# Patient Record
Sex: Male | Born: 1990
Health system: Southern US, Community
[De-identification: ages and names within clinical notes are randomized; demographics above are authoritative.]

## PROBLEM LIST (undated history)

## (undated) ENCOUNTER — Emergency Department (HOSPITAL_COMMUNITY): Admission: EM | Payer: BC Managed Care – PPO

## (undated) DIAGNOSIS — T8859XA Other complications of anesthesia, initial encounter: Secondary | ICD-10-CM

## (undated) DIAGNOSIS — L03113 Cellulitis of right upper limb: Secondary | ICD-10-CM

## (undated) DIAGNOSIS — L02419 Cutaneous abscess of limb, unspecified: Secondary | ICD-10-CM

## (undated) DIAGNOSIS — N200 Calculus of kidney: Secondary | ICD-10-CM

## (undated) DIAGNOSIS — J40 Bronchitis, not specified as acute or chronic: Secondary | ICD-10-CM

## (undated) HISTORY — PX: HAND SURGERY: SHX662

## (undated) HISTORY — PX: KIDNEY STONE SURGERY: SHX686

---
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
## 2011-08-12 ENCOUNTER — Encounter (HOSPITAL_COMMUNITY): Payer: Self-pay | Admitting: *Deleted

## 2011-08-12 ENCOUNTER — Emergency Department (HOSPITAL_COMMUNITY)
Admission: EM | Admit: 2011-08-12 | Discharge: 2011-08-12 | Disposition: A | Discharge status: Home / Self Care | Payer: BC Managed Care – PPO | Attending: Emergency Medicine | Admitting: Emergency Medicine

## 2011-08-12 DIAGNOSIS — R000 Tachycardia, unspecified: Secondary | ICD-10-CM

## 2011-08-12 DIAGNOSIS — J069 Acute upper respiratory infection, unspecified: Secondary | ICD-10-CM | POA: Insufficient documentation

## 2011-08-12 DIAGNOSIS — Z87891 Personal history of nicotine dependence: Secondary | ICD-10-CM | POA: Insufficient documentation

## 2011-08-12 MED ORDER — AZITHROMYCIN 250 MG PO TABS: 250.0000 mg | ORAL_TABLET | Freq: Every day | ORAL | Status: DC

## 2011-08-12 MED ORDER — ACETAMINOPHEN 500 MG PO TABS
1000.0000 mg | ORAL_TABLET | Freq: Once | ORAL | Status: AC
  Administered 2011-08-12: 1000 mg via ORAL
  Filled 2011-08-12: qty 2

## 2011-08-12 NOTE — ED Notes (Signed)
Pt reports productive cough, subjective fever, body aches, mild nausea, chest and nasal congestion since Friday. Pt provided with face mask.

## 2011-08-12 NOTE — ED Provider Notes (Signed)
History     CSN: [id number]  Arrival date & time 08/12/11  1158   First MD Initiated Contact with Patient 08/12/11 1355      Chief Complaint  Patient presents with  . Cough  . Generalized Body Aches  . Fever  . Nasal Congestion    (Consider location/radiation/quality/duration/timing/severity/associated sxs/prior treatment) Patient is a 20 y.o. male presenting with cough and fever. The history is provided by the patient.  Cough This is a new problem. The current episode started more than 2 days ago. The problem occurs constantly. The problem has been gradually worsening. The cough is non-productive. The maximum temperature recorded prior to his arrival was 101 to 101.9 F. Associated symptoms include chills, rhinorrhea, sore throat and myalgias. Pertinent negatives include no chest pain and no shortness of breath. He is a smoker.  Fever Primary symptoms of the febrile illness include fever, cough, nausea and myalgias. Primary symptoms do not include shortness of breath, abdominal pain, vomiting, dysuria or rash.    History reviewed. No pertinent past medical history.  History reviewed. No pertinent past surgical history.  History reviewed. No pertinent family history.  History  Substance Use Topics  . Smoking status: Former Games developer  . Smokeless tobacco: Not on file  . Alcohol Use: No      Review of Systems  Constitutional: Positive for fever and chills.  HENT: Positive for congestion, sore throat, rhinorrhea and sinus pressure. Negative for trouble swallowing.   Eyes: Negative for discharge.  Respiratory: Positive for cough. Negative for shortness of breath.   Cardiovascular: Negative for chest pain.  Gastrointestinal: Positive for nausea. Negative for vomiting and abdominal pain.  Genitourinary: Negative for dysuria.  Musculoskeletal: Positive for myalgias.  Skin: Negative for rash.    Allergies  Review of patient's allergies indicates no known allergies.  Home  Medications   Current Outpatient Rx  Name Route Sig Dispense Refill  . ALBUTEROL SULFATE HFA 108 (90 BASE) MCG/ACT IN AERS Inhalation Inhale 2 puffs into the lungs every 6 (six) hours as needed.    . GUAIFENESIN ER 600 MG PO TB12 Oral Take 1,200 mg by mouth 2 (two) times daily.    Marland Kitchen HALLS COUGH DROPS MT LOZG Mouth/Throat Use as directed 20 lozenges in the mouth or throat as needed. For cough      BP 134/79  Pulse 129  Temp(Src) 100.3 F (37.9 C) (Oral)  Resp 18  SpO2 99%  Physical Exam  Constitutional: He appears well-developed and well-nourished.  HENT:  Head: Normocephalic.  Right Ear: External ear normal.  Left Ear: External ear normal.  Nose: Mucosal edema present. Right sinus exhibits frontal sinus tenderness. Left sinus exhibits frontal sinus tenderness.  Mouth/Throat: Oropharynx is clear and moist.  Neck: Normal range of motion. Neck supple.  Cardiovascular: Normal rate and normal heart sounds.   No murmur heard. Pulmonary/Chest: Effort normal and breath sounds normal. He has no wheezes. He has no rales.  Abdominal: Soft. Bowel sounds are normal. He exhibits no distension. There is no tenderness.  Musculoskeletal: Normal range of motion.  Lymphadenopathy:    He has no cervical adenopathy.  Skin: Skin is warm and dry. No pallor.    ED Course  Procedures (including critical care time)  Labs Reviewed - No data to display No results found.   No diagnosis found. 1. URI   MDM  He has persistent tachycardia despite that he is eating and drinking in the room. No history of vomiting, or diarrhea and is  not complaining of pain. There is no chest pain or shortness of breath - doubt PE. He is otherwise healthy without risk factors. Feel elevated heart rate is likely due to low grade temperature without other concern for underlying conditions.      Rodena Medin, PA-C 08/12/11 1535

## 2011-08-12 NOTE — ED Notes (Signed)
Pt c/o weakness, nasal congestion, nausea, dry cough, and chills that started Friday.  Pt denies vomiting and is able to keep down fluids and food.

## 2011-08-12 NOTE — Discharge Instructions (Signed)
RETURN HERE WITH ANY NEW OR WORSENING SYMPTOMS. TAKE ANTIBIOTIC UNTIL GONE. PUSH FLUIDS.   Upper Respiratory Infection, Adult An upper respiratory infection (URI) is also known as the common cold. It is often caused by a type of germ (virus). Colds are easily spread (contagious). You can pass it to others by kissing, coughing, sneezing, or drinking out of the same glass. Usually, you get better in 1 or 2 weeks.  HOME CARE   Only take medicine as told by your doctor.   Use a warm mist humidifier or breathe in steam from a hot shower.   Drink enough water and fluids to keep your pee (urine) clear or pale yellow.   Get plenty of rest.   Return to work when your temperature is back to normal or as told by your doctor. You may use a face mask and wash your hands to stop your cold from spreading.  GET HELP RIGHT AWAY IF:   After the first few days, you feel you are getting worse.   You have questions about your medicine.   You have chills, shortness of breath, or brown or red spit (mucus).   You have yellow or brown snot (nasal discharge) or pain in the face, especially when you bend forward.   You have a fever, puffy (swollen) neck, pain when you swallow, or white spots in the back of your throat.   You have a bad headache, ear pain, sinus pain, or chest pain.   You have a high-pitched whistling sound when you breathe in and out (wheezing).   You have a lasting cough or cough up blood.   You have sore muscles or a stiff neck.  MAKE SURE YOU:   Understand these instructions.   Will watch your condition.   Will get help right away if you are not doing well or get worse.  Document Released: 10/23/2007 Document Revised: 04/25/2011 Document Reviewed: 09/10/2010 [facility] Patient Information 2012 [place], [place].

## 2011-08-12 NOTE — ED Provider Notes (Signed)
Medical screening examination/treatment/procedure(s) were performed by non-physician practitioner and as supervising physician I was immediately available for consultation/collaboration.  Cheri Guppy, MD 08/12/11 [phone]

## 2011-08-13 ENCOUNTER — Encounter (HOSPITAL_COMMUNITY): Payer: Self-pay | Admitting: *Deleted

## 2011-08-13 ENCOUNTER — Other Ambulatory Visit: Payer: Self-pay

## 2011-08-13 ENCOUNTER — Emergency Department (HOSPITAL_COMMUNITY)
Admission: EM | Admit: 2011-08-13 | Discharge: 2011-08-13 | Disposition: A | Discharge status: Home / Self Care | Payer: BC Managed Care – PPO | Attending: Emergency Medicine | Admitting: Emergency Medicine

## 2011-08-13 DIAGNOSIS — J069 Acute upper respiratory infection, unspecified: Secondary | ICD-10-CM | POA: Insufficient documentation

## 2011-08-13 DIAGNOSIS — Z87891 Personal history of nicotine dependence: Secondary | ICD-10-CM | POA: Insufficient documentation

## 2011-08-13 DIAGNOSIS — J40 Bronchitis, not specified as acute or chronic: Secondary | ICD-10-CM | POA: Insufficient documentation

## 2011-08-13 MED ORDER — HYDROCOD POLST-CHLORPHEN POLST 10-8 MG/5ML PO LQCR: 5.0000 mL | Freq: Two times a day (BID) | ORAL | Status: DC

## 2011-08-13 NOTE — ED Notes (Signed)
Pt states "seen here yesterday & told had an URI, today I started having chest pain & dizziness"; pt indicates cp is mid-sternal

## 2011-08-13 NOTE — Discharge Instructions (Signed)
Bronchitis Bronchitis is a problem of the air tubes leading to your lungs. This problem makes it hard for air to get in and out of the lungs. You may cough a lot because your air tubes are narrow. Going without care can cause lasting (chronic) bronchitis. HOME CARE   Drink enough fluids to keep your pee (urine) clear or pale yellow.   Use a cool mist humidifier.   Quit smoking if you smoke. If you keep smoking, the bronchitis might not get better.   Only take medicine as told by your doctor.  GET HELP RIGHT AWAY IF:   Coughing keeps you awake.   You start to wheeze.   You become more sick or weak.   You have a hard time breathing or get short of breath.   You cough up blood.   Coughing lasts more than 2 weeks.   You have a fever.   Your baby is older than 3 months with a rectal temperature of 102 F (38.9 C) or higher.   Your baby is 59 months old or younger with a rectal temperature of 100.4 F (38 C) or higher.  MAKE SURE YOU:  Understand these instructions.   Will watch your condition.   Will get help right away if you are not doing well or get worse.  Document Released: 10/23/2007 Document Revised: 04/25/2011 Document Reviewed: 04/07/2009 [facility] Patient Information 2012 [place], [place]. Please continue to use the azithromycin prescribed yesterday.  You have an inhaler for the next 2 days.  Would like you to use the inhaler every 4-6 hours 2 puffs around-the-clock if he wake during the night with a coughing episode.  Use it at that time, but did not [place] along clock to wake during the night.  If not needed after the 2 days of regular albuterol use as needed, you also can help with her congestion.  By using Vicks and boiling water to breathing in the vapors is where the saline nose drops

## 2011-08-13 NOTE — ED Provider Notes (Signed)
History     CSN: [id number]  Arrival date & time 08/13/11  1337   First MD Initiated Contact with Patient 08/13/11 1625      Chief Complaint  Patient presents with  . Cough  . Dizziness  . Chest Pain    (Consider location/radiation/quality/duration/timing/severity/associated sxs/prior treatment) HPI Comments: Patient was seen yesterday diagnosed with URI and bronchitis.  He is feels his azithromycin prescription, but is still having a nonproductive cough and today.  He is having more nasal congestion, and dizziness.  He does have an inhaler at her, E. he's in the past for episodes of bronchitis.  He, states she's used it intermittently, but not on regular basis.  He states that when he coughs.  He has pain in his rib area  Patient is a 21 y.o. male presenting with cough and chest pain. The history is provided by the patient.  Cough This is a recurrent problem. The current episode started yesterday. The problem occurs constantly. The problem has not changed since onset.The cough is non-productive. There has been no fever. Associated symptoms include chest pain and rhinorrhea. Pertinent negatives include no ear congestion, no headaches, no shortness of breath and no wheezing.  Chest Pain Primary symptoms include cough and dizziness. Pertinent negatives for primary symptoms include no fever, no shortness of breath and no wheezing.     History reviewed. No pertinent past medical history.  History reviewed. No pertinent past surgical history.  No family history on file.  History  Substance Use Topics  . Smoking status: Former Games developer  . Smokeless tobacco: Not on file  . Alcohol Use: No      Review of Systems  Constitutional: Negative for fever.  HENT: Positive for congestion, rhinorrhea and postnasal drip.   Respiratory: Positive for cough. Negative for shortness of breath and wheezing.   Cardiovascular: Positive for chest pain.  Neurological: Positive for dizziness. Negative  for headaches.    Allergies  Review of patient's allergies indicates no known allergies.  Home Medications   Current Outpatient Rx  Name Route Sig Dispense Refill  . ACETAMINOPHEN 500 MG PO TABS Oral Take 500 mg by mouth every 6 (six) hours as needed. For fever.    . ALBUTEROL SULFATE HFA 108 (90 BASE) MCG/ACT IN AERS Inhalation Inhale 2 puffs into the lungs every 6 (six) hours as needed. For shortness of breath.    . AZITHROMYCIN 250 MG PO TABS Oral Take 250 mg by mouth daily. He is taking for 5 days. He started on 08/12/11 and has not completed.    . GUAIFENESIN ER 600 MG PO TB12 Oral Take 1,200 mg by mouth 2 (two) times daily.    Marland Kitchen HYDROCOD POLST-CPM POLST ER 10-8 MG/5ML PO LQCR Oral Take 5 mLs by mouth every 12 (twelve) hours. 60 mL 0    BP 121/66  Pulse 113  Temp(Src) 99.1 F (37.3 C) (Oral)  Resp 18  Wt 260 lb (117.935 kg)  SpO2 98%  Physical Exam  Constitutional: He is oriented to person, place, and time. He appears well-developed and well-nourished.  HENT:  Head: Normocephalic.  Nose: Rhinorrhea present. No sinus tenderness. Right sinus exhibits maxillary sinus tenderness and frontal sinus tenderness. Left sinus exhibits maxillary sinus tenderness and frontal sinus tenderness.  Mouth/Throat: Uvula is midline.  Eyes: Pupils are equal, round, and reactive to light.  Neck: Normal range of motion.  Cardiovascular: Tachycardia present.   Pulmonary/Chest: No respiratory distress. He has no wheezes. He exhibits tenderness.  Abdominal:  Soft. He exhibits no distension. There is no tenderness.  Musculoskeletal: Normal range of motion.  Neurological: He is alert and oriented to person, place, and time.  Skin: Skin is warm and dry.    ED Course  Procedures (including critical care time)  Labs Reviewed - No data to display No results found.   1. Bronchitis     Patient is requesting something for the cough that is keeping him from sleep  MDM  URI with bronchitis, sinus  congestion        Arman Filter, NP 08/13/11 1655  Arman Filter, NP 08/13/11 1656

## 2011-08-14 NOTE — ED Provider Notes (Signed)
Medical screening examination/treatment/procedure(s) were performed by non-physician practitioner and as supervising physician I was immediately available for consultation/collaboration.  Raeford Razor, MD 08/14/11 0010

## 2012-03-29 ENCOUNTER — Emergency Department (HOSPITAL_COMMUNITY): Payer: Self-pay

## 2012-03-29 ENCOUNTER — Emergency Department (HOSPITAL_COMMUNITY)
Admission: EM | Admit: 2012-03-29 | Discharge: 2012-03-29 | Disposition: A | Discharge status: Home / Self Care | Payer: Self-pay | Attending: Emergency Medicine | Admitting: Emergency Medicine

## 2012-03-29 ENCOUNTER — Encounter (HOSPITAL_COMMUNITY): Payer: Self-pay

## 2012-03-29 DIAGNOSIS — J029 Acute pharyngitis, unspecified: Secondary | ICD-10-CM | POA: Insufficient documentation

## 2012-03-29 DIAGNOSIS — Z87891 Personal history of nicotine dependence: Secondary | ICD-10-CM | POA: Insufficient documentation

## 2012-03-29 DIAGNOSIS — J40 Bronchitis, not specified as acute or chronic: Secondary | ICD-10-CM | POA: Insufficient documentation

## 2012-03-29 DIAGNOSIS — Z79899 Other long term (current) drug therapy: Secondary | ICD-10-CM | POA: Insufficient documentation

## 2012-03-29 MED ORDER — PREDNISONE 20 MG PO TABS: 40.0000 mg | ORAL_TABLET | Freq: Every day | ORAL | Status: DC

## 2012-03-29 MED ORDER — ALBUTEROL SULFATE HFA 108 (90 BASE) MCG/ACT IN AERS
2.0000 | INHALATION_SPRAY | RESPIRATORY_TRACT | Status: DC | PRN
  Administered 2012-03-29: 2 via RESPIRATORY_TRACT
  Filled 2012-03-29: qty 6.7

## 2012-03-29 NOTE — ED Notes (Signed)
Pt. Has had nasal congestion, productive white cough, body aches for 5 days.  Pt . Has a hx of bronchitis

## 2012-03-29 NOTE — ED Provider Notes (Signed)
History  This chart was scribed for Gwyneth Sprout, MD by Erskine Emery. This patient was seen in room TR05C/TR05C and the patient's care was started at 15:46.   CSN: [id number]  Arrival date & time 03/29/12  1508   First MD Initiated Contact with Patient 03/29/12 1546      Chief Complaint  Patient presents with  . Cough    (Consider location/radiation/quality/duration/timing/severity/associated sxs/prior treatment) The history is provided by the patient. No language interpreter was used.  Jacob Nixon is a 21 y.o. male who presents to the Emergency Department complaining of a productive cough, intermittent SOB, difficulty breathing, and cold symptoms for the past 4 days. Pt denies any associated fever. Pt reports a h/o bronchitis and reports this episode feels similar. In the last episode of bronchitis the pt was successfully treated with an inhaler. Pt has no h/o asthma. Pt smokes cigarettes.  History reviewed. No pertinent past medical history.  Past Surgical History  Procedure Date  . Kidney stone surgery     lithortripsy    No family history on file.  History  Substance Use Topics  . Smoking status: Former Games developer  . Smokeless tobacco: Not on file  . Alcohol Use: No      Review of Systems  Constitutional: Negative for fever and chills.  HENT: Positive for sore throat and rhinorrhea.   Respiratory: Positive for cough (productive) and shortness of breath.   Gastrointestinal: Negative for nausea and vomiting.  Genitourinary: Negative for hematuria.  Musculoskeletal: Negative for back pain.  Skin: Negative for rash.  Neurological: Negative for weakness.    Allergies  Review of patient's allergies indicates no known allergies.  Home Medications   Current Outpatient Rx  Name  Route  Sig  Dispense  Refill  . ALBUTEROL SULFATE HFA 108 (90 BASE) MCG/ACT IN AERS   Inhalation   Inhale 2 puffs into the lungs every 6 (six) hours as needed. For shortness of  breath.         . GUAIFENESIN ER 600 MG PO TB12   Oral   Take 600 mg by mouth 2 (two) times daily.          Ronney Asters COLD/FLU SEVERE DAY PO   Oral   Take 2 capsules by mouth every 6 (six) hours as needed. For congestion, coughing and fever           BP 129/77  Pulse 109  Temp 99.5 F (37.5 C) (Oral)  Resp 22  Ht 5\' 11"  (1.803 m)  Wt 260 lb (117.935 kg)  BMI 36.26 kg/m2  SpO2 96%  Physical Exam  Nursing note and vitals reviewed. Constitutional: He is oriented to person, place, and time. He appears well-developed and well-nourished. No distress.  HENT:  Head: Normocephalic and atraumatic.  Right Ear: Tympanic membrane and external ear normal.  Left Ear: Tympanic membrane and external ear normal.  Mouth/Throat: Oropharynx is clear and moist. No oropharyngeal exudate.  Eyes: EOM are normal. Pupils are equal, round, and reactive to light.  Neck: Neck supple. No tracheal deviation present.  Cardiovascular: Normal rate.   Pulmonary/Chest: Effort normal. No respiratory distress.  Abdominal: Soft. He exhibits no distension.  Musculoskeletal: Normal range of motion. He exhibits no edema.  Neurological: He is alert and oriented to person, place, and time.  Skin: Skin is warm and dry.  Psychiatric: He has a normal mood and affect.    ED Course  Procedures (including critical care time) DIAGNOSTIC STUDIES:.  COORDINATION OF CARE: 16:04--I evaluated the patient and we discussed a treatment plan including inhaler and steroids to which the pt agreed. I notified the pt that his x-ray shows he has bronchitis again.   Labs Reviewed - No data to display Dg Chest 2 View  03/29/2012  *RADIOLOGY REPORT*  Clinical Data: Cough.  Congestion.  Shortness of breath.  CHEST - 2 VIEW  Comparison: None.  Findings: Heart size is normal.  Mediastinal shadows are normal. There may be mild central bronchial thickening but there is no infiltrate, collapse or effusion.  No bony abnormality.   IMPRESSION: Possible bronchitis.  No consolidation or collapse.   Original Report Authenticated By: Paulina Fusi, M.D.      1. Bronchitis       MDM   Pt with symptoms consistent with viral URI/bronchitis.  Well appearing here.  No signs of breathing difficulty here but pt states wheezing and SOB intermittently and he is a smoker.  No signs of pharyngitis, otitis or abnormal abdominal findings.   CXR shows bronchitis.  Will treat with steroids and inhaler and have pt return if sx worsening.       I personally performed the services described in this documentation, which was scribed in my presence.  The recorded information has been reviewed and considered.    Gwyneth Sprout, MD 03/29/12 1616

## 2012-04-03 ENCOUNTER — Encounter (HOSPITAL_COMMUNITY): Payer: Self-pay | Admitting: Emergency Medicine

## 2012-04-03 ENCOUNTER — Emergency Department (HOSPITAL_COMMUNITY)
Admission: EM | Admit: 2012-04-03 | Discharge: 2012-04-03 | Disposition: A | Discharge status: Home / Self Care | Payer: Self-pay | Attending: Emergency Medicine | Admitting: Emergency Medicine

## 2012-04-03 DIAGNOSIS — Z79899 Other long term (current) drug therapy: Secondary | ICD-10-CM | POA: Insufficient documentation

## 2012-04-03 DIAGNOSIS — R0602 Shortness of breath: Secondary | ICD-10-CM | POA: Insufficient documentation

## 2012-04-03 DIAGNOSIS — J45909 Unspecified asthma, uncomplicated: Secondary | ICD-10-CM

## 2012-04-03 DIAGNOSIS — Z87891 Personal history of nicotine dependence: Secondary | ICD-10-CM | POA: Insufficient documentation

## 2012-04-03 DIAGNOSIS — IMO0002 Reserved for concepts with insufficient information to code with codable children: Secondary | ICD-10-CM | POA: Insufficient documentation

## 2012-04-03 DIAGNOSIS — R0789 Other chest pain: Secondary | ICD-10-CM | POA: Insufficient documentation

## 2012-04-03 DIAGNOSIS — J40 Bronchitis, not specified as acute or chronic: Secondary | ICD-10-CM | POA: Insufficient documentation

## 2012-04-03 DIAGNOSIS — J45901 Unspecified asthma with (acute) exacerbation: Secondary | ICD-10-CM | POA: Insufficient documentation

## 2012-04-03 LAB — BASIC METABOLIC PANEL
CO2: 23 mEq/L (ref 19–32)
Calcium: 9.4 mg/dL (ref 8.4–10.5)
Chloride: 103 mEq/L (ref 96–112)
Glucose, Bld: 102 mg/dL — ABNORMAL HIGH (ref 70–99)
Potassium: 4 mEq/L (ref 3.5–5.1)
Sodium: 137 mEq/L (ref 135–145)

## 2012-04-03 LAB — D-DIMER, QUANTITATIVE: D-Dimer, Quant: <0.27 ug/mL-FEU (ref 0.00–0.48)

## 2012-04-03 LAB — CBC WITH DIFFERENTIAL/PLATELET
Basophils Absolute: 0 10*3/uL (ref 0.0–0.1)
Eosinophils Relative: 3 % (ref 0–5)
HCT: 42.2 % (ref 39.0–52.0)
Lymphocytes Relative: 35 % (ref 12–46)
Lymphs Abs: 4.1 10*3/uL — ABNORMAL HIGH (ref 0.7–4.0)
MCV: 84.2 fL (ref 78.0–100.0)
Neutro Abs: 6.3 10*3/uL (ref 1.7–7.7)
Platelets: 324 10*3/uL (ref 150–400)
RBC: 5.01 MIL/uL (ref 4.22–5.81)
WBC: 11.7 10*3/uL — ABNORMAL HIGH (ref 4.0–10.5)

## 2012-04-03 LAB — TROPONIN I: Troponin I: <0.3 ng/mL (ref <0.30)

## 2012-04-03 MED ORDER — ALBUTEROL SULFATE HFA 108 (90 BASE) MCG/ACT IN AERS
2.0000 | INHALATION_SPRAY | Freq: Once | RESPIRATORY_TRACT | Status: AC
  Administered 2012-04-03: 2 via RESPIRATORY_TRACT
  Filled 2012-04-03: qty 6.7

## 2012-04-03 MED ORDER — IPRATROPIUM BROMIDE 0.02 % IN SOLN
0.5000 mg | RESPIRATORY_TRACT | Status: DC
  Administered 2012-04-03: 0.5 mg via RESPIRATORY_TRACT
  Filled 2012-04-03: qty 2.5

## 2012-04-03 MED ORDER — ALBUTEROL SULFATE (5 MG/ML) 0.5% IN NEBU
2.5000 mg | INHALATION_SOLUTION | RESPIRATORY_TRACT | Status: DC
  Administered 2012-04-03: 2.5 mg via RESPIRATORY_TRACT
  Filled 2012-04-03: qty 0.5

## 2012-04-03 MED ORDER — HYDROCODONE-HOMATROPINE 5-1.5 MG/5ML PO SYRP: 5.0000 mL | ORAL_SOLUTION | Freq: Four times a day (QID) | ORAL | Status: DC | PRN

## 2012-04-03 NOTE — ED Notes (Signed)
Patient is alert and orientedx4.  Patient was explained discharge instructions and he had no questions.   

## 2012-04-03 NOTE — ED Notes (Signed)
Patient advised he was here last week and was diagnosed with bronchitis.  He was given an inhaler and he is out.  Patient says he feels like he cannot take a full deep breath.  Patient does sound congested but says he has a dry cough.  Patient also said his left arm feels like it is "falling asleep".  Patient says he googled information on the symptoms he feels in his arm and it concerned him that some signs of stroke are similar to what he is feeling in his left arm.

## 2012-04-03 NOTE — ED Notes (Signed)
Pt. Was in ED on Sunday given albuterol and prednisone for bronchitis symptoms.  Pt. Doesn't feel that medication has helped.  Medication has run out and cough and breathing symptoms still remain. Pt has nasal congestion and cough.  Denies production with cough.

## 2012-04-05 NOTE — ED Provider Notes (Signed)
History     CSN: [id number]  Arrival date & time 04/03/12  4401   First MD Initiated Contact with Patient 04/03/12 2007      Chief Complaint  Patient presents with  . Cough  . Shortness of Breath    (Consider location/radiation/quality/duration/timing/severity/associated sxs/prior treatment) HPI Comments: Jacob Nixon 21 y.o. male   The chief complaint is: Patient presents with:   Cough   Shortness of Breath    21 year old male presents with c/o shortness of breath and nonproductive cough.  He was seen 5 days ago. cxr showed bronchitis.  Tonight c/o left-sided chest pressure and a nagging pain in his left shoulder.  He complains of left hand paresthesias.Onset yesterday morning and constant; no pain, nausea, vomiting,or diaphoresis. Denies history of pulmonary embolism or DVT.  Denies history of neck pain or radiculopathy . Patient is a current smoker (Hookah).  Denies a family history of early MI or stroke.  He denies history of diabetes.  Patient is obese.  Given an inhaler and prednisone at last visit w/o relief.  He has used the entire inhaler in 4 days.  Awakens with cough, wheezing frequently at night;     Denies fevers, chills, myalgias, arthralgias. Denies dysuria, flank pain, suprapubic pain, frequency, urgency, or hematuria. Denies headaches, light headedness, weakness, visual disturbances. Denies abdominal pain, nausea, vomiting, diarrhea or constipation.       The history is provided by the patient. No language interpreter was used.    History reviewed. No pertinent past medical history.  Past Surgical History  Procedure Date  . Kidney stone surgery     lithortripsy    No family history on file.  History  Substance Use Topics  . Smoking status: Former Games developer  . Smokeless tobacco: Not on file  . Alcohol Use: No      Review of Systems  All other systems reviewed and are negative.  except as stated in HPI.  Allergies  Review of patient's  allergies indicates no known allergies.  Home Medications   Current Outpatient Rx  Name  Route  Sig  Dispense  Refill  . ALBUTEROL SULFATE HFA 108 (90 BASE) MCG/ACT IN AERS   Inhalation   Inhale 2 puffs into the lungs every 6 (six) hours as needed. For shortness of breath.         . IBUPROFEN 200 MG PO TABS   Oral   Take 600 mg by mouth every 6 (six) hours as needed. For pain         . PREDNISONE 20 MG PO TABS   Oral   Take 2 tablets (40 mg total) by mouth daily.   10 tablet   0     BP 127/68  Pulse 72  Temp 97.9 F (36.6 C) (Oral)  Resp 18  SpO2 93%  Physical Exam  Nursing note and vitals reviewed. Constitutional: He appears well-developed and well-nourished. No distress.  HENT:  Head: Normocephalic and atraumatic.  Eyes: Conjunctivae normal are normal. No scleral icterus.  Neck: Normal range of motion. Neck supple.  Cardiovascular: Normal rate, regular rhythm and normal heart sounds.   Pulmonary/Chest: Effort normal. No respiratory distress. He has wheezes (mild expiratory wheezes).  Abdominal: Soft. There is no tenderness.  Musculoskeletal: He exhibits no edema.  Neurological: He is alert.  Skin: Skin is warm and dry. He is not diaphoretic.  Psychiatric: His behavior is normal.    ED Course  Procedures (including critical care time)  Labs Reviewed -  No data to display No results found.   No diagnosis found.   Date: 04/03/2012  Rate: 77  Rhythm: normal sinus rhythm  QRS Axis: normal  Intervals: normal  ST/T Wave abnormalities: normal  Conduction Disutrbances: none  Narrative Interpretation:   Old EKG Reviewed: none     MDM  Patient's complaints are concerning for ACS versus PE.  However my suspicion is very low considering age and RFs.  I suspect that the patient likely has tightness from reactive airway and bronchitis.  He is on the monitor currently. Normal ECG.  Awaiting troponin I and D. Dimer.   Patient d dimer and troponin Negative.   As onset of sxs was greater than 12 hours I do not feel repeat troponin necessary.  Labs also show mild hyperglycemia.  I will dc paitent with Hycodan syrup and albuterol inhaler.   Discussed reasons to seek immediate care. Patient expresses understanding and agrees with plan.     Arthor Captain, PA-C 04/05/12 1350

## 2012-04-05 NOTE — ED Provider Notes (Signed)
Medical screening examination/treatment/procedure(s) were performed by non-physician practitioner and as supervising physician I was immediately available for consultation/collaboration.   Glynn Octave, MD 04/05/12 2329

## 2012-06-19 ENCOUNTER — Emergency Department (HOSPITAL_COMMUNITY)
Admission: EM | Admit: 2012-06-19 | Discharge: 2012-06-19 | Disposition: A | Discharge status: Home / Self Care | Payer: BC Managed Care – PPO | Attending: Emergency Medicine | Admitting: Emergency Medicine

## 2012-06-19 ENCOUNTER — Encounter (HOSPITAL_COMMUNITY): Payer: Self-pay | Admitting: *Deleted

## 2012-06-19 ENCOUNTER — Emergency Department (HOSPITAL_COMMUNITY): Payer: BC Managed Care – PPO

## 2012-06-19 DIAGNOSIS — R10A0 Flank pain, unspecified side: Secondary | ICD-10-CM

## 2012-06-19 DIAGNOSIS — Z87891 Personal history of nicotine dependence: Secondary | ICD-10-CM | POA: Insufficient documentation

## 2012-06-19 DIAGNOSIS — R110 Nausea: Secondary | ICD-10-CM | POA: Insufficient documentation

## 2012-06-19 DIAGNOSIS — N23 Unspecified renal colic: Secondary | ICD-10-CM

## 2012-06-19 DIAGNOSIS — R109 Unspecified abdominal pain: Secondary | ICD-10-CM | POA: Insufficient documentation

## 2012-06-19 DIAGNOSIS — Z87442 Personal history of urinary calculi: Secondary | ICD-10-CM | POA: Insufficient documentation

## 2012-06-19 HISTORY — DX: Calculus of kidney: N20.0

## 2012-06-19 LAB — POCT I-STAT, CHEM 8
Chloride: 102 mEq/L (ref 96–112)
Glucose, Bld: 117 mg/dL — ABNORMAL HIGH (ref 70–99)
HCT: 47 % (ref 39.0–52.0)
Hemoglobin: 16 g/dL (ref 13.0–17.0)
Potassium: 3.5 mEq/L (ref 3.5–5.1)
Sodium: 139 mEq/L (ref 135–145)

## 2012-06-19 LAB — URINALYSIS, ROUTINE W REFLEX MICROSCOPIC
Leukocytes, UA: NEGATIVE
Nitrite: NEGATIVE
Specific Gravity, Urine: 1.022 (ref 1.005–1.030)
Urobilinogen, UA: 1 mg/dL (ref 0.0–1.0)
pH: 6 (ref 5.0–8.0)

## 2012-06-19 MED ORDER — OXYCODONE-ACETAMINOPHEN 5-325 MG PO TABS
2.0000 | ORAL_TABLET | Freq: Once | ORAL | Status: AC
  Administered 2012-06-19: 2 via ORAL
  Filled 2012-06-19: qty 2

## 2012-06-19 MED ORDER — IBUPROFEN 800 MG PO TABS: 800.0000 mg | ORAL_TABLET | Freq: Three times a day (TID) | ORAL | Status: DC | PRN

## 2012-06-19 MED ORDER — KETOROLAC TROMETHAMINE 30 MG/ML IJ SOLN
30.0000 mg | Freq: Once | INTRAMUSCULAR | Status: AC
  Administered 2012-06-19: 30 mg via INTRAMUSCULAR
  Filled 2012-06-19: qty 1

## 2012-06-19 NOTE — ED Notes (Signed)
Per pt, approx a week ago pt started having left flank pain, and pain started to radiate around his left side. Pt states that the pain has gotten worse. Pt states he has a hx of Kidney stones and this is the same type of pain as his previous stones.

## 2012-06-19 NOTE — ED Notes (Addendum)
Lt. Flank pain and radiates around to belly.  Urine darker than normal

## 2012-06-19 NOTE — ED Notes (Signed)
Pt ambulated to bathroom for urine specimen.

## 2012-06-19 NOTE — ED Provider Notes (Signed)
History     CSN: [id number]  Arrival date & time 06/19/12  0104   First MD Initiated Contact with Patient 06/19/12 0113      Chief Complaint  Patient presents with  . Flank Pain   HPI  History provided by the patient. Patient is a 22 year old male with past history of kidney stone who presents with complaints of left flank pain similar to previous episodes of kidney stone. Symptoms first began last Saturday. Pain was waxing and waning but became more persistent today. Pain radiates some around to the left abdomen area. Patient has not noticed any frank blood in urine but did notice some darker urine. He reports significant family history of kidney stones as well. Symptoms have been associated with some nausea but no vomiting. No fever, chills or sweats. Patient has not taken anything for his symptoms. He denies any other aggravating or alleviating factors.     Past Medical History  Diagnosis Date  . Kidney stones     Past Surgical History  Procedure Date  . Kidney stone surgery     lithortripsy    No family history on file.  History  Substance Use Topics  . Smoking status: Former Games developer  . Smokeless tobacco: Not on file  . Alcohol Use: No      Review of Systems  Constitutional: Negative for fever and chills.  Gastrointestinal: Positive for nausea. Negative for vomiting, diarrhea and constipation.  Genitourinary: Positive for flank pain. Negative for dysuria, frequency and hematuria.  All other systems reviewed and are negative.    Allergies  Review of patient's allergies indicates no known allergies.  Home Medications   Current Outpatient Rx  Name  Route  Sig  Dispense  Refill  . ALBUTEROL SULFATE HFA 108 (90 BASE) MCG/ACT IN AERS   Inhalation   Inhale 2 puffs into the lungs every 6 (six) hours as needed. For shortness of breath.         Marland Kitchen HYDROCODONE-HOMATROPINE 5-1.5 MG/5ML PO SYRP   Oral   Take 5 mLs by mouth every 6 (six) hours as needed for  cough.   120 mL   0   . IBUPROFEN 200 MG PO TABS   Oral   Take 600 mg by mouth every 6 (six) hours as needed. For pain         . PREDNISONE 20 MG PO TABS   Oral   Take 2 tablets (40 mg total) by mouth daily.   10 tablet   0     BP 120/79  Pulse 101  Temp 98.9 F (37.2 C) (Oral)  Resp 20  SpO2 96%  Physical Exam  Nursing note and vitals reviewed. Constitutional: He is oriented to person, place, and time. He appears well-developed and well-nourished.  HENT:  Head: Normocephalic.  Cardiovascular: Normal rate and regular rhythm.   Pulmonary/Chest: Effort normal and breath sounds normal.  Abdominal: Soft. There is no tenderness. There is no rebound and no guarding.       Left CVA tenderness. Patient is obese.  Neurological: He is alert and oriented to person, place, and time.  Skin: Skin is warm.  Psychiatric: He has a normal mood and affect. His behavior is normal.    ED Course  Procedures   Results for orders placed during the hospital encounter of 06/19/12  URINALYSIS, ROUTINE W REFLEX MICROSCOPIC      Component Value Range   Color, Urine YELLOW  YELLOW   APPearance CLEAR  CLEAR  Specific Gravity, Urine 1.022  1.005 - 1.030   pH 6.0  5.0 - 8.0   Glucose, UA NEGATIVE  NEGATIVE mg/dL   Hgb urine dipstick NEGATIVE  NEGATIVE   Bilirubin Urine NEGATIVE  NEGATIVE   Ketones, ur NEGATIVE  NEGATIVE mg/dL   Protein, ur NEGATIVE  NEGATIVE mg/dL   Urobilinogen, UA 1.0  0.0 - 1.0 mg/dL   Nitrite NEGATIVE  NEGATIVE   Leukocytes, UA NEGATIVE  NEGATIVE  POCT I-STAT, CHEM 8      Component Value Range   Sodium 139  135 - 145 mEq/L   Potassium 3.5  3.5 - 5.1 mEq/L   Chloride 102  96 - 112 mEq/L   BUN 7  6 - 23 mg/dL   Creatinine, Ser 1.61  0.50 - 1.35 mg/dL   Glucose, Bld 096 (*) 70 - 99 mg/dL   Calcium, Ion 0.45  4.09 - 1.23 mmol/L   TCO2 24  0 - 100 mmol/L   Hemoglobin 16.0  13.0 - 17.0 g/dL   HCT 81.1  91.4 - 78.2 %       Ct Abdomen Pelvis Wo  Contrast  06/19/2012  *RADIOLOGY REPORT*  Clinical Data: Left flank pain  CT ABDOMEN AND PELVIS WITHOUT CONTRAST  Technique:  Multidetector CT imaging of the abdomen and pelvis was performed following the standard protocol without intravenous contrast.  Comparison: None.  Findings: Limited images through the lung bases demonstrate no significant appreciable abnormality. The heart size is within normal limits. No pleural or pericardial effusion.  Organ abnormality/lesion detection is limited in the absence of intravenous contrast. Within this limitation, unremarkable liver, biliary system, spleen, pancreas, adrenal glands.  Symmetric renal size.  Tiny nonobstructing upper pole stone on each side.  No ureteral calculi.  No hydronephrosis or hydroureter.  No CT evidence for colitis.  Appendix within normal limits.  No free intraperitoneal air or fluid.  Normal course and caliber small bowel loops.  Decompressed bladder.  Small fat containing left inguinal hernia.  No acute osseous finding.  IMPRESSION: Tiny nonobstructing upper pole stone within each kidney.  No hydronephrosis or ureteral calculi.   Original Report Authenticated By: Jearld Lesch, M.D.      1. Flank pain   2. Ureteral colic       MDM  1:30AM patient seen and evaluated. Patient appears in mild discomfort. Symptoms are described as the same as similar kidney stones. UA, i-STAT chem 8 and CT pending. Pain medicines ordered.  Patient having some improvement of pains after medications. Lab tests and urine tests are normal. CT scan pending.   CT scan is unremarkable. No signs of obstructing kidney stone or hydronephrosis. No other acute or concerning findings to explain symptoms today. At this time we'll recommend symptomatic treatment with ibuprofen and right urology referral.      Angus Seller, PA 06/19/12 0530

## 2012-06-19 NOTE — ED Notes (Signed)
Pt returned from CT °

## 2012-06-19 NOTE — ED Provider Notes (Signed)
Medical screening examination/treatment/procedure(s) were performed by non-physician practitioner and as supervising physician I was immediately available for consultation/collaboration.  Jasmine Awe, MD 06/19/12 [phone]

## 2012-06-19 NOTE — ED Notes (Signed)
Pt transported to CT ?

## 2012-07-04 ENCOUNTER — Emergency Department (HOSPITAL_COMMUNITY)
Admission: EM | Admit: 2012-07-04 | Discharge: 2012-07-04 | Disposition: A | Discharge status: Home / Self Care | Payer: BC Managed Care – PPO | Attending: Emergency Medicine | Admitting: Emergency Medicine

## 2012-07-04 ENCOUNTER — Emergency Department (HOSPITAL_COMMUNITY): Payer: BC Managed Care – PPO

## 2012-07-04 ENCOUNTER — Encounter (HOSPITAL_COMMUNITY): Payer: Self-pay | Admitting: *Deleted

## 2012-07-04 DIAGNOSIS — Y92009 Unspecified place in unspecified non-institutional (private) residence as the place of occurrence of the external cause: Secondary | ICD-10-CM | POA: Insufficient documentation

## 2012-07-04 DIAGNOSIS — X500XXA Overexertion from strenuous movement or load, initial encounter: Secondary | ICD-10-CM | POA: Insufficient documentation

## 2012-07-04 DIAGNOSIS — Z87442 Personal history of urinary calculi: Secondary | ICD-10-CM | POA: Insufficient documentation

## 2012-07-04 DIAGNOSIS — Z87891 Personal history of nicotine dependence: Secondary | ICD-10-CM | POA: Insufficient documentation

## 2012-07-04 DIAGNOSIS — Y9389 Activity, other specified: Secondary | ICD-10-CM | POA: Insufficient documentation

## 2012-07-04 DIAGNOSIS — S63509A Unspecified sprain of unspecified wrist, initial encounter: Secondary | ICD-10-CM | POA: Insufficient documentation

## 2012-07-04 MED ORDER — IBUPROFEN 600 MG PO TABS: 600.0000 mg | ORAL_TABLET | Freq: Four times a day (QID) | ORAL | Status: DC | PRN

## 2012-07-04 NOTE — ED Notes (Signed)
Pt states yesterday he was cleaning and moving stuff when he heard a pop, afterwards started swelling, states having a throbbing/achy pain now but last night had a sharp pain that woke him up. R wrist visibly swollen.

## 2012-07-04 NOTE — ED Provider Notes (Signed)
History  This chart was scribed for non-physician practitioner, Renne Crigler, PA working with Donnetta Hutching, MD by Magnus Sinning, ED Scribe. This patient was seen in room WTR6/WTR6 and the patient's care was started at 19:26.   CSN: [id number]  Arrival date & time 07/04/12  1851   Chief Complaint  Patient presents with  . Joint Swelling  . Wrist Injury    (Consider location/radiation/quality/duration/timing/severity/associated sxs/prior treatment) Patient is a 22 y.o. male presenting with wrist injury. The history is provided by the patient. No language interpreter was used.  Wrist Injury Associated symptoms: no back pain and no neck pain    Jacob Nixon is a 22 y.o. male who presents to the Emergency Department complaining of constant moderate right wrist swelling with associated pain, onset yesterday.  He states he was moving furniture around his home yesterday when he felt a pop. He says he didn't think much of it since he experiences "joint popping" regularly. He says he did feel a sharp pain however immediately after and notes after taking a nap he woke up and noticed swelling and more severe pain.  He does not have any other musculoskeletal complaints and he notes he has taken ibuprofen and wrapped it with no relief.  He says he has similar sxs with same wrist prior and relative notes he was treated for a staph infection for that episode.   The onset of this condition was acute. The course is constant. Aggravating factors: palpation. Alleviating factors: none.    Past Medical History  Diagnosis Date  . Kidney stones     Past Surgical History  Procedure Laterality Date  . Kidney stone surgery      lithortripsy    No family history on file.  History  Substance Use Topics  . Smoking status: Former Games developer  . Smokeless tobacco: Not on file  . Alcohol Use: No      Review of Systems  Constitutional: Negative for activity change.  HENT: Negative for neck pain.    Musculoskeletal: Positive for joint swelling and arthralgias. Negative for back pain and gait problem.       Right wrist pain and swelling  Skin: Negative for wound.  Neurological: Negative for weakness and numbness.  All other systems reviewed and are negative.    Allergies  Review of patient's allergies indicates no known allergies.  Home Medications   Current Outpatient Rx  Name  Route  Sig  Dispense  Refill  . ibuprofen (ADVIL,MOTRIN) 800 MG tablet   Oral   Take 1 tablet (800 mg total) by mouth every 8 (eight) hours as needed for pain.   30 tablet   0     BP 128/78  Pulse 80  Temp(Src) 98 F (36.7 C) (Oral)  Resp 18  SpO2 100%  Physical Exam  Nursing note and vitals reviewed. Constitutional: He appears well-developed and well-nourished. No distress.  HENT:  Head: Normocephalic and atraumatic.  Eyes: Conjunctivae and EOM are normal.  Neck: Normal range of motion. Neck supple. No tracheal deviation present.  Cardiovascular: Normal rate and normal pulses.   Pulses:      Radial pulses are 2+ on the right side, and 2+ on the left side.  Pulmonary/Chest: Effort normal. No respiratory distress.  Abdominal: He exhibits no distension.  Musculoskeletal: Normal range of motion. He exhibits tenderness. He exhibits no edema.  Nml sensation and cap refill. Tenderness to palpation over the distal ulna with soft tissue swelling.  Nml full active ROM  of the right wrist. No overlying erythema or signs of cellulitis. No lymphangitis.   Neurological: He is alert. No sensory deficit.  Motor, sensation, and vascular distal to the injury is fully intact.   Skin: Skin is warm and dry.  Psychiatric: He has a normal mood and affect. His behavior is normal.    ED Course  Procedures (including critical care time) DIAGNOSTIC STUDIES: Oxygen Saturation is 100% on room air, normal by my interpretation.    COORDINATION OF CARE: 19:28: Physical exam performed.  Labs Reviewed - No data  to display  Dg Wrist Complete Right  07/04/2012  *RADIOLOGY REPORT*  Clinical Data: Joint swelling.  Wrist injury  RIGHT WRIST - COMPLETE 3+ VIEW  Comparison: None.  Findings: There is no evidence of fracture or dislocation.  There is no evidence of arthropathy or other focal bone abnormality. Soft tissues are unremarkable.  IMPRESSION: Negative examination.   Original Report Authenticated By: Signa Kell, M.D.      1. Wrist sprain     Patient seen and examined. Work-up initiated.   Vital signs reviewed and are as follows: Filed Vitals:   07/04/12 1903  BP: 128/78  Pulse: 80  Temp: 98 F (36.7 C)  Resp: 18   X-ray results reviewed by myself. Patient informed. Wrist splint by orthopedic tech.  Pt urged to return with worsening pain, worsening swelling, expanding area of redness or streaking up extremity, fever, or any other concerns. Pt verbalizes understanding and agrees with plan.  Orthopedic followup if not improved or if the condition is chronic.     MDM  Patient with wrist swelling likely after an injury but the patient has had similar swelling in the past with an infection. There is no signs of infection at the current time. No systemic symptoms of illness. No redness, warmth, or lymphangitis. RICE indicated unless condition changes.   I personally performed the services described in this documentation, which was scribed in my presence. The recorded information has been reviewed and is accurate.         Renne Crigler, [place] 07/04/12 2106

## 2012-07-04 NOTE — ED Notes (Signed)
Pt ambulating independently w/ steady gait on d/c in no acute distress, A&Ox4. D/c instructions reviewed w/ pt and family - pt and family deny any further questions or concerns at present. Rx given x1  

## 2012-07-04 NOTE — ED Provider Notes (Signed)
Medical screening examination/treatment/procedure(s) were performed by non-physician practitioner and as supervising physician I was immediately available for consultation/collaboration.  Donnetta Hutching, MD 07/04/12 2222

## 2012-07-12 ENCOUNTER — Encounter (HOSPITAL_COMMUNITY): Payer: Self-pay | Admitting: Emergency Medicine

## 2012-07-12 DIAGNOSIS — R109 Unspecified abdominal pain: Secondary | ICD-10-CM | POA: Insufficient documentation

## 2012-07-12 DIAGNOSIS — F17200 Nicotine dependence, unspecified, uncomplicated: Secondary | ICD-10-CM | POA: Insufficient documentation

## 2012-07-12 DIAGNOSIS — Z87442 Personal history of urinary calculi: Secondary | ICD-10-CM | POA: Insufficient documentation

## 2012-07-12 DIAGNOSIS — Z9889 Other specified postprocedural states: Secondary | ICD-10-CM | POA: Insufficient documentation

## 2012-07-12 LAB — POCT I-STAT, CHEM 8
Calcium, Ion: 1.18 mmol/L (ref 1.12–1.23)
Glucose, Bld: 108 mg/dL — ABNORMAL HIGH (ref 70–99)
HCT: 47 % (ref 39.0–52.0)
Hemoglobin: 16 g/dL (ref 13.0–17.0)

## 2012-07-12 LAB — CBC WITH DIFFERENTIAL/PLATELET
Basophils Absolute: 0 10*3/uL (ref 0.0–0.1)
Basophils Relative: 0 % (ref 0–1)
Eosinophils Relative: 1 % (ref 0–5)
HCT: 44.4 % (ref 39.0–52.0)
MCHC: 35.4 g/dL (ref 30.0–36.0)
MCV: 84.7 fL (ref 78.0–100.0)
Monocytes Absolute: 0.9 10*3/uL (ref 0.1–1.0)
Neutro Abs: 6.6 10*3/uL (ref 1.7–7.7)
RDW: 13 % (ref 11.5–15.5)

## 2012-07-12 LAB — URINALYSIS, ROUTINE W REFLEX MICROSCOPIC
Bilirubin Urine: NEGATIVE
Ketones, ur: NEGATIVE mg/dL
Nitrite: NEGATIVE
Protein, ur: NEGATIVE mg/dL
Urobilinogen, UA: 1 mg/dL (ref 0.0–1.0)

## 2012-07-12 NOTE — ED Notes (Signed)
Patient with increased left flank pain for last week.  Patient does have known kidney stone history.  Patient states he has been nauseated, no vomiting.

## 2012-07-13 ENCOUNTER — Emergency Department (HOSPITAL_COMMUNITY)
Admission: EM | Admit: 2012-07-13 | Discharge: 2012-07-13 | Disposition: A | Discharge status: Home / Self Care | Payer: BC Managed Care – PPO | Attending: Emergency Medicine | Admitting: Emergency Medicine

## 2012-07-13 ENCOUNTER — Emergency Department (HOSPITAL_COMMUNITY): Payer: BC Managed Care – PPO

## 2012-07-13 DIAGNOSIS — R109 Unspecified abdominal pain: Secondary | ICD-10-CM

## 2012-07-13 MED ORDER — HYDROMORPHONE HCL PF 1 MG/ML IJ SOLN
0.5000 mg | Freq: Once | INTRAMUSCULAR | Status: AC
  Administered 2012-07-13: 0.5 mg via INTRAVENOUS
  Filled 2012-07-13: qty 1

## 2012-07-13 MED ORDER — OXYCODONE-ACETAMINOPHEN 5-325 MG PO TABS: 1.0000 | ORAL_TABLET | Freq: Four times a day (QID) | ORAL | Status: DC | PRN

## 2012-07-13 MED ORDER — KETOROLAC TROMETHAMINE 30 MG/ML IJ SOLN
30.0000 mg | Freq: Once | INTRAMUSCULAR | Status: AC
  Administered 2012-07-13: 30 mg via INTRAVENOUS
  Filled 2012-07-13: qty 1

## 2012-07-13 NOTE — ED Provider Notes (Signed)
History     CSN: [id number]  Arrival date & time 07/12/12  2321   First MD Initiated Contact with Patient 07/13/12 0107      Chief Complaint  Patient presents with  . Flank Pain    (Consider location/radiation/quality/duration/timing/severity/associated sxs/prior treatment) Patient is a 22 y.o. male presenting with flank pain.  Flank Pain Pertinent negatives include no chest pain, no abdominal pain, no headaches and no shortness of breath.   patient presents with left posterior flank pain. States it feels like his previous kidney stones. He was seen 3 weeks ago for the same and had a CT scan showed renal stones without ureteral stones. He states he feels as if he has to urinate. The pain is worse with movement. It comes and goes. He gets cramps of severe pain. No nausea vomiting diarrhea. No fevers. No trauma. No cough. No weight loss. No testicular pain. No rash.  Past Medical History  Diagnosis Date  . Kidney stones     Past Surgical History  Procedure Laterality Date  . Kidney stone surgery      lithortripsy    History reviewed. No pertinent family history.  History  Substance Use Topics  . Smoking status: Current Every Day Smoker  . Smokeless tobacco: Not on file  . Alcohol Use: No      Review of Systems  Constitutional: Negative for activity change and appetite change.  HENT: Negative for neck stiffness.   Eyes: Negative for pain.  Respiratory: Negative for chest tightness and shortness of breath.   Cardiovascular: Negative for chest pain and leg swelling.  Gastrointestinal: Negative for nausea, vomiting, abdominal pain and diarrhea.  Genitourinary: Positive for flank pain.  Musculoskeletal: Negative for back pain.  Skin: Negative for rash.  Neurological: Negative for weakness, numbness and headaches.  Psychiatric/Behavioral: Negative for behavioral problems.    Allergies  Review of patient's allergies indicates no known allergies.  Home Medications    Current Outpatient Rx  Name  Route  Sig  Dispense  Refill  . ibuprofen (ADVIL,MOTRIN) 600 MG tablet   Oral   Take 600 mg by mouth every 6 (six) hours as needed for pain.         Marland Kitchen oxyCODONE-acetaminophen (PERCOCET/ROXICET) 5-325 MG per tablet   Oral   Take 1-2 tablets by mouth every 6 (six) hours as needed for pain.   15 tablet   0     BP 110/68  Pulse 68  Temp(Src) 97.8 F (36.6 C) (Oral)  Resp 16  SpO2 97%  Physical Exam  Nursing note and vitals reviewed. Constitutional: He is oriented to person, place, and time. He appears well-developed and well-nourished.  HENT:  Head: Normocephalic and atraumatic.  Eyes: EOM are normal. Pupils are equal, round, and reactive to light.  Neck: Normal range of motion. Neck supple.  Cardiovascular: Normal rate, regular rhythm and normal heart sounds.   No murmur heard. Pulmonary/Chest: Effort normal and breath sounds normal.  Abdominal: Soft. Bowel sounds are normal. He exhibits no distension and no mass. There is no tenderness. There is no rebound and no guarding.  Genitourinary:  CVA tenderness on left.  Musculoskeletal: Normal range of motion. He exhibits no edema.  Neurological: He is alert and oriented to person, place, and time. No cranial nerve deficit.  Skin: Skin is warm and dry.  Psychiatric: He has a normal mood and affect.    ED Course  Procedures (including critical care time)  Labs Reviewed  CBC WITH DIFFERENTIAL -  Abnormal; Notable for the following:    WBC 11.3 (*)    All other components within normal limits  URINALYSIS, ROUTINE W REFLEX MICROSCOPIC - Abnormal; Notable for the following:    APPearance TURBID (*)    All other components within normal limits  POCT I-STAT, CHEM 8 - Abnormal; Notable for the following:    Glucose, Bld 108 (*)    All other components within normal limits  URINE MICROSCOPIC-ADD ON   US Renal  07/13/2012  *RADIOLOGY REPORT*  Clinical Data: Flank pain.  No hematuria.   RENAL/URINARY TRACT ULTRASOUND COMPLETE  Comparison:  CT abdomen and pelvis 06/19/2012  Findings:  Right Kidney:  Right kidney measures 11.7 cm length.  Normal parenchymal echotexture.  Left Kidney:  Left kidney measures 13.2 cm length.  Normal parenchymal echotexture.  No hydronephrosis.  Small stone demonstrated in the midportion measuring 4.8 mm diameter.  Bladder:  The bladder wall is not thickened.  Color flow Doppler images demonstrate bilateral urine flow jets.  IMPRESSION: Nonobstructing stone in the midportion of the left kidney.  No evidence of hydronephrosis of either kidney.   Original Report Authenticated By: Burman Nieves, M.D.      1. Flank pain       MDM  Patient with left flank pain. Recently seen for the same and had CT done that showed only renal stones. Laboratory done now shows only stable with mildly elevated white count.    renal ultrasound does not show hydronephrosis. Patient be discharged to follow with urology. Doubt severe intra-abdominal infection since recent negative CT and      Tahara Ruffini R. Rubin Payor, MD 07/13/12 [phone]

## 2012-07-13 NOTE — ED Notes (Addendum)
Patient requested pain medication.  

## 2012-09-15 ENCOUNTER — Encounter (HOSPITAL_COMMUNITY): Payer: Self-pay

## 2012-09-15 ENCOUNTER — Emergency Department (HOSPITAL_COMMUNITY)
Admission: EM | Admit: 2012-09-15 | Discharge: 2012-09-15 | Disposition: A | Discharge status: Home / Self Care | Payer: Self-pay | Attending: Emergency Medicine | Admitting: Emergency Medicine

## 2012-09-15 DIAGNOSIS — R5381 Other malaise: Secondary | ICD-10-CM | POA: Insufficient documentation

## 2012-09-15 DIAGNOSIS — M6281 Muscle weakness (generalized): Secondary | ICD-10-CM | POA: Insufficient documentation

## 2012-09-15 DIAGNOSIS — F17200 Nicotine dependence, unspecified, uncomplicated: Secondary | ICD-10-CM | POA: Insufficient documentation

## 2012-09-15 DIAGNOSIS — M5412 Radiculopathy, cervical region: Secondary | ICD-10-CM | POA: Insufficient documentation

## 2012-09-15 DIAGNOSIS — Z87442 Personal history of urinary calculi: Secondary | ICD-10-CM | POA: Insufficient documentation

## 2012-09-15 DIAGNOSIS — M542 Cervicalgia: Secondary | ICD-10-CM | POA: Insufficient documentation

## 2012-09-15 DIAGNOSIS — R209 Unspecified disturbances of skin sensation: Secondary | ICD-10-CM | POA: Insufficient documentation

## 2012-09-15 MED ORDER — PREDNISONE 20 MG PO TABS: 60.0000 mg | ORAL_TABLET | Freq: Every day | ORAL | Status: DC

## 2012-09-15 MED ORDER — HYDROCODONE-ACETAMINOPHEN 5-325 MG PO TABS: 1.0000 | ORAL_TABLET | ORAL | Status: DC | PRN

## 2012-09-15 NOTE — ED Notes (Signed)
Patient given discharge instructions for pinched nerve. rx for norco and prednisone. Advised to follow up with primary care. Patient voiced understanding of all instructions. Patient ambulated to front lobby.

## 2012-09-15 NOTE — ED Provider Notes (Signed)
History    This chart was scribed for non-physician practitioner Elpidio Anis, PA-C working with Dione Booze, MD by Gerlean Ren, ED Scribe. This patient was seen in room TR07C/TR07C and the patient's care was started at 9:10 PM.   CSN: [id number]  Arrival date & time 09/15/12  2003   First MD Initiated Contact with Patient 09/15/12 2046      Chief Complaint  Patient presents with  . Hand Pain     The history is provided by the patient. No language interpreter was used.  Jacob Nixon is a 22 y.o. male who presents to the Emergency Department complaining of 4 days of numbness over right thumb, index finger, middle finger, and radial surface of ring finger with associated shooting pain in right lateral neck and weakness in right hand causing pt to drop several tools at work today.  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  Pt reports similar pain has occurred before and have been resolved with ibuprofen but reports taking ibuprofen today with no relief to pain.  No PCP. Past Medical History  Diagnosis Date  . Kidney stones     Past Surgical History  Procedure Laterality Date  . Kidney stone surgery      lithortripsy    History reviewed. No pertinent family history.  History  Substance Use Topics  . Smoking status: Current Every Day Smoker  . Smokeless tobacco: Not on file  . Alcohol Use: No      Review of Systems  HENT: Positive for neck pain (right lateral).   Neurological: Positive for weakness and numbness.  All other systems reviewed and are negative.    Allergies  Review of patient's allergies indicates no known allergies.  Home Medications  No current outpatient prescriptions on file.  BP 131/80  Pulse 88  Temp(Src) 98 F (36.7 C) (Oral)  Resp 18  SpO2 100%  Physical Exam  Nursing note and vitals reviewed. Constitutional: He is oriented to person, place, and time.  He appears well-developed and well-nourished. No distress.  HENT:  Head: Normocephalic and atraumatic.  Eyes: EOM are normal.  Neck: Neck supple. No tracheal deviation present.  Cardiovascular: Normal rate.   Intact distal pulses  Pulmonary/Chest: Effort normal. No respiratory distress.  Musculoskeletal: Normal range of motion.  Mild to moderate right paracervical tenderness  Neurological: He is alert and oriented to person, place, and time.  Right hand dominant no strength loss, full ROM of all digits.  Skin: Skin is warm and dry.  Psychiatric: He has a normal mood and affect. His behavior is normal.    ED Course  Procedures (including critical care time) DIAGNOSTIC STUDIES: Oxygen Saturation is 100% on room air, normal by my interpretation.    COORDINATION OF CARE: 9:14 PM- Informed pt that numbness may be due to impinged cervical nerve and that follow-up for outpt imaging is needed.  Discussed pain medicine and steroids.  Pt verbalizes understanding and agrees.      No diagnosis found. 1. Cervical radiculopathy   MDM  No neurologic deficits on exam. Will treat conservatively and refer to neurosurgery if pain persists.     I personally performed the services described in this documentation, which was scribed in my presence. The recorded information has been reviewed and is accurate.       Arnoldo Hooker, PA-C 09/15/12 2340

## 2012-09-15 NOTE — ED Notes (Signed)
Pt c/o numbness to his Right middle, pointer, and thumb finger and the palm of hand from that area over x4 days, pt reports he felt a pop in his hand 4 days ago when he lifted a tire and rim at work. Pt also c/o Right lateral neck pain

## 2012-09-15 NOTE — ED Provider Notes (Signed)
Medical screening examination/treatment/procedure(s) were performed by non-physician practitioner and as supervising physician I was immediately available for consultation/collaboration.   Bhavya Eschete, MD 09/15/12 2354 

## 2012-09-29 ENCOUNTER — Other Ambulatory Visit: Payer: Self-pay | Admitting: Orthopedic Surgery

## 2012-09-29 DIAGNOSIS — R531 Weakness: Secondary | ICD-10-CM

## 2012-09-29 DIAGNOSIS — R609 Edema, unspecified: Secondary | ICD-10-CM

## 2012-09-29 DIAGNOSIS — R200 Anesthesia of skin: Secondary | ICD-10-CM

## 2012-09-29 DIAGNOSIS — M25531 Pain in right wrist: Secondary | ICD-10-CM

## 2012-09-30 ENCOUNTER — Ambulatory Visit
Admission: RE | Admit: 2012-09-30 | Discharge: 2012-09-30 | Disposition: A | Discharge status: Home / Self Care | Payer: Self-pay | Source: Ambulatory Visit | Attending: Orthopedic Surgery | Admitting: Orthopedic Surgery

## 2012-09-30 DIAGNOSIS — R609 Edema, unspecified: Secondary | ICD-10-CM

## 2012-09-30 DIAGNOSIS — R200 Anesthesia of skin: Secondary | ICD-10-CM

## 2012-09-30 DIAGNOSIS — M25531 Pain in right wrist: Secondary | ICD-10-CM

## 2012-09-30 DIAGNOSIS — R531 Weakness: Secondary | ICD-10-CM

## 2012-09-30 MED ORDER — GADOBENATE DIMEGLUMINE 529 MG/ML IV SOLN
20.0000 mL | Freq: Once | INTRAVENOUS | Status: AC | PRN
  Administered 2012-09-30: 20 mL via INTRAVENOUS

## 2013-01-09 ENCOUNTER — Emergency Department (HOSPITAL_COMMUNITY): Payer: Self-pay

## 2013-01-09 ENCOUNTER — Emergency Department (HOSPITAL_COMMUNITY)
Admission: EM | Admit: 2013-01-09 | Discharge: 2013-01-09 | Disposition: A | Discharge status: Home / Self Care | Payer: Self-pay | Attending: Emergency Medicine | Admitting: Emergency Medicine

## 2013-01-09 ENCOUNTER — Encounter (HOSPITAL_COMMUNITY): Payer: Self-pay | Admitting: Emergency Medicine

## 2013-01-09 DIAGNOSIS — R110 Nausea: Secondary | ICD-10-CM | POA: Insufficient documentation

## 2013-01-09 DIAGNOSIS — R109 Unspecified abdominal pain: Secondary | ICD-10-CM | POA: Insufficient documentation

## 2013-01-09 DIAGNOSIS — Z87442 Personal history of urinary calculi: Secondary | ICD-10-CM | POA: Insufficient documentation

## 2013-01-09 DIAGNOSIS — F17200 Nicotine dependence, unspecified, uncomplicated: Secondary | ICD-10-CM | POA: Insufficient documentation

## 2013-01-09 DIAGNOSIS — N390 Urinary tract infection, site not specified: Secondary | ICD-10-CM | POA: Insufficient documentation

## 2013-01-09 LAB — POCT I-STAT, CHEM 8
BUN: 6 mg/dL (ref 6–23)
Creatinine, Ser: 0.9 mg/dL (ref 0.50–1.35)
Glucose, Bld: 131 mg/dL — ABNORMAL HIGH (ref 70–99)
Hemoglobin: 15.3 g/dL (ref 13.0–17.0)
Potassium: 3.3 mEq/L — ABNORMAL LOW (ref 3.5–5.1)

## 2013-01-09 LAB — URINALYSIS, ROUTINE W REFLEX MICROSCOPIC
Protein, ur: 100 mg/dL — AB
Specific Gravity, Urine: 1.027 (ref 1.005–1.030)
Urobilinogen, UA: 1 mg/dL (ref 0.0–1.0)

## 2013-01-09 LAB — URINE MICROSCOPIC-ADD ON

## 2013-01-09 MED ORDER — OXYCODONE-ACETAMINOPHEN 5-325 MG PO TABS: 2.0000 | ORAL_TABLET | ORAL | Status: DC | PRN

## 2013-01-09 MED ORDER — CIPROFLOXACIN HCL 500 MG PO TABS: 500.0000 mg | ORAL_TABLET | Freq: Two times a day (BID) | ORAL | Status: DC

## 2013-01-09 MED ORDER — SODIUM CHLORIDE 0.9 % IV BOLUS (SEPSIS)
1000.0000 mL | Freq: Once | INTRAVENOUS | Status: AC
  Administered 2013-01-09: 1000 mL via INTRAVENOUS

## 2013-01-09 MED ORDER — HYDROMORPHONE HCL PF 1 MG/ML IJ SOLN
1.0000 mg | Freq: Once | INTRAMUSCULAR | Status: AC
  Administered 2013-01-09: 1 mg via INTRAVENOUS
  Filled 2013-01-09: qty 1

## 2013-01-09 MED ORDER — ONDANSETRON HCL 4 MG/2ML IJ SOLN
4.0000 mg | Freq: Once | INTRAMUSCULAR | Status: AC
  Administered 2013-01-09: 4 mg via INTRAVENOUS
  Filled 2013-01-09: qty 2

## 2013-01-09 MED ORDER — KETOROLAC TROMETHAMINE 30 MG/ML IJ SOLN
30.0000 mg | Freq: Once | INTRAMUSCULAR | Status: AC
  Administered 2013-01-09: 30 mg via INTRAVENOUS
  Filled 2013-01-09: qty 1

## 2013-01-09 MED ORDER — MORPHINE SULFATE 4 MG/ML IJ SOLN
4.0000 mg | Freq: Once | INTRAMUSCULAR | Status: AC
  Administered 2013-01-09: 4 mg via INTRAVENOUS
  Filled 2013-01-09: qty 1

## 2013-01-09 NOTE — ED Notes (Signed)
Pt was waiting for ride in room. Pt wheeled to WR- ride ready.

## 2013-01-09 NOTE — ED Provider Notes (Signed)
CSN: [id number]     Arrival date & time 01/09/13  1542 History     First MD Initiated Contact with Patient 01/09/13 1549     Chief Complaint  Patient presents with  . Flank Pain   (Consider location/radiation/quality/duration/timing/severity/associated sxs/prior Treatment) HPI Comments: Pt states that he has had left flank pain times 3 days with pain worsening today:pt state that symptoms feel similar to previous kidney stones  Patient is a 22 y.o. male presenting with flank pain. The history is provided by the patient. No language interpreter was used.  Flank Pain This is a recurrent problem. The current episode started in the past 7 days. The problem occurs constantly. The problem has been gradually worsening. Associated symptoms include abdominal pain and nausea. Pertinent negatives include no vomiting. Nothing aggravates the symptoms. He has tried nothing for the symptoms.    Past Medical History  Diagnosis Date  . Kidney stones    Past Surgical History  Procedure Laterality Date  . Kidney stone surgery      lithortripsy  . Hand surgery     No family history on file. History  Substance Use Topics  . Smoking status: Current Every Day Smoker  . Smokeless tobacco: Not on file  . Alcohol Use: Yes    Review of Systems  Constitutional: Negative.   Respiratory: Negative.   Cardiovascular: Negative.   Gastrointestinal: Positive for nausea and abdominal pain. Negative for vomiting.  Genitourinary: Positive for flank pain.    Allergies  Review of patient's allergies indicates no known allergies.  Home Medications  No current outpatient prescriptions on file. BP 144/73  Pulse 80  Temp(Src) 97.8 F (36.6 C) (Oral)  Resp 22  SpO2 96% Physical Exam  Nursing note and vitals reviewed. Constitutional: He is oriented to person, place, and time. He appears well-developed and well-nourished.  Neck: Neck supple.  Cardiovascular: Normal rate and regular rhythm.    Pulmonary/Chest: Effort normal and breath sounds normal.  Abdominal: Soft. Bowel sounds are normal. There is no tenderness. There is no rebound and no CVA tenderness.  Musculoskeletal: Normal range of motion.  Neurological: He is alert and oriented to person, place, and time.  Skin: Skin is warm and dry.  Psychiatric: He has a normal mood and affect.    ED Course   Procedures (including critical care time)  Labs Reviewed  URINALYSIS, ROUTINE W REFLEX MICROSCOPIC - Abnormal; Notable for the following:    Color, Urine RED (*)    APPearance TURBID (*)    Hgb urine dipstick LARGE (*)    Bilirubin Urine MODERATE (*)    Ketones, ur 15 (*)    Protein, ur 100 (*)    Leukocytes, UA SMALL (*)    All other components within normal limits  URINE MICROSCOPIC-ADD ON - Abnormal; Notable for the following:    Squamous Epithelial / LPF FEW (*)    Bacteria, UA FEW (*)    Crystals CA OXALATE CRYSTALS (*)    All other components within normal limits  POCT I-STAT, CHEM 8 - Abnormal; Notable for the following:    Potassium 3.3 (*)    Glucose, Bld 131 (*)    All other components within normal limits  URINE CULTURE   Dg Abd 1 View  01/09/2013   *RADIOLOGY REPORT*  Clinical Data: Left lower quadrant abdominal pain.  Nausea and vomiting.  ABDOMEN - 1 VIEW  Comparison: CT abdomen and pelvis 06/19/2012.  Findings: Bowel gas pattern unremarkable without evidence of obstruction  or significant ileus.  Expected stool burden.  No visible opaque urinary tract calculi.  Regional skeleton unremarkable.  IMPRESSION: No acute abdominal abnormality.   Original Report Authenticated By: Hulan Saas, M.D.   1. UTI (lower urinary tract infection)   2. Flank pain     MDM  Pt has history of stone in the kidney and not the ureters:pt had ct and ultrasound recently:don't think further imaging is needed at this time:doubt acute abdominal process as exam is benign:will treat urine:urine sent for culture;pt is afebrile  and vitals are stable  Teressa Lower, NP 01/09/13 2022

## 2013-01-09 NOTE — ED Notes (Signed)
Onset one day ago left flank pain no radiating pain. Denies dysuria however is dark in color.

## 2013-01-10 NOTE — ED Provider Notes (Signed)
Medical screening examination/treatment/procedure(s) were performed by non-physician practitioner and as supervising physician I was immediately available for consultation/collaboration.  Carl Butner T Gadea, MD 01/10/13 1755 

## 2013-01-11 LAB — URINE CULTURE: Colony Count: NO GROWTH

## 2013-03-09 ENCOUNTER — Emergency Department (HOSPITAL_COMMUNITY)
Admission: EM | Admit: 2013-03-09 | Discharge: 2013-03-10 | Disposition: A | Discharge status: Home / Self Care | Payer: Self-pay | Attending: Emergency Medicine | Admitting: Emergency Medicine

## 2013-03-09 ENCOUNTER — Encounter (HOSPITAL_COMMUNITY): Payer: Self-pay | Admitting: Emergency Medicine

## 2013-03-09 ENCOUNTER — Emergency Department (HOSPITAL_COMMUNITY): Payer: Self-pay

## 2013-03-09 DIAGNOSIS — J069 Acute upper respiratory infection, unspecified: Secondary | ICD-10-CM | POA: Insufficient documentation

## 2013-03-09 DIAGNOSIS — F17200 Nicotine dependence, unspecified, uncomplicated: Secondary | ICD-10-CM | POA: Insufficient documentation

## 2013-03-09 DIAGNOSIS — R0602 Shortness of breath: Secondary | ICD-10-CM | POA: Insufficient documentation

## 2013-03-09 DIAGNOSIS — J40 Bronchitis, not specified as acute or chronic: Secondary | ICD-10-CM

## 2013-03-09 DIAGNOSIS — Z87442 Personal history of urinary calculi: Secondary | ICD-10-CM | POA: Insufficient documentation

## 2013-03-09 DIAGNOSIS — Z79899 Other long term (current) drug therapy: Secondary | ICD-10-CM | POA: Insufficient documentation

## 2013-03-09 DIAGNOSIS — R0789 Other chest pain: Secondary | ICD-10-CM | POA: Insufficient documentation

## 2013-03-09 DIAGNOSIS — J209 Acute bronchitis, unspecified: Secondary | ICD-10-CM | POA: Insufficient documentation

## 2013-03-09 DIAGNOSIS — Z8709 Personal history of other diseases of the respiratory system: Secondary | ICD-10-CM | POA: Insufficient documentation

## 2013-03-09 MED ORDER — FLUTICASONE PROPIONATE 50 MCG/ACT NA SUSP: 2.0000 | Freq: Every day | NASAL | Status: DC

## 2013-03-09 MED ORDER — ALBUTEROL SULFATE HFA 108 (90 BASE) MCG/ACT IN AERS
2.0000 | INHALATION_SPRAY | RESPIRATORY_TRACT | Status: DC | PRN
  Administered 2013-03-10: 2 via RESPIRATORY_TRACT
  Filled 2013-03-09: qty 6.7

## 2013-03-09 MED ORDER — AEROCHAMBER PLUS W/MASK MISC
1.0000 | Freq: Once | Status: AC
  Administered 2013-03-10: 1

## 2013-03-09 MED ORDER — GUAIFENESIN ER 600 MG PO TB12: 1200.0000 mg | ORAL_TABLET | Freq: Two times a day (BID) | ORAL | Status: DC

## 2013-03-09 MED ORDER — OXYMETAZOLINE HCL 0.05 % NA SOLN
1.0000 | Freq: Once | NASAL | Status: DC
  Filled 2013-03-09: qty 15

## 2013-03-09 MED ORDER — BENZONATATE 100 MG PO CAPS: 100.0000 mg | ORAL_CAPSULE | Freq: Three times a day (TID) | ORAL | Status: DC | PRN

## 2013-03-09 NOTE — ED Notes (Addendum)
Presents with chest congestion, dry hacking cough and feeling SOB, ongoing since Friday. Reports, "this feels just like when I had bronchitis" denies chest pain. , bilateral breath sounds clear. Painful cough.  +everyday smoker.  Sats 100% RA, able to speak in full sentences

## 2013-03-09 NOTE — ED Provider Notes (Signed)
CSN: [id number]     Arrival date & time 03/09/13  2239 History   This chart was scribed for non-physician practitioner Dierdre Forth, PA-C working with Derwood Kaplan, MD by Clydene Laming, ED Scribe. This patient was seen in room TR11C/TR11C and the patient's care was started at 12:03 AM.   Chief Complaint  Patient presents with  . Cough    The history is provided by the patient and medical records. No language interpreter was used.   HPI Comments: Jacob Nixon is a 22 y.o. male who presents to the Emergency Department complaining of a cough onset eight days ago with associated chest tightness, nasal congestion and rhinorrhea. Pt reports SOB onset yesterday. Pt denies fever, chills, nausea, vomiting, ear pain, sore throat, itching and watering eyes. He reports a hx of kidney stones, but no other medical Hx. Pt reports yellow sputum occasionally in the past few days. Pt has a hx of bronchitis 1-2 times per year for the past 3-4 years. Pt reports smoking hookah once a a day.     Past Medical History  Diagnosis Date  . Kidney stones    Past Surgical History  Procedure Laterality Date  . Kidney stone surgery      lithortripsy  . Hand surgery     History reviewed. No pertinent family history. History  Substance Use Topics  . Smoking status: Current Every Day Smoker  . Smokeless tobacco: Not on file  . Alcohol Use: Yes    Review of Systems  Constitutional: Negative for fever, chills, appetite change and fatigue.  HENT: Positive for congestion, postnasal drip, rhinorrhea, sinus pressure and sore throat. Negative for ear discharge, ear pain and mouth sores.   Eyes: Negative for visual disturbance.  Respiratory: Positive for cough, chest tightness and shortness of breath. Negative for wheezing and stridor.   Cardiovascular: Negative for chest pain, palpitations and leg swelling.  Gastrointestinal: Negative for nausea, vomiting, abdominal pain and diarrhea.  Genitourinary:  Negative for dysuria, urgency, frequency and hematuria.  Musculoskeletal: Negative for arthralgias, back pain, myalgias and neck stiffness.  Skin: Negative for rash.  Neurological: Negative for syncope, light-headedness, numbness and headaches.  Hematological: Negative for adenopathy.  Psychiatric/Behavioral: The patient is not nervous/anxious.   All other systems reviewed and are negative.    Allergies  Review of patient's allergies indicates no known allergies.  Home Medications   Current Outpatient Rx  Name  Route  Sig  Dispense  Refill  . Pseudoeph-Doxylamine-DM-APAP (NYQUIL PO)   Oral   Take 10 mLs by mouth at bedtime as needed (flu-like symptoms).         . benzonatate (TESSALON PERLES) 100 MG capsule   Oral   Take 1 capsule (100 mg total) by mouth 3 (three) times daily as needed for cough (cough).   20 capsule   0   . fluticasone (FLONASE) 50 MCG/ACT nasal spray   Nasal   Place 2 sprays into the nose daily.   16 g   2   . guaiFENesin (MUCINEX) 600 MG 12 hr tablet   Oral   Take 2 tablets (1,200 mg total) by mouth 2 (two) times daily.   20 tablet   0    Triage Vitals:BP 129/78  Pulse 116  Temp(Src) 98.6 F (37 C) (Oral)  Resp 18  SpO2 100% Physical Exam  Nursing note and vitals reviewed. Constitutional: He is oriented to person, place, and time. He appears well-developed and well-nourished. No distress.  Awake, alert, nontoxic appearance  HENT:  Head: Normocephalic and atraumatic.  Right Ear: Tympanic membrane, external ear and ear canal normal.  Left Ear: Tympanic membrane, external ear and ear canal normal.  Nose: Mucosal edema and rhinorrhea present. No epistaxis. Right sinus exhibits no maxillary sinus tenderness and no frontal sinus tenderness. Left sinus exhibits no maxillary sinus tenderness and no frontal sinus tenderness.  Mouth/Throat: Uvula is midline, oropharynx is clear and moist and mucous membranes are normal. Mucous membranes are not pale  and not cyanotic. No oropharyngeal exudate, posterior oropharyngeal edema, posterior oropharyngeal erythema or tonsillar abscesses.  Eyes: Conjunctivae are normal. Pupils are equal, round, and reactive to light. No scleral icterus.  Neck: Normal range of motion and full passive range of motion without pain. Neck supple.  Cardiovascular: Regular rhythm, normal heart sounds and intact distal pulses.   No murmur heard. Tachycardia  Pulmonary/Chest: Effort normal and breath sounds normal. No stridor. No respiratory distress. He has no wheezes.  Clear and equal breath sounds; no rales or rhonchi; no wheezes  Abdominal: Soft. Bowel sounds are normal. He exhibits no mass. There is no tenderness. There is no rebound and no guarding.  Musculoskeletal: Normal range of motion. He exhibits no edema.  Lymphadenopathy:    He has no cervical adenopathy.  Neurological: He is alert and oriented to person, place, and time. He exhibits normal muscle tone. Coordination normal.  Speech is clear and goal oriented Moves extremities without ataxia  Skin: Skin is warm and dry. No rash noted. He is not diaphoretic.  Psychiatric: He has a normal mood and affect. His behavior is normal.    ED Course  Procedures (including critical care time) DIAGNOSTIC STUDIES: Oxygen Saturation is 100% on RA, normal by my interpretation.    COORDINATION OF CARE: 12:03 AM- Discussed treatment plan with pt at bedside. Pt verbalized understanding and agreement with plan.   Labs Review Labs Reviewed - No data to display Imaging Review Dg Chest 2 View  03/09/2013   CLINICAL DATA:  Shortness of breath  EXAM: CHEST  2 VIEW  COMPARISON:  03/29/2012  FINDINGS: Borderline hyperinflation with suggestion of perihilar interstitial changes and mild peribronchial thickening. Changes may represent asthma or chronic bronchitis. Similar appearance to previous study. No focal airspace consolidation. No blunting of costophrenic angles. No  pneumothorax. Normal heart size and pulmonary vascularity.  IMPRESSION: Bronchitic changes suggesting asthma or chronic bronchitis. No focal consolidation.   Electronically Signed   By: Burman Nieves M.D.   On: 03/09/2013 23:28    EKG Interpretation   None       MDM   1. Viral URI with cough   2. Bronchitis     [place] [place]. presents with URI symptoms.  Pt CXR negative for acute infiltrate. I personally reviewed the imaging tests through PACS system.  I reviewed available ER/hospitalization records through the EMR.  Patients symptoms are consistent with URI, likely viral etiology. Discussed that antibiotics are not indicated for viral infections. Pt will be discharged with symptomatic treatment.  Verbalizes understanding and is agreeable with plan. Pt is hemodynamically stable & in NAD prior to dc.  It has been determined that no acute conditions requiring further emergency intervention are present at this time. The patient/guardian have been advised of the diagnosis and plan. We have discussed signs and symptoms that warrant return to the ED, such as changes or worsening in symptoms.   Vital signs are stable at discharge with noted tachycardia (102 on physical exam).   BP  129/78  Pulse 116  Temp(Src) 98.6 F (37 C) (Oral)  Resp 18  SpO2 100%  Patient/guardian has voiced understanding and agreed to follow-up with the PCP or specialist.  I personally performed the services described in this documentation, which was scribed in my presence. The recorded information has been reviewed and is accurate.     Dahlia Client Charmin Aguiniga, PA-C 03/10/13 0005

## 2013-03-10 NOTE — ED Provider Notes (Signed)
Medical screening examination/treatment/procedure(s) were performed by non-physician practitioner and as supervising physician I was immediately available for consultation/collaboration.   Derwood Kaplan, MD 03/10/13 1511

## 2013-03-15 ENCOUNTER — Emergency Department (HOSPITAL_COMMUNITY)
Admission: EM | Admit: 2013-03-15 | Discharge: 2013-03-15 | Disposition: A | Discharge status: Home / Self Care | Payer: Self-pay | Attending: Emergency Medicine | Admitting: Emergency Medicine

## 2013-03-15 ENCOUNTER — Encounter (HOSPITAL_COMMUNITY): Payer: Self-pay | Admitting: Emergency Medicine

## 2013-03-15 ENCOUNTER — Emergency Department (HOSPITAL_COMMUNITY): Payer: Self-pay

## 2013-03-15 DIAGNOSIS — J40 Bronchitis, not specified as acute or chronic: Secondary | ICD-10-CM | POA: Insufficient documentation

## 2013-03-15 DIAGNOSIS — Z79899 Other long term (current) drug therapy: Secondary | ICD-10-CM | POA: Insufficient documentation

## 2013-03-15 DIAGNOSIS — Z87442 Personal history of urinary calculi: Secondary | ICD-10-CM | POA: Insufficient documentation

## 2013-03-15 DIAGNOSIS — F17200 Nicotine dependence, unspecified, uncomplicated: Secondary | ICD-10-CM | POA: Insufficient documentation

## 2013-03-15 DIAGNOSIS — J9801 Acute bronchospasm: Secondary | ICD-10-CM

## 2013-03-15 DIAGNOSIS — J3489 Other specified disorders of nose and nasal sinuses: Secondary | ICD-10-CM | POA: Insufficient documentation

## 2013-03-15 DIAGNOSIS — IMO0002 Reserved for concepts with insufficient information to code with codable children: Secondary | ICD-10-CM | POA: Insufficient documentation

## 2013-03-15 HISTORY — DX: Bronchitis, not specified as acute or chronic: J40

## 2013-03-15 MED ORDER — IPRATROPIUM BROMIDE 0.02 % IN SOLN
0.5000 mg | Freq: Once | RESPIRATORY_TRACT | Status: AC
  Administered 2013-03-15: 0.5 mg via RESPIRATORY_TRACT
  Filled 2013-03-15: qty 2.5

## 2013-03-15 MED ORDER — PREDNISONE 20 MG PO TABS: ORAL_TABLET | ORAL | Status: DC

## 2013-03-15 MED ORDER — ALBUTEROL SULFATE (5 MG/ML) 0.5% IN NEBU
5.0000 mg | INHALATION_SOLUTION | Freq: Once | RESPIRATORY_TRACT | Status: AC
  Administered 2013-03-15: 5 mg via RESPIRATORY_TRACT
  Filled 2013-03-15: qty 1

## 2013-03-15 MED ORDER — ALBUTEROL SULFATE HFA 108 (90 BASE) MCG/ACT IN AERS
2.0000 | INHALATION_SPRAY | Freq: Once | RESPIRATORY_TRACT | Status: DC
  Filled 2013-03-15: qty 6.7

## 2013-03-15 MED ORDER — PREDNISONE 20 MG PO TABS
60.0000 mg | ORAL_TABLET | Freq: Once | ORAL | Status: AC
  Administered 2013-03-15: 60 mg via ORAL
  Filled 2013-03-15: qty 3

## 2013-03-15 NOTE — ED Notes (Signed)
Pt states that this has been going on for 2 weeks pt was seen last week at [facility] and had bronchitis. Taking Mucinex, inhaler but not helping or getting better. Pt denies PMH of asthma

## 2013-03-15 NOTE — ED Provider Notes (Signed)
CSN: [id number]     Arrival date & time 03/15/13  1742 History   First MD Initiated Contact with Patient 03/15/13 1747     No chief complaint on file.  (Consider location/radiation/quality/duration/timing/severity/associated sxs/prior Treatment) HPI Comments: Patient is a 22 year old male with a past medical history of bronchitis who presents to the emergency department complaining of continuing cough and chest tightness since being seen in the emergency department 5 days ago on October 22. Symptoms originally began about 2 weeks ago. He was diagnosed with bronchitis and viral URI, given prescriptions for Tessalon Perles, Flonase and an inhaler, advised to take Mucinex although she has been using without relief. Cough is dry. States he was congested initially, this has since subsided. Denies fever, chills, chest pain, shortness of breath, nausea or vomiting.  The history is provided by the patient.    Past Medical History  Diagnosis Date  . Kidney stones    Past Surgical History  Procedure Laterality Date  . Kidney stone surgery      lithortripsy  . Hand surgery     No family history on file. History  Substance Use Topics  . Smoking status: Current Every Day Smoker  . Smokeless tobacco: Not on file  . Alcohol Use: Yes    Review of Systems  HENT: Positive for congestion.   Respiratory: Positive for cough, chest tightness and wheezing.   All other systems reviewed and are negative.    Allergies  Review of patient's allergies indicates no known allergies.  Home Medications   Current Outpatient Rx  Name  Route  Sig  Dispense  Refill  . benzonatate (TESSALON PERLES) 100 MG capsule   Oral   Take 1 capsule (100 mg total) by mouth 3 (three) times daily as needed for cough (cough).   20 capsule   0   . fluticasone (FLONASE) 50 MCG/ACT nasal spray   Nasal   Place 2 sprays into the nose daily.   16 g   2   . guaiFENesin (MUCINEX) 600 MG 12 hr tablet   Oral   Take 2  tablets (1,200 mg total) by mouth 2 (two) times daily.   20 tablet   0   . Pseudoeph-Doxylamine-DM-APAP (NYQUIL PO)   Oral   Take 10 mLs by mouth at bedtime as needed (flu-like symptoms).          There were no vitals taken for this visit. Physical Exam  Nursing note and vitals reviewed. Constitutional: He is oriented to person, place, and time. He appears well-developed and well-nourished. No distress.  HENT:  Head: Normocephalic and atraumatic.  Nose: Mucosal edema present.  Pharynx erythematous without edema or exudate.  Eyes: Conjunctivae are normal.  Neck: Normal range of motion. Neck supple. No tracheal deviation present.  Cardiovascular: Normal rate, regular rhythm and normal heart sounds.   Pulmonary/Chest: Effort normal and breath sounds normal. No respiratory distress.  Harsh sounding cough. Mild expiratory wheezes bilateral.  Abdominal: Soft. Bowel sounds are normal. There is no tenderness.  Musculoskeletal: Normal range of motion. He exhibits no edema.  Neurological: He is alert and oriented to person, place, and time.  Skin: Skin is warm and dry. He is not diaphoretic.  Psychiatric: He has a normal mood and affect. His behavior is normal.    ED Course  Procedures (including critical care time) Labs Review Labs Reviewed - No data to display Imaging Review Dg Chest 2 View  03/15/2013   CLINICAL DATA:  Shortness of breath, cough.  EXAM: CHEST  2 VIEW  COMPARISON:  03/09/2013  FINDINGS: The heart size and mediastinal contours are within normal limits. Both lungs are clear. The visualized skeletal structures are unremarkable.  IMPRESSION: No active cardiopulmonary disease.   Electronically Signed   By: Oley Balm M.D.   On: 03/15/2013 18:10    EKG Interpretation   None       MDM   1. Bronchitis   2. Bronchospasm     Patient with continuing cough since being seen 5 days ago. He is well appearing and in no apparent distress with normal vital signs.  Afebrile. O2 sat 98% on room air. Given symptoms still present, will repeat CXR, given prednisone 60 mg PO, DuoNeb. 6:34 PM CXR clear. Patient reports great improvement of his symptoms with above treatment. On re-examination, lungs clear. Stable for discharge. Short course of steroids, continue with tx given prior. Return precautions given. Patient states understanding of treatment care plan and is agreeable.   Trevor Mace, PA-C 03/15/13 1836

## 2013-03-15 NOTE — ED Notes (Signed)
Patient transported to X-ray 

## 2013-03-16 NOTE — ED Provider Notes (Signed)
Medical screening examination/treatment/procedure(s) were conducted as a shared visit with non-physician practitioner(s) and myself.  I personally evaluated the patient during the encounter.  EKG Interpretation   None       Pt c/o non prod cough, occ wheezing. Smoker. Mild wheezing. No inc wob. Cxr.    Suzi Roots, MD 03/16/13 [phone]

## 2013-05-18 ENCOUNTER — Encounter (HOSPITAL_COMMUNITY): Payer: Self-pay | Admitting: Emergency Medicine

## 2013-05-18 ENCOUNTER — Emergency Department (HOSPITAL_COMMUNITY)
Admission: EM | Admit: 2013-05-18 | Discharge: 2013-05-18 | Disposition: A | Discharge status: Home / Self Care | Payer: BC Managed Care – PPO | Attending: Emergency Medicine | Admitting: Emergency Medicine

## 2013-05-18 DIAGNOSIS — Z87442 Personal history of urinary calculi: Secondary | ICD-10-CM | POA: Insufficient documentation

## 2013-05-18 DIAGNOSIS — R109 Unspecified abdominal pain: Secondary | ICD-10-CM

## 2013-05-18 DIAGNOSIS — Z8709 Personal history of other diseases of the respiratory system: Secondary | ICD-10-CM | POA: Insufficient documentation

## 2013-05-18 DIAGNOSIS — F17200 Nicotine dependence, unspecified, uncomplicated: Secondary | ICD-10-CM | POA: Insufficient documentation

## 2013-05-18 DIAGNOSIS — R110 Nausea: Secondary | ICD-10-CM | POA: Insufficient documentation

## 2013-05-18 DIAGNOSIS — R1013 Epigastric pain: Secondary | ICD-10-CM | POA: Insufficient documentation

## 2013-05-18 DIAGNOSIS — Z79899 Other long term (current) drug therapy: Secondary | ICD-10-CM | POA: Insufficient documentation

## 2013-05-18 DIAGNOSIS — R1012 Left upper quadrant pain: Secondary | ICD-10-CM | POA: Insufficient documentation

## 2013-05-18 LAB — CBC WITH DIFFERENTIAL/PLATELET
Basophils Absolute: 0 10*3/uL (ref 0.0–0.1)
Basophils Relative: 0 % (ref 0–1)
Eosinophils Absolute: 0.1 10*3/uL (ref 0.0–0.7)
Eosinophils Relative: 1 % (ref 0–5)
HCT: 42.6 % (ref 39.0–52.0)
Hemoglobin: 15.3 g/dL (ref 13.0–17.0)
Lymphocytes Relative: 31 % (ref 12–46)
Lymphs Abs: 2.8 10*3/uL (ref 0.7–4.0)
MCH: 30.7 pg (ref 26.0–34.0)
MCHC: 35.9 g/dL (ref 30.0–36.0)
MCV: 85.4 fL (ref 78.0–100.0)
Monocytes Absolute: 0.6 10*3/uL (ref 0.1–1.0)
Monocytes Relative: 7 % (ref 3–12)
Neutro Abs: 5.5 10*3/uL (ref 1.7–7.7)
Neutrophils Relative %: 61 % (ref 43–77)
Platelets: 321 10*3/uL (ref 150–400)
RBC: 4.99 MIL/uL (ref 4.22–5.81)
RDW: 13.5 % (ref 11.5–15.5)
WBC: 9 10*3/uL (ref 4.0–10.5)

## 2013-05-18 LAB — COMPREHENSIVE METABOLIC PANEL
ALT: 35 U/L (ref 0–53)
AST: 21 U/L (ref 0–37)
Albumin: 3.4 g/dL — ABNORMAL LOW (ref 3.5–5.2)
Alkaline Phosphatase: 107 U/L (ref 39–117)
BUN: 9 mg/dL (ref 6–23)
CO2: 25 mEq/L (ref 19–32)
Calcium: 8.9 mg/dL (ref 8.4–10.5)
Chloride: 101 mEq/L (ref 96–112)
Creatinine, Ser: 0.88 mg/dL (ref 0.50–1.35)
GFR calc Af Amer: >90 mL/min (ref >90)
GFR calc non Af Amer: >90 mL/min (ref >90)
Glucose, Bld: 141 mg/dL — ABNORMAL HIGH (ref 70–99)
Potassium: 3.8 mEq/L (ref 3.7–5.3)
Sodium: 140 mEq/L (ref 137–147)
Total Bilirubin: 0.3 mg/dL (ref 0.3–1.2)
Total Protein: 8.1 g/dL (ref 6.0–8.3)

## 2013-05-18 LAB — LIPASE, BLOOD: Lipase: 16 U/L (ref 11–59)

## 2013-05-18 MED ORDER — SUCRALFATE 1 G PO TABS: 1.0000 g | ORAL_TABLET | Freq: Three times a day (TID) | ORAL | Status: DC

## 2013-05-18 MED ORDER — FAMOTIDINE 20 MG PO TABS: 20.0000 mg | ORAL_TABLET | Freq: Two times a day (BID) | ORAL | Status: DC

## 2013-05-18 NOTE — Discharge Instructions (Signed)
°Emergency Department Resource Guide °1) Find a Doctor and Pay Out of Pocket °Although you won't have to find out who is covered by your insurance plan, it is a good idea to ask around and get recommendations. You will then need to call the office and see if the doctor you have chosen will accept you as a new patient and what types of options they offer for patients who are self-pay. Some doctors offer discounts or will set up payment plans for their patients who do not have insurance, but you will need to ask so you aren't surprised when you get to your appointment. ° °2) Contact Your Local Health Department °Not all health departments have doctors that can see patients for sick visits, but many do, so it is worth a call to see if yours does. If you don't know where your local health department is, you can check in your phone book. The CDC also has a tool to help you locate your state's health department, and many state websites also have listings of all of their local health departments. ° °3) Find a Walk-in Clinic °If your illness is not likely to be very severe or complicated, you may want to try a walk in clinic. These are popping up all over the country in pharmacies, drugstores, and shopping centers. They're usually staffed by nurse practitioners or physician assistants that have been trained to treat common illnesses and complaints. They're usually fairly quick and inexpensive. However, if you have serious medical issues or chronic medical problems, these are probably not your best option. ° °No Primary Care Doctor: °- Call [facility] at  832-8000 - they can help you locate a primary care doctor that  accepts your insurance, provides certain services, etc. °- Physician Referral Service- 1-800-533-3463 ° °Chronic Pain Problems: °Organization         Address  Phone   Notes  °[facility]  (336) 297-2271 Patients need to be referred by their primary care doctor.  ° °Medication  Assistance: °Organization         Address  Phone   Notes  °[place] [facility] [address] °[place], [place] [address] (336) 641-8030 --Must be a resident of [place] °-- Must have NO insurance coverage whatsoever (no Medicaid/ Medicare, etc.) °-- The pt. MUST have a primary care doctor that directs their care regularly and follows them in the community °  °[facility]  (866) 331-1348   °[employer]  (888) 892-1162   ° °Agencies that provide inexpensive medical care: °Organization         Address  Phone   Notes  °[facility]  (336) 832-8035   °[facility]    (336) 832-7272   °[facility] [address] °[place], [place] [address] (336) 832-4777   °[facility] [address], °[place] (336) 271-4999   °[facility]    (336) 373-0678   °[facility]    (336) 272-1050   °[facility] ° [address], [place] Phone:  (336) 832-4444, Fax:  (336) 832-4440 Hours of Operation:  9 am - 6 pm, M-F.  Also accepts Medicaid/Medicare and self-pay.  °[facility] ° [address], [place] Phone: (336) 832-3150, Fax: (336) 832-3151. Hours of Operation:  8:30 am - 5:30 pm, M-F.  Also accepts Medicaid and self-pay.  °[facility] [place] [address], [place] Phone: (336) 878-6027   °[facility] [address], [place], [place] (336)723-1848, Ext. 123 Mondays & Thursdays: 7-9 AM.  First 15 patients are seen on a first come, first serve basis. °  ° °Medicaid-accepting [place] Providers: ° °Organization         Address  Phone   Notes  °[facility] [address], [place] (336) 641-2100 Also accepts self-pay patients.  °[facility] [address], [place] ° (336) 856-9996   °[facility] [address], [place]  (336) 288-8857   °[facility] [address] [place] [address], [place] (336) 299-7000   °Veita Bland [address], [place]  ° (336) 373-1557 Only accepts [place] Access Medicaid patients after they have their name applied to their card.  ° °Self-Pay (no insurance) in [place]: ° °Organization         Address  Phone   Notes  °Sickle Cell Patients, [facility] [address], [place] (336) 832-1970   °[facility] [address], [place] (336) 832-4400   °[facility] ° [address], [place] (336) 992-4800   °[facility]/Dr. Osei-Bonsu ° [address] [place] [address], [place] or [address], [place] (336) 841-8500 Phone number for both [place] and [place] locations is the same.  °[facility] [address], [place] (336) 299-0000   °[facility] [address] [place] [address], [place] or [address] (336) 852-7530 °(336) 878-2260   °[facility] [address] [place] [address], [place] (336) 350-1642, phone; (336) 294-5005, fax Sees patients 1st and 3rd Saturday of every month.  Must not qualify for public or private insurance (i.e. Medicaid, Medicare, [place] Health Choice, Veterans' Benefits) • Household income should be no more than 200% of the poverty level •The clinic cannot treat you if you are pregnant or think you are pregnant • Sexually transmitted diseases are not treated at the clinic.  ° ° °Dental Care: °Organization         Address  Phone  Notes  °[place] [facility] [address], [place] (336) 641-6152 Accepts children up to age 21 who are enrolled in Medicaid or [place] Health Choice; pregnant women with a Medicaid card; and children who have applied for Medicaid or [place] Health Choice, but were declined, whose parents can pay a reduced fee at time of service.  °[place]  [facility] [place]  [address], [place] (336) 641-7733 Accepts children up to age 21 who are enrolled in Medicaid or [place] Health Choice; pregnant women with a Medicaid card; and children who have applied for Medicaid or [place] Health Choice, but were declined, whose parents can pay a reduced fee at time of service.  °[facility] ° [address],  (336) 641-4533 Patients are seen by appointment only. Walk-ins are not accepted. [facility] will see patients 18 years of age and older. °Monday - Tuesday (8am-5pm) °Most Wednesdays (8:30-5pm) °$30 per visit, cash only  °[facility] ° [address], [place] (336) 641-4533 Patients are seen by appointment only. Walk-ins are not accepted. [facility] will see patients 18 years of age and older. °One   Wednesday Evening (Monthly: Volunteer Based).  $30 per visit, cash only  °[facility]  (919) 537-3737 for adults; Children under age 4, call Graduate Pediatric Dentistry at (919) [phone]. Children aged 4-14, please call (919) [phone] to request a pediatric application. ° Dental services are provided in all areas of dental care including fillings, crowns and bridges, complete and partial dentures, implants, gum treatment, root canals, and extractions. Preventive care is also provided. Treatment is provided to both adults and children. °Patients are selected via a lottery and there is often a waiting list. °  °[facility] [address], °[place] ° (336) 763-8833 [email or web address] °  °[facility] [address], [place], [place] (336)723-1848, Ext. 123 Second and Fourth Thursday of each month, opens at 6:30 AM; Clinic ends at 9 AM.  Patients are seen on a first-come first-served basis, and a limited number are seen during each clinic.  ° °[facility] ° [address], [place], [place] (336) 723-7904    Eligibility Requirements °You must have lived in [place], [place], or [place] counties for at least the last three months. °  You cannot be eligible for state or federal sponsored healthcare insurance, including Veterans Administration, Medicaid, or Medicare. °  You generally cannot be eligible for healthcare insurance through your employer.  °  How to apply: °Eligibility screenings are held every Tuesday and Wednesday afternoon from 1:00 pm until 4:00 pm. You do not need an appointment for the interview!  °[facility] [address], [place], [place] 336-631-2330   °[facility] [facility]  336-342-8273   °[facility]  336-703-3100   °[facility]  336-570-6415   ° °Behavioral Health Resources in the Community: °Intensive Outpatient Programs °Organization         Address  Phone  Notes  °[facility] [address], [place], [place] 336-878-6098   °[facility] [address], [place], [place] 336-832-9800   °[facility] [address], [place], [place] ° 336-882-2125   °[facility] [address],  °[place], [place] 1-800-853-5163 or 336-641-4981   °Substance Abuse Resources °Organization         Address  Phone  Notes  °[facility]  336-882-2125   °[facility]  336-784-9470   °[facility]  336-285-9073   °[facility]  336-845-3988   °Residential & Outpatient Substance Abuse Program  1-800-659-3381   °Psychological Services °Organization         Address  Phone  Notes  °[facility]  336- 832-9600   °[facility]  336- 378-7881   °[facility] [address], [place] 1-800-853-5163 or 336-641-4981   ° °Mobile Crisis Teams °Organization         Address  Phone  Notes  °[facility], Mobile Crisis Care Unit  1-877-626-1772   °Assertive °[facility] ° [address]  [place], [place] 336-834-9664   °Sharon DeEsch [address] °[place] 336-554-5454   ° °Self-Help/Support Groups °Organization         Address  Phone             Notes  °[facility] - variety of support groups  336- 373-1402 Call for more information  °Narcotics Anonymous (NA), [facility] [address], °[place]   2 meetings at this location  ° °  Residential Treatment Programs °Organization         Address  Phone  Notes  °[facility] [address],    °[place]  1-866-801-8205   °[facility] ° [address], [place], [place] 704-293-8524   °[facility] [address], [place] 336-845-3988 Admissions: 8am-3pm M-F  °[facility] [address]    °[place], [place] 336-841-1104   °[facility] [address], [place], [place] 336-379-7146   °[facility] [address] [place] [address]  °[place], [place] 336-285-9073   °[facility] - Intensive Outpatient [address], [place], [place] 336-852-3033   °[facility] [address]  °[place], [place] 1-877-615-2722 or 336-784-9470   °[facility] [address] [place], [place] 336-227-7417 Accepts Medicaid  °[facility] [address]  °[place] 1-800-659-3381 Substance Abuse/Addiction Treatment  ° °[place] Behavioral Health Resources °Organization         Address  Phone  Notes  °[facility]  (888) 581-9988   °Julie Brannon, PhD [address] [place], [place]   (336) 349-5553 or (336) 951-0000   °[facility]   [address] °[place], [place] (336) 349-4454   °[facility] [address], [place], [place] (336) 342-8316 Insurance/Medicaid/sponsorship through [facility]  °[facility] [address]                                    [place], [place] (336) 342-8316 Therapy/tele-psych/case    °[facility] [address]  ° [place], [place] (336) 349-2233    °Dr. Arfeen  (336) 349-4544   °[facility] [place]  [employer] [place] [facility] 1) [address], [place] °2) [address], [place] °3)  [address], [place] (336) 349-3220 °(336) 342-7768 ° °(336) 342-8140   °[place] [facility] (336) 342-1394 or (336) 342-3537 (After Hours)    ° ° °

## 2013-05-18 NOTE — ED Notes (Signed)
Pt c/o abd pain with nausea x 5 days after eating

## 2013-05-18 NOTE — ED Provider Notes (Signed)
CSN: [id number]     Arrival date & time 05/18/13  4098 History   First MD Initiated Contact with Patient 05/18/13 1132     Chief Complaint  Patient presents with  . Abdominal Pain  . Nausea   (Consider location/radiation/quality/duration/timing/severity/associated sxs/prior Treatment) HPI  22 year old male with abdominal pain. Since going on for approximately the past week. He has pain in his epigastrium and left upper quadrant. He describes a squeezing sensation in this area after eating. Symptoms started a few minutes after eating and drinking, but seem to be worse after eating solid food. Associated with nausea, no vomiting. Fevers or chills. No diarrhea. No urinary complaints. No history similar symptoms. Denies significant alcohol intake. He is a smoker. Has not tried taking anything for his symptoms.   Past Medical History  Diagnosis Date  . Kidney stones   . Bronchitis    Past Surgical History  Procedure Laterality Date  . Kidney stone surgery      lithortripsy  . Hand surgery     History reviewed. No pertinent family history. History  Substance Use Topics  . Smoking status: Current Every Day Smoker  . Smokeless tobacco: Not on file  . Alcohol Use: Yes    Review of Systems  All systems reviewed and negative, other than as noted in HPI.   Allergies  Review of patient's allergies indicates no known allergies.  Home Medications   Current Outpatient Rx  Name  Route  Sig  Dispense  Refill  . albuterol (PROVENTIL HFA;VENTOLIN HFA) 108 (90 BASE) MCG/ACT inhaler   Inhalation   Inhale 2 puffs into the lungs every 6 (six) hours as needed for wheezing or shortness of breath.         . famotidine (PEPCID) 20 MG tablet   Oral   Take 1 tablet (20 mg total) by mouth 2 (two) times daily.   60 tablet   0   . sucralfate (CARAFATE) 1 G tablet   Oral   Take 1 tablet (1 g total) by mouth 4 (four) times daily -  with meals and at bedtime.   30 tablet   0    BP 104/69   Pulse 88  Temp(Src) 97.3 F (36.3 C) (Oral)  Resp 20  SpO2 98% Physical Exam  Nursing note and vitals reviewed. Constitutional: He appears well-developed and well-nourished. No distress.  HENT:  Head: Normocephalic and atraumatic.  Eyes: Conjunctivae are normal. Right eye exhibits no discharge. Left eye exhibits no discharge.  Neck: Neck supple.  Cardiovascular: Normal rate, regular rhythm and normal heart sounds.  Exam reveals no gallop and no friction rub.   No murmur heard. Pulmonary/Chest: Effort normal and breath sounds normal. No respiratory distress.  Abdominal: Soft. He exhibits no distension. There is tenderness.  Mild tenderness in epigastrium without rebound or guarding. No distention.  Genitourinary:  No CVA tenderness  Musculoskeletal: He exhibits no edema and no tenderness.  Neurological: He is alert.  Skin: Skin is warm and dry.  Psychiatric: He has a normal mood and affect. His behavior is normal. Thought content normal.    ED Course  Procedures (including critical care time) Labs Review Labs Reviewed  COMPREHENSIVE METABOLIC PANEL - Abnormal; Notable for the following:    Glucose, Bld 141 (*)    Albumin 3.4 (*)    All other components within normal limits  CBC WITH DIFFERENTIAL  LIPASE, BLOOD   Imaging Review No results found.  EKG Interpretation   None  MDM   1. Abdominal pain    22 year old male with abdominal pain. Suspect peptic disease given patient's description of symptoms, location and worse shortly after eating. Has mild tenderness on exam but no peritoneal signs. Very low suspicion for emergent surgical process. Imaging likely very low yield. Labs reassuring. Will treat presumptively at this point. Return precautions were discussed. Outpatient followup. Patient counseled on the risks of smoking and specifically how it relates to GI issues.     Raeford Razor, MD 05/18/13 [phone]

## 2013-05-29 ENCOUNTER — Encounter (HOSPITAL_COMMUNITY): Payer: Self-pay | Admitting: Emergency Medicine

## 2013-05-29 ENCOUNTER — Emergency Department (HOSPITAL_COMMUNITY)
Admission: EM | Admit: 2013-05-29 | Discharge: 2013-05-29 | Disposition: A | Discharge status: Home / Self Care | Payer: Worker's Compensation | Attending: Emergency Medicine | Admitting: Emergency Medicine

## 2013-05-29 ENCOUNTER — Emergency Department (HOSPITAL_COMMUNITY): Payer: Worker's Compensation

## 2013-05-29 DIAGNOSIS — Y929 Unspecified place or not applicable: Secondary | ICD-10-CM | POA: Insufficient documentation

## 2013-05-29 DIAGNOSIS — X500XXA Overexertion from strenuous movement or load, initial encounter: Secondary | ICD-10-CM | POA: Insufficient documentation

## 2013-05-29 DIAGNOSIS — Z9889 Other specified postprocedural states: Secondary | ICD-10-CM | POA: Insufficient documentation

## 2013-05-29 DIAGNOSIS — S63041A Subluxation of carpometacarpal joint of right thumb, initial encounter: Secondary | ICD-10-CM

## 2013-05-29 DIAGNOSIS — S63056A Dislocation of other carpometacarpal joint of unspecified hand, initial encounter: Secondary | ICD-10-CM | POA: Insufficient documentation

## 2013-05-29 DIAGNOSIS — Y9389 Activity, other specified: Secondary | ICD-10-CM | POA: Insufficient documentation

## 2013-05-29 DIAGNOSIS — Z87442 Personal history of urinary calculi: Secondary | ICD-10-CM | POA: Insufficient documentation

## 2013-05-29 DIAGNOSIS — Z8709 Personal history of other diseases of the respiratory system: Secondary | ICD-10-CM | POA: Insufficient documentation

## 2013-05-29 DIAGNOSIS — F17200 Nicotine dependence, unspecified, uncomplicated: Secondary | ICD-10-CM | POA: Insufficient documentation

## 2013-05-29 DIAGNOSIS — Z79899 Other long term (current) drug therapy: Secondary | ICD-10-CM | POA: Insufficient documentation

## 2013-05-29 NOTE — Discharge Instructions (Signed)
Wear the splint until you are able to see the hand specialist. Apply ice to reduce the swelling.

## 2013-05-29 NOTE — ED Notes (Signed)
Pt. States he was a work and was pulling on a piece of plastic and his right thumb "popped out of place". Pt. States he popped it back into place. Pt. Hand is now swollen. Cap refill instant.

## 2013-05-29 NOTE — ED Provider Notes (Signed)
CSN: [id number]     Arrival date & time 05/29/13  0448 History   First MD Initiated Contact with Patient 05/29/13 [phone]     Chief Complaint  Patient presents with  . Hand Injury   (Consider location/radiation/quality/duration/timing/severity/associated sxs/prior Treatment) Patient is a 23 y.o. male presenting with hand injury. The history is provided by the patient.  Hand Injury He states that he was pulling on a sheet of plastic when his right thumb popped out of place. He popped it back into place but he notices that if he hyperextends his thumb, it will pop out again. He denies pain in that area but he states that he has had no sensation in that hand following carpal tunnel surgery 9 months ago. He has noted some swelling in the hand so he came in to be evaluated. He denies other injury.  Past Medical History  Diagnosis Date  . Kidney stones   . Bronchitis    Past Surgical History  Procedure Laterality Date  . Kidney stone surgery      lithortripsy  . Hand surgery     No family history on file. History  Substance Use Topics  . Smoking status: Current Every Day Smoker  . Smokeless tobacco: Not on file  . Alcohol Use: Yes    Review of Systems  All other systems reviewed and are negative.    Allergies  Review of patient's allergies indicates no known allergies.  Home Medications   Current Outpatient Rx  Name  Route  Sig  Dispense  Refill  . albuterol (PROVENTIL HFA;VENTOLIN HFA) 108 (90 BASE) MCG/ACT inhaler   Inhalation   Inhale 2 puffs into the lungs every 6 (six) hours as needed for wheezing or shortness of breath.         . famotidine (PEPCID) 20 MG tablet   Oral   Take 1 tablet (20 mg total) by mouth 2 (two) times daily.   60 tablet   0   . sucralfate (CARAFATE) 1 G tablet   Oral   Take 1 tablet (1 g total) by mouth 4 (four) times daily -  with meals and at bedtime.   30 tablet   0    BP 135/68  Pulse 86  Temp(Src) 97.9 F (36.6 C) (Oral)  Resp 16   SpO2 99% Physical Exam  Nursing note and vitals reviewed.  23 year old male, resting comfortably and in no acute distress. Vital signs are normal. Oxygen saturation is 99%, which is normal. Head is normocephalic and atraumatic. PERRLA, EOMI. Oropharynx is clear. Neck is nontender and supple without adenopathy or JVD. Back is nontender and there is no CVA tenderness. Lungs are clear without rales, wheezes, or rhonchi. Chest is nontender. Heart has regular rate and rhythm without murmur. Abdomen is soft, flat, nontender without masses or hepatosplenomegaly and peristalsis is normoactive. Extremities: There is mild swelling of the thenar eminence of the right hand. There is no instability at the MCP joint or IPJ of the thumb. Capillary refill is prompt. He has virtually no sensation throughout his hand, so neurovascular status cannot be checked any further. Skin is warm and dry without rash. Neurologic: Mental status is normal, cranial nerves are intact, there are no other motor or sensory deficits.  ED Course  Procedures (including critical care time) Imaging Review Dg Finger Thumb Right  05/29/2013   CLINICAL DATA:  Right thumb injury, pain.  EXAM: RIGHT THUMB 2+V  COMPARISON:  None.  FINDINGS: Imaged bones, joints  and soft tissues appear normal.  IMPRESSION: Negative exam.   Electronically Signed   By: Drusilla Kanner M.D.   On: 05/29/2013 06:26   Images viewed by me.  MDM   1. Subluxation of carpometacarpal joint of right thumb, initial encounter    Subluxation of the right first MCP joint. He'll be sent for x-rays and will need followup with hand surgery.  X-rays are negative. He is placed in a thumb splint to give it support and he is referred to hand surgery for followup.  Dione Booze, MD 05/29/13 [phone]

## 2013-05-29 NOTE — ED Notes (Signed)
Pt. States does not have feeling in right hand due to carpal tunnel surgery.

## 2013-10-19 ENCOUNTER — Emergency Department (HOSPITAL_COMMUNITY)
Admission: EM | Admit: 2013-10-19 | Discharge: 2013-10-19 | Disposition: A | Discharge status: Home / Self Care | Payer: BC Managed Care – PPO | Attending: Emergency Medicine | Admitting: Emergency Medicine

## 2013-10-19 DIAGNOSIS — Y929 Unspecified place or not applicable: Secondary | ICD-10-CM | POA: Insufficient documentation

## 2013-10-19 DIAGNOSIS — Z87442 Personal history of urinary calculi: Secondary | ICD-10-CM | POA: Insufficient documentation

## 2013-10-19 DIAGNOSIS — Y9389 Activity, other specified: Secondary | ICD-10-CM | POA: Insufficient documentation

## 2013-10-19 DIAGNOSIS — Z8709 Personal history of other diseases of the respiratory system: Secondary | ICD-10-CM | POA: Insufficient documentation

## 2013-10-19 DIAGNOSIS — S61209A Unspecified open wound of unspecified finger without damage to nail, initial encounter: Secondary | ICD-10-CM | POA: Insufficient documentation

## 2013-10-19 DIAGNOSIS — W268XXA Contact with other sharp object(s), not elsewhere classified, initial encounter: Secondary | ICD-10-CM | POA: Insufficient documentation

## 2013-10-19 DIAGNOSIS — Z9889 Other specified postprocedural states: Secondary | ICD-10-CM | POA: Insufficient documentation

## 2013-10-19 DIAGNOSIS — F17200 Nicotine dependence, unspecified, uncomplicated: Secondary | ICD-10-CM | POA: Insufficient documentation

## 2013-10-19 DIAGNOSIS — IMO0002 Reserved for concepts with insufficient information to code with codable children: Secondary | ICD-10-CM

## 2013-10-19 NOTE — Discharge Instructions (Signed)

## 2013-10-19 NOTE — ED Notes (Signed)
Pt states that he cut his R posterior hand while working on a car. Small lac. Bleeding controlled. Tetanus < 5 years.

## 2013-10-19 NOTE — ED Provider Notes (Signed)
CSN: [id number]     Arrival date & time 10/19/13  1711 History  This chart was scribed for non-physician practitioner, Roxy Horseman, PA-C working with Rolan Bucco, MD by Greggory Stallion, ED scribe. This patient was seen in room WTR6/WTR6 and the patient's care was started at 6:10 PM.   Chief Complaint  Patient presents with  . Extremity Laceration   The history is provided by the patient. No language interpreter was used.   HPI Comments: Jacob Nixon. is a 23 y.o. male who presents to the Emergency Department complaining of right hand laceration that occurred earlier today around 5 PM. Pt was working on a car and accidentally cut his hand. His last tetanus was less than 5 years ago. Pt has history of carpal tunnel surgery on his right hand.   Past Medical History  Diagnosis Date  . Kidney stones   . Bronchitis    Past Surgical History  Procedure Laterality Date  . Kidney stone surgery      lithortripsy  . Hand surgery     No family history on file. History  Substance Use Topics  . Smoking status: Current Every Day Smoker  . Smokeless tobacco: Not on file  . Alcohol Use: Yes    Review of Systems  Constitutional: Negative for fever.  HENT: Negative for congestion.   Eyes: Negative for redness.  Respiratory: Negative for shortness of breath.   Cardiovascular: Negative for chest pain.  Gastrointestinal: Negative for abdominal distention.  Musculoskeletal: Negative for gait problem.  Skin: Positive for wound.  Neurological: Negative for speech difficulty.  Psychiatric/Behavioral: Negative for confusion.   Allergies  Review of patient's allergies indicates no known allergies.  Home Medications   Prior to Admission medications   Medication Sig Start Date End Date Taking? Authorizing Provider  HYDROCODONE-ACETAMINOPHEN PO Take 1 tablet by mouth once.   Yes Historical Provider, MD   BP 115/79  Pulse 102  Temp(Src) 98.7 F (37.1 C) (Oral)  SpO2  99%  Physical Exam  Nursing note and vitals reviewed. Constitutional: He is oriented to person, place, and time. He appears well-developed. No distress.  HENT:  Head: Normocephalic and atraumatic.  Eyes: Conjunctivae and EOM are normal.  Cardiovascular: Normal rate and regular rhythm.   Pulmonary/Chest: Effort normal. No stridor. No respiratory distress.  Abdominal: He exhibits no distension.  Musculoskeletal: He exhibits no edema.       Hands: Right fourth finger extension 5/5. No evidence of tendon involvement.   Neurological: He is alert and oriented to person, place, and time.  Skin: Skin is warm and dry.  1 cm laceration to the posterior right fourth finger near the MCP as diagramed. No retained foreign body.   Psychiatric: He has a normal mood and affect.    ED Course  Procedures (including critical care time)  DIAGNOSTIC STUDIES: Oxygen Saturation is 99% on RA, normal by my interpretation.    COORDINATION OF CARE: 6:11 PM-Discussed treatment plan which includes laceration repair with pt at bedside and pt agreed to plan.   LACERATION REPAIR PROCEDURE NOTE The patient's identification was confirmed and consent was obtained. This procedure was performed by Wonda Olds, PA-S, under direct supervision of Roxy Horseman, PA-C at 6:14 PM. Site: posterior right fourth finger Sterile procedures observed Anesthetic used (type and amt): 1 mL 2% lidocaine with epi Suture type/size:5-0 prolene Length: 1 cm # of Sutures: 4 Technique: simple interrupted Complexity: simple Antibx ointment applied Tetanus UTD Site anesthetized, irrigated with NS, explored  without evidence of foreign body, wound well approximated, site covered with dry, sterile dressing.  Patient tolerated procedure well without complications. Instructions for care discussed verbally and patient provided with additional written instructions for homecare and f/u.  Labs Review Labs Reviewed - No data to  display  Imaging Review No results found.   EKG Interpretation None      MDM   Final diagnoses:  Laceration    Patient with simple laceration to the finger. No evidence of tendon involvement. No foreign body. Laceration repaired in the ED. Tetanus is up-to-date. Sutures out in 10 days. Return percussion skin. Patient understands and agrees to plan. He is stable and ready for discharge.  I personally performed the services described in this documentation, which was scribed in my presence. The recorded information has been reviewed and is accurate.  Roxy Horsemanobert Modestine Scherzinger, PA-C 10/19/13 [phone]

## 2013-10-19 NOTE — ED Provider Notes (Signed)
Medical screening examination/treatment/procedure(s) were performed by non-physician practitioner and as supervising physician I was immediately available for consultation/collaboration.   EKG Interpretation None        Kimmie Doren, MD 10/19/13 2333 

## 2014-01-15 ENCOUNTER — Emergency Department (HOSPITAL_COMMUNITY)
Admission: EM | Admit: 2014-01-15 | Discharge: 2014-01-16 | Disposition: A | Discharge status: Home / Self Care | Payer: BC Managed Care – PPO | Attending: Emergency Medicine | Admitting: Emergency Medicine

## 2014-01-15 ENCOUNTER — Emergency Department (HOSPITAL_COMMUNITY): Payer: BC Managed Care – PPO

## 2014-01-15 ENCOUNTER — Encounter (HOSPITAL_COMMUNITY): Payer: Self-pay | Admitting: Emergency Medicine

## 2014-01-15 ENCOUNTER — Emergency Department (HOSPITAL_COMMUNITY)
Admission: EM | Admit: 2014-01-15 | Discharge: 2014-01-15 | Discharge status: Against Medical Advice | Payer: BC Managed Care – PPO | Attending: Emergency Medicine | Admitting: Emergency Medicine

## 2014-01-15 DIAGNOSIS — N200 Calculus of kidney: Secondary | ICD-10-CM

## 2014-01-15 DIAGNOSIS — Z8709 Personal history of other diseases of the respiratory system: Secondary | ICD-10-CM | POA: Insufficient documentation

## 2014-01-15 DIAGNOSIS — R109 Unspecified abdominal pain: Secondary | ICD-10-CM | POA: Insufficient documentation

## 2014-01-15 DIAGNOSIS — F17200 Nicotine dependence, unspecified, uncomplicated: Secondary | ICD-10-CM | POA: Insufficient documentation

## 2014-01-15 DIAGNOSIS — R112 Nausea with vomiting, unspecified: Secondary | ICD-10-CM | POA: Insufficient documentation

## 2014-01-15 DIAGNOSIS — N1330 Unspecified hydronephrosis: Secondary | ICD-10-CM | POA: Insufficient documentation

## 2014-01-15 LAB — COMPREHENSIVE METABOLIC PANEL
ALBUMIN: 3.2 g/dL — AB (ref 3.5–5.2)
ALT: 12 U/L (ref 0–53)
AST: 13 U/L (ref 0–37)
Alkaline Phosphatase: 90 U/L (ref 39–117)
Anion gap: 10 (ref 5–15)
BUN: 7 mg/dL (ref 6–23)
CHLORIDE: 102 meq/L (ref 96–112)
CO2: 26 mEq/L (ref 19–32)
CREATININE: 0.92 mg/dL (ref 0.50–1.35)
Calcium: 9.2 mg/dL (ref 8.4–10.5)
GFR calc Af Amer: >90 mL/min (ref >90)
GFR calc non Af Amer: >90 mL/min (ref >90)
Glucose, Bld: 83 mg/dL (ref 70–99)
Potassium: 4 mEq/L (ref 3.7–5.3)
Sodium: 138 mEq/L (ref 137–147)
TOTAL PROTEIN: 8.4 g/dL — AB (ref 6.0–8.3)
Total Bilirubin: 0.4 mg/dL (ref 0.3–1.2)

## 2014-01-15 LAB — URINALYSIS, ROUTINE W REFLEX MICROSCOPIC
Bilirubin Urine: NEGATIVE
Glucose, UA: NEGATIVE mg/dL
KETONES UR: NEGATIVE mg/dL
LEUKOCYTES UA: NEGATIVE
NITRITE: NEGATIVE
PH: 6 (ref 5.0–8.0)
Protein, ur: NEGATIVE mg/dL
Specific Gravity, Urine: 1.016 (ref 1.005–1.030)
Urobilinogen, UA: 1 mg/dL (ref 0.0–1.0)

## 2014-01-15 LAB — CBC WITH DIFFERENTIAL/PLATELET
BASOS ABS: 0 10*3/uL (ref 0.0–0.1)
BASOS PCT: 0 % (ref 0–1)
EOS ABS: 0.1 10*3/uL (ref 0.0–0.7)
Eosinophils Relative: 1 % (ref 0–5)
HEMATOCRIT: 40.4 % (ref 39.0–52.0)
HEMOGLOBIN: 13.8 g/dL (ref 13.0–17.0)
Lymphocytes Relative: 29 % (ref 12–46)
Lymphs Abs: 3.1 10*3/uL (ref 0.7–4.0)
MCH: 29.3 pg (ref 26.0–34.0)
MCHC: 34.2 g/dL (ref 30.0–36.0)
MCV: 85.8 fL (ref 78.0–100.0)
MONO ABS: 1 10*3/uL (ref 0.1–1.0)
MONOS PCT: 9 % (ref 3–12)
NEUTROS ABS: 6.5 10*3/uL (ref 1.7–7.7)
Neutrophils Relative %: 61 % (ref 43–77)
Platelets: 349 10*3/uL (ref 150–400)
RBC: 4.71 MIL/uL (ref 4.22–5.81)
RDW: 13.1 % (ref 11.5–15.5)
WBC: 10.7 10*3/uL — ABNORMAL HIGH (ref 4.0–10.5)

## 2014-01-15 LAB — URINE MICROSCOPIC-ADD ON

## 2014-01-15 MED ORDER — KETOROLAC TROMETHAMINE 30 MG/ML IJ SOLN
30.0000 mg | Freq: Once | INTRAMUSCULAR | Status: AC
  Administered 2014-01-15: 30 mg via INTRAVENOUS
  Filled 2014-01-15: qty 1

## 2014-01-15 MED ORDER — ONDANSETRON HCL 4 MG/2ML IJ SOLN
4.0000 mg | Freq: Once | INTRAMUSCULAR | Status: AC
  Administered 2014-01-15: 4 mg via INTRAVENOUS
  Filled 2014-01-15: qty 2

## 2014-01-15 NOTE — ED Notes (Signed)
Unable to void  NOW, aware of need for UA

## 2014-01-15 NOTE — ED Notes (Signed)
Pt arrived to the ED with a complaint of left sided flank pian.  Pt states pain started a couple of hours ago.  Pt states he has emesis episodes due to the pain.

## 2014-01-15 NOTE — ED Notes (Signed)
He states hes had L sided flank/back pain for 1 hour. Feels like when he had a kidney stone. He reports difficulty urinating.

## 2014-01-15 NOTE — ED Provider Notes (Signed)
CSN: [id number]     Arrival date & time 01/15/14  2136 History   First MD Initiated Contact with Patient 01/15/14 2231     Chief Complaint  Patient presents with  . Flank Pain     (Consider location/radiation/quality/duration/timing/severity/associated sxs/prior Treatment) HPI Comments: Patient presents to the emergency department with chief complaint of left flank pain. He states the pain started a couple of hours ago. He reports associated nausea and vomiting. Patient has a history of kidney stones. He states that this feels similar. He states the pain radiates to his groin. He reports difficulty with urination. Denies any fevers or chills. He has not taken anything to alleviate his symptoms. He has had to have lithotripsy in the past.  The history is provided by the patient. No language interpreter was used.    Past Medical History  Diagnosis Date  . Kidney stones   . Bronchitis    Past Surgical History  Procedure Laterality Date  . Kidney stone surgery      lithortripsy  . Hand surgery     History reviewed. No pertinent family history. History  Substance Use Topics  . Smoking status: Current Every Day Smoker  . Smokeless tobacco: Not on file  . Alcohol Use: Yes    Review of Systems  Constitutional: Negative for fever and chills.  Respiratory: Negative for shortness of breath.   Cardiovascular: Negative for chest pain.  Gastrointestinal: Positive for nausea and vomiting. Negative for diarrhea and constipation.  Genitourinary: Positive for flank pain. Negative for dysuria.  All other systems reviewed and are negative.     Allergies  Review of patient's allergies indicates no known allergies.  Home Medications   Prior to Admission medications   Not on File   BP 140/77  Pulse 81  Temp(Src) 98.1 F (36.7 C) (Oral)  Resp 16  SpO2 100% Physical Exam  Nursing note and vitals reviewed. Constitutional: He is oriented to person, place, and time. He appears  well-developed and well-nourished.  HENT:  Head: Normocephalic and atraumatic.  Eyes: Conjunctivae and EOM are normal. Pupils are equal, round, and reactive to light. Right eye exhibits no discharge. Left eye exhibits no discharge. No scleral icterus.  Neck: Normal range of motion. Neck supple. No JVD present.  Cardiovascular: Normal rate, regular rhythm and normal heart sounds.  Exam reveals no gallop and no friction rub.   No murmur heard. Pulmonary/Chest: Effort normal and breath sounds normal. No respiratory distress. He has no wheezes. He has no rales. He exhibits no tenderness.  Abdominal: Soft. He exhibits no distension and no mass. There is no tenderness. There is no rebound and no guarding.  Left CVA tenderness, no focal abdominal tenderness  Musculoskeletal: Normal range of motion. He exhibits no edema and no tenderness.  Neurological: He is alert and oriented to person, place, and time.  Skin: Skin is warm and dry.  Psychiatric: He has a normal mood and affect. His behavior is normal. Judgment and thought content normal.    ED Course  Procedures (including critical care time) Results for orders placed during the hospital encounter of 01/15/14  URINALYSIS, ROUTINE W REFLEX MICROSCOPIC      Result Value Ref Range   Color, Urine YELLOW  YELLOW   APPearance CLEAR  CLEAR   Specific Gravity, Urine 1.016  1.005 - 1.030   pH 6.0  5.0 - 8.0   Glucose, UA NEGATIVE  NEGATIVE mg/dL   Hgb urine dipstick MODERATE (*) NEGATIVE   Bilirubin  Urine NEGATIVE  NEGATIVE   Ketones, ur NEGATIVE  NEGATIVE mg/dL   Protein, ur NEGATIVE  NEGATIVE mg/dL   Urobilinogen, UA 1.0  0.0 - 1.0 mg/dL   Nitrite NEGATIVE  NEGATIVE   Leukocytes, UA NEGATIVE  NEGATIVE  URINE MICROSCOPIC-ADD ON      Result Value Ref Range   WBC, UA 0-2  <3 WBC/hpf   RBC / HPF 3-6  <3 RBC/hpf   Bacteria, UA RARE  RARE   Urine-Other MUCOUS PRESENT     Ct Abdomen Pelvis Wo Contrast  01/15/2014   CLINICAL DATA:  Left-sided  flank pain.  History of kidney stents.  EXAM: CT ABDOMEN AND PELVIS WITHOUT CONTRAST  TECHNIQUE: Multidetector CT imaging of the abdomen and pelvis was performed following the standard protocol without IV contrast.  COMPARISON:  One-view abdomen 01/09/2013. CT abdomen and pelvis without contrast 06/19/2012.  FINDINGS: The lung bases are clear without focal nodule, mass, or airspace disease. The heart size is normal. No significant pleural or pericardial effusion is present.  The liver and spleen are within normal limits. The stomach, duodenum, and pancreas are within normal limits is well. The common bile duct and gallbladder are normal. The adrenal glands are normal bilaterally.  A 7 mm nonobstructing stone is present near the upper pole of the right kidney. No additional right-sided stones are present. A punctate nonobstructing stone is noted at the upper pole of the left kidney. A 4.5 mm obstructing stone is present at the left UPJ. The more distal left ureter and within normal limits. The right ureter is unremarkable. A 3 mm density lies dependently in the urinary bladder. This may represent a recently passed stone.  The rectus sigmoid colon is mostly collapsed. The more proximal colon is within normal limits. The appendix is visualized and normal. Small bowel is unremarkable. No significant adenopathy or free fluid is present.  Bone windows are unremarkable.  IMPRESSION: 1. Obstructing 4.5 mm stone at the left UPJ with mild left-sided hydronephrosis. 2. 3 mm density dependently in the urinary bladder may represent a recently passed stone. 3. Punctate nonobstructing stone at the upper pole of the left kidney. 4. 7 mm nonobstructing stone at the upper pole of the right kidney.   Electronically Signed   By: Gennette Pac M.D.   On: 01/15/2014 23:59      EKG Interpretation None      MDM   Final diagnoses:  Kidney stone    Patient with K4 0.5 mm obstructing kidney stone in the left UPJ with mild  hydronephrosis. A recently passed a 3 mm density in the bladder which could be a recently passed stone.  Patient is feeling much better with Toradol.  Will discharge to home with pain medicine, Zofran, and urology followup. No evidence of UTI. Patient understands and agrees to plan. He is stable and ready for discharge.   Roxy Horseman, PA-C 01/16/14 [phone]

## 2014-01-16 MED ORDER — ONDANSETRON HCL 4 MG PO TABS: 4.0000 mg | ORAL_TABLET | Freq: Four times a day (QID) | ORAL | Status: DC

## 2014-01-16 MED ORDER — OXYCODONE-ACETAMINOPHEN 5-325 MG PO TABS: 2.0000 | ORAL_TABLET | Freq: Four times a day (QID) | ORAL | Status: DC | PRN

## 2014-01-16 MED ORDER — TAMSULOSIN HCL 0.4 MG PO CAPS: 0.4000 mg | ORAL_CAPSULE | Freq: Two times a day (BID) | ORAL | Status: DC

## 2014-01-16 MED ORDER — MORPHINE SULFATE 4 MG/ML IJ SOLN
4.0000 mg | Freq: Once | INTRAMUSCULAR | Status: AC
  Administered 2014-01-16: 4 mg via INTRAVENOUS
  Filled 2014-01-16: qty 1

## 2014-01-16 NOTE — ED Provider Notes (Signed)
Medical screening examination/treatment/procedure(s) were performed by non-physician practitioner and as supervising physician I was immediately available for consultation/collaboration.   EKG Interpretation None        Channon Ambrosini L Skylee Baird, MD 01/16/14 0629 

## 2014-01-16 NOTE — Discharge Instructions (Signed)

## 2014-07-05 ENCOUNTER — Encounter (HOSPITAL_COMMUNITY): Payer: Self-pay | Admitting: Emergency Medicine

## 2014-07-05 ENCOUNTER — Emergency Department (HOSPITAL_COMMUNITY): Payer: Self-pay

## 2014-07-05 ENCOUNTER — Emergency Department (HOSPITAL_COMMUNITY)
Admission: EM | Admit: 2014-07-05 | Discharge: 2014-07-05 | Disposition: A | Discharge status: Home / Self Care | Payer: Self-pay | Attending: Emergency Medicine | Admitting: Emergency Medicine

## 2014-07-05 DIAGNOSIS — N23 Unspecified renal colic: Secondary | ICD-10-CM

## 2014-07-05 DIAGNOSIS — Z87442 Personal history of urinary calculi: Secondary | ICD-10-CM | POA: Insufficient documentation

## 2014-07-05 DIAGNOSIS — Z8709 Personal history of other diseases of the respiratory system: Secondary | ICD-10-CM | POA: Insufficient documentation

## 2014-07-05 DIAGNOSIS — D72829 Elevated white blood cell count, unspecified: Secondary | ICD-10-CM | POA: Insufficient documentation

## 2014-07-05 DIAGNOSIS — Z720 Tobacco use: Secondary | ICD-10-CM | POA: Insufficient documentation

## 2014-07-05 DIAGNOSIS — N368 Other specified disorders of urethra: Secondary | ICD-10-CM | POA: Insufficient documentation

## 2014-07-05 LAB — URINE MICROSCOPIC-ADD ON

## 2014-07-05 LAB — URINALYSIS, ROUTINE W REFLEX MICROSCOPIC
GLUCOSE, UA: NEGATIVE mg/dL
KETONES UR: 15 mg/dL — AB
Nitrite: NEGATIVE
PROTEIN: 100 mg/dL — AB
Specific Gravity, Urine: 1.027 (ref 1.005–1.030)
UROBILINOGEN UA: 1 mg/dL (ref 0.0–1.0)
pH: 5.5 (ref 5.0–8.0)

## 2014-07-05 LAB — CBC WITH DIFFERENTIAL/PLATELET
Basophils Absolute: 0 10*3/uL (ref 0.0–0.1)
Basophils Relative: 0 % (ref 0–1)
Eosinophils Absolute: 0 10*3/uL (ref 0.0–0.7)
Eosinophils Relative: 0 % (ref 0–5)
HEMATOCRIT: 42.2 % (ref 39.0–52.0)
Hemoglobin: 14.6 g/dL (ref 13.0–17.0)
LYMPHS PCT: 17 % (ref 12–46)
Lymphs Abs: 2.3 10*3/uL (ref 0.7–4.0)
MCH: 28.9 pg (ref 26.0–34.0)
MCHC: 34.6 g/dL (ref 30.0–36.0)
MCV: 83.6 fL (ref 78.0–100.0)
MONO ABS: 0.5 10*3/uL (ref 0.1–1.0)
Monocytes Relative: 4 % (ref 3–12)
NEUTROS ABS: 10.3 10*3/uL — AB (ref 1.7–7.7)
Neutrophils Relative %: 79 % — ABNORMAL HIGH (ref 43–77)
Platelets: 323 10*3/uL (ref 150–400)
RBC: 5.05 MIL/uL (ref 4.22–5.81)
RDW: 13.6 % (ref 11.5–15.5)
WBC: 13.2 10*3/uL — AB (ref 4.0–10.5)

## 2014-07-05 LAB — COMPREHENSIVE METABOLIC PANEL
ALBUMIN: 3.6 g/dL (ref 3.5–5.2)
ALT: 15 U/L (ref 0–53)
ANION GAP: 8 (ref 5–15)
AST: 16 U/L (ref 0–37)
Alkaline Phosphatase: 87 U/L (ref 39–117)
BILIRUBIN TOTAL: 0.6 mg/dL (ref 0.3–1.2)
BUN: 8 mg/dL (ref 6–23)
CO2: 24 mmol/L (ref 19–32)
CREATININE: 0.85 mg/dL (ref 0.50–1.35)
Calcium: 9.1 mg/dL (ref 8.4–10.5)
Chloride: 103 mmol/L (ref 96–112)
GFR calc Af Amer: >90 mL/min (ref >90)
GFR calc non Af Amer: >90 mL/min (ref >90)
Glucose, Bld: 126 mg/dL — ABNORMAL HIGH (ref 70–99)
Potassium: 3.9 mmol/L (ref 3.5–5.1)
Sodium: 135 mmol/L (ref 135–145)
TOTAL PROTEIN: 7.6 g/dL (ref 6.0–8.3)

## 2014-07-05 MED ORDER — CEPHALEXIN 500 MG PO CAPS: 500.0000 mg | ORAL_CAPSULE | Freq: Three times a day (TID) | ORAL | Status: DC

## 2014-07-05 MED ORDER — FENTANYL CITRATE 0.05 MG/ML IJ SOLN
100.0000 ug | Freq: Once | INTRAMUSCULAR | Status: AC
  Administered 2014-07-05: 100 ug via INTRAVENOUS
  Filled 2014-07-05: qty 2

## 2014-07-05 MED ORDER — OXYCODONE-ACETAMINOPHEN 5-325 MG PO TABS: 2.0000 | ORAL_TABLET | ORAL | Status: DC | PRN

## 2014-07-05 MED ORDER — ONDANSETRON 4 MG PO TBDP
4.0000 mg | ORAL_TABLET | Freq: Once | ORAL | Status: AC
  Administered 2014-07-05: 4 mg via ORAL
  Filled 2014-07-05: qty 1

## 2014-07-05 MED ORDER — OXYCODONE-ACETAMINOPHEN 5-325 MG PO TABS
1.0000 | ORAL_TABLET | Freq: Once | ORAL | Status: DC
  Filled 2014-07-05: qty 1

## 2014-07-05 MED ORDER — HYDROMORPHONE HCL 1 MG/ML IJ SOLN
0.5000 mg | Freq: Once | INTRAMUSCULAR | Status: AC
  Administered 2014-07-05: 0.5 mg via INTRAVENOUS
  Filled 2014-07-05: qty 1

## 2014-07-05 MED ORDER — TAMSULOSIN HCL 0.4 MG PO CAPS: 0.4000 mg | ORAL_CAPSULE | Freq: Every day | ORAL | Status: DC

## 2014-07-05 MED ORDER — IBUPROFEN 800 MG PO TABS: 800.0000 mg | ORAL_TABLET | Freq: Three times a day (TID) | ORAL | Status: DC

## 2014-07-05 NOTE — ED Notes (Signed)
Pt presents with right sided flank pain that has been intermittent for the past 2 weeks- admits that pain today is the worst he has felt.  Denies pain with urination or penile discharge.  Denies diarrhea, admits to nausea at times.

## 2014-07-05 NOTE — ED Notes (Signed)
Pt currently not wanting percocet, stated that it will not help with pain.

## 2014-07-05 NOTE — Discharge Instructions (Signed)
Kidney Stones °Kidney stones (urolithiasis) are deposits that form inside your kidneys. The intense pain is caused by the stone moving through the urinary tract. When the stone moves, the ureter goes into spasm around the stone. The stone is usually passed in the urine.  °CAUSES  °· A disorder that makes certain neck glands produce too much parathyroid hormone (primary hyperparathyroidism). °· A buildup of uric acid crystals, similar to gout in your joints. °· Narrowing (stricture) of the ureter. °· A kidney obstruction present at birth (congenital obstruction). °· Previous surgery on the kidney or ureters. °· Numerous kidney infections. °SYMPTOMS  °· Feeling sick to your stomach (nauseous). °· Throwing up (vomiting). °· Blood in the urine (hematuria). °· Pain that usually spreads (radiates) to the groin. °· Frequency or urgency of urination. °DIAGNOSIS  °· Taking a history and physical exam. °· Blood or urine tests. °· CT scan. °· Occasionally, an examination of the inside of the urinary bladder (cystoscopy) is performed. °TREATMENT  °· Observation. °· Increasing your fluid intake. °· Extracorporeal shock wave lithotripsy--This is a noninvasive procedure that uses shock waves to break up kidney stones. °· Surgery may be needed if you have severe pain or persistent obstruction. There are various surgical procedures. Most of the procedures are performed with the use of small instruments. Only small incisions are needed to accommodate these instruments, so recovery time is minimized. °The size, location, and chemical composition are all important variables that will determine the proper choice of action for you. Talk to your health care provider to better understand your situation so that you will minimize the risk of injury to yourself and your kidney.  °HOME CARE INSTRUCTIONS  °· Drink enough water and fluids to keep your urine clear or pale yellow. This will help you to pass the stone or stone fragments. °· Strain  all urine through the provided strainer. Keep all particulate matter and stones for your health care provider to see. The stone causing the pain may be as small as a grain of salt. It is very important to use the strainer each and every time you pass your urine. The collection of your stone will allow your health care provider to analyze it and verify that a stone has actually passed. The stone analysis will often identify what you can do to reduce the incidence of recurrences. °· Only take over-the-counter or prescription medicines for pain, discomfort, or fever as directed by your health care provider. °· Make a follow-up appointment with your health care provider as directed. °· Get follow-up X-rays if required. The absence of pain does not always mean that the stone has passed. It may have only stopped moving. If the urine remains completely obstructed, it can cause loss of kidney function or even complete destruction of the kidney. It is your responsibility to make sure X-rays and follow-ups are completed. Ultrasounds of the kidney can show blockages and the status of the kidney. Ultrasounds are not associated with any radiation and can be performed easily in a matter of minutes. °SEEK MEDICAL CARE IF: °· You experience pain that is progressive and unresponsive to any pain medicine you have been prescribed. °SEEK IMMEDIATE MEDICAL CARE IF:  °· Pain cannot be controlled with the prescribed medicine. °· You have a fever or shaking chills. °· The severity or intensity of pain increases over 18 hours and is not relieved by pain medicine. °· You develop a new onset of abdominal pain. °· You feel faint or pass out. °·   You are unable to urinate. °MAKE SURE YOU:  °· Understand these instructions. °· Will watch your condition. °· Will get help right away if you are not doing well or get worse. °Document Released: 05/06/2005 Document Revised: 01/06/2013 Document Reviewed: 10/07/2012 °ExitCare® Patient Information ©2015  [employer]. This information is not intended to replace advice given to you by your health care provider. Make sure you discuss any questions you have with your health care provider. ° ° ° °Emergency Department Resource Guide °1) Find a Doctor and Pay Out of Pocket °Although you won't have to find out who is covered by your insurance plan, it is a good idea to ask around and get recommendations. You will then need to call the office and see if the doctor you have chosen will accept you as a new patient and what types of options they offer for patients who are self-pay. Some doctors offer discounts or will set up payment plans for their patients who do not have insurance, but you will need to ask so you aren't surprised when you get to your appointment. ° °2) Contact Your Local Health Department °Not all health departments have doctors that can see patients for sick visits, but many do, so it is worth a call to see if yours does. If you don't know where your local health department is, you can check in your phone book. The CDC also has a tool to help you locate your state's health department, and many state websites also have listings of all of their local health departments. ° °3) Find a Walk-in Clinic °If your illness is not likely to be very severe or complicated, you may want to try a walk in clinic. These are popping up all over the country in pharmacies, drugstores, and shopping centers. They're usually staffed by nurse practitioners or physician assistants that have been trained to treat common illnesses and complaints. They're usually fairly quick and inexpensive. However, if you have serious medical issues or chronic medical problems, these are probably not your best option. ° °No Primary Care Doctor: °- Call [facility] at  832-8000 - they can help you locate a primary care doctor that  accepts your insurance, provides certain services, etc. °- Physician Referral Service- 1-800-533-3463 ° °Chronic  Pain Problems: °Organization         Address  Phone   Notes  °[facility]  (336) 297-2271 Patients need to be referred by their primary care doctor.  ° °Medication Assistance: °Organization         Address  Phone   Notes  °[place] [facility] [address] °[place], [place] [address] (336) 641-8030 --Must be a resident of [place] °-- Must have NO insurance coverage whatsoever (no Medicaid/ Medicare, etc.) °-- The pt. MUST have a primary care doctor that directs their care regularly and follows them in the community °  °[facility]  (866) 331-1348   °[employer]  (888) 892-1162   ° °Agencies that provide inexpensive medical care: °Organization         Address  Phone   Notes  °[facility]  (336) 832-8035   °[facility]    (336) 832-7272   °[facility] [address] °[place],  [address] (336) 832-4777   °[facility] [place] [address], °[place] (336) 271-4999   °[facility]    (336) 373-0678   °[facility]    (  336) 272-1050   °[facility] ° [address], [place] Phone:  (336) 832-4444, Fax:  (336) 832-4440 Hours of Operation:  9 am - 6 pm, M-F.  Also accepts Medicaid/Medicare and self-pay.  °[facility] ° [address], [place] Phone: (336) 832-3150, Fax: (336) 832-3151. Hours of Operation:  8:30 am - 5:30 pm, M-F.  Also accepts Medicaid and self-pay.  °[facility] [place] [address], [place] Phone: (336) 878-6027   °[facility] [address], [place], [place] (336)723-1848, Ext. 123 Mondays & Thursdays: 7-9 AM.  First 15 patients are seen on a first come, first serve basis. °  ° °Medicaid-accepting [place] Providers: ° °Organization         Address  Phone   Notes  °[facility] [address], [place] (336) 641-2100 Also  accepts self-pay patients.  °[facility] [address], [place] ° (336) 856-9996   °[facility] [address], [place] (336) 288-8857   °[facility] [address] [place] [address], [place] (336) 299-7000   °Veita Bland [address], [place]  ° (336) 373-1557 Only accepts  Access Medicaid patients after they have their name applied to their card.  ° °Self-Pay (no insurance) in [place]: ° °Organization         Address  Phone   Notes  °Sickle Cell Patients, [facility] [address], [place] (336) 832-1970   °[facility] [address], [place] (336) 832-4400   °[facility] [place] ° [address], [place] (336) 992-4800   °[facility]/Dr. Osei-Bonsu ° [address] [place] [address], [place] or [address], [place] (336) 841-8500 Phone number for both [place] and [place] locations is the same.  °[facility] [address], [place] (336) 299-0000   °[facility] [address] [place] [address], [place] or [address] (336) 852-7530 °(336) 878-2260   °[facility] [address],  (336) 350-1642, phone; (336) 294-5005, fax Sees patients 1st and 3rd Saturday of every month.  Must not qualify for public or private insurance (i.e. Medicaid, Medicare, [place] Health Choice, Veterans' Benefits) • Household income should be no more than 200% of the poverty level •The clinic cannot treat you if you are pregnant or think you are pregnant • Sexually transmitted diseases are not treated at the clinic.  ° ° °Dental Care: °Organization         Address  Phone  Notes  °[facility] [facility] [address],  (336) 641-6152 Accepts children up to age 21 who are enrolled in Medicaid or [place] Health Choice; pregnant  women with a Medicaid card; and children who have applied for Medicaid or [place] Health Choice, but were declined, whose parents can pay a reduced fee at time of service.  °[facility] [place]  [address], [place] (336) 641-7733 Accepts children up to age 21 who are enrolled in Medicaid or [place] Health Choice; pregnant women with a Medicaid card; and children who have applied for Medicaid or [place] Health Choice, but were declined, whose parents can pay a reduced fee at time of service.  °[facility] ° [address], [place] (336) 641-4533 Patients are seen by appointment only. Walk-ins are not accepted. [facility] will see patients 18 years of age and older. °Monday - Tuesday (8am-5pm) °Most Wednesdays (8:30-5pm) °$30 per visit, cash only  °[facility] ° [address], [place] (336) 641-4533 Patients are seen by appointment only. Walk-ins are not accepted. [facility] will see patients 18 years of age and older. °One Wednesday Evening (Monthly: Volunteer Based).  $30 per visit, cash only  °[facility]  (919) 537-3737 for adults; Children under age 4, call Graduate Pediatric Dentistry at ([phone]) [phone]. Children aged 4-14, please call (919) [phone] to request a pediatric application. ° Dental services are provided in all areas of dental care including fillings, crowns and bridges, complete and partial dentures, implants, gum treatment, root canals, and extractions. Preventive care is also provided. Treatment is provided to both adults and children. °Patients are selected via a lottery and there is often a waiting list. °  °[facility] [address], °[place] ° (336) 763-8833 [email or web address] °  °[facility] [address], [place], [place] (336)723-1848, Ext. 123 Second and Fourth Thursday of each month, opens at 6:30 AM; Clinic ends at 9 AM.  Patients are  seen on a first-come first-served basis, and a limited number are seen during each clinic.  ° °[facility] ° [address], [place], [place] (336) 723-7904   Eligibility Requirements °You must have lived in [place], [place], or [place] counties for at least the last three months. °  You cannot be eligible for state or federal sponsored healthcare insurance, including Veterans Administration, Medicaid, or Medicare. °  You generally cannot be eligible for healthcare insurance through your employer.  °  How to apply: °Eligibility screenings are held every Tuesday and Wednesday afternoon from 1:00 pm until 4:00 pm. You do not need an appointment for the interview!  °[facility] [address], [place], [place] 336-631-2330   °[facility]  336-342-8273   °[facility]  336-703-3100   °[facility]  336-570-6415   ° °Behavioral Health Resources in the Community: °Intensive Outpatient Programs °Organization         Address  Phone  Notes  °[facility] [address], [place], [place] 336-878-6098   °[facility] [address], [place], [place] 336-832-9800   °[facility] [address], [place], [place] ° 336-882-2125   °[facility] [address],  °[place], [place] 1-800-853-5163 or 336-641-4981   °Substance Abuse Resources °Organization         Address  Phone  Notes  °[facility]  336-882-2125   °[facility]  336-784-9470   °[facility]  336-285-9073   °[facility]  336-845-3988   °Residential & Outpatient Substance Abuse Program  1-800-659-3381   °Psychological Services °Organization         Address  Phone  Notes  ° Health  336- 832-9600   °[facility]  336- 378-7881   °[facility] [address], [place] 1-800-853-5163 or 336-641-4981   ° °Mobile Crisis  Teams °Organization         Address  Phone  Notes  °[facility], Mobile Crisis Care Unit  1-877-626-1772   °Assertive °[facility] ° [address] [place],   [place] 336-834-9664   °Sharon DeEsch [address] °[place] 336-554-5454   ° °Self-Help/Support Groups °Organization         Address  Phone             Notes  °[facility] - variety of support groups  336- 373-1402 Call for more information  °Narcotics Anonymous (NA), [facility] [address], °[place]  2 meetings at this location  ° °Residential Treatment Programs °Organization         Address  Phone  Notes  °[facility] [address],    °[place]   1-866-801-8205   °[facility] ° [address], [place], [place] 704-293-8524   °[facility] [address], [place] 336-845-3988 Admissions: 8am-3pm M-F  °[facility] [address]    °[place], [place] 336-841-1104   °[facility] [address], [place], [place] 336-379-7146   °[facility] [address]  °[place], [place] 336-285-9073   °[facility] - Intensive Outpatient [address], [place], [place] 336-852-3033   °[facility] [address]  °[place], [place] 1-877-615-2722 or 336-784-9470   °[facility] [address] [place], [place] 336-227-7417 Accepts Medicaid  °[facility] [address]  ° [place] 1-800-659-3381 Substance Abuse/Addiction Treatment  ° °[place] Behavioral Health Resources °Organization         Address  Phone  Notes  °[facility]  (888) 581-9988   °Julie Brannon, PhD [address] [place], [place]   (336) 349-5553 or (336) 951-0000   °[facility]   [address] °[place], [place] (336) 349-4454   °[facility] [address], [place], [place] (336) 342-8316  Insurance/Medicaid/sponsorship through [facility]  °[facility] [address] [place]                                    [place], [place] (336) 342-8316 Therapy/tele-psych/case  °[facility] [address]  ° [place], [place] (336) 349-2233    °Dr. Arfeen  (336) 349-4544   °[facility] [place]  [employer] [place] [facility] 1) [address],  °2) [address], [place] °3)  [address], [place] (336) 349-3220 °(336) 342-7768 ° °(336) 342-8140   °[place] [facility] (336) 342-1394 or (336) 342-3537 (After Hours)    ° ° ° °

## 2014-07-05 NOTE — ED Notes (Signed)
Pt updated on wait time.  

## 2014-07-05 NOTE — ED Provider Notes (Signed)
CSN: [id number]     Arrival date & time 07/05/14  1232 History   First MD Initiated Contact with Patient 07/05/14 1512     Chief Complaint  Patient presents with  . Flank Pain   HPI  Patient's 24 year old male with past medical history of kidney stones who presents emergency room for evaluation of right flank pain. Patient states he has been having right flank pain intermittently for the past 2 weeks. Today his pain became more constant. He states the pain radiates from his right flank through his abdomen to his right testicle. He feels that his right testicle is slightly tender. He states that he has had kidney stones on the left before. He's never had a kidney stone on the right. CT scan from 01/15/2014 reveals a single stationary 7 mm stone at the right pole of the kidney. Patient states he is been trying ibuprofen at home with no relief. He does admit to some nausea but no vomiting. He denies fevers, chills, diarrhea, melena, hematochezia. He does admit to some hematuria. He does not feel that he has had any frequency, urgency, or dysuria.  Past Medical History  Diagnosis Date  . Kidney stones   . Bronchitis    Past Surgical History  Procedure Laterality Date  . Kidney stone surgery      lithortripsy  . Hand surgery     No family history on file. History  Substance Use Topics  . Smoking status: Current Every Day Smoker  . Smokeless tobacco: Not on file  . Alcohol Use: Yes    Review of Systems  Constitutional: Negative for fever, chills and fatigue.  Gastrointestinal: Positive for nausea. Negative for vomiting, abdominal pain, diarrhea and constipation.  Genitourinary: Positive for hematuria, flank pain and testicular pain. Negative for dysuria, urgency, frequency, decreased urine volume, penile swelling, scrotal swelling, difficulty urinating and penile pain.  Musculoskeletal: Negative for back pain.  All other systems reviewed and are negative.     Allergies  Review of  patient's allergies indicates no known allergies.  Home Medications   Prior to Admission medications   Medication Sig Start Date End Date Taking? Authorizing Provider  cephALEXin (KEFLEX) 500 MG capsule Take 1 capsule (500 mg total) by mouth 3 (three) times daily. 07/05/14   Jewelia Bocchino A Forcucci, PA-C  ibuprofen (ADVIL,MOTRIN) 800 MG tablet Take 1 tablet (800 mg total) by mouth 3 (three) times daily. 07/05/14   Bay Jarquin A Forcucci, PA-C  ondansetron (ZOFRAN) 4 MG tablet Take 1 tablet (4 mg total) by mouth every 6 (six) hours. Patient not taking: Reported on 07/05/2014 01/16/14   Roxy Horseman, PA-C  oxyCODONE-acetaminophen (PERCOCET) 5-325 MG per tablet Take 2 tablets by mouth every 4 (four) hours as needed. 07/05/14   Sholonda Jobst A Forcucci, PA-C  tamsulosin (FLOMAX) 0.4 MG CAPS capsule Take 1 capsule (0.4 mg total) by mouth daily. 07/05/14   Temia Debroux A Forcucci, PA-C   BP 108/68 mmHg  Pulse 85  Temp(Src) 98.1 F (36.7 C) (Oral)  Resp 18  Ht  (1.803 m)  Wt 260 lb (117.935 kg)  BMI 36.28 kg/m2  SpO2 96% Physical Exam  Constitutional: He is oriented to person, place, and time. He appears well-developed and well-nourished. No distress.  HENT:  Head: Normocephalic and atraumatic.  Mouth/Throat: Oropharynx is clear and moist. No oropharyngeal exudate.  Eyes: Conjunctivae and EOM are normal. Pupils are equal, round, and reactive to light. No scleral icterus.  Neck: Normal range of motion. Neck supple. No JVD  present. No thyromegaly present.  Cardiovascular: Normal rate, regular rhythm, normal heart sounds and intact distal pulses.  Exam reveals no gallop and no friction rub.   No murmur heard. Pulmonary/Chest: Effort normal and breath sounds normal. No respiratory distress. He has no wheezes. He has no rales. He exhibits no tenderness.  Abdominal: Soft. Normal appearance and bowel sounds are normal. He exhibits no distension and no mass. There is no hepatosplenomegaly. There is tenderness in  the suprapubic area. There is CVA tenderness. There is no rigidity, no rebound, no guarding, no tenderness at McBurney's point and negative Murphy's sign.  Musculoskeletal: Normal range of motion.  Lymphadenopathy:    He has no cervical adenopathy.  Neurological: He is alert and oriented to person, place, and time.  Skin: Skin is warm and dry. He is not diaphoretic.  Psychiatric: He has a normal mood and affect. His behavior is normal. Judgment and thought content normal.  Nursing note and vitals reviewed.   ED Course  Procedures (including critical care time) Labs Review Labs Reviewed  URINALYSIS, ROUTINE W REFLEX MICROSCOPIC - Abnormal; Notable for the following:    Color, Urine AMBER (*)    APPearance TURBID (*)    Hgb urine dipstick LARGE (*)    Bilirubin Urine MODERATE (*)    Ketones, ur 15 (*)    Protein, ur 100 (*)    Leukocytes, UA SMALL (*)    All other components within normal limits  URINE MICROSCOPIC-ADD ON - Abnormal; Notable for the following:    Squamous Epithelial / LPF FEW (*)    Bacteria, UA FEW (*)    All other components within normal limits  CBC WITH DIFFERENTIAL/PLATELET - Abnormal; Notable for the following:    WBC 13.2 (*)    Neutrophils Relative % 79 (*)    Neutro Abs 10.3 (*)    All other components within normal limits  COMPREHENSIVE METABOLIC PANEL - Abnormal; Notable for the following:    Glucose, Bld 126 (*)    All other components within normal limits    Imaging Review [place]  07/05/2014   CLINICAL DATA:  Right flank pain for 2 weeks, elevated white blood cell count, history of calculi  EXAM: RENAL/URINARY TRACT ULTRASOUND COMPLETE  COMPARISON:  01/15/2014  FINDINGS: Right Kidney:  Length: 11.4 cm. Echogenicity within normal limits. No mass or hydronephrosis visualized.  Left Kidney:  Length: 12.0 cm. Echogenicity within normal limits. No mass or hydronephrosis visualized.  Bladder:  Appears normal for degree of bladder distention.  IMPRESSION:  No acute abnormality is identified. No hydronephrotic changes are seen. No definitive calculi are noted.   Electronically Signed   By: Alcide CleverMark  Lukens M.D.   On: 07/05/2014 18:28     EKG Interpretation None      MDM   Final diagnoses:  Ureteral colic   Patient's 24 year old male with past medical history of kidney stones who presents emergency room for evaluation of right flank pain. Patient does have significant hematuria here. He does have few bacteria which I think is likely secondary to contamination with few epithelial cells. CBC is mildly elevated with a leukocytosis. This may be reactive secondary to the kidney stone. CMP is unremarkable. Ultrasound of the right kidney reveals no hydronephrosis or visible stone. Patient is afebrile here. We'll discharge patient home with Keflex, Flomax, Percocet, and ibuprofen. I will also set the patient home with a strainer. I recommended that he follow up with [facility]. Patient to return for intractable pain,  difficulty with urinating, fever, or any other concerning symptoms. He states understanding and agreement at this time. Patient stable for discharge.    Eben Burow, PA-C 07/05/14 1901  Rolland Porter, MD 07/06/14 [phone]

## 2014-09-28 ENCOUNTER — Encounter (HOSPITAL_COMMUNITY): Payer: Self-pay | Admitting: Emergency Medicine

## 2014-09-28 ENCOUNTER — Emergency Department (HOSPITAL_COMMUNITY)
Admission: EM | Admit: 2014-09-28 | Discharge: 2014-09-28 | Disposition: A | Discharge status: Home / Self Care | Payer: Self-pay | Attending: Emergency Medicine | Admitting: Emergency Medicine

## 2014-09-28 ENCOUNTER — Emergency Department (HOSPITAL_COMMUNITY): Payer: Self-pay

## 2014-09-28 DIAGNOSIS — Z8709 Personal history of other diseases of the respiratory system: Secondary | ICD-10-CM | POA: Insufficient documentation

## 2014-09-28 DIAGNOSIS — Z720 Tobacco use: Secondary | ICD-10-CM | POA: Insufficient documentation

## 2014-09-28 DIAGNOSIS — Z792 Long term (current) use of antibiotics: Secondary | ICD-10-CM | POA: Insufficient documentation

## 2014-09-28 DIAGNOSIS — Z79899 Other long term (current) drug therapy: Secondary | ICD-10-CM | POA: Insufficient documentation

## 2014-09-28 DIAGNOSIS — N201 Calculus of ureter: Secondary | ICD-10-CM | POA: Insufficient documentation

## 2014-09-28 LAB — URINALYSIS, ROUTINE W REFLEX MICROSCOPIC
BILIRUBIN URINE: NEGATIVE
GLUCOSE, UA: NEGATIVE mg/dL
Ketones, ur: NEGATIVE mg/dL
Leukocytes, UA: NEGATIVE
NITRITE: NEGATIVE
PH: 5.5 (ref 5.0–8.0)
Protein, ur: NEGATIVE mg/dL
SPECIFIC GRAVITY, URINE: 1.025 (ref 1.005–1.030)
Urobilinogen, UA: 1 mg/dL (ref 0.0–1.0)

## 2014-09-28 LAB — CBC WITH DIFFERENTIAL/PLATELET
BASOS ABS: 0 10*3/uL (ref 0.0–0.1)
Basophils Relative: 0 % (ref 0–1)
Eosinophils Absolute: 0.2 10*3/uL (ref 0.0–0.7)
Eosinophils Relative: 2 % (ref 0–5)
HEMATOCRIT: 44.5 % (ref 39.0–52.0)
HEMOGLOBIN: 14.9 g/dL (ref 13.0–17.0)
LYMPHS ABS: 2.1 10*3/uL (ref 0.7–4.0)
Lymphocytes Relative: 21 % (ref 12–46)
MCH: 28.7 pg (ref 26.0–34.0)
MCHC: 33.5 g/dL (ref 30.0–36.0)
MCV: 85.7 fL (ref 78.0–100.0)
MONOS PCT: 8 % (ref 3–12)
Monocytes Absolute: 0.8 10*3/uL (ref 0.1–1.0)
NEUTROS ABS: 6.9 10*3/uL (ref 1.7–7.7)
Neutrophils Relative %: 69 % (ref 43–77)
Platelets: 317 10*3/uL (ref 150–400)
RBC: 5.19 MIL/uL (ref 4.22–5.81)
RDW: 13.5 % (ref 11.5–15.5)
WBC: 10 10*3/uL (ref 4.0–10.5)

## 2014-09-28 LAB — COMPREHENSIVE METABOLIC PANEL
ALT: 16 U/L — AB (ref 17–63)
AST: 19 U/L (ref 15–41)
Albumin: 3.4 g/dL — ABNORMAL LOW (ref 3.5–5.0)
Alkaline Phosphatase: 89 U/L (ref 38–126)
Anion gap: 9 (ref 5–15)
BILIRUBIN TOTAL: 0.6 mg/dL (ref 0.3–1.2)
BUN: 9 mg/dL (ref 6–20)
CO2: 23 mmol/L (ref 22–32)
CREATININE: 0.98 mg/dL (ref 0.61–1.24)
Calcium: 9.1 mg/dL (ref 8.9–10.3)
Chloride: 105 mmol/L (ref 101–111)
GFR calc Af Amer: >60 mL/min (ref >60)
Glucose, Bld: 137 mg/dL — ABNORMAL HIGH (ref 70–99)
Potassium: 3.9 mmol/L (ref 3.5–5.1)
Sodium: 137 mmol/L (ref 135–145)
Total Protein: 8 g/dL (ref 6.5–8.1)

## 2014-09-28 LAB — URINE MICROSCOPIC-ADD ON

## 2014-09-28 MED ORDER — HYDROMORPHONE HCL 1 MG/ML IJ SOLN
1.0000 mg | Freq: Once | INTRAMUSCULAR | Status: DC
  Filled 2014-09-28: qty 1

## 2014-09-28 MED ORDER — ONDANSETRON 4 MG PO TBDP
8.0000 mg | ORAL_TABLET | Freq: Once | ORAL | Status: AC
  Administered 2014-09-28: 8 mg via ORAL

## 2014-09-28 MED ORDER — HYDROMORPHONE HCL 1 MG/ML IJ SOLN
1.0000 mg | Freq: Once | INTRAMUSCULAR | Status: AC
Start: 2014-09-28 — End: 2014-09-28
  Administered 2014-09-28: 1 mg via INTRAVENOUS
  Filled 2014-09-28: qty 1

## 2014-09-28 MED ORDER — OXYCODONE-ACETAMINOPHEN 5-325 MG PO TABS
ORAL_TABLET | ORAL | Status: AC
  Filled 2014-09-28: qty 1

## 2014-09-28 MED ORDER — ONDANSETRON HCL 4 MG PO TABS: 4.0000 mg | ORAL_TABLET | Freq: Four times a day (QID) | ORAL | Status: DC

## 2014-09-28 MED ORDER — ONDANSETRON 4 MG PO TBDP
ORAL_TABLET | ORAL | Status: AC
  Filled 2014-09-28: qty 2

## 2014-09-28 MED ORDER — OXYCODONE-ACETAMINOPHEN 5-325 MG PO TABS
1.0000 | ORAL_TABLET | Freq: Once | ORAL | Status: AC
  Administered 2014-09-28: 1 via ORAL

## 2014-09-28 MED ORDER — OXYCODONE-ACETAMINOPHEN 5-325 MG PO TABS
1.0000 | ORAL_TABLET | Freq: Four times a day (QID) | ORAL | Status: DC | PRN
Start: 2014-09-28 — End: 2014-10-24

## 2014-09-28 MED ORDER — TAMSULOSIN HCL 0.4 MG PO CAPS: 0.4000 mg | ORAL_CAPSULE | Freq: Every day | ORAL | Status: DC

## 2014-09-28 NOTE — Discharge Instructions (Signed)
Return to the ED with any concerns including vomiting and not able to keep down liquids , fever/chills, pain not controlled by medications, decreased level of alertness/lethargy, or any other alarming symptoms °

## 2014-09-28 NOTE — ED Notes (Signed)
Patient transported to CT 

## 2014-09-28 NOTE — ED Notes (Signed)
R sided flank pain radiating to groin; hx of stones. Pain since yesterday.

## 2014-09-28 NOTE — ED Provider Notes (Signed)
CSN: [id number]     Arrival date & time 09/28/14  [id number] History   First MD Initiated Contact with Patient 09/28/14 0914     Chief Complaint  Patient presents with  . Flank Pain     (Consider location/radiation/quality/duration/timing/severity/associated sxs/prior Treatment) HPI  Pt presenting with c/o right sided flank pain which began yesterday.  He states pain is sharp, associated with nausea.  Feels similar to prior kidney stones.  Has had blood in urine and some burning with urination.  No fever/chills.  Pain is constant- has moved somewhat from upper right back to lower back.  No injury.  No treatment prior to arrival. There are no other associated systemic symptoms, there are no other alleviating or modifying factors.   Past Medical History  Diagnosis Date  . Kidney stones   . Bronchitis    Past Surgical History  Procedure Laterality Date  . Kidney stone surgery      lithortripsy  . Hand surgery     History reviewed. No pertinent family history. History  Substance Use Topics  . Smoking status: Current Every Day Smoker  . Smokeless tobacco: Not on file  . Alcohol Use: Yes    Review of Systems  ROS reviewed and all otherwise negative except for mentioned in HPI    Allergies  Review of patient's allergies indicates no known allergies.  Home Medications   Prior to Admission medications   Medication Sig Start Date End Date Taking? Authorizing Provider  ibuprofen (ADVIL,MOTRIN) 800 MG tablet Take 1 tablet (800 mg total) by mouth 3 (three) times daily. 07/05/14  Yes Courtney Forcucci, PA-C  loratadine (CLARITIN) 10 MG tablet Take 10 mg by mouth daily.   Yes Historical Provider, MD  cephALEXin (KEFLEX) 500 MG capsule Take 1 capsule (500 mg total) by mouth 3 (three) times daily. Patient not taking: Reported on 09/28/2014 07/05/14   Toni Amendourtney Forcucci, PA-C  ondansetron (ZOFRAN) 4 MG tablet Take 1 tablet (4 mg total) by mouth every 6 (six) hours. 09/28/14   Jerelyn ScottMartha Linker, MD   oxyCODONE-acetaminophen (PERCOCET/ROXICET) 5-325 MG per tablet Take 1-2 tablets by mouth every 6 (six) hours as needed for severe pain. 09/28/14   Jerelyn ScottMartha Linker, MD  tamsulosin (FLOMAX) 0.4 MG CAPS capsule Take 1 capsule (0.4 mg total) by mouth daily. 09/28/14   Jerelyn ScottMartha Linker, MD   BP 108/82 mmHg  Pulse 78  Temp(Src) 97.9 F (36.6 C) (Oral)  Resp 18  SpO2 100%  Vitals reviewed Physical Exam  Physical Examination: General appearance - alert, well appearing, and in no distress Mental status - alert, oriented to person, place, and time Eyes - no conjunctival injection, no scleral icterus Mouth - mucous membranes moist, pharynx normal without lesions Chest - clear to auscultation, no wheezes, rales or rhonchi, symmetric air entry Heart - normal rate, regular rhythm, normal S1, S2, no murmurs, rubs, clicks or gallops Abdomen - soft, nontender, nondistended, no masses or organomegaly Extremities - peripheral pulses normal, no pedal edema, no clubbing or cyanosis Skin - normal coloration and turgor, no rashes  ED Course  Procedures (including critical care time) Labs Review Labs Reviewed  COMPREHENSIVE METABOLIC PANEL - Abnormal; Notable for the following:    Glucose, Bld 137 (*)    Albumin 3.4 (*)    ALT 16 (*)    All other components within normal limits  URINALYSIS, ROUTINE W REFLEX MICROSCOPIC - Abnormal; Notable for the following:    Hgb urine dipstick LARGE (*)    All other components  within normal limits  URINE MICROSCOPIC-ADD ON - Abnormal; Notable for the following:    Bacteria, UA FEW (*)    All other components within normal limits  CBC WITH DIFFERENTIAL/PLATELET    Imaging Review Ct Abdomen Pelvis Wo Contrast  09/28/2014   CLINICAL DATA:  Right-sided flank pain radiating to inguinal region  EXAM: CT ABDOMEN AND PELVIS WITHOUT CONTRAST  TECHNIQUE: Multidetector CT imaging of the abdomen and pelvis was performed following the standard protocol without oral or intravenous  contrast material administration.  COMPARISON:  January 15, 2014  FINDINGS: Lung bases are clear.  No focal liver lesions are identified on this noncontrast enhanced study. Gallbladder wall is not thickened. There is no biliary duct dilatation.  Spleen, pancreas, and adrenals appear normal.  Left kidney shows no evidence of mass, calculus, or hydronephrosis. There is no left-sided ureteral calculus. On the right, there is no mass or intrarenal calculus. There is mild hydronephrosis on the right. There is a calculus at the right ureteropelvic junction measuring 8 x 6 mm. No other ureteral calculi are identified.  In the pelvis, the urinary bladder is midline with wall thickness within normal limits. There is no pelvic mass or pelvic fluid collection. The appendix appears normal.  There is no bowel obstruction.  No free air or portal venous air.  There is no ascites, adenopathy, or abscess in the abdomen or pelvis. Aorta appears unremarkable. There are no blastic or lytic bone lesions.  IMPRESSION: 8 x 6 mm calculus at the right ureteropelvic junction with mild hydronephrosis on the right.  Appendix appears normal.  No bowel obstruction.  No abscess.   Electronically Signed   By: Bretta BangWilliam  Woodruff III M.D.   On: 09/28/2014 10:49     EKG Interpretation None      MDM   Final diagnoses:  Right ureteral stone    Pt presenting with right sided flank pain, hx of stones- ct scan shows right UVJ stone with mild hydronephrosis.  He feels improved after pain meds.  Creatinine is reassuring.  Urine without signs of infection- culture sent.  Pt given followup information for urology.  Discharged with strict return precautions.  Pt agreeable with plan.  12:23 PM pain is now down to a 5/10- he requests another dose of pain medication and then requests discharge.    Jerelyn ScottMartha Linker, MD 09/29/14 1504

## 2014-10-24 ENCOUNTER — Emergency Department (HOSPITAL_COMMUNITY): Payer: Self-pay

## 2014-10-24 ENCOUNTER — Emergency Department (HOSPITAL_COMMUNITY)
Admission: EM | Admit: 2014-10-24 | Discharge: 2014-10-24 | Disposition: A | Discharge status: Home / Self Care | Payer: Self-pay | Attending: Emergency Medicine | Admitting: Emergency Medicine

## 2014-10-24 ENCOUNTER — Encounter (HOSPITAL_COMMUNITY): Payer: Self-pay

## 2014-10-24 DIAGNOSIS — R109 Unspecified abdominal pain: Secondary | ICD-10-CM

## 2014-10-24 DIAGNOSIS — N200 Calculus of kidney: Secondary | ICD-10-CM | POA: Insufficient documentation

## 2014-10-24 DIAGNOSIS — N508 Other specified disorders of male genital organs: Secondary | ICD-10-CM | POA: Insufficient documentation

## 2014-10-24 DIAGNOSIS — N1330 Unspecified hydronephrosis: Secondary | ICD-10-CM | POA: Insufficient documentation

## 2014-10-24 DIAGNOSIS — Z9889 Other specified postprocedural states: Secondary | ICD-10-CM | POA: Insufficient documentation

## 2014-10-24 DIAGNOSIS — Z720 Tobacco use: Secondary | ICD-10-CM | POA: Insufficient documentation

## 2014-10-24 DIAGNOSIS — R1031 Right lower quadrant pain: Secondary | ICD-10-CM

## 2014-10-24 LAB — URINALYSIS, ROUTINE W REFLEX MICROSCOPIC
Bilirubin Urine: NEGATIVE
GLUCOSE, UA: NEGATIVE mg/dL
Ketones, ur: NEGATIVE mg/dL
LEUKOCYTES UA: NEGATIVE
Nitrite: NEGATIVE
PROTEIN: NEGATIVE mg/dL
SPECIFIC GRAVITY, URINE: 1.018 (ref 1.005–1.030)
Urobilinogen, UA: 1 mg/dL (ref 0.0–1.0)
pH: 6.5 (ref 5.0–8.0)

## 2014-10-24 LAB — CBC WITH DIFFERENTIAL/PLATELET
Basophils Absolute: 0 10*3/uL (ref 0.0–0.1)
Basophils Relative: 0 % (ref 0–1)
EOS PCT: 1 % (ref 0–5)
Eosinophils Absolute: 0.1 10*3/uL (ref 0.0–0.7)
HCT: 41.4 % (ref 39.0–52.0)
HEMOGLOBIN: 14.2 g/dL (ref 13.0–17.0)
Lymphocytes Relative: 15 % (ref 12–46)
Lymphs Abs: 1.8 10*3/uL (ref 0.7–4.0)
MCH: 29.2 pg (ref 26.0–34.0)
MCHC: 34.3 g/dL (ref 30.0–36.0)
MCV: 85 fL (ref 78.0–100.0)
Monocytes Absolute: 0.8 10*3/uL (ref 0.1–1.0)
Monocytes Relative: 7 % (ref 3–12)
Neutro Abs: 9.2 10*3/uL — ABNORMAL HIGH (ref 1.7–7.7)
Neutrophils Relative %: 77 % (ref 43–77)
Platelets: 308 10*3/uL (ref 150–400)
RBC: 4.87 MIL/uL (ref 4.22–5.81)
RDW: 13.7 % (ref 11.5–15.5)
WBC: 11.9 10*3/uL — ABNORMAL HIGH (ref 4.0–10.5)

## 2014-10-24 LAB — COMPREHENSIVE METABOLIC PANEL
ALT: 18 U/L (ref 17–63)
AST: 18 U/L (ref 15–41)
Albumin: 3.4 g/dL — ABNORMAL LOW (ref 3.5–5.0)
Alkaline Phosphatase: 76 U/L (ref 38–126)
Anion gap: 8 (ref 5–15)
BILIRUBIN TOTAL: 0.7 mg/dL (ref 0.3–1.2)
BUN: 10 mg/dL (ref 6–20)
CO2: 24 mmol/L (ref 22–32)
Calcium: 9.1 mg/dL (ref 8.9–10.3)
Chloride: 104 mmol/L (ref 101–111)
Creatinine, Ser: 0.8 mg/dL (ref 0.61–1.24)
Glucose, Bld: 128 mg/dL — ABNORMAL HIGH (ref 65–99)
POTASSIUM: 4 mmol/L (ref 3.5–5.1)
SODIUM: 136 mmol/L (ref 135–145)
Total Protein: 7.7 g/dL (ref 6.5–8.1)

## 2014-10-24 LAB — URINE MICROSCOPIC-ADD ON

## 2014-10-24 MED ORDER — HYDROMORPHONE HCL 1 MG/ML IJ SOLN
1.0000 mg | Freq: Once | INTRAMUSCULAR | Status: AC
  Administered 2014-10-24: 1 mg via INTRAVENOUS
  Filled 2014-10-24: qty 1

## 2014-10-24 MED ORDER — IOHEXOL 300 MG/ML  SOLN
25.0000 mL | Freq: Once | INTRAMUSCULAR | Status: AC | PRN
  Administered 2014-10-24: 25 mL via ORAL

## 2014-10-24 MED ORDER — OXYCODONE-ACETAMINOPHEN 5-325 MG PO TABS: 1.0000 | ORAL_TABLET | Freq: Four times a day (QID) | ORAL | Status: DC | PRN

## 2014-10-24 MED ORDER — SODIUM CHLORIDE 0.9 % IV BOLUS (SEPSIS)
1000.0000 mL | Freq: Once | INTRAVENOUS | Status: AC
Start: 2014-10-24 — End: 2014-10-24
  Administered 2014-10-24: 1000 mL via INTRAVENOUS

## 2014-10-24 MED ORDER — IBUPROFEN 600 MG PO TABS: 600.0000 mg | ORAL_TABLET | Freq: Four times a day (QID) | ORAL | Status: DC | PRN

## 2014-10-24 MED ORDER — KETOROLAC TROMETHAMINE 30 MG/ML IJ SOLN
30.0000 mg | Freq: Once | INTRAMUSCULAR | Status: AC
  Administered 2014-10-24: 30 mg via INTRAVENOUS
  Filled 2014-10-24: qty 1

## 2014-10-24 MED ORDER — ONDANSETRON HCL 4 MG/2ML IJ SOLN
4.0000 mg | Freq: Once | INTRAMUSCULAR | Status: AC
  Administered 2014-10-24: 4 mg via INTRAVENOUS
  Filled 2014-10-24: qty 2

## 2014-10-24 MED ORDER — IOHEXOL 300 MG/ML  SOLN
100.0000 mL | Freq: Once | INTRAMUSCULAR | Status: AC | PRN
  Administered 2014-10-24: 100 mL via INTRAVENOUS

## 2014-10-24 MED ORDER — TAMSULOSIN HCL 0.4 MG PO CAPS: 0.4000 mg | ORAL_CAPSULE | Freq: Every day | ORAL | Status: DC

## 2014-10-24 NOTE — ED Provider Notes (Signed)
CSN: [id number]     Arrival date & time 10/24/14  1221 History   First MD Initiated Contact with Patient 10/24/14 1531     Chief Complaint  Patient presents with  . Abdominal Pain     (Consider location/radiation/quality/duration/timing/severity/associated sxs/prior Treatment) HPI  Pt is a 24yo male with hx of recurrent kidney stones, one required lithotripsy about 1 year ago, presenting to ED with c/o sudden onset, gradually worsening RLQ abdominal pain that started this morning.  Pain is sharp in nature, was waxing and waning but becoming more constant and throbbing, radiating to Right flank and Right testicle intermittently.  Denies swelling to tenderness to scrotum.  Pain is 10/10 at worst. He has not taken anything at home for pain PTA.  Reports associated nausea but denies fever, vomiting or diarrhea. Denies urinary symptoms.  Last meal was breakfast but he also drank some juice in waiting room about 3PM.  Denies recent travel or sick contacts. Abdominal surgical hx significant for lithotripsy, no other abdominal surgeries. States he still has his appendix and gallbladder.    Past Medical History  Diagnosis Date  . Kidney stones   . Bronchitis    Past Surgical History  Procedure Laterality Date  . Kidney stone surgery      lithortripsy  . Hand surgery     No family history on file. History  Substance Use Topics  . Smoking status: Current Every Day Smoker  . Smokeless tobacco: Not on file  . Alcohol Use: Yes    Review of Systems  Constitutional: Negative for fever, chills, diaphoresis, appetite change and fatigue.  Respiratory: Negative for cough and shortness of breath.   Gastrointestinal: Positive for nausea and abdominal pain (RLQ). Negative for vomiting and diarrhea.  Genitourinary: Positive for flank pain (Right) and testicular pain (Intermittent, Right side ). Negative for dysuria, urgency, frequency, hematuria, decreased urine volume, discharge, penile swelling, scrotal  swelling and penile pain.  Musculoskeletal: Positive for back pain (Right side). Negative for myalgias.  All other systems reviewed and are negative.     Allergies  Review of patient's allergies indicates no known allergies.  Home Medications   Prior to Admission medications   Medication Sig Start Date End Date Taking? Authorizing Provider  ibuprofen (ADVIL,MOTRIN) 200 MG tablet Take 200 mg by mouth every 6 (six) hours as needed for headache, mild pain or moderate pain.   Yes Historical Provider, MD  loratadine (CLARITIN) 10 MG tablet Take 10 mg by mouth daily as needed for allergies.    Yes Historical Provider, MD  cephALEXin (KEFLEX) 500 MG capsule Take 1 capsule (500 mg total) by mouth 3 (three) times daily. Patient not taking: Reported on 09/28/2014 07/05/14   Toni Amendourtney Forcucci, PA-C  ibuprofen (ADVIL,MOTRIN) 600 MG tablet Take 1 tablet (600 mg total) by mouth every 6 (six) hours as needed. 10/24/14   Junius FinnerErin O'Malley, PA-C  ibuprofen (ADVIL,MOTRIN) 800 MG tablet Take 1 tablet (800 mg total) by mouth 3 (three) times daily. Patient not taking: Reported on 10/24/2014 07/05/14   Toni Amendourtney Forcucci, PA-C  ondansetron (ZOFRAN) 4 MG tablet Take 1 tablet (4 mg total) by mouth every 6 (six) hours. Patient not taking: Reported on 10/24/2014 09/28/14   Jerelyn ScottMartha Linker, MD  oxyCODONE-acetaminophen (PERCOCET/ROXICET) 5-325 MG per tablet Take 1-2 tablets by mouth every 6 (six) hours as needed for severe pain. 10/24/14   Junius FinnerErin O'Malley, PA-C  tamsulosin (FLOMAX) 0.4 MG CAPS capsule Take 1 capsule (0.4 mg total) by mouth daily. 10/24/14  Junius Finner, PA-C   BP 101/62 mmHg  Pulse 76  Temp(Src) 98.7 F (37.1 C) (Oral)  Resp 20  SpO2 99% Physical Exam  Constitutional: He appears well-developed and well-nourished. He appears distressed.  Pt putting gown on in exam room, stopping occasionally to lean over bed, appears uncomfortable.   HENT:  Head: Normocephalic and atraumatic.  Eyes: Conjunctivae are normal. No  scleral icterus.  Neck: Normal range of motion. Neck supple.  Cardiovascular: Normal rate, regular rhythm and normal heart sounds.   Pulmonary/Chest: Effort normal and breath sounds normal. No respiratory distress. He has no wheezes. He has no rales. He exhibits no tenderness.  Abdominal: Soft. Bowel sounds are normal. He exhibits no distension and no mass. There is tenderness. There is guarding. There is no rebound.  Soft, non-distended. Tenderness to RLQ with rebound and guarding. No CVAT.   Musculoskeletal: Normal range of motion.  Neurological: He is alert.  Skin: Skin is warm and dry. He is not diaphoretic.  Nursing note and vitals reviewed.   ED Course  Procedures (including critical care time) Labs Review Labs Reviewed  CBC WITH DIFFERENTIAL/PLATELET - Abnormal; Notable for the following:    WBC 11.9 (*)    Neutro Abs 9.2 (*)    All other components within normal limits  COMPREHENSIVE METABOLIC PANEL - Abnormal; Notable for the following:    Glucose, Bld 128 (*)    Albumin 3.4 (*)    All other components within normal limits  URINALYSIS, ROUTINE W REFLEX MICROSCOPIC (NOT AT [facility]) - Abnormal; Notable for the following:    Hgb urine dipstick MODERATE (*)    All other components within normal limits  URINE MICROSCOPIC-ADD ON    Imaging Review Ct Abdomen Pelvis W Contrast  10/24/2014   CLINICAL DATA:  Right lower quadrant pain, history of prior kidney stones  EXAM: CT ABDOMEN AND PELVIS WITH CONTRAST  TECHNIQUE: Multidetector CT imaging of the abdomen and pelvis was performed using the standard protocol following bolus administration of intravenous contrast.  CONTRAST:  OMNIPAQUE IOHEXOL 300 MG/ML  SOLN  COMPARISON:  09/28/2014  FINDINGS: Lung bases are free of acute infiltrate or sizable effusion.  The liver, gallbladder, spleen, adrenal glands and pancreas are all normal in their CT appearance. The kidneys are well visualized bilaterally. The left kidney is within normal  limits. No renal calculi or obstructive changes are seen.  On the right the right kidney again demonstrates a a multi lobulated calculus measuring approximately 8-9 mm at the right ureteropelvic junction obstruction. Mild fullness of the collecting system is noted. The overall appearance of the stone and collecting system is stable. The more distal right ureter is within normal limits.  The appendix is within normal limits. The bladder is well distended and unremarkable. No pelvic mass lesion or sidewall abnormality is seen. No acute bony abnormality is noted.  IMPRESSION: Stable right ureteropelvic junction stone similar to that seen on the prior exam. Mild hydronephrosis is again seen.   Electronically Signed   By: Alcide Clever M.D.   On: 10/24/2014 17:10     EKG Interpretation None      MDM   Final diagnoses:  RLQ abdominal pain  Right flank pain  Right kidney stone  Hydronephrosis, right   Pt is a 24yo male with hx of recurrent kidney stones, presenting to ED with c/o RLQ abdominal pain that started suddenly this morning, then radiated into Right side of back and Right side of scrotum.  Pt denies  swelling or tenderness to scrotum.  On exam, pt does have RLQ abdominal tenderness with rebound and guarding. Although pt has hx of stones, concern for appendicitis with severity of focal tenderness and WBC of 11.9 CT abd: normal appendix, stable Right ureteropelvic junction stone similar to prior exam. Mild hydronephrosis.   Kidney function is good, Cr: 0.80.  No evidence of UTI Pt safe for discharge home. Will tx pain. Strongly encouraged pt to f/u with PCP at [facility] as well as [facility]. Return precautions provided. Pt verbalized understanding and agreement with tx plan.   Junius Finner, PA-C 10/24/14 1806  Rolan Bucco, MD 10/24/14 [phone]

## 2014-10-24 NOTE — ED Notes (Signed)
Pt woke up this morning having sharp pains on the right lower side of abd. Throughout the day it has gotten worse and shot around to the back. Has a hx of kidney stones but this feels a little different because he hasn't ever had the front pain before. Nausea but no vomiting.

## 2014-11-28 ENCOUNTER — Emergency Department (HOSPITAL_COMMUNITY)
Admission: EM | Admit: 2014-11-28 | Discharge: 2014-11-28 | Disposition: A | Discharge status: Home / Self Care | Payer: Self-pay | Attending: Emergency Medicine | Admitting: Emergency Medicine

## 2014-11-28 ENCOUNTER — Encounter (HOSPITAL_COMMUNITY): Payer: Self-pay

## 2014-11-28 ENCOUNTER — Emergency Department (HOSPITAL_COMMUNITY): Payer: Self-pay

## 2014-11-28 DIAGNOSIS — R109 Unspecified abdominal pain: Secondary | ICD-10-CM

## 2014-11-28 DIAGNOSIS — Z791 Long term (current) use of non-steroidal anti-inflammatories (NSAID): Secondary | ICD-10-CM | POA: Insufficient documentation

## 2014-11-28 DIAGNOSIS — Z720 Tobacco use: Secondary | ICD-10-CM | POA: Insufficient documentation

## 2014-11-28 DIAGNOSIS — N201 Calculus of ureter: Secondary | ICD-10-CM | POA: Insufficient documentation

## 2014-11-28 DIAGNOSIS — Z8709 Personal history of other diseases of the respiratory system: Secondary | ICD-10-CM | POA: Insufficient documentation

## 2014-11-28 DIAGNOSIS — Z792 Long term (current) use of antibiotics: Secondary | ICD-10-CM | POA: Insufficient documentation

## 2014-11-28 DIAGNOSIS — Z79899 Other long term (current) drug therapy: Secondary | ICD-10-CM | POA: Insufficient documentation

## 2014-11-28 LAB — URINALYSIS, ROUTINE W REFLEX MICROSCOPIC
Bilirubin Urine: NEGATIVE
Glucose, UA: NEGATIVE mg/dL
Ketones, ur: NEGATIVE mg/dL
Nitrite: NEGATIVE
Protein, ur: NEGATIVE mg/dL
Specific Gravity, Urine: 1.028 (ref 1.005–1.030)
Urobilinogen, UA: 1 mg/dL (ref 0.0–1.0)
pH: 6 (ref 5.0–8.0)

## 2014-11-28 LAB — URINE MICROSCOPIC-ADD ON

## 2014-11-28 LAB — I-STAT CHEM 8, ED
BUN: 11 mg/dL (ref 6–20)
Calcium, Ion: 1.2 mmol/L (ref 1.12–1.23)
Chloride: 103 mmol/L (ref 101–111)
Creatinine, Ser: 0.9 mg/dL (ref 0.61–1.24)
Glucose, Bld: 108 mg/dL — ABNORMAL HIGH (ref 65–99)
HCT: 44 % (ref 39.0–52.0)
Hemoglobin: 15 g/dL (ref 13.0–17.0)
Potassium: 3.8 mmol/L (ref 3.5–5.1)
Sodium: 138 mmol/L (ref 135–145)
TCO2: 24 mmol/L (ref 0–100)

## 2014-11-28 MED ORDER — OXYCODONE-ACETAMINOPHEN 5-325 MG PO TABS: 1.0000 | ORAL_TABLET | Freq: Four times a day (QID) | ORAL | Status: DC | PRN

## 2014-11-28 MED ORDER — HYDROMORPHONE HCL 1 MG/ML IJ SOLN
1.0000 mg | Freq: Once | INTRAMUSCULAR | Status: AC
  Administered 2014-11-28: 1 mg via INTRAVENOUS
  Filled 2014-11-28: qty 1

## 2014-11-28 NOTE — Discharge Instructions (Signed)
Return here as needed.  Follow-up with the urologist provided. 

## 2014-11-28 NOTE — ED Provider Notes (Signed)
CSN: [id number]     Arrival date & time 11/28/14  [id number] History   First MD Initiated Contact with Patient 11/28/14 0815     Chief Complaint  Patient presents with  . Flank Pain  . Urinary Retention     (Consider location/radiation/quality/duration/timing/severity/associated sxs/prior Treatment) HPI Patient presents to the emergency department with right flank pain has been ongoing over the last few months, but got worse over the last couple days.  Patient states that he has been seen here for kidney stones in the past.  Patient states that he feels like he has decreased urine output.  Patient states that he has not followed up with urology.  The patient denies chest pain, shortness of breath, fever, weakness, dizziness, headache, blurred vision, back pain, neck pain, nausea, vomiting, diarrhea, or syncope.  The patient states that nothing seems make his condition, better or worse Past Medical History  Diagnosis Date  . Kidney stones   . Bronchitis    Past Surgical History  Procedure Laterality Date  . Kidney stone surgery      lithortripsy  . Hand surgery     No family history on file. History  Substance Use Topics  . Smoking status: Current Every Day Smoker  . Smokeless tobacco: Not on file  . Alcohol Use: Yes     Comment: occasionally     Review of Systems  All other systems negative except as documented in the HPI. All pertinent positives and negatives as reviewed in the HPI.  Allergies  Review of patient's allergies indicates no known allergies.  Home Medications   Prior to Admission medications   Medication Sig Start Date End Date Taking? Authorizing Provider  oxyCODONE-acetaminophen (PERCOCET/ROXICET) 5-325 MG per tablet Take 1-2 tablets by mouth every 6 (six) hours as needed for severe pain. 10/24/14  Yes Junius FinnerErin O'Malley, PA-C  cephALEXin (KEFLEX) 500 MG capsule Take 1 capsule (500 mg total) by mouth 3 (three) times daily. Patient not taking: Reported on 09/28/2014  07/05/14   Toni Amendourtney Forcucci, PA-C  ibuprofen (ADVIL,MOTRIN) 600 MG tablet Take 1 tablet (600 mg total) by mouth every 6 (six) hours as needed. Patient not taking: Reported on 11/28/2014 10/24/14   Junius FinnerErin O'Malley, PA-C  ibuprofen (ADVIL,MOTRIN) 800 MG tablet Take 1 tablet (800 mg total) by mouth 3 (three) times daily. Patient not taking: Reported on 10/24/2014 07/05/14   Toni Amendourtney Forcucci, PA-C  ondansetron (ZOFRAN) 4 MG tablet Take 1 tablet (4 mg total) by mouth every 6 (six) hours. Patient not taking: Reported on 10/24/2014 09/28/14   Jerelyn ScottMartha Linker, MD  tamsulosin (FLOMAX) 0.4 MG CAPS capsule Take 1 capsule (0.4 mg total) by mouth daily. Patient not taking: Reported on 11/28/2014 10/24/14   Junius FinnerErin O'Malley, PA-C   BP 116/72 mmHg  Pulse 90  Temp(Src) 98.6 F (37 C) (Oral)  Resp 16  Ht 5\' 11"  (1.803 m)  Wt 250 lb (113.399 kg)  BMI 34.88 kg/m2  SpO2 100% Physical Exam  Constitutional: He is oriented to person, place, and time. He appears well-developed and well-nourished. No distress.  HENT:  Head: Normocephalic and atraumatic.  Mouth/Throat: Oropharynx is clear and moist. No oropharyngeal exudate.  Eyes: Pupils are equal, round, and reactive to light.  Neck: Normal range of motion. Neck supple.  Cardiovascular: Normal rate, regular rhythm and normal heart sounds.  Exam reveals no gallop and no friction rub.   No murmur heard. Pulmonary/Chest: Effort normal and breath sounds normal. No respiratory distress.  Abdominal: Soft. Normal appearance and bowel  sounds are normal. He exhibits no distension. There is no tenderness. There is CVA tenderness.  Neurological: He is alert and oriented to person, place, and time. He exhibits normal muscle tone. Coordination normal.  Skin: Skin is warm and dry. No rash noted. No erythema.  Nursing note and vitals reviewed.   ED Course  Procedures (including critical care time) Labs Review Labs Reviewed  I-STAT CHEM 8, ED - Abnormal; Notable for the following:     Glucose, Bld 108 (*)    All other components within normal limits  URINALYSIS, ROUTINE W REFLEX MICROSCOPIC (NOT AT [facility])    Imaging Review Ct Renal Stone Study  11/28/2014   CLINICAL DATA:  Right flank pain, urinary retention. Has been able to urinate since 2 a.m.  EXAM: CT ABDOMEN AND PELVIS WITHOUT CONTRAST  TECHNIQUE: Multidetector CT imaging of the abdomen and pelvis was performed following the standard protocol without IV contrast.  COMPARISON:  Head CT abdomen 10/24/2014, 09/28/2014  FINDINGS: Lower chest:  Lung bases are clear.  Normal heart size.  Hepatobiliary: Normal liver and gallbladder. No focal hepatic mass. No perihepatic fluid. No intrahepatic or extrahepatic biliary ductal dilatation.  Pancreas: Normal pancreas.  Spleen: Normal spleen.  Adrenals/Urinary Tract: Normal adrenal glands. Stable 9 mm right UPJ calculus unchanged compared with 10/24/2014. 1 mm right UVJ calculus without hydroureter. Mild right hydronephrosis. No left hydronephrosis. No left urolithiasis.  Stomach/Bowel: No bowel wall thickening or bowel dilatation. Normal appendix. No pneumatosis, pneumoperitoneum or portal venous gas. No abdominal or pelvic free fluid.  Vascular/Lymphatic: Normal caliber abdominal aorta. No abdominal or pelvic lymphadenopathy.  Reproductive: Normal prostate and seminal vesicles.  Other: No fluid collection or hematoma.  Musculoskeletal: No acute osseous abnormality. No lytic or sclerotic osseous lesion.  IMPRESSION: 1. Stable 9 mm right UPJ calculus unchanged compared with 10/24/2014, with mild right hydronephrosis 2. Punctate, 1 mm, right UVJ calculus without hydroureter.   Electronically Signed   By: Elige Ko   On: 11/28/2014 09:29    I spoke with Dr. Mena Goes about the patient and he will set up an appointment for him to follow-up in their office.  Closely.  Patient is advised that the stone has not moved over the last few visits and that this will need to be handled by the urologist.   Patient agrees the plan and all questions were answered   Charlestine Night, PA-C 11/28/14 1159  Cathren Laine, MD 11/28/14 1530

## 2014-11-28 NOTE — ED Notes (Signed)
Pt presents with c/o right flank pain and urinary retention. Pt reports a hx of kidney stones, recently diagnosed with one. Pt reports that since 2 am, he has not been able to urinate at all.

## 2014-11-28 NOTE — ED Notes (Signed)
Patient transported to CT 

## 2014-11-30 ENCOUNTER — Other Ambulatory Visit: Payer: Self-pay | Admitting: Urology

## 2014-11-30 NOTE — H&P (Signed)
Chief Complaint Consultation for nephrolithiasis   History of Present Illness 1 - Recurrent Nephrolithiasis -   Pre 2016 - SWL x1, MET x1  11/2014 - Rt UPJ 69mm stone with mod hydro. SSD 13cm, 63mm, 860HU. No additional stones. UA without infectious parameters. Cr 1.2. Failed medical therapy x few weeks. Visible at L1-2 interspace on KUB.    2 - Metabolic Stone Disease - Pt's mother Langley Gauss) and multipel maternal family members with stones.  Eval 2016: BMP,PTH,Urate - pending; Composition - pending; 83 Hr Urines - pending    PMH sig for Rt carpel tunnel surgery. NO CV disease. NO blood thinners. No PCP at present.    Today "Jacob Nixon" is seen in consultation for above, specifically to discuss furhter managmnet of his nephrolithiasis. He is referred by Irena Cords, PA with [facility].    Past Medical History Problems  1. History of No significant past medical history  Surgical History Problems  1. History of Lithotripsy 2. History of Neuroplasty Decompression Median Nerve At Carpal Tunnel  Current Meds 1. Oxycodone-Acetaminophen 5-325 MG Oral Tablet;  Therapy: (Recorded:12Jul2016) to Recorded  Allergies Medication  1. No Known Drug Allergies  Family History Problems  1. Family history of diabetes mellitus (Z83.3) : Father 2. Family history of kidney stones (Z84.1) : Mother  Social History Problems    Alcohol use (Z78.9)   Caffeine use (F15.90)   Former smoker [phone])   Occupation   Single  Review of Systems Genitourinary, constitutional, skin, eye, otolaryngeal, hematologic/lymphatic, cardiovascular, pulmonary, endocrine, musculoskeletal, gastrointestinal, neurological and psychiatric system(s) were reviewed and pertinent findings if present are noted and are otherwise negative.    Vitals Vital Signs [Data Includes: Last 1 Day]  Recorded: 12Jul2016 08:40AM  Height: 5 ft 11 in Weight: 250 lb  BMI Calculated: 34.87 BSA Calculated: 2.32 Blood Pressure:  102 / 71 Temperature: 98.3 F Heart Rate: 80  Physical Exam Constitutional: Well nourished and well developed . No acute distress.  ENT:. The ears and nose are normal in appearance.  Neck: The appearance of the neck is normal and no neck mass is present.  Pulmonary: No respiratory distress and normal respiratory rhythm and effort.  Cardiovascular: Heart rate and rhythm are normal . No peripheral edema.  Abdomen: The abdomen is soft and nontender. No masses are palpated. No CVA tenderness. No hernias are palpable. No hepatosplenomegaly noted.  Genitourinary: Examination of the penis demonstrates no discharge, no masses, no lesions and a normal meatus. The scrotum is without lesions. The right epididymis is palpably normal and non-tender. The left epididymis is palpably normal and non-tender. The right testis is non-tender and without masses. The left testis is non-tender and without masses.  Lymphatics: The femoral and inguinal nodes are not enlarged or tender.  Skin: Normal skin turgor, no visible rash and no visible skin lesions.  Neuro/Psych:. Mood and affect are appropriate.    Results/Data Urine [Data Includes: Last 1 Day]   [id number]  COLOR YELLOW   APPEARANCE CLEAR   SPECIFIC GRAVITY 1.015   pH 6.0   GLUCOSE NEG mg/dL  BILIRUBIN NEG   KETONE NEG mg/dL  BLOOD SMALL   PROTEIN NEG mg/dL  UROBILINOGEN 0.2 mg/dL  NITRITE NEG   LEUKOCYTE ESTERASE NEG   SQUAMOUS EPITHELIAL/HPF RARE   WBC NONE SEEN WBC/hpf  RBC 7-10 RBC/hpf  BACTERIA NONE SEEN   CRYSTALS NONE SEEN   CASTS NONE SEEN    Procedure   KUB:    Study: Abdominal Anterior-Posterior Radiograph  Date  of Service: 11/29/2014    Indication: Rt Ureteral Stone, Treatment Planning    Bony Structures: Visualized bony structures of the ribs, spine, and pelvis demonstrate no large lytic or blastic lesions.    Bowel Gas: Visualized bowel gas is non-obstructive appearing.     Rt Kidney: The right kidney is shadow  is visualized. A single 49m radiodense structures are identified in the expected location of the kidney and renal pelvis at UPJ to the right of L1-2 interspace. No radiodense structures are identified in the expected location of the ureter.    Lt Kidney: The left kidney is shadow is No visualized. No radiodense structures are identified in the expected location of the kidney and renal pelvis. No radiodense structures are identified in the expected location of the ureter.    Bladder Area / Pelvis: No radiodense structures are identified in the expected location of the urinary bladder. No densities consistent with phleboliths are visualized.     IMPRESSION:     1 - Rt UPJ stone, approximatlye 8100mvisible at L1-L2 interspace correspondign to known stone on recent CT.    TePhebe CollaD     Assessment Assessed  1. Nephrolithiasis (N20.0)  Plan Health Maintenance  1. UA With REFLEX; [Do Not Release]; Status:Complete;   Done: : [id number]:23AM Nephrolithiasis  2. HYPERCALCIURA PROFILE; Status:Hold For - Specimen/Data Collection,Appointment;  Requested for:12Jul2016;  3. KUB; Status:Complete;   Done: : [id number]:12AM  Discussion/Summary   1 - Recurrent Nephrolithiasis - Now with symptomatic large Rt UPJ stone, no additional. He wants SWL for this and I feel is good candidate.    We discussed management strategies of ureteral stones including medical expulsive therapy (MET) (preferred for stones <52m27miameter), ureteroscopic stone manipulation (URS), and shockwave lithotripsy (SWL) in detail including relative risks / benefits / and efficacy. We discussed that all patients are candidates for MET as long as can keep comfortable and hydrated.     After consideration of options, the patient has decided to proceed with Rt SWL next avail. Medical therapy and strainer for interval with percocet, tamsulosin.     We discussed shockwave lithotripsy in detail as well as my "rule of  9s" with stones <9mm40mess than 900 HU, and skin to stone distance <9cm having approximately 90% treatment success with single session of treatment. We then addressed how stones that are larger, more dense, and in patients with less favorable anatomy have incrementally decreased success rates. We discussed risks including, bleeding, infection, hematoma, loss of kidney, need for staged therapy, need for adjunctive therapy and requirement to refrain from any anticoagulants, anti-platelet or aspirin-like products peri-procedureally. After careful consideration, the patient has chosen to proceed.       2 - Metabolic Stone Disease - rec complete eval given recurrence at young age. serum studies today, composition and urines to follow.     3 - RTC 3-4 weeks after SWL with KUB    CC: [facility]Irena Cords with [facility]    Signatures Electronically signed by : TheoAlexis Frock; Nov 29 2014  9:30AM EST

## 2014-12-01 ENCOUNTER — Encounter (HOSPITAL_COMMUNITY): Payer: Self-pay | Admitting: *Deleted

## 2014-12-01 ENCOUNTER — Ambulatory Visit (HOSPITAL_COMMUNITY)
Admission: RE | Admit: 2014-12-01 | Discharge: 2014-12-01 | Disposition: A | Discharge status: Home / Self Care | Payer: Self-pay | Source: Ambulatory Visit | Attending: Urology | Admitting: Urology

## 2014-12-01 ENCOUNTER — Encounter (HOSPITAL_COMMUNITY)
Admission: RE | Disposition: A | Discharge status: Home / Self Care | Payer: Self-pay | Source: Ambulatory Visit | Attending: Urology

## 2014-12-01 ENCOUNTER — Ambulatory Visit (HOSPITAL_COMMUNITY): Payer: Self-pay

## 2014-12-01 DIAGNOSIS — Z6834 Body mass index (BMI) 34.0-34.9, adult: Secondary | ICD-10-CM | POA: Insufficient documentation

## 2014-12-01 DIAGNOSIS — E669 Obesity, unspecified: Secondary | ICD-10-CM | POA: Insufficient documentation

## 2014-12-01 DIAGNOSIS — Z79899 Other long term (current) drug therapy: Secondary | ICD-10-CM | POA: Insufficient documentation

## 2014-12-01 DIAGNOSIS — N200 Calculus of kidney: Secondary | ICD-10-CM | POA: Insufficient documentation

## 2014-12-01 DIAGNOSIS — Z87442 Personal history of urinary calculi: Secondary | ICD-10-CM | POA: Insufficient documentation

## 2014-12-01 DIAGNOSIS — Z79891 Long term (current) use of opiate analgesic: Secondary | ICD-10-CM | POA: Insufficient documentation

## 2014-12-01 DIAGNOSIS — N201 Calculus of ureter: Secondary | ICD-10-CM

## 2014-12-01 DIAGNOSIS — Z841 Family history of disorders of kidney and ureter: Secondary | ICD-10-CM | POA: Insufficient documentation

## 2014-12-01 DIAGNOSIS — Z87891 Personal history of nicotine dependence: Secondary | ICD-10-CM | POA: Insufficient documentation

## 2014-12-01 SURGERY — LITHOTRIPSY, ESWL
Anesthesia: LOCAL | Laterality: Right

## 2014-12-01 MED ORDER — OXYCODONE-ACETAMINOPHEN 5-325 MG PO TABS
1.0000 | ORAL_TABLET | Freq: Four times a day (QID) | ORAL | Status: DC | PRN
  Administered 2014-12-01: 2 via ORAL
  Filled 2014-12-01: qty 2

## 2014-12-01 MED ORDER — DIPHENHYDRAMINE HCL 25 MG PO CAPS
25.0000 mg | ORAL_CAPSULE | ORAL | Status: AC
  Administered 2014-12-01: 25 mg via ORAL
  Filled 2014-12-01: qty 1

## 2014-12-01 MED ORDER — CIPROFLOXACIN HCL 500 MG PO TABS
500.0000 mg | ORAL_TABLET | Freq: Once | ORAL | Status: AC
  Administered 2014-12-01: 500 mg via ORAL
  Filled 2014-12-01: qty 1

## 2014-12-01 MED ORDER — SODIUM CHLORIDE 0.9 % IV SOLN
INTRAVENOUS | Status: DC
  Administered 2014-12-01: 08:00:00 via INTRAVENOUS

## 2014-12-01 MED ORDER — DIAZEPAM 5 MG PO TABS
10.0000 mg | ORAL_TABLET | ORAL | Status: AC
  Administered 2014-12-01: 10 mg via ORAL
  Filled 2014-12-01: qty 2

## 2014-12-01 NOTE — Op Note (Signed)
Please see scanned chart for ESWL operative note. 

## 2014-12-01 NOTE — Discharge Instructions (Signed)
1. You should strain your urine and collect all fragments and bring them to your follow up appointment.  °2. You should take your pain medication as needed.  Please call if your pain is severe to the point that it is not controlled with your pain medication. °3. You should call if you develop fever > 101 or persistent nausea or vomiting. °4. Your doctor may prescribe tamsulosin to take to help facilitate stone passage. °

## 2014-12-01 NOTE — Interval H&P Note (Signed)
History and Physical Interval Note:  12/01/2014 8:46 AM  Jacob SheffieldAdrian Lanell Mcilvaine Jr.  has presented today for surgery, with the diagnosis of RIGHT URETERAL PELVIC JUNCTION STONE  The various methods of treatment have been discussed with the patient and family. After consideration of risks, benefits and other options for treatment, the patient has consented to  Procedure(s): RIGHT EXTRACORPOREAL SHOCK WAVE LITHOTRIPSY (ESWL) (Right) as a surgical intervention .  The patient's history has been reviewed, patient examined, no change in status, stable for surgery.  I have reviewed the patient's chart and labs.  Questions were answered to the patient's satisfaction.     Jacob Nixon,LES

## 2015-01-03 ENCOUNTER — Encounter (HOSPITAL_COMMUNITY): Payer: Self-pay | Admitting: *Deleted

## 2015-01-03 ENCOUNTER — Emergency Department (HOSPITAL_COMMUNITY)
Admission: EM | Admit: 2015-01-03 | Discharge: 2015-01-03 | Disposition: A | Discharge status: Home / Self Care | Payer: Self-pay | Attending: Emergency Medicine | Admitting: Emergency Medicine

## 2015-01-03 DIAGNOSIS — Y9289 Other specified places as the place of occurrence of the external cause: Secondary | ICD-10-CM | POA: Insufficient documentation

## 2015-01-03 DIAGNOSIS — Y9302 Activity, running: Secondary | ICD-10-CM | POA: Insufficient documentation

## 2015-01-03 DIAGNOSIS — Y998 Other external cause status: Secondary | ICD-10-CM | POA: Insufficient documentation

## 2015-01-03 DIAGNOSIS — R0602 Shortness of breath: Secondary | ICD-10-CM | POA: Insufficient documentation

## 2015-01-03 DIAGNOSIS — Z8709 Personal history of other diseases of the respiratory system: Secondary | ICD-10-CM | POA: Insufficient documentation

## 2015-01-03 DIAGNOSIS — S46211A Strain of muscle, fascia and tendon of other parts of biceps, right arm, initial encounter: Secondary | ICD-10-CM | POA: Insufficient documentation

## 2015-01-03 DIAGNOSIS — X58XXXA Exposure to other specified factors, initial encounter: Secondary | ICD-10-CM | POA: Insufficient documentation

## 2015-01-03 DIAGNOSIS — Z87442 Personal history of urinary calculi: Secondary | ICD-10-CM | POA: Insufficient documentation

## 2015-01-03 DIAGNOSIS — T148XXA Other injury of unspecified body region, initial encounter: Secondary | ICD-10-CM

## 2015-01-03 DIAGNOSIS — Z720 Tobacco use: Secondary | ICD-10-CM | POA: Insufficient documentation

## 2015-01-03 MED ORDER — ASPIRIN 81 MG PO CHEW
324.0000 mg | CHEWABLE_TABLET | Freq: Once | ORAL | Status: AC
  Administered 2015-01-03: 324 mg via ORAL
  Filled 2015-01-03: qty 4

## 2015-01-03 MED ORDER — NAPROXEN 500 MG PO TABS
500.0000 mg | ORAL_TABLET | Freq: Two times a day (BID) | ORAL | Status: DC
Start: 2015-01-03 — End: 2015-07-30

## 2015-01-03 NOTE — ED Provider Notes (Signed)
CSN: [id number]     Arrival date & time 01/03/15  1924 History  This chart was scribed for non-physician practitioner Antony Madura, PA-C working with Lyndal Pulley, MD by Murriel Hopper, ED Scribe. This patient was seen in room WTR5/WTR5 and the patient's care was started at 8:24 PM.    Chief Complaint  Patient presents with  . Arm Pain  . Arm Swelling    Patient is a 24 y.o. male presenting with arm pain. The history is provided by the patient. No language interpreter was used.  Arm Pain This is a new problem. The current episode started more than 2 days ago. The problem occurs constantly. The problem has been gradually worsening. Associated symptoms include shortness of breath. He has tried a warm compress for the symptoms.     HPI Comments: Jacob Nixon. is a 24 y.o. male who presents to the Emergency Department complaining of constant worsening right arm pain with associated swelling and "rash" that has been present since this morning. Pt reports it feels like "someone is putting a flamethrower to it". Pt reports three days ago he grabbed one of his dogs with his R arm while he was running and states he heard a "pop" in his arm. Pt also reports slight SOB as symptoms have worsened. Pt denies any other possible known injury. Pt denies taking any medication to treat his symptoms. Pt denies fever, syncope or near syncope, extremity numbness, or extremity weakness. Pt denies a known history of blood clots or family history of blood clots.    Past Medical History  Diagnosis Date  . Kidney stones   . Bronchitis    Past Surgical History  Procedure Laterality Date  . Kidney stone surgery      lithortripsy  . Hand surgery     No family history on file. Social History  Substance Use Topics  . Smoking status: Current Every Day Smoker  . Smokeless tobacco: None  . Alcohol Use: Yes     Comment: occasionally     Review of Systems  Constitutional: Negative for fever.  Respiratory:  Positive for shortness of breath.   Musculoskeletal: Positive for myalgias and joint swelling.  All other systems reviewed and are negative.   Allergies  Review of patient's allergies indicates no known allergies.  Home Medications   Prior to Admission medications   Medication Sig Start Date End Date Taking? Authorizing Provider  naproxen (NAPROSYN) 500 MG tablet Take 1 tablet (500 mg total) by mouth 2 (two) times daily. 01/03/15   Antony Madura, PA-C  ondansetron (ZOFRAN) 4 MG tablet Take 1 tablet (4 mg total) by mouth every 6 (six) hours. Patient not taking: Reported on 10/24/2014 09/28/14   Jerelyn Scott, MD  oxyCODONE-acetaminophen (PERCOCET/ROXICET) 5-325 MG per tablet Take 1 tablet by mouth every 6 (six) hours as needed for severe pain. 11/28/14   Charlestine Night, PA-C  tamsulosin (FLOMAX) 0.4 MG CAPS capsule Take 1 capsule (0.4 mg total) by mouth daily. Patient not taking: Reported on 11/28/2014 10/24/14   Junius Finner, PA-C   BP 131/79 mmHg  Pulse 113  Temp(Src) 98.8 F (37.1 C) (Oral)  Resp 18  SpO2 99%   Physical Exam  Constitutional: He is oriented to person, place, and time. He appears well-developed and well-nourished. No distress.  Nontoxic/nonseptic appearing.  HENT:  Head: Normocephalic and atraumatic.  Eyes: Conjunctivae and EOM are normal. No scleral icterus.  Neck: Normal range of motion.  No JVD  Cardiovascular: Regular rhythm  and intact distal pulses.   Distal radial pulse 2+ in the RUE. Capillary refill brisk in all digits.  Pulmonary/Chest: Effort normal. No respiratory distress. He has no wheezes. He has no rales.  Lungs CTAB. Chest expansion symmetric. No tachypnea or dyspnea.  Musculoskeletal: Normal range of motion. He exhibits tenderness.       Right shoulder: Normal.       Right elbow: Normal.      Right upper arm: He exhibits tenderness and swelling. He exhibits no bony tenderness, no deformity and no laceration.       Arms: TTP to biceps tendon of  RUE with mild associated swelling. Biceps tendon strength is 5/5 against resistance.  Neurological: He is alert and oriented to person, place, and time. He exhibits normal muscle tone. Coordination normal.  Sensation to light touch intact in b/l upper extremities.  Skin: Skin is warm and dry. No rash noted. He is not diaphoretic. There is erythema. No pallor.  Nonblanching ecchymosis and mild erythema c/w hematoma.  Psychiatric: He has a normal mood and affect. His behavior is normal.  Nursing note and vitals reviewed.   ED Course  Procedures (including critical care time)  DIAGNOSTIC STUDIES: Oxygen Saturation is 99% on room air, normal by my interpretation.    COORDINATION OF CARE: 8:30 PM Discussed treatment plan with pt at bedside and pt agreed to plan.   Labs Review Labs Reviewed - No data to display  Imaging Review No results found.    EKG Interpretation None      MDM   Final diagnoses:  Strain of biceps tendon, right, initial encounter  Hematoma    24 y/o male presents for RUE pain and swelling with hematoma present on exam. Symptoms began after patient pulled his dog with his R arm, hearing a "pop" shortly after. Patient is neurovascularly intact on exam. Biceps tendon is intact with normal strength against resistance, though it is palpably tender. Symptoms c/w strain to biceps tendon. Patient also c/o shortness of breath. He has no tachypnea, dyspnea, or hypoxia today. His lungs are CTAB. Low suspicion for upper extremity DVT, but will obtain outpatient UE venous duplex to rule out posttraumatic DVT in the R arm. NSAIDs, ice, compression, and rest advised. Return precautions discussed and provided. Patient agreeable to plan with no unaddressed concerns. Patient discharged in good condition.  I personally performed the services described in this documentation, which was scribed in my presence. The recorded information has been reviewed and is accurate.     Antony Madura, PA-C 01/03/15 2059  Lyndal Pulley, MD 01/04/15 1500

## 2015-01-03 NOTE — Discharge Instructions (Signed)
Keep your arm wrapped with an ACE bandage and apply ice 3-4 times per day for 15-20 minutes each time. Have an ultrasound of your right arm completed to evaluate for a post-traumatic blood clot in your right arm which may be worsening your swelling. Return to the ED sooner if symptoms worsen.  Muscle Strain A muscle strain is an injury that occurs when a muscle is stretched beyond its normal length. Usually a small number of muscle fibers are torn when this happens. Muscle strain is rated in degrees. First-degree strains have the least amount of muscle fiber tearing and pain. Second-degree and third-degree strains have increasingly more tearing and pain.  Usually, recovery from muscle strain takes 1-2 weeks. Complete healing takes 5-6 weeks.  CAUSES  Muscle strain happens when a sudden, violent force placed on a muscle stretches it too far. This may occur with lifting, sports, or a fall.  RISK FACTORS Muscle strain is especially common in athletes.  SIGNS AND SYMPTOMS At the site of the muscle strain, there may be:  Pain.  Bruising.  Swelling.  Difficulty using the muscle due to pain or lack of normal function. DIAGNOSIS  Your health care provider will perform a physical exam and ask about your medical history. TREATMENT  Often, the best treatment for a muscle strain is resting, icing, and applying cold compresses to the injured area.  HOME CARE INSTRUCTIONS   Use the PRICE method of treatment to promote muscle healing during the first 2-3 days after your injury. The PRICE method involves:  Protecting the muscle from being injured again.  Restricting your activity and resting the injured body part.  Icing your injury. To do this, put ice in a plastic bag. Place a towel between your skin and the bag. Then, apply the ice and leave it on from 15-20 minutes each hour. After the third day, switch to moist heat packs.  Apply compression to the injured area with a splint or elastic  bandage. Be careful not to wrap it too tightly. This may interfere with blood circulation or increase swelling.  Elevate the injured body part above the level of your heart as often as you can.  Only take over-the-counter or prescription medicines for pain, discomfort, or fever as directed by your health care provider.  Warming up prior to exercise helps to prevent future muscle strains. SEEK MEDICAL CARE IF:   You have increasing pain or swelling in the injured area.  You have numbness, tingling, or a significant loss of strength in the injured area. MAKE SURE YOU:   Understand these instructions.  Will watch your condition.  Will get help right away if you are not doing well or get worse. Document Released: 05/06/2005 Document Revised: 02/24/2013 Document Reviewed: 12/03/2012 [facility] Patient Information 2015 [place], [place]. This information is not intended to replace advice given to you by your health care provider. Make sure you discuss any questions you have with your health care provider.  Hematoma A hematoma is a collection of blood. The collection of blood can turn into a hard, painful lump under the skin. Your skin may turn blue or yellow if the hematoma is close to the surface of the skin. Most hematomas get better in a few days to weeks. Some hematomas are serious and need medical care. Hematomas can be very small or very big. HOME CARE  Apply ice to the injured area:  Put ice in a plastic bag.  Place a towel between your skin and the  bag.  Leave the ice on for 20 minutes, 2-3 times a day for the first 1 to 2 days.  After the first 2 days, switch to using warm packs on the injured area.  Raise (elevate) the injured area to lessen pain and puffiness (swelling). You may also wrap the area with an elastic bandage. Make sure the bandage is not wrapped too tight.  If you have a painful hematoma on your leg or foot, you may use crutches for a couple days.  Only take  medicines as told by your doctor. GET HELP RIGHT AWAY IF:   Your pain gets worse.  Your pain is not controlled with medicine.  You have a fever.  Your puffiness gets worse.  Your skin turns more blue or yellow.  Your skin over the hematoma breaks or starts bleeding.  Your hematoma is in your chest or belly (abdomen) and you are short of breath, feel weak, or have a change in consciousness.  Your hematoma is on your scalp and you have a headache that gets worse or a change in alertness or consciousness. MAKE SURE YOU:   Understand these instructions.  Will watch your condition.  Will get help right away if you are not doing well or get worse. Document Released: 06/13/2004 Document Revised: 01/06/2013 Document Reviewed: 10/14/2012 [facility] Patient Information 2015 [place], [place]. This information is not intended to replace advice given to you by your health care provider. Make sure you discuss any questions you have with your health care provider.

## 2015-01-03 NOTE — ED Notes (Addendum)
Pt complains of left arm redness and swelling since last night. Pt states he noticed a red mark last night, which then spread up his arm and shoulder. Pt denies injury. Pt describes the pain as burning, 6/10.

## 2015-07-29 ENCOUNTER — Inpatient Hospital Stay (HOSPITAL_COMMUNITY)
Admission: EM | Admit: 2015-07-29 | Discharge: 2015-08-01 | DRG: 603 | Disposition: A | Discharge status: Home / Self Care | Payer: BLUE CROSS/BLUE SHIELD | Attending: Internal Medicine | Admitting: Internal Medicine

## 2015-07-29 DIAGNOSIS — G5601 Carpal tunnel syndrome, right upper limb: Secondary | ICD-10-CM | POA: Diagnosis present

## 2015-07-29 DIAGNOSIS — Z23 Encounter for immunization: Secondary | ICD-10-CM

## 2015-07-29 DIAGNOSIS — L03119 Cellulitis of unspecified part of limb: Secondary | ICD-10-CM

## 2015-07-29 DIAGNOSIS — L0390 Cellulitis, unspecified: Secondary | ICD-10-CM

## 2015-07-29 DIAGNOSIS — Z87442 Personal history of urinary calculi: Secondary | ICD-10-CM

## 2015-07-29 DIAGNOSIS — L03113 Cellulitis of right upper limb: Secondary | ICD-10-CM | POA: Diagnosis not present

## 2015-07-29 DIAGNOSIS — Z87891 Personal history of nicotine dependence: Secondary | ICD-10-CM

## 2015-07-29 DIAGNOSIS — M659 Synovitis and tenosynovitis, unspecified: Secondary | ICD-10-CM | POA: Diagnosis present

## 2015-07-29 DIAGNOSIS — M25531 Pain in right wrist: Secondary | ICD-10-CM | POA: Diagnosis not present

## 2015-07-30 ENCOUNTER — Encounter (HOSPITAL_COMMUNITY): Payer: Self-pay | Admitting: Emergency Medicine

## 2015-07-30 ENCOUNTER — Inpatient Hospital Stay (HOSPITAL_COMMUNITY): Payer: BLUE CROSS/BLUE SHIELD

## 2015-07-30 DIAGNOSIS — Z87442 Personal history of urinary calculi: Secondary | ICD-10-CM | POA: Diagnosis not present

## 2015-07-30 DIAGNOSIS — M25531 Pain in right wrist: Secondary | ICD-10-CM | POA: Diagnosis present

## 2015-07-30 DIAGNOSIS — M659 Synovitis and tenosynovitis, unspecified: Secondary | ICD-10-CM | POA: Diagnosis present

## 2015-07-30 DIAGNOSIS — Z23 Encounter for immunization: Secondary | ICD-10-CM | POA: Diagnosis not present

## 2015-07-30 DIAGNOSIS — G5601 Carpal tunnel syndrome, right upper limb: Secondary | ICD-10-CM | POA: Diagnosis present

## 2015-07-30 DIAGNOSIS — L03113 Cellulitis of right upper limb: Secondary | ICD-10-CM | POA: Diagnosis present

## 2015-07-30 DIAGNOSIS — Z87891 Personal history of nicotine dependence: Secondary | ICD-10-CM | POA: Diagnosis not present

## 2015-07-30 HISTORY — DX: Cellulitis of right upper limb: L03.113

## 2015-07-30 LAB — CBC WITH DIFFERENTIAL/PLATELET
Basophils Absolute: 0 10*3/uL (ref 0.0–0.1)
Basophils Relative: 0 %
EOS PCT: 1 %
Eosinophils Absolute: 0.2 10*3/uL (ref 0.0–0.7)
HCT: 42.8 % (ref 39.0–52.0)
HEMOGLOBIN: 14.6 g/dL (ref 13.0–17.0)
LYMPHS ABS: 3.2 10*3/uL (ref 0.7–4.0)
Lymphocytes Relative: 23 %
MCH: 29.6 pg (ref 26.0–34.0)
MCHC: 34.1 g/dL (ref 30.0–36.0)
MCV: 86.8 fL (ref 78.0–100.0)
MONOS PCT: 11 %
Monocytes Absolute: 1.6 10*3/uL — ABNORMAL HIGH (ref 0.1–1.0)
NEUTROS ABS: 8.9 10*3/uL — AB (ref 1.7–7.7)
NEUTROS PCT: 65 %
PLATELETS: 349 10*3/uL (ref 150–400)
RBC: 4.93 MIL/uL (ref 4.22–5.81)
RDW: 13.8 % (ref 11.5–15.5)
WBC: 13.9 10*3/uL — AB (ref 4.0–10.5)

## 2015-07-30 LAB — URIC ACID: Uric Acid, Serum: 6.1 mg/dL (ref 4.4–7.6)

## 2015-07-30 LAB — BASIC METABOLIC PANEL
Anion gap: 10 (ref 5–15)
BUN: 13 mg/dL (ref 6–20)
CHLORIDE: 103 mmol/L (ref 101–111)
CO2: 23 mmol/L (ref 22–32)
Calcium: 9 mg/dL (ref 8.9–10.3)
Creatinine, Ser: 0.82 mg/dL (ref 0.61–1.24)
GFR calc Af Amer: >60 mL/min (ref >60)
GFR calc non Af Amer: >60 mL/min (ref >60)
GLUCOSE: 95 mg/dL (ref 65–99)
Potassium: 3.8 mmol/L (ref 3.5–5.1)
Sodium: 136 mmol/L (ref 135–145)

## 2015-07-30 LAB — SEDIMENTATION RATE: Sed Rate: 35 mm/hr — ABNORMAL HIGH (ref 0–16)

## 2015-07-30 LAB — C-REACTIVE PROTEIN: CRP: 9.1 mg/dL — ABNORMAL HIGH (ref <1.0)

## 2015-07-30 MED ORDER — GADOBENATE DIMEGLUMINE 529 MG/ML IV SOLN
20.0000 mL | Freq: Once | INTRAVENOUS | Status: AC | PRN
  Administered 2015-07-30: 20 mL via INTRAVENOUS

## 2015-07-30 MED ORDER — FENTANYL CITRATE (PF) 100 MCG/2ML IJ SOLN
50.0000 ug | Freq: Once | INTRAMUSCULAR | Status: AC
  Administered 2015-07-30: 50 ug via INTRAVENOUS
  Filled 2015-07-30: qty 2

## 2015-07-30 MED ORDER — HYDROMORPHONE HCL 1 MG/ML IJ SOLN
0.5000 mg | INTRAMUSCULAR | Status: DC | PRN
  Administered 2015-07-30 – 2015-08-01 (×8): 1 mg via INTRAVENOUS
  Filled 2015-07-30 (×8): qty 1

## 2015-07-30 MED ORDER — HEPARIN SODIUM (PORCINE) 5000 UNIT/ML IJ SOLN
5000.0000 [IU] | Freq: Three times a day (TID) | INTRAMUSCULAR | Status: DC
  Administered 2015-07-30 – 2015-08-01 (×6): 5000 [IU] via SUBCUTANEOUS
  Filled 2015-07-30 (×12): qty 1

## 2015-07-30 MED ORDER — HYDROMORPHONE HCL 1 MG/ML IJ SOLN
0.5000 mg | INTRAMUSCULAR | Status: DC | PRN
  Administered 2015-07-30: 1 mg via INTRAVENOUS
  Filled 2015-07-30: qty 1

## 2015-07-30 MED ORDER — VANCOMYCIN HCL IN DEXTROSE 1-5 GM/200ML-% IV SOLN
1000.0000 mg | Freq: Once | INTRAVENOUS | Status: AC
  Administered 2015-07-30: 1000 mg via INTRAVENOUS
  Filled 2015-07-30: qty 200

## 2015-07-30 MED ORDER — IBUPROFEN 600 MG PO TABS
600.0000 mg | ORAL_TABLET | Freq: Three times a day (TID) | ORAL | Status: DC
  Administered 2015-07-30 – 2015-08-01 (×6): 600 mg via ORAL
  Filled 2015-07-30 (×8): qty 1
  Filled 2015-07-30 (×2): qty 3

## 2015-07-30 MED ORDER — VANCOMYCIN HCL IN DEXTROSE 1-5 GM/200ML-% IV SOLN
1000.0000 mg | Freq: Three times a day (TID) | INTRAVENOUS | Status: DC
  Administered 2015-07-30 – 2015-08-01 (×6): 1000 mg via INTRAVENOUS
  Filled 2015-07-30 (×9): qty 200

## 2015-07-30 MED ORDER — PIPERACILLIN-TAZOBACTAM 3.375 G IVPB
3.3750 g | Freq: Once | INTRAVENOUS | Status: AC
  Administered 2015-07-30: 3.375 g via INTRAVENOUS
  Filled 2015-07-30: qty 50

## 2015-07-30 MED ORDER — HYDROCODONE-ACETAMINOPHEN 5-325 MG PO TABS
1.0000 | ORAL_TABLET | ORAL | Status: DC | PRN
  Administered 2015-07-31: 2 via ORAL
  Filled 2015-07-30: qty 2

## 2015-07-30 MED ORDER — KETOROLAC TROMETHAMINE 30 MG/ML IJ SOLN
30.0000 mg | Freq: Once | INTRAMUSCULAR | Status: AC
  Administered 2015-07-30: 30 mg via INTRAVENOUS
  Filled 2015-07-30: qty 1

## 2015-07-30 MED ORDER — INFLUENZA VAC SPLIT QUAD 0.5 ML IM SUSY
0.5000 mL | PREFILLED_SYRINGE | INTRAMUSCULAR | Status: AC
  Administered 2015-08-01: 0.5 mL via INTRAMUSCULAR
  Filled 2015-07-30 (×2): qty 0.5

## 2015-07-30 NOTE — ED Notes (Addendum)
Pt had carpel tunnel surgery 2 years ago. Pt states he has had palm swelling ever since then. Pt also has never regained feeling in his fingers. Pt states that he has an MRI and a nerve study (pt occasionally has right sided neck pain and there could be nerve damage from his surgery) scheduled and he has seen a few ortho specialists. Today he began experiencing warmth and swelling and redness in his left forearm. Pt is able to move his fingers and has a palpable radial pulse. Pt rates his pain a 7/10 in both his palm and forearm. He describes it as burning and sharp. Pt states at home he has had a low grade fever and had had chills for a few days

## 2015-07-30 NOTE — Progress Notes (Addendum)
PROGRESS NOTE    Jacob Nixon.  YNW:[id number]  DOB: 11-21-1990  DOA: 07/29/2015 PCP: No PCP Per Patient Outpatient Specialists: Orthopedics: Dr. Corrie Mckusick [facility] course: 25 year old male with history of kidney stones, s/p right carpal tunnel surgery ~2 years ago with residual chronic right thenar eminence pain, right distal forearm swelling, persistent numbness of right index and middle finger, right-handed, works Optometrist, presented to ED with complaints of worsening swelling, pain, erythema from mid forearm distally to her hand. No reported puncture wounds. Admitted for presumed cellulitis. MRI right wrist confirms cellulitis. Requested hand surgeon to evaluate.   Assessment & Plan:   Cellulitis of right forearm and wrist - Unclear precipitating event. - MRI of right wrist confirms cellulitis without abscess. No septic arthritis or osteomyelitis. - Continue IV vancomycin and elevate right upper extremity above the level of the heart. - No features of compartment syndrome. Monitor closely. - Hand surgeon consulted to evaluate. NPO until evaluated by surgeon.  S/P right carpal tunnel surgery - Patient has residual and chronic swelling, pain and sensitivity abnormalities.  DVT prophylaxis: Subcutaneous heparin Code Status: Full Family Communication: Spouse sleeping in room & hence could not update. Disposition Plan: DC home when medically stable, possibly in 48-72 hours.   Consultants:  Hand surgeon  Procedures:  None  Antimicrobials:  IV Vancomycin 3/12>   Subjective: Slight improvement in redness, swelling and pain.  Objective: Filed Vitals:   07/30/15 0633 07/30/15 0636 07/30/15 0700 07/30/15 0900  BP:   111/64   Pulse: 93  70   Temp:  98.2 F (36.8 C) 98.6 F (37 C)   TempSrc:  Oral Oral   Resp:      Height:     (1.803 m)  Weight:    113.399 kg (250 lb)  SpO2: 98%  98%     Intake/Output Summary (Last 24 hours) at  07/30/15 1341 Last data filed at 07/30/15 0900  Gross per 24 hour  Intake      0 ml  Output      0 ml  Net      0 ml   Filed Weights   07/30/15 0900  Weight: 113.399 kg (250 lb)    Exam:  General exam: Pleasant young male lying comfortably supine in bed. Respiratory system: Clear. No increased work of breathing. Cardiovascular system: S1 & S2 heard, RRR. No JVD, murmurs, gallops, clicks or pedal edema. Gastrointestinal system: Abdomen is nondistended, soft and nontender. Normal bowel sounds heard. Central nervous system: Alert and oriented. No focal neurological deficits. Extremities: Symmetric 5 x 5 power. Swelling, minimal increased warmth, faint patchy erythema, mild tenderness from mid right forearm extending up to wrist. No fluctuance or crepitus. Radial pulses well felt. Finger movements are unremarkable.   Data Reviewed: Basic Metabolic Panel:  Recent Labs Lab 07/30/15 0445  NA 136  K 3.8  CL 103  CO2 23  GLUCOSE 95  BUN 13  CREATININE 0.82  CALCIUM 9.0   Liver Function Tests: No results for input(s): AST, ALT, ALKPHOS, BILITOT, PROT, ALBUMIN in the last 168 hours. No results for input(s): LIPASE, AMYLASE in the last 168 hours. No results for input(s): AMMONIA in the last 168 hours. CBC:  Recent Labs Lab 07/30/15 0445  WBC 13.9*  NEUTROABS 8.9*  HGB 14.6  HCT 42.8  MCV 86.8  PLT 349   Cardiac Enzymes: No results for input(s): CKTOTAL, CKMB, CKMBINDEX, TROPONINI in the last 168 hours. BNP (last 3  results) No results for input(s): PROBNP in the last 8760 hours. CBG: No results for input(s): GLUCAP in the last 168 hours.  Recent Results (from the past 240 hour(s))  Culture, blood (routine x 2)     Status: None (Preliminary result)   Collection Time: 07/30/15  4:45 AM  Result Value Ref Range Status   Specimen Description BLOOD LEFT ANTECUBITAL  Final   Special Requests   Final    BOTTLES DRAWN AEROBIC AND ANAEROBIC 8CC Performed at [facility]    Culture PENDING  Incomplete   Report Status PENDING  Incomplete  Culture, blood (routine x 2)     Status: None (Preliminary result)   Collection Time: 07/30/15  4:45 AM  Result Value Ref Range Status   Specimen Description BLOOD LEFT HAND  Final   Special Requests   Final    BOTTLES DRAWN AEROBIC AND ANAEROBIC 8CC Performed at [facility]    Culture PENDING  Incomplete   Report Status PENDING  Incomplete         Studies: Mr Wrist Right W Wo Contrast  07/30/2015  CLINICAL DATA:  Right arm and wrist pain and swelling for 2 years, worsening over the last day. Evaluate for cellulitis versus septic joint. EXAM: MR OF THE RIGHT WRIST WITHOUT AND WITH CONTRAST TECHNIQUE: Multiplanar multisequence MR imaging of the right wrist was performed both before and after the administration of intravenous contrast. CONTRAST:  20mL MULTIHANCE GADOBENATE DIMEGLUMINE 529 MG/ML IV SOLN COMPARISON:  Thumb radiographs 05/29/2013.  Wrist MRI 09/30/2012. FINDINGS: Ligaments: The scapholunate and lunotriquetral ligaments appear intact. Triangular fibrocartilage: The triangular fibrocartilage appears degenerated but intact. The ulnar variance is neutral. Tendons: There is severe diffuse extensor tenosynovitis involving dorsal compartments 1 through 4. There is associated florid synovial enhancement in these extensor sheaths. Only a small amount of nonenhancing fluid is present, greatest in the extensor digitorum tendon sheath. No significant flexor tendon abnormalities. Carpal tunnel/median nerve: Unremarkable. Guyon's canal: Unremarkable. Joint/cartilage: There is no significant fluid within any of the wrist joint compartments. Relative to the extensor tendons, there is relatively mild synovial enhancement which is greatest in the distal radioulnar joint. There are stable intraosseous cysts within the lunate and capitate. No erosive changes, subchondral edema or cortical destruction to suggest  osteomyelitis. Bones/carpal alignment: The carpal bone alignment is stable.No erosive changes, subchondral edema or cortical destruction to suggest osteomyelitis. Other: There is diffuse soft tissue edema and enhancement within the subcutaneous tissues, greatest dorsally and radially. No focal fluid collections are seen. Compared with the prior examination, the subcutaneous findings are new. The tenosynovitis has worsened overall, although previously demonstrated involvement in the ECU tendon sheath and carpal tunnel has resolved. IMPRESSION: 1. Diffuse subcutaneous edema and enhancement throughout the dorsal and radial aspect of the wrist consistent with cellulitis. 2. Extensive extensor tenosynovitis, progressive compared with prior study from 2014. Previously, there was greater involvement in the ECU tendon sheath and carpal tunnel which has resolved. The chronic/recurrent nature and relative lack of fluid in the tendon sheaths argues against infection, and chronic inflammatory arthropathy favored. Correlate clinically. 3. Relatively mild synovitis without significant joint fluid to suggest infection. No evidence of osteomyelitis. Electronically Signed   By: Carey Bullocks M.D.   On: 07/30/2015 12:18        Scheduled Meds: . heparin  5,000 Units Subcutaneous 3 times per day  . [START ON 07/31/2015] Influenza vac split quadrivalent PF  0.5 mL Intramuscular Tomorrow-1000  . vancomycin  1,000  mg Intravenous Q8H   Continuous Infusions:   Active Problems:   Cellulitis of forearm, right    Time spent: 35 minutes.    Marcellus ScottHONGALGI,Yenni Carra, MD, FACP, FHM. [facility] Pager [phone]  If 7PM-7AM, please contact night-coverage [email or web address] Password [email or web address] 07/30/2015, 1:41 PM    LOS: 0 days

## 2015-07-30 NOTE — Consult Note (Signed)
Reason for Consult:right wrist swelling and pain times 2 days Referring Physician: Lawanda Cousins. is an 25 y.o. male.  HPI: with h/o of persistent right CTS despite release around 2 years ago with recent onset of acute right wrist swelling and pain  Past Medical History  Diagnosis Date  . Kidney stones   . Bronchitis     Past Surgical History  Procedure Laterality Date  . Kidney stone surgery      lithortripsy  . Hand surgery      No family history on file.  Social History:  reports that he has quit smoking. He does not have any smokeless tobacco history on file. He reports that he drinks alcohol. He reports that he does not use illicit drugs.  Allergies: No Known Allergies  Medications:  Scheduled: . heparin  5,000 Units Subcutaneous 3 times per day  . [START ON 07/31/2015] Influenza vac split quadrivalent PF  0.5 mL Intramuscular Tomorrow-1000  . vancomycin  1,000 mg Intravenous Q8H    Results for orders placed or performed during the hospital encounter of 07/29/15 (from the past 48 hour(s))  CBC with Differential     Status: Abnormal   Collection Time: 07/30/15  4:45 AM  Result Value Ref Range   WBC 13.9 (H) 4.0 - 10.5 K/uL   RBC 4.93 4.22 - 5.81 MIL/uL   Hemoglobin 14.6 13.0 - 17.0 g/dL   HCT 42.8 39.0 - 52.0 %   MCV 86.8 78.0 - 100.0 fL   MCH 29.6 26.0 - 34.0 pg   MCHC 34.1 30.0 - 36.0 g/dL   RDW 13.8 11.5 - 15.5 %   Platelets 349 150 - 400 K/uL   Neutrophils Relative % 65 %   Neutro Abs 8.9 (H) 1.7 - 7.7 K/uL   Lymphocytes Relative 23 %   Lymphs Abs 3.2 0.7 - 4.0 K/uL   Monocytes Relative 11 %   Monocytes Absolute 1.6 (H) 0.1 - 1.0 K/uL   Eosinophils Relative 1 %   Eosinophils Absolute 0.2 0.0 - 0.7 K/uL   Basophils Relative 0 %   Basophils Absolute 0.0 0.0 - 0.1 K/uL  Basic metabolic panel     Status: None   Collection Time: 07/30/15  4:45 AM  Result Value Ref Range   Sodium 136 135 - 145 mmol/L   Potassium 3.8 3.5 - 5.1 mmol/L    Chloride 103 101 - 111 mmol/L   CO2 23 22 - 32 mmol/L   Glucose, Bld 95 65 - 99 mg/dL   BUN 13 6 - 20 mg/dL   Creatinine, Ser 0.82 0.61 - 1.24 mg/dL   Calcium 9.0 8.9 - 10.3 mg/dL   GFR calc non Af Amer >60 >60 mL/min   GFR calc Af Amer >60 >60 mL/min    Comment: (NOTE) The eGFR has been calculated using the CKD EPI equation. This calculation has not been validated in all clinical situations. eGFR's persistently <60 mL/min signify possible Chronic Kidney Disease.    Anion gap 10 5 - 15  Sedimentation rate     Status: Abnormal   Collection Time: 07/30/15  4:45 AM  Result Value Ref Range   Sed Rate 35 (H) 0 - 16 mm/hr  C-reactive protein     Status: Abnormal   Collection Time: 07/30/15  4:45 AM  Result Value Ref Range   CRP 9.1 (H) <1.0 mg/dL    Comment: Performed at [facility]  Culture, blood (routine x 2)  Status: None (Preliminary result)   Collection Time: 07/30/15  4:45 AM  Result Value Ref Range   Specimen Description BLOOD LEFT ANTECUBITAL    Special Requests      BOTTLES DRAWN AEROBIC AND ANAEROBIC 8CC Performed at [facility]    Culture PENDING    Report Status PENDING   Culture, blood (routine x 2)     Status: None (Preliminary result)   Collection Time: 07/30/15  4:45 AM  Result Value Ref Range   Specimen Description BLOOD LEFT HAND    Special Requests      BOTTLES DRAWN AEROBIC AND ANAEROBIC 8CC Performed at [facility]    Culture PENDING    Report Status PENDING   Uric acid     Status: None   Collection Time: 07/30/15  4:54 AM  Result Value Ref Range   Uric Acid, Serum 6.1 4.4 - 7.6 mg/dL    Mr Wrist Right W Wo Contrast  07/30/2015  CLINICAL DATA:  Right arm and wrist pain and swelling for 2 years, worsening over the last day. Evaluate for cellulitis versus septic joint. EXAM: MR OF THE RIGHT WRIST WITHOUT AND WITH CONTRAST TECHNIQUE: Multiplanar multisequence MR imaging of the right wrist was performed both before and after  the administration of intravenous contrast. CONTRAST:  9m MULTIHANCE GADOBENATE DIMEGLUMINE 529 MG/ML IV SOLN COMPARISON:  Thumb radiographs 05/29/2013.  Wrist MRI 09/30/2012. FINDINGS: Ligaments: The scapholunate and lunotriquetral ligaments appear intact. Triangular fibrocartilage: The triangular fibrocartilage appears degenerated but intact. The ulnar variance is neutral. Tendons: There is severe diffuse extensor tenosynovitis involving dorsal compartments 1 through 4. There is associated florid synovial enhancement in these extensor sheaths. Only a small amount of nonenhancing fluid is present, greatest in the extensor digitorum tendon sheath. No significant flexor tendon abnormalities. Carpal tunnel/median nerve: Unremarkable. Guyon's canal: Unremarkable. Joint/cartilage: There is no significant fluid within any of the wrist joint compartments. Relative to the extensor tendons, there is relatively mild synovial enhancement which is greatest in the distal radioulnar joint. There are stable intraosseous cysts within the lunate and capitate. No erosive changes, subchondral edema or cortical destruction to suggest osteomyelitis. Bones/carpal alignment: The carpal bone alignment is stable.No erosive changes, subchondral edema or cortical destruction to suggest osteomyelitis. Other: There is diffuse soft tissue edema and enhancement within the subcutaneous tissues, greatest dorsally and radially. No focal fluid collections are seen. Compared with the prior examination, the subcutaneous findings are new. The tenosynovitis has worsened overall, although previously demonstrated involvement in the ECU tendon sheath and carpal tunnel has resolved. IMPRESSION: 1. Diffuse subcutaneous edema and enhancement throughout the dorsal and radial aspect of the wrist consistent with cellulitis. 2. Extensive extensor tenosynovitis, progressive compared with prior study from 2014. Previously, there was greater involvement in the ECU  tendon sheath and carpal tunnel which has resolved. The chronic/recurrent nature and relative lack of fluid in the tendon sheaths argues against infection, and chronic inflammatory arthropathy favored. Correlate clinically. 3. Relatively mild synovitis without significant joint fluid to suggest infection. No evidence of osteomyelitis. Electronically Signed   By: WRichardean SaleM.D.   On: 07/30/2015 12:18    Review of Systems  Neurological: Positive for tingling.  All other systems reviewed and are negative.  Blood pressure 111/64, pulse 70, temperature 98.6 F (37 C), temperature source Oral, resp. rate 18, height '5\' 11"'  (1.803 m), weight 113.399 kg (250 lb), SpO2 98 %. Physical Exam  Constitutional: He is oriented to person, place, and time. He appears  well-developed and well-nourished.  HENT:  Head: Normocephalic and atraumatic.  Neck: Normal range of motion.  Cardiovascular: Normal rate.   Respiratory: Effort normal.  Musculoskeletal:       Right wrist: He exhibits tenderness and swelling.  Right wrist dorsal tenosynovitis involving 1-4 compartments. Positive finklesteins test and signs of possible intersection syndrome    Neurological: He is alert and oriented to person, place, and time.  Skin: Skin is warm.  Psychiatric: He has a normal mood and affect. His behavior is normal. Judgment and thought content normal.    Assessment/Plan: As above  Doubt infectious process at this point  Could be first dorsal compartment tenosynovitis vs intersection syndrome  Would add NSAID and continue IV abx  No surgical intervention needed presently  [place] A 07/30/2015, 2:04 PM

## 2015-07-30 NOTE — Progress Notes (Signed)
Pharmacy Antibiotic Note  Purvis Sheffielddrian Lanell Olejnik Jr. is a 25 y.o. male admitted on 07/29/2015 with Septic Arthritis vs. Cellulitis.  Patient with h/o carpal tunnel surgery 2 years ago and since continues to have palm swelling.  Today reports warmth, swelling, redness and a low grade fever and chills.  Pharmacy has been consulted for Vancomycin dosing.  Plan:  Zosyn 3.375gm and Vancomycin 1gm IV x 1 given in ED per MD's intial orders  Once current height and weight documented, will order continued Vancomycin regimen  F/U cultures  F/U continuation of Zosyn upon admission     Temp (24hrs), Avg:98.9 F (37.2 C), Min:98.9 F (37.2 C), Max:98.9 F (37.2 C)   Recent Labs Lab 07/30/15 0445  WBC 13.9*  CREATININE 0.82    CrCl cannot be calculated (Unknown ideal weight.).    No Known Allergies  Antimicrobials this admission: 3/12 Vanc >>   3/12 Zosyn >>    Dose adjustments this admission:   Goal: Vancomycin trough: 15-1220mcg/ml (can lower goal once sepsis ruled out)  Microbiology results: 3/12 BCx: ordered  Thank you for allowing pharmacy to be a part of this patient's care.  Maryellen PilePoindexter, Keagon Glascoe Trefz, PharmD 07/30/2015 6:34 AM

## 2015-07-30 NOTE — Progress Notes (Signed)
Pharmacy Antibiotic Note  Jacob Sheffielddrian Lanell Seabrooks Jr. is a 25 y.o. male admitted on 07/29/2015 with Septic Arthritis vs. Cellulitis.  Patient with h/o carpal tunnel surgery 2 years ago and since continues to have palm swelling.  Today reports warmth, swelling, redness and a low grade fever and chills.  Pharmacy has been consulted for Vancomycin dosing.  Plan:  Zosyn 3.375gm and Vancomycin 1gm IV x 1 given in ED per MD's intial orders  Will start vancomycin 1000 mg IV q8h  F/U cultures   Height: 5\' 11"  (180.3 cm) Weight: 250 lb (113.399 kg) IBW/kg (Calculated) : 75.3  Temp (24hrs), Avg:98.6 F (37 C), Min:98.2 F (36.8 C), Max:98.9 F (37.2 C)   Recent Labs Lab 07/30/15 0445  WBC 13.9*  CREATININE 0.82    Estimated Creatinine Clearance: 176.3 mL/min (by C-G formula based on Cr of 0.82).    No Known Allergies  Antimicrobials this admission: 3/12 Vanc >>   3/12 Zosyn >>    Dose adjustments this admission:   Goal: Vancomycin trough: 15-620mcg/ml (can lower goal once sepsis ruled out)  Microbiology results: 3/12 BCx: ordered  Thank you for allowing pharmacy to be a part of this patient's care.   Adalberto ColeNikola Alexxa Sabet, PharmD, BCPS Pager [phone] 07/30/2015 9:19 AM

## 2015-07-30 NOTE — ED Provider Notes (Signed)
CSN: [id number]     Arrival date & time 07/29/15  2311 History   By signing my name below, I, Jacob Nixon, attest that this documentation has been prepared under the direction and in the presence of [name], MD at [phone].  Electronically Signed: Arlan OrganAshley Nixon, ED Scribe. 07/30/2015. 4:23 AM.   Chief Complaint  Patient presents with  . Arm Swelling   The history is provided by the patient. No language interpreter was used.    HPI Comments: Jacob Sheffielddrian Lanell Lozoya Jr., R hand dominant is a 25 y.o. male without any pertinent past medical history who presents to the Emergency Department complaining of constant, ongoing R arm swelling and intermittent pain that has been chronic in nature for 2 years; worsened with redness this evening. Discomfort is described as stabbing and burning. Pt also reports intermittent R sided neck pain. No aggravating or alleviating factors at this time. Pt had hand surgery (carpal tunnel) 2 years ago performed by Dr. August Saucerean and states swelling has been ongoing since time of initial surgery. Pt was then referred to [name] 2 weeks ago as symptoms have not improved. Pt is scheduled for a nerve conduction study on 3/22. He was due to have an MRI of his spine 3 days ago but states he did not have the $50 co-pay at that time.He states he had numbness in his little ringing and middle finger before surgery and now he has numbness in all of his fingers of the right hand. He states he's had chronic swelling of the thenar eminence of his right hand since surgery but tonight it is much worse.  PCP: No PCP Per Patient    Past Medical History  Diagnosis Date  . Kidney stones   . Bronchitis    Past Surgical History  Procedure Laterality Date  . Kidney stone surgery      lithortripsy  . Hand surgery     No family history on file. Social History  Substance Use Topics  . Smoking status: Former Games developermoker  . Smokeless tobacco: None  . Alcohol Use: Yes      Comment: occasionally   employed detailing cars  Review of Systems  Constitutional: Negative for fever and chills.  Respiratory: Negative for shortness of breath.   Cardiovascular: Negative for chest pain.  Gastrointestinal: Negative for nausea, vomiting and abdominal pain.  Musculoskeletal: Positive for joint swelling and arthralgias.  Neurological: Negative for headaches.  Psychiatric/Behavioral: Negative for confusion.  All other systems reviewed and are negative.     Allergies  Review of patient's allergies indicates no known allergies.  Home Medications   Prior to Admission medications   Medication Sig Start Date End Date Taking? Authorizing Provider  HYDROcodone-acetaminophen (NORCO/VICODIN) 5-325 MG tablet Take 1 tablet by mouth every 6 (six) hours as needed for moderate pain.   Yes Historical Provider, MD   Triage Vitals: BP 115/73 mmHg  Pulse 98  Temp(Src) 98.9 F (37.2 C) (Oral)  Resp 18  SpO2 100%  Vital signs normal     Physical Exam  Constitutional: He is oriented to person, place, and time. He appears well-developed and well-nourished.  Non-toxic appearance. He does not appear ill. No distress.  HENT:  Head: Normocephalic and atraumatic.  Right Ear: External ear normal.  Left Ear: External ear normal.  Nose: Nose normal. No mucosal edema or rhinorrhea.  Mouth/Throat: Oropharynx is clear and moist and mucous membranes are normal. No dental abscesses or uvula swelling.  Eyes: Conjunctivae  and EOM are normal. Pupils are equal, round, and reactive to light.  Neck: Normal range of motion and full passive range of motion without pain. Neck supple.  Cardiovascular: Normal rate, regular rhythm and normal heart sounds.  Exam reveals no gallop and no friction rub.   No murmur heard. Pulmonary/Chest: Effort normal and breath sounds normal. No respiratory distress. He has no wheezes. He has no rhonchi. He has no rales. He exhibits no tenderness and no crepitus.   Abdominal: Soft. Normal appearance and bowel sounds are normal. He exhibits no distension. There is no tenderness. There is no rebound and no guarding.  Musculoskeletal: Normal range of motion. He exhibits no edema or tenderness.  Moves all extremities well.  Diffuse swelling and redness of R palm of hand and dorsum of hand extending to mid forearm with warmth and redness to touch   Neurological: He is alert and oriented to person, place, and time. He has normal strength. No cranial nerve deficit.  Skin: Skin is warm, dry and intact. No rash noted. No erythema. No pallor.  Psychiatric: He has a normal mood and affect. His speech is normal and behavior is normal. His mood appears not anxious.  Nursing note and vitals reviewed.         ED Course  Procedures (including critical care time)  Medications  vancomycin (VANCOCIN) IVPB 1000 mg/200 mL premix (0 mg Intravenous Stopped 07/30/15 0636)  piperacillin-tazobactam (ZOSYN) IVPB 3.375 g (0 g Intravenous Stopped 07/30/15 0539)  ketorolac (TORADOL) 30 MG/ML injection 30 mg (30 mg Intravenous Given 07/30/15 0451)  fentaNYL (SUBLIMAZE) injection 50 mcg (50 mcg Intravenous Given 07/30/15 0451)     DIAGNOSTIC STUDIES: Oxygen Saturation is 99% on RA, Normal by my interpretation.    COORDINATION OF CARE: 4:13 AM- Will order blood work. Will give Toradol, Sublimaze, Zofran, and Vancocin. Discussed treatment plan with pt at bedside and pt agreed to plan.    05:49 Dr Julian Reil, hospitalist, admit to [facility] Review Results for orders placed or performed during the hospital encounter of 07/29/15  CBC with Differential  Result Value Ref Range   WBC 13.9 (H) 4.0 - 10.5 K/uL   RBC 4.93 4.22 - 5.81 MIL/uL   Hemoglobin 14.6 13.0 - 17.0 g/dL   HCT 16.1 09.6 - 04.5 %   MCV 86.8 78.0 - 100.0 fL   MCH 29.6 26.0 - 34.0 pg   MCHC 34.1 30.0 - 36.0 g/dL   RDW 40.9 81.1 - 91.4 %   Platelets 349 150 - 400 K/uL   Neutrophils Relative % 65 %    Neutro Abs 8.9 (H) 1.7 - 7.7 K/uL   Lymphocytes Relative 23 %   Lymphs Abs 3.2 0.7 - 4.0 K/uL   Monocytes Relative 11 %   Monocytes Absolute 1.6 (H) 0.1 - 1.0 K/uL   Eosinophils Relative 1 %   Eosinophils Absolute 0.2 0.0 - 0.7 K/uL   Basophils Relative 0 %   Basophils Absolute 0.0 0.0 - 0.1 K/uL  Basic metabolic panel  Result Value Ref Range   Sodium 136 135 - 145 mmol/L   Potassium 3.8 3.5 - 5.1 mmol/L   Chloride 103 101 - 111 mmol/L   CO2 23 22 - 32 mmol/L   Glucose, Bld 95 65 - 99 mg/dL   BUN 13 6 - 20 mg/dL   Creatinine, Ser 7.82 0.61 - 1.24 mg/dL   Calcium 9.0 8.9 - 95.6 mg/dL   GFR calc non Af Amer >60 >60  mL/min   GFR calc Af Amer >60 >60 mL/min   Anion gap 10 5 - 15  Sedimentation rate  Result Value Ref Range   Sed Rate 35 (H) 0 - 16 mm/hr  Uric acid  Result Value Ref Range   Uric Acid, Serum 6.1 4.4 - 7.6 mg/dL   Laboratory interpretation all normal except leukocytosis    MDM   Final diagnoses:  Cellulitis of forearm, right   Plan admission  Devoria Albe, MD, FACEP   I personally performed the services described in this documentation, which was scribed in my presence. The recorded information has been reviewed and considered.  Devoria Albe, MD, Concha Pyo, MD 07/30/15 [id number]

## 2015-07-30 NOTE — ED Notes (Addendum)
Pt stated that the swelling and the started this morning.  Is reporting that the swelling and redness has happened in the last few hours.  Also reporting that the redness and swelling has been growing throughout the evening. No obvious sings of bite marks

## 2015-07-30 NOTE — H&P (Signed)
[facility] History and Physical  Jacob Nixon. AOZ:[id number] DOB: 09-01-1990 DOA: 07/29/2015  Referring physician: EDP PCP: No PCP Per Patient   Chief Complaint: Wrist pain and swelling   HPI: Jacob Nixon. is a 25 y.o. male with history of carpel tunnel surgery of the R hand 2 years ago.  Patient states he has been having intermittent pain of the hand and forearm since that time and never regained feeling in fingers.  Patient presents to the ED with 1 day history of worsening pain, swelling, erythema, of forearm, hand, and wrist.  Pain is sharp, worst just proximal to wrist.  Moving wrist is very painful he says.  Patient is able to move fingers and has palpable radial pulse.  Review of Systems: Systems reviewed.  As above, otherwise negative  Past Medical History  Diagnosis Date  . Kidney stones   . Bronchitis    Past Surgical History  Procedure Laterality Date  . Kidney stone surgery      lithortripsy  . Hand surgery     Social History:  reports that he has quit smoking. He does not have any smokeless tobacco history on file. He reports that he drinks alcohol. He reports that he does not use illicit drugs.  No Known Allergies  No family history on file.   Prior to Admission medications   Medication Sig Start Date End Date Taking? Authorizing Provider  HYDROcodone-acetaminophen (NORCO/VICODIN) 5-325 MG tablet Take 1 tablet by mouth every 6 (six) hours as needed for moderate pain.   Yes Historical Provider, MD   Physical Exam: Filed Vitals:   07/29/15 2354 07/30/15 0113  BP: 126/66 115/73  Pulse: 107 98  Temp: 98.9 F (37.2 C)   Resp: 18 18    BP 115/73 mmHg  Pulse 98  Temp(Src) 98.9 F (37.2 C) (Oral)  Resp 18  SpO2 100%  General Appearance:    Alert, oriented, no distress, appears stated age  Head:    Normocephalic, atraumatic  Eyes:    PERRL, EOMI, sclera non-icteric        Nose:   Nares without drainage or epistaxis. Mucosa,  turbinates normal  Throat:   Moist mucous membranes. Oropharynx without erythema or exudate.  Neck:   Supple. No carotid bruits.  No thyromegaly.  No lymphadenopathy.   Back:     No CVA tenderness, no spinal tenderness  Lungs:     Clear to auscultation bilaterally, without wheezes, rhonchi or rales  Chest wall:    No tenderness to palpitation  Heart:    Regular rate and rhythm without murmurs, gallops, rubs  Abdomen:     Soft, non-tender, nondistended, normal bowel sounds, no organomegaly  Genitalia:    deferred  Rectal:    deferred  Extremities:   Edema, erythema, tenderness of right hand, wrist, and right forearm.  Tissues are still soft, no woody induration.  Has good cap refill in fingers, able to move fingers and has radial pulse in that wrist.  No feeling in fingers but this is chronic.  Pulses:   2+ and symmetric all extremities  Skin:   Skin color, texture, turgor normal, no rashes or lesions  Lymph nodes:   Cervical, supraclavicular, and axillary nodes normal  Neurologic:   CNII-XII intact. Normal strength, sensation and reflexes      throughout    Labs on Admission:  Basic Metabolic Panel:  Recent Labs Lab 07/30/15 0445  NA 136  K 3.8  CL 103  CO2 23  GLUCOSE 95  BUN 13  CREATININE 0.82  CALCIUM 9.0   Liver Function Tests: No results for input(s): AST, ALT, ALKPHOS, BILITOT, PROT, ALBUMIN in the last 168 hours. No results for input(s): LIPASE, AMYLASE in the last 168 hours. No results for input(s): AMMONIA in the last 168 hours. CBC:  Recent Labs Lab 07/30/15 0445  WBC 13.9*  NEUTROABS 8.9*  HGB 14.6  HCT 42.8  MCV 86.8  PLT 349   Cardiac Enzymes: No results for input(s): CKTOTAL, CKMB, CKMBINDEX, TROPONINI in the last 168 hours.  BNP (last 3 results) No results for input(s): PROBNP in the last 8760 hours. CBG: No results for input(s): GLUCAP in the last 168 hours.  Radiological Exams on Admission: No results found.  EKG: Independently  reviewed.  Assessment/Plan Active Problems:   Cellulitis of forearm, right   1. Cellulitis of R forearm and wrist - 1. Got zosyn and vanc in ED 2. Will leave patient on vanc per pharm consult while we obtain MRI of wrist to determine if this is just cellulitis or if there is evidence of deeper infection. 3. If evidence of deeper infection, abscess, or other complication beyond simple cellulitis then likely warrants hand surgery consultation. 4. For now will go ahead and put him on heparin [place] for DVT ppx and let him eat. 5. Dilaudid for pain control    Code Status: Full  Family Communication: Family at bedside Disposition Plan: Admit to inpatient   Time spent: 70 min  Nayson Traweek M. [facility] Pager [phone]  If 7AM-7PM, please contact the day team taking care of the patient [email or web address] Password [email or web address] 07/30/2015, 6:26 AM

## 2015-07-31 ENCOUNTER — Telehealth (HOSPITAL_BASED_OUTPATIENT_CLINIC_OR_DEPARTMENT_OTHER): Payer: Self-pay | Admitting: Emergency Medicine

## 2015-07-31 DIAGNOSIS — L03113 Cellulitis of right upper limb: Principal | ICD-10-CM

## 2015-07-31 LAB — CBC
HEMATOCRIT: 39.4 % (ref 39.0–52.0)
HEMOGLOBIN: 13.8 g/dL (ref 13.0–17.0)
MCH: 29.6 pg (ref 26.0–34.0)
MCHC: 35 g/dL (ref 30.0–36.0)
MCV: 84.4 fL (ref 78.0–100.0)
Platelets: 304 10*3/uL (ref 150–400)
RBC: 4.67 MIL/uL (ref 4.22–5.81)
RDW: 13.6 % (ref 11.5–15.5)
WBC: 9.1 10*3/uL (ref 4.0–10.5)

## 2015-07-31 NOTE — Progress Notes (Signed)
PROGRESS NOTE    Jacob SheffieldAdrian Lanell Desaulniers Jr.  ZOX:[id number]  DOB: 1990/07/07  DOA: 07/29/2015 PCP: No PCP Per Patient Outpatient Specialists: [facility]oped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 No reported puncture wounds. Admitted for presumed cellulitis. MRI right wrist confirms cellulitis. Hand surgeon consulted - no surgical needs seen.   Assessment & Plan:   Cellulitis of right forearm and wrist - Unclear precipitating event. - MRI of right wrist confirms cellulitis without abscess. No septic arthritis or osteomyelitis. - Continue IV Vancomycin and elevate right upper extremity above the level of the heart. - No features of compartment syndrome. Monitor closely. - Hand surgeon consultation appreciated >doubt infectious process and could be first dorsal compartment tenosynovitis versus intersection syndrome. Recommended adding NSAID and continue IV antibiotics. No surgical intervention needed. - Improving. Continue current management for additional 24 hours and then can discharge home on oral antibiotics and NSAIDs with outpatient follow-up with hand surgery/Dr. Janee Nixon. - Blood cultures 2: Pending. Leukocytosis resolved.  S/P right carpal tunnel surgery - Patient has residual and chronic swelling, pain and sensitivity abnormalities.  DVT prophylaxis: Subcutaneous heparin Code Status: Full Family Communication: Spouse sleeping in room & hence could not update. Disposition Plan: DC home possibly 3/14.   Consultants:  Hand surgeon  Procedures:  None  Antimicrobials:  IV Vancomycin 3/12>    Subjective: Right forearm, wrist and hand swelling, redness and pain continued to gradually improve. States that these are better by approximately 50%.  Objective: Filed Vitals:   07/30/15 0900 07/30/15 1434 07/30/15 2141 07/31/15 0600  BP:  110/58 105/60 114/61  Pulse:  83 83 70  Temp:  96 F (35.6 C) 98.1 F (36.7 C) 97.9 F (36.6 C)  TempSrc:  Oral Oral Oral  Resp:  16 17 16   Height: 5\' 11"  (1.803 m)     Weight: 113.399 kg (250 lb)     SpO2:  98% 99% 100%    Intake/Output Summary (Last 24 hours) at 07/31/15 1021 Last data filed at 07/31/15 0600  Gross per 24 hour  Intake   1480 ml  Output      0 ml  Net   1480 ml   Filed Weights   07/30/15 0900  Weight: 113.399 kg (250 lb)    Exam:  General exam: Pleasant young male lying comfortably supine in bed. Respiratory system: Clear. No increased work of breathing. Cardiovascular system: S1 & S2 heard, RRR. No JVD, murmurs, gallops, clicks or pedal edema. Gastrointestinal system: Abdomen is nondistended, soft and nontender. Normal bowel sounds heard. Central nervous system: Alert and oriented. No focal neurological deficits. Extremities: Symmetric 5 x 5 power. Right distal forearm, wrist and proximal hand swelling, warmth and tenderness have improved. Area of induration over the distal dorsal forearm just proximal to the wrist without fluctuation or crepitus. No erythema appreciated. All findings improved compared to yesterday.   Data Reviewed: Basic Metabolic Panel:  Recent Labs Lab 07/30/15 0445  NA 136  K 3.8  CL 103  CO2 23  GLUCOSE 95  BUN 13  CREATININE 0.82  CALCIUM 9.0   Liver Function Tests: No results for input(s): AST, ALT, ALKPHOS, BILITOT, PROT, ALBUMIN in  the last 168 hours. No results for input(s): LIPASE, AMYLASE in the last 168 hours. No results for input(s): AMMONIA in the last 168 hours. CBC:  Recent Labs Lab 07/30/15 0445 07/31/15 0358  WBC 13.9* 9.1  NEUTROABS 8.9*  --   HGB 14.6  13.8  HCT 42.8 39.4  MCV 86.8 84.4  PLT 349 304   Cardiac Enzymes: No results for input(s): CKTOTAL, CKMB, CKMBINDEX, TROPONINI in the last 168 hours. BNP (last 3 results) No results for input(s): PROBNP in the last 8760 hours. CBG: No results for input(s): GLUCAP in the last 168 hours.  Recent Results (from the past 240 hour(s))  Culture, blood (routine x 2)     Status: None (Preliminary result)   Collection Time: 07/30/15  4:45 AM  Result Value Ref Range Status   Specimen Description BLOOD LEFT ANTECUBITAL  Final   Special Requests   Final    BOTTLES DRAWN AEROBIC AND ANAEROBIC 8CC Performed at [facility]    Culture PENDING  Incomplete   Report Status PENDING  Incomplete  Culture, blood (routine x 2)     Status: None (Preliminary result)   Collection Time: 07/30/15  4:45 AM  Result Value Ref Range Status   Specimen Description BLOOD LEFT HAND  Final   Special Requests   Final    BOTTLES DRAWN AEROBIC AND ANAEROBIC 8CC Performed at [facility]    Culture PENDING  Incomplete   Report Status PENDING  Incomplete         Studies: Mr Wrist Right W Wo Contrast  07/30/2015  CLINICAL DATA:  Right arm and wrist pain and swelling for 2 years, worsening over the last day. Evaluate for cellulitis versus septic joint. EXAM: MR OF THE RIGHT WRIST WITHOUT AND WITH CONTRAST TECHNIQUE: Multiplanar multisequence MR imaging of the right wrist was performed both before and after the administration of intravenous contrast. CONTRAST:  20mL MULTIHANCE GADOBENATE DIMEGLUMINE 529 MG/ML IV SOLN COMPARISON:  Thumb radiographs 05/29/2013.  Wrist MRI 09/30/2012. FINDINGS: Ligaments: The scapholunate and lunotriquetral ligaments appear intact. Triangular fibrocartilage: The triangular fibrocartilage appears degenerated but intact. The ulnar variance is neutral. Tendons: There is severe diffuse extensor tenosynovitis involving dorsal compartments 1 through 4. There is associated florid  synovial enhancement in these extensor sheaths. Only a small amount of nonenhancing fluid is present, greatest in the extensor digitorum tendon sheath. No significant flexor tendon abnormalities. Carpal tunnel/median nerve: Unremarkable. Guyon's canal: Unremarkable. Joint/cartilage: There is no significant fluid within any of the wrist joint compartments. Relative to the extensor tendons, there is relatively mild synovial enhancement which is greatest in the distal radioulnar joint. There are stable intraosseous cysts within the lunate and capitate. No erosive changes, subchondral edema or cortical destruction to suggest osteomyelitis. Bones/carpal alignment: The carpal bone alignment is stable.No erosive changes, subchondral edema or cortical destruction to suggest osteomyelitis. Other: There is diffuse soft tissue edema and enhancement within the subcutaneous tissues, greatest dorsally and radially. No focal fluid collections are seen. Compared with the prior examination, the subcutaneous findings are new. The tenosynovitis has worsened overall, although previously demonstrated involvement in the ECU tendon sheath and carpal tunnel has resolved. IMPRESSION: 1. Diffuse subcutaneous edema and enhancement throughout the dorsal and radial aspect of the wrist consistent with cellulitis. 2. Extensive extensor tenosynovitis, progressive compared with prior study from 2014. Previously, there was greater involvement in the ECU tendon sheath and carpal tunnel which has resolved. The chronic/recurrent nature and relative lack of fluid in the  tendon sheaths argues against infection, and chronic inflammatory arthropathy favored. Correlate clinically. 3. Relatively mild synovitis without significant joint fluid to suggest infection. No evidence of osteomyelitis. Electronically Signed   By: Carey Bullocks M.D.   On: 07/30/2015 12:18        Scheduled Meds: . heparin  5,000 Units Subcutaneous 3 times per day  . ibuprofen   600 mg Oral TID  . Influenza vac split quadrivalent PF  0.5 mL Intramuscular Tomorrow-1000  . vancomycin  1,000 mg Intravenous Q8H   Continuous Infusions:   Active Problems:   Cellulitis of forearm, right    Time spent: 15 minutes.    Marcellus Scott, MD, FACP, FHM. [facility] Pager [phone]  If 7PM-7AM, please contact night-coverage [email or web address] Password [email or web address] 07/31/2015, 10:21 AM    LOS: 1 day

## 2015-07-31 NOTE — Care Management Note (Signed)
Case Management Note  Patient Details  Name: Jacob Sheffielddrian Lanell Harder Jr. MRN: [id number] Date of Birth: 04-Sep-1990  Subjective/Objective:  25 y/o m admitted w/R forearm cellulitis. From home.                   Action/Plan:d/c plan home.   Expected Discharge Date:                  Expected Discharge Plan:  Home/Self Care  In-House Referral:     Discharge planning Services  CM Consult  Post Acute Care Choice:    Choice offered to:     DME Arranged:    DME Agency:     HH Arranged:    HH Agency:     Status of Service:  In process, will continue to follow  Medicare Important Message Given:    Date Medicare IM Given:    Medicare IM give by:    Date Additional Medicare IM Given:    Additional Medicare Important Message give by:     If discussed at Long Length of Stay Meetings, dates discussed:    Additional Comments:  Jacob Nixon, Jacob Gabrielsen, RN 07/31/2015, 9:59 AM

## 2015-08-01 MED ORDER — IBUPROFEN 200 MG PO TABS: 600.0000 mg | ORAL_TABLET | Freq: Three times a day (TID) | ORAL | Status: DC | PRN

## 2015-08-01 MED ORDER — DOXYCYCLINE HYCLATE 100 MG PO TABS
100.0000 mg | ORAL_TABLET | Freq: Two times a day (BID) | ORAL | Status: DC
Start: 2015-08-01 — End: 2015-10-04

## 2015-08-01 NOTE — Discharge Summary (Signed)
Physician Discharge Summary  Purvis SheffieldAdrian Lanell Lansky Jr.  ZOX:[id number]  DOB: 06-30-1990  DOA: 07/29/2015  PCP: No PCP Per Patient  Outpatient specialists: Orthopedic/hand surgery: Dr. Mack Hookavid Thompson  Admit date: 07/29/2015 Discharge date: 08/01/2015  Time spent: Greater than 30 minutes  Recommendations for Outpatient Follow-up:  1. Dr. Mack Hookavid Thompson, Orthopedic/Hand surgery in 5 days. Please follow final blood culture results that were sent from the hospital.  Discharge Diagnoses:  Active Problems:   Cellulitis of forearm, right   Discharge Condition: Improved & Stable  Diet recommendation: Regular diet.  Filed Weights   07/30/15 0900  Weight: 113.399 kg (250 lb)    History of present illness:  25 year old male with history of kidney stones, s/p right carpal tunnel surgery ~2 years ago with residual chronic right thenar eminence pain, right distal forearm swelling, persistent numbness of right index and middle finger, right-handed, works [employer], presented to ED with complaints of worsening swelling, pain, erythema from mid forearm distally to her hand. No reported puncture wounds. Admitted for presumed cellulitis. MRI right wrist confirms cellulitis. Hand surgeon consulted - no surgical needs seen.  Hospital Course:   Cellulitis of right forearm and wrist - Unclear precipitating event. - MRI of right wrist confirms cellulitis without abscess. No septic arthritis or osteomyelitis. - Treated empirically with IV Vancomycin and elevated right upper extremity above the level of the heart. - No features of compartment syndrome. Monitor closely. - Hand surgeon consultation appreciated >doubt infectious process and could be first dorsal compartment tenosynovitis versus intersection syndrome. Recommended adding NSAID and continue IV antibiotics. No surgical intervention needed. - Improved.  - Blood cultures 2: Negative to date. Leukocytosis resolved. - As discussed with Dr.  Mina MarbleWeingold on 3/13, we'll discharge on oral doxycycline to complete total one-week treatment and NSAIDs. Advised patient to follow-up with his hand surgeon and he verbalized understanding.  S/P right carpal tunnel surgery - Patient has residual and chronic swelling, pain and sensitivity abnormalities.    Consultants:  Hand surgeon  Procedures:  None   Discharge Exam:  Complaints: Right upper extremity symptoms continue to improve with decreased pain, swelling and resolved redness. Mobility improved.  Filed Vitals:   07/30/15 2141 07/31/15 0600 07/31/15 1310 08/01/15 0508  BP: 105/60 114/61 118/74 120/55  Pulse: 83 70 71 71  Temp: 98.1 F (36.7 C) 97.9 F (36.6 C) 98 F (36.7 C) 97.6 F (36.4 C)  TempSrc: Oral Oral Oral Oral  Resp: 17 16 15 16   Height:      Weight:      SpO2: 99% 100% 98% 99%    General exam: Pleasant young male lying comfortably supine in bed. Respiratory system: Clear. No increased work of breathing. Cardiovascular system: S1 & S2 heard, RRR. No JVD, murmurs, gallops, clicks or pedal edema. Gastrointestinal system: Abdomen is nondistended, soft and nontender. Normal bowel sounds heard. Central nervous system: Alert and oriented. No focal neurological deficits. Extremities: Symmetric 5 x 5 power. Right distal forearm, wrist and proximal hand swelling, warmth and tenderness have improved or even resolved. Area of induration over the distal dorsal forearm just proximal to the wrist without fluctuation or crepitus. No erythema appreciated. All findings have progressively improved since admission.  Discharge Instructions      Discharge Instructions    Call MD for:  persistant nausea and vomiting    Complete by:  As directed      Call MD for:  redness, tenderness, or signs of infection (pain, swelling, redness, odor or green/yellow  discharge around incision site)    Complete by:  As directed      Call MD for:  severe uncontrolled pain    Complete by:  As  directed      Call MD for:  temperature >100.4    Complete by:  As directed      Diet general    Complete by:  As directed      Increase activity slowly    Complete by:  As directed             Medication List    TAKE these medications        doxycycline 100 MG tablet  Commonly known as:  VIBRA-TABS  Take 1 tablet (100 mg total) by mouth 2 (two) times daily.     HYDROcodone-acetaminophen 5-325 MG tablet  Commonly known as:  NORCO/VICODIN  Take 1 tablet by mouth every 6 (six) hours as needed for moderate pain.     ibuprofen 200 MG tablet  Commonly known as:  ADVIL,MOTRIN  Take 3 tablets (600 mg total) by mouth every 8 (eight) hours as needed for fever or mild pain.       Follow-up Information    Follow up with THOMPSON, DAVID A., MD. Schedule an appointment as soon as possible for a visit in 5 days.   Specialty:  Orthopedic Surgery   Contact information:   [address] [place] [address] [phone]       Get Medicines reviewed and adjusted: Please take all your medications with you for your next visit with your Primary MD  Please request your Primary MD to go over all hospital tests and procedure/radiological results at the follow up. Please ask your Primary MD to get all Hospital records sent to his/her office.  If you experience worsening of your admission symptoms, develop shortness of breath, life threatening emergency, suicidal or homicidal thoughts you must seek medical attention immediately by calling 911 or calling your MD immediately if symptoms less severe.  You must read complete instructions/literature along with all the possible adverse reactions/side effects for all the Medicines you take and that have been prescribed to you. Take any new Medicines after you have completely understood and accept all the possible adverse reactions/side effects.   Do not drive when taking pain medications.   Do not take more than prescribed Pain, Sleep and Anxiety  Medications  Special Instructions: If you have smoked or chewed Tobacco in the last 2 yrs please stop smoking, stop any regular Alcohol and or any Recreational drug use.  Wear Seat belts while driving.  Please note  You were cared for by a hospitalist during your hospital stay. Once you are discharged, your primary care physician will handle any further medical issues. Please note that NO REFILLS for any discharge medications will be authorized once you are discharged, as it is imperative that you return to your primary care physician (or establish a relationship with a primary care physician if you do not have one) for your aftercare needs so that they can reassess your need for medications and monitor your lab values.    The results of significant diagnostics from this hospitalization (including imaging, microbiology, ancillary and laboratory) are listed below for reference.    Significant Diagnostic Studies: Mr Wrist Right W Wo Contrast  07/30/2015  CLINICAL DATA:  Right arm and wrist pain and swelling for 2 years, worsening over the last day. Evaluate for cellulitis versus septic joint. EXAM: MR OF THE RIGHT  WRIST WITHOUT AND WITH CONTRAST TECHNIQUE: Multiplanar multisequence MR imaging of the right wrist was performed both before and after the administration of intravenous contrast. CONTRAST:  20mL MULTIHANCE GADOBENATE DIMEGLUMINE 529 MG/ML IV SOLN COMPARISON:  Thumb radiographs 05/29/2013.  Wrist MRI 09/30/2012. FINDINGS: Ligaments: The scapholunate and lunotriquetral ligaments appear intact. Triangular fibrocartilage: The triangular fibrocartilage appears degenerated but intact. The ulnar variance is neutral. Tendons: There is severe diffuse extensor tenosynovitis involving dorsal compartments 1 through 4. There is associated florid synovial enhancement in these extensor sheaths. Only a small amount of nonenhancing fluid is present, greatest in the extensor digitorum tendon sheath. No  significant flexor tendon abnormalities. Carpal tunnel/median nerve: Unremarkable. Guyon's canal: Unremarkable. Joint/cartilage: There is no significant fluid within any of the wrist joint compartments. Relative to the extensor tendons, there is relatively mild synovial enhancement which is greatest in the distal radioulnar joint. There are stable intraosseous cysts within the lunate and capitate. No erosive changes, subchondral edema or cortical destruction to suggest osteomyelitis. Bones/carpal alignment: The carpal bone alignment is stable.No erosive changes, subchondral edema or cortical destruction to suggest osteomyelitis. Other: There is diffuse soft tissue edema and enhancement within the subcutaneous tissues, greatest dorsally and radially. No focal fluid collections are seen. Compared with the prior examination, the subcutaneous findings are new. The tenosynovitis has worsened overall, although previously demonstrated involvement in the ECU tendon sheath and carpal tunnel has resolved. IMPRESSION: 1. Diffuse subcutaneous edema and enhancement throughout the dorsal and radial aspect of the wrist consistent with cellulitis. 2. Extensive extensor tenosynovitis, progressive compared with prior study from 2014. Previously, there was greater involvement in the ECU tendon sheath and carpal tunnel which has resolved. The chronic/recurrent nature and relative lack of fluid in the tendon sheaths argues against infection, and chronic inflammatory arthropathy favored. Correlate clinically. 3. Relatively mild synovitis without significant joint fluid to suggest infection. No evidence of osteomyelitis. Electronically Signed   By: Carey Bullocks M.D.   On: 07/30/2015 12:18    Microbiology: Recent Results (from the past 240 hour(s))  Culture, blood (routine x 2)     Status: None (Preliminary result)   Collection Time: 07/30/15  4:45 AM  Result Value Ref Range Status   Specimen Description BLOOD LEFT ANTECUBITAL   Final   Special Requests BOTTLES DRAWN AEROBIC AND ANAEROBIC 8CC  Final   Culture   Final    NO GROWTH 2 DAYS Performed at [facility]    Report Status PENDING  Incomplete  Culture, blood (routine x 2)     Status: None (Preliminary result)   Collection Time: 07/30/15  4:45 AM  Result Value Ref Range Status   Specimen Description BLOOD LEFT HAND  Final   Special Requests BOTTLES DRAWN AEROBIC AND ANAEROBIC 8CC  Final   Culture   Final    NO GROWTH 2 DAYS Performed at [facility]    Report Status PENDING  Incomplete     Labs: Basic Metabolic Panel:  Recent Labs Lab 07/30/15 0445  NA 136  K 3.8  CL 103  CO2 23  GLUCOSE 95  BUN 13  CREATININE 0.82  CALCIUM 9.0   Liver Function Tests: No results for input(s): AST, ALT, ALKPHOS, BILITOT, PROT, ALBUMIN in the last 168 hours. No results for input(s): LIPASE, AMYLASE in the last 168 hours. No results for input(s): AMMONIA in the last 168 hours. CBC:  Recent Labs Lab 07/30/15 0445 07/31/15 0358  WBC 13.9* 9.1  NEUTROABS 8.9*  --  HGB 14.6 13.8  HCT 42.8 39.4  MCV 86.8 84.4  PLT 349 304   Cardiac Enzymes: No results for input(s): CKTOTAL, CKMB, CKMBINDEX, TROPONINI in the last 168 hours. BNP: BNP (last 3 results) No results for input(s): BNP in the last 8760 hours.  ProBNP (last 3 results) No results for input(s): PROBNP in the last 8760 hours.  CBG: No results for input(s): GLUCAP in the last 168 hours.     Signed:  Marcellus Scott, MD, FACP, FHM. [facility] Pager [phone]  If 7PM-7AM, please contact night-coverage [email or web address] Password [facility] 08/01/2015, 1:38 PM

## 2015-08-01 NOTE — Discharge Instructions (Signed)

## 2015-08-04 LAB — CULTURE, BLOOD (ROUTINE X 2)
CULTURE: NO GROWTH
Culture: NO GROWTH

## 2015-10-02 ENCOUNTER — Encounter (HOSPITAL_COMMUNITY): Payer: Self-pay | Admitting: Emergency Medicine

## 2015-10-02 ENCOUNTER — Emergency Department (HOSPITAL_COMMUNITY): Payer: BLUE CROSS/BLUE SHIELD

## 2015-10-02 ENCOUNTER — Emergency Department (HOSPITAL_COMMUNITY)
Admission: EM | Admit: 2015-10-02 | Discharge: 2015-10-02 | Disposition: A | Discharge status: Home / Self Care | Payer: BLUE CROSS/BLUE SHIELD | Attending: Emergency Medicine | Admitting: Emergency Medicine

## 2015-10-02 DIAGNOSIS — Z79899 Other long term (current) drug therapy: Secondary | ICD-10-CM | POA: Insufficient documentation

## 2015-10-02 DIAGNOSIS — Z792 Long term (current) use of antibiotics: Secondary | ICD-10-CM | POA: Insufficient documentation

## 2015-10-02 DIAGNOSIS — Z87891 Personal history of nicotine dependence: Secondary | ICD-10-CM | POA: Insufficient documentation

## 2015-10-02 DIAGNOSIS — Z8709 Personal history of other diseases of the respiratory system: Secondary | ICD-10-CM | POA: Insufficient documentation

## 2015-10-02 DIAGNOSIS — Z563 Stressful work schedule: Secondary | ICD-10-CM | POA: Insufficient documentation

## 2015-10-02 DIAGNOSIS — R0789 Other chest pain: Secondary | ICD-10-CM

## 2015-10-02 DIAGNOSIS — Z566 Other physical and mental strain related to work: Secondary | ICD-10-CM

## 2015-10-02 DIAGNOSIS — R0602 Shortness of breath: Secondary | ICD-10-CM | POA: Insufficient documentation

## 2015-10-02 DIAGNOSIS — Z87442 Personal history of urinary calculi: Secondary | ICD-10-CM | POA: Insufficient documentation

## 2015-10-02 LAB — BASIC METABOLIC PANEL
ANION GAP: 9 (ref 5–15)
BUN: 7 mg/dL (ref 6–20)
CALCIUM: 9.5 mg/dL (ref 8.9–10.3)
CO2: 25 mmol/L (ref 22–32)
Chloride: 105 mmol/L (ref 101–111)
Creatinine, Ser: 0.97 mg/dL (ref 0.61–1.24)
GFR calc Af Amer: >60 mL/min (ref >60)
GFR calc non Af Amer: >60 mL/min (ref >60)
GLUCOSE: 120 mg/dL — AB (ref 65–99)
Potassium: 4.2 mmol/L (ref 3.5–5.1)
Sodium: 139 mmol/L (ref 135–145)

## 2015-10-02 LAB — CBC
HCT: 42.6 % (ref 39.0–52.0)
HEMOGLOBIN: 14.2 g/dL (ref 13.0–17.0)
MCH: 28.6 pg (ref 26.0–34.0)
MCHC: 33.3 g/dL (ref 30.0–36.0)
MCV: 85.9 fL (ref 78.0–100.0)
Platelets: 331 10*3/uL (ref 150–400)
RBC: 4.96 MIL/uL (ref 4.22–5.81)
RDW: 13.8 % (ref 11.5–15.5)
WBC: 8.4 10*3/uL (ref 4.0–10.5)

## 2015-10-02 LAB — I-STAT TROPONIN, ED: TROPONIN I, POC: 0 ng/mL (ref 0.00–0.08)

## 2015-10-02 MED ORDER — ACETAMINOPHEN 500 MG PO TABS
1000.0000 mg | ORAL_TABLET | Freq: Once | ORAL | Status: AC
  Administered 2015-10-02: 1000 mg via ORAL
  Filled 2015-10-02: qty 2

## 2015-10-02 MED ORDER — GI COCKTAIL ~~LOC~~
30.0000 mL | Freq: Once | ORAL | Status: AC
  Administered 2015-10-02: 30 mL via ORAL
  Filled 2015-10-02: qty 30

## 2015-10-02 MED ORDER — IBUPROFEN 400 MG PO TABS
600.0000 mg | ORAL_TABLET | Freq: Once | ORAL | Status: AC
  Administered 2015-10-02: 600 mg via ORAL
  Filled 2015-10-02: qty 1

## 2015-10-02 NOTE — ED Notes (Signed)
MD at bedside. 

## 2015-10-02 NOTE — Discharge Instructions (Signed)
Take ibuprofen or Tylenol as needed for chest discomfort. Try to minimize stress.  Chest Wall Pain Chest wall pain is pain in or around the bones and muscles of your chest. Sometimes, an injury causes this pain. Sometimes, the cause may not be known. This pain may take several weeks or longer to get better. HOME CARE INSTRUCTIONS  Pay attention to any changes in your symptoms. Take these actions to help with your pain:   Rest as told by your health care provider.   Avoid activities that cause pain. These include any activities that use your chest muscles or your abdominal and side muscles to lift heavy items.   If directed, apply ice to the painful area:  Put ice in a plastic bag.  Place a towel between your skin and the bag.  Leave the ice on for 20 minutes, 2-3 times per day.  Take over-the-counter and prescription medicines only as told by your health care provider.  Do not use tobacco products, including cigarettes, chewing tobacco, and e-cigarettes. If you need help quitting, ask your health care provider.  Keep all follow-up visits as told by your health care provider. This is important. SEEK MEDICAL CARE IF:  You have a fever.  Your chest pain becomes worse.  You have new symptoms. SEEK IMMEDIATE MEDICAL CARE IF:  You have nausea or vomiting.  You feel sweaty or light-headed.  You have a cough with phlegm (sputum) or you cough up blood.  You develop shortness of breath.   This information is not intended to replace advice given to you by your health care provider. Make sure you discuss any questions you have with your health care provider.   Document Released: 05/06/2005 Document Revised: 01/25/2015 Document Reviewed: 08/01/2014 Elsevier Interactive Patient Education [employer]2016 [employer]vier Inc.

## 2015-10-02 NOTE — ED Notes (Signed)
Pt states for the last 3 days he has be having substernal chest pain that comes and goes. Pt states he has been going through a lot of stress with his job and feels like it comes when he is stressed out.

## 2015-10-02 NOTE — ED Provider Notes (Signed)
CSN: [id number]     Arrival date & time 10/02/15  1440 History   First MD Initiated Contact with Patient 10/02/15 1711     Chief Complaint  Patient presents with  . Chest Pain     (Consider location/radiation/quality/duration/timing/severity/associated sxs/prior Treatment) Patient is a 25 y.o. male presenting with chest pain. The history is provided by the patient. No language interpreter was used.  Chest Pain Pain location:  L chest Pain quality: sharp   Pain radiates to:  L shoulder Pain radiates to the back: no   Pain severity:  Mild Onset quality:  Gradual Duration:  4 days Timing:  Intermittent Progression:  Waxing and waning Chronicity:  New Context: stress (stress at work 2/2 deciding whether or not to quit current job)   Relieved by:  Nothing Exacerbated by: talking about work situation. Ineffective treatments:  None tried Associated symptoms: shortness of breath   Associated symptoms: no abdominal pain, no altered mental status, no back pain, no cough, no diaphoresis, no dizziness, no fever, no headache, no nausea, no palpitations, no syncope and not vomiting   Risk factors: male sex   Risk factors: no coronary artery disease, no diabetes mellitus, no high cholesterol, no hypertension, no prior DVT/PE and no smoking     Past Medical History  Diagnosis Date  . Kidney stones   . Bronchitis    Past Surgical History  Procedure Laterality Date  . Kidney stone surgery      lithortripsy  . Hand surgery     No family history on file. Social History  Substance Use Topics  . Smoking status: Former Games developermoker  . Smokeless tobacco: None  . Alcohol Use: Yes     Comment: occasionally     Review of Systems  Constitutional: Negative for fever, chills and diaphoresis.  HENT: Negative for congestion and rhinorrhea.   Eyes: Negative for visual disturbance.  Respiratory: Positive for shortness of breath. Negative for cough.   Cardiovascular: Positive for chest pain. Negative  for palpitations, leg swelling and syncope.  Gastrointestinal: Negative for nausea, vomiting, abdominal pain and diarrhea.  Genitourinary: Negative for dysuria and difficulty urinating.  Musculoskeletal: Negative for back pain.  Skin: Negative for pallor and rash.  Neurological: Negative for dizziness and headaches.  Psychiatric/Behavioral: Negative for confusion.      Allergies  Review of patient's allergies indicates no known allergies.  Home Medications   Prior to Admission medications   Medication Sig Start Date End Date Taking? Authorizing Provider  aspirin 325 MG tablet Take 325 mg by mouth daily.   Yes Historical Provider, MD  Loratadine (CLARITIN) 10 MG CAPS Take 1 capsule by mouth daily.   Yes Historical Provider, MD  doxycycline (VIBRA-TABS) 100 MG tablet Take 1 tablet (100 mg total) by mouth 2 (two) times daily. 08/01/15   Elease EtienneAnand D Hongalgi, MD  ibuprofen (ADVIL,MOTRIN) 200 MG tablet Take 3 tablets (600 mg total) by mouth every 8 (eight) hours as needed for fever or mild pain. 08/01/15   Elease EtienneAnand D Hongalgi, MD   BP 133/70 mmHg  Pulse 105  Temp(Src) 98 F (36.7 C) (Oral)  Resp 16  Ht 5\' 11"  (1.803 m)  Wt 115.667 kg  BMI 35.58 kg/m2  SpO2 98% Physical Exam  Constitutional: He is oriented to person, place, and time. He appears well-developed and well-nourished.  HENT:  Head: Normocephalic and atraumatic.  Eyes: EOM are normal. Pupils are equal, round, and reactive to light.  Neck: Normal range of motion. Neck supple.  Cardiovascular: Normal rate, regular rhythm and intact distal pulses.   Pulmonary/Chest: Effort normal and breath sounds normal. No respiratory distress. He exhibits tenderness (point TTP over left upper anterior chest wall, reproduces symptoms).  Abdominal: Soft. He exhibits no distension. There is no tenderness.  Musculoskeletal: Normal range of motion. He exhibits no edema or tenderness.  Neurological: He is alert and oriented to person, place, and time.   Skin: Skin is warm and dry. No rash noted.  Psychiatric: He has a normal mood and affect.  Nursing note and vitals reviewed.   ED Course  Procedures (including critical care time) Labs Review Labs Reviewed  BASIC METABOLIC PANEL - Abnormal; Notable for the following:    Glucose, Bld 120 (*)    All other components within normal limits  CBC  I-STAT TROPOININ, ED    Imaging Review Dg Chest 2 View  10/02/2015  CLINICAL DATA:  Left-sided chest pain for 4 days EXAM: CHEST  2 VIEW COMPARISON:  03/15/2013 FINDINGS: The heart size and mediastinal contours are within normal limits. Both lungs are clear. The visualized skeletal structures are unremarkable. IMPRESSION: No active cardiopulmonary disease. Electronically Signed   By: Alcide Clever M.D.   On: 10/02/2015 15:38   I have personally reviewed and evaluated these images and lab results as part of my medical decision-making.   EKG Interpretation   Date/Time:  Monday Oct 02 2015 14:48:00 EDT Ventricular Rate:  101 PR Interval:  158 QRS Duration: 78 QT Interval:  306 QTC Calculation: 396 R Axis:   -7 Text Interpretation:  Sinus tachycardia Otherwise normal ECG No  significant change since last tracing Confirmed by Anitra Lauth  MD, Alphonzo Lemmings  [phone]) on 10/02/2015 5:38:03 PM      MDM   Final diagnoses:  Chest pain, atypical  Stress at work    Patient is a previously healthy 25 year old male who presents with concern for left-sided chest pain. He is afebrile and has not had a viral prodrome. Chest x-ray shows a normal cardiac silhouette. Doubt myocarditis or pericarditis. Patient is very low risk for ACS given age and lack of risk factors. EKG does not show ischemic changes. I-STAT troponin is undetectable. He does not have unilateral calf tenderness or swelling, no history of malignancy, no history of immobilization, no  history of bleeding or clotting problems, and is currently a nonsmoker. Doubt PE as patient is very low risk  overall. Chest x-rays shows no signs of pneumonia, pneumothorax or other abnormalities. Chest pain is reproducible on exam his musculoskeletal chest pain likely. In addition patient reports his chest pain is much worse when he is in stressful situations such as at work. Pain is treated with ibuprofen in the ED. Patient reports ibuprofen is not helping and requests another pain medicine.. Tylenol given. However, nursing staff witnessed patient eat an entire cookout meal prior to this. As there is possibly a GI component and patient continued to complain of pain, a GI cocktail was given.  No further labs or interventions necessary at this time. Patient was discharged in stable condition. Will follow up with primary care doctor for reassessment. Strict return precautions given and patient and wife in agreement with plan.   Discussed with Dr. Anitra Lauth, ED attending.     Isa Rankin, MD 10/03/15 6045  Gwyneth Sprout, MD 10/03/15 [phone]

## 2015-10-04 ENCOUNTER — Emergency Department (HOSPITAL_COMMUNITY)
Admission: EM | Admit: 2015-10-04 | Discharge: 2015-10-04 | Disposition: A | Discharge status: Home / Self Care | Payer: BLUE CROSS/BLUE SHIELD | Attending: Emergency Medicine | Admitting: Emergency Medicine

## 2015-10-04 ENCOUNTER — Emergency Department (HOSPITAL_COMMUNITY): Payer: BLUE CROSS/BLUE SHIELD

## 2015-10-04 ENCOUNTER — Encounter (HOSPITAL_COMMUNITY): Payer: Self-pay | Admitting: Emergency Medicine

## 2015-10-04 DIAGNOSIS — Z87891 Personal history of nicotine dependence: Secondary | ICD-10-CM | POA: Insufficient documentation

## 2015-10-04 DIAGNOSIS — Z79899 Other long term (current) drug therapy: Secondary | ICD-10-CM | POA: Insufficient documentation

## 2015-10-04 DIAGNOSIS — Z8709 Personal history of other diseases of the respiratory system: Secondary | ICD-10-CM | POA: Insufficient documentation

## 2015-10-04 DIAGNOSIS — L03114 Cellulitis of left upper limb: Secondary | ICD-10-CM | POA: Insufficient documentation

## 2015-10-04 DIAGNOSIS — Z7982 Long term (current) use of aspirin: Secondary | ICD-10-CM | POA: Insufficient documentation

## 2015-10-04 DIAGNOSIS — Z792 Long term (current) use of antibiotics: Secondary | ICD-10-CM | POA: Insufficient documentation

## 2015-10-04 DIAGNOSIS — Z87442 Personal history of urinary calculi: Secondary | ICD-10-CM | POA: Insufficient documentation

## 2015-10-04 LAB — CBC WITH DIFFERENTIAL/PLATELET
Basophils Absolute: 0 10*3/uL (ref 0.0–0.1)
Basophils Relative: 0 %
Eosinophils Absolute: 0.1 10*3/uL (ref 0.0–0.7)
Eosinophils Relative: 1 %
HCT: 42.3 % (ref 39.0–52.0)
HEMOGLOBIN: 14.8 g/dL (ref 13.0–17.0)
LYMPHS PCT: 16 %
Lymphs Abs: 2.5 10*3/uL (ref 0.7–4.0)
MCH: 29.8 pg (ref 26.0–34.0)
MCHC: 35 g/dL (ref 30.0–36.0)
MCV: 85.3 fL (ref 78.0–100.0)
Monocytes Absolute: 1.6 10*3/uL — ABNORMAL HIGH (ref 0.1–1.0)
Monocytes Relative: 10 %
NEUTROS ABS: 11.5 10*3/uL — AB (ref 1.7–7.7)
NEUTROS PCT: 73 %
Platelets: 310 10*3/uL (ref 150–400)
RBC: 4.96 MIL/uL (ref 4.22–5.81)
RDW: 13.7 % (ref 11.5–15.5)
WBC: 15.7 10*3/uL — ABNORMAL HIGH (ref 4.0–10.5)

## 2015-10-04 LAB — COMPREHENSIVE METABOLIC PANEL
ALBUMIN: 3.6 g/dL (ref 3.5–5.0)
ALK PHOS: 77 U/L (ref 38–126)
ALT: 24 U/L (ref 17–63)
ANION GAP: 9 (ref 5–15)
AST: 23 U/L (ref 15–41)
BUN: 8 mg/dL (ref 6–20)
CALCIUM: 9.2 mg/dL (ref 8.9–10.3)
CO2: 24 mmol/L (ref 22–32)
Chloride: 101 mmol/L (ref 101–111)
Creatinine, Ser: 0.95 mg/dL (ref 0.61–1.24)
GFR calc Af Amer: >60 mL/min (ref >60)
GFR calc non Af Amer: >60 mL/min (ref >60)
GLUCOSE: 105 mg/dL — AB (ref 65–99)
Potassium: 4.2 mmol/L (ref 3.5–5.1)
SODIUM: 134 mmol/L — AB (ref 135–145)
Total Bilirubin: 1.3 mg/dL — ABNORMAL HIGH (ref 0.3–1.2)
Total Protein: 7.6 g/dL (ref 6.5–8.1)

## 2015-10-04 LAB — I-STAT CG4 LACTIC ACID, ED
LACTIC ACID, VENOUS: 0.72 mmol/L (ref 0.5–2.0)
Lactic Acid, Venous: 0.9 mmol/L (ref 0.5–2.0)

## 2015-10-04 MED ORDER — CLINDAMYCIN HCL 300 MG PO CAPS: 300.0000 mg | ORAL_CAPSULE | Freq: Four times a day (QID) | ORAL | Status: DC

## 2015-10-04 MED ORDER — HYDROMORPHONE HCL 1 MG/ML IJ SOLN
1.0000 mg | Freq: Once | INTRAMUSCULAR | Status: AC
  Administered 2015-10-04: 1 mg via INTRAVENOUS
  Filled 2015-10-04: qty 1

## 2015-10-04 MED ORDER — OXYCODONE-ACETAMINOPHEN 5-325 MG PO TABS
1.0000 | ORAL_TABLET | ORAL | Status: DC | PRN
  Administered 2015-10-04: 1 via ORAL

## 2015-10-04 MED ORDER — KETOROLAC TROMETHAMINE 30 MG/ML IJ SOLN
30.0000 mg | Freq: Once | INTRAMUSCULAR | Status: AC
  Administered 2015-10-04: 30 mg via INTRAVENOUS
  Filled 2015-10-04: qty 1

## 2015-10-04 MED ORDER — SODIUM CHLORIDE 0.9 % IV BOLUS (SEPSIS)
1000.0000 mL | Freq: Once | INTRAVENOUS | Status: AC
  Administered 2015-10-04: 1000 mL via INTRAVENOUS

## 2015-10-04 MED ORDER — OXYCODONE-ACETAMINOPHEN 5-325 MG PO TABS: 1.0000 | ORAL_TABLET | ORAL | Status: DC | PRN

## 2015-10-04 MED ORDER — OXYCODONE-ACETAMINOPHEN 5-325 MG PO TABS
ORAL_TABLET | ORAL | Status: AC
  Filled 2015-10-04: qty 1

## 2015-10-04 MED ORDER — CLINDAMYCIN PHOSPHATE 600 MG/50ML IV SOLN
600.0000 mg | Freq: Once | INTRAVENOUS | Status: AC
  Administered 2015-10-04: 600 mg via INTRAVENOUS
  Filled 2015-10-04: qty 50

## 2015-10-04 MED ORDER — IBUPROFEN 600 MG PO TABS: 600.0000 mg | ORAL_TABLET | Freq: Four times a day (QID) | ORAL | Status: DC | PRN

## 2015-10-04 NOTE — ED Notes (Signed)
Pt reports redness and swelling to his left lower arm. Pt has hx of cellulitis. Pt alert x4.

## 2015-10-04 NOTE — ED Provider Notes (Signed)
CSN: [id number]     Arrival date & time 10/04/15  1404 History   First MD Initiated Contact with Patient 10/04/15 [phone]     Chief Complaint  Patient presents with  . Cellulitis     (Consider location/radiation/quality/duration/timing/severity/associated sxs/prior Treatment) The history is provided by the patient and medical records.     Pt with recent hx right arm cellulitis p/w left forearm redness, swelling, pain that feels like prior cellulitis. Symptoms began today.  Has had associated fevers, chills, myalgias.  Denies any injury to the arm.  He waxed his car yesterday, no other different activity.  Is being worked up by orthopedics and rheumatology for similar symptoms, thought to possibly be rheumatoid arthritis with negative labs vs chronic infection (has residual fluid pocket in right forearm).      Past Medical History  Diagnosis Date  . Kidney stones   . Bronchitis    Past Surgical History  Procedure Laterality Date  . Kidney stone surgery      lithortripsy  . Hand surgery     No family history on file. Social History  Substance Use Topics  . Smoking status: Former Games developermoker  . Smokeless tobacco: None  . Alcohol Use: Yes     Comment: occasionally     Review of Systems  All other systems reviewed and are negative.     Allergies  Review of patient's allergies indicates no known allergies.  Home Medications   Prior to Admission medications   Medication Sig Start Date End Date Taking? Authorizing Provider  aspirin 325 MG tablet Take 325 mg by mouth daily.    Historical Provider, MD  doxycycline (VIBRA-TABS) 100 MG tablet Take 1 tablet (100 mg total) by mouth 2 (two) times daily. 08/01/15   Elease EtienneAnand D Hongalgi, MD  ibuprofen (ADVIL,MOTRIN) 200 MG tablet Take 3 tablets (600 mg total) by mouth every 8 (eight) hours as needed for fever or mild pain. 08/01/15   Elease EtienneAnand D Hongalgi, MD  Loratadine (CLARITIN) 10 MG CAPS Take 1 capsule by mouth daily.    Historical Provider, MD     BP 110/70 mmHg  Pulse 115  Temp(Src) 100.1 F (37.8 C) (Oral)  Resp 16  SpO2 96% Physical Exam  Constitutional: He appears well-developed and well-nourished. No distress.  HENT:  Head: Normocephalic and atraumatic.  Neck: Neck supple.  Cardiovascular: Normal rate and regular rhythm.   Pulmonary/Chest: Effort normal and breath sounds normal. No respiratory distress. He has no wheezes. He has no rales.  Abdominal: Soft. He exhibits no distension and no mass. There is no tenderness. There is no rebound and no guarding.  Musculoskeletal:  LUE: Left ulnar wrist with erythema, edema, warmth, tenderness.  Decreased ROM of wrist.  Tenderness throughout forearm.  No break in skin.  No fluctuance.  Moves all fingers.  Capillary refill < 3 seconds.  Radial pulse intact.    Neurological: He is alert. He exhibits normal muscle tone.  Skin: He is not diaphoretic.  Nursing note and vitals reviewed.       ED Course  Procedures (including critical care time) Labs Review Labs Reviewed  COMPREHENSIVE METABOLIC PANEL - Abnormal; Notable for the following:    Sodium 134 (*)    Glucose, Bld 105 (*)    Total Bilirubin 1.3 (*)    All other components within normal limits  CBC WITH DIFFERENTIAL/PLATELET - Abnormal; Notable for the following:    WBC 15.7 (*)    Neutro Abs 11.5 (*)  Monocytes Absolute 1.6 (*)    All other components within normal limits  I-STAT CG4 LACTIC ACID, ED  I-STAT CG4 LACTIC ACID, ED    Imaging Review Dg Wrist Complete Left  10/04/2015  CLINICAL DATA:  Redness and swelling about the left lower arm since yesterday. No known injury. Initial encounter. EXAM: LEFT WRIST - COMPLETE 3+ VIEW COMPARISON:  None. FINDINGS: Soft tissues about the left wrist appear swollen. No soft tissue gas or radiopaque foreign body is identified. No bony or joint abnormality is seen. IMPRESSION: Soft tissue swelling.  Otherwise negative. Electronically Signed   By: Drusilla Kanner M.D.   On:  10/04/2015 17:08   I have personally reviewed and evaluated these images and lab results as part of my medical decision-making.   EKG Interpretation None      MDM   Final diagnoses:  Cellulitis of left forearm    Nontoxic patient with area of cellulitis over ulnar aspect left wrist with mild streaking.  Temp 100.1.  Recent admission for similar symptoms on right arm.  Is being worked up by rheumatology.  Most recent admission treated as infectious cellulitis but per orthopedic consultation may not have been infectious.  Pt also seen by Dr Bebe Shaggy.   Will treat as cellulitis.  Doubt septic joint.  Discussed 2 day recheck with patient and strict return precautions, close follow up with rheumatology.  D/C home with percocet, ibuprofen, clindamycin.  Discussed result, findings, treatment, and follow up  with patient.  Pt given return precautions.  Pt verbalizes understanding and agrees with plan.         Trixie Dredge, PA-C 10/04/15 2001  Zadie Rhine, MD 10/06/15 Moses Manners

## 2015-10-04 NOTE — Discharge Instructions (Signed)
Read the information below.  Use the prescribed medication as directed.  Please discuss all new medications with your pharmacist.  Do not take additional tylenol while taking the prescribed pain medication to avoid overdose.  You may return to the Emergency Department at any time for worsening condition or any new symptoms that concern you.    If you develop uncontrolled pain, weakness or numbness of the extremity, worsening redness or swelling, sustained fevers, or you are unable to move your hand or arm, return to the ER for a recheck.      Cellulitis Cellulitis is an infection of the skin and the tissue beneath it. The infected area is usually red and tender. Cellulitis occurs most often in the arms and lower legs.  CAUSES  Cellulitis is caused by bacteria that enter the skin through cracks or cuts in the skin. The most common types of bacteria that cause cellulitis are staphylococci and streptococci. SIGNS AND SYMPTOMS   Redness and warmth.  Swelling.  Tenderness or pain.  Fever. DIAGNOSIS  Your health care provider can usually determine what is wrong based on a physical exam. Blood tests may also be done. TREATMENT  Treatment usually involves taking an antibiotic medicine. HOME CARE INSTRUCTIONS   Take your antibiotic medicine as directed by your health care provider. Finish the antibiotic even if you start to feel better.  Keep the infected arm or leg elevated to reduce swelling.  Apply a warm cloth to the affected area up to 4 times per day to relieve pain.  Take medicines only as directed by your health care provider.  Keep all follow-up visits as directed by your health care provider. SEEK MEDICAL CARE IF:   You notice red streaks coming from the infected area.  Your red area gets larger or turns dark in color.  Your bone or joint underneath the infected area becomes painful after the skin has healed.  Your infection returns in the same area or another area.  You  notice a swollen bump in the infected area.  You develop new symptoms.  You have a fever. SEEK IMMEDIATE MEDICAL CARE IF:   You feel very sleepy.  You develop vomiting or diarrhea.  You have a general ill feeling (malaise) with muscle aches and pains.   This information is not intended to replace advice given to you by your health care provider. Make sure you discuss any questions you have with your health care provider.   Document Released: 02/13/2005 Document Revised: 01/25/2015 Document Reviewed: 07/22/2011 Elsevier Interactive Patient Education [employer]2016 Elsevier Inc.

## 2015-10-09 ENCOUNTER — Encounter (HOSPITAL_COMMUNITY): Payer: Self-pay | Admitting: *Deleted

## 2015-10-09 ENCOUNTER — Emergency Department (HOSPITAL_COMMUNITY): Payer: BLUE CROSS/BLUE SHIELD

## 2015-10-09 ENCOUNTER — Emergency Department (HOSPITAL_COMMUNITY)
Admission: EM | Admit: 2015-10-09 | Discharge: 2015-10-09 | Disposition: A | Discharge status: Home / Self Care | Payer: BLUE CROSS/BLUE SHIELD | Attending: Emergency Medicine | Admitting: Emergency Medicine

## 2015-10-09 DIAGNOSIS — Z87442 Personal history of urinary calculi: Secondary | ICD-10-CM | POA: Insufficient documentation

## 2015-10-09 DIAGNOSIS — R509 Fever, unspecified: Secondary | ICD-10-CM | POA: Insufficient documentation

## 2015-10-09 DIAGNOSIS — Z87891 Personal history of nicotine dependence: Secondary | ICD-10-CM | POA: Insufficient documentation

## 2015-10-09 DIAGNOSIS — M25432 Effusion, left wrist: Secondary | ICD-10-CM | POA: Insufficient documentation

## 2015-10-09 DIAGNOSIS — Z8709 Personal history of other diseases of the respiratory system: Secondary | ICD-10-CM | POA: Insufficient documentation

## 2015-10-09 DIAGNOSIS — M25532 Pain in left wrist: Secondary | ICD-10-CM | POA: Insufficient documentation

## 2015-10-09 LAB — BASIC METABOLIC PANEL
Anion gap: 5 (ref 5–15)
BUN: 8 mg/dL (ref 6–20)
CALCIUM: 8.9 mg/dL (ref 8.9–10.3)
CO2: 24 mmol/L (ref 22–32)
CREATININE: 0.83 mg/dL (ref 0.61–1.24)
Chloride: 109 mmol/L (ref 101–111)
GFR calc Af Amer: >60 mL/min (ref >60)
GLUCOSE: 127 mg/dL — AB (ref 65–99)
Potassium: 3.8 mmol/L (ref 3.5–5.1)
SODIUM: 138 mmol/L (ref 135–145)

## 2015-10-09 LAB — CBC WITH DIFFERENTIAL/PLATELET
Basophils Absolute: 0 10*3/uL (ref 0.0–0.1)
Basophils Relative: 0 %
EOS ABS: 0.4 10*3/uL (ref 0.0–0.7)
EOS PCT: 5 %
HCT: 39.7 % (ref 39.0–52.0)
Hemoglobin: 13.3 g/dL (ref 13.0–17.0)
LYMPHS ABS: 2.6 10*3/uL (ref 0.7–4.0)
LYMPHS PCT: 34 %
MCH: 28.7 pg (ref 26.0–34.0)
MCHC: 33.5 g/dL (ref 30.0–36.0)
MCV: 85.6 fL (ref 78.0–100.0)
MONO ABS: 0.7 10*3/uL (ref 0.1–1.0)
MONOS PCT: 9 %
Neutro Abs: 4 10*3/uL (ref 1.7–7.7)
Neutrophils Relative %: 52 %
PLATELETS: 321 10*3/uL (ref 150–400)
RBC: 4.64 MIL/uL (ref 4.22–5.81)
RDW: 13.6 % (ref 11.5–15.5)
WBC: 7.8 10*3/uL (ref 4.0–10.5)

## 2015-10-09 MED ORDER — HYDROMORPHONE HCL 1 MG/ML IJ SOLN
1.0000 mg | Freq: Once | INTRAMUSCULAR | Status: AC
  Administered 2015-10-09: 1 mg via INTRAVENOUS
  Filled 2015-10-09: qty 1

## 2015-10-09 MED ORDER — OXYCODONE-ACETAMINOPHEN 5-325 MG PO TABS: 2.0000 | ORAL_TABLET | Freq: Four times a day (QID) | ORAL | Status: DC | PRN

## 2015-10-09 MED ORDER — PREDNISONE 20 MG PO TABS: 40.0000 mg | ORAL_TABLET | Freq: Every day | ORAL | Status: DC

## 2015-10-09 MED ORDER — DEXAMETHASONE SODIUM PHOSPHATE 10 MG/ML IJ SOLN
10.0000 mg | Freq: Once | INTRAMUSCULAR | Status: AC
  Administered 2015-10-09: 10 mg via INTRAVENOUS
  Filled 2015-10-09: qty 1

## 2015-10-09 NOTE — ED Provider Notes (Signed)
CSN: [id number]     Arrival date & time 10/09/15  4098 History   First MD Initiated Contact with Patient 10/09/15 [phone]     Chief Complaint  Patient presents with  . Wrist Pain     (Consider location/radiation/quality/duration/timing/severity/associated sxs/prior Treatment) HPI Comments: Patient presents emergency department with chief complaint of LEFT wrist pain. He states that he was seen on 5/17 for the same. He was treated for cellulitis, and discharged with clindamycin. He states that since then the redness is improved, but the swelling around his wrist and pain has worsened. He reports running a fever to 100.7 on Saturday. He reports increased pain with wrist movement. He states that the pain radiates to his fingers and to his elbow. He denies any injection drug use. 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 He states that he has had similar symptoms in his right wrist in the past, and was admitted for the same in the past. He states that he has been worked up by orthopedics and rheumatology. States that "they're still trying to figure out what this is."  The history is provided by the patient. No language interpreter was used.    Past Medical History  Diagnosis Date  . Kidney stones   . Bronchitis   . Kidney stones    Past Surgical History  Procedure Laterality Date  . Kidney stone surgery      lithortripsy  . Hand surgery     History reviewed. No pertinent family history. Social History  Substance Use Topics  . Smoking status: Former Games developer  . Smokeless tobacco: None  . Alcohol Use: No     Comment: occasionally     Review of Systems  Constitutional: Negative for fever and chills.  Respiratory: Negative for shortness of breath.   Cardiovascular: Negative for chest pain.  Gastrointestinal: Negative for nausea, vomiting, diarrhea and constipation.  Genitourinary: Negative for dysuria.  Musculoskeletal: Positive for joint swelling and arthralgias.  All other systems  reviewed and are negative.     Allergies  Review of patient's allergies indicates no known allergies.  Home Medications   Prior to Admission medications   Medication Sig Start Date End Date Taking? Authorizing Provider  acetaminophen (TYLENOL) 500 MG tablet Take 500 mg by mouth every 6 (six) hours as needed for mild pain.    Historical Provider, MD  clindamycin (CLEOCIN) 300 MG capsule Take 1 capsule (300 mg total) by mouth 4 (four) times daily. X 10 days 10/04/15   Trixie Dredge, PA-C  HYDROcodone-acetaminophen (NORCO/VICODIN) 5-325 MG tablet Take 1 tablet by mouth every 6 (six) hours as needed for moderate pain.    Historical Provider, MD  ibuprofen (ADVIL,MOTRIN) 600 MG tablet Take 1 tablet (600 mg total) by mouth every 6 (six) hours as needed for mild pain or moderate pain. 10/04/15   Trixie Dredge, PA-C  oxyCODONE-acetaminophen (PERCOCET/ROXICET) 5-325 MG tablet Take 1-2 tablets by mouth every 4 (four) hours as needed for severe pain. 10/04/15   Trixie Dredge, PA-C   BP 132/81 mmHg  Pulse 73  Temp(Src) 97.9 F (36.6 C) (Oral)  Resp 18  Ht  (1.803 m)  Wt 116.121 kg  BMI 35.72 kg/m2  SpO2 99% Physical Exam  Constitutional: He is oriented to person, place, and time. He appears well-developed and well-nourished.  HENT:  Head: Normocephalic and atraumatic.  Eyes: Conjunctivae and EOM are normal. Pupils are equal, round, and reactive to light. Right eye exhibits no discharge. Left eye exhibits no  discharge. No scleral icterus.  Neck: Normal range of motion. Neck supple. No JVD present.  Cardiovascular: Normal rate, regular rhythm, normal heart sounds and intact distal pulses.  Exam reveals no gallop and no friction rub.   No murmur heard. Intact distal pulses with brisk capillary refill  Pulmonary/Chest: Effort normal and breath sounds normal. No respiratory distress. He has no wheezes. He has no rales. He exhibits no tenderness.  Abdominal: Soft. He exhibits no distension and no mass.  There is no tenderness. There is no rebound and no guarding.  Musculoskeletal: Normal range of motion. He exhibits no edema or tenderness.  Left wrist mildly swollen, tender to palpation about the left wrist and distal forearm, no obvious abscess  Neurological: He is alert and oriented to person, place, and time.  Skin: Skin is warm and dry.  No obvious erythema Left wrist is warmer than right  Psychiatric: He has a normal mood and affect. His behavior is normal. Judgment and thought content normal.  Nursing note and vitals reviewed.   ED Course  Procedures (including critical care time) Labs Review Labs Reviewed  BASIC METABOLIC PANEL - Abnormal; Notable for the following:    Glucose, Bld 127 (*)    All other components within normal limits  CBC WITH DIFFERENTIAL/PLATELET    Imaging Review Dg Wrist Complete Left  10/09/2015  CLINICAL DATA:  Left wrist pain and swelling for 1 week, recent cellulitis EXAM: LEFT WRIST - COMPLETE 3+ VIEW COMPARISON:  10/04/2015 E FINDINGS: Four views of the left wrist submitted. No acute fracture or subluxation. No periosteal reaction or bony erosion. No radiopaque foreign body. There is soft tissue swelling ulnar aspect of the wrist and distal forearm. IMPRESSION: No acute fracture or subluxation. Soft tissue swelling ulnar aspect of the wrist and distal forearm. Electronically Signed   By: Natasha MeadLiviu  Pop M.D.   On: 10/09/2015 10:14   I have personally reviewed and evaluated these images and lab results as part of my medical decision-making.   MDM   Final diagnoses:  Left wrist pain    Patient with left wrist pain and swelling. Seen recently and treated for cellulitis. States that the redness went down, but the swelling and pain have increased. Reports running fevers at home.  No leukocytosis today. Vital signs are stable. There is no erythema of the joint. He is able to range the joint, though there is some pain. There is no evidence of abscess. No  visible drainable fluid collection.  Plain films are negative for fracture.  Discussed with Dr. Jeraldine LootsLockwood, symptoms not consistent with infection, recommends starting prednisone and giving some decadron here.  Will advise orthopedic/hand follow-up.  Return precautions given.  Patient understands and agrees with the plan.    Roxy Horsemanobert Vishal Sandlin, PA-C 10/09/15 1029  Gerhard Munchobert Lockwood, MD 10/09/15 [phone]

## 2015-10-09 NOTE — Progress Notes (Signed)
Orthopedic Tech Progress Note Patient Details:  Jacob Sheffielddrian Lanell Stringfield Jr. 06/02/1990 [id number]  Ortho Devices Type of Ortho Device: Velcro wrist splint Ortho Device/Splint Location: lue Ortho Device/Splint Interventions: Application   Jacob Nixon 10/09/2015, 10:46 AM

## 2015-10-09 NOTE — ED Notes (Signed)
C/o pain in right wrist onset last tues was seen in ed on wed and started on antibiotics states not getting better.

## 2015-10-09 NOTE — Discharge Instructions (Signed)

## 2015-11-29 ENCOUNTER — Encounter (HOSPITAL_COMMUNITY): Payer: Self-pay

## 2015-11-29 ENCOUNTER — Emergency Department (HOSPITAL_COMMUNITY)
Admission: EM | Admit: 2015-11-29 | Discharge: 2015-11-30 | Disposition: A | Discharge status: Home / Self Care | Payer: Self-pay | Attending: Emergency Medicine | Admitting: Emergency Medicine

## 2015-11-29 ENCOUNTER — Emergency Department (HOSPITAL_COMMUNITY): Payer: Self-pay

## 2015-11-29 DIAGNOSIS — R1032 Left lower quadrant pain: Secondary | ICD-10-CM | POA: Insufficient documentation

## 2015-11-29 DIAGNOSIS — R109 Unspecified abdominal pain: Secondary | ICD-10-CM

## 2015-11-29 DIAGNOSIS — Z87891 Personal history of nicotine dependence: Secondary | ICD-10-CM | POA: Insufficient documentation

## 2015-11-29 LAB — I-STAT CHEM 8, ED
BUN: 7 mg/dL (ref 6–20)
CHLORIDE: 101 mmol/L (ref 101–111)
CREATININE: 0.6 mg/dL — AB (ref 0.61–1.24)
Calcium, Ion: 1.2 mmol/L (ref 1.13–1.30)
Glucose, Bld: 114 mg/dL — ABNORMAL HIGH (ref 65–99)
HEMATOCRIT: 44 % (ref 39.0–52.0)
HEMOGLOBIN: 15 g/dL (ref 13.0–17.0)
POTASSIUM: 3.6 mmol/L (ref 3.5–5.1)
Sodium: 140 mmol/L (ref 135–145)
TCO2: 25 mmol/L (ref 0–100)

## 2015-11-29 LAB — URINALYSIS, ROUTINE W REFLEX MICROSCOPIC
Bilirubin Urine: NEGATIVE
Glucose, UA: NEGATIVE mg/dL
KETONES UR: NEGATIVE mg/dL
LEUKOCYTES UA: NEGATIVE
Nitrite: NEGATIVE
PROTEIN: NEGATIVE mg/dL
SPECIFIC GRAVITY, URINE: 1.021 (ref 1.005–1.030)
pH: 6 (ref 5.0–8.0)

## 2015-11-29 LAB — URINE MICROSCOPIC-ADD ON: Bacteria, UA: NONE SEEN

## 2015-11-29 MED ORDER — OXYCODONE-ACETAMINOPHEN 5-325 MG PO TABS
ORAL_TABLET | ORAL | Status: AC
  Filled 2015-11-29: qty 1

## 2015-11-29 MED ORDER — ONDANSETRON HCL 4 MG/2ML IJ SOLN
4.0000 mg | Freq: Once | INTRAMUSCULAR | Status: AC
  Administered 2015-11-29: 4 mg via INTRAVENOUS
  Filled 2015-11-29: qty 2

## 2015-11-29 MED ORDER — OXYCODONE-ACETAMINOPHEN 5-325 MG PO TABS
2.0000 | ORAL_TABLET | Freq: Once | ORAL | Status: AC
  Administered 2015-11-29: 2 via ORAL
  Filled 2015-11-29: qty 2

## 2015-11-29 MED ORDER — KETOROLAC TROMETHAMINE 30 MG/ML IJ SOLN
30.0000 mg | Freq: Once | INTRAMUSCULAR | Status: AC
  Administered 2015-11-29: 30 mg via INTRAVENOUS
  Filled 2015-11-29: qty 1

## 2015-11-29 MED ORDER — OXYCODONE-ACETAMINOPHEN 5-325 MG PO TABS: 1.0000 | ORAL_TABLET | Freq: Four times a day (QID) | ORAL | Status: DC | PRN

## 2015-11-29 MED ORDER — HYDROMORPHONE HCL 1 MG/ML IJ SOLN
1.0000 mg | Freq: Once | INTRAMUSCULAR | Status: AC
  Administered 2015-11-29: 1 mg via INTRAVENOUS
  Filled 2015-11-29: qty 1

## 2015-11-29 MED ORDER — METHOCARBAMOL 500 MG PO TABS: 500.0000 mg | ORAL_TABLET | Freq: Two times a day (BID) | ORAL | Status: DC

## 2015-11-29 MED ORDER — OXYCODONE-ACETAMINOPHEN 5-325 MG PO TABS
1.0000 | ORAL_TABLET | ORAL | Status: DC | PRN
  Administered 2015-11-29: 1 via ORAL

## 2015-11-29 NOTE — ED Provider Notes (Signed)
CSN: [id number]     Arrival date & time 11/29/15  1651 History   First MD Initiated Contact with Patient 11/29/15 2103     Chief Complaint  Patient presents with  . Flank Pain  . Dysuria     (Consider location/radiation/quality/duration/timing/severity/associated sxs/prior Treatment) HPI Comments: 25 year old male presents to the emergency department for evaluation of left flank pain which has been constant and waxing and waning in severity. Symptoms, overall, have been gradually worsening since onset. Patient states that the pain radiates around to his left lower quadrant. He took one tablet of Percocet last night which provided mild, temporary relief. He complains of nausea as well as some lightheadedness. No vomiting, hematuria, fevers. He has a history of kidney stones and states that his pain today feels similar. He required lithotripsy one year ago by Dr. Laverle Nixon.  Patient is a 25 y.o. male presenting with flank pain. The history is provided by the patient. No language interpreter was used.  Flank Pain This is a new problem. The current episode started yesterday. The problem occurs constantly. The problem has been waxing and waning (but also worsening). Associated symptoms include abdominal pain and nausea. Pertinent negatives include no chills, fever, vomiting or weakness. Nothing aggravates the symptoms. Treatments tried: Percocet. The treatment provided mild relief.    Past Medical History  Diagnosis Date  . Kidney stones   . Bronchitis   . Kidney stones    Past Surgical History  Procedure Laterality Date  . Kidney stone surgery      lithortripsy  . Hand surgery     No family history on file. Social History  Substance Use Topics  . Smoking status: Former Games developer  . Smokeless tobacco: None  . Alcohol Use: No     Comment: occasionally     Review of Systems  Constitutional: Negative for fever and chills.  Gastrointestinal: Positive for nausea and abdominal pain. Negative  for vomiting.  Genitourinary: Positive for flank pain. Negative for hematuria.  Neurological: Positive for light-headedness. Negative for weakness.  All other systems reviewed and are negative.   Allergies  Review of patient's allergies indicates no known allergies.  Home Medications   Prior to Admission medications   Medication Sig Start Date End Date Taking? Authorizing Provider  methocarbamol (ROBAXIN) 500 MG tablet Take 1 tablet (500 mg total) by mouth 2 (two) times daily. 11/29/15   Jacob Madura, PA-C  oxyCODONE-acetaminophen (PERCOCET/ROXICET) 5-325 MG tablet Take 1-2 tablets by mouth every 6 (six) hours as needed for severe pain. 11/29/15   Jacob Madura, PA-C  predniSONE (DELTASONE) 20 MG tablet Take 2 tablets (40 mg total) by mouth daily. Patient not taking: Reported on 11/29/2015 10/09/15   Jacob Horseman, PA-C   BP 108/66 mmHg  Pulse 80  Temp(Src) 98.9 F (37.2 C) (Oral)  Resp 18  SpO2 97%   Physical Exam  Constitutional: He is oriented to person, place, and time. He appears well-developed and well-nourished. No distress.  Nontoxic/nonseptic appearing and in no distress Patient does appear uncomfortable  HENT:  Head: Normocephalic and atraumatic.  Eyes: Conjunctivae and EOM are normal. No scleral icterus.  Neck: Normal range of motion.  Cardiovascular: Normal rate, regular rhythm and intact distal pulses.   Pulmonary/Chest: Effort normal and breath sounds normal. No respiratory distress. He has no wheezes. He has no rales.  Respirations even and unlabored  Abdominal: Soft. There is tenderness. There is no rebound and no guarding.  Soft, obese abdomen. Mild tenderness to palpation  in the left mid and lower quadrants. No peritoneal signs or guarding.  Musculoskeletal: Normal range of motion.  Neurological: He is alert and oriented to person, place, and time. He exhibits normal muscle tone. Coordination normal.  Skin: Skin is warm and dry. No rash noted. He is not  diaphoretic. No erythema. No pallor.  Psychiatric: He has a normal mood and affect. His behavior is normal.  Nursing note and vitals reviewed.   ED Course  Procedures (including critical care time) Labs Review Labs Reviewed  URINALYSIS, ROUTINE W REFLEX MICROSCOPIC (NOT AT [facility]) - Abnormal; Notable for the following:    Hgb urine dipstick TRACE (*)    All other components within normal limits  URINE MICROSCOPIC-ADD ON - Abnormal; Notable for the following:    Squamous Epithelial / LPF 0-5 (*)    All other components within normal limits  I-STAT CHEM 8, ED - Abnormal; Notable for the following:    Creatinine, Ser 0.60 (*)    Glucose, Bld 114 (*)    All other components within normal limits  URINE CULTURE  GC/CHLAMYDIA PROBE AMP ([place]) NOT AT [facility]    Imaging Review [place] Renal  11/29/2015  CLINICAL DATA:  Left flank pain, dysuria, and hematuria for 1 day. History of stones. EXAM: RENAL / URINARY TRACT ULTRASOUND COMPLETE COMPARISON:  CT abdomen and pelvis 11/28/2014 FINDINGS: Right Kidney: Length: 10.1 cm. Echogenicity within normal limits. No mass or hydronephrosis visualized. Left Kidney: Length: 12.6 cm. Echogenicity within normal limits. No mass or hydronephrosis visualized. Bladder: Bladder is incompletely distended. No wall thickening or intraluminal filling defects. Bilateral urine flow jets are demonstrated on color flow Doppler imaging. IMPRESSION: Normal ultrasound appearance of the kidneys and bladder. Electronically Signed   By: Burman NievesWilliam  Nixon M.D.   On: 11/29/2015 23:31     I have personally reviewed and evaluated these images and lab results as part of my medical decision-making.   EKG Interpretation None      MDM   Final diagnoses:  Left flank pain    25 year old male presents to the emergency department for evaluation of left flank pain radiating around to his left mid abdomen and lower quadrant. Symptoms began yesterday and have been waxing and waning in  severity. He has a hx of kidney stones with lithotripsy and states that this pain feels similar to his past kidney stone.  Workup today reveals no UTI. No hematuria. Kidney function preserved and ultrasound with no evidence of hydronephrosis. No stones visualized on ultrasound. Pain is better controlled after IV Dilaudid and Toradol. Question whether patient may have a stone too small to visualize. It is also possible that pain may be secondary to musculoskeletal etiology and spasm. Will refer to urology for follow-up. Patient to be discharged with prescriptions for Percocet and Robaxin. Return precautions discussed and provided. Patient discharged in satisfactory condition with no unaddressed concerns.   Filed Vitals:   11/29/15 1700 11/29/15 1942 11/29/15 2145 11/29/15 2245  BP: 120/73 115/67 122/72 108/66  Pulse: 90 92 85 80  Temp: 98.8 F (37.1 C) 98.9 F (37.2 C)    TempSrc: Oral Oral    Resp: 18 18 20 18   SpO2: 100% 100% 99% 97%     Jacob MaduraKelly Aleshka Corney, PA-C 11/29/15 2359  Lavera Guiseana Duo Liu, MD 11/30/15 1137

## 2015-11-29 NOTE — ED Notes (Signed)
Patient transported to Ultrasound 

## 2015-11-29 NOTE — ED Notes (Signed)
patien here with left flank pain and dysuria x 1 day, hx of kidney stones. denies trauma, no nausea, no vomiting

## 2015-11-29 NOTE — Discharge Instructions (Signed)
Flank Pain °Flank pain refers to pain that is located on the side of the body between the upper abdomen and the back. The pain may occur over a short period of time (acute) or may be long-term or reoccurring (chronic). It may be mild or severe. Flank pain can be caused by many things. °CAUSES  °Some of the more common causes of flank pain include: °· Muscle strains.   °· Muscle spasms.   °· A disease of your spine (vertebral disk disease).   °· A lung infection (pneumonia).   °· Fluid around your lungs (pulmonary edema).   °· A kidney infection.   °· Kidney stones.   °· A very painful skin rash caused by the chickenpox virus (shingles).   °· Gallbladder disease.   °HOME CARE INSTRUCTIONS  °Home care will depend on the cause of your pain. In general, °· Rest as directed by your caregiver. °· Drink enough fluids to keep your urine clear or pale yellow. °· Only take over-the-counter or prescription medicines as directed by your caregiver. Some medicines may help relieve the pain. °· Tell your caregiver about any changes in your pain. °· Follow up with your caregiver as directed. °SEEK IMMEDIATE MEDICAL CARE IF:  °· Your pain is not controlled with medicine.   °· You have new or worsening symptoms. °· Your pain increases.   °· You have abdominal pain.   °· You have shortness of breath.   °· You have persistent nausea or vomiting.   °· You have swelling in your abdomen.   °· You feel faint or pass out.   °· You have blood in your urine. °· You have a fever or persistent symptoms for more than 2-3 days. °· You have a fever and your symptoms suddenly get worse. °MAKE SURE YOU:  °· Understand these instructions. °· Will watch your condition. °· Will get help right away if you are not doing well or get worse. °  °This information is not intended to replace advice given to you by your health care provider. Make sure you discuss any questions you have with your health care provider. °  °Document Released: 06/27/2005 Document  Revised: 01/29/2012 Document Reviewed: 12/19/2011 °Elsevier Interactive Patient Education ©2016 Elsevier Inc. ° °

## 2015-11-30 LAB — GC/CHLAMYDIA PROBE AMP (~~LOC~~) NOT AT ARMC
Chlamydia: NEGATIVE
Neisseria Gonorrhea: NEGATIVE

## 2015-12-01 LAB — URINE CULTURE: Culture: <10000 — AB

## 2016-01-21 ENCOUNTER — Emergency Department (HOSPITAL_COMMUNITY): Payer: Self-pay

## 2016-01-21 ENCOUNTER — Encounter (HOSPITAL_COMMUNITY): Payer: Self-pay | Admitting: Emergency Medicine

## 2016-01-21 ENCOUNTER — Inpatient Hospital Stay (HOSPITAL_COMMUNITY)
Admission: EM | Admit: 2016-01-21 | Discharge: 2016-01-29 | DRG: 603 | Disposition: A | Discharge status: Home / Self Care | Payer: Self-pay | Attending: Internal Medicine | Admitting: Internal Medicine

## 2016-01-21 DIAGNOSIS — R12 Heartburn: Secondary | ICD-10-CM | POA: Diagnosis not present

## 2016-01-21 DIAGNOSIS — R000 Tachycardia, unspecified: Secondary | ICD-10-CM | POA: Diagnosis present

## 2016-01-21 DIAGNOSIS — T380X5A Adverse effect of glucocorticoids and synthetic analogues, initial encounter: Secondary | ICD-10-CM | POA: Diagnosis not present

## 2016-01-21 DIAGNOSIS — L0390 Cellulitis, unspecified: Secondary | ICD-10-CM | POA: Diagnosis present

## 2016-01-21 DIAGNOSIS — L02419 Cutaneous abscess of limb, unspecified: Secondary | ICD-10-CM

## 2016-01-21 DIAGNOSIS — N200 Calculus of kidney: Secondary | ICD-10-CM

## 2016-01-21 DIAGNOSIS — G629 Polyneuropathy, unspecified: Secondary | ICD-10-CM | POA: Diagnosis present

## 2016-01-21 DIAGNOSIS — D72829 Elevated white blood cell count, unspecified: Secondary | ICD-10-CM

## 2016-01-21 DIAGNOSIS — Z87442 Personal history of urinary calculi: Secondary | ICD-10-CM

## 2016-01-21 DIAGNOSIS — R066 Hiccough: Secondary | ICD-10-CM | POA: Diagnosis not present

## 2016-01-21 DIAGNOSIS — M659 Synovitis and tenosynovitis, unspecified: Secondary | ICD-10-CM | POA: Diagnosis present

## 2016-01-21 DIAGNOSIS — L03113 Cellulitis of right upper limb: Principal | ICD-10-CM | POA: Diagnosis present

## 2016-01-21 DIAGNOSIS — Z87891 Personal history of nicotine dependence: Secondary | ICD-10-CM

## 2016-01-21 HISTORY — DX: Cellulitis of right upper limb: L03.113

## 2016-01-21 LAB — CBC WITH DIFFERENTIAL/PLATELET
Basophils Absolute: 0 10*3/uL (ref 0.0–0.1)
Basophils Relative: 0 %
EOS ABS: 0.2 10*3/uL (ref 0.0–0.7)
EOS PCT: 1 %
HCT: 44.1 % (ref 39.0–52.0)
HEMOGLOBIN: 14.7 g/dL (ref 13.0–17.0)
LYMPHS ABS: 2.9 10*3/uL (ref 0.7–4.0)
Lymphocytes Relative: 22 %
MCH: 29.5 pg (ref 26.0–34.0)
MCHC: 33.3 g/dL (ref 30.0–36.0)
MCV: 88.6 fL (ref 78.0–100.0)
MONOS PCT: 7 %
Monocytes Absolute: 0.9 10*3/uL (ref 0.1–1.0)
NEUTROS PCT: 70 %
Neutro Abs: 9.5 10*3/uL — ABNORMAL HIGH (ref 1.7–7.7)
Platelets: 315 10*3/uL (ref 150–400)
RBC: 4.98 MIL/uL (ref 4.22–5.81)
RDW: 13.7 % (ref 11.5–15.5)
WBC: 13.5 10*3/uL — ABNORMAL HIGH (ref 4.0–10.5)

## 2016-01-21 LAB — COMPREHENSIVE METABOLIC PANEL
ALBUMIN: 3.6 g/dL (ref 3.5–5.0)
ALK PHOS: 71 U/L (ref 38–126)
ALT: 20 U/L (ref 17–63)
AST: 23 U/L (ref 15–41)
Anion gap: 7 (ref 5–15)
BUN: 10 mg/dL (ref 6–20)
CALCIUM: 9.2 mg/dL (ref 8.9–10.3)
CO2: 23 mmol/L (ref 22–32)
CREATININE: 0.93 mg/dL (ref 0.61–1.24)
Chloride: 107 mmol/L (ref 101–111)
GFR calc Af Amer: >60 mL/min (ref >60)
GFR calc non Af Amer: >60 mL/min (ref >60)
GLUCOSE: 103 mg/dL — AB (ref 65–99)
Potassium: 3.9 mmol/L (ref 3.5–5.1)
SODIUM: 137 mmol/L (ref 135–145)
Total Bilirubin: 0.8 mg/dL (ref 0.3–1.2)
Total Protein: 7.6 g/dL (ref 6.5–8.1)

## 2016-01-21 LAB — I-STAT CG4 LACTIC ACID, ED: Lactic Acid, Venous: 1.17 mmol/L (ref 0.5–1.9)

## 2016-01-21 NOTE — ED Triage Notes (Signed)
C/o R hand swelling and pain since yesterday.  No known injury.  Also reports fever (Tmax 101.3), dizziness, headache, and generalized body aches.  Pt has a new tattoo on R forearm x 3 weeks.

## 2016-01-22 ENCOUNTER — Encounter (HOSPITAL_COMMUNITY): Payer: Self-pay | Admitting: Family Medicine

## 2016-01-22 DIAGNOSIS — N200 Calculus of kidney: Secondary | ICD-10-CM

## 2016-01-22 DIAGNOSIS — L0390 Cellulitis, unspecified: Secondary | ICD-10-CM | POA: Diagnosis present

## 2016-01-22 DIAGNOSIS — G629 Polyneuropathy, unspecified: Secondary | ICD-10-CM

## 2016-01-22 DIAGNOSIS — L03113 Cellulitis of right upper limb: Secondary | ICD-10-CM

## 2016-01-22 DIAGNOSIS — D72829 Elevated white blood cell count, unspecified: Secondary | ICD-10-CM

## 2016-01-22 LAB — URINALYSIS, ROUTINE W REFLEX MICROSCOPIC
Bilirubin Urine: NEGATIVE
Glucose, UA: NEGATIVE mg/dL
HGB URINE DIPSTICK: NEGATIVE
Ketones, ur: NEGATIVE mg/dL
Leukocytes, UA: NEGATIVE
Nitrite: NEGATIVE
Protein, ur: NEGATIVE mg/dL
SPECIFIC GRAVITY, URINE: 1.025 (ref 1.005–1.030)
pH: 6 (ref 5.0–8.0)

## 2016-01-22 LAB — C-REACTIVE PROTEIN: CRP: 6.4 mg/dL — AB (ref <1.0)

## 2016-01-22 LAB — I-STAT CG4 LACTIC ACID, ED: LACTIC ACID, VENOUS: 0.94 mmol/L (ref 0.5–1.9)

## 2016-01-22 LAB — SEDIMENTATION RATE: Sed Rate: 18 mm/hr — ABNORMAL HIGH (ref 0–16)

## 2016-01-22 MED ORDER — ACETAMINOPHEN 325 MG PO TABS
650.0000 mg | ORAL_TABLET | Freq: Four times a day (QID) | ORAL | Status: DC | PRN
  Filled 2016-01-22: qty 2

## 2016-01-22 MED ORDER — OXYCODONE HCL 5 MG PO TABS
5.0000 mg | ORAL_TABLET | ORAL | Status: DC | PRN
  Administered 2016-01-22 – 2016-01-23 (×4): 5 mg via ORAL
  Filled 2016-01-22 (×4): qty 1

## 2016-01-22 MED ORDER — SODIUM CHLORIDE 0.45 % IV SOLN
INTRAVENOUS | Status: DC
Start: 2016-01-22 — End: 2016-01-29
  Administered 2016-01-22 – 2016-01-28 (×8): via INTRAVENOUS

## 2016-01-22 MED ORDER — MORPHINE SULFATE (PF) 4 MG/ML IV SOLN
4.0000 mg | Freq: Once | INTRAVENOUS | Status: AC
  Administered 2016-01-22: 4 mg via INTRAVENOUS
  Filled 2016-01-22: qty 1

## 2016-01-22 MED ORDER — KETOROLAC TROMETHAMINE 30 MG/ML IJ SOLN
30.0000 mg | Freq: Once | INTRAMUSCULAR | Status: AC
  Administered 2016-01-22: 30 mg via INTRAVENOUS
  Filled 2016-01-22: qty 1

## 2016-01-22 MED ORDER — ACETAMINOPHEN 650 MG RE SUPP: 650.0000 mg | Freq: Four times a day (QID) | RECTAL | Status: DC | PRN

## 2016-01-22 MED ORDER — ENOXAPARIN SODIUM 40 MG/0.4ML ~~LOC~~ SOLN
40.0000 mg | SUBCUTANEOUS | Status: DC
  Administered 2016-01-22 – 2016-01-29 (×8): 40 mg via SUBCUTANEOUS
  Filled 2016-01-22 (×8): qty 0.4

## 2016-01-22 MED ORDER — CLINDAMYCIN PHOSPHATE 900 MG/50ML IV SOLN
900.0000 mg | Freq: Once | INTRAVENOUS | Status: AC
  Administered 2016-01-22: 900 mg via INTRAVENOUS
  Filled 2016-01-22: qty 50

## 2016-01-22 MED ORDER — KETOROLAC TROMETHAMINE 30 MG/ML IJ SOLN
30.0000 mg | Freq: Four times a day (QID) | INTRAMUSCULAR | Status: DC | PRN
  Administered 2016-01-22 – 2016-01-23 (×2): 30 mg via INTRAVENOUS
  Filled 2016-01-22 (×2): qty 1

## 2016-01-22 MED ORDER — HYDROMORPHONE HCL 1 MG/ML IJ SOLN
0.5000 mg | Freq: Once | INTRAMUSCULAR | Status: AC
  Administered 2016-01-22: 0.5 mg via INTRAVENOUS
  Filled 2016-01-22: qty 1

## 2016-01-22 MED ORDER — SODIUM CHLORIDE 0.9 % IV BOLUS (SEPSIS)
500.0000 mL | Freq: Once | INTRAVENOUS | Status: AC
  Administered 2016-01-22: 500 mL via INTRAVENOUS

## 2016-01-22 MED ORDER — HYDROMORPHONE HCL 1 MG/ML IJ SOLN
1.0000 mg | Freq: Once | INTRAMUSCULAR | Status: AC
  Administered 2016-01-22: 1 mg via INTRAVENOUS
  Filled 2016-01-22: qty 1

## 2016-01-22 MED ORDER — CLINDAMYCIN PHOSPHATE 600 MG/50ML IV SOLN
600.0000 mg | Freq: Three times a day (TID) | INTRAVENOUS | Status: DC
  Administered 2016-01-22: 600 mg via INTRAVENOUS
  Filled 2016-01-22 (×3): qty 50

## 2016-01-22 MED ORDER — CLINDAMYCIN PHOSPHATE 600 MG/50ML IV SOLN
600.0000 mg | Freq: Three times a day (TID) | INTRAVENOUS | Status: DC
  Administered 2016-01-22 – 2016-01-23 (×3): 600 mg via INTRAVENOUS
  Filled 2016-01-22 (×5): qty 50

## 2016-01-22 MED ORDER — ONDANSETRON HCL 4 MG/2ML IJ SOLN
4.0000 mg | Freq: Once | INTRAMUSCULAR | Status: AC
  Administered 2016-01-22: 4 mg via INTRAVENOUS
  Filled 2016-01-22: qty 2

## 2016-01-22 MED ORDER — SODIUM CHLORIDE 0.9 % IV SOLN
INTRAVENOUS | Status: AC
  Administered 2016-01-22: 06:00:00 via INTRAVENOUS

## 2016-01-22 MED ORDER — ONDANSETRON HCL 4 MG/2ML IJ SOLN: 4.0000 mg | Freq: Three times a day (TID) | INTRAMUSCULAR | Status: AC | PRN

## 2016-01-22 MED ORDER — HYDROMORPHONE HCL 1 MG/ML IJ SOLN
1.0000 mg | INTRAMUSCULAR | Status: AC | PRN
  Administered 2016-01-22 (×2): 1 mg via INTRAVENOUS
  Filled 2016-01-22 (×2): qty 1

## 2016-01-22 MED ORDER — SODIUM CHLORIDE 0.9 % IV BOLUS (SEPSIS)
1000.0000 mL | Freq: Once | INTRAVENOUS | Status: AC
  Administered 2016-01-22: 1000 mL via INTRAVENOUS

## 2016-01-22 MED ORDER — OXYCODONE HCL 5 MG PO TABS: 5.0000 mg | ORAL_TABLET | ORAL | Status: DC | PRN

## 2016-01-22 MED ORDER — SODIUM CHLORIDE 0.9 % IV BOLUS (SEPSIS)
2000.0000 mL | Freq: Once | INTRAVENOUS | Status: AC
  Administered 2016-01-22: 2000 mL via INTRAVENOUS

## 2016-01-22 NOTE — ED Provider Notes (Signed)
[facility]-EMERGENCY DEPT Provider Note   CSN: [id number] Arrival date & time: 01/21/16  2219     History   Chief Complaint Chief Complaint  Patient presents with  . Other    hand swelling  . Fever  . Dizziness    HPI Jacob Sheffielddrian Lanell Rothman Jr. is a 25 y.o. male.  Patient with no significant medical history presents with complaint of right hand pain and swelling associated with fever, mild nausea and generalized body aches x 1 day. He reports history of previous cellulitis of right hand in the past. He has a new tattoo to right forearm completed 3 weeks ago without any complications or concerning symptoms.   The history is provided by the patient. No language interpreter was used.  Fever   This is a new problem. The current episode started yesterday. The problem occurs constantly. The problem has not changed since onset.Associated symptoms include muscle aches. Pertinent negatives include no chest pain, no vomiting, no congestion, no headaches, no sore throat and no cough.  Dizziness  Associated symptoms: nausea   Associated symptoms: no chest pain, no headaches and no vomiting     Past Medical History:  Diagnosis Date  . Bronchitis   . Kidney stones   . Kidney stones     Patient Active Problem List   Diagnosis Date Noted  . Cellulitis of forearm, right 07/30/2015    Past Surgical History:  Procedure Laterality Date  . HAND SURGERY    . KIDNEY STONE SURGERY     lithortripsy       Home Medications    Prior to Admission medications   Medication Sig Start Date End Date Taking? Authorizing Provider  methocarbamol (ROBAXIN) 500 MG tablet Take 1 tablet (500 mg total) by mouth 2 (two) times daily. 11/29/15   Antony MaduraKelly Humes, PA-C  oxyCODONE-acetaminophen (PERCOCET/ROXICET) 5-325 MG tablet Take 1-2 tablets by mouth every 6 (six) hours as needed for severe pain. 11/29/15   Antony MaduraKelly Humes, PA-C  predniSONE (DELTASONE) 20 MG tablet Take 2 tablets (40 mg total) by mouth daily. Patient  not taking: Reported on 11/29/2015 10/09/15   Roxy Horsemanobert Browning, PA-C    Family History No family history on file.  Social History Social History  Substance Use Topics  . Smoking status: Former Games developermoker  . Smokeless tobacco: Never Used     Comment: States he "vapes"  . Alcohol use No     Comment: occasionally      Allergies   Review of patient's allergies indicates no known allergies.   Review of Systems Review of Systems  Constitutional: Positive for fever.  HENT: Negative.  Negative for congestion and sore throat.   Respiratory: Negative for cough.   Cardiovascular: Negative for chest pain.  Gastrointestinal: Positive for nausea. Negative for abdominal pain and vomiting.  Genitourinary: Negative for dysuria.  Musculoskeletal: Positive for myalgias.       See HPI.  Skin: Positive for color change.  Neurological: Positive for dizziness. Negative for headaches.     Physical Exam Updated Vital Signs BP 116/91 (BP Location: Left Arm)   Pulse 97   Temp 98.3 F (36.8 C) (Oral)   Resp 22   Ht 5\' 11"  (1.803 m)   Wt 114.3 kg   SpO2 99%   BMI 35.15 kg/m   Physical Exam  Constitutional: He is oriented to person, place, and time. He appears well-developed and well-nourished.  Neck: Normal range of motion. Neck supple.  Cardiovascular: Normal rate.   Pulmonary/Chest: Effort normal.  Abdominal: There is no tenderness.  Musculoskeletal:  Right hand moderately swollen and erythematous. General hand tenderness. No redness or tenderness of forearm. Well healed tattoo to volar surface without surrounding erythema.  Neurological: He is alert and oriented to person, place, and time.  Skin: Skin is warm. There is erythema.     ED Treatments / Results  Labs (all labs ordered are listed, but only abnormal results are displayed) Labs Reviewed  COMPREHENSIVE METABOLIC PANEL - Abnormal; Notable for the following:       Result Value   Glucose, Bld 103 (*)    All other components  within normal limits  CBC WITH DIFFERENTIAL/PLATELET - Abnormal; Notable for the following:    WBC 13.5 (*)    Neutro Abs 9.5 (*)    All other components within normal limits  CULTURE, BLOOD (ROUTINE X 2)  CULTURE, BLOOD (ROUTINE X 2)  URINE CULTURE  URINALYSIS, ROUTINE W REFLEX MICROSCOPIC (NOT AT [facility])  I-STAT CG4 LACTIC ACID, ED   Results for orders placed or performed during the hospital encounter of 01/21/16  Comprehensive metabolic panel  Result Value Ref Range   Sodium 137 135 - 145 mmol/L   Potassium 3.9 3.5 - 5.1 mmol/L   Chloride 107 101 - 111 mmol/L   CO2 23 22 - 32 mmol/L   Glucose, Bld 103 (H) 65 - 99 mg/dL   BUN 10 6 - 20 mg/dL   Creatinine, Ser 1.61 0.61 - 1.24 mg/dL   Calcium 9.2 8.9 - 09.6 mg/dL   Total Protein 7.6 6.5 - 8.1 g/dL   Albumin 3.6 3.5 - 5.0 g/dL   AST 23 15 - 41 U/L   ALT 20 17 - 63 U/L   Alkaline Phosphatase 71 38 - 126 U/L   Total Bilirubin 0.8 0.3 - 1.2 mg/dL   GFR calc non Af Amer >60 >60 mL/min   GFR calc Af Amer >60 >60 mL/min   Anion gap 7 5 - 15  CBC with Differential  Result Value Ref Range   WBC 13.5 (H) 4.0 - 10.5 K/uL   RBC 4.98 4.22 - 5.81 MIL/uL   Hemoglobin 14.7 13.0 - 17.0 g/dL   HCT 04.5 40.9 - 81.1 %   MCV 88.6 78.0 - 100.0 fL   MCH 29.5 26.0 - 34.0 pg   MCHC 33.3 30.0 - 36.0 g/dL   RDW 91.4 78.2 - 95.6 %   Platelets 315 150 - 400 K/uL   Neutrophils Relative % 70 %   Neutro Abs 9.5 (H) 1.7 - 7.7 K/uL   Lymphocytes Relative 22 %   Lymphs Abs 2.9 0.7 - 4.0 K/uL   Monocytes Relative 7 %   Monocytes Absolute 0.9 0.1 - 1.0 K/uL   Eosinophils Relative 1 %   Eosinophils Absolute 0.2 0.0 - 0.7 K/uL   Basophils Relative 0 %   Basophils Absolute 0.0 0.0 - 0.1 K/uL  Urinalysis, Routine w reflex microscopic  Result Value Ref Range   Color, Urine YELLOW YELLOW   APPearance CLOUDY (A) CLEAR   Specific Gravity, Urine 1.025 1.005 - 1.030   pH 6.0 5.0 - 8.0   Glucose, UA NEGATIVE NEGATIVE mg/dL   Hgb urine dipstick NEGATIVE  NEGATIVE   Bilirubin Urine NEGATIVE NEGATIVE   Ketones, ur NEGATIVE NEGATIVE mg/dL   Protein, ur NEGATIVE NEGATIVE mg/dL   Nitrite NEGATIVE NEGATIVE   Leukocytes, UA NEGATIVE NEGATIVE  I-Stat CG4 Lactic Acid, ED  Result Value Ref Range   Lactic Acid, Venous 1.17  0.5 - 1.9 mmol/L  I-Stat CG4 Lactic Acid, ED  Result Value Ref Range   Lactic Acid, Venous 0.94 0.5 - 1.9 mmol/L   Dg Chest 2 View  Result Date: 01/21/2016 CLINICAL DATA:  25 year old male with fever. EXAM: CHEST  2 VIEW COMPARISON:  Chest radiograph dated 10/02/2015 FINDINGS: The heart size and mediastinal contours are within normal limits. Both lungs are clear. The visualized skeletal structures are unremarkable. IMPRESSION: No active cardiopulmonary disease. Electronically Signed   By: Elgie Collard M.D.   On: 01/21/2016 23:15    EKG  EKG Interpretation None       Radiology Dg Chest 2 View  Result Date: 01/21/2016 CLINICAL DATA:  25 year old male with fever. EXAM: CHEST  2 VIEW COMPARISON:  Chest radiograph dated 10/02/2015 FINDINGS: The heart size and mediastinal contours are within normal limits. Both lungs are clear. The visualized skeletal structures are unremarkable. IMPRESSION: No active cardiopulmonary disease. Electronically Signed   By: Elgie Collard M.D.   On: 01/21/2016 23:15    Procedures Procedures (including critical care time)  Medications Ordered in ED Medications  sodium chloride 0.9 % bolus 1,000 mL (not administered)  morphine 4 MG/ML injection 4 mg (not administered)  ondansetron (ZOFRAN) injection 4 mg (not administered)  clindamycin (CLEOCIN) IVPB 900 mg (not administered)     Initial Impression / Assessment and Plan / ED Course  I have reviewed the triage vital signs and the nursing notes.  Pertinent labs & imaging results that were available during my care of the patient were reviewed by me and considered in my medical decision making (see chart for details).  Clinical Course     Patient presents with right hand pain, swelling and redness with reported fever at home. Cellulitis is recurrent with history of admission for same.   Pain has been difficult to control after multiple doses pain medications. Clindamycin 900 mg provided. No evidence of sepsis - normal VS, afebrile, normal lactate. He has a mild leukocytosis.   Discussed with Hospitalist. Patient with recurrent cellulitis, pain difficult to manage. Will admit for observation.  Final Clinical Impressions(s) / ED Diagnoses   Final diagnoses:  None    New Prescriptions New Prescriptions   No medications on file     Elpidio Anis, PA-C 01/22/16 0459    Layla Maw Ward, DO 01/22/16 1914

## 2016-01-22 NOTE — Progress Notes (Addendum)
Mr. Jacob Nixon is a 25 year old with pmh  of recurrent cellulitis; who presents with complains of right hand erythema and swelling x 1 day. HR up to 109, soft BPs, WBC 13.5, and lactic acid reassuring at 1.17. Admitted for observation to med surge bed for Abx clindamycin IV and pain control.

## 2016-01-22 NOTE — Progress Notes (Signed)
Texted paged on call [facility], regarding pt's BP 98/58 with light headedness.  Awaiting call back   [facility] Incmanda RN

## 2016-01-22 NOTE — H&P (Signed)
History and Physical    Jacob Nixon. IRJ:[id number] DOB: [birth year] DOA: 01/21/2016  PCP: No PCP Per Patient Patient coming from: home  Chief Complaint: R hand swelling and ttp  HPI: Jacob Nixon. is a 25 y.o. male with medical history significant of right wrist carpal tunnel surgery w/ persistent R fingertip numbness and recurrent R hand cellulitis. pain and swelling of the right hand started 2 day ago. Constant. Getting worse. Traveling proximally up the arm. Patient does not having any tattoo placed on his right forearm which was done approximately 3 weeks ago. No competition from the tattoo. Patient states that he has had multiple similar bouts of cellulitis of the right hand and wrist ever since having carpal tunnel surgery several years ago. Patient states had workups by orthopedics and rheumatology with no definitive answers with regards to why he gets these infections of the right hand. Endorses subjective fevers and nausea and myalgias but denies vomiting, neck stiffness and headache, chest pain, shortness breath, palpitations, disorientation or confusion.   ED Course: *Objective findings outlined below. Started on IV clindamycin and Dilaudid and Toradol.  Review of Systems: As per HPI otherwise 10 point review of systems negative.   Ambulatory Status: No restricted  Past Medical History:  Diagnosis Date  . Bronchitis   . Cellulitis of right hand - RECURRENT   . Kidney stones   . Kidney stones     Past Surgical History:  Procedure Laterality Date  . HAND SURGERY    . KIDNEY STONE SURGERY     lithortripsy    Social History   Social History  . Marital status: Married    Spouse name: N/A  . Number of children: N/A  . Years of education: N/A   Occupational History  . Not on file.   Social History Main Topics  . Smoking status: Former Research scientist (life sciences)  . Smokeless tobacco: Never Used     Comment: States he "vapes"  . Alcohol use No     Comment:  occasionally   . Drug use: No  . Sexual activity: Not on file   Other Topics Concern  . Not on file   Social History Narrative  . No narrative on file    No Known Allergies  Family History  Problem Relation Age of Onset  . Diabetes Other   . Hypertension Other     Prior to Admission medications   Not on File    Physical Exam: Vitals:   01/22/16 0330 01/22/16 0345 01/22/16 0500 01/22/16 0547  BP: 103/58 112/61 97/58 (!) 92/48  Pulse: 89 106 97 94  Resp: _0 Temp:    98.8 F (37.1 C)  TempSrc:    Oral  SpO2: 96% 96%  94%  Weight:    114.3 kg (252 lb)  Height:    _1  (1.803 m)     General:  Appears calm and comfortable Eyes:  PERRL, EOMI, normal lids, iris ENT:  grossly normal hearing, lips & tongue, mmm Neck:  no LAD, masses or thyromegaly Cardiovascular:  RRR, no m/r/g. No LE edema.  Respiratory:  CTA bilaterally, no w/r/r. Normal respiratory effort. Abdomen:  soft, ntnd, NABS Skin: Right midforearm tattoo noted on the ventral surface. Mild erythema and swelling of the right hand predominantly tibial ventral surface. Musculoskeletal:  grossly normal tone BUE/BLE, good ROM, no bony abnormality Psychiatric:  grossly normal mood and affect, speech fluent and appropriate, AOx3 Neurologic:  CN 2-12 grossly intact,  moves all extremities in coordinated fashion, sensation intact  Labs on Admission: I have personally reviewed following labs and imaging studies  CBC:  Recent Labs Lab 01/21/16 2245  WBC 13.5*  NEUTROABS 9.5*  HGB 14.7  HCT 44.1  MCV 88.6  PLT 497   Basic Metabolic Panel:  Recent Labs Lab 01/21/16 2245  NA 137  K 3.9  CL 107  CO2 23  GLUCOSE 103*  BUN 10  CREATININE 0.93  CALCIUM 9.2   GFR: Estimated Creatinine Clearance: 154.8 mL/min (by C-G formula based on SCr of 0.93 mg/dL). Liver Function Tests:  Recent Labs Lab 01/21/16 2245  AST 23  ALT 20  ALKPHOS 71  BILITOT 0.8  PROT 7.6  ALBUMIN 3.6   No results for  input(s): LIPASE, AMYLASE in the last 168 hours. No results for input(s): AMMONIA in the last 168 hours. Coagulation Profile: No results for input(s): INR, PROTIME in the last 168 hours. Cardiac Enzymes: No results for input(s): CKTOTAL, CKMB, CKMBINDEX, TROPONINI in the last 168 hours. BNP (last 3 results) No results for input(s): PROBNP in the last 8760 hours. HbA1C: No results for input(s): HGBA1C in the last 72 hours. CBG: No results for input(s): GLUCAP in the last 168 hours. Lipid Profile: No results for input(s): CHOL, HDL, LDLCALC, TRIG, CHOLHDL, LDLDIRECT in the last 72 hours. Thyroid Function Tests: No results for input(s): TSH, T4TOTAL, FREET4, T3FREE, THYROIDAB in the last 72 hours. Anemia Panel: No results for input(s): VITAMINB12, FOLATE, FERRITIN, TIBC, IRON, RETICCTPCT in the last 72 hours. Urine analysis:    Component Value Date/Time   COLORURINE YELLOW 01/22/2016 0425   APPEARANCEUR CLOUDY (A) 01/22/2016 0425   LABSPEC 1.025 01/22/2016 0425   PHURINE 6.0 01/22/2016 0425   GLUCOSEU NEGATIVE 01/22/2016 0425   HGBUR NEGATIVE 01/22/2016 0425   BILIRUBINUR NEGATIVE 01/22/2016 0425   KETONESUR NEGATIVE 01/22/2016 0425   PROTEINUR NEGATIVE 01/22/2016 0425   UROBILINOGEN 1.0 11/28/2014 1026   NITRITE NEGATIVE 01/22/2016 0425   LEUKOCYTESUR NEGATIVE 01/22/2016 0425    Creatinine Clearance: Estimated Creatinine Clearance: 154.8 mL/min (by C-G formula based on SCr of 0.93 mg/dL).  Sepsis Labs: _0 (procalcitonin:4,lacticidven:4) )No results found for this or any previous visit (from the past 240 hour(s)).   Radiological Exams on Admission: Dg Chest 2 View  Result Date: 01/21/2016 CLINICAL DATA:  25 year old male with fever. EXAM: CHEST  2 VIEW COMPARISON:  Chest radiograph dated 10/02/2015 FINDINGS: The heart size and mediastinal contours are within normal limits. Both lungs are clear. The visualized skeletal structures are unremarkable. IMPRESSION: No active  cardiopulmonary disease. Electronically Signed   By: Anner Crete M.D.   On: 01/21/2016 23:15   MRI R wrist 07/2015: IMPRESSION: 1. Diffuse subcutaneous edema and enhancement throughout the dorsal and radial aspect of the wrist consistent with cellulitis. 2. Extensive extensor tenosynovitis, progressive compared with prior study from 2014. Previously, there was greater involvement in the ECU tendon sheath and carpal tunnel which has resolved. The chronic/recurrent nature and relative lack of fluid in the tendon sheaths argues against infection, and chronic inflammatory arthropathy favored. Correlate clinically. 3. Relatively mild synovitis without significant joint fluid to suggest infection. No evidence of osteomyelitis.   EKG: Independently reviewed.   Assessment/Plan Active Problems:   Cellulitis of forearm, right   Leukocytosis   Neuropathy (HCC)   Nephrolithiasis   Recurring Cellulitis: R hand/wrist. Infectious vs inflammatory??? Multiple episodes ever since carpel tunnel surgery. MRI outlined above performed during cellulitis admission 07/29/2015.  - continue CLinda  -  Consider hand consult if not improving. - Weingold has seen in past.  - ESR, CRP, RA - Toradol - f/u BCX obtained by ED  Neuropathy: present in R fingertips ever since carpel tunnel. At baseline - monitor   Nephrolithiasis: multiple admissions for this in past. UA nml. Asymptomatic - no further mgt at this time  Leukocytosis: suspect from #1. WBC 13.5 w/ L shift. Lactic acid 1.17. AF. Tachycardia. BP stable.  - CBC in am. Treatment as above.   DVT prophylaxis: Lovenox  Code Status: full  Family Communication: none  Disposition Plan: pending improvement  Consults called: none  Admission status: obs    MERRELL, DAVID J MD [facility]  If 7PM-7AM, please contact night-coverage [email or web address] Password [email or web address]  01/22/2016, 10:39 AM

## 2016-01-23 ENCOUNTER — Inpatient Hospital Stay (HOSPITAL_COMMUNITY): Payer: Self-pay

## 2016-01-23 ENCOUNTER — Encounter (HOSPITAL_COMMUNITY): Payer: Self-pay

## 2016-01-23 LAB — URINE CULTURE: CULTURE: NO GROWTH

## 2016-01-23 LAB — BASIC METABOLIC PANEL
Anion gap: 7 (ref 5–15)
BUN: 6 mg/dL (ref 6–20)
CO2: 24 mmol/L (ref 22–32)
CREATININE: 0.88 mg/dL (ref 0.61–1.24)
Calcium: 8.6 mg/dL — ABNORMAL LOW (ref 8.9–10.3)
Chloride: 106 mmol/L (ref 101–111)
GFR calc Af Amer: >60 mL/min (ref >60)
Glucose, Bld: 100 mg/dL — ABNORMAL HIGH (ref 65–99)
Potassium: 4 mmol/L (ref 3.5–5.1)
SODIUM: 137 mmol/L (ref 135–145)

## 2016-01-23 LAB — CBC
HCT: 41.3 % (ref 39.0–52.0)
Hemoglobin: 13.6 g/dL (ref 13.0–17.0)
MCH: 29.3 pg (ref 26.0–34.0)
MCHC: 32.9 g/dL (ref 30.0–36.0)
MCV: 89 fL (ref 78.0–100.0)
PLATELETS: 290 10*3/uL (ref 150–400)
RBC: 4.64 MIL/uL (ref 4.22–5.81)
RDW: 13.6 % (ref 11.5–15.5)
WBC: 10.8 10*3/uL — ABNORMAL HIGH (ref 4.0–10.5)

## 2016-01-23 LAB — RHEUMATOID FACTOR

## 2016-01-23 LAB — HIV ANTIBODY (ROUTINE TESTING W REFLEX): HIV Screen 4th Generation wRfx: NONREACTIVE

## 2016-01-23 MED ORDER — VANCOMYCIN HCL IN DEXTROSE 1-5 GM/200ML-% IV SOLN
1000.0000 mg | Freq: Three times a day (TID) | INTRAVENOUS | Status: DC
  Administered 2016-01-24 – 2016-01-29 (×15): 1000 mg via INTRAVENOUS
  Filled 2016-01-23 (×21): qty 200

## 2016-01-23 MED ORDER — PIPERACILLIN-TAZOBACTAM 3.375 G IVPB 30 MIN
3.3750 g | Freq: Once | INTRAVENOUS | Status: AC
  Administered 2016-01-23: 3.375 g via INTRAVENOUS
  Filled 2016-01-23: qty 50

## 2016-01-23 MED ORDER — VANCOMYCIN HCL IN DEXTROSE 1-5 GM/200ML-% IV SOLN
1000.0000 mg | Freq: Once | INTRAVENOUS | Status: DC
  Filled 2016-01-23: qty 200

## 2016-01-23 MED ORDER — ONDANSETRON HCL 4 MG/2ML IJ SOLN
4.0000 mg | Freq: Four times a day (QID) | INTRAMUSCULAR | Status: DC | PRN
  Administered 2016-01-23: 4 mg via INTRAVENOUS
  Filled 2016-01-23: qty 2

## 2016-01-23 MED ORDER — PROMETHAZINE HCL 25 MG/ML IJ SOLN
25.0000 mg | Freq: Four times a day (QID) | INTRAMUSCULAR | Status: DC | PRN
  Administered 2016-01-25: 25 mg via INTRAVENOUS
  Filled 2016-01-23: qty 1

## 2016-01-23 MED ORDER — ONDANSETRON HCL 4 MG/2ML IJ SOLN
4.0000 mg | INTRAMUSCULAR | Status: DC | PRN
  Administered 2016-01-23 – 2016-01-25 (×7): 4 mg via INTRAVENOUS
  Filled 2016-01-23 (×7): qty 2

## 2016-01-23 MED ORDER — PIPERACILLIN-TAZOBACTAM 3.375 G IVPB
3.3750 g | Freq: Three times a day (TID) | INTRAVENOUS | Status: DC
  Administered 2016-01-23 – 2016-01-25 (×6): 3.375 g via INTRAVENOUS
  Filled 2016-01-23 (×7): qty 50

## 2016-01-23 MED ORDER — HYDROMORPHONE HCL 1 MG/ML IJ SOLN
1.0000 mg | INTRAMUSCULAR | Status: DC | PRN
  Administered 2016-01-23 – 2016-01-28 (×18): 1 mg via INTRAVENOUS
  Filled 2016-01-23 (×20): qty 1

## 2016-01-23 MED ORDER — VANCOMYCIN HCL 10 G IV SOLR
2000.0000 mg | Freq: Once | INTRAVENOUS | Status: AC
  Administered 2016-01-23: 2000 mg via INTRAVENOUS
  Filled 2016-01-23: qty 2000

## 2016-01-23 MED ORDER — OXYCODONE-ACETAMINOPHEN 5-325 MG PO TABS
1.0000 | ORAL_TABLET | ORAL | Status: DC | PRN
  Administered 2016-01-23 – 2016-01-28 (×14): 2 via ORAL
  Filled 2016-01-23 (×16): qty 2

## 2016-01-23 NOTE — Progress Notes (Signed)
Pharmacy Antibiotic Note  Jacob Sheffielddrian Lanell Juba Jr. is a 25 y.o. male admitted on 01/21/2016 with cellulitis.  Pharmacy has been consulted for Zosyn and vancomycin dosing.  Day #1 of broad spectrum abx for recurring cellulitis of R hand. Afebrile, WBC 10.8. SCr stable, CrCl >11100ml/min.  Plan: Start Zosyn 3.375 gm IV q8h (4 hour infusion) Give vancomycin 2g IV x 1, then start vancomycin 1g IV Q8 Monitor clinical picture, renal function, VT prn F/U C&S, abx deescalation / LOT   Height: 5\' 11"  (180.3 cm) Weight: 252 lb (114.3 kg) IBW/kg (Calculated) : 75.3  Temp (24hrs), Avg:99.4 F (37.4 C), Min:98.7 F (37.1 C), Max:100.1 F (37.8 C)   Recent Labs Lab 01/21/16 2245 01/21/16 2301 01/22/16 0139 01/23/16 0451  WBC 13.5*  --   --  10.8*  CREATININE 0.93  --   --  0.88  LATICACIDVEN  --  1.17 0.94  --     Estimated Creatinine Clearance: 163.6 mL/min (by C-G formula based on SCr of 0.88 mg/dL).    No Known Allergies  Antimicrobials this admission: Zosyn 9/5 >>  Vancomycin 9/5 >>   Dose adjustments this admission: n/a  Microbiology results: 9/3 BCx: ngtd 9/4 UCx: sent   Thank you for allowing pharmacy to be a part of this patient's care.  Jacob Nixon,Jacob Nixon 01/23/2016 8:54 AM

## 2016-01-23 NOTE — Progress Notes (Signed)
[facility]                                                                              Patient Demographics  Jacob Nixon, is a 25 y.o. male, DOB - 28-Dec-1990, ZOX:[id number]  Admit date - 01/21/2016   Admitting Physician Norval Morton, MD  Outpatient Primary MD for the patient is No PCP Per Patient  Outpatient specialists:   LOS - 1  days    Chief Complaint  Patient presents with  . Other    hand swelling  . Fever  . Dizziness       Brief summary  Jacob Barcellos. is a 25 y.o. male with medical history significant of right wrist carpal tunnel surgery w/ persistent R fingertip numbness and recurrent R hand cellulitis. pain and swelling of the right hand started 2 day ago. Constant. Getting worse. Traveling proximally up the arm. Patient does not having any tattoo placed on his right forearm which was done approximately 3 weeks ago. No complication from the tattoo. Patient states that he has had multiple similar bouts of cellulitis of the right hand and wrist ever since having carpal tunnel surgery several years ago. Patient states had workups by orthopedics and rheumatology with no definitive answers with regards to why he gets these infections of the right hand. Endorses subjective fevers and nausea and myalgias but denies vomiting, neck stiffness and headache, chest pain, shortness breath, palpitations, disorientation or confusion.  Patient was started on IV clindamycin in ED and admitted for further workup.    Assessment & Plan     Recurrent Cellulitis of the right upper extremity  -  R hand/wrist. RA factor negative, HIV negative ESR only 18, CRP 6.4, not consistent with inflammatory arthritis  - Per patient, swelling is not down, overnight spiking low-grade fevers. DC clindamycin, placed on IV vancomycin and Zosyn - Obtain MRI of the right hand/wrist and right arm. If patient has any abscess, will consult hand surgery, previously has seen Dr.  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 At baseline - monitor   Nephrolithiasis: multiple admissions for this in past. UA nml. Asymptomatic - Currently stable  Leukocytosis: suspect from #1. WBC 13.5 w/ L shift. Lactic acid 1.17. AF. Tachycardia. BP stable.  - CBC in am. Treatment as above.   Code Status: Full CODE STATUS DVT Prophylaxis:  Lovenox Family Communication: Discussed in detail with the patient, all imaging results, lab results explained to the patient   Disposition Plan:  Time Spent in minutes 25 minutes  Procedures:  None  Consultants:   None  Antimicrobials:   IV clindamycin 9/4> DC'd 9/5  IV vancomycin and Zosyn 9/5>>    Medications  Scheduled Meds: . enoxaparin (LOVENOX) injection  40 mg Subcutaneous Q24H  . piperacillin-tazobactam (ZOSYN)  IV  3.375 g Intravenous Q8H  . vancomycin  2,000 mg Intravenous Once  . vancomycin  1,000 mg Intravenous Q8H   Continuous Infusions: . sodium chloride 75 mL/hr at 01/23/16 0156   PRN Meds:.acetaminophen **OR** acetaminophen, HYDROmorphone (DILAUDID) injection, oxyCODONE-acetaminophen   Antibiotics   Anti-infectives  Start     Dose/Rate Route Frequency Ordered Stop   01/23/16 1800  vancomycin (VANCOCIN) IVPB 1000 mg/200 mL premix     1,000 mg 200 mL/hr over 60 Minutes Intravenous Every 8 hours 01/23/16 0857     01/23/16 1600  piperacillin-tazobactam (ZOSYN) IVPB 3.375 g     3.375 g 12.5 mL/hr over 240 Minutes Intravenous Every 8 hours 01/23/16 0857     01/23/16 0900  piperacillin-tazobactam (ZOSYN) IVPB 3.375 g     3.375 g 100 mL/hr over 30 Minutes Intravenous  Once 01/23/16 0845 01/23/16 1032   01/23/16 0900  vancomycin (VANCOCIN) IVPB 1000 mg/200 mL premix  Status:  Discontinued     1,000 mg 200 mL/hr over 60 Minutes Intravenous  Once 01/23/16 0845 01/23/16 0850   01/23/16 0900  vancomycin (VANCOCIN) 2,000 mg in sodium chloride 0.9 % 500 mL  IVPB     2,000 mg 250 mL/hr over 120 Minutes Intravenous  Once 01/23/16 0850     01/22/16 1400  clindamycin (CLEOCIN) IVPB 600 mg  Status:  Discontinued     600 mg 100 mL/hr over 30 Minutes Intravenous Every 8 hours 01/22/16 1043 01/23/16 0845   01/22/16 0600  clindamycin (CLEOCIN) IVPB 600 mg  Status:  Discontinued     600 mg 100 mL/hr over 30 Minutes Intravenous Every 8 hours 01/22/16 0542 01/22/16 1043   01/22/16 0030  clindamycin (CLEOCIN) IVPB 900 mg     900 mg 100 mL/hr over 30 Minutes Intravenous  Once 01/22/16 0017 01/22/16 0409        Subjective:   Jacob Nixon was seen and examined today.  States overnight did not sleep well, was having fevers and chills. Feels that swelling and pain in the right arm not better today. Patient denies dizziness, chest pain, shortness of breath, abdominal pain, N/V/D/C, new weakness, numbess, tingling.    Objective:   Vitals:   01/22/16 2156 01/22/16 2355 01/23/16 0523 01/23/16 0525  BP: (!) 98/58 (!) 110/57 113/61   Pulse:  82 (!) 102   Resp:  16 16   Temp: 98.7 F (37.1 C) 99.1 F (37.3 C) 100 F (37.8 C) 99.6 F (37.6 C)  TempSrc: Oral Oral Oral   SpO2:  96% 97%   Weight:      Height:        Intake/Output Summary (Last 24 hours) at 01/23/16 1113 Last data filed at 01/23/16 0900  Gross per 24 hour  Intake             2145 ml  Output                0 ml  Net             2145 ml     Wt Readings from Last 3 Encounters:  01/22/16 114.3 kg (252 lb)  10/09/15 116.1 kg (256 lb)  10/02/15 115.7 kg (255 lb)     Exam  General: Alert and oriented x 3, NAD  HEENT:  PERRLA, EOMI, Anicteric Sclera, mucous membranes moist.   Neck: Supple, no JVD, no masses  Cardiovascular: S1 S2 auscultated, no rubs, murmurs or gallops. Regular rate and rhythm.  Respiratory: Clear to auscultation bilaterally, no wheezing, rales or rhonchi  Gastrointestinal: Soft, nontender, nondistended, + bowel sounds  Ext: no cyanosis clubbing or  edema. Has some swelling on the right hand, wrist and biceps area however soft and no significant indurated area or any drainage, or any wound  Neuro: AAOx3, Cr N's  II- XII. Strength 5/5 upper and lower extremities bilaterally  Skin: No rashes  Psych: Normal affect and demeanor, alert and oriented x3    Data Reviewed:  I have personally reviewed following labs and imaging studies  Micro Results Recent Results (from the past 240 hour(s))  Culture, blood (Routine x 2)     Status: None (Preliminary result)   Collection Time: 01/21/16 10:45 PM  Result Value Ref Range Status   Specimen Description BLOOD LEFT HAND  Final   Special Requests IN PEDIATRIC BOTTLE 4ML  Final   Culture NO GROWTH < 12 HOURS  Final   Report Status PENDING  Incomplete  Urine culture     Status: None   Collection Time: 01/22/16  4:25 AM  Result Value Ref Range Status   Specimen Description URINE, CLEAN CATCH  Final   Special Requests NONE  Final   Culture NO GROWTH  Final   Report Status 01/23/2016 FINAL  Final    Radiology Reports Dg Chest 2 View  Result Date: 01/21/2016 CLINICAL DATA:  25 year old male with fever. EXAM: CHEST  2 VIEW COMPARISON:  Chest radiograph dated 10/02/2015 FINDINGS: The heart size and mediastinal contours are within normal limits. Both lungs are clear. The visualized skeletal structures are unremarkable. IMPRESSION: No active cardiopulmonary disease. Electronically Signed   By: Anner Crete M.D.   On: 01/21/2016 23:15    Lab Data:  CBC:  Recent Labs Lab 01/21/16 2245 01/23/16 0451  WBC 13.5* 10.8*  NEUTROABS 9.5*  --   HGB 14.7 13.6  HCT 44.1 41.3  MCV 88.6 89.0  PLT 315 989   Basic Metabolic Panel:  Recent Labs Lab 01/21/16 2245 01/23/16 0451  NA 137 137  K 3.9 4.0  CL 107 106  CO2 23 24  GLUCOSE 103* 100*  BUN 10 6  CREATININE 0.93 0.88  CALCIUM 9.2 8.6*   GFR: Estimated Creatinine Clearance: 163.6 mL/min (by C-G formula based on SCr of 0.88  mg/dL). Liver Function Tests:  Recent Labs Lab 01/21/16 2245  AST 23  ALT 20  ALKPHOS 71  BILITOT 0.8  PROT 7.6  ALBUMIN 3.6   No results for input(s): LIPASE, AMYLASE in the last 168 hours. No results for input(s): AMMONIA in the last 168 hours. Coagulation Profile: No results for input(s): INR, PROTIME in the last 168 hours. Cardiac Enzymes: No results for input(s): CKTOTAL, CKMB, CKMBINDEX, TROPONINI in the last 168 hours. BNP (last 3 results) No results for input(s): PROBNP in the last 8760 hours. HbA1C: No results for input(s): HGBA1C in the last 72 hours. CBG: No results for input(s): GLUCAP in the last 168 hours. Lipid Profile: No results for input(s): CHOL, HDL, LDLCALC, TRIG, CHOLHDL, LDLDIRECT in the last 72 hours. Thyroid Function Tests: No results for input(s): TSH, T4TOTAL, FREET4, T3FREE, THYROIDAB in the last 72 hours. Anemia Panel: No results for input(s): VITAMINB12, FOLATE, FERRITIN, TIBC, IRON, RETICCTPCT in the last 72 hours. Urine analysis:    Component Value Date/Time   COLORURINE YELLOW 01/22/2016 0425   APPEARANCEUR CLOUDY (A) 01/22/2016 0425   LABSPEC 1.025 01/22/2016 0425   PHURINE 6.0 01/22/2016 0425   GLUCOSEU NEGATIVE 01/22/2016 0425   HGBUR NEGATIVE 01/22/2016 0425   BILIRUBINUR NEGATIVE 01/22/2016 0425   KETONESUR NEGATIVE 01/22/2016 0425   PROTEINUR NEGATIVE 01/22/2016 0425   UROBILINOGEN 1.0 11/28/2014 1026   NITRITE NEGATIVE 01/22/2016 0425   LEUKOCYTESUR NEGATIVE 01/22/2016 0425     Quantina Dershem M.D. [facility] 01/23/2016, 11:13 AM  Pager: [phone]  Between 7am to 7pm - call Pager - [phone]  After 7pm go to [email or web address] - password [email or web address]  Call night coverage person covering after 7pm

## 2016-01-23 NOTE — Progress Notes (Signed)
RN noticed pt had a vap pen on his bedside table. [facility] and to refrain from vaping while in the room. Pt verbalizes understanding and denies use while in the hospital. RN suggests for it to be brought home with family for safety.

## 2016-01-23 NOTE — Progress Notes (Signed)
Back to room from MRI

## 2016-01-23 NOTE — Progress Notes (Signed)
Pt saline locked and wedding ring removed and given to wife. Pt to MRI.

## 2016-01-24 ENCOUNTER — Inpatient Hospital Stay (HOSPITAL_COMMUNITY): Payer: Self-pay

## 2016-01-24 DIAGNOSIS — D72829 Elevated white blood cell count, unspecified: Secondary | ICD-10-CM

## 2016-01-24 DIAGNOSIS — G629 Polyneuropathy, unspecified: Secondary | ICD-10-CM

## 2016-01-24 LAB — BASIC METABOLIC PANEL
Anion gap: 12 (ref 5–15)
CALCIUM: 8.6 mg/dL — AB (ref 8.9–10.3)
CHLORIDE: 101 mmol/L (ref 101–111)
CO2: 23 mmol/L (ref 22–32)
CREATININE: 0.86 mg/dL (ref 0.61–1.24)
GFR calc non Af Amer: >60 mL/min (ref >60)
GLUCOSE: 105 mg/dL — AB (ref 65–99)
Potassium: 4 mmol/L (ref 3.5–5.1)
Sodium: 136 mmol/L (ref 135–145)

## 2016-01-24 LAB — CBC
HEMATOCRIT: 38.6 % — AB (ref 39.0–52.0)
HEMOGLOBIN: 13 g/dL (ref 13.0–17.0)
MCH: 29.8 pg (ref 26.0–34.0)
MCHC: 33.7 g/dL (ref 30.0–36.0)
MCV: 88.5 fL (ref 78.0–100.0)
Platelets: 311 10*3/uL (ref 150–400)
RBC: 4.36 MIL/uL (ref 4.22–5.81)
RDW: 13.6 % (ref 11.5–15.5)
WBC: 9.9 10*3/uL (ref 4.0–10.5)

## 2016-01-24 MED ORDER — GADOBENATE DIMEGLUMINE 529 MG/ML IV SOLN
20.0000 mL | Freq: Once | INTRAVENOUS | Status: AC
  Administered 2016-01-24: 20 mL via INTRAVENOUS

## 2016-01-24 NOTE — Consult Note (Signed)
Reason for Consult:painful right upper extremity Referring Physician: hospitalists  Jacob Nixon. is an 25 y.o. male.  HPI: the patient is a 25 yo who has a very complex history.  He's had multiple episodes of recurrent right upper extremity cellulitis.  He has been treated by multiple orthopedic physicians in town.  He has had a previous carpal tunnel release that he says did very little for him.  He presented a couple of days ago with painful swollen upper extremity.he had fever and mildly elevated white count.  He had minimally elevated sedimentation rate.  He did have a slightly elevated CRP.  He was placed on broad-spectrum antibiotics and MRIs of the hand and upper extremity were performed.  MRI of the hand shows significant fluid in the volar and dorsal aspects of the hand without rim enhancement suggestive of inflammation but no obvious sign of infection.  MRI of the upper extremity showed a small fluid collection anterior to the proximal biceps tendon.  It showed significant edema and rim enhancement around the biceps muscle without frank fluid collection or sign of obvious infection.  The patient states that the pain in his hand has improved dramatically since admission.the pain seems to be at this point greatest centered over the distal arm and just above the elbow.  The patient is somewhat frustrated given the knees had multiple episodes like this in the past and feels that he has had somewhat of a run around having seen many orthopedist as well as a rheumatologist who had very little to offer.  We are consulted today for the possibility of fluid drainage.  Past Medical History:  Diagnosis Date  . Bronchitis   . Cellulitis of right hand - RECURRENT   . Kidney stones   . Kidney stones     Past Surgical History:  Procedure Laterality Date  . HAND SURGERY    . KIDNEY STONE SURGERY     lithortripsy    Family History  Problem Relation Age of Onset  . Diabetes Other   .  Hypertension Other     Social History:  reports that he has quit smoking. He has never used smokeless tobacco. He reports that he does not drink alcohol or use drugs.  Allergies: No Known Allergies  Medications: I have reviewed the patient's current medications.  Results for orders placed or performed during the hospital encounter of 01/21/16 (from the past 48 hour(s))  CBC     Status: Abnormal   Collection Time: 01/23/16  4:51 AM  Result Value Ref Range   WBC 10.8 (H) 4.0 - 10.5 K/uL   RBC 4.64 4.22 - 5.81 MIL/uL   Hemoglobin 13.6 13.0 - 17.0 g/dL   HCT 41.3 39.0 - 52.0 %   MCV 89.0 78.0 - 100.0 fL   MCH 29.3 26.0 - 34.0 pg   MCHC 32.9 30.0 - 36.0 g/dL   RDW 13.6 11.5 - 15.5 %   Platelets 290 150 - 400 K/uL  Basic metabolic panel     Status: Abnormal   Collection Time: 01/23/16  4:51 AM  Result Value Ref Range   Sodium 137 135 - 145 mmol/L   Potassium 4.0 3.5 - 5.1 mmol/L   Chloride 106 101 - 111 mmol/L   CO2 24 22 - 32 mmol/L   Glucose, Bld 100 (H) 65 - 99 mg/dL   BUN 6 6 - 20 mg/dL   Creatinine, Ser 0.88 0.61 - 1.24 mg/dL   Calcium 8.6 (L) 8.9 -  10.3 mg/dL   GFR calc non Af Amer >60 >60 mL/min   GFR calc Af Amer >60 >60 mL/min    Comment: (NOTE) The eGFR has been calculated using the CKD EPI equation. This calculation has not been validated in all clinical situations. eGFR's persistently <60 mL/min signify possible Chronic Kidney Disease.    Anion gap 7 5 - 15  CBC     Status: Abnormal   Collection Time: 01/24/16  5:40 AM  Result Value Ref Range   WBC 9.9 4.0 - 10.5 K/uL   RBC 4.36 4.22 - 5.81 MIL/uL   Hemoglobin 13.0 13.0 - 17.0 g/dL   HCT 38.6 (L) 39.0 - 52.0 %   MCV 88.5 78.0 - 100.0 fL   MCH 29.8 26.0 - 34.0 pg   MCHC 33.7 30.0 - 36.0 g/dL   RDW 13.6 11.5 - 15.5 %   Platelets 311 150 - 400 K/uL  Basic metabolic panel     Status: Abnormal   Collection Time: 01/24/16  5:40 AM  Result Value Ref Range   Sodium 136 135 - 145 mmol/L   Potassium 4.0 3.5 - 5.1  mmol/L   Chloride 101 101 - 111 mmol/L   CO2 23 22 - 32 mmol/L   Glucose, Bld 105 (H) 65 - 99 mg/dL   BUN <5 (L) 6 - 20 mg/dL   Creatinine, Ser 0.86 0.61 - 1.24 mg/dL   Calcium 8.6 (L) 8.9 - 10.3 mg/dL   GFR calc non Af Amer >60 >60 mL/min   GFR calc Af Amer >60 >60 mL/min    Comment: (NOTE) The eGFR has been calculated using the CKD EPI equation. This calculation has not been validated in all clinical situations. eGFR's persistently <60 mL/min signify possible Chronic Kidney Disease.    Anion gap 12 5 - 15    Mr Humerus Right W Wo Contrast  Result Date: 01/24/2016 CLINICAL DATA:  Right wrist carpal tunnel surgery. Recurrent episodes of cellulitis of the right upper extremity. EXAM: MRI OF THE RIGHT HUMERUS WITHOUT AND WITH CONTRAST TECHNIQUE: Multiplanar, multisequence MR imaging was performed both before and after administration of intravenous contrast. CONTRAST:  67m MULTIHANCE GADOBENATE DIMEGLUMINE 529 MG/ML IV SOLN COMPARISON:  None. FINDINGS: Bones/Joint/Cartilage No marrow signal abnormality. No fracture or dislocation. No aggressive osseous lesion. Normal alignment. No joint effusion. Muscles and Tendons Muscle edema within the lateral deltoid muscle along the fascia concerning for mild myositis. Severe edema surrounding the biceps muscle with fascial enhancement most concerning for fasciitis. Mild soft tissue edema and subcutaneous fat of the lateral humerus with enhancement concerning for cellulitis. Synovitis within the long head of the proximal biceps tendon sheath. Small fluid collection anterior to the long head of the biceps tendon sheath with peripheral enhancement measuring 18 x 8 mm with surrounding edema. Small fluid collection emanating from this collection extending into the subscapularis muscle. Rotator cuff is suboptimally evaluated secondary to large field of view. There is likely tendinosis with a small partial tear of the supraspinatus tendon. Soft tissue No soft tissue  mass.  No other fluid collection or hematoma. IMPRESSION: 1. Severe edema surrounding the biceps muscle with fascial enhancement most concerning for fasciitis secondary to an infectious or less likely inflammatory etiology. Mild soft tissue edema and subcutaneous fat of the lateral humerus with enhancement concerning for cellulitis. 2. Synovitis within the proximal long head of the biceps tendon sheath. 3. Small fluid collection anterior to the long head of the biceps tendon sheath with peripheral enhancement  measuring 18 x 8 mm with surrounding edema. Small fluid collection emanating from this collection extending into the subscapularis muscle. The is a concerning for small abscesses. 4. Muscle edema within the lateral deltoid muscle along the fascia concerning for mild myositis. Electronically Signed   By: Kathreen Devoid   On: 01/24/2016 14:31   Mr Hand Right W Wo Contrast  Result Date: 01/24/2016 CLINICAL DATA:  Recurrent cellulitis. Pain and swelling in the right hand and arm. EXAM: MRI OF THE RIGHT HAND WITHOUT AND WITH CONTRAST TECHNIQUE: Multiplanar, multisequence MR imaging was performed both before and after administration of intravenous contrast. CONTRAST:  20 cc MultiHance COMPARISON:  07/30/2015 FINDINGS: Mild subcutaneous soft tissue swelling/ edema/fluid surrounding the fingers and along the dorsal and palmar aspects of the hand. There is also extensive fluid in the carpal tunnel and extending up into the hand as tenosynovitis. No rim enhancing soft tissue abscess is identified. No findings to suggest septic arthritis or osteomyelitis. No findings for pyomyositis. Stable scattered carpal cysts. IMPRESSION: Diffuse cellulitis but no findings for focal soft tissue abscess. Carpal tunnel and flexor tendon tenosynovitis. No findings for septic arthritis or osteomyelitis. Electronically Signed   By: Marijo Sanes M.D.   On: 01/24/2016 15:58    ROS  ROS: I have reviewed the patient's review of systems  thoroughly and there are no positive responses as relates to the HPI. EXAM: Blood pressure 125/87, pulse 72, temperature 98.1 F (36.7 C), temperature source Oral, resp. rate 20, height '5\' 11"'  (1.803 m), weight 114.3 kg (252 lb), SpO2 100 %. Physical Exam Well-developed well-nourished patient in no acute distress. Alert and oriented x3 HEENT:within normal limits Cardiac: Regular rate and rhythm Pulmonary: Lungs clear to auscultation Abdomen: Soft and nontender.  Normal active bowel sounds  Musculoskeletal: (right upper; minimal erythema.  No warmth.  No tenderness to palpation over the proximal humerus.  Good external rotation strength and full overhead elevation.  There are minimal impingement signs.  There is significant tenderness to palpation over the distal biceps anteriorly.  There is no posterior pain.  There is mild pain over the biceps insertion.  Her forearm has full motion without erythema, warmth, or tenderness to palpation.)  Assessment/Plan: 25 year old male with a complex history of multiple admissions for questionable cellulitis of the upper extremity.  No definitive diagnosis has been made in the past.  He has responded to antibiotics in the past.  The patient has MRI evidence of significant fluid in and around the carpal tunnel and forearm.  He has humeral MRI suggestive of a small fluid collection over the proximal biceps tendon and some edema around the distal biceps muscle but no drainable fluid collection.//This is a very complex case and I am not certain what the diagnosis is.  At some level I believe the diagnosis is more likely inflammatory than infectious but obviously he will need to be treated for infectious etiology.  At this point I would favor placing him into a posterior splint and continually has antibiotics.  I had a prolonged discussion with the patient today to hopefully make him understand that any incision in today possibly inflammatory situation would make him  worse.  There is not an obvious abscess to drain and so I don't think there is any absolute and in my mind there is not even a relative surgical indication.  I will follow him over the next several days to see how he does with antibiotics and immobilization.  I will leave a trial  of steroids up to his treating team.  Sheridyn Canino L 01/24/2016, 8:41 PM   [phone]

## 2016-01-24 NOTE — Progress Notes (Signed)
Orthopedic Tech Progress Note Patient Details:  Jacob Sheffielddrian Lanell Jenny Jr. November 27, 1990 [id number]  Ortho Devices Type of Ortho Device: Ace wrap, Arm sling, Post (long arm) splint Ortho Device/Splint Location: RUE Ortho Device/Splint Interventions: Ordered, Application   Jacob MoccasinHughes, Jacob Nixon 01/24/2016, 9:41 PM

## 2016-01-24 NOTE — Progress Notes (Addendum)
[facility] PROGRESS NOTE  Jacob Nixon. AQT:[id number] DOB: 01-18-1991 DOA: 01/21/2016  PCP: No PCP Per Patient  Brief HPI: This is a 25 year old African-American male with a past medical history of right wrist carpal tunnel surgery in the past with recurrent episodes of cellulitis and pain in the right upper extremity. His been hospitalized for several similar episodes in the past. He came in with pain and swelling in his right hand which migrated up to his right upper arm. Patient was hospitalized for further evaluation.  Past medical history:  Past Medical History:  Diagnosis Date  . Bronchitis   . Cellulitis of right hand - RECURRENT   . Kidney stones   . Kidney stones     Consultants: None  Procedures: None  Antibiotics: Vancomycin and Zosyn  Subjective: Patient still very concerned about the pain and swelling in his right upper arm. Pain is about 6 out of 10 in intensity. He denies any chest pain, shortness of breath. He denies any nausea or vomiting.  Objective:  Vital Signs  Vitals:   01/23/16 0525 01/23/16 1220 01/23/16 2200 01/24/16 0527  BP:  103/67 110/64 119/70  Pulse:  96 83 78  Resp:  _0 Temp: 99.6 F (37.6 C) 97.9 F (36.6 C) 97.9 F (36.6 C) 97.7 F (36.5 C)  TempSrc:  Oral Oral Oral  SpO2:  99% 98% 100%  Weight:      Height:        Intake/Output Summary (Last 24 hours) at 01/24/16 1113 Last data filed at 01/24/16 0600  Gross per 24 hour  Intake              955 ml  Output                0 ml  Net              955 ml   Filed Weights   01/21/16 2233 01/22/16 0547  Weight: 114.3 kg (252 lb) 114.3 kg (252 lb)    General appearance: alert, cooperative, appears stated age and no distress Resp: clear to auscultation bilaterally Cardio: regular rate and rhythm, S1, S2 normal, no murmur, click, rub or gallop GI: soft, non-tender; bowel sounds normal; no masses,  no organomegaly Extremities: Swelling is noted over the  right upper extremity, mainly in the right upper arm compared to the left. There is also some warmth over the right wrist area. Slightly restricted range of motion due to pain. He has good peripheral pulses. Neurologic: Patient is awake and alert. Oriented 3. No focal neurological deficits are noted.  Lab Results:  Data Reviewed: I have personally reviewed following labs and imaging studies  CBC:  Recent Labs Lab 01/21/16 2245 01/23/16 0451 01/24/16 0540  WBC 13.5* 10.8* 9.9  NEUTROABS 9.5*  --   --   HGB 14.7 13.6 13.0  HCT 44.1 41.3 38.6*  MCV 88.6 89.0 88.5  PLT 315 290 562   Basic Metabolic Panel:  Recent Labs Lab 01/21/16 2245 01/23/16 0451 01/24/16 0540  NA 137 137 136  K 3.9 4.0 4.0  CL 107 106 101  CO2 _1 GLUCOSE 103* 100* 105*  BUN 10 6 <5*  CREATININE 0.93 0.88 0.86  CALCIUM 9.2 8.6* 8.6*   GFR: Estimated Creatinine Clearance: 167.4 mL/min (by C-G formula based on SCr of 0.86 mg/dL).  Liver Function Tests:  Recent Labs Lab 01/21/16 2245  AST 23  ALT 20  ALKPHOS  71  BILITOT 0.8  PROT 7.6  ALBUMIN 3.6   Urine analysis:    Component Value Date/Time   COLORURINE YELLOW 01/22/2016 0425   APPEARANCEUR CLOUDY (A) 01/22/2016 0425   LABSPEC 1.025 01/22/2016 0425   PHURINE 6.0 01/22/2016 0425   GLUCOSEU NEGATIVE 01/22/2016 0425   HGBUR NEGATIVE 01/22/2016 0425   BILIRUBINUR NEGATIVE 01/22/2016 0425   KETONESUR NEGATIVE 01/22/2016 0425   PROTEINUR NEGATIVE 01/22/2016 0425   UROBILINOGEN 1.0 11/28/2014 1026   NITRITE NEGATIVE 01/22/2016 0425   LEUKOCYTESUR NEGATIVE 01/22/2016 0425    Recent Results (from the past 240 hour(s))  Culture, blood (Routine x 2)     Status: None (Preliminary result)   Collection Time: 01/21/16 10:45 PM  Result Value Ref Range Status   Specimen Description BLOOD LEFT HAND  Final   Special Requests IN PEDIATRIC BOTTLE 4ML  Final   Culture NO GROWTH 2 DAYS  Final   Report Status PENDING  Incomplete  Culture,  blood (Routine x 2)     Status: None (Preliminary result)   Collection Time: 01/22/16  1:27 AM  Result Value Ref Range Status   Specimen Description BLOOD LEFT ARM  Final   Special Requests BOTTLES DRAWN AEROBIC AND ANAEROBIC 5ML  Final   Culture NO GROWTH 1 DAY  Final   Report Status PENDING  Incomplete  Urine culture     Status: None   Collection Time: 01/22/16  4:25 AM  Result Value Ref Range Status   Specimen Description URINE, CLEAN CATCH  Final   Special Requests NONE  Final   Culture NO GROWTH  Final   Report Status 01/23/2016 FINAL  Final      Radiology Studies: No results found.   Medications:  Scheduled: . enoxaparin (LOVENOX) injection  40 mg Subcutaneous Q24H  . piperacillin-tazobactam (ZOSYN)  IV  3.375 g Intravenous Q8H  . vancomycin  1,000 mg Intravenous Q8H   Continuous: . sodium chloride 75 mL/hr at 01/23/16 2159   PRN:acetaminophen **OR** acetaminophen, HYDROmorphone (DILAUDID) injection, ondansetron (ZOFRAN) IV, oxyCODONE-acetaminophen, promethazine  Assessment/Plan:  Active Problems:   Cellulitis of forearm, right   Cellulitis   Leukocytosis   Neuropathy (HCC)   Nephrolithiasis   Cellulitis of right upper extremity    Recurrent Cellulitis of the right upper extremity  Etiology is unclear. Patient's rheumatoid factor was negative. HIV is negative. ESR was only 18. CRP 6.4. His presentation does not appear to be consistent with inflammatory arthritis. Uric acid was checked back in March and it was 6.1. Patient tells me he has been seen by orthopedics as well as rheumatology. Due to swelling involving his hand as well as right upper arm. MRI has been ordered and is pending. Continue vancomycin and Zosyn for now. Pain control.   Neuropathy present in R fingertips ever since carpel tunnel. At baseline  Nephrolithiasis Multiple admissions for this in past. UA nml. Asymptomatic  Leukocytosis Suspect from #1. WBC 13.5 w/ L shift. Lactic acid 1.17.  WBC has improved  ADDENDUM MRI report reviewed and discussed with patient. Discussed also with Dr. Graves with Ortho who will see the patient. Continue antibiotics.  DVT Prophylaxis: Lovenox    Code Status: Full code  Family Communication: Discussed with the patient  Disposition Plan: Await improvement in symptoms. Await MRI. Mobilize as tolerated.    LOS: 2 days   Jacob Nixon,Jacob Nixon  [facility] Pager [phone] 01/24/2016, 11:13 AM  If 7PM-7AM, please contact night-coverage at [email or web address], password [email or web address]   

## 2016-01-24 NOTE — Progress Notes (Signed)
Per radiology, pt only able to complete hand MRI without contrast. Prior to receiving contrast, pt became very nauseated. Pt to complete MRI with contrast tomorrow.

## 2016-01-25 DIAGNOSIS — L03113 Cellulitis of right upper limb: Principal | ICD-10-CM

## 2016-01-25 DIAGNOSIS — B9689 Other specified bacterial agents as the cause of diseases classified elsewhere: Secondary | ICD-10-CM

## 2016-01-25 LAB — SEDIMENTATION RATE: Sed Rate: 44 mm/hr — ABNORMAL HIGH (ref 0–16)

## 2016-01-25 LAB — BASIC METABOLIC PANEL
Anion gap: 8 (ref 5–15)
CALCIUM: 8.7 mg/dL — AB (ref 8.9–10.3)
CO2: 26 mmol/L (ref 22–32)
CREATININE: 1.06 mg/dL (ref 0.61–1.24)
Chloride: 103 mmol/L (ref 101–111)
GFR calc Af Amer: >60 mL/min (ref >60)
GLUCOSE: 113 mg/dL — AB (ref 65–99)
POTASSIUM: 3.9 mmol/L (ref 3.5–5.1)
SODIUM: 137 mmol/L (ref 135–145)

## 2016-01-25 LAB — CBC
HCT: 38.7 % — ABNORMAL LOW (ref 39.0–52.0)
Hemoglobin: 12.7 g/dL — ABNORMAL LOW (ref 13.0–17.0)
MCH: 29.3 pg (ref 26.0–34.0)
MCHC: 32.8 g/dL (ref 30.0–36.0)
MCV: 89.2 fL (ref 78.0–100.0)
PLATELETS: 305 10*3/uL (ref 150–400)
RBC: 4.34 MIL/uL (ref 4.22–5.81)
RDW: 13.7 % (ref 11.5–15.5)
WBC: 9.3 10*3/uL (ref 4.0–10.5)

## 2016-01-25 LAB — CK: Total CK: 41 U/L — ABNORMAL LOW (ref 49–397)

## 2016-01-25 LAB — TSH: TSH: 2.324 u[IU]/mL (ref 0.350–4.500)

## 2016-01-25 LAB — C-REACTIVE PROTEIN: CRP: 4.7 mg/dL — AB (ref <1.0)

## 2016-01-25 MED ORDER — DEXTROSE 5 % IV SOLN
2.0000 g | Freq: Three times a day (TID) | INTRAVENOUS | Status: DC
  Administered 2016-01-25 – 2016-01-29 (×12): 2 g via INTRAVENOUS
  Filled 2016-01-25 (×18): qty 2

## 2016-01-25 NOTE — Plan of Care (Signed)
Problem: Safety: Goal: Ability to remain free from injury will improve Outcome: Progressing No ijury noted this shift, safety precautions maintained  Problem: Pain Managment: Goal: General experience of comfort will improve Outcome: Progressing Medicated three times for pain with moderate relief  Problem: Bowel/Gastric: Goal: Will not experience complications related to bowel motility Outcome: Progressing No bowel issues reported

## 2016-01-25 NOTE — Progress Notes (Signed)
Subjective: The patient continues to have upper extremity pain today.  He is somewhat frustrated because he feels that his pain continues to be significant and that other admissions it went away much quicker.   Objective: Vital signs in last 24 hours: Temp:  [98.1 F (36.7 C)-98.7 F (37.1 C)] 98.2 F (36.8 C) (09/07 0537) Pulse Rate:  [61-72] 62 (09/07 0537) Resp:  [17-20] 18 (09/07 0537) BP: (101-125)/(52-87) 109/64 (09/07 0537) SpO2:  [100 %] 100 % (09/07 0537)  Intake/Output from previous day: 09/06 0701 - 09/07 0700 In: 1720 [P.O.:720; I.V.:750; IV Piggyback:250] Out: -  Intake/Output this shift: No intake/output data recorded.   Recent Labs  01/23/16 0451 01/24/16 0540 01/25/16 0354  HGB 13.6 13.0 12.7*    Recent Labs  01/24/16 0540 01/25/16 0354  WBC 9.9 9.3  RBC 4.36 4.34  HCT 38.6* 38.7*  PLT 311 305    Recent Labs  01/24/16 0540 01/25/16 0354  NA 136 137  K 4.0 3.9  CL 101 103  CO2 23 26  BUN <5* <5*  CREATININE 0.86 1.06  GLUCOSE 105* 113*  CALCIUM 8.6* 8.7*   No results for input(s): LABPT, INR in the last 72 hours.  Neurologically intact Neurovascular intact Sensation intact distally Intact pulses distally Compartment soft moderate soft tissue swelling over the upper arm.  Mild tenderness palpation over the upper arm.  Assessment/Plan: Complex 25 year old patient with unknown path of anatomy.  I have reviewed his MRIs at length with the radiologist's and do not feel that he has a current surgical indication.  I feel that he is best treated with antibiotics and wonder if steroid management may be appropriate.  I will continue to follow patient while in the hospital but ultimately he may be best served by seeing an upper extremity(Hand) specialist upon discharge as he is seen several of those in the past.  He did have a carpal tunnel release and that may be the best position for him to see in follow-up.  I obviously would be happy to see him  in follow-up but ultimately he may be better served by an upper extremity specialist.   Filicia Scogin L 01/25/2016, 8:21 AM

## 2016-01-25 NOTE — Consult Note (Signed)
[name]  Date of Admission:  01/21/2016  Date of Consult:  01/25/2016  Reason for Consult: Cellulitis, recurrent Referring Physician: [place] Pink  Impression/Recommendation Recurrent Cellulitis Would change antibiotics to vanco/cefepime (with consideration to taper to ancef depending on his progress)  HIV-  Comment- Would query if this is "reflex sympathetic dystrophy" from his previous surgery.  As an infectious etiology, it is possible he could have gotten an infection from his tattoo (staph, strep, rarely mycobacteria) however he should have responded to the antibiotics (except perhaps mycobacteria). If primary wishes to give steroids, I have no objection.   Thank you so much for this interesting consult,   Bobby Rumpf (pager) [phone] [email or web address]  Jacob Nixon. is an 25 y.o. male.  HPI: 25 yo M with hx of R carpal tunnel surgery 2014, with resultant neuropathy and recurrent erythema of his hand. He has had previous eval by ortho and rheum (w/u neagtive for SLE, Rheumatoid, TB).  He returns on 9-4 with 2 days of swelling of his R hand. He was started on IV clindamycin. He was afebrile (Tmax 100), his WBC was 13.5. His anbx were changed to vanco/zosyn on 9-5 after no improvement (continued pain).  He had MRI hand on 9-6 showing: Diffuse cellulitis but no findings for focal soft tissue abscess. Carpal tunnel and flexor tendon tenosynovitis. No findings for septic arthritis or osteomyelitis. He also had MRI of humerus showing: 1. Severe edema surrounding the biceps muscle with fascial enhancement most concerning for fasciitis secondary to an infectious or less likely inflammatory etiology. Mild soft tissue edema and subcutaneous fat of the lateral humerus with enhancement concerning for cellulitis. 2. Synovitis within the proximal long head of the biceps tendon sheath. 3. Small fluid collection anterior to the long head  of the biceps tendon sheath with peripheral enhancement measuring 18 x 8 mm with surrounding edema. Small fluid collection emanating from this collection extending into the subscapularis muscle. The is a concerning for small abscesses. 4. Muscle edema within the lateral deltoid muscle along the fascia concerning for mild myositis.   We are asked to see now due to persistent pain, swelling.   His hx is also notable for new tattoo on his R forearm 2 weeks pta.   CRP 4.7 (9-7) 6.4 (9-4) ESR  18 (9-4)  Past Medical History:  Diagnosis Date  . Bronchitis   . Cellulitis of right hand - RECURRENT   . Kidney stones   . Kidney stones     Past Surgical History:  Procedure Laterality Date  . HAND SURGERY    . KIDNEY STONE SURGERY     lithortripsy     No Known Allergies  Medications:  Scheduled: . enoxaparin (LOVENOX) injection  40 mg Subcutaneous Q24H  . piperacillin-tazobactam (ZOSYN)  IV  3.375 g Intravenous Q8H  . vancomycin  1,000 mg Intravenous Q8H    Abtx:  Anti-infectives    Start     Dose/Rate Route Frequency Ordered Stop   01/23/16 1800  vancomycin (VANCOCIN) IVPB 1000 mg/200 mL premix     1,000 mg 200 mL/hr over 60 Minutes Intravenous Every 8 hours 01/23/16 0857     01/23/16 1600  piperacillin-tazobactam (ZOSYN) IVPB 3.375 g     3.375 g 12.5 mL/hr over 240 Minutes Intravenous Every 8 hours 01/23/16 0857     01/23/16 0900  piperacillin-tazobactam (ZOSYN) IVPB 3.375 g     3.375 g 100 mL/hr over 30 Minutes Intravenous  Once 01/23/16  0845 01/23/16 1032   01/23/16 0900  vancomycin (VANCOCIN) IVPB 1000 mg/200 mL premix  Status:  Discontinued     1,000 mg 200 mL/hr over 60 Minutes Intravenous  Once 01/23/16 0845 01/23/16 0850   01/23/16 0900  vancomycin (VANCOCIN) 2,000 mg in sodium chloride 0.9 % 500 mL IVPB     2,000 mg 250 mL/hr over 120 Minutes Intravenous  Once 01/23/16 0850 01/23/16 1123   01/22/16 1400  clindamycin (CLEOCIN) IVPB 600 mg  Status:  Discontinued      600 mg 100 mL/hr over 30 Minutes Intravenous Every 8 hours 01/22/16 1043 01/23/16 0845   01/22/16 0600  clindamycin (CLEOCIN) IVPB 600 mg  Status:  Discontinued     600 mg 100 mL/hr over 30 Minutes Intravenous Every 8 hours 01/22/16 0542 01/22/16 1043   01/22/16 0030  clindamycin (CLEOCIN) IVPB 900 mg     900 mg 100 mL/hr over 30 Minutes Intravenous  Once 01/22/16 0017 01/22/16 0409      Total days of antibiotics: 3 vanco/zosyn          Social History:  reports that he has quit smoking. He has never used smokeless tobacco. He reports that he does not drink alcohol or use drugs.  Family History  Problem Relation Age of Onset  . Diabetes Other   . Hypertension Other     General ROS: denies recent illness, normal BM PTA (none in hospital), normal urination, no cough, no sob, see HPI.   Blood pressure 109/64, pulse 62, temperature 98.2 F (36.8 C), temperature source Oral, resp. rate 18, height '5\' 11"'  (1.803 m), weight 114.3 kg (252 lb), SpO2 100 %. General appearance: alert, cooperative and no distress Eyes: negative findings: conjunctivae and sclerae normal and pupils equal, round, reactive to light and accomodation Throat: normal findings: oropharynx pink & moist without lesions or evidence of thrush and abnormal findings: dentition: poor Neck: no adenopathy and supple, symmetrical, trachea midline Lungs: clear to auscultation bilaterally Heart: regular rate and rhythm Abdomen: normal findings: bowel sounds normal and soft, non-tender Extremities: swelling and erythema at distal biceps on R. swelling in forearm as well. diffuse tenderness. mild heat. tattooed skin is well healed.    Results for orders placed or performed during the hospital encounter of 01/21/16 (from the past 48 hour(s))  CBC     Status: Abnormal   Collection Time: 01/24/16  5:40 AM  Result Value Ref Range   WBC 9.9 4.0 - 10.5 K/uL   RBC 4.36 4.22 - 5.81 MIL/uL   Hemoglobin 13.0 13.0 - 17.0 g/dL   HCT  38.6 (L) 39.0 - 52.0 %   MCV 88.5 78.0 - 100.0 fL   MCH 29.8 26.0 - 34.0 pg   MCHC 33.7 30.0 - 36.0 g/dL   RDW 13.6 11.5 - 15.5 %   Platelets 311 150 - 400 K/uL  Basic metabolic panel     Status: Abnormal   Collection Time: 01/24/16  5:40 AM  Result Value Ref Range   Sodium 136 135 - 145 mmol/L   Potassium 4.0 3.5 - 5.1 mmol/L   Chloride 101 101 - 111 mmol/L   CO2 23 22 - 32 mmol/L   Glucose, Bld 105 (H) 65 - 99 mg/dL   BUN <5 (L) 6 - 20 mg/dL   Creatinine, Ser 0.86 0.61 - 1.24 mg/dL   Calcium 8.6 (L) 8.9 - 10.3 mg/dL   GFR calc non Af Amer >60 >60 mL/min   GFR calc Af Amer >60 >  60 mL/min    Comment: (NOTE) The eGFR has been calculated using the CKD EPI equation. This calculation has not been validated in all clinical situations. eGFR's persistently <60 mL/min signify possible Chronic Kidney Disease.    Anion gap 12 5 - 15  CBC     Status: Abnormal   Collection Time: 01/25/16  3:54 AM  Result Value Ref Range   WBC 9.3 4.0 - 10.5 K/uL   RBC 4.34 4.22 - 5.81 MIL/uL   Hemoglobin 12.7 (L) 13.0 - 17.0 g/dL   HCT 38.7 (L) 39.0 - 52.0 %   MCV 89.2 78.0 - 100.0 fL   MCH 29.3 26.0 - 34.0 pg   MCHC 32.8 30.0 - 36.0 g/dL   RDW 13.7 11.5 - 15.5 %   Platelets 305 150 - 400 K/uL  Basic metabolic panel     Status: Abnormal   Collection Time: 01/25/16  3:54 AM  Result Value Ref Range   Sodium 137 135 - 145 mmol/L   Potassium 3.9 3.5 - 5.1 mmol/L   Chloride 103 101 - 111 mmol/L   CO2 26 22 - 32 mmol/L   Glucose, Bld 113 (H) 65 - 99 mg/dL   BUN <5 (L) 6 - 20 mg/dL   Creatinine, Ser 1.06 0.61 - 1.24 mg/dL   Calcium 8.7 (L) 8.9 - 10.3 mg/dL   GFR calc non Af Amer >60 >60 mL/min   GFR calc Af Amer >60 >60 mL/min    Comment: (NOTE) The eGFR has been calculated using the CKD EPI equation. This calculation has not been validated in all clinical situations. eGFR's persistently <60 mL/min signify possible Chronic Kidney Disease.    Anion gap 8 5 - 15  CK     Status: Abnormal    Collection Time: 01/25/16 11:38 AM  Result Value Ref Range   Total CK 41 (L) 49 - 397 U/L      Component Value Date/Time   SDES URINE, CLEAN CATCH 01/22/2016 0425   SPECREQUEST NONE 01/22/2016 0425   CULT NO GROWTH 01/22/2016 0425   REPTSTATUS 01/23/2016 FINAL 01/22/2016 0425   Mr Humerus Right W Wo Contrast  Result Date: 01/24/2016 CLINICAL DATA:  Right wrist carpal tunnel surgery. Recurrent episodes of cellulitis of the right upper extremity. EXAM: MRI OF THE RIGHT HUMERUS WITHOUT AND WITH CONTRAST TECHNIQUE: Multiplanar, multisequence MR imaging was performed both before and after administration of intravenous contrast. CONTRAST:  23m MULTIHANCE GADOBENATE DIMEGLUMINE 529 MG/ML IV SOLN COMPARISON:  None. FINDINGS: Bones/Joint/Cartilage No marrow signal abnormality. No fracture or dislocation. No aggressive osseous lesion. Normal alignment. No joint effusion. Muscles and Tendons Muscle edema within the lateral deltoid muscle along the fascia concerning for mild myositis. Severe edema surrounding the biceps muscle with fascial enhancement most concerning for fasciitis. Mild soft tissue edema and subcutaneous fat of the lateral humerus with enhancement concerning for cellulitis. Synovitis within the long head of the proximal biceps tendon sheath. Small fluid collection anterior to the long head of the biceps tendon sheath with peripheral enhancement measuring 18 x 8 mm with surrounding edema. Small fluid collection emanating from this collection extending into the subscapularis muscle. Rotator cuff is suboptimally evaluated secondary to large field of view. There is likely tendinosis with a small partial tear of the supraspinatus tendon. Soft tissue No soft tissue mass.  No other fluid collection or hematoma. IMPRESSION: 1. Severe edema surrounding the biceps muscle with fascial enhancement most concerning for fasciitis secondary to an infectious or less likely inflammatory  etiology. Mild soft tissue  edema and subcutaneous fat of the lateral humerus with enhancement concerning for cellulitis. 2. Synovitis within the proximal long head of the biceps tendon sheath. 3. Small fluid collection anterior to the long head of the biceps tendon sheath with peripheral enhancement measuring 18 x 8 mm with surrounding edema. Small fluid collection emanating from this collection extending into the subscapularis muscle. The is a concerning for small abscesses. 4. Muscle edema within the lateral deltoid muscle along the fascia concerning for mild myositis. Electronically Signed   By: Kathreen Devoid   On: 01/24/2016 14:31   Mr Hand Right W Wo Contrast  Result Date: 01/24/2016 CLINICAL DATA:  Recurrent cellulitis. Pain and swelling in the right hand and arm. EXAM: MRI OF THE RIGHT HAND WITHOUT AND WITH CONTRAST TECHNIQUE: Multiplanar, multisequence MR imaging was performed both before and after administration of intravenous contrast. CONTRAST:  20 cc MultiHance COMPARISON:  07/30/2015 FINDINGS: Mild subcutaneous soft tissue swelling/ edema/fluid surrounding the fingers and along the dorsal and palmar aspects of the hand. There is also extensive fluid in the carpal tunnel and extending up into the hand as tenosynovitis. No rim enhancing soft tissue abscess is identified. No findings to suggest septic arthritis or osteomyelitis. No findings for pyomyositis. Stable scattered carpal cysts. IMPRESSION: Diffuse cellulitis but no findings for focal soft tissue abscess. Carpal tunnel and flexor tendon tenosynovitis. No findings for septic arthritis or osteomyelitis. Electronically Signed   By: Marijo Sanes M.D.   On: 01/24/2016 15:58   Recent Results (from the past 240 hour(s))  Culture, blood (Routine x 2)     Status: None (Preliminary result)   Collection Time: 01/21/16 10:45 PM  Result Value Ref Range Status   Specimen Description BLOOD LEFT HAND  Final   Special Requests IN PEDIATRIC BOTTLE 4ML  Final   Culture NO GROWTH 3  DAYS  Final   Report Status PENDING  Incomplete  Culture, blood (Routine x 2)     Status: None (Preliminary result)   Collection Time: 01/22/16  1:27 AM  Result Value Ref Range Status   Specimen Description BLOOD LEFT ARM  Final   Special Requests BOTTLES DRAWN AEROBIC AND ANAEROBIC 5ML  Final   Culture NO GROWTH 2 DAYS  Final   Report Status PENDING  Incomplete  Urine culture     Status: None   Collection Time: 01/22/16  4:25 AM  Result Value Ref Range Status   Specimen Description URINE, CLEAN CATCH  Final   Special Requests NONE  Final   Culture NO GROWTH  Final   Report Status 01/23/2016 FINAL  Final      01/25/2016, 12:40 PM     LOS: 3 days    Records and images were personally reviewed where available.

## 2016-01-25 NOTE — Progress Notes (Signed)
[facility] PROGRESS NOTE  Jacob Nixon. ZOX:[id number] DOB: Dec 05, 1990 DOA: 01/21/2016  PCP: No PCP Per Patient  Brief HPI: This is a 25 year old African-American male with a past medical history of right wrist carpal tunnel surgery in the past with recurrent episodes of cellulitis and pain in the right upper extremity. His been hospitalized for several similar episodes in the past. He came in with pain and swelling in his right hand which migrated up to his right upper arm. Patient was hospitalized for further evaluation.  Past medical history:  Past Medical History:  Diagnosis Date  . Bronchitis   . Cellulitis of right hand - RECURRENT   . Kidney stones   . Kidney stones     Consultants: Orthopedics. Infectious disease.  Procedures: None  Antibiotics: Vancomycin and Zosyn  Subjective: Patient does not report any improvement in his symptoms of right upper arm pain. He is quite frustrated at the lack of explanation for his symptoms. He denies any other symptoms such as nausea, vomiting or shortness of breath.   Objective:  Vital Signs  Vitals:   01/24/16 0527 01/24/16 1500 01/24/16 2027 01/25/16 0537  BP: 119/70 (!) 101/52 125/87 109/64  Pulse: 78 61 72 62  Resp: '20 17 20 18  ' Temp: 97.7 F (36.5 C) 98.7 F (37.1 C) 98.1 F (36.7 C) 98.2 F (36.8 C)  TempSrc: Oral Oral Oral Oral  SpO2: 100% 100% 100% 100%  Weight:      Height:        Intake/Output Summary (Last 24 hours) at 01/25/16 8119 Last data filed at 01/24/16 1478  Gross per 24 hour  Intake             1720 ml  Output                0 ml  Net             1720 ml   Filed Weights   01/21/16 2233 01/22/16 0547  Weight: 114.3 kg (252 lb) 114.3 kg (252 lb)    General appearance: alert, cooperative, appears stated age and no distress Resp: clear to auscultation bilaterally Cardio: regular rate and rhythm, S1, S2 normal, no murmur, click, rub or gallop GI: soft, non-tender; bowel sounds  normal; no masses,  no organomegaly Extremities: Patient's right upper extremity is covered in a splint. Still appears to have swelling.  Neurologic: Patient is awake and alert. Oriented 3. No focal neurological deficits are noted.  Lab Results:  Data Reviewed: I have personally reviewed following labs and imaging studies  CBC:  Recent Labs Lab 01/21/16 2245 01/23/16 0451 01/24/16 0540 01/25/16 0354  WBC 13.5* 10.8* 9.9 9.3  NEUTROABS 9.5*  --   --   --   HGB 14.7 13.6 13.0 12.7*  HCT 44.1 41.3 38.6* 38.7*  MCV 88.6 89.0 88.5 89.2  PLT 315 290 311 295   Basic Metabolic Panel:  Recent Labs Lab 01/21/16 2245 01/23/16 0451 01/24/16 0540 01/25/16 0354  NA 137 137 136 137  K 3.9 4.0 4.0 3.9  CL 107 106 101 103  CO2 '23 24 23 26  ' GLUCOSE 103* 100* 105* 113*  BUN 10 6 <5* <5*  CREATININE 0.93 0.88 0.86 1.06  CALCIUM 9.2 8.6* 8.6* 8.7*   GFR: Estimated Creatinine Clearance: 135.8 mL/min (by C-G formula based on SCr of 1.06 mg/dL).  Liver Function Tests:  Recent Labs Lab 01/21/16 2245  AST 23  ALT 20  ALKPHOS 71  BILITOT 0.8  PROT 7.6  ALBUMIN 3.6   Urine analysis:    Component Value Date/Time   COLORURINE YELLOW 01/22/2016 0425   APPEARANCEUR CLOUDY (A) 01/22/2016 0425   LABSPEC 1.025 01/22/2016 0425   PHURINE 6.0 01/22/2016 0425   GLUCOSEU NEGATIVE 01/22/2016 0425   HGBUR NEGATIVE 01/22/2016 0425   BILIRUBINUR NEGATIVE 01/22/2016 0425   KETONESUR NEGATIVE 01/22/2016 0425   PROTEINUR NEGATIVE 01/22/2016 0425   UROBILINOGEN 1.0 11/28/2014 1026   NITRITE NEGATIVE 01/22/2016 0425   LEUKOCYTESUR NEGATIVE 01/22/2016 0425    Recent Results (from the past 240 hour(s))  Culture, blood (Routine x 2)     Status: None (Preliminary result)   Collection Time: 01/21/16 10:45 PM  Result Value Ref Range Status   Specimen Description BLOOD LEFT HAND  Final   Special Requests IN PEDIATRIC BOTTLE 4ML  Final   Culture NO GROWTH 3 DAYS  Final   Report Status PENDING   Incomplete  Culture, blood (Routine x 2)     Status: None (Preliminary result)   Collection Time: 01/22/16  1:27 AM  Result Value Ref Range Status   Specimen Description BLOOD LEFT ARM  Final   Special Requests BOTTLES DRAWN AEROBIC AND ANAEROBIC 5ML  Final   Culture NO GROWTH 2 DAYS  Final   Report Status PENDING  Incomplete  Urine culture     Status: None   Collection Time: 01/22/16  4:25 AM  Result Value Ref Range Status   Specimen Description URINE, CLEAN CATCH  Final   Special Requests NONE  Final   Culture NO GROWTH  Final   Report Status 01/23/2016 FINAL  Final      Radiology Studies: Mr Humerus Right W Wo Contrast  Result Date: 01/24/2016 CLINICAL DATA:  Right wrist carpal tunnel surgery. Recurrent episodes of cellulitis of the right upper extremity. EXAM: MRI OF THE RIGHT HUMERUS WITHOUT AND WITH CONTRAST TECHNIQUE: Multiplanar, multisequence MR imaging was performed both before and after administration of intravenous contrast. CONTRAST:  82m MULTIHANCE GADOBENATE DIMEGLUMINE 529 MG/ML IV SOLN COMPARISON:  None. FINDINGS: Bones/Joint/Cartilage No marrow signal abnormality. No fracture or dislocation. No aggressive osseous lesion. Normal alignment. No joint effusion. Muscles and Tendons Muscle edema within the lateral deltoid muscle along the fascia concerning for mild myositis. Severe edema surrounding the biceps muscle with fascial enhancement most concerning for fasciitis. Mild soft tissue edema and subcutaneous fat of the lateral humerus with enhancement concerning for cellulitis. Synovitis within the long head of the proximal biceps tendon sheath. Small fluid collection anterior to the long head of the biceps tendon sheath with peripheral enhancement measuring 18 x 8 mm with surrounding edema. Small fluid collection emanating from this collection extending into the subscapularis muscle. Rotator cuff is suboptimally evaluated secondary to large field of view. There is likely  tendinosis with a small partial tear of the supraspinatus tendon. Soft tissue No soft tissue mass.  No other fluid collection or hematoma. IMPRESSION: 1. Severe edema surrounding the biceps muscle with fascial enhancement most concerning for fasciitis secondary to an infectious or less likely inflammatory etiology. Mild soft tissue edema and subcutaneous fat of the lateral humerus with enhancement concerning for cellulitis. 2. Synovitis within the proximal long head of the biceps tendon sheath. 3. Small fluid collection anterior to the long head of the biceps tendon sheath with peripheral enhancement measuring 18 x 8 mm with surrounding edema. Small fluid collection emanating from this collection extending into the subscapularis muscle. The is a concerning for small  abscesses. 4. Muscle edema within the lateral deltoid muscle along the fascia concerning for mild myositis. Electronically Signed   By: Kathreen Devoid   On: 01/24/2016 14:31   Mr Hand Right W Wo Contrast  Result Date: 01/24/2016 CLINICAL DATA:  Recurrent cellulitis. Pain and swelling in the right hand and arm. EXAM: MRI OF THE RIGHT HAND WITHOUT AND WITH CONTRAST TECHNIQUE: Multiplanar, multisequence MR imaging was performed both before and after administration of intravenous contrast. CONTRAST:  20 cc MultiHance COMPARISON:  07/30/2015 FINDINGS: Mild subcutaneous soft tissue swelling/ edema/fluid surrounding the fingers and along the dorsal and palmar aspects of the hand. There is also extensive fluid in the carpal tunnel and extending up into the hand as tenosynovitis. No rim enhancing soft tissue abscess is identified. No findings to suggest septic arthritis or osteomyelitis. No findings for pyomyositis. Stable scattered carpal cysts. IMPRESSION: Diffuse cellulitis but no findings for focal soft tissue abscess. Carpal tunnel and flexor tendon tenosynovitis. No findings for septic arthritis or osteomyelitis. Electronically Signed   By: Marijo Sanes  M.D.   On: 01/24/2016 15:58     Medications:  Scheduled: . enoxaparin (LOVENOX) injection  40 mg Subcutaneous Q24H  . piperacillin-tazobactam (ZOSYN)  IV  3.375 g Intravenous Q8H  . vancomycin  1,000 mg Intravenous Q8H   Continuous: . sodium chloride 75 mL/hr at 01/25/16 0159   LHT:[id number] **OR** acetaminophen, HYDROmorphone (DILAUDID) injection, ondansetron (ZOFRAN) IV, oxyCODONE-acetaminophen, promethazine  Assessment/Plan:  Active Problems:   Cellulitis of forearm, right   Cellulitis   Leukocytosis   Neuropathy (HCC)   Nephrolithiasis   Cellulitis of right upper extremity    Recurrent Cellulitis of the right upper extremity  Etiology is unclear. MRI of the right upper arm as well as the hand were done yesterday. Reports reviewed. Consulted Dr. Berenice Primas with orthopedics. Please see his notes for details. It is not clear the patient truly has an infectious etiology for his presentation. We will repeat ESR and CRP. Will also obtain CK, aldolase, TSH. Discussed with Dr. Johnnye Sima with ID and requested that he provide his input as well. Patient's rheumatoid factor was negative. HIV is negative. ESR was only 18. CRP 6.4. Uric acid was checked back in March and it was 6.1. Patient tells me he has been seen by orthopedics as well as rheumatology. Continue vancomycin and Zosyn for now. Pain control.   Neuropathy Present in R fingertips ever since carpel tunnel. At baseline  Nephrolithiasis Multiple admissions for this in past. Asymptomatic  Leukocytosis Suspect from #1. WBC 13.5 w/ L shift. Lactic acid 1.17. WBC has improved  DVT Prophylaxis: Lovenox    Code Status: Full code  Family Communication: Discussed with the patient  Disposition Plan: Await improvement in symptoms. Await specialty input.    LOS: 3 days   [facility] Pager [phone] 01/25/2016, 8:12 AM  If 7PM-7AM, please contact night-coverage at [email or web address], password [facility]

## 2016-01-26 ENCOUNTER — Inpatient Hospital Stay (HOSPITAL_COMMUNITY): Payer: Self-pay

## 2016-01-26 DIAGNOSIS — M791 Myalgia: Secondary | ICD-10-CM

## 2016-01-26 DIAGNOSIS — L02419 Cutaneous abscess of limb, unspecified: Secondary | ICD-10-CM

## 2016-01-26 LAB — BASIC METABOLIC PANEL
ANION GAP: 6 (ref 5–15)
BUN: 6 mg/dL (ref 6–20)
CO2: 27 mmol/L (ref 22–32)
Calcium: 8.9 mg/dL (ref 8.9–10.3)
Chloride: 106 mmol/L (ref 101–111)
Creatinine, Ser: 0.95 mg/dL (ref 0.61–1.24)
Glucose, Bld: 90 mg/dL (ref 65–99)
POTASSIUM: 3.9 mmol/L (ref 3.5–5.1)
SODIUM: 139 mmol/L (ref 135–145)

## 2016-01-26 LAB — CULTURE, BLOOD (ROUTINE X 2): Culture: NO GROWTH

## 2016-01-26 LAB — CBC
HCT: 42.2 % (ref 39.0–52.0)
Hemoglobin: 13.7 g/dL (ref 13.0–17.0)
MCH: 29 pg (ref 26.0–34.0)
MCHC: 32.5 g/dL (ref 30.0–36.0)
MCV: 89.2 fL (ref 78.0–100.0)
PLATELETS: 344 10*3/uL (ref 150–400)
RBC: 4.73 MIL/uL (ref 4.22–5.81)
RDW: 13.4 % (ref 11.5–15.5)
WBC: 8.3 10*3/uL (ref 4.0–10.5)

## 2016-01-26 LAB — ALDOLASE: ALDOLASE: 5.7 U/L (ref 3.3–10.3)

## 2016-01-26 LAB — RHEUMATOID FACTOR: Rhuematoid fact SerPl-aCnc: <10 IU/mL (ref 0.0–13.9)

## 2016-01-26 LAB — MPO/PR-3 (ANCA) ANTIBODIES: ANCA Proteinase 3: <3.5 U/mL (ref 0.0–3.5)

## 2016-01-26 LAB — ANTINUCLEAR ANTIBODIES, IFA: ANA Ab, IFA: NEGATIVE

## 2016-01-26 LAB — ANCA TITERS: C-ANCA: <1:20 {titer}

## 2016-01-26 MED ORDER — DOCUSATE SODIUM 100 MG PO CAPS
100.0000 mg | ORAL_CAPSULE | Freq: Two times a day (BID) | ORAL | Status: DC
  Administered 2016-01-26 – 2016-01-29 (×7): 100 mg via ORAL
  Filled 2016-01-26 (×7): qty 1

## 2016-01-26 MED ORDER — LIDOCAINE HCL 1 % IJ SOLN
INTRAMUSCULAR | Status: AC
  Filled 2016-01-26: qty 20

## 2016-01-26 MED ORDER — POLYETHYLENE GLYCOL 3350 17 G PO PACK: 17.0000 g | PACK | Freq: Every day | ORAL | Status: DC | PRN

## 2016-01-26 MED ORDER — PREDNISONE 20 MG PO TABS
60.0000 mg | ORAL_TABLET | Freq: Two times a day (BID) | ORAL | Status: DC
  Administered 2016-01-26 – 2016-01-29 (×6): 60 mg via ORAL
  Filled 2016-01-26 (×7): qty 3

## 2016-01-26 NOTE — Procedures (Signed)
Interventional Radiology Procedure Note  Procedure: US aspiration of fluid at the proximal, volar upper arm, superficial to the muscle, deep to the dermal layer.   Findings:  Very viscous fluid, with less than 1cc of fluid aspirated with short 18g needle.  Fluid sent for culture/gram stain.    Complications: None  Recommendations:  - Follow up culture - Routine line care   Signed,  Yvone NeuJaime S. Loreta AveWagner, DO

## 2016-01-26 NOTE — Progress Notes (Signed)
INFECTIOUS DISEASE PROGRESS NOTE  ID: Jacob SheffieldAdrian Lanell Dunkley Jr. is a 25 y.o. male with  Active Problems:   Cellulitis of forearm, right   Cellulitis   Leukocytosis   Neuropathy (HCC)   Nephrolithiasis   Cellulitis of right upper extremity  Subjective: Cont muscle pain and swelling  Abtx:  Anti-infectives    Start     Dose/Rate Route Frequency Ordered Stop   01/25/16 1400  ceFEPIme (MAXIPIME) 2 g in dextrose 5 % 50 mL IVPB     2 g 100 mL/hr over 30 Minutes Intravenous Every 8 hours 01/25/16 1317     01/23/16 1800  vancomycin (VANCOCIN) IVPB 1000 mg/200 mL premix     1,000 mg 200 mL/hr over 60 Minutes Intravenous Every 8 hours 01/23/16 0857     01/23/16 1600  piperacillin-tazobactam (ZOSYN) IVPB 3.375 g  Status:  Discontinued     3.375 g 12.5 mL/hr over 240 Minutes Intravenous Every 8 hours 01/23/16 0857 01/25/16 1317   01/23/16 0900  piperacillin-tazobactam (ZOSYN) IVPB 3.375 g     3.375 g 100 mL/hr over 30 Minutes Intravenous  Once 01/23/16 0845 01/23/16 1032   01/23/16 0900  vancomycin (VANCOCIN) IVPB 1000 mg/200 mL premix  Status:  Discontinued     1,000 mg 200 mL/hr over 60 Minutes Intravenous  Once 01/23/16 0845 01/23/16 0850   01/23/16 0900  vancomycin (VANCOCIN) 2,000 mg in sodium chloride 0.9 % 500 mL IVPB     2,000 mg 250 mL/hr over 120 Minutes Intravenous  Once 01/23/16 0850 01/23/16 1123   01/22/16 1400  clindamycin (CLEOCIN) IVPB 600 mg  Status:  Discontinued     600 mg 100 mL/hr over 30 Minutes Intravenous Every 8 hours 01/22/16 1043 01/23/16 0845   01/22/16 0600  clindamycin (CLEOCIN) IVPB 600 mg  Status:  Discontinued     600 mg 100 mL/hr over 30 Minutes Intravenous Every 8 hours 01/22/16 0542 01/22/16 1043   01/22/16 0030  clindamycin (CLEOCIN) IVPB 900 mg     900 mg 100 mL/hr over 30 Minutes Intravenous  Once 01/22/16 0017 01/22/16 0409      Medications:  Scheduled: . ceFEPime (MAXIPIME) IV  2 g Intravenous Q8H  . docusate sodium  100 mg Oral BID    . enoxaparin (LOVENOX) injection  40 mg Subcutaneous Q24H  . predniSONE  60 mg Oral BID WC  . vancomycin  1,000 mg Intravenous Q8H    Objective: Vital signs in last 24 hours: Temp:  [97.9 F (36.6 C)-100.4 F (38 C)] 97.9 F (36.6 C) (09/08 1403) Pulse Rate:  [68-93] 68 (09/08 1403) Resp:  [16-18] 16 (09/08 1403) BP: (123-135)/(68-82) 123/68 (09/08 1403) SpO2:  [98 %-100 %] 98 % (09/08 1403)   General appearance: alert, cooperative and no distress Extremities: RUE contiuned swelling, tenderness at [facility] fossa.   Lab Results  Recent Labs  01/25/16 0354 01/26/16 0448  WBC 9.3 8.3  HGB 12.7* 13.7  HCT 38.7* 42.2  NA 137 139  K 3.9 3.9  CL 103 106  CO2 26 27  BUN <5* 6  CREATININE 1.06 0.95   Liver Panel No results for input(s): PROT, ALBUMIN, AST, ALT, ALKPHOS, BILITOT, BILIDIR, IBILI in the last 72 hours. Sedimentation Rate  Recent Labs  01/25/16 1138  ESRSEDRATE 44*   C-Reactive Protein  Recent Labs  01/25/16 1138  CRP 4.7*    Microbiology: Recent Results (from the past 240 hour(s))  Culture, blood (Routine x 2)     Status: None  Collection Time: 01/21/16 10:45 PM  Result Value Ref Range Status   Specimen Description BLOOD LEFT HAND  Final   Special Requests IN PEDIATRIC BOTTLE  Final   Culture NO GROWTH 5 DAYS  Final   Report Status 01/26/2016 FINAL  Final  Culture, blood (Routine x 2)     Status: None (Preliminary result)   Collection Time: 01/22/16  1:27 AM  Result Value Ref Range Status   Specimen Description BLOOD LEFT ARM  Final   Special Requests BOTTLES DRAWN AEROBIC AND ANAEROBIC  Final   Culture NO GROWTH 4 DAYS  Final   Report Status PENDING  Incomplete  Urine culture     Status: None   Collection Time: 01/22/16  4:25 AM  Result Value Ref Range Status   Specimen Description URINE, CLEAN CATCH  Final   Special Requests NONE  Final   Culture NO GROWTH  Final   Report Status 01/23/2016 FINAL  Final    Studies/Results: No  results found.   Assessment/Plan: Recurrent Cellulitis   for aspirate of fluid collection- my appreciation to IR Would also consider muscle biopsy- his CK is near normal. This would seem to make myositis less likely. Will check aldolase. SCL70  Total days of antibiotics: 4 vanco/cefepime         Johny Sax Infectious Diseases (pager) [phone] [email or web address] 01/26/2016, 4:02 PM  LOS: 4 days

## 2016-01-26 NOTE — Plan of Care (Signed)
Problem: Education: Goal: Knowledge of [facility] Education information/materials will improve Outcome: Progressing POC reviewed with pt. and about antibiotics.

## 2016-01-26 NOTE — Progress Notes (Signed)
[facility] PROGRESS NOTE  Jacob Nixon. OFH:[id number] DOB: 1991/02/22 DOA: 01/21/2016  PCP: No PCP Per Patient  Brief HPI: This is a 25 year old African-American male with a past medical history of right wrist carpal tunnel surgery in the past with recurrent episodes of cellulitis and pain in the right upper extremity. His been hospitalized for several similar episodes in the past. He came in with pain and swelling in his right hand which migrated up to his right upper arm. Patient was hospitalized for further evaluation.  Past medical history:  Past Medical History:  Diagnosis Date  . Bronchitis   . Cellulitis of right hand - RECURRENT   . Kidney stones   . Kidney stones     Consultants: Orthopedics. Infectious disease.  Procedures: None yet  Antibiotics: Vancomycin  Zosyn was changed to Cefepime  Subjective: Patient is extremely frustrated this morning. He is quite upset that we don't have a clear diagnosis for him. I explained to him that I share his frustration. He states that there is no improvement in his swelling of the right arm. He states that his pain is migrated to the right shoulder today.   Objective:  Vital Signs  Vitals:   01/24/16 2027 01/25/16 0537 01/25/16 1954 01/26/16 0528  BP: 125/87 109/64 131/74 135/82  Pulse: 72 62 93 90  Resp: '20 18 18 18  ' Temp: 98.1 F (36.7 C) 98.2 F (36.8 C) (!) 100.4 F (38 C) 98.7 F (37.1 C)  TempSrc: Oral Oral Oral Oral  SpO2: 100% 100% 100% 100%  Weight:      Height:        Intake/Output Summary (Last 24 hours) at 01/26/16 5498 Last data filed at 01/25/16 1900  Gross per 24 hour  Intake             1100 ml  Output                0 ml  Net             1100 ml   Filed Weights   01/21/16 2233 01/22/16 0547  Weight: 114.3 kg (252 lb) 114.3 kg (252 lb)    General appearance: alert, cooperative, appears stated age and no distress Resp: clear to auscultation bilaterally Cardio: regular rate  and rhythm, S1, S2 normal, no murmur, click, rub or gallop GI: soft, non-tender; bowel sounds normal; no masses,  no organomegaly Extremities: Patient's right upper extremity is covered in a splint. Still appears to have swelling.  Neurologic: Patient is awake and alert. Oriented 3. No focal neurological deficits are noted.  Lab Results:  Data Reviewed: I have personally reviewed following labs and imaging studies  CBC:  Recent Labs Lab 01/21/16 2245 01/23/16 0451 01/24/16 0540 01/25/16 0354 01/26/16 0448  WBC 13.5* 10.8* 9.9 9.3 8.3  NEUTROABS 9.5*  --   --   --   --   HGB 14.7 13.6 13.0 12.7* 13.7  HCT 44.1 41.3 38.6* 38.7* 42.2  MCV 88.6 89.0 88.5 89.2 89.2  PLT 315 290 311 305 264   Basic Metabolic Panel:  Recent Labs Lab 01/21/16 2245 01/23/16 0451 01/24/16 0540 01/25/16 0354 01/26/16 0448  NA 137 137 136 137 139  K 3.9 4.0 4.0 3.9 3.9  CL 107 106 101 103 106  CO2 '23 24 23 26 27  ' GLUCOSE 103* 100* 105* 113* 90  BUN 10 6 <5* <5* 6  CREATININE 0.93 0.88 0.86 1.06 0.95  CALCIUM 9.2 8.6*  8.6* 8.7* 8.9   GFR: Estimated Creatinine Clearance: 151.5 mL/min (by C-G formula based on SCr of 0.95 mg/dL).  Liver Function Tests:  Recent Labs Lab 01/21/16 2245  AST 23  ALT 20  ALKPHOS 71  BILITOT 0.8  PROT 7.6  ALBUMIN 3.6   Urine analysis:    Component Value Date/Time   COLORURINE YELLOW 01/22/2016 0425   APPEARANCEUR CLOUDY (A) 01/22/2016 0425   LABSPEC 1.025 01/22/2016 0425   PHURINE 6.0 01/22/2016 0425   GLUCOSEU NEGATIVE 01/22/2016 0425   HGBUR NEGATIVE 01/22/2016 0425   BILIRUBINUR NEGATIVE 01/22/2016 0425   KETONESUR NEGATIVE 01/22/2016 0425   PROTEINUR NEGATIVE 01/22/2016 0425   UROBILINOGEN 1.0 11/28/2014 1026   NITRITE NEGATIVE 01/22/2016 0425   LEUKOCYTESUR NEGATIVE 01/22/2016 0425    Recent Results (from the past 240 hour(s))  Culture, blood (Routine x 2)     Status: None (Preliminary result)   Collection Time: 01/21/16 10:45 PM  Result  Value Ref Range Status   Specimen Description BLOOD LEFT HAND  Final   Special Requests IN PEDIATRIC BOTTLE 4ML  Final   Culture NO GROWTH 4 DAYS  Final   Report Status PENDING  Incomplete  Culture, blood (Routine x 2)     Status: None (Preliminary result)   Collection Time: 01/22/16  1:27 AM  Result Value Ref Range Status   Specimen Description BLOOD LEFT ARM  Final   Special Requests BOTTLES DRAWN AEROBIC AND ANAEROBIC 5ML  Final   Culture NO GROWTH 3 DAYS  Final   Report Status PENDING  Incomplete  Urine culture     Status: None   Collection Time: 01/22/16  4:25 AM  Result Value Ref Range Status   Specimen Description URINE, CLEAN CATCH  Final   Special Requests NONE  Final   Culture NO GROWTH  Final   Report Status 01/23/2016 FINAL  Final      Radiology Studies: Jacob Humerus Right W Nixon Contrast  Result Date: 01/24/2016 CLINICAL DATA:  Right wrist carpal tunnel surgery. Recurrent episodes of cellulitis of the right upper extremity. EXAM: MRI OF THE RIGHT HUMERUS WITHOUT AND WITH CONTRAST TECHNIQUE: Multiplanar, multisequence Jacob imaging was performed both before and after administration of intravenous contrast. CONTRAST:  33m MULTIHANCE GADOBENATE DIMEGLUMINE 529 MG/ML IV SOLN COMPARISON:  None. FINDINGS: Bones/Joint/Cartilage No marrow signal abnormality. No fracture or dislocation. No aggressive osseous lesion. Normal alignment. No joint effusion. Muscles and Tendons Muscle edema within the lateral deltoid muscle along the fascia concerning for mild myositis. Severe edema surrounding the biceps muscle with fascial enhancement most concerning for fasciitis. Mild soft tissue edema and subcutaneous fat of the lateral humerus with enhancement concerning for cellulitis. Synovitis within the long head of the proximal biceps tendon sheath. Small fluid collection anterior to the long head of the biceps tendon sheath with peripheral enhancement measuring 18 x 8 mm with surrounding edema. Small  fluid collection emanating from this collection extending into the subscapularis muscle. Rotator cuff is suboptimally evaluated secondary to large field of view. There is likely tendinosis with a small partial tear of the supraspinatus tendon. Soft tissue No soft tissue mass.  No other fluid collection or hematoma. IMPRESSION: 1. Severe edema surrounding the biceps muscle with fascial enhancement most concerning for fasciitis secondary to an infectious or less likely inflammatory etiology. Mild soft tissue edema and subcutaneous fat of the lateral humerus with enhancement concerning for cellulitis. 2. Synovitis within the proximal long head of the biceps tendon sheath. 3. Small fluid  collection anterior to the long head of the biceps tendon sheath with peripheral enhancement measuring 18 x 8 mm with surrounding edema. Small fluid collection emanating from this collection extending into the subscapularis muscle. The is a concerning for small abscesses. 4. Muscle edema within the lateral deltoid muscle along the fascia concerning for mild myositis. Electronically Signed   By: Kathreen Devoid   On: 01/24/2016 14:31   Jacob Nixon Contrast  Result Date: 01/24/2016 CLINICAL DATA:  Recurrent cellulitis. Pain and swelling in the right hand and arm. EXAM: MRI OF THE RIGHT HAND WITHOUT AND WITH CONTRAST TECHNIQUE: Multiplanar, multisequence Jacob imaging was performed both before and after administration of intravenous contrast. CONTRAST:  20 cc MultiHance COMPARISON:  07/30/2015 FINDINGS: Mild subcutaneous soft tissue swelling/ edema/fluid surrounding the fingers and along the dorsal and palmar aspects of the hand. There is also extensive fluid in the carpal tunnel and extending up into the hand as tenosynovitis. No rim enhancing soft tissue abscess is identified. No findings to suggest septic arthritis or osteomyelitis. No findings for pyomyositis. Stable scattered carpal cysts. IMPRESSION: Diffuse cellulitis but no  findings for focal soft tissue abscess. Carpal tunnel and flexor tendon tenosynovitis. No findings for septic arthritis or osteomyelitis. Electronically Signed   By: Marijo Sanes M.D.   On: 01/24/2016 15:58     Medications:  Scheduled: . ceFEPime (MAXIPIME) IV  2 g Intravenous Q8H  . docusate sodium  100 mg Oral BID  . enoxaparin (LOVENOX) injection  40 mg Subcutaneous Q24H  . predniSONE  60 mg Oral BID WC  . vancomycin  1,000 mg Intravenous Q8H   Continuous: . sodium chloride 75 mL/hr at 01/25/16 2259   RJJ:[id number] **OR** acetaminophen, HYDROmorphone (DILAUDID) injection, ondansetron (ZOFRAN) IV, oxyCODONE-acetaminophen, polyethylene glycol, promethazine  Assessment/Plan:  Active Problems:   Cellulitis of forearm, right   Cellulitis   Leukocytosis   Neuropathy (HCC)   Nephrolithiasis   Cellulitis of right upper extremity    Recurrent Cellulitis of the right upper extremity  Etiology is unclear. MRI of the right upper arm as well as the hand were done. Reports reviewed. Consulted Dr. Berenice Primas with orthopedics. Please see his notes for details. It is not clear if the patient truly has an infectious etiology for his presentation. Repeat ESR was 44. Repeat CRP 4.7. Total CK was 41. TSH 2.32. Aldolase is pending. Patient has been seen by Dr. Johnnye Sima with infectious disease who recommends continuing antibiotics, but is not opposed to trying steroids. Patient started on prednisone this morning. Patient has seen a rheumatologist in the past. He saw a physician assistant at [facility] rheumatology along with Dr. Amil Amen there. I did speak to one of the physician assistants in that office and she told me that the plan was for the patient to be sent to Dr. Amedeo Plenty for a synovial biopsy in May. However, it appears that patient lost his insurance and could not keep that appointment. There was no plans for any muscle biopsy of any kind. Discussed with Dr. Berenice Primas this morning. Plan is for patient  to be referred to IR to see if that fluid collection noted on MRI can be drained. Continue for now on broad-spectrum antibiotics with vancomycin and cefepime. Prednisone has been initiated. Other workup including rheumatoid factor and HIV have been negative. Pain control. Agents symptoms and presentation is not very suggestive of something like RSD, although its possibility cannot be ruled out.  Neuropathy Present in R fingertips ever since carpel tunnel. At  baseline. Could consider placing him on gabapentin.  Nephrolithiasis Multiple admissions for this in past. Asymptomatic  Leukocytosis Suspect from #1. WBC 13.5 w/ L shift. Lactic acid 1.17. WBC has improved. Will likely increase again now that he is on steroids.  DVT Prophylaxis: Lovenox    Code Status: Full code  Family Communication: Discussed with the patient  Disposition Plan: Await improvement in symptoms. Await specialty input. Mobilize.    LOS: 4 days   [facility] Pager [phone] 01/26/2016, 8:29 AM  If 7PM-7AM, please contact night-coverage at [email or web address], password [facility]

## 2016-01-26 NOTE — Progress Notes (Signed)
Patient ID: Jacob SheffieldAdrian Lanell Carvalho Jr., male   DOB: 18-Jun-1990, 25 y.o.   MRN: [id number] Request made for aspiration of small fluid collection in RUE seen on recent MRI.  We will plan to do this under US guidance today.  Dr. Luiz BlareGraves would like this sent for culture.  The procedure was explained to the patient and well as risks and complications including, but not limited to bleeding, infection, risk of injury to adjacent structures.  He understands and is agreeable to proceed.  The consent is signed and in the chart.  Keani Gotcher E 12:46 PM 01/26/2016

## 2016-01-26 NOTE — Progress Notes (Signed)
Subjective: Continued pain over the right upper extremity. Pain is greatest over the mid arm down towards the elbow. He is having some pain over the proximal arm today. He has full range of motion of the shoulder without significant pain.   Objective: Vital signs in last 24 hours: Temp:  [98.4 F (36.9 C)-100.4 F (38 C)] 98.7 F (37.1 C) (09/08 0528) Pulse Rate:  [90-93] 90 (09/08 0528) Resp:  [18] 18 (09/08 0528) BP: (131-135)/(74-82) 135/82 (09/08 0528) SpO2:  [100 %] 100 % (09/08 0528)  Intake/Output from previous day: 09/07 0701 - 09/08 0700 In: 1100 [P.O.:1000; IV Piggyback:100] Out: -  Intake/Output this shift: No intake/output data recorded.   Recent Labs  01/24/16 0540 01/25/16 0354 01/26/16 0448  HGB 13.0 12.7* 13.7    Recent Labs  01/25/16 0354 01/26/16 0448  WBC 9.3 8.3  RBC 4.34 4.73  HCT 38.7* 42.2  PLT 305 344    Recent Labs  01/25/16 0354 01/26/16 0448  NA 137 139  K 3.9 3.9  CL 103 106  CO2 26 27  BUN <5* 6  CREATININE 1.06 0.95  GLUCOSE 113* 90  CALCIUM 8.7* 8.9   No results for input(s): LABPT, INR in the last 72 hours.  Neurologically intact ABD soft Neurovascular intact Sensation intact distally Intact pulses distally No cellulitis present Compartment soft Greatest area of tenderness is in the distal substrate region air area.  Assessment/Plan: This is a complex 25 year old male with a long history of multiple admissions for upper extremity cellulitis and pain. These have resolved in the past with antibiotics. He's had 1 episode which was resolved with steroids. He is admitted now with MRI showing fluid in and around the wrist. There is fluid in the proximal biceps of about 1 x 2 cm. There is fasciitis type edema around the biceps muscle where his greatest pain is.//I spoke with infectious disease and they did not feel that if this is an infectious process that surgery is indicated. They think that if he has fasciitis it's  obviously not a necrotizing fasciitis into that extent that should be treatable with antibiotics. There not convinced that this is an infection. They are in agreement with the start of steroids. I spoke with his rheumatologist who in the past was questioning the possibility of a synovial biopsy of the wrist. Not a biopsy of muscle. I'm going to send the patient down to special procedures to have the proximal fluid collection drained. We will send that fluid for Gram stain, culture, sensitivities, cell counts if enough fluid is available. We will continue to follow the patient. The patient is somewhat frustrated in that we have not been able to make an accurate diagnosis I certainly share his frustration. I again spoke with the infectious disease people to see if they thought any surgery was indicated in the setting of not being able to find an actual abscess of significance. They felt that surgery was not indicated in the current situation. My partner will follow the patient this weekend and provide additional recommendations if the situation changes.   Madigan Rosensteel L 01/26/2016, 12:04 PM  [phone]

## 2016-01-26 NOTE — Progress Notes (Signed)
Pharmacy Antibiotic Note Jacob Sheffielddrian Lanell Studnicka Jr. is a 25 y.o. male admitted on 01/21/2016 with recurrent cellulitis. Currently on Cefepime and Vancomycin for treatment.   Vancomycin trough that was ordered this evening to be cancelled as patient inadvertently missed 10:00 am scheduled dose of vancomycin earlier today and level would be of little utility. SCr remains stable.    Plan: - Continue Cefepime 2 grams IV every 8 hours  - Continue Vancomycin 1000 mg IV every 8 hours for now; if remains on vancomycin will likely obtain trough in next 48 hours - SCr every 72 hours     Height: 5\' 11"  (180.3 cm) Weight: 252 lb (114.3 kg) IBW/kg (Calculated) : 75.3  Temp (24hrs), Avg:98.9 F (37.2 C), Min:97.9 F (36.6 C), Max:100.4 F (38 C)   Recent Labs Lab 01/21/16 2245 01/21/16 2301 01/22/16 0139 01/23/16 0451 01/24/16 0540 01/25/16 0354 01/26/16 0448  WBC 13.5*  --   --  10.8* 9.9 9.3 8.3  CREATININE 0.93  --   --  0.88 0.86 1.06 0.95  LATICACIDVEN  --  1.17 0.94  --   --   --   --     Estimated Creatinine Clearance: 151.5 mL/min (by C-G formula based on SCr of 0.95 mg/dL).    No Known Allergies  Antimicrobials this admission:  Cefepime 9/7 >>  Vancomycin 9/6 >>  Zosyn 9/5 >> 9/7 Clindamycin 9/4 >> 9/5  Dose adjustments this admission: n/a  Microbiology results: 9/3 BCx: neg F  9/4 BCx : ngtd 9/4 UCx: neg F  Thank you for allowing pharmacy to be a part of this patient's care.  Pollyann SamplesAndy Mykiah Schmuck, PharmD, BCPS 01/26/2016, 6:28 PM Pager: [phone]

## 2016-01-27 LAB — CULTURE, BLOOD (ROUTINE X 2): CULTURE: NO GROWTH

## 2016-01-27 LAB — ANTI-SCLERODERMA ANTIBODY

## 2016-01-27 LAB — ALDOLASE: ALDOLASE: 5.8 U/L (ref 3.3–10.3)

## 2016-01-27 MED ORDER — PANTOPRAZOLE SODIUM 40 MG PO TBEC
40.0000 mg | DELAYED_RELEASE_TABLET | Freq: Every day | ORAL | Status: DC
  Administered 2016-01-27 – 2016-01-29 (×3): 40 mg via ORAL
  Filled 2016-01-27 (×4): qty 1

## 2016-01-27 MED ORDER — GI COCKTAIL ~~LOC~~
30.0000 mL | Freq: Once | ORAL | Status: DC
  Filled 2016-01-27: qty 30

## 2016-01-27 NOTE — Progress Notes (Signed)
Subjective:   Patient has continue pain in upper arm but states that hand is improving. He is moving arm rather well during exam.  Objective: Vital signs in last 24 hours: Temp:  [97.7 F (36.5 C)-98.4 F (36.9 C)] 97.7 F (36.5 C) (09/09 0458) Pulse Rate:  [68-100] 79 (09/09 0458) Resp:  [16] 16 (09/08 2017) BP: (116-136)/(61-74) 116/61 (09/09 0458) SpO2:  [97 %-98 %] 97 % (09/09 0458)  Labs:  Recent Labs  01/25/16 0354 01/26/16 0448  HGB 12.7* 13.7    Recent Labs  01/25/16 0354 01/26/16 0448  WBC 9.3 8.3  RBC 4.34 4.73  HCT 38.7* 42.2  PLT 305 344    Recent Labs  01/25/16 0354 01/26/16 0448  NA 137 139  K 3.9 3.9  CL 103 106  CO2 26 27  BUN <5* 6  CREATININE 1.06 0.95  GLUCOSE 113* 90  CALCIUM 8.7* 8.9   No results for input(s): LABPT, INR in the last 72 hours.  Physical Exam:  Neurologically intact ABD soft Neurovascular intact Sensation intact distally Intact pulses distally No cellulitis present Compartment soft Has some mild tenderness to palpation throuout upper arm but no area of fluctuance to palpation.  Assessment/Plan:    Right arm pain.  We are glad that his hand is doing a little better today and that the early reports did not show and bacteria from the biopsy. No surgery indicated at this time. We would recommend no biopsy at this time also. We will continue to follow and monitor.  Laurabelle Gorczyca, Ginger OrganNDREW PAUL 01/27/2016, 7:50 AM

## 2016-01-27 NOTE — Progress Notes (Signed)
Patient ID: Jacob SheffieldAdrian Lanell Newlun Jr., male   DOB: 04/17/1991, 25 y.o.   MRN: [id number]          [facility]    Date of Admission:  01/21/2016    Total days of antibiotics 7        Day 4 vancomycin and cefepime  Jacob Nixon has recurrent right arm cellulitis of unknown cause. MRI revealed a small fluid collection anterior to the biceps tendon sheath. It was aspirated yesterday yielding 1 mL of "viscous fluid". There were no organisms seen on Gram stain and cultures are Nixon so far. Jacob Nixon. I will continue current antibiotics pending final cultures.         Cliffton AstersJohn Olia Hinderliter, MD [facility] [phone] pager   [phone] cell 05/23/2015, 1:32 PM

## 2016-01-27 NOTE — Plan of Care (Signed)
Problem: Pain Managment: Goal: General experience of comfort will improve Outcome: Progressing Medicated twice for pain this shift with moderate relief  Problem: Physical Regulation: Goal: Will remain free from infection Outcome: Progressing No s/s of infection noted, VS wNL  Problem: Tissue Perfusion: Goal: Risk factors for ineffective tissue perfusion will decrease Outcome: Progressing No s/s of dvt noted  Problem: Bowel/Gastric: Goal: Will not experience complications related to bowel motility Outcome: Progressing No bowel issues reported

## 2016-01-27 NOTE — Progress Notes (Signed)
[facility] PROGRESS NOTE  Jacob Nixon. IZT:[id number] DOB: 03-Jul-1990 DOA: 01/21/2016  PCP: No PCP Per Patient  Brief HPI: This is a 25 year old African-American male with a past medical history of right wrist carpal tunnel surgery in the past with recurrent episodes of cellulitis and pain in the right upper extremity. His been hospitalized for several similar episodes in the past. He came in with pain and swelling in his right hand which migrated up to his right upper arm. Patient was hospitalized for further evaluation.  Past medical history:  Past Medical History:  Diagnosis Date  . Bronchitis   . Cellulitis of right hand - RECURRENT   . Kidney stones   . Kidney stones     Consultants: Orthopedics. Infectious disease.  Procedures: Ultrasound-guided aspiration of the fluid in the proximal right arm by interventional radiology on 9/8  Antibiotics: Vancomycin  Zosyn was changed to Cefepime  Subjective: Patient states that he is not much better, but at the same time does not appear to be in any discomfort. He is able to move his right arm and hand without any difficulty. He does complain of some heartburn and hiccups today.   Objective:  Vital Signs  Vitals:   01/26/16 0528 01/26/16 1403 01/26/16 2017 01/27/16 0458  BP: 135/82 123/68 136/74 116/61  Pulse: 90 68 100 79  Resp: '18 16 16   ' Temp: 98.7 F (37.1 C) 97.9 F (36.6 C) 98.4 F (36.9 C) 97.7 F (36.5 C)  TempSrc: Oral Oral Oral Oral  SpO2: 100% 98% 98% 97%  Weight:      Height:        Intake/Output Summary (Last 24 hours) at 01/27/16 0847 Last data filed at 01/26/16 1406  Gross per 24 hour  Intake              460 ml  Output                0 ml  Net              460 ml   Filed Weights   01/21/16 2233 01/22/16 0547  Weight: 114.3 kg (252 lb) 114.3 kg (252 lb)    General appearance: alert, cooperative, appears stated age and no distress Resp: clear to auscultation bilaterally Cardio:  regular rate and rhythm, S1, S2 normal, no murmur, click, rub or gallop GI: soft, non-tender; bowel sounds normal; no masses,  no organomegaly Extremities: Swelling appears to be less compared to a few days ago. He has good range of motion of his right upper arm and his right wrist and hand. Neurologic: Patient is awake and alert. Oriented 3. No focal neurological deficits are noted.  Lab Results:  Data Reviewed: I have personally reviewed following labs and imaging studies  CBC:  Recent Labs Lab 01/21/16 2245 01/23/16 0451 01/24/16 0540 01/25/16 0354 01/26/16 0448  WBC 13.5* 10.8* 9.9 9.3 8.3  NEUTROABS 9.5*  --   --   --   --   HGB 14.7 13.6 13.0 12.7* 13.7  HCT 44.1 41.3 38.6* 38.7* 42.2  MCV 88.6 89.0 88.5 89.2 89.2  PLT 315 290 311 305 382   Basic Metabolic Panel:  Recent Labs Lab 01/21/16 2245 01/23/16 0451 01/24/16 0540 01/25/16 0354 01/26/16 0448  NA 137 137 136 137 139  K 3.9 4.0 4.0 3.9 3.9  CL 107 106 101 103 106  CO2 '23 24 23 26 27  ' GLUCOSE 103* 100* 105* 113* 90  BUN  10 6 <5* <5* 6  CREATININE 0.93 0.88 0.86 1.06 0.95  CALCIUM 9.2 8.6* 8.6* 8.7* 8.9   GFR: Estimated Creatinine Clearance: 151.5 mL/min (by C-G formula based on SCr of 0.95 mg/dL).  Liver Function Tests:  Recent Labs Lab 01/21/16 2245  AST 23  ALT 20  ALKPHOS 71  BILITOT 0.8  PROT 7.6  ALBUMIN 3.6   Urine analysis:    Component Value Date/Time   COLORURINE YELLOW 01/22/2016 0425   APPEARANCEUR CLOUDY (A) 01/22/2016 0425   LABSPEC 1.025 01/22/2016 0425   PHURINE 6.0 01/22/2016 0425   GLUCOSEU NEGATIVE 01/22/2016 0425   HGBUR NEGATIVE 01/22/2016 0425   BILIRUBINUR NEGATIVE 01/22/2016 0425   KETONESUR NEGATIVE 01/22/2016 0425   PROTEINUR NEGATIVE 01/22/2016 0425   UROBILINOGEN 1.0 11/28/2014 1026   NITRITE NEGATIVE 01/22/2016 0425   LEUKOCYTESUR NEGATIVE 01/22/2016 0425    Recent Results (from the past 240 hour(s))  Culture, blood (Routine x 2)     Status: None    Collection Time: 01/21/16 10:45 PM  Result Value Ref Range Status   Specimen Description BLOOD LEFT HAND  Final   Special Requests IN PEDIATRIC BOTTLE 4ML  Final   Culture NO GROWTH 5 DAYS  Final   Report Status 01/26/2016 FINAL  Final  Culture, blood (Routine x 2)     Status: None (Preliminary result)   Collection Time: 01/22/16  1:27 AM  Result Value Ref Range Status   Specimen Description BLOOD LEFT ARM  Final   Special Requests BOTTLES DRAWN AEROBIC AND ANAEROBIC 5ML  Final   Culture NO GROWTH 4 DAYS  Final   Report Status PENDING  Incomplete  Urine culture     Status: None   Collection Time: 01/22/16  4:25 AM  Result Value Ref Range Status   Specimen Description URINE, CLEAN CATCH  Final   Special Requests NONE  Final   Culture NO GROWTH  Final   Report Status 01/23/2016 FINAL  Final  Aerobic/Anaerobic Culture (surgical/deep wound)     Status: None (Preliminary result)   Collection Time: 01/26/16  6:48 PM  Result Value Ref Range Status   Specimen Description ABSCESS ARM RIGHT  Final   Special Requests NONE  Final   Gram Stain   Final    MODERATE WBC PRESENT, PREDOMINANTLY PMN NO ORGANISMS SEEN    Culture PENDING  Incomplete   Report Status PENDING  Incomplete      Radiology Studies: US Aspiration  Result Date: 01/27/2016 INDICATION: 25 year old male with a history of subcutaneous fluid, possible infection EXAM: US ASPIRATION MEDICATIONS: The patient is currently admitted to the hospital and receiving intravenous antibiotics. The antibiotics were administered within an appropriate time frame prior to the initiation of the procedure. ANESTHESIA/SEDATION: None COMPLICATIONS: None PROCEDURE: Informed written consent was obtained from the patient after a thorough discussion of the procedural risks, benefits and alternatives. All questions were addressed. Maximal Sterile Barrier Technique was utilized including caps, mask, sterile gowns, sterile gloves, sterile drape, hand hygiene  and skin antiseptic. A timeout was performed prior to the initiation of the procedure. Ultrasound survey of the right humeral soft tissues performed with images stored and sent to PACs. The patient was then prepped and draped in the usual sterile fashion. The skin and subcutaneous tissues were generously infiltrated 1% lidocaine for local anesthesia. Using ultrasound guidance, an 18 gauge needle was advanced into the largest component of fluid between the superficial soft tissues and the biceps musculature, at the fascia plane. Multiple attempt  to aspirate fluid yielded less than 1 cc of viscous fluid. Sample was sent to the lab for analysis. Final image was stored. Patient tolerated the procedure well and remained hemodynamically stable throughout. No complications were encountered and no significant blood loss encountered. IMPRESSION: Status post ultrasound-guided aspiration of viscous fluid centered at the fascia plane between the biceps musculature and the superficial tissues of the upper right arm. Less than 1 cc was aspirated for sample. Signed, Dulcy Fanny. Earleen Newport, DO [facility] Radiology Electronically Signed   By: Corrie Mckusick D.O.   On: 01/27/2016 08:05     Medications:  Scheduled: . ceFEPime (MAXIPIME) IV  2 g Intravenous Q8H  . docusate sodium  100 mg Oral BID  . enoxaparin (LOVENOX) injection  40 mg Subcutaneous Q24H  . predniSONE  60 mg Oral BID WC  . vancomycin  1,000 mg Intravenous Q8H   Continuous: . sodium chloride 75 mL/hr at 01/27/16 9179   XTA:[id number] **OR** acetaminophen, HYDROmorphone (DILAUDID) injection, ondansetron (ZOFRAN) IV, oxyCODONE-acetaminophen, polyethylene glycol, promethazine  Assessment/Plan:  Active Problems:   Cellulitis of forearm, right   Cellulitis   Leukocytosis   Neuropathy (HCC)   Nephrolithiasis   Cellulitis of right upper extremity   Abscess of upper arm    Recurrent Cellulitis of the  right upper extremity  Etiology Remains unclear. MRI of the right upper arm as well as the hand were done. Reports reviewed. Consulted Dr. Berenice Primas with orthopedics.It is not clear if the patient truly has an infectious etiology for his presentation. Repeat ESR was 44. Repeat CRP 4.7. Total CK was 41. TSH 2.32. Aldolase is pending. Patient has been seen by Dr. Johnnye Sima with infectious disease who recommends continuing antibiotics, but is not opposed to trying steroids. Patient started on prednisone. Patient has seen a rheumatologist in the past. He saw a physician assistant at [facility] rheumatology along with Dr. Amil Amen there. I did speak to one of the physician assistants in that office and she told me that the plan was for the patient to be sent to Dr. Amedeo Plenty for a synovial biopsy in May. However, it appears that patient lost his insurance and could not keep that appointment. There was no plans for any muscle biopsy of any kind. They also did quite an extensive rheumatological evaluation, all of which were negative. Patient underwent aspiration of the fluid collection noted on the MRI. Cultures are pending at this time. Continue vancomycin and cefepime. Continue prednisone. Other workup including rheumatoid factor and HIV have been negative. Pain control. Patient's symptoms and presentation is not very suggestive of something like RSD, although its possibility cannot be entirely ruled out.  Neuropathy Present in R fingertips ever since carpel tunnel. At baseline.  Nephrolithiasis Multiple admissions for this in past. Asymptomatic  Leukocytosis Suspect from #1. WBC 13.5 w/ L shift. Lactic acid 1.17. WBC has improved. Will likely increase again now that he is on steroids.  DVT Prophylaxis: Lovenox    Code Status: Full code  Family Communication: Discussed with the patient  Disposition Plan: Await improvement in symptoms. Await specialty input. Mobilize. Repeat labs tomorrow morning.    LOS: 5  days   [facility] Pager [phone] 01/27/2016, 8:47 AM  If 7PM-7AM, please contact night-coverage at [email or web address], password [facility]

## 2016-01-28 DIAGNOSIS — R12 Heartburn: Secondary | ICD-10-CM

## 2016-01-28 LAB — BASIC METABOLIC PANEL
ANION GAP: 5 (ref 5–15)
BUN: 9 mg/dL (ref 6–20)
CALCIUM: 9.4 mg/dL (ref 8.9–10.3)
CO2: 27 mmol/L (ref 22–32)
Chloride: 105 mmol/L (ref 101–111)
Creatinine, Ser: 0.78 mg/dL (ref 0.61–1.24)
Glucose, Bld: 189 mg/dL — ABNORMAL HIGH (ref 65–99)
POTASSIUM: 4.3 mmol/L (ref 3.5–5.1)
SODIUM: 137 mmol/L (ref 135–145)

## 2016-01-28 LAB — CBC
HCT: 42.3 % (ref 39.0–52.0)
Hemoglobin: 13.7 g/dL (ref 13.0–17.0)
MCH: 29 pg (ref 26.0–34.0)
MCHC: 32.4 g/dL (ref 30.0–36.0)
MCV: 89.6 fL (ref 78.0–100.0)
PLATELETS: 395 10*3/uL (ref 150–400)
RBC: 4.72 MIL/uL (ref 4.22–5.81)
RDW: 13.6 % (ref 11.5–15.5)
WBC: 20.3 10*3/uL — AB (ref 4.0–10.5)

## 2016-01-28 MED ORDER — ALUM & MAG HYDROXIDE-SIMETH 200-200-20 MG/5ML PO SUSP: 30.0000 mL | ORAL | Status: DC | PRN

## 2016-01-28 MED ORDER — GI COCKTAIL ~~LOC~~
30.0000 mL | Freq: Once | ORAL | Status: AC
  Administered 2016-01-28: 30 mL via ORAL
  Filled 2016-01-28: qty 30

## 2016-01-28 NOTE — Progress Notes (Signed)
[facility] PROGRESS NOTE  [name]. KNL:[id number] DOB: [birth year] DOA: 01/21/2016  PCP: No PCP Per Patient  Brief HPI: This is a 25 year old African-American male with a past medical history of right wrist carpal tunnel surgery in the past with recurrent episodes of cellulitis and pain in the right upper extremity. His been hospitalized for several similar episodes in the past. He came in with pain and swelling in his right hand which migrated up to his right upper arm. Patient was hospitalized for further evaluation.  Past medical history:  Past Medical History:  Diagnosis Date  . Bronchitis   . Cellulitis of right hand - RECURRENT   . Kidney stones   . Kidney stones     Consultants: Orthopedics. Infectious disease.  Procedures: Ultrasound-guided aspiration of the fluid in the proximal right arm by interventional radiology on 9/8  Antibiotics: Vancomycin  Zosyn was changed to Cefepime  Subjective: Patient feels okay. Thinks that his swelling in the right arm may have reduced some. Denies any significant pain. Continues to have heartburn and hiccups.    Objective:  Vital Signs  Vitals:   01/27/16 0458 01/27/16 1300 01/27/16 2104 01/28/16 0458  BP: 116/61 119/67 (!) 116/54 129/62  Pulse: 79 74 65 77  Resp:      Temp: 97.7 F (36.5 C) 98.2 F (36.8 C) 98.8 F (37.1 C) 98.2 F (36.8 C)  TempSrc: Oral Oral Oral Oral  SpO2: 97% 94% 97% 100%  Weight:      Height:        Intake/Output Summary (Last 24 hours) at 01/28/16 0858 Last data filed at 01/28/16 0000  Gross per 24 hour  Intake              450 ml  Output                0 ml  Net              450 ml   Filed Weights   01/21/16 2233 01/22/16 0547  Weight: 114.3 kg (252 lb) 114.3 kg (252 lb)    General appearance: alert, cooperative, appears stated age and no distress Resp: clear to auscultation bilaterally Cardio: regular rate and rhythm, S1, S2 normal, no murmur, click, rub or  gallop GI: soft, non-tender; bowel sounds normal; no masses,  no organomegaly Extremities: Right upper arm swelling appears to be less than yesterday. He has good range of motion of his right upper arm and his right wrist and hand. Neurologic: Patient is awake and alert. Oriented 3. No focal neurological deficits are noted.  Lab Results:  Data Reviewed: I have personally reviewed following labs and imaging studies  CBC:  Recent Labs Lab 01/21/16 2245 01/23/16 0451 01/24/16 0540 01/25/16 0354 01/26/16 0448 01/28/16 0421  WBC 13.5* 10.8* 9.9 9.3 8.3 20.3*  NEUTROABS 9.5*  --   --   --   --   --   HGB 14.7 13.6 13.0 12.7* 13.7 13.7  HCT 44.1 41.3 38.6* 38.7* 42.2 42.3  MCV 88.6 89.0 88.5 89.2 89.2 89.6  PLT 315 290 311 305 344 790   Basic Metabolic Panel:  Recent Labs Lab 01/23/16 0451 01/24/16 0540 01/25/16 0354 01/26/16 0448 01/28/16 0421  NA 137 136 137 139 137  K 4.0 4.0 3.9 3.9 4.3  CL 106 101 103 106 105  CO2 _0 GLUCOSE 100* 105* 113* 90 189*  BUN 6 <5* <5* 6 9  CREATININE  0.88 0.86 1.06 0.95 0.78  CALCIUM 8.6* 8.6* 8.7* 8.9 9.4   GFR: Estimated Creatinine Clearance: 179.9 mL/min (by C-G formula based on SCr of 0.8 mg/dL).  Liver Function Tests:  Recent Labs Lab 01/21/16 2245  AST 23  ALT 20  ALKPHOS 71  BILITOT 0.8  PROT 7.6  ALBUMIN 3.6   Urine analysis:    Component Value Date/Time   COLORURINE YELLOW 01/22/2016 0425   APPEARANCEUR CLOUDY (A) 01/22/2016 0425   LABSPEC 1.025 01/22/2016 0425   PHURINE 6.0 01/22/2016 0425   GLUCOSEU NEGATIVE 01/22/2016 0425   HGBUR NEGATIVE 01/22/2016 0425   BILIRUBINUR NEGATIVE 01/22/2016 0425   KETONESUR NEGATIVE 01/22/2016 0425   PROTEINUR NEGATIVE 01/22/2016 0425   UROBILINOGEN 1.0 11/28/2014 1026   NITRITE NEGATIVE 01/22/2016 0425   LEUKOCYTESUR NEGATIVE 01/22/2016 0425    Recent Results (from the past 240 hour(s))  Culture, blood (Routine x 2)     Status: None   Collection Time:  01/21/16 10:45 PM  Result Value Ref Range Status   Specimen Description BLOOD LEFT HAND  Final   Special Requests IN PEDIATRIC BOTTLE 4ML  Final   Culture NO GROWTH 5 DAYS  Final   Report Status 01/26/2016 FINAL  Final  Culture, blood (Routine x 2)     Status: None   Collection Time: 01/22/16  1:27 AM  Result Value Ref Range Status   Specimen Description BLOOD LEFT ARM  Final   Special Requests BOTTLES DRAWN AEROBIC AND ANAEROBIC 5ML  Final   Culture NO GROWTH 5 DAYS  Final   Report Status 01/27/2016 FINAL  Final  Urine culture     Status: None   Collection Time: 01/22/16  4:25 AM  Result Value Ref Range Status   Specimen Description URINE, CLEAN CATCH  Final   Special Requests NONE  Final   Culture NO GROWTH  Final   Report Status 01/23/2016 FINAL  Final  Aerobic/Anaerobic Culture (surgical/deep wound)     Status: None (Preliminary result)   Collection Time: 01/26/16  6:48 PM  Result Value Ref Range Status   Specimen Description ABSCESS ARM RIGHT  Final   Special Requests NONE  Final   Gram Stain   Final    MODERATE WBC PRESENT, PREDOMINANTLY PMN NO ORGANISMS SEEN    Culture NO GROWTH 1 DAY  Final   Report Status PENDING  Incomplete      Radiology Studies: US Aspiration  Result Date: 01/27/2016 INDICATION: 25 year old male with a history of subcutaneous fluid, possible infection EXAM: US ASPIRATION MEDICATIONS: The patient is currently admitted to the hospital and receiving intravenous antibiotics. The antibiotics were administered within an appropriate time frame prior to the initiation of the procedure. ANESTHESIA/SEDATION: None COMPLICATIONS: None PROCEDURE: Informed written consent was obtained from the patient after a thorough discussion of the procedural risks, benefits and alternatives. All questions were addressed. Maximal Sterile Barrier Technique was utilized including caps, mask, sterile gowns, sterile gloves, sterile drape, hand hygiene and skin antiseptic. A timeout  was performed prior to the initiation of the procedure. Ultrasound survey of the right humeral soft tissues performed with images stored and sent to PACs. The patient was then prepped and draped in the usual sterile fashion. The skin and subcutaneous tissues were generously infiltrated 1% lidocaine for local anesthesia. Using ultrasound guidance, an 18 gauge needle was advanced into the largest component of fluid between the superficial soft tissues and the biceps musculature, at the fascia plane. Multiple attempt to aspirate fluid yielded less  than 1 cc of viscous fluid. Sample was sent to the lab for analysis. Final image was stored. Patient tolerated the procedure well and remained hemodynamically stable throughout. No complications were encountered and no significant blood loss encountered. IMPRESSION: Status post ultrasound-guided aspiration of viscous fluid centered at the fascia plane between the biceps musculature and the superficial tissues of the upper right arm. Less than 1 cc was aspirated for sample. Signed, Dulcy Fanny. Earleen Newport, DO [facility] Radiology Electronically Signed   By: Corrie Mckusick D.O.   On: 01/27/2016 08:05     Medications:  Scheduled: . ceFEPime (MAXIPIME) IV  2 g Intravenous Q8H  . docusate sodium  100 mg Oral BID  . enoxaparin (LOVENOX) injection  40 mg Subcutaneous Q24H  . gi cocktail  30 mL Oral Once  . pantoprazole  40 mg Oral Q1200  . predniSONE  60 mg Oral BID WC  . vancomycin  1,000 mg Intravenous Q8H   Continuous: . sodium chloride 50 mL/hr at 01/27/16 2200   HCW:[id number] **OR** acetaminophen, alum & mag hydroxide-simeth, HYDROmorphone (DILAUDID) injection, ondansetron (ZOFRAN) IV, oxyCODONE-acetaminophen, polyethylene glycol, promethazine  Assessment/Plan:  Active Problems:   Cellulitis of forearm, right   Cellulitis   Leukocytosis   Neuropathy (HCC)   Nephrolithiasis   Cellulitis of right upper  extremity   Abscess of upper arm    Recurrent Cellulitis of the right upper extremity  Patient has improved some over the last 48 hours. It's quite possible that the steroids are responsible for this. Cultures of the aspirated fluid is negative so far. Anticipate that his antibiotics will be changed over tomorrow by infectious disease. Etiology Remains unclear. MRI of the right upper arm as well as the hand were done. Reports reviewed. Consulted Dr. Berenice Primas with orthopedics.It is not clear if the patient truly has an infectious etiology for his presentation. Repeat ESR was 44. Repeat CRP 4.7. Total CK was 41. TSH 2.32. Aldolase is pending. Patient has been seen by Dr. Johnnye Sima with infectious disease who recommends continuing antibiotics, but is not opposed to trying steroids. Patient started on prednisone. Patient has seen a rheumatologist in the past. He saw a physician assistant at [facility] rheumatology along with Dr. Amil Amen there. I did speak to one of the physician assistants in that office and she told me that the plan was for the patient to be sent to Dr. Amedeo Plenty for a synovial biopsy in May. However, it appears that patient lost his insurance and could not keep that appointment. There was no plans for any muscle biopsy of any kind. They also did quite an extensive rheumatological evaluation, all of which were negative. Patient underwent aspiration of the fluid collection noted on the MRI. Cultures are negative so far. Continue vancomycin and cefepime. Continue prednisone. Other workup including rheumatoid factor and HIV have been negative. Pain control. Patient's symptoms and presentation is not very suggestive of something like RSD, although its possibility cannot be entirely ruled out.   Neuropathy Present in R fingertips ever since carpel tunnel. At baseline.  Nephrolithiasis Multiple admissions for this in past. Asymptomatic  Leukocytosis Initially thought to be secondary to his primary  process. Elevated levels today secondary to steroid. He is afebrile.  Heartburn and hiccups Heartburn, most likely secondary to steroids. PPI was initiated yesterday. GI cocktail. Hiccups should resolve as his other symptoms improve.  DVT Prophylaxis: Lovenox    Code Status: Full code  Family Communication: Discussed with the patient  Disposition Plan:  He should continue to mobilize. Anticipate he'll be able to go home soon once he has been seen by infectious disease and his antibiotics have been changed.    LOS: 6 days   [place] [facility] Pager [phone] 01/28/2016, 8:58 AM  If 7PM-7AM, please contact night-coverage at [email or web address], password [facility]

## 2016-01-28 NOTE — Progress Notes (Signed)
Subjective:    Patient states that his arm is doing much better this morning. He is actively using the arm like normal turning over in the bed and sitting up. He states that he would like to go home as soon as possible.  Objective: Vital signs in last 24 hours: Temp:  [98.2 F (36.8 C)-98.8 F (37.1 C)] 98.2 F (36.8 C) (09/10 0458) Pulse Rate:  [65-77] 77 (09/10 0458) BP: (116-129)/(54-67) 129/62 (09/10 0458) SpO2:  [94 %-100 %] 100 % (09/10 0458)  Labs:  Recent Labs  01/26/16 0448 01/28/16 0421  HGB 13.7 13.7    Recent Labs  01/26/16 0448 01/28/16 0421  WBC 8.3 20.3*  RBC 4.73 4.72  HCT 42.2 42.3  PLT 344 395    Recent Labs  01/26/16 0448 01/28/16 0421  NA 139 137  K 3.9 4.3  CL 106 105  CO2 27 27  BUN 6 9  CREATININE 0.95 0.78  GLUCOSE 90 189*  CALCIUM 8.9 9.4   No results for input(s): LABPT, INR in the last 72 hours.  Physical Exam:  Neurologically intact ABD soft Neurovascular intact Sensation intact distally Intact pulses distally Dorsiflexion/Plantar flexion intact No cellulitis present Compartment soft  Assessment/Plan: Right arm pain improved     We are glad that his hand/arm is doing much better today and that the early reports did not show and bacteria from the biopsy. No surgery indicated at this time. We would recommend no biopsy at this time also. From an orhtopaedic standpoint he is good to gone once medicine and ID clears him. He should follow up with rheumatology on an outpatient basis. We will continue to follow and monitor.  Nuel Dejaynes, Ginger OrganNDREW PAUL 01/28/2016, 10:21 AM

## 2016-01-28 NOTE — Progress Notes (Signed)
Patient ID: Jacob SheffieldAdrian Lanell Schwanz Jr., male   DOB: 1990-12-13, 25 y.o.   MRN: [id number]          [facility]    Date of Admission:  01/21/2016    Total days of antibiotics 8        Day 5 vancomycin and cefepime  No organisms were seen on the Gram stain of the fluid aspirated from his right upper arm. Cultures are no growth at 24 hours. He seems to be getting better. He remains unclear what is causing his recurrent right arm erythema and pain. If cultures remain negative I would consider discharge soon on a few more days of doxycycline and cephalexin.        Cliffton AstersJohn Candyce Gambino, MD [facility] [phone] pager   [phone] cell 05/23/2015, 1:32 PM

## 2016-01-28 NOTE — Progress Notes (Signed)
Pt continues to have hiccups. When they stopped temporarily, he complained of indigestion.

## 2016-01-29 MED ORDER — DOCUSATE SODIUM 100 MG PO CAPS: 100.0000 mg | ORAL_CAPSULE | Freq: Two times a day (BID) | ORAL | 0 refills | Status: DC

## 2016-01-29 MED ORDER — CEPHALEXIN 500 MG PO CAPS
500.0000 mg | ORAL_CAPSULE | Freq: Four times a day (QID) | ORAL | Status: DC
Start: 2016-01-29 — End: 2016-01-29
  Administered 2016-01-29: 500 mg via ORAL
  Filled 2016-01-29: qty 1

## 2016-01-29 MED ORDER — ALUM & MAG HYDROXIDE-SIMETH 200-200-20 MG/5ML PO SUSP: 30.0000 mL | ORAL | 0 refills | Status: DC | PRN

## 2016-01-29 MED ORDER — CEPHALEXIN 500 MG PO CAPS: 500.0000 mg | ORAL_CAPSULE | Freq: Four times a day (QID) | ORAL | 0 refills | Status: DC

## 2016-01-29 MED ORDER — OMEPRAZOLE 20 MG PO CPDR: 20.0000 mg | DELAYED_RELEASE_CAPSULE | Freq: Every day | ORAL | 0 refills | Status: DC

## 2016-01-29 MED ORDER — DOXYCYCLINE HYCLATE 100 MG PO TABS
100.0000 mg | ORAL_TABLET | Freq: Two times a day (BID) | ORAL | Status: DC
  Administered 2016-01-29: 100 mg via ORAL
  Filled 2016-01-29: qty 1

## 2016-01-29 MED ORDER — DOXYCYCLINE HYCLATE 100 MG PO TABS: 100.0000 mg | ORAL_TABLET | Freq: Two times a day (BID) | ORAL | 0 refills | Status: DC

## 2016-01-29 MED ORDER — POLYETHYLENE GLYCOL 3350 17 G PO PACK: 17.0000 g | PACK | Freq: Every day | ORAL | 0 refills | Status: DC | PRN

## 2016-01-29 MED ORDER — OXYCODONE-ACETAMINOPHEN 5-325 MG PO TABS: 1.0000 | ORAL_TABLET | ORAL | 0 refills | Status: DC | PRN

## 2016-01-29 MED ORDER — PREDNISONE 20 MG PO TABS: ORAL_TABLET | ORAL | 0 refills | Status: DC

## 2016-01-29 NOTE — Progress Notes (Signed)
Pt IV removed. Pt received discharge instructions and verbalized understanding. Discharged via wheelchair by volunteer services.

## 2016-01-29 NOTE — Discharge Summary (Signed)
[facility]  Physician Discharge Summary   Patient ID: [name]. MRN: [id number] DOB/AGE: Feb 03, 1991 25 y.o.  Admit date: 01/21/2016 Discharge date: 01/29/2016  PCP: No PCP Per Patient  DISCHARGE DIAGNOSES:  Active Problems:   Cellulitis of forearm, right   Cellulitis   Leukocytosis   Neuropathy (HCC)   Nephrolithiasis   Cellulitis of right upper extremity   Abscess of upper arm   RECOMMENDATIONS FOR OUTPATIENT FOLLOW UP: 1. Outpatient follow-up with rheumatologist and orthopedics   DISCHARGE CONDITION: fair  Diet recommendation: Regular  Filed Weights   01/21/16 2233 01/22/16 0547  Weight: 114.3 kg (252 lb) 114.3 kg (252 lb)    INITIAL HISTORY: This is a 26 year old African-American male with a past medical history of right wrist carpal tunnel surgery in the past with recurrent episodes of cellulitis and pain in the right upper extremity. His been hospitalized for several similar episodes in the past. He came in with pain and swelling in his right hand which migrated up to his right upper arm. Patient was hospitalized for further evaluation.  Consultations:  Orthopedics and infectious disease. Phone discussion with Dr. Melissa Noon PA with rheumatology.  Procedures: Ultrasound-guided aspiration of the fluid in the proximal right arm by interventional radiology on 9/8  HOSPITAL COURSE:   Recurrent Cellulitis of the right upper extremity  Patient was admitted to the hospital with complaints of right arm pain and swelling. Etiology Remains unclear. MRI of the right upper arm as well as the hand were done. Reports reviewed. Consulted Dr. Berenice Primas with orthopedics. It is not clear if the patient truly has an infectious etiology for his presentation. Repeat ESR was 44. Repeat CRP 4.7. Total CK was 41. TSH 2.32. Aldolase is pending. Patient was also seen by infectious diseases, Dr. Johnnye Sima. Patient was placed on broad-spectrum antibiotics. However, it was  felt that he may also benefit from steroids. He was initiated on prednisone. Patient's symptoms have improved significantly over the last 3 days. His swelling has reduced. His pain is improved. It's quite possible that the steroids are responsible for this. Phone discussions held also with patient's rheumatology office. He was also seen by interventional radiology and had aspiration of the fluid from his upper arm noted on the MRI. The fluid cultures are negative so far. Recommendations are to change to oral antibiotics, which will be done today to doxycycline and Keflex. Patient will be discharged today with the tapering doses of steroids as well. He will be followed by Dr. Berenice Primas in about 10 days' time. Patient will also attempt to follow-up with his rheumatologist, even though there are some insurance issues. [facility] as well. This has been a very challenging case. Patient might benefit from being referred to a tertiary center for further evaluation and help with diagnosis. His lack of insurance, could be a detriment in this. We will first have him follow-up with his local providers and then further decisions can be made afterwards. Other workup including rheumatoid factor and HIV have been negative. Patient's symptoms and presentation is not very suggestive of something like RSD, although its possibility cannot be entirely ruled out.   Neuropathy Present in R fingertips ever since carpel tunnel. At baseline.  Nephrolithiasis Multiple admissions for this in past. Asymptomatic  Leukocytosis Initially thought to be secondary to his primary process. WBC did climb due to steroids. He is afebrile.  Heartburn and hiccups Heartburn, most likely secondary to steroids. Patient  was given PPI and Maalox. He has improved.  Overall improved. Note satisfactory diagnosis for his condition is a available at this time. Patient is frustrated and quite  understandably so. He, however, is better. He will follow-up with his providers in the outpatient setting. Medically stable for discharge.   PERTINENT LABS:  The results of significant diagnostics from this hospitalization (including imaging, microbiology, ancillary and laboratory) are listed below for reference.    Microbiology: Recent Results (from the past 240 hour(s))  Culture, blood (Routine x 2)     Status: None   Collection Time: 01/21/16 10:45 PM  Result Value Ref Range Status   Specimen Description BLOOD LEFT HAND  Final   Special Requests IN PEDIATRIC BOTTLE 4ML  Final   Culture NO GROWTH 5 DAYS  Final   Report Status 01/26/2016 FINAL  Final  Culture, blood (Routine x 2)     Status: None   Collection Time: 01/22/16  1:27 AM  Result Value Ref Range Status   Specimen Description BLOOD LEFT ARM  Final   Special Requests BOTTLES DRAWN AEROBIC AND ANAEROBIC 5ML  Final   Culture NO GROWTH 5 DAYS  Final   Report Status 01/27/2016 FINAL  Final  Urine culture     Status: None   Collection Time: 01/22/16  4:25 AM  Result Value Ref Range Status   Specimen Description URINE, CLEAN CATCH  Final   Special Requests NONE  Final   Culture NO GROWTH  Final   Report Status 01/23/2016 FINAL  Final  Aerobic/Anaerobic Culture (surgical/deep wound)     Status: None (Preliminary result)   Collection Time: 01/26/16  6:48 PM  Result Value Ref Range Status   Specimen Description ABSCESS ARM RIGHT  Final   Special Requests NONE  Final   Gram Stain   Final    MODERATE WBC PRESENT, PREDOMINANTLY PMN NO ORGANISMS SEEN    Culture   Final    NO GROWTH 2 DAYS NO ANAEROBES ISOLATED; CULTURE IN PROGRESS FOR 5 DAYS   Report Status PENDING  Incomplete     Labs: Basic Metabolic Panel:  Recent Labs Lab 01/23/16 0451 01/24/16 0540 01/25/16 0354 01/26/16 0448 01/28/16 0421  NA 137 136 137 139 137  K 4.0 4.0 3.9 3.9 4.3  CL 106 101 103 106 105  CO2 '24 23 26 27 27  ' GLUCOSE 100* 105* 113* 90  189*  BUN 6 <5* <5* 6 9  CREATININE 0.88 0.86 1.06 0.95 0.78  CALCIUM 8.6* 8.6* 8.7* 8.9 9.4   CBC:  Recent Labs Lab 01/23/16 0451 01/24/16 0540 01/25/16 0354 01/26/16 0448 01/28/16 0421  WBC 10.8* 9.9 9.3 8.3 20.3*  HGB 13.6 13.0 12.7* 13.7 13.7  HCT 41.3 38.6* 38.7* 42.2 42.3  MCV 89.0 88.5 89.2 89.2 89.6  PLT 290 311 305 344 395   Cardiac Enzymes:  Recent Labs Lab 01/25/16 1138  CKTOTAL 41*    IMAGING STUDIES Dg Chest 2 View  Result Date: 01/21/2016 CLINICAL DATA:  25 year old male with fever. EXAM: CHEST  2 VIEW COMPARISON:  Chest radiograph dated 10/02/2015 FINDINGS: The heart size and mediastinal contours are within normal limits. Both lungs are clear. The visualized skeletal structures are unremarkable. IMPRESSION: No active cardiopulmonary disease. Electronically Signed   By: Anner Crete M.D.   On: 01/21/2016 23:15   Mr Humerus Right W Wo Contrast  Result Date: 01/24/2016 CLINICAL DATA:  Right wrist carpal tunnel surgery. Recurrent episodes of cellulitis of the right upper extremity. EXAM: MRI  OF THE RIGHT HUMERUS WITHOUT AND WITH CONTRAST TECHNIQUE: Multiplanar, multisequence MR imaging was performed both before and after administration of intravenous contrast. CONTRAST:  78m MULTIHANCE GADOBENATE DIMEGLUMINE 529 MG/ML IV SOLN COMPARISON:  None. FINDINGS: Bones/Joint/Cartilage No marrow signal abnormality. No fracture or dislocation. No aggressive osseous lesion. Normal alignment. No joint effusion. Muscles and Tendons Muscle edema within the lateral deltoid muscle along the fascia concerning for mild myositis. Severe edema surrounding the biceps muscle with fascial enhancement most concerning for fasciitis. Mild soft tissue edema and subcutaneous fat of the lateral humerus with enhancement concerning for cellulitis. Synovitis within the long head of the proximal biceps tendon sheath. Small fluid collection anterior to the long head of the biceps tendon sheath with  peripheral enhancement measuring 18 x 8 mm with surrounding edema. Small fluid collection emanating from this collection extending into the subscapularis muscle. Rotator cuff is suboptimally evaluated secondary to large field of view. There is likely tendinosis with a small partial tear of the supraspinatus tendon. Soft tissue No soft tissue mass.  No other fluid collection or hematoma. IMPRESSION: 1. Severe edema surrounding the biceps muscle with fascial enhancement most concerning for fasciitis secondary to an infectious or less likely inflammatory etiology. Mild soft tissue edema and subcutaneous fat of the lateral humerus with enhancement concerning for cellulitis. 2. Synovitis within the proximal long head of the biceps tendon sheath. 3. Small fluid collection anterior to the long head of the biceps tendon sheath with peripheral enhancement measuring 18 x 8 mm with surrounding edema. Small fluid collection emanating from this collection extending into the subscapularis muscle. The is a concerning for small abscesses. 4. Muscle edema within the lateral deltoid muscle along the fascia concerning for mild myositis. Electronically Signed   By: HKathreen Devoid  On: 01/24/2016 14:31   Mr Hand Right W Wo Contrast  Result Date: 01/24/2016 CLINICAL DATA:  Recurrent cellulitis. Pain and swelling in the right hand and arm. EXAM: MRI OF THE RIGHT HAND WITHOUT AND WITH CONTRAST TECHNIQUE: Multiplanar, multisequence MR imaging was performed both before and after administration of intravenous contrast. CONTRAST:  20 cc MultiHance COMPARISON:  07/30/2015 FINDINGS: Mild subcutaneous soft tissue swelling/ edema/fluid surrounding the fingers and along the dorsal and palmar aspects of the hand. There is also extensive fluid in the carpal tunnel and extending up into the hand as tenosynovitis. No rim enhancing soft tissue abscess is identified. No findings to suggest septic arthritis or osteomyelitis. No findings for pyomyositis.  Stable scattered carpal cysts. IMPRESSION: Diffuse cellulitis but no findings for focal soft tissue abscess. Carpal tunnel and flexor tendon tenosynovitis. No findings for septic arthritis or osteomyelitis. Electronically Signed   By: PMarijo SanesM.D.   On: 01/24/2016 15:58   [place]  Result Date: 01/27/2016 INDICATION: 25year old male with a history of subcutaneous fluid, possible infection EXAM: [place]IRATION MEDICATIONS: The patient is currently admitted to the hospital and receiving intravenous antibiotics. The antibiotics were administered within an appropriate time frame prior to the initiation of the procedure. ANESTHESIA/SEDATION: None COMPLICATIONS: None PROCEDURE: Informed written consent was obtained from the patient after a thorough discussion of the procedural risks, benefits and alternatives. All questions were addressed. Maximal Sterile Barrier Technique was utilized including caps, mask, sterile gowns, sterile gloves, sterile drape, hand hygiene and skin antiseptic. A timeout was performed prior to the initiation of the procedure. Ultrasound survey of the right humeral soft tissues performed with images stored and sent to PACs. The patient was then prepped  and draped in the usual sterile fashion. The skin and subcutaneous tissues were generously infiltrated 1% lidocaine for local anesthesia. Using ultrasound guidance, an 18 gauge needle was advanced into the largest component of fluid between the superficial soft tissues and the biceps musculature, at the fascia plane. Multiple attempt to aspirate fluid yielded less than 1 cc of viscous fluid. Sample was sent to the lab for analysis. Final image was stored. Patient tolerated the procedure well and remained hemodynamically stable throughout. No complications were encountered and no significant blood loss encountered. IMPRESSION: Status post ultrasound-guided aspiration of viscous fluid centered at the fascia plane between the biceps  musculature and the superficial tissues of the upper right arm. Less than 1 cc was aspirated for sample. Signed, Dulcy Fanny. Earleen Newport, DO [facility] Electronically Signed   By: Corrie Mckusick D.O.   On: 01/27/2016 08:05    DISCHARGE EXAMINATION: Vitals:   01/28/16 1344 01/28/16 2141 01/29/16 0548 01/29/16 1224  BP: 122/64 124/71 120/72 122/75  Pulse: 68 68 (!) 57 72  Resp: 18   16  Temp: 98 F (36.7 C) 98.7 F (37.1 C) 97.7 F (36.5 C) 98.7 F (37.1 C)  TempSrc: Oral Oral Oral Oral  SpO2: 98% 98% 97%   Weight:      Height:       General appearance: alert, cooperative, appears stated age and no distress Resp: clear to auscultation bilaterally Cardio: regular rate and rhythm, S1, S2 normal, no murmur, click, rub or gallop GI: soft, non-tender; bowel sounds normal; no masses,  no organomegaly Extremities: Improving swelling and tenderness of the right upper arm. Good peripheral pulses.  DISPOSITION: Home  Discharge Instructions    Call MD for:  difficulty breathing, headache or visual disturbances    Complete by:  As directed   Call MD for:  extreme fatigue    Complete by:  As directed   Call MD for:  persistant dizziness or light-headedness    Complete by:  As directed   Call MD for:  persistant nausea and vomiting    Complete by:  As directed   Call MD for:  redness, tenderness, or signs of infection (pain, swelling, redness, odor or green/yellow discharge around incision site)    Complete by:  As directed   Call MD for:  severe uncontrolled pain    Complete by:  As directed   Call MD for:  temperature >100.4    Complete by:  As directed   Diet general    Complete by:  As directed   Discharge instructions    Complete by:  As directed   Please take all your medications as prescribed. Please follow-up with Dr. Berenice Primas in 10 days. You may benefit from referral to an academic center such as [facility] or [facility] to help with diagnoses of  your condition. Please discuss this with Dr. Amil Amen at follow-up. If he unable to see him in follow-up due to insurance issue, then the [facility] may be able to refer you to the rheumatology clinic in one of these two academic centers.  You were cared for by a hospitalist during your hospital stay. If you have any questions about your discharge medications or the care you received while you were in the hospital after you are discharged, you can call the unit and asked to speak with the hospitalist on call if the hospitalist that took care of you is not available. Once you are discharged,  your primary care physician will handle any further medical issues. Please note that NO REFILLS for any discharge medications will be authorized once you are discharged, as it is imperative that you return to your primary care physician (or establish a relationship with a primary care physician if you do not have one) for your aftercare needs so that they can reassess your need for medications and monitor your lab values. If you do not have a primary care physician, you can call [phone] for a physician referral.   Increase activity slowly    Complete by:  As directed      ALLERGIES: No Known Allergies   Current Discharge Medication List    START taking these medications   Details  alum & mag hydroxide-simeth (MAALOX/MYLANTA) 200-200-20 MG/5ML suspension Take 30 mLs by mouth every 4 (four) hours as needed for indigestion or heartburn. Qty: 355 mL, Refills: 0    cephALEXin (KEFLEX) 500 MG capsule Take 1 capsule (500 mg total) by mouth every 6 (six) hours. Qty: 20 capsule, Refills: 0    docusate sodium (COLACE) 100 MG capsule Take 1 capsule (100 mg total) by mouth 2 (two) times daily. Qty: 60 capsule, Refills: 0    doxycycline (VIBRA-TABS) 100 MG tablet Take 1 tablet (100 mg total) by mouth every 12 (twelve) hours. Qty: 10 tablet, Refills: 0    omeprazole (PRILOSEC) 20 MG capsule  Take 1 capsule (20 mg total) by mouth daily. Qty: 30 capsule, Refills: 0    oxyCODONE-acetaminophen (PERCOCET/ROXICET) 5-325 MG tablet Take 1-2 tablets by mouth every 4 (four) hours as needed for moderate pain. Qty: 30 tablet, Refills: 0    polyethylene glycol (MIRALAX / GLYCOLAX) packet Take 17 g by mouth daily as needed for mild constipation. Qty: 14 each, Refills: 0    predniSONE (DELTASONE) 20 MG tablet Take 3 tablets twice daily for 5 days, then take 2 tablets twice daily for 5 days, then take 2 tablets once daily for 5 days, then take 1 tablet once daily for 5 days and the STOP. Qty: 65 tablet, Refills: 0       Follow-up Information    GRAVES,JOHN L, MD. Schedule an appointment as soon as possible for a visit in 10 day(s).   Specialty:  Orthopedic Surgery Why:  If you are unable to get in to see Dr. Amil Amen. Contact information: [place] [address] [phone]        Hennie Duos, MD Follow up in 2 week(s).   Specialty:  Rheumatology Contact information: [place] [address] [place] Follow up on 02/01/2016.   Why:  9 am for  hospital f/u Contact information: [place] [address] [phone]          TOTAL DISCHARGE TIME: 55 minutes  [facility] Pager [phone]  01/29/2016, 12:50 PM

## 2016-01-29 NOTE — Care Management Note (Signed)
Case Management Note  Patient Details  Name: Jacob Sheffielddrian Lanell Daigneault Jr. MRN: [id number] Date of Birth: December 11, 1990  Subjective/Objective:   Patient is from home with his wife, pta indep.  Patient does not have a pcp, or insurance, but he does have transportation at dc.  NCM scheduled a follow up apt for patient for 9/14 at 9 am.  [facility] is closed today, NCM will ast patient with [facility],  Patient has no other needs.                   Action/Plan:   Expected Discharge Date:                  Expected Discharge Plan:  Home/Self Care  In-House Referral:     Discharge planning Services  CM Consult, [facility]  Post Acute Care Choice:    Choice offered to:     DME Arranged:    DME Agency:     HH Arranged:    HH Agency:     Status of Service:  Completed, signed off  If discussed at [employer] Length of Stay Meetings, dates discussed:    Additional Comments:  Leone Havenaylor, Aireona Torelli Clinton, RN 01/29/2016, 11:30 AM

## 2016-01-29 NOTE — Progress Notes (Signed)
Subjective: The patient reports overall improvement in his right arm/shoulder discomfort. Still has a twinge of pain in his anterior right shoulder. Has improved over the weekend.  Objective: Vital signs in last 24 hours: Temp:  [97.7 F (36.5 C)-98.7 F (37.1 C)] 97.7 F (36.5 C) (09/11 0548) Pulse Rate:  [57-68] 57 (09/11 0548) Resp:  [18] 18 (09/10 1344) BP: (120-124)/(64-72) 120/72 (09/11 0548) SpO2:  [97 %-98 %] 97 % (09/11 0548)  Intake/Output from previous day: 09/10 0701 - 09/11 0700 In: 900 [P.O.:400; I.V.:500] Out: -  Intake/Output this shift: No intake/output data recorded.   Recent Labs  01/28/16 0421  HGB 13.7    Recent Labs  01/28/16 0421  WBC 20.3*  RBC 4.72  HCT 42.3  PLT 395    Recent Labs  01/28/16 0421  NA 137  K 4.3  CL 105  CO2 27  BUN 9  CREATININE 0.78  GLUCOSE 189*  CALCIUM 9.4   No results for input(s): LABPT, INR in the last 72 hours. Right shoulder aspirate Gram stain showed no organisms. Cultures showed no growth 2 days. Right upper extremity exam: Improvement of swelling overall. Full range of motion of the elbow and wrist newly full range of motion of the right shoulder with some discomfort on forward flexion. No localized tenderness. No redness.  Assessment/Plan: Puzzling right upper extremity symptoms. Right shoulder aspiration negative. Plan: Agree with discharge on oral antibiotics as well as oral steroids. He may use his right arm as tolerated. This is a puzzling/complex case. We talked about possible referral to [facility], but he has a good relationship with Dr. Dierdre ForthBeekman here locally. He will follow-up with Dr. Luiz BlareGraves in the office in 10 days. This case has been discussed with Dr. Luiz BlareGraves.   Cinderella Christoffersen G 01/29/2016, 8:53 AM

## 2016-01-31 LAB — AEROBIC/ANAEROBIC CULTURE W GRAM STAIN (SURGICAL/DEEP WOUND)

## 2016-01-31 LAB — AEROBIC/ANAEROBIC CULTURE (SURGICAL/DEEP WOUND): CULTURE: NO GROWTH

## 2016-02-01 ENCOUNTER — Ambulatory Visit: Payer: Self-pay | Attending: Internal Medicine | Admitting: Internal Medicine

## 2016-02-01 VITALS — BP 144/83 | HR 78 | Temp 98.0°F | Resp 16 | Wt 260.8 lb

## 2016-02-01 DIAGNOSIS — Z114 Encounter for screening for human immunodeficiency virus [HIV]: Secondary | ICD-10-CM

## 2016-02-01 DIAGNOSIS — Z87891 Personal history of nicotine dependence: Secondary | ICD-10-CM | POA: Insufficient documentation

## 2016-02-01 DIAGNOSIS — R739 Hyperglycemia, unspecified: Secondary | ICD-10-CM | POA: Insufficient documentation

## 2016-02-01 DIAGNOSIS — L03113 Cellulitis of right upper limb: Secondary | ICD-10-CM | POA: Insufficient documentation

## 2016-02-01 DIAGNOSIS — J40 Bronchitis, not specified as acute or chronic: Secondary | ICD-10-CM | POA: Insufficient documentation

## 2016-02-01 DIAGNOSIS — Z833 Family history of diabetes mellitus: Secondary | ICD-10-CM | POA: Insufficient documentation

## 2016-02-01 DIAGNOSIS — Z8249 Family history of ischemic heart disease and other diseases of the circulatory system: Secondary | ICD-10-CM | POA: Insufficient documentation

## 2016-02-01 DIAGNOSIS — Z87442 Personal history of urinary calculi: Secondary | ICD-10-CM | POA: Insufficient documentation

## 2016-02-01 DIAGNOSIS — R7303 Prediabetes: Secondary | ICD-10-CM | POA: Insufficient documentation

## 2016-02-01 DIAGNOSIS — Z23 Encounter for immunization: Secondary | ICD-10-CM

## 2016-02-01 LAB — BASIC METABOLIC PANEL WITH GFR
BUN: 15 mg/dL (ref 7–25)
CALCIUM: 9.4 mg/dL (ref 8.6–10.3)
CO2: 24 mmol/L (ref 20–31)
Chloride: 101 mmol/L (ref 98–110)
Creat: 0.79 mg/dL (ref 0.60–1.35)
GLUCOSE: 115 mg/dL — AB (ref 65–99)
POTASSIUM: 4.8 mmol/L (ref 3.5–5.3)
Sodium: 135 mmol/L (ref 135–146)

## 2016-02-01 LAB — CBC WITH DIFFERENTIAL/PLATELET
BASOS ABS: 0 {cells}/uL (ref 0–200)
Basophils Relative: 0 %
EOS ABS: 0 {cells}/uL — AB (ref 15–500)
Eosinophils Relative: 0 %
HEMATOCRIT: 45.9 % (ref 38.5–50.0)
HEMOGLOBIN: 15.6 g/dL (ref 13.2–17.1)
LYMPHS ABS: 2379 {cells}/uL (ref 850–3900)
LYMPHS PCT: 13 %
MCH: 29.2 pg (ref 27.0–33.0)
MCHC: 34 g/dL (ref 32.0–36.0)
MCV: 86 fL (ref 80.0–100.0)
MONO ABS: 732 {cells}/uL (ref 200–950)
MPV: 9.8 fL (ref 7.5–12.5)
Monocytes Relative: 4 %
NEUTROS PCT: 83 %
Neutro Abs: 15189 cells/uL — ABNORMAL HIGH (ref 1500–7800)
Platelets: 472 10*3/uL — ABNORMAL HIGH (ref 140–400)
RBC: 5.34 MIL/uL (ref 4.20–5.80)
RDW: 14.1 % (ref 11.0–15.0)
WBC: 18.3 10*3/uL — ABNORMAL HIGH (ref 3.8–10.8)

## 2016-02-01 LAB — POCT GLYCOSYLATED HEMOGLOBIN (HGB A1C): HEMOGLOBIN A1C: 6

## 2016-02-01 MED ORDER — METFORMIN HCL ER 500 MG PO TB24: 500.0000 mg | ORAL_TABLET | Freq: Every day | ORAL | 3 refills | Status: DC

## 2016-02-01 MED FILL — METFORMIN HCL ER 500 MG TAB: 500 | 90 days supply | Qty: 90 | Fill #0

## 2016-02-01 NOTE — Progress Notes (Signed)
Jacob Nixon, is a 25 y.o. male  ZOX:[id number]  [id number]  DOB - 1991/04/27  CC:  Chief Complaint  Patient presents with  . Follow-up       HPI: Jacob Nixon is a 25 y.o. male here today to establish medical care, Right handed, here for hospital f/u for RUE cellulitis, hospitalized 9/3 - 01/29/16 for cellulitis right forearm. Pt was seen by ortho and ID as well, and broad spectrum abx and steroids were initiated. Pt still on all meds.  He states the redness/and swelling all gone, but some tightness in the right shoulder persist.  Denies f/c/n/v/d.  Increase uop since discharge.   He vapes, denies tob. Denies etoh.  Patient has No headache, No chest pain, No abdominal pain - No Nausea, No new weakness tingling or numbness, No Cough - SOB.   His wife and 6wks newborn here w/ him today.   Review of Systems: Per hPI, o/w all systems reviewed and neg.   No Known Allergies Past Medical History:  Diagnosis Date  . Bronchitis   . Cellulitis of right hand - RECURRENT   . Kidney stones   . Kidney stones    Current Outpatient Prescriptions on File Prior to Visit  Medication Sig Dispense Refill  . polyethylene glycol (MIRALAX / GLYCOLAX) packet Take 17 g by mouth daily as needed for mild constipation. 14 each 0  . predniSONE (DELTASONE) 20 MG tablet Take 3 tablets twice daily for 5 days, then take 2 tablets twice daily for 5 days, then take 2 tablets once daily for 5 days, then take 1 tablet once daily for 5 days and the STOP. 65 tablet 0  . alum & mag hydroxide-simeth (MAALOX/MYLANTA) 200-200-20 MG/5ML suspension Take 30 mLs by mouth every 4 (four) hours as needed for indigestion or heartburn. 355 mL 0  . cephALEXin (KEFLEX) 500 MG capsule Take 1 capsule (500 mg total) by mouth every 6 (six) hours. 20 capsule 0  . docusate sodium (COLACE) 100 MG capsule Take 1 capsule (100 mg total) by mouth 2 (two) times daily. 60 capsule 0  . doxycycline (VIBRA-TABS) 100 MG tablet Take 1  tablet (100 mg total) by mouth every 12 (twelve) hours. 10 tablet 0  . omeprazole (PRILOSEC) 20 MG capsule Take 1 capsule (20 mg total) by mouth daily. 30 capsule 0  . oxyCODONE-acetaminophen (PERCOCET/ROXICET) 5-325 MG tablet Take 1-2 tablets by mouth every 4 (four) hours as needed for moderate pain. 30 tablet 0   No current facility-administered medications on file prior to visit.    Family History  Problem Relation Age of Onset  . Diabetes Other   . Hypertension Other    Social History   Social History  . Marital status: Married    Spouse name: N/A  . Number of children: N/A  . Years of education: N/A   Occupational History  . Not on file.   Social History Main Topics  . Smoking status: Former Games developer  . Smokeless tobacco: Never Used     Comment: States he "vapes"  . Alcohol use No     Comment: occasionally   . Drug use: No  . Sexual activity: Not on file   Other Topics Concern  . Not on file   Social History Narrative  . No narrative on file    Objective:   Vitals:   02/01/16 0916  BP: (!) 144/83  Pulse: 78  Resp: 16  Temp: 98 F (36.7 C)    Filed Weights  02/01/16 0916  Weight: 260 lb 12.8 oz (118.3 kg)    BP Readings from Last 3 Encounters:  02/01/16 (!) 144/83  01/29/16 122/75  11/29/15 108/66    Physical Exam: Constitutional: Patient appears well-developed and well-nourished. No distress. AAOx3, obese, pleasant HENT: Normocephalic, atraumatic, External right and left ear normal. Oropharynx is clear and moist.   Eyes: Conjunctivae and EOM are normal. PERRL, no scleral icterus. Neck: Normal ROM. Neck supple. No JVD.  CVS: RRR, S1/S2 +, no murmurs, no gallops, no carotid bruit.  Pulmonary: Effort and breath sounds normal, no stridor, rhonchi, wheezes, rales.  Abdominal: Soft. BS +, obese, no distension, tenderness, rebound or guarding.  Musculoskeletal: Normal range of motion. No edema and no tenderness.   No residual swelling /erythyema noted  on right hand/arm, rom intact. No deficits noted on exam. LE: bilat/ no c/c/e, pulses 2+ bilateral. Lymphadenopathy: No lymphadenopathy noted, cervical Neuro: Alert.  muscle tone coordination wnl. No cranial nerve deficit grossly. Skin: Skin is warm and dry. No rash noted. Not diaphoretic. No erythema. No pallor. Psychiatric: Normal mood and affect. Behavior, judgment, thought content normal.  Lab Results  Component Value Date   WBC 20.3 (H) 01/28/2016   HGB 13.7 01/28/2016   HCT 42.3 01/28/2016   MCV 89.6 01/28/2016   PLT 395 01/28/2016   Lab Results  Component Value Date   CREATININE 0.78 01/28/2016   BUN 9 01/28/2016   NA 137 01/28/2016   K 4.3 01/28/2016   CL 105 01/28/2016   CO2 27 01/28/2016    No results found for: HGBA1C Lipid Panel  No results found for: CHOL, TRIG, HDL, CHOLHDL, VLDL, LDLCALC     Depression screen PHQ 2/9 02/01/2016  Decreased Interest 1  Down, Depressed, Hopeless 1  PHQ - 2 Score 2  Altered sleeping 2  Tired, decreased energy 0  Change in appetite 3  Feeling bad or failure about yourself  1  Trouble concentrating 0  Moving slowly or fidgety/restless 0  Suicidal thoughts 0  PHQ-9 Score 8    Assessment and plan:   1. 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 - f/u ortho and rheum recd, perhaps consider Allergist as well - recd pt use gloves when detailing cars, etc,, may be reacting to chemicals he is working with - CBC with Differential - BASIC METABOLIC PANEL WITH GFR - return to work note for Monday provided.  2. Hyperglycemia w/ prediabetes infor given about low carb diet, exercise  glucophage xr 500 qday, dw s/es etc, but given longacting, doubt will have much S/Es - HgB A1c 6  3. Flu vaccine need -  Flu Vaccine QUAD 36+ mos PF IM (Fluarix & Fluzone Quad PF)  4. Encounter for screening for HIV - HIV antibody (with reflex)   Return in about 3 months (around 05/02/2016) for dm.  The patient was given clear instructions to go to ER or return to medical center if symptoms don't improve, worsen or new problems develop. The patient verbalized understanding. The patient was told to call to get lab results if they haven't heard anything in the next week.    This note has been created with Education officer, environmentalDragon speech recognition software and smart phrase technology. Any transcriptional errors are unintentional.   Pete Glatterawn T Burna Atlas, MD,  MBA/MHA [facility] [place], [place] [phone]   02/01/2016, 9:35 AM

## 2016-02-01 NOTE — Patient Instructions (Addendum)
Financial aid packet.  Health Maintenance, Male A healthy lifestyle and preventative care can promote health and wellness.  Maintain regular health, dental, and eye exams.  Eat a healthy diet. Foods like vegetables, fruits, whole grains, low-fat dairy products, and lean protein foods contain the nutrients you need and are low in calories. Decrease your intake of foods high in solid fats, added sugars, and salt. Get information about a proper diet from your health care provider, if necessary.  Regular physical exercise is one of the most important things you can do for your health. Most adults should get at least 150 minutes of moderate-intensity exercise (any activity that increases your heart rate and causes you to sweat) each week. In addition, most adults need muscle-strengthening exercises on 2 or more days a week.   Maintain a healthy weight. The body mass index (BMI) is a screening tool to identify possible weight problems. It provides an estimate of body fat based on height and weight. Your health care provider can find your BMI and can help you achieve or maintain a healthy weight. For males 20 years and older:  A BMI below 18.5 is considered underweight.  A BMI of 18.5 to 24.9 is normal.  A BMI of 25 to 29.9 is considered overweight.  A BMI of 30 and above is considered obese.  Maintain normal blood lipids and cholesterol by exercising and minimizing your intake of saturated fat. Eat a balanced diet with plenty of fruits and vegetables. Blood tests for lipids and cholesterol should begin at age 9 and be repeated every 5 years. If your lipid or cholesterol levels are high, you are over age 33, or you are at high risk for heart disease, you may need your cholesterol levels checked more frequently.Ongoing high lipid and cholesterol levels should be treated with medicines if diet and exercise are not working.  If you smoke, find out from your health care provider how to quit. If you do  not use tobacco, do not start.  Lung cancer screening is recommended for adults aged 55-80 years who are at high risk for developing lung cancer because of a history of smoking. A yearly low-dose CT scan of the lungs is recommended for people who have at least a 30-pack-year history of smoking and are current smokers or have quit within the past 15 years. A pack year of smoking is smoking an average of 1 pack of cigarettes a day for 1 year (for example, a 30-pack-year history of smoking could mean smoking 1 pack a day for 30 years or 2 packs a day for 15 years). Yearly screening should continue until the smoker has stopped smoking for at least 15 years. Yearly screening should be stopped for people who develop a health problem that would prevent them from having lung cancer treatment.  If you choose to drink alcohol, do not have more than 2 drinks per day. One drink is considered to be 12 oz (360 mL) of beer, 5 oz (150 mL) of wine, or 1.5 oz (45 mL) of liquor.  Avoid the use of street drugs. Do not share needles with anyone. Ask for help if you need support or instructions about stopping the use of drugs.  High blood pressure causes heart disease and increases the risk of stroke. High blood pressure is more likely to develop in:  People who have blood pressure in the end of the normal range (100-139/85-89 mm Hg).  People who are overweight or obese.  People who  are African American.  If you are 20-25 years of age, have your blood pressure checked every 3-5 years. If you are 50 years of age or older, have your blood pressure checked every year. You should have your blood pressure measured twice--once when you are at a hospital or clinic, and once when you are not at a hospital or clinic. Record the average of the two measurements. To check your blood pressure when you are not at a hospital or clinic, you can use:  An automated blood pressure machine at a pharmacy.  A home blood pressure  monitor.  If you are 25-16 years old, ask your health care provider if you should take aspirin to prevent heart disease.  Diabetes screening involves taking a blood sample to check your fasting blood sugar level. This should be done once every 3 years after age 58 if you are at a normal weight and without risk factors for diabetes. Testing should be considered at a younger age or be carried out more frequently if you are overweight and have at least 1 risk factor for diabetes.  Colorectal cancer can be detected and often prevented. Most routine colorectal cancer screening begins at the age of 1 and continues through age 36. However, your health care provider may recommend screening at an earlier age if you have risk factors for colon cancer. On a yearly basis, your health care provider may provide home test kits to check for hidden blood in the stool. A small camera at the end of a tube may be used to directly examine the colon (sigmoidoscopy or colonoscopy) to detect the earliest forms of colorectal cancer. Talk to your health care provider about this at age 65 when routine screening begins. A direct exam of the colon should be repeated every 5-10 years through age 71, unless early forms of precancerous polyps or small growths are found.  People who are at an increased risk for hepatitis B should be screened for this virus. You are considered at high risk for hepatitis B if:  You were born in a country where hepatitis B occurs often. Talk with your health care provider about which countries are considered high risk.  Your parents were born in a high-risk country and you have not received a shot to protect against hepatitis B (hepatitis B vaccine).  You have HIV or AIDS.  You use needles to inject street drugs.  You live with, or have sex with, someone who has hepatitis B.  You are a man who has sex with other men (MSM).  You get hemodialysis treatment.  You take certain medicines for  conditions like cancer, organ transplantation, and autoimmune conditions.  Hepatitis C blood testing is recommended for all people born from [birth year] through 1966 and any individual with known risk factors for hepatitis C.  Healthy men should no longer receive prostate-specific antigen (PSA) blood tests as part of routine cancer screening. Talk to your health care provider about prostate cancer screening.  Testicular cancer screening is not recommended for adolescents or adult males who have no symptoms. Screening includes self-exam, a health care provider exam, and other screening tests. Consult with your health care provider about any symptoms you have or any concerns you have about testicular cancer.  Practice safe sex. Use condoms and avoid high-risk sexual practices to reduce the spread of sexually transmitted infections (STIs).  You should be screened for STIs, including gonorrhea and chlamydia if:  You are sexually active and are younger than  24 years.  You are older than 24 years, and your health care provider tells you that you are at risk for this type of infection.  Your sexual activity has changed since you were last screened, and you are at an increased risk for chlamydia or gonorrhea. Ask your health care provider if you are at risk.  If you are at risk of being infected with HIV, it is recommended that you take a prescription medicine daily to prevent HIV infection. This is called pre-exposure prophylaxis (PrEP). You are considered at risk if:  You are a man who has sex with other men (MSM).  You are a heterosexual man who is sexually active with multiple partners.  You take drugs by injection.  You are sexually active with a partner who has HIV.  Talk with your health care provider about whether you are at high risk of being infected with HIV. If you choose to begin PrEP, you should first be tested for HIV. You should then be tested every 3 months for as long as you are taking  PrEP.  Use sunscreen. Apply sunscreen liberally and repeatedly throughout the day. You should seek shade when your shadow is shorter than you. Protect yourself by wearing long sleeves, pants, a wide-brimmed hat, and sunglasses year round whenever you are outdoors.  Tell your health care provider of new moles or changes in moles, especially if there is a change in shape or color. Also, tell your health care provider if a mole is larger than the size of a pencil eraser.  A one-time screening for abdominal aortic aneurysm (AAA) and surgical repair of large AAAs by ultrasound is recommended for men aged 65-75 years who are current or former smokers.  Stay current with your vaccines (immunizations).   This information is not intended to replace advice given to you by your health care provider. Make sure you discuss any questions you have with your health care provider.   Document Released: 11/02/2007 Document Revised: 05/27/2014 Document Reviewed: 10/01/2010 Elsevier Interactive Patient Education 2016 Elsevier Inc.  Influenza Virus Vaccine injection (Fluarix) What is this medicine? INFLUENZA VIRUS VACCINE (in floo EN zuh VAHY ruhs vak SEEN) helps to reduce the risk of getting influenza also known as the flu. This medicine may be used for other purposes; ask your health care provider or pharmacist if you have questions. What should I tell my health care provider before I take this medicine? They need to know if you have any of these conditions: -bleeding disorder like hemophilia -fever or infection -Guillain-Barre syndrome or other neurological problems -immune system problems -infection with the human immunodeficiency virus (HIV) or AIDS -low blood platelet counts -multiple sclerosis -an unusual or allergic reaction to influenza virus vaccine, eggs, chicken proteins, latex, gentamicin, other medicines, foods, dyes or preservatives -pregnant or trying to get pregnant -breast-feeding How  should I use this medicine? This vaccine is for injection into a muscle. It is given by a health care professional. A copy of Vaccine Information Statements will be given before each vaccination. Read this sheet carefully each time. The sheet may change frequently. Talk to your pediatrician regarding the use of this medicine in children. Special care may be needed. Overdosage: If you think you have taken too much of this medicine contact a poison control center or emergency room at once. NOTE: This medicine is only for you. Do not share this medicine with others. What if I miss a dose? This does not apply. What may interact  with this medicine? -chemotherapy or radiation therapy -medicines that lower your immune system like etanercept, anakinra, infliximab, and adalimumab -medicines that treat or prevent blood clots like warfarin -phenytoin -steroid medicines like prednisone or cortisone -theophylline -vaccines This list may not describe all possible interactions. Give your health care provider a list of all the medicines, herbs, non-prescription drugs, or dietary supplements you use. Also tell them if you smoke, drink alcohol, or use illegal drugs. Some items may interact with your medicine. What should I watch for while using this medicine? Report any side effects that do not go away within 3 days to your doctor or health care professional. Call your health care provider if any unusual symptoms occur within 6 weeks of receiving this vaccine. You may still catch the flu, but the illness is not usually as bad. You cannot get the flu from the vaccine. The vaccine will not protect against colds or other illnesses that may cause fever. The vaccine is needed every year. What side effects may I notice from receiving this medicine? Side effects that you should report to your doctor or health care professional as soon as possible: -allergic reactions like skin rash, itching or hives, swelling of the  face, lips, or tongue Side effects that usually do not require medical attention (report to your doctor or health care professional if they continue or are bothersome): -fever -headache -muscle aches and pains -pain, tenderness, redness, or swelling at site where injected -weak or tired This list may not describe all possible side effects. Call your doctor for medical advice about side effects. You may report side effects to FDA at 1-800-FDA-1088. Where should I keep my medicine? This vaccine is only given in a clinic, pharmacy, doctor's office, or other health care setting and will not be stored at home. NOTE: This sheet is a summary. It may not cover all possible information. If you have questions about this medicine, talk to your doctor, pharmacist, or health care provider.    2016, Elsevier/Gold Standard. (2007-12-02 09:30:40)

## 2016-02-01 NOTE — Progress Notes (Signed)
Pt is in the office today for a ED follow up Pt states he is not in any pain but he is sore

## 2016-02-02 LAB — HIV ANTIBODY (ROUTINE TESTING W REFLEX): HIV 1&2 Ab, 4th Generation: NONREACTIVE

## 2016-02-08 ENCOUNTER — Telehealth: Payer: Self-pay

## 2016-02-08 NOTE — Telephone Encounter (Signed)
Returned pt call pt didn't answer and was unable to lvm

## 2016-02-08 NOTE — Telephone Encounter (Signed)
Contacted pt to go over lab results pt didn't answer lvm asking pt to give me a call at his earliest convenience  

## 2016-02-09 ENCOUNTER — Telehealth: Payer: Self-pay

## 2016-02-09 NOTE — Telephone Encounter (Signed)
Contacted pt to go over lab results pt did not answer was unable to lvm will be mailing lab results out today

## 2016-02-19 ENCOUNTER — Emergency Department (HOSPITAL_COMMUNITY)
Admission: EM | Admit: 2016-02-19 | Discharge: 2016-02-19 | Disposition: A | Discharge status: Home / Self Care | Payer: Medicaid Other | Attending: Emergency Medicine | Admitting: Emergency Medicine

## 2016-02-19 ENCOUNTER — Encounter (HOSPITAL_COMMUNITY): Payer: Self-pay

## 2016-02-19 ENCOUNTER — Emergency Department (HOSPITAL_COMMUNITY): Payer: Medicaid Other

## 2016-02-19 ENCOUNTER — Telehealth: Payer: Self-pay | Admitting: Internal Medicine

## 2016-02-19 DIAGNOSIS — M7989 Other specified soft tissue disorders: Secondary | ICD-10-CM | POA: Insufficient documentation

## 2016-02-19 DIAGNOSIS — M25532 Pain in left wrist: Secondary | ICD-10-CM | POA: Diagnosis present

## 2016-02-19 DIAGNOSIS — Z87891 Personal history of nicotine dependence: Secondary | ICD-10-CM | POA: Diagnosis not present

## 2016-02-19 MED ORDER — OXYCODONE-ACETAMINOPHEN 5-325 MG PO TABS
1.0000 | ORAL_TABLET | ORAL | Status: AC | PRN
  Administered 2016-02-19 (×2): 1 via ORAL
  Filled 2016-02-19 (×2): qty 1

## 2016-02-19 MED ORDER — OXYCODONE-ACETAMINOPHEN 5-325 MG PO TABS: 1.0000 | ORAL_TABLET | Freq: Four times a day (QID) | ORAL | 0 refills | Status: DC | PRN

## 2016-02-19 MED ORDER — KETOROLAC TROMETHAMINE 60 MG/2ML IM SOLN
60.0000 mg | Freq: Once | INTRAMUSCULAR | Status: AC
  Administered 2016-02-19: 60 mg via INTRAMUSCULAR
  Filled 2016-02-19: qty 2

## 2016-02-19 MED ORDER — PREDNISONE 50 MG PO TABS: 50.0000 mg | ORAL_TABLET | Freq: Every day | ORAL | 0 refills | Status: DC

## 2016-02-19 MED ORDER — MORPHINE SULFATE (PF) 4 MG/ML IV SOLN
6.0000 mg | Freq: Once | INTRAVENOUS | Status: AC
  Administered 2016-02-19: 6 mg via INTRAMUSCULAR
  Filled 2016-02-19: qty 2

## 2016-02-19 NOTE — ED Triage Notes (Signed)
PT C/O NUMBNESS AND TINGLING TO THE LEFT HAND THAT STARTED ON Saturday. PT STS Sunday, THE NUMBNESS AND TINGLING GOT WORSE. TODAY, BEGAN THE SWELLING AND INABILITY TO CLOSE HIS HAND. PT STS HE HAS NO FEELING TO THE FINGERS. PT WAS TX AT [place] 3 WEEKS AGO FOR CELLULITIS TO THE RIGHT HAND AND ARM.

## 2016-02-19 NOTE — Telephone Encounter (Signed)
Pt calling stating that his left hand is swollen and numb Pt states he is in pain, saying it feeling like pins and needles stinging him constantly  Pt was set up with an appointment Thursday 9/5 with Marylene LandAngela but is requesting to speak to nurse or PCP on advice on what to take in the meantime to help with pain   Please f/u thank you

## 2016-02-19 NOTE — ED Notes (Signed)
Patient was alert, oriented and stable upon discharge. RN went over AVS and patient had no further questions.  

## 2016-02-19 NOTE — Telephone Encounter (Signed)
Will to Dr. Julien NordmannLangeland

## 2016-02-19 NOTE — Discharge Instructions (Signed)
Return here as needed.  Follow-up with the orthopedist provided.  Ice and elevate your hand along with heat

## 2016-02-20 NOTE — Telephone Encounter (Signed)
Looks like pt went to ED soon after call. Please make sure he has fu appt w/ us. thanks

## 2016-02-20 NOTE — Telephone Encounter (Signed)
Contacted pt to see how he is feeling and to schedule an follow up appointment with Dr. Julien NordmannLangeland

## 2016-02-22 ENCOUNTER — Ambulatory Visit: Payer: Self-pay

## 2016-02-24 NOTE — ED Provider Notes (Signed)
[facility] Provider Note   CSN: [id number] Arrival date & time: 02/19/16  2037     History   Chief Complaint Chief Complaint  Patient presents with  . Hand Problem    HPI Jacob Nixon. is a 25 y.o. male.  HPI  Patient presents to the emergency department with left wrist pain that started 2 days ago.  The patient states that the wrist pain has gotten worse over the last several days.  Patient states that movement and palpation make the pain worse.  He states he does not recall any specific injury to that wrist.  He states that he took some over-the-counter medications and did not seem to help this pain.  The patient states that he has had previous issues with his right wrist with carpal tunnel and this feels similar Past Medical History:  Diagnosis Date  . Bronchitis   . Cellulitis of right hand - RECURRENT   . Kidney stones   . Kidney stones     Patient Active Problem List   Diagnosis Date Noted  . Abscess of upper arm   . Cellulitis 01/22/2016  . Leukocytosis 01/22/2016  . Neuropathy (HCC) 01/22/2016  . Nephrolithiasis 01/22/2016  . Cellulitis of right upper extremity   . Cellulitis of forearm, right 07/30/2015    Past Surgical History:  Procedure Laterality Date  . HAND SURGERY    . KIDNEY STONE SURGERY     lithortripsy       Home Medications    Prior to Admission medications   Medication Sig Start Date End Date Taking? Authorizing Provider  alum & mag hydroxide-simeth (MAALOX/MYLANTA) 200-200-20 MG/5ML suspension Take 30 mLs by mouth every 4 (four) hours as needed for indigestion or heartburn. 01/29/16   Osvaldo Shipper, MD  cephALEXin (KEFLEX) 500 MG capsule Take 1 capsule (500 mg total) by mouth every 6 (six) hours. 01/29/16   Osvaldo Shipper, MD  docusate sodium (COLACE) 100 MG capsule Take 1 capsule (100 mg total) by mouth 2 (two) times daily. 01/29/16   Osvaldo Shipper, MD  doxycycline (VIBRA-TABS) 100 MG tablet Take 1 tablet (100 mg  total) by mouth every 12 (twelve) hours. 01/29/16   Osvaldo Shipper, MD  metFORMIN (GLUCOPHAGE XR) 500 MG 24 hr tablet Take 1 tablet (500 mg total) by mouth daily with breakfast. 02/01/16   Pete Glatter, MD  omeprazole (PRILOSEC) 20 MG capsule Take 1 capsule (20 mg total) by mouth daily. 01/29/16   Osvaldo Shipper, MD  oxyCODONE-acetaminophen (PERCOCET/ROXICET) 5-325 MG tablet Take 1 tablet by mouth every 6 (six) hours as needed for severe pain. 02/19/16   Auda Finfrock, PA-C  polyethylene glycol (MIRALAX / GLYCOLAX) packet Take 17 g by mouth daily as needed for mild constipation. 01/29/16   Osvaldo Shipper, MD  predniSONE (DELTASONE) 50 MG tablet Take 1 tablet (50 mg total) by mouth daily. 02/19/16   Charlestine Night, PA-C    Family History Family History  Problem Relation Age of Onset  . Diabetes Other   . Hypertension Other     Social History Social History  Substance Use Topics  . Smoking status: Former Games developer  . Smokeless tobacco: Never Used     Comment: States he "vapes"  . Alcohol use No     Comment: occasionally      Allergies   Review of patient's allergies indicates no known allergies.   Review of Systems Review of Systems All other systems negative except as documented in the HPI. All pertinent  positives and negatives as reviewed in the HPI.  Physical Exam Updated Vital Signs BP 127/71 (BP Location: Right Arm)   Pulse 100   Temp 98.2 F (36.8 C) (Oral)   Resp 20   Ht 5\' 11"  (1.803 m)   Wt 117 kg   SpO2 98%   BMI 35.98 kg/m   Physical Exam  Constitutional: He is oriented to person, place, and time. He appears well-developed and well-nourished. No distress.  HENT:  Head: Normocephalic and atraumatic.  Musculoskeletal:       Left wrist: He exhibits tenderness. He exhibits normal range of motion, no bony tenderness, no swelling, no effusion and no deformity.  Neurological: He is alert and oriented to person, place, and time.     ED Treatments /  Results  Labs (all labs ordered are listed, but only abnormal results are displayed) Labs Reviewed - No data to display  EKG  EKG Interpretation None       Radiology No results found.  Procedures Procedures (including critical care time)  Medications Ordered in ED Medications  oxyCODONE-acetaminophen (PERCOCET/ROXICET) 5-325 MG per tablet 1 tablet (1 tablet Oral Given 02/19/16 2153)  morphine 4 MG/ML injection 6 mg (6 mg Intramuscular Given 02/19/16 2310)  ketorolac (TORADOL) injection 60 mg (60 mg Intramuscular Given 02/19/16 2310)     Initial Impression / Assessment and Plan / ED Course  I have reviewed the triage vital signs and the nursing notes.  Pertinent labs & imaging results that were available during my care of the patient were reviewed by me and considered in my medical decision making (see chart for details).  Clinical Course    Patient be referred to hand surgery placed in a wrist splint, told to elevate his rest and ice over the area.  Patient agrees the plan and all questions were answered  Final Clinical Impressions(s) / ED Diagnoses   Final diagnoses:  Swelling of left hand    New Prescriptions Discharge Medication List as of 02/19/2016 11:19 PM       Charlestine Nighthristopher Ruhama Lehew, PA-C 02/24/16 1632    Mancel BaleElliott Wentz, MD 02/25/16 1056

## 2016-02-26 ENCOUNTER — Ambulatory Visit (INDEPENDENT_AMBULATORY_CARE_PROVIDER_SITE_OTHER): Payer: Medicaid Other | Admitting: Orthopedic Surgery

## 2016-02-26 DIAGNOSIS — G5602 Carpal tunnel syndrome, left upper limb: Secondary | ICD-10-CM | POA: Diagnosis not present

## 2016-03-08 ENCOUNTER — Encounter (INDEPENDENT_AMBULATORY_CARE_PROVIDER_SITE_OTHER): Payer: MEDICAID | Admitting: Physical Medicine and Rehabilitation

## 2016-03-12 ENCOUNTER — Ambulatory Visit (INDEPENDENT_AMBULATORY_CARE_PROVIDER_SITE_OTHER): Payer: Medicaid Other | Admitting: Physical Medicine and Rehabilitation

## 2016-03-12 ENCOUNTER — Encounter (INDEPENDENT_AMBULATORY_CARE_PROVIDER_SITE_OTHER): Payer: Self-pay | Admitting: Physical Medicine and Rehabilitation

## 2016-03-12 DIAGNOSIS — M79641 Pain in right hand: Secondary | ICD-10-CM

## 2016-03-12 DIAGNOSIS — M79642 Pain in left hand: Secondary | ICD-10-CM

## 2016-03-12 DIAGNOSIS — R202 Paresthesia of skin: Secondary | ICD-10-CM

## 2016-03-12 DIAGNOSIS — M7989 Other specified soft tissue disorders: Secondary | ICD-10-CM

## 2016-03-12 NOTE — Procedures (Signed)
Impression: The above electrodiagnostic study is ABNORMAL and reveals evidence of a severe left median nerve entrapment at the wrist (carpal tunnel syndrome) affecting sensory and motor components. The lesion is characterized by sensory and motor demyelination with evidence of axonal injury. There is no significant electrodiagnostic evidence of any other focal nerve entrapment, brachial plexopathy or generalized peripheral neuropathy.   Recommendations: 1.  Follow-up with referring physician. 2.  Continue current management of symptoms. 3.  Continue use of resting splint at night-time and as needed during the day. 4.  Suggest surgical evaluation.

## 2016-03-12 NOTE — Progress Notes (Signed)
Office Visit Note  Patient: Jacob Nixon.           Date of Birth: Oct 03, 1990           MRN: [id number] Visit Date: 03/12/2016              Requested by: No referring provider defined for this encounter. PCP: No PCP Per Patient   Assessment & Plan: Visit Diagnoses:  1. Paresthesia of skin   2. Pain in left hand   3. Pain in right hand   4. Swelling of right hand    This patient has severe left median nerve entrapment and damage. There is some axonal damage. There is some atrophy of the left APB. He clearly is going to need a decompression release of the carpal tunnel on the left. He is to follow th Dr. Lajoyce Corners. I am not sure why he is having swelling on the right hand. He doesn't really meet all the qualifications for acomplex regional pain syndrome. His nerves in the right hand in terms of what we can test looked fairly normal. He did have a very mild distal slowing of the sensory nerve but normal motor nerves and normal function.   Follow-Up Instructions: Appointment scheduled with Dr. Lajoyce Corners for review electrodiagnostic studies.  Orders:  Orders Placed This Encounter  Procedures  . NCV with EMG (electromyography)    No orders of the defined types were placed in this encounter.     Procedures: Impression: The above electrodiagnostic study is ABNORMAL and reveals evidence of a severe left median nerve entrapment at the wrist (carpal tunnel syndrome) affecting sensory and motor components. The lesion is characterized by sensory and motor demyelination with evidence of axonal injury. There is no significant electrodiagnostic evidence of any other focal nerve entrapment, brachial plexopathy or generalized peripheral neuropathy.   Recommendations: 1.  Follow-up with referring physician. 2.  Continue current management of symptoms. 3.  Continue use of resting splint at night-time and as needed during the day. 4.  Suggest surgical evaluation.   Clinical Data: No  additional findings.   Subjective: Chief Complaint  Patient presents with  . Left Hand - Numbness, Pain  . Right Hand - Pain   Lt hand-N&T hand into fingers x 41month. Weakness and loss of strength. Not a lot of pain.   Right hand-swelling and pain off and on since [facility] in 2014.  No lotion  Jacob Nixon is a 25 year old gentleman with prior severe right carpal tunnel syndrome which was fairly acute at that time a few years ago. I completed two prior electrodiagnostic studies on the right hand at that time. He underwent decompression surgery of the right carpal tunnel and ultimately had some reinnervation of that nerve. Unfortunately, he has since developed a lot of swelling in the dorsum of the right hand. He states he's been in the emergency room and actually hospitalized with antibiotics for the right hand but nothing as ever been actually found that was infected. He began having symptoms on the left side with numbness tingling and paresthesias into the median nerve distribution on the left. He recognizes some similar symptoms to before so and have it checked out. He had previously seen Dr. August Saucer and Dr. Roda Shutters for the right hand but ultimately came back to see Dr. Lajoyce Corners for the left hand and possibly reevaluating the right hand. The left hand is numb and tingly in the first 3 digits and half of the radial side of the  fourth digit. He is having a lot of difficulty with manipulating small objects. He has weakness.   Review of Systems  Constitutional: Negative for chills, fatigue, fever and unexpected weight change.  HENT: Negative for sore throat and trouble swallowing.   Eyes: Negative for photophobia and visual disturbance.  Respiratory: Negative for chest tightness and shortness of breath.   Cardiovascular: Negative for chest pain.  Gastrointestinal: Negative for abdominal pain.  Endocrine: Negative for cold intolerance and heat intolerance.  Musculoskeletal: Negative for myalgias.  Skin:  Negative for color change and rash.  Neurological: Negative for speech difficulty and headaches.  Psychiatric/Behavioral: Negative for confusion. The patient is not nervous/anxious.      Objective: Vital Signs: There were no vitals taken for this visit.   Physical Exam  Ortho Exam Inspection reveals no atrophy of the right APB or FDI or hand intrinsics but there is flattening of the left APB. There is no color changes, allodynia or dystrophic changes bilaterally but there is swelling on the dorsal aspect of the right hand. There is no erythema. There is intact sensation to light touch in the right hand with some mild decreased sensation in the median nerve distribution. In the left hand there is profound numbness and decreased sensation in the median nerve distribution on the left.  There is a positive Phalen's test on the left. There is a negative Hoffmann's test bilaterally.  Specialty Comments:  No specialty comments available. Imaging: No results found.   PMFS History: Patient Active Problem List   Diagnosis Date Noted  . Abscess of upper arm   . Cellulitis 01/22/2016  . Leukocytosis 01/22/2016  . Neuropathy (HCC) 01/22/2016  . Nephrolithiasis 01/22/2016  . Cellulitis of right upper extremity   . Cellulitis of forearm, right 07/30/2015   Past Medical History:  Diagnosis Date  . Bronchitis   . Cellulitis of right hand - RECURRENT   . Kidney stones   . Kidney stones     Family History  Problem Relation Age of Onset  . Diabetes Other   . Hypertension Other    Past Surgical History:  Procedure Laterality Date  . HAND SURGERY    . KIDNEY STONE SURGERY     lithortripsy   Social History   Occupational History  . Not on file.   Social History Main Topics  . Smoking status: Former Games developermoker  . Smokeless tobacco: Never Used     Comment: States he "vapes"  . Alcohol use No     Comment: occasionally   . Drug use: No  . Sexual activity: Not on file

## 2016-03-26 ENCOUNTER — Encounter (INDEPENDENT_AMBULATORY_CARE_PROVIDER_SITE_OTHER): Payer: Self-pay | Admitting: Orthopedic Surgery

## 2016-03-26 ENCOUNTER — Ambulatory Visit (INDEPENDENT_AMBULATORY_CARE_PROVIDER_SITE_OTHER): Payer: Medicaid Other | Admitting: Orthopedic Surgery

## 2016-03-26 VITALS — Ht 71.0 in | Wt 255.0 lb

## 2016-03-26 DIAGNOSIS — G5602 Carpal tunnel syndrome, left upper limb: Secondary | ICD-10-CM | POA: Diagnosis not present

## 2016-03-26 NOTE — Progress Notes (Signed)
   Office Visit Note   Patient: Jacob Sheffielddrian Lanell Groseclose Jr.           Date of Birth: August 19, 1990           MRN: [id number] Visit Date: 03/26/2016              Requested by: No referring provider defined for this encounter. PCP: No PCP Per Patient   Assessment & Plan: Visit Diagnoses:  1. Carpal tunnel syndrome, left upper limb     Plan: Plan for carpal tunnel release on the left. Patient's right carpal tunnel release was performed by Dr. August Saucerean and we'll have patient follow-up with Dr. August Saucerean for carpal tunnel release on the left.  Follow-Up Instructions: Return if symptoms worsen or fail to improve, for Follow-up postoperatively after her carpal tunnel surgery.   Orders:  No orders of the defined types were placed in this encounter.  No orders of the defined types were placed in this encounter.     Procedures: No procedures performed   Clinical Data: No additional findings.   Subjective: Chief Complaint  Patient presents with  . Right Wrist - Pain  . Left Wrist - Pain    Patient is here for follow up bilateral wrist. He is status post bilateral nerve conduction studies. He has bilateral carpal tunnel syndrome bilaterally. The left is worse than the right. Per patient report Dr. Alvester MorinNewton recommends carpal tunnel surgery for left wrist. He is ready to proceed with surgical intervention.    Review of Systems   Objective: Vital Signs: Ht 5\' 11"  (1.803 m)   Wt 255 lb (115.7 kg)   BMI 35.57 kg/m   Physical Exam Patient alert oriented no adenopathy well-dressed normal affect normal historian effort. Examination he has extreme weakness with grip strength on the left compared to the right. Patient has pain with Phalen's and Tinel's testing. Patient complains of numbness in the thumb index and long finger. Carpal tunnel nerve conduction studies performed by Dr. Alvester MorinNewton showed severe carpal tunnel syndrome. Patient's right carpal tunnel has been released by Dr. August Saucerean and we will set  patient up with Dr. August Saucerean for surgical intervention for the left carpal tunnel release.  Ortho Exam  Specialty Comments:  No specialty comments available.  Imaging: No results found.   PMFS History: Patient Active Problem List   Diagnosis Date Noted  . Abscess of upper arm   . Cellulitis 01/22/2016  . Leukocytosis 01/22/2016  . Neuropathy (HCC) 01/22/2016  . Nephrolithiasis 01/22/2016  . Cellulitis of right upper extremity   . Cellulitis of forearm, right 07/30/2015   Past Medical History:  Diagnosis Date  . Bronchitis   . Cellulitis of right hand - RECURRENT   . Kidney stones   . Kidney stones     Family History  Problem Relation Age of Onset  . Diabetes Other   . Hypertension Other     Past Surgical History:  Procedure Laterality Date  . HAND SURGERY    . KIDNEY STONE SURGERY     lithortripsy   Social History   Occupational History  . Not on file.   Social History Main Topics  . Smoking status: Former Games developermoker  . Smokeless tobacco: Never Used     Comment: States he "vapes"  . Alcohol use No     Comment: occasionally   . Drug use: No  . Sexual activity: Not on file

## 2016-03-27 NOTE — Progress Notes (Signed)
Patient alert oriented no adenopathy well-dressed normal affect normal historian effort. Examination he has extreme weakness with grip strength on the left compared to the right. Patient has pain with Phalen's and Tinel's testing. Patient complains of numbness in the thumb index and long finger. Carpal tunnel nerve conduction studies performed by Dr. Alvester MorinNewton showed severe carpal tunnel syndrome. Patient's right carpal tunnel has been released by Dr. August Saucerean and we will set patient up with Dr. August Saucerean for surgical intervention for the left carpal tunnel release.

## 2016-04-05 ENCOUNTER — Other Ambulatory Visit (INDEPENDENT_AMBULATORY_CARE_PROVIDER_SITE_OTHER): Payer: Self-pay | Admitting: Orthopedic Surgery

## 2016-04-05 DIAGNOSIS — G5602 Carpal tunnel syndrome, left upper limb: Secondary | ICD-10-CM

## 2016-04-08 NOTE — Progress Notes (Signed)
Pre-op instructions given only;please complete pt assessment on DOS. Pt denies SOB, chest pain, and being under the care of a cardiologist. Pt denies having a stress test, echo and cardiac cath. Pt does not have a glucometer to check blood glucose. Pt made aware to not take diabetes pills the morning of surgery such as Metformin. Pt made aware to stop taking  Aspirin, vitamins, fish oil and herbal medications. Do not take any NSAIDs ie: Ibuprofen, Advil, Naproxen, BC and Goody Powder or any medication containing Aspirin. Pt verbalized understanding of all pre-op instructions.

## 2016-04-08 NOTE — H&P (Signed)
Jacob MorasAdrian L Dike Jr. is an 25 y.o. male.   Chief Complaint: Left wrist numbness and tingling HPI: Jacob Nixon is a 25 year old patient with left wrist numbness and tingling.  He is been seen by Dr. Lajoyce Cornersuda and requests carpal tunnel release like he had on the right-hand side several years ago.  He's had a good result from that surgery.  He describes similar symptoms of pain and numbness tingling loss of dexterity in the left wrist that he had in the right.  Past Medical History:  Diagnosis Date  . Bronchitis   . Cellulitis of right hand - RECURRENT   . Kidney stones   . Kidney stones     Past Surgical History:  Procedure Laterality Date  . HAND SURGERY    . KIDNEY STONE SURGERY     lithortripsy    Family History  Problem Relation Age of Onset  . Diabetes Other   . Hypertension Other    Social History:  reports that he has quit smoking. He has never used smokeless tobacco. He reports that he does not drink alcohol or use drugs.  Allergies:  Allergies  Allergen Reactions  . No Known Allergies     No prescriptions prior to admission.    No results found for this or any previous visit (from the past 48 hour(s)). No results found.  ROS All systems reviewed are negative as they relate to the chief complaint within the history of present illness.  Patient denies  fevers or chills.   There were no vitals taken for this visit. Physical Exam on exam the patient is well-developed well-nourished distress alert and oriented respiratory effort normal heart rate normal does report paresthesias in the median distribution.  Negative Tinel's cubital tunnel in the elbow.  Wrist range of motion otherwise full.  Strength is symmetrically pretty normal  Assessment/Plan Impression is carpal tunnel syndrome.  Plan is carpal tunnel release.  Risks and benefits discussed with the patient including not limited to infection or vessel damage incomplete pain relief as well as potential for revision surgery in  the future all questions answered  Burnard BuntingG Scott Maxyne Derocher, MD 04/08/2016, 6:34 PM

## 2016-04-09 ENCOUNTER — Ambulatory Visit (HOSPITAL_COMMUNITY): Payer: Medicaid Other | Admitting: Anesthesiology

## 2016-04-09 ENCOUNTER — Ambulatory Visit (HOSPITAL_COMMUNITY)
Admission: RE | Admit: 2016-04-09 | Discharge: 2016-04-09 | Disposition: A | Discharge status: Home / Self Care | Payer: Medicaid Other | Source: Ambulatory Visit | Attending: Orthopedic Surgery | Admitting: Orthopedic Surgery

## 2016-04-09 ENCOUNTER — Encounter (HOSPITAL_COMMUNITY): Payer: Self-pay | Admitting: Surgery

## 2016-04-09 ENCOUNTER — Encounter (HOSPITAL_COMMUNITY)
Admission: RE | Disposition: A | Discharge status: Home / Self Care | Payer: Self-pay | Source: Ambulatory Visit | Attending: Orthopedic Surgery

## 2016-04-09 DIAGNOSIS — Z833 Family history of diabetes mellitus: Secondary | ICD-10-CM | POA: Insufficient documentation

## 2016-04-09 DIAGNOSIS — Z87442 Personal history of urinary calculi: Secondary | ICD-10-CM | POA: Insufficient documentation

## 2016-04-09 DIAGNOSIS — G5602 Carpal tunnel syndrome, left upper limb: Secondary | ICD-10-CM | POA: Insufficient documentation

## 2016-04-09 DIAGNOSIS — Z87891 Personal history of nicotine dependence: Secondary | ICD-10-CM | POA: Insufficient documentation

## 2016-04-09 DIAGNOSIS — Z8249 Family history of ischemic heart disease and other diseases of the circulatory system: Secondary | ICD-10-CM | POA: Insufficient documentation

## 2016-04-09 HISTORY — PX: CARPAL TUNNEL RELEASE: SHX101

## 2016-04-09 LAB — BASIC METABOLIC PANEL
ANION GAP: 8 (ref 5–15)
BUN: 10 mg/dL (ref 6–20)
CHLORIDE: 108 mmol/L (ref 101–111)
CO2: 23 mmol/L (ref 22–32)
Calcium: 9 mg/dL (ref 8.9–10.3)
Creatinine, Ser: 0.83 mg/dL (ref 0.61–1.24)
GFR calc Af Amer: >60 mL/min (ref >60)
GLUCOSE: 91 mg/dL (ref 65–99)
POTASSIUM: 4 mmol/L (ref 3.5–5.1)
SODIUM: 139 mmol/L (ref 135–145)

## 2016-04-09 LAB — GLUCOSE, CAPILLARY
GLUCOSE-CAPILLARY: 120 mg/dL — AB (ref 65–99)
GLUCOSE-CAPILLARY: 84 mg/dL (ref 65–99)

## 2016-04-09 SURGERY — CARPAL TUNNEL RELEASE
Anesthesia: General | Site: Wrist | Laterality: Left

## 2016-04-09 MED ORDER — MIDAZOLAM HCL 5 MG/5ML IJ SOLN
INTRAMUSCULAR | Status: DC | PRN
  Administered 2016-04-09: 2 mg via INTRAVENOUS

## 2016-04-09 MED ORDER — CHLORHEXIDINE GLUCONATE 4 % EX LIQD: 60.0000 mL | Freq: Once | CUTANEOUS | Status: DC

## 2016-04-09 MED ORDER — LIDOCAINE 2% (20 MG/ML) 5 ML SYRINGE
INTRAMUSCULAR | Status: AC
  Filled 2016-04-09: qty 5

## 2016-04-09 MED ORDER — ONDANSETRON HCL 4 MG/2ML IJ SOLN
INTRAMUSCULAR | Status: DC | PRN
  Administered 2016-04-09: 4 mg via INTRAVENOUS

## 2016-04-09 MED ORDER — ONDANSETRON HCL 4 MG/2ML IJ SOLN: 4.0000 mg | Freq: Once | INTRAMUSCULAR | Status: DC | PRN

## 2016-04-09 MED ORDER — BUPIVACAINE HCL (PF) 0.25 % IJ SOLN
INTRAMUSCULAR | Status: AC
  Filled 2016-04-09: qty 30

## 2016-04-09 MED ORDER — 0.9 % SODIUM CHLORIDE (POUR BTL) OPTIME
TOPICAL | Status: DC | PRN
  Administered 2016-04-09: 1000 mL

## 2016-04-09 MED ORDER — LACTATED RINGERS IV SOLN
INTRAVENOUS | Status: DC
  Administered 2016-04-09 (×2): via INTRAVENOUS

## 2016-04-09 MED ORDER — MIDAZOLAM HCL 2 MG/2ML IJ SOLN
INTRAMUSCULAR | Status: AC
  Filled 2016-04-09: qty 2

## 2016-04-09 MED ORDER — PROPOFOL 10 MG/ML IV BOLUS
INTRAVENOUS | Status: AC
  Filled 2016-04-09: qty 20

## 2016-04-09 MED ORDER — OXYCODONE HCL 5 MG PO TABS
ORAL_TABLET | ORAL | Status: AC
  Filled 2016-04-09: qty 1

## 2016-04-09 MED ORDER — FENTANYL CITRATE (PF) 100 MCG/2ML IJ SOLN
INTRAMUSCULAR | Status: DC | PRN
  Administered 2016-04-09: 50 ug via INTRAVENOUS

## 2016-04-09 MED ORDER — OXYCODONE HCL 5 MG/5ML PO SOLN: 5.0000 mg | Freq: Once | ORAL | Status: AC | PRN

## 2016-04-09 MED ORDER — OXYCODONE HCL 5 MG PO TABS
5.0000 mg | ORAL_TABLET | Freq: Once | ORAL | Status: AC | PRN
  Administered 2016-04-09: 5 mg via ORAL

## 2016-04-09 MED ORDER — FENTANYL CITRATE (PF) 100 MCG/2ML IJ SOLN
INTRAMUSCULAR | Status: AC
  Filled 2016-04-09: qty 2

## 2016-04-09 MED ORDER — CEFAZOLIN SODIUM-DEXTROSE 2-4 GM/100ML-% IV SOLN
2.0000 g | INTRAVENOUS | Status: AC
  Administered 2016-04-09: 2 g via INTRAVENOUS

## 2016-04-09 MED ORDER — FENTANYL CITRATE (PF) 100 MCG/2ML IJ SOLN
INTRAMUSCULAR | Status: AC
Start: 2016-04-09 — End: 2016-04-09
  Filled 2016-04-09: qty 2

## 2016-04-09 MED ORDER — PHENYLEPHRINE 40 MCG/ML (10ML) SYRINGE FOR IV PUSH (FOR BLOOD PRESSURE SUPPORT)
PREFILLED_SYRINGE | INTRAVENOUS | Status: AC
  Filled 2016-04-09: qty 10

## 2016-04-09 MED ORDER — PHENYLEPHRINE HCL 10 MG/ML IJ SOLN
INTRAMUSCULAR | Status: DC | PRN
  Administered 2016-04-09 (×2): 80 ug via INTRAVENOUS
  Administered 2016-04-09: 120 ug via INTRAVENOUS
  Administered 2016-04-09: 80 ug via INTRAVENOUS

## 2016-04-09 MED ORDER — FENTANYL CITRATE (PF) 100 MCG/2ML IJ SOLN
25.0000 ug | INTRAMUSCULAR | Status: DC | PRN
  Administered 2016-04-09 (×2): 50 ug via INTRAVENOUS

## 2016-04-09 MED ORDER — BUPIVACAINE HCL (PF) 0.25 % IJ SOLN
INTRAMUSCULAR | Status: DC | PRN
  Administered 2016-04-09: 10 mL

## 2016-04-09 MED ORDER — LIDOCAINE HCL (CARDIAC) 20 MG/ML IV SOLN
INTRAVENOUS | Status: DC | PRN
  Administered 2016-04-09: 50 mg via INTRAVENOUS

## 2016-04-09 MED ORDER — CEFAZOLIN SODIUM-DEXTROSE 2-4 GM/100ML-% IV SOLN
INTRAVENOUS | Status: AC
  Filled 2016-04-09: qty 100

## 2016-04-09 MED ORDER — PROPOFOL 10 MG/ML IV BOLUS
INTRAVENOUS | Status: DC | PRN
  Administered 2016-04-09: 200 mg via INTRAVENOUS

## 2016-04-09 SURGICAL SUPPLY — 54 items
BANDAGE ACE 4X5 VEL STRL LF (GAUZE/BANDAGES/DRESSINGS) ×3 IMPLANT
BANDAGE ELASTIC 3 VELCRO ST LF (GAUZE/BANDAGES/DRESSINGS) ×6 IMPLANT
BANDAGE ELASTIC 4 VELCRO ST LF (GAUZE/BANDAGES/DRESSINGS) ×2 IMPLANT
BNDG CMPR 9X4 STRL LF SNTH (GAUZE/BANDAGES/DRESSINGS) ×1
BNDG ESMARK 4X9 LF (GAUZE/BANDAGES/DRESSINGS) ×2 IMPLANT
BNDG GAUZE ELAST 4 BULKY (GAUZE/BANDAGES/DRESSINGS) ×3 IMPLANT
CORDS BIPOLAR (ELECTRODE) ×3 IMPLANT
COVER SURGICAL LIGHT HANDLE (MISCELLANEOUS) ×3 IMPLANT
CUFF TOURNIQUET SINGLE 18IN (TOURNIQUET CUFF) ×1 IMPLANT
CUFF TOURNIQUET SINGLE 24IN (TOURNIQUET CUFF) ×2 IMPLANT
DRAPE SURG 17X23 STRL (DRAPES) ×3 IMPLANT
DURAPREP 26ML APPLICATOR (WOUND CARE) ×3 IMPLANT
GAUZE SPONGE 4X4 12PLY STRL (GAUZE/BANDAGES/DRESSINGS) ×3 IMPLANT
GAUZE XEROFORM 1X8 LF (GAUZE/BANDAGES/DRESSINGS) ×3 IMPLANT
GLOVE BIOGEL PI IND STRL 6.5 (GLOVE) IMPLANT
GLOVE BIOGEL PI IND STRL 7.5 (GLOVE) IMPLANT
GLOVE BIOGEL PI IND STRL 8 (GLOVE) ×1 IMPLANT
GLOVE BIOGEL PI INDICATOR 6.5 (GLOVE) ×2
GLOVE BIOGEL PI INDICATOR 7.5 (GLOVE) ×2
GLOVE BIOGEL PI INDICATOR 8 (GLOVE) ×2
GLOVE ECLIPSE 7.0 STRL STRAW (GLOVE) ×2 IMPLANT
GLOVE SURG ORTHO 8.0 STRL STRW (GLOVE) ×3 IMPLANT
GLOVE SURG SS PI 6.5 STRL IVOR (GLOVE) ×2 IMPLANT
GOWN STRL REUS W/ TWL LRG LVL3 (GOWN DISPOSABLE) ×2 IMPLANT
GOWN STRL REUS W/ TWL XL LVL3 (GOWN DISPOSABLE) ×1 IMPLANT
GOWN STRL REUS W/TWL LRG LVL3 (GOWN DISPOSABLE) ×6
GOWN STRL REUS W/TWL XL LVL3 (GOWN DISPOSABLE) ×3
KIT BASIN OR (CUSTOM PROCEDURE TRAY) ×3 IMPLANT
KIT ROOM TURNOVER OR (KITS) ×3 IMPLANT
LOOP VESSEL MAXI BLUE (MISCELLANEOUS) IMPLANT
NDL HYPO 25GX1X1/2 BEV (NEEDLE) IMPLANT
NEEDLE HYPO 25GX1X1/2 BEV (NEEDLE) ×3 IMPLANT
NS IRRIG 1000ML POUR BTL (IV SOLUTION) ×3 IMPLANT
PACK ORTHO EXTREMITY (CUSTOM PROCEDURE TRAY) ×3 IMPLANT
PAD ABD 8X10 STRL (GAUZE/BANDAGES/DRESSINGS) ×2 IMPLANT
PAD ARMBOARD 7.5X6 YLW CONV (MISCELLANEOUS) ×6 IMPLANT
PAD CAST 4YDX4 CTTN HI CHSV (CAST SUPPLIES) ×2 IMPLANT
PADDING CAST ABS 4INX4YD NS (CAST SUPPLIES) ×2
PADDING CAST ABS COTTON 4X4 ST (CAST SUPPLIES) IMPLANT
PADDING CAST COTTON 4X4 STRL (CAST SUPPLIES) ×6
SPONGE GAUZE 4X4 12PLY STER LF (GAUZE/BANDAGES/DRESSINGS) ×2 IMPLANT
SUCTION FRAZIER HANDLE 10FR (MISCELLANEOUS)
SUCTION TUBE FRAZIER 10FR DISP (MISCELLANEOUS) IMPLANT
SUT ETHILON 3 0 PS 1 (SUTURE) ×3 IMPLANT
SUT VIC AB 2-0 CT1 27 (SUTURE) ×3
SUT VIC AB 2-0 CT1 TAPERPNT 27 (SUTURE) IMPLANT
SUT VIC AB 3-0 FS2 27 (SUTURE) IMPLANT
SYR CONTROL 10ML LL (SYRINGE) ×2 IMPLANT
SYSTEM CHEST DRAIN TLS 7FR (DRAIN) IMPLANT
TOWEL OR 17X24 6PK STRL BLUE (TOWEL DISPOSABLE) ×3 IMPLANT
TOWEL OR 17X26 10 PK STRL BLUE (TOWEL DISPOSABLE) ×3 IMPLANT
TUBE CONNECTING 12'X1/4 (SUCTIONS) ×1
TUBE CONNECTING 12X1/4 (SUCTIONS) ×1 IMPLANT
UNDERPAD 30X30 (UNDERPADS AND DIAPERS) ×3 IMPLANT

## 2016-04-09 NOTE — Anesthesia Preprocedure Evaluation (Addendum)
Anesthesia Evaluation  Patient identified by MRN, date of birth, ID band Patient awake    Reviewed: Allergy & Precautions, NPO status , Patient's Chart, lab work & pertinent test results  Airway Mallampati: II  TM Distance: >3 FB Neck ROM: Full    Dental  (+) Poor Dentition, Loose, Dental Advisory Given,    Pulmonary former smoker,    breath sounds clear to auscultation       Cardiovascular  Rhythm:Regular Rate:Normal     Neuro/Psych    GI/Hepatic   Endo/Other    Renal/GU      Musculoskeletal   Abdominal   Peds  Hematology   Anesthesia Other Findings Breaks in upper two front teeth right middle, not loose.  Broken and slightly loose molar upper left midway back.  Full beard.  Reproductive/Obstetrics                            Anesthesia Physical Anesthesia Plan  ASA: II  Anesthesia Plan: General   Post-op Pain Management:    Induction: Intravenous  Airway Management Planned: LMA  Additional Equipment:   Intra-op Plan:   Post-operative Plan:   Informed Consent: I have reviewed the patients History and Physical, chart, labs and discussed the procedure including the risks, benefits and alternatives for the proposed anesthesia with the patient or authorized representative who has indicated his/her understanding and acceptance.   Dental advisory given  Plan Discussed with: CRNA and Anesthesiologist  Anesthesia Plan Comments:         Anesthesia Quick Evaluation

## 2016-04-09 NOTE — Brief Op Note (Signed)
04/09/2016  1:29 PM  PATIENT:  Jacob MorasAdrian L Snellings Jr.  25 y.o. male  PRE-OPERATIVE DIAGNOSIS:  LEFT CARPAL TUNNEL SYNDROME  POST-OPERATIVE DIAGNOSIS:  LEFT CARPAL TUNNEL SYNDROME  PROCEDURE:  Procedure(s): CARPAL TUNNEL RELEASE  SURGEON:  Surgeon(s): Cammy CopaScott Rodricus Candelaria, MD  ASSISTANT: Patrick Jupitercarla Bethune rnfa  ANESTHESIA:   general  EBL: 3 ml    Total I/O In: 1000 [I.V.:1000] Out: -   BLOOD ADMINISTERED: none  DRAINS: none   LOCAL MEDICATIONS USED:  Plain marcaine  SPECIMEN:  No Specimen  COUNTS:  YES  TOURNIQUET:  18 min at 250 mm hg  DICTATION: .Other Dictation: Dictation Number [id number]  PLAN OF CARE: Discharge to home after PACU  PATIENT DISPOSITION:  PACU - hemodynamically stable

## 2016-04-09 NOTE — Transfer of Care (Signed)
Immediate Anesthesia Transfer of Care Note  Patient: Jacob Morasdrian L Mascari Jr.  Procedure(s) Performed: Procedure(s): CARPAL TUNNEL RELEASE (Left)  Patient Location: PACU  Anesthesia Type:General  Level of Consciousness: awake, alert  and patient cooperative  Airway & Oxygen Therapy: Patient Spontanous Breathing and Patient connected to nasal cannula oxygen  Post-op Assessment: Report given to RN, Post -op Vital signs reviewed and stable and Patient moving all extremities  Post vital signs: Reviewed and stable  Last Vitals:  Vitals:   04/09/16 0806 04/09/16 0850  BP: 109/74 115/63  Pulse: 72   Resp: 20 20  Temp: 36.7 C 36.4 C    Last Pain:  Vitals:   04/09/16 0850  TempSrc: Oral  PainSc:       Patients Stated Pain Goal: 4 (04/09/16 0834)  Complications: No apparent anesthesia complications

## 2016-04-09 NOTE — Anesthesia Postprocedure Evaluation (Signed)
Anesthesia Post Note  Patient: Jacob Morasdrian L Klemens Jr.  Procedure(s) Performed: Procedure(s) (LRB): CARPAL TUNNEL RELEASE (Left)  Patient location during evaluation: PACU Anesthesia Type: General Level of consciousness: awake, awake and alert and oriented Pain management: pain level controlled Vital Signs Assessment: post-procedure vital signs reviewed and stable Respiratory status: nonlabored ventilation and respiratory function stable Cardiovascular status: blood pressure returned to baseline Anesthetic complications: no    Last Vitals:  Vitals:   04/09/16 1433 04/09/16 1440  BP:  102/89  Pulse:  65  Resp:  14  Temp: 36.4 C     Last Pain:  Vitals:   04/09/16 1415  TempSrc:   PainSc: 8                  Onisha Cedeno COKER

## 2016-04-09 NOTE — Anesthesia Procedure Notes (Signed)
Procedure Name: LMA Insertion Date/Time: 04/09/2016 12:36 PM Performed by: Sharlene DoryWALKER, Jacob Mckiver E Pre-anesthesia Checklist: Patient identified, Emergency Drugs available, Suction available and Patient being monitored Patient Re-evaluated:Patient Re-evaluated prior to inductionOxygen Delivery Method: Circle system utilized Preoxygenation: Pre-oxygenation with 100% oxygen Intubation Type: IV induction LMA: LMA inserted LMA Size: 4.0 Number of attempts: 1 Placement Confirmation: positive ETCO2 and breath sounds checked- equal and bilateral Tube secured with: Tape Dental Injury: Teeth and Oropharynx as per pre-operative assessment

## 2016-04-09 NOTE — Interval H&P Note (Signed)
History and Physical Interval Note:  04/09/2016 12:02 PM  Jacob MorasAdrian L Eissler Jr.  has presented today for surgery, with the diagnosis of LEFT CARPAL TUNNEL SYNDROME  The various methods of treatment have been discussed with the patient and family. After consideration of risks, benefits and other options for treatment, the patient has consented to  Procedure(s): CARPAL TUNNEL RELEASE (Left) as a surgical intervention .  The patient's history has been reviewed, patient examined, no change in status, stable for surgery.  I have reviewed the patient's chart and labs.  Questions were answered to the patient's satisfaction.     Burnard BuntingG Scott Justino Boze

## 2016-04-10 ENCOUNTER — Encounter (HOSPITAL_COMMUNITY): Payer: Self-pay | Admitting: Orthopedic Surgery

## 2016-04-10 NOTE — Op Note (Signed)
NAME:  [name], Jacob Nixon                ACCOUNT NO.:  [id number]  MEDICAL RECORD NO.:  [id number]  LOCATION:  PERIO                        FACILITY:  [facility]  PHYSICIAN:  Burnard BuntingG. Scott Eustace Hur, M.D.    DATE OF BIRTH:  [birth year]  DATE OF PROCEDURE: DATE OF DISCHARGE:                              OPERATIVE REPORT   PREOPERATIVE DIAGNOSIS:  Left carpal tunnel syndrome.  POSTOPERATIVE DIAGNOSIS:  Left carpal tunnel syndrome.  PROCEDURE:  Left carpal tunnel release.  SURGEON:  Burnard BuntingG. Scott Ashey Tramontana, M.D.  ASSISTANT:  Patrick Jupiterarla Bethune, RNFA.  INDICATIONS:  Jacob Nixon is a 25 year old patient with left carpal tunnel syndrome, had good result with right carpal tunnel release years ago.  He works as a Sales executivecar detailer and reports pain, numbness and tingling in digits 1, 2 and 3.  Previous nerve conduction study positive for carpal tunnel syndrome.  PROCEDURE IN DETAIL:  The patient was brought to the operating room where general anesthetic was induced.  Preoperative IV antibiotics were administered.  Time-out was called.  Left arm was prescrubbed with alcohol and Betadine, which was allowed to air dry, then prepped with DuraPrep solution and draped in a sterile manner.  Time-out was called. Left arm was elevated and exsanguinated with Esmarch wrap.  Tourniquet was inflated.  Incision was made at the intersection of Kaplan's cardinal line on the radial border of the fourth finger.  Skin and subcutaneous tissue were sharply divided down to the proximal wrist flexion crease.  Palmar fascia was encountered and divided.  The palmaris brevis was then encountered, which was overlying the transverse carpal ligament.  Transverse carpal ligament was visualized and divided in its middle portion 1-2 mm longitudinally.  Right angle retractor was then placed between the median nerve and the undersurface of the transverse carpal ligament.  Transverse carpal ligament was then released distally along its length under direct  visualization and proximally to the forearm fascia.  Thorough irrigation was then performed.  Skin edge was anesthetized with Marcaine.  Tourniquet released.  Bleeding points were encountered and controlled using bipolar electrocautery.  Skin was closed using 3-0 nylon simple sutures and placed in a bulky splint.  The patient tolerated the procedure well without immediate complications.  Motor branch was inspected and found to be intact prior to closure.     Burnard BuntingG. Scott Simone Tuckey, M.D.    GSD/MEDQ  D:  04/09/2016  T:  04/10/2016  Job:  [id number]

## 2016-04-14 ENCOUNTER — Encounter (HOSPITAL_COMMUNITY): Payer: Self-pay

## 2016-04-14 ENCOUNTER — Emergency Department (HOSPITAL_COMMUNITY)
Admission: EM | Admit: 2016-04-14 | Discharge: 2016-04-15 | Disposition: A | Discharge status: Home / Self Care | Payer: Medicaid Other | Attending: Emergency Medicine | Admitting: Emergency Medicine

## 2016-04-14 DIAGNOSIS — G8918 Other acute postprocedural pain: Secondary | ICD-10-CM | POA: Diagnosis not present

## 2016-04-14 DIAGNOSIS — Z87891 Personal history of nicotine dependence: Secondary | ICD-10-CM | POA: Diagnosis not present

## 2016-04-14 DIAGNOSIS — M25532 Pain in left wrist: Secondary | ICD-10-CM | POA: Diagnosis present

## 2016-04-14 MED ORDER — OXYCODONE-ACETAMINOPHEN 5-325 MG PO TABS
1.0000 | ORAL_TABLET | Freq: Once | ORAL | Status: AC
  Administered 2016-04-15: 1 via ORAL
  Filled 2016-04-14: qty 1

## 2016-04-14 MED ORDER — KETOROLAC TROMETHAMINE 30 MG/ML IJ SOLN
30.0000 mg | Freq: Once | INTRAMUSCULAR | Status: AC
  Administered 2016-04-15: 30 mg via INTRAMUSCULAR
  Filled 2016-04-14: qty 1

## 2016-04-14 NOTE — ED Provider Notes (Signed)
[facility]EMERGENCY DEPT Provider Note   CSN: [id number] Arrival date & time: 04/14/16  2201  By signing my name below, I, Jacob Nixon, attest that this documentation has been prepared under the direction and in the presence of physician practitioner, Shon Batonourtney F Horton, MD. Electronically Signed: Linna Darnerussell Nixon, Scribe. 04/14/2016. 11:45 PM.  History   Chief Complaint Chief Complaint  Patient presents with  . Carpal Tunnel    The history is provided by the patient. No language interpreter was used.     HPI Comments: Jacob Morasdrian L Knezevic Jr. is a 25 y.o. male who presents to the Emergency Department complaining of constant, severe, gradually worsening, left wrist pain beginning two days ago. Pt had carpal tunnel release surgery on his left wrist 5 days ago and has worn a splint since. He was prescribed hydrocodone (x1 every 4 hours) for pain and swelling; he states the hydrocodone worked for his pain for the first few days post-op, but his pain presented two days ago and has worsened since. No other medications or treatments tried. He states he cannot sleep due to his left wrist pain. He notes his left middle finger, left index finger, and left thumb have been tingling since the surgery. He denies new trauma or injury to his left wrist. Pt has a follow up appointment with the surgeon, Dr. August Saucerean, in 3 days. No known allergies to medications. He denies fever, chills, new left wrist swelling, or any other associated symptoms.  Past Medical History:  Diagnosis Date  . Bronchitis   . Cellulitis of right hand - RECURRENT   . Kidney stones   . Kidney stones     Patient Active Problem List   Diagnosis Date Noted  . Abscess of upper arm   . Cellulitis 01/22/2016  . Leukocytosis 01/22/2016  . Neuropathy (HCC) 01/22/2016  . Nephrolithiasis 01/22/2016  . Cellulitis of right upper extremity   . Cellulitis of forearm, right 07/30/2015    Past Surgical History:  Procedure Laterality Date  . CARPAL  TUNNEL RELEASE Left 04/09/2016   Procedure: CARPAL TUNNEL RELEASE;  Surgeon: Cammy CopaScott Gregory [name], MD;  [facility];  Service: Orthopedics;  Laterality: Left;  . HAND SURGERY    . KIDNEY STONE SURGERY     lithortripsy       Home Medications    Prior to Admission medications   Medication Sig Start Date End Date Taking? Authorizing Provider  metFORMIN (GLUCOPHAGE XR) 500 MG 24 hr tablet Take 1 tablet (500 mg total) by mouth daily with breakfast. 02/01/16   Pete Glatterawn T Langeland, MD  naproxen (NAPROSYN) 500 MG tablet Take 1 tablet (500 mg total) by mouth 2 (two) times daily. 04/15/16   Shon Batonourtney F Horton, MD  oxyCODONE-acetaminophen (PERCOCET/ROXICET) 5-325 MG tablet Take 1-2 tablets by mouth every 6 (six) hours as needed for severe pain. 04/15/16   Shon Batonourtney F Horton, MD    Family History Family History  Problem Relation Age of Onset  . Diabetes Other   . Hypertension Other     Social History Social History  Substance Use Topics  . Smoking status: Former Games developermoker  . Smokeless tobacco: Never Used     Comment: States he "vapes"  . Alcohol use No     Comment: occasionally      Allergies   No known allergies   Review of Systems Review of Systems  Constitutional: Negative for chills and fever.  Musculoskeletal: Positive for arthralgias (left wrist). Negative for joint swelling.  Skin: Negative for wound.  Neurological: Positive for numbness (left middle finger, index finger, and thumb).  All other systems reviewed and are negative.    Physical Exam Updated Vital Signs BP 124/76   Pulse 96   Temp 98.2 F (36.8 C) (Oral)   Resp 16   SpO2 97%   Physical Exam  Constitutional: He is oriented to person, place, and time. He appears well-developed and well-nourished.  overweight  HENT:  Head: Normocephalic and atraumatic.  Cardiovascular: Normal rate and regular rhythm.   Pulmonary/Chest: Effort normal. No respiratory distress.  Musculoskeletal: He exhibits no edema.    Decreased ROM left wrist, no significant swelling, good cap refill, incision over the palm is clean dry and intact, noted adjacent erythema or drainage, neurovascular intact, compartment soft  Neurological: He is alert and oriented to person, place, and time.  Skin: Skin is warm and dry.  Psychiatric: He has a normal mood and affect.  Nursing note and vitals reviewed.    ED Treatments / Results  Labs (all labs ordered are listed, but only abnormal results are displayed) Labs Reviewed - No data to display  EKG  EKG Interpretation None       Radiology No results found.  Procedures Procedures (including critical care time)  DIAGNOSTIC STUDIES: Oxygen Saturation is 96% on RA, adequate by my interpretation.    COORDINATION OF CARE: 11:50 PM Discussed treatment plan with pt at bedside and pt agreed to plan.  Medications Ordered in ED Medications  oxyCODONE-acetaminophen (PERCOCET/ROXICET) 5-325 MG per tablet 1 tablet (not administered)  ketorolac (TORADOL) 30 MG/ML injection 30 mg (30 mg Intramuscular Given 04/15/16 0012)  oxyCODONE-acetaminophen (PERCOCET/ROXICET) 5-325 MG per tablet 1 tablet (1 tablet Oral Given 04/15/16 0012)     Initial Impression / Assessment and Plan / ED Course  I have reviewed the triage vital signs and the nursing notes.  Pertinent labs & imaging results that were available during my care of the patient were reviewed by me and considered in my medical decision making (see chart for details).  Clinical Course     Patient presents with pain and postop site. He is postop day 5. Surgical site is clean dry and intact. No significant swelling, no signs of compartment syndrome.  Patient given Percocet and Toradol. Will increase pain medications of Percocet and add naproxen. Follow up with hand surgeon tomorrow.  After history, exam, and medical workup I feel the patient has been appropriately medically screened and is safe for discharge home. Pertinent  diagnoses were discussed with the patient. Patient was given return precautions.  I personally performed the services described in this documentation, which was scribed in my presence. The recorded information has been reviewed and is accurate.  Final Clinical Impressions(s) / ED Diagnoses   Final diagnoses:  Post-operative pain    New Prescriptions New Prescriptions   NAPROXEN (NAPROSYN) 500 MG TABLET    Take 1 tablet (500 mg total) by mouth 2 (two) times daily.   OXYCODONE-ACETAMINOPHEN (PERCOCET/ROXICET) 5-325 MG TABLET    Take 1-2 tablets by mouth every 6 (six) hours as needed for severe pain.     Shon Batonourtney F Horton, MD 04/15/16 0100

## 2016-04-14 NOTE — ED Triage Notes (Signed)
Pt states he had carpel tunnel surgery on Left hand on past Tuesday; pt states he woke up lying on hand and pain increased; pt states pain meds given by Md not working; Pt states pain at 9/10 on arrival; pt a&ox 4

## 2016-04-15 MED ORDER — NAPROXEN 500 MG PO TABS: 500.0000 mg | ORAL_TABLET | Freq: Two times a day (BID) | ORAL | 0 refills | Status: DC

## 2016-04-15 MED ORDER — OXYCODONE-ACETAMINOPHEN 5-325 MG PO TABS
1.0000 | ORAL_TABLET | Freq: Once | ORAL | Status: AC
  Administered 2016-04-15: 1 via ORAL
  Filled 2016-04-15: qty 1

## 2016-04-15 MED ORDER — OXYCODONE-ACETAMINOPHEN 5-325 MG PO TABS: 1.0000 | ORAL_TABLET | Freq: Four times a day (QID) | ORAL | 0 refills | Status: DC | PRN

## 2016-04-15 NOTE — Discharge Instructions (Signed)
Do not take Percocet with hydrocodone. Add naproxen for pain management. Follow-up with your surgeon tomorrow.

## 2016-04-17 ENCOUNTER — Ambulatory Visit (INDEPENDENT_AMBULATORY_CARE_PROVIDER_SITE_OTHER): Payer: Self-pay | Admitting: Orthopedic Surgery

## 2016-04-17 ENCOUNTER — Encounter (INDEPENDENT_AMBULATORY_CARE_PROVIDER_SITE_OTHER): Payer: Self-pay | Admitting: Orthopedic Surgery

## 2016-04-17 DIAGNOSIS — G5602 Carpal tunnel syndrome, left upper limb: Secondary | ICD-10-CM

## 2016-04-17 NOTE — Progress Notes (Signed)
   Post-Op Visit Note   Patient: Jacob Morasdrian L Risse Jr.           Date of Birth: 1990/06/10           MRN: [id number] Visit Date: 04/17/2016 PCP: No PCP Per Patient   Assessment & Plan:  Chief Complaint:  Chief Complaint  Patient presents with  . Left Wrist - Routine Post Op   Visit Diagnoses:  1. Carpal tunnel syndrome on left     Plan: Jacob Nixon is now 8 days out left carpal tunnel release.  Been doing well.  Having some pain on the dorsal aspect of his hand.  On exam incision is intact.  He's had good abductor pollicis brevis strength.  Plan at this time is to remove the sutures Steri-Strip with benzoin soft dressing 3 week return out of work until that time  Follow-Up Instructions: No Follow-up on file.   Orders:  No orders of the defined types were placed in this encounter.  No orders of the defined types were placed in this encounter.    PMFS History: Patient Active Problem List   Diagnosis Date Noted  . Abscess of upper arm   . Cellulitis 01/22/2016  . Leukocytosis 01/22/2016  . Neuropathy (HCC) 01/22/2016  . Nephrolithiasis 01/22/2016  . Cellulitis of right upper extremity   . Cellulitis of forearm, right 07/30/2015   Past Medical History:  Diagnosis Date  . Bronchitis   . Cellulitis of right hand - RECURRENT   . Kidney stones   . Kidney stones     Family History  Problem Relation Age of Onset  . Diabetes Other   . Hypertension Other     Past Surgical History:  Procedure Laterality Date  . CARPAL TUNNEL RELEASE Left 04/09/2016   Procedure: CARPAL TUNNEL RELEASE;  Surgeon: Cammy CopaScott Gregory [name], MD;  [facility];  Service: Orthopedics;  Laterality: Left;  . HAND SURGERY    . KIDNEY STONE SURGERY     lithortripsy   Social History   Occupational History  . Not on file.   Social History Main Topics  . Smoking status: Former Games developermoker  . Smokeless tobacco: Never Used     Comment: States he "vapes"  . Alcohol use No     Comment: occasionally   . Drug  use: No  . Sexual activity: Not on file

## 2016-04-29 ENCOUNTER — Telehealth (INDEPENDENT_AMBULATORY_CARE_PROVIDER_SITE_OTHER): Payer: Self-pay | Admitting: Orthopedic Surgery

## 2016-04-29 NOTE — Telephone Encounter (Signed)
Patient called asked if he can be released to return back to work. Patient advised he will need a work note. The number to contact him is [phone]

## 2016-04-30 NOTE — Telephone Encounter (Signed)
Patient wants to know if he can return to work

## 2016-04-30 NOTE — Telephone Encounter (Signed)
Please advise 

## 2016-04-30 NOTE — Telephone Encounter (Signed)
Advise on what? 

## 2016-05-01 NOTE — Telephone Encounter (Signed)
Need to wait until sutures come out please call thanks

## 2016-05-01 NOTE — Telephone Encounter (Signed)
Left message on machine stating- That he can't return to work till after sutures have been removed.

## 2016-05-08 ENCOUNTER — Encounter (INDEPENDENT_AMBULATORY_CARE_PROVIDER_SITE_OTHER): Payer: Self-pay | Admitting: Orthopedic Surgery

## 2016-05-08 ENCOUNTER — Ambulatory Visit (INDEPENDENT_AMBULATORY_CARE_PROVIDER_SITE_OTHER): Payer: Medicaid Other | Admitting: Orthopedic Surgery

## 2016-05-08 DIAGNOSIS — G5601 Carpal tunnel syndrome, right upper limb: Secondary | ICD-10-CM | POA: Insufficient documentation

## 2016-05-08 DIAGNOSIS — G5602 Carpal tunnel syndrome, left upper limb: Secondary | ICD-10-CM

## 2016-05-08 NOTE — Progress Notes (Signed)
   Post-Op Visit Note   Patient: Jacob Nixon.           Date of Birth: 1991/03/21           MRN: [id number] Visit Date: 05/08/2016 PCP: No PCP Per Patient   Assessment & Plan:  Chief Complaint:  Chief Complaint  Patient presents with  . Left Wrist - Routine Post Op   Visit Diagnoses:  1. Carpal tunnel syndrome on left     Plan: Koleen Nimroddrian is a 25 year old patient 4 weeks out left carpal tunnel release.  He's been doing well.  On exam he has got good grip strength well-healed incision.  Still has some occasional paresthesias digits one 2 but that is slowly improving.  Plan at this time is let him return to work 05/14/2016 follow-up with me as needed  Follow-Up Instructions: Return if symptoms worsen or fail to improve.   Orders:  No orders of the defined types were placed in this encounter.  No orders of the defined types were placed in this encounter.    PMFS History: Patient Active Problem List   Diagnosis Date Noted  . Carpal tunnel syndrome on left 05/08/2016  . Abscess of upper arm   . Cellulitis 01/22/2016  . Leukocytosis 01/22/2016  . Neuropathy (HCC) 01/22/2016  . Nephrolithiasis 01/22/2016  . Cellulitis of right upper extremity   . Cellulitis of forearm, right 07/30/2015   Past Medical History:  Diagnosis Date  . Bronchitis   . Cellulitis of right hand - RECURRENT   . Kidney stones   . Kidney stones     Family History  Problem Relation Age of Onset  . Diabetes Other   . Hypertension Other     Past Surgical History:  Procedure Laterality Date  . CARPAL TUNNEL RELEASE Left 04/09/2016   Procedure: CARPAL TUNNEL RELEASE;  Surgeon: Cammy CopaScott Gregory [name], MD;  [facility];  Service: Orthopedics;  Laterality: Left;  . HAND SURGERY    . KIDNEY STONE SURGERY     lithortripsy   Social History   Occupational History  . Not on file.   Social History Main Topics  . Smoking status: Former Games developermoker  . Smokeless tobacco: Never Used     Comment: States he  "vapes"  . Alcohol use No     Comment: occasionally   . Drug use: No  . Sexual activity: Not on file

## 2016-06-11 ENCOUNTER — Encounter (HOSPITAL_COMMUNITY): Payer: Self-pay | Admitting: Emergency Medicine

## 2016-06-11 ENCOUNTER — Emergency Department (HOSPITAL_COMMUNITY)
Admission: EM | Admit: 2016-06-11 | Discharge: 2016-06-11 | Disposition: A | Discharge status: Home / Self Care | Payer: Medicaid Other | Attending: Emergency Medicine | Admitting: Emergency Medicine

## 2016-06-11 DIAGNOSIS — S4992XA Unspecified injury of left shoulder and upper arm, initial encounter: Secondary | ICD-10-CM | POA: Diagnosis present

## 2016-06-11 DIAGNOSIS — Y929 Unspecified place or not applicable: Secondary | ICD-10-CM | POA: Insufficient documentation

## 2016-06-11 DIAGNOSIS — S61011D Laceration without foreign body of right thumb without damage to nail, subsequent encounter: Secondary | ICD-10-CM

## 2016-06-11 DIAGNOSIS — R531 Weakness: Secondary | ICD-10-CM

## 2016-06-11 DIAGNOSIS — Y999 Unspecified external cause status: Secondary | ICD-10-CM | POA: Diagnosis not present

## 2016-06-11 DIAGNOSIS — Y939 Activity, unspecified: Secondary | ICD-10-CM | POA: Diagnosis not present

## 2016-06-11 DIAGNOSIS — Z87891 Personal history of nicotine dependence: Secondary | ICD-10-CM | POA: Insufficient documentation

## 2016-06-11 DIAGNOSIS — S46812A Strain of other muscles, fascia and tendons at shoulder and upper arm level, left arm, initial encounter: Secondary | ICD-10-CM | POA: Insufficient documentation

## 2016-06-11 DIAGNOSIS — X58XXXA Exposure to other specified factors, initial encounter: Secondary | ICD-10-CM | POA: Diagnosis not present

## 2016-06-11 LAB — CBC WITH DIFFERENTIAL/PLATELET
BASOS ABS: 0 10*3/uL (ref 0.0–0.1)
Basophils Relative: 0 %
EOS PCT: 2 %
Eosinophils Absolute: 0.2 10*3/uL (ref 0.0–0.7)
HCT: 41 % (ref 39.0–52.0)
Hemoglobin: 14.5 g/dL (ref 13.0–17.0)
LYMPHS PCT: 35 %
Lymphs Abs: 4.2 10*3/uL — ABNORMAL HIGH (ref 0.7–4.0)
MCH: 29.8 pg (ref 26.0–34.0)
MCHC: 35.4 g/dL (ref 30.0–36.0)
MCV: 84.4 fL (ref 78.0–100.0)
Monocytes Absolute: 1.1 10*3/uL — ABNORMAL HIGH (ref 0.1–1.0)
Monocytes Relative: 9 %
Neutro Abs: 6.3 10*3/uL (ref 1.7–7.7)
Neutrophils Relative %: 54 %
PLATELETS: 311 10*3/uL (ref 150–400)
RBC: 4.86 MIL/uL (ref 4.22–5.81)
RDW: 13.8 % (ref 11.5–15.5)
WBC: 11.9 10*3/uL — AB (ref 4.0–10.5)

## 2016-06-11 LAB — I-STAT CHEM 8, ED
BUN: 9 mg/dL (ref 6–20)
CHLORIDE: 106 mmol/L (ref 101–111)
Calcium, Ion: 1.19 mmol/L (ref 1.15–1.40)
Creatinine, Ser: 0.9 mg/dL (ref 0.61–1.24)
Glucose, Bld: 106 mg/dL — ABNORMAL HIGH (ref 65–99)
HCT: 44 % (ref 39.0–52.0)
Hemoglobin: 15 g/dL (ref 13.0–17.0)
POTASSIUM: 4 mmol/L (ref 3.5–5.1)
SODIUM: 140 mmol/L (ref 135–145)
TCO2: 23 mmol/L (ref 0–100)

## 2016-06-11 MED ORDER — HYDROCODONE-ACETAMINOPHEN 5-325 MG PO TABS
1.0000 | ORAL_TABLET | Freq: Once | ORAL | Status: AC
  Administered 2016-06-11: 1 via ORAL
  Filled 2016-06-11: qty 1

## 2016-06-11 MED ORDER — METHOCARBAMOL 500 MG PO TABS: 1000.0000 mg | ORAL_TABLET | Freq: Three times a day (TID) | ORAL | 0 refills | Status: DC | PRN

## 2016-06-11 NOTE — ED Provider Notes (Signed)
[facility]-EMERGENCY DEPT Provider Note   CSN: [id number] Arrival date & time: 06/11/16  2110     History   Chief Complaint Chief Complaint  Patient presents with  . Back Pain  . Generalized Body Aches    HPI Ramces Shomaker. is a 26 y.o. male.  HPI Lief Palmatier. is a 26 y.o. male with hx of cellulitis to the right hand with prior hospitalizations, bronchitis, presents to ED with complaint of upper back pain, chills, non healing laceration to right thumb, dizziness, weakness. Patient states that his back pain has been bothering him for about a week. States it is more on the left side, radiating from midline spine into the left shoulder. Pain is worsened with movement. He states he is having trouble sleeping. Patient also has a small laceration that started out as a blister, states he has not been healing over the last several weeks. In addition patient reports feeling some weakness this morning when got getting up out of bed, along with some dizziness. He states over the last week he has had some chills, night sweats. He denies checking his temperature at home. He has not been taking any medications for any his symptoms. He has been applying bacitracin to his laceration on the thumb. He denies any redness or swelling anywhere over his body. He denies any URI symptoms. No other complaints.  Past Medical History:  Diagnosis Date  . Bronchitis   . Cellulitis of right hand - RECURRENT   . Kidney stones   . Kidney stones     Patient Active Problem List   Diagnosis Date Noted  . Carpal tunnel syndrome on left 05/08/2016  . Abscess of upper arm   . Cellulitis 01/22/2016  . Leukocytosis 01/22/2016  . Neuropathy (HCC) 01/22/2016  . Nephrolithiasis 01/22/2016  . Cellulitis of right upper extremity   . Cellulitis of forearm, right 07/30/2015    Past Surgical History:  Procedure Laterality Date  . CARPAL TUNNEL RELEASE Left 04/09/2016   Procedure: CARPAL TUNNEL RELEASE;  Surgeon:  Cammy Copa, MD;  [facility];  Service: Orthopedics;  Laterality: Left;  . HAND SURGERY    . KIDNEY STONE SURGERY     lithortripsy       Home Medications    Prior to Admission medications   Medication Sig Start Date End Date Taking? Authorizing Provider  metFORMIN (GLUCOPHAGE XR) 500 MG 24 hr tablet Take 1 tablet (500 mg total) by mouth daily with breakfast. 02/01/16   Pete Glatter, MD  naproxen (NAPROSYN) 500 MG tablet Take 1 tablet (500 mg total) by mouth 2 (two) times daily. 04/15/16   Shon Baton, MD  oxyCODONE-acetaminophen (PERCOCET/ROXICET) 5-325 MG tablet Take 1-2 tablets by mouth every 6 (six) hours as needed for severe pain. 04/15/16   Shon Baton, MD    Family History Family History  Problem Relation Age of Onset  . Diabetes Other   . Hypertension Other     Social History Social History  Substance Use Topics  . Smoking status: Former Games developer  . Smokeless tobacco: Never Used     Comment: States he "vapes"  . Alcohol use No     Comment: occasionally      Allergies   No known allergies   Review of Systems Review of Systems  Constitutional: Positive for chills and fatigue. Negative for fever.  Respiratory: Negative for cough, chest tightness and shortness of breath.   Cardiovascular: Negative for chest pain,  palpitations and leg swelling.  Gastrointestinal: Negative for abdominal distention, abdominal pain, diarrhea, nausea and vomiting.  Genitourinary: Negative for dysuria, frequency, hematuria and urgency.  Musculoskeletal: Positive for arthralgias and myalgias. Negative for neck pain and neck stiffness.  Skin: Positive for wound. Negative for rash.  Allergic/Immunologic: Negative for immunocompromised state.  Neurological: Positive for light-headedness. Negative for weakness, numbness and headaches.  All other systems reviewed and are negative.    Physical Exam Updated Vital Signs BP 152/79 (BP Location: Left Arm)   Pulse  81   Temp 97.6 F (36.4 C) (Oral)   Resp 18   Ht 5\' 11"  (1.803 m)   Wt 113.4 kg   SpO2 98%   BMI 34.87 kg/m   Physical Exam  Constitutional: He appears well-developed and well-nourished. No distress.  HENT:  Head: Normocephalic and atraumatic.  Eyes: Conjunctivae are normal.  Neck: Neck supple.  Cardiovascular: Normal rate, regular rhythm and normal heart sounds.   Pulmonary/Chest: Effort normal. No respiratory distress. He has no wheezes. He has no rales.  Abdominal: Soft. Bowel sounds are normal. He exhibits no distension. There is no tenderness. There is no rebound.  Musculoskeletal: He exhibits no edema.  Tenderness to palpation over left trapezius from the neck all the way to the shoulder. There is no erythema, swelling, masses, evidence of infection on exam. No skin induration. Full range of motion of the neck and left shoulder. No joint tenderness.  Neurological: He is alert.  Skin: Skin is warm and dry.  Small, less than 1 cm cracked skin to the left thumb over what appears to be a callus. No surrounding erythema, no drainage, no tenderness.  Nursing note and vitals reviewed.    ED Treatments / Results  Labs (all labs ordered are listed, but only abnormal results are displayed) Labs Reviewed  CBC WITH DIFFERENTIAL/PLATELET  I-STAT CHEM 8, ED    EKG  EKG Interpretation None       Radiology No results found.  Procedures Procedures (including critical care time)  Medications Ordered in ED Medications  HYDROcodone-acetaminophen (NORCO/VICODIN) 5-325 MG per tablet 1 tablet (not administered)     Initial Impression / Assessment and Plan / ED Course  I have reviewed the triage vital signs and the nursing notes.  Pertinent labs & imaging results that were available during my care of the patient were reviewed by me and considered in my medical decision making (see chart for details).     Patient in emergency department with left trapezius pain, some  generalized weakness, lightheadedness, small cracked wound to the right thumb with no evidence of infection, patient is very concerned he may have recurrent cellulitis for which he has been admitted a few times. Based on exam, do not see any evidence of cellulitis anywhere on the skin. He does have some tenderness over left trapezius, however there is no skin changes, no skin discoloration, no skin induration or palpable masses or abscesses. This pain has also been there for a week and I would expect to see more on the exam. I suspect he may have injured his trapezius or having muscle spasms. I will treat him with muscle relaxants. I did get blood work for further evaluation of possible infection. Patient will blood cell count is 11.9. There is no elevated neutrophils or left shift. With his prior infections white count was 20. Patient also had a dental extraction today which would elevate his white count slightly. Again I have discussed findings with him, and the  fact that did not see any evidence of cellulitis or infectious process. I however extracted him and his wife to keep our eye on his symptoms and any developing infection. They agreed. Patient's electrolytes, renal function, the rest of the blood work all unremarkable. He'll be discharged home with Robaxin. Continue ibuprofen and he also has some Norco left over at home from his prior carpal tunnel surgery. He will take that as well. Instructed him to follow-up with primary care doctor return if worsening.  Vitals:   06/11/16 2119 06/11/16 2120  BP: 152/79   Pulse: 81   Resp: 18   Temp: 97.6 F (36.4 C)   TempSrc: Oral   SpO2: 98%   Weight:  113.4 kg  Height:  5\' 11"  (1.803 m)     Final Clinical Impressions(s) / ED Diagnoses   Final diagnoses:  Trapezius strain, left, initial encounter  Laceration of right thumb without foreign body without damage to nail, subsequent encounter  Weakness    New Prescriptions New Prescriptions    METHOCARBAMOL (ROBAXIN) 500 MG TABLET    Take 2 tablets (1,000 mg total) by mouth every 8 (eight) hours as needed for muscle spasms.     Jaynie Crumble, PA-C 06/11/16 2300    Mancel Bale, MD 06/12/16 [phone]

## 2016-06-11 NOTE — Discharge Instructions (Signed)
Continue ibuprofen for pain. Continue hydrocodone for severe pain. Take Robaxin as prescribed for spasms. Try heating pads, stretches, gentle massage. Follow-up with the family doctor return to emergency department if not improving. Return if any worsening symptoms.

## 2016-06-11 NOTE — ED Triage Notes (Addendum)
Patient reports upper back pain with "baseball size knot" between the shoulder blades x2 days. Denies injury. Patient also reports concern of blister to right thumb. States he has hx of cellulitis. No swelling, redness, or warmth noted.

## 2016-08-02 ENCOUNTER — Emergency Department (HOSPITAL_BASED_OUTPATIENT_CLINIC_OR_DEPARTMENT_OTHER)
Admission: EM | Admit: 2016-08-02 | Discharge: 2016-08-03 | Disposition: A | Discharge status: Home / Self Care | Payer: Medicaid Other | Attending: Emergency Medicine | Admitting: Emergency Medicine

## 2016-08-02 ENCOUNTER — Encounter (HOSPITAL_BASED_OUTPATIENT_CLINIC_OR_DEPARTMENT_OTHER): Payer: Self-pay | Admitting: Emergency Medicine

## 2016-08-02 DIAGNOSIS — Z87891 Personal history of nicotine dependence: Secondary | ICD-10-CM | POA: Diagnosis not present

## 2016-08-02 DIAGNOSIS — L03113 Cellulitis of right upper limb: Secondary | ICD-10-CM | POA: Diagnosis not present

## 2016-08-02 DIAGNOSIS — M79641 Pain in right hand: Secondary | ICD-10-CM | POA: Diagnosis present

## 2016-08-02 NOTE — ED Triage Notes (Addendum)
Patient states that he started to have pain and swelling to right hand starting this afternoon. Patient states that he has had cellulitis of unknown cause to his right hand in the past. The patient reports that it fells like it is blown up like a balloon

## 2016-08-02 NOTE — ED Notes (Signed)
Right hand dorsal side red small amount of swelling noted.

## 2016-08-03 MED ORDER — IBUPROFEN 800 MG PO TABS: 800.0000 mg | ORAL_TABLET | Freq: Once | ORAL | Status: DC

## 2016-08-03 MED ORDER — IBUPROFEN 800 MG PO TABS: 800.0000 mg | ORAL_TABLET | Freq: Three times a day (TID) | ORAL | 0 refills | Status: DC | PRN

## 2016-08-03 MED ORDER — SULFAMETHOXAZOLE-TRIMETHOPRIM 800-160 MG PO TABS: 1.0000 | ORAL_TABLET | Freq: Once | ORAL | Status: DC

## 2016-08-03 MED ORDER — ONDANSETRON 4 MG PO TBDP: 4.0000 mg | ORAL_TABLET | Freq: Three times a day (TID) | ORAL | 0 refills | Status: DC | PRN

## 2016-08-03 MED ORDER — SULFAMETHOXAZOLE-TRIMETHOPRIM 800-160 MG PO TABS: 1.0000 | ORAL_TABLET | Freq: Two times a day (BID) | ORAL | 0 refills | Status: DC

## 2016-08-03 MED ORDER — TRAMADOL HCL 50 MG PO TABS: 50.0000 mg | ORAL_TABLET | Freq: Four times a day (QID) | ORAL | 0 refills | Status: DC | PRN

## 2016-08-03 NOTE — ED Provider Notes (Signed)
TIME SEEN: 1:39 AM  CHIEF COMPLAINT: Right hand pain  HPI: Patient is a 26 year old male with history of cellulitis of the right hand who is right-hand-dominant who presents emergency department with mild swelling, erythema without warmth to the dorsal right hand for the past day. No injury to the arm. States he has some tingling in the fingers of the right hand but no numbness. No fevers but has had chills and nausea. No vomiting. No history of diabetes, HIV, IV drug abuse. No history of DVT.  ROS: See HPI Constitutional: no fever  Eyes: no drainage  ENT: no runny nose   Cardiovascular:  no chest pain  Resp: no SOB  GI: no vomiting GU: no dysuria Integumentary: no rash  Allergy: no hives  Musculoskeletal: no leg swelling  Neurological: no slurred speech ROS otherwise negative  PAST MEDICAL HISTORY/PAST SURGICAL HISTORY:  Past Medical History:  Diagnosis Date  . Bronchitis   . Cellulitis of right hand - RECURRENT   . Kidney stones   . Kidney stones     MEDICATIONS:  Prior to Admission medications   Medication Sig Start Date End Date Taking? Authorizing Provider  metFORMIN (GLUCOPHAGE XR) 500 MG 24 hr tablet Take 1 tablet (500 mg total) by mouth daily with breakfast. 02/01/16   Pete Glatterawn T Langeland, MD  methocarbamol (ROBAXIN) 500 MG tablet Take 2 tablets (1,000 mg total) by mouth every 8 (eight) hours as needed for muscle spasms. 06/11/16   Tatyana Kirichenko, PA-C  naproxen (NAPROSYN) 500 MG tablet Take 1 tablet (500 mg total) by mouth 2 (two) times daily. 04/15/16   Shon Batonourtney F Horton, MD  oxyCODONE-acetaminophen (PERCOCET/ROXICET) 5-325 MG tablet Take 1-2 tablets by mouth every 6 (six) hours as needed for severe pain. 04/15/16   Shon Batonourtney F Horton, MD    ALLERGIES:  Allergies  Allergen Reactions  . No Known Allergies     SOCIAL HISTORY:  Social History  Substance Use Topics  . Smoking status: Former Games developermoker  . Smokeless tobacco: Never Used     Comment: States he "vapes"  .  Alcohol use No     Comment: occasionally     FAMILY HISTORY: Family History  Problem Relation Age of Onset  . Diabetes Other   . Hypertension Other     EXAM: BP 117/80 (BP Location: Left Arm)   Pulse 73   Temp 98.2 F (36.8 C) (Oral)   Resp 18   Ht 5\' 11"  (1.803 m)   Wt 250 lb (113.4 kg)   SpO2 99%   BMI 34.87 kg/m  CONSTITUTIONAL: Alert and oriented and responds appropriately to questions. Well-appearing; well-nourished, Afebrile, nontoxic, well-hydrated HEAD: Normocephalic EYES: Conjunctivae clear, pupils appear equal, EOMI ENT: normal nose; moist mucous membranes NECK: Supple, no meningismus, no nuchal rigidity, no LAD  CARD: RRR; S1 and S2 appreciated; no murmurs, no clicks, no rubs, no gallops RESP: Normal chest excursion without splinting or tachypnea; breath sounds clear and equal bilaterally; no wheezes, no rhonchi, no rales, no hypoxia or respiratory distress, speaking full sentences ABD/GI: Normal bowel sounds; non-distended; soft, non-tender, no rebound, no guarding, no peritoneal signs, no hepatosplenomegaly BACK:  The back appears normal and is non-tender to palpation, there is no CVA tenderness EXT: Patient has very minimal erythema to the dorsal right hand just proximal to the first and second digits without significant warmth. There is no induration or fluctuance present. He does have a positive Finkelstein's test on the side. No sign of joint effusion and full range  of motion of his fingers and wrists. Compartments in the right arm were soft and he has 2+ radial pulse and extremity is warm and well-perfused. No bony deformity of the right arm. Normal ROM in all joints; otherwise extremities are non-tender to palpation; no edema; normal capillary refill; no cyanosis, no calf tenderness or swelling    SKIN: Normal color for age and race; warm; no rash NEURO: Moves all extremities equally, normal sensation diffusely PSYCH: The patient's mood and manner are appropriate.  Grooming and personal hygiene are appropriate.  MEDICAL DECISION MAKING: Patient here with what could be early cellulitis of the right hand versus de Quervain's tenosynovitis. Nothing at this time to suggest septic arthritis, abscess that needs drainage. He has no systemic symptoms. Neurovascularly intact distally. No compartment syndrome. No history of injury to suggest fracture. No history of DVT and has no proximal swelling or tenderness of the arm. No risk factors to suggest that he would develop a DVT in the arm including recent peripheral IVs, central lines or procedures. He does have PCP follow-up already scheduled for April 2. Have advised him to see if he can move this up sooner. Have recommended NSAIDs for pain control and will discharge with short prescription for tramadol as well. Recommended rest, elevation and ice. Will discharge on Bactrim. At this time I do not feel he needs IV antibiotics, emergent imaging or admission to the hospital. He is comfortable with this plan.  At this time, I do not feel there is any life-threatening condition present. I have reviewed and discussed all results (EKG, imaging, lab, urine as appropriate) and exam findings with patient/family. I have reviewed nursing notes and appropriate previous records.  I feel the patient is safe to be discharged home without further emergent workup and can continue workup as an outpatient as needed. Discussed usual and customary return precautions. Patient/family verbalize understanding and are comfortable with this plan.  Outpatient follow-up has been provided if needed. All questions have been answered.     Layla Maw Briauna Gilmartin, DO 08/03/16 [phone]

## 2016-08-03 NOTE — ED Notes (Signed)
ED Provider at bedside. 

## 2016-08-03 NOTE — ED Notes (Signed)
Pt refused the wait for medication pt has RX for medications and states that he will get his medication filled

## 2016-08-04 ENCOUNTER — Inpatient Hospital Stay (HOSPITAL_COMMUNITY)
Admission: EM | Admit: 2016-08-04 | Discharge: 2016-08-07 | DRG: 603 | Disposition: A | Discharge status: Home / Self Care | Payer: Medicaid Other | Attending: Family Medicine | Admitting: Family Medicine

## 2016-08-04 ENCOUNTER — Encounter (HOSPITAL_COMMUNITY): Payer: Self-pay | Admitting: Emergency Medicine

## 2016-08-04 DIAGNOSIS — L03119 Cellulitis of unspecified part of limb: Secondary | ICD-10-CM | POA: Diagnosis present

## 2016-08-04 DIAGNOSIS — L0390 Cellulitis, unspecified: Secondary | ICD-10-CM

## 2016-08-04 DIAGNOSIS — Z87442 Personal history of urinary calculi: Secondary | ICD-10-CM

## 2016-08-04 DIAGNOSIS — Z87891 Personal history of nicotine dependence: Secondary | ICD-10-CM

## 2016-08-04 DIAGNOSIS — L03113 Cellulitis of right upper limb: Secondary | ICD-10-CM | POA: Diagnosis not present

## 2016-08-04 DIAGNOSIS — Z7984 Long term (current) use of oral hypoglycemic drugs: Secondary | ICD-10-CM

## 2016-08-04 DIAGNOSIS — Z91018 Allergy to other foods: Secondary | ICD-10-CM

## 2016-08-04 HISTORY — DX: Cellulitis of unspecified part of limb: L03.119

## 2016-08-04 LAB — BASIC METABOLIC PANEL
ANION GAP: 8 (ref 5–15)
BUN: 10 mg/dL (ref 6–20)
CHLORIDE: 106 mmol/L (ref 101–111)
CO2: 23 mmol/L (ref 22–32)
Calcium: 9 mg/dL (ref 8.9–10.3)
Creatinine, Ser: 0.9 mg/dL (ref 0.61–1.24)
GFR calc Af Amer: >60 mL/min (ref >60)
GLUCOSE: 119 mg/dL — AB (ref 65–99)
POTASSIUM: 3.9 mmol/L (ref 3.5–5.1)
Sodium: 137 mmol/L (ref 135–145)

## 2016-08-04 LAB — CBC WITH DIFFERENTIAL/PLATELET
Basophils Absolute: 0 10*3/uL (ref 0.0–0.1)
Basophils Relative: 0 %
EOS PCT: 1 %
Eosinophils Absolute: 0.1 10*3/uL (ref 0.0–0.7)
HEMATOCRIT: 41.4 % (ref 39.0–52.0)
Hemoglobin: 14.3 g/dL (ref 13.0–17.0)
LYMPHS ABS: 3 10*3/uL (ref 0.7–4.0)
LYMPHS PCT: 21 %
MCH: 28.9 pg (ref 26.0–34.0)
MCHC: 34.5 g/dL (ref 30.0–36.0)
MCV: 83.6 fL (ref 78.0–100.0)
MONO ABS: 1.3 10*3/uL — AB (ref 0.1–1.0)
MONOS PCT: 9 %
NEUTROS ABS: 9.8 10*3/uL — AB (ref 1.7–7.7)
Neutrophils Relative %: 69 %
PLATELETS: 306 10*3/uL (ref 150–400)
RBC: 4.95 MIL/uL (ref 4.22–5.81)
RDW: 13.5 % (ref 11.5–15.5)
WBC: 14.2 10*3/uL — ABNORMAL HIGH (ref 4.0–10.5)

## 2016-08-04 MED ORDER — PIPERACILLIN-TAZOBACTAM 3.375 G IVPB
3.3750 g | Freq: Three times a day (TID) | INTRAVENOUS | Status: DC
  Administered 2016-08-05 – 2016-08-07 (×7): 3.375 g via INTRAVENOUS
  Filled 2016-08-04 (×10): qty 50

## 2016-08-04 MED ORDER — ACETAMINOPHEN 650 MG RE SUPP: 650.0000 mg | Freq: Four times a day (QID) | RECTAL | Status: DC | PRN

## 2016-08-04 MED ORDER — ONDANSETRON HCL 4 MG/2ML IJ SOLN
4.0000 mg | Freq: Once | INTRAMUSCULAR | Status: AC
  Administered 2016-08-04: 4 mg via INTRAVENOUS
  Filled 2016-08-04: qty 2

## 2016-08-04 MED ORDER — VANCOMYCIN HCL 10 G IV SOLR
2000.0000 mg | Freq: Once | INTRAVENOUS | Status: AC
  Administered 2016-08-04: 2000 mg via INTRAVENOUS
  Filled 2016-08-04: qty 2000

## 2016-08-04 MED ORDER — ACETAMINOPHEN 325 MG PO TABS
650.0000 mg | ORAL_TABLET | Freq: Four times a day (QID) | ORAL | Status: DC | PRN
  Administered 2016-08-05: 650 mg via ORAL
  Filled 2016-08-04: qty 2

## 2016-08-04 MED ORDER — ONDANSETRON HCL 4 MG PO TABS: 4.0000 mg | ORAL_TABLET | Freq: Four times a day (QID) | ORAL | Status: DC | PRN

## 2016-08-04 MED ORDER — PIPERACILLIN-TAZOBACTAM 3.375 G IVPB 30 MIN
3.3750 g | INTRAVENOUS | Status: AC
  Administered 2016-08-04: 3.375 g via INTRAVENOUS
  Filled 2016-08-04: qty 50

## 2016-08-04 MED ORDER — ACETAMINOPHEN 325 MG PO TABS
650.0000 mg | ORAL_TABLET | Freq: Once | ORAL | Status: AC
  Administered 2016-08-04: 650 mg via ORAL
  Filled 2016-08-04: qty 2

## 2016-08-04 MED ORDER — CLINDAMYCIN PHOSPHATE 600 MG/50ML IV SOLN
600.0000 mg | Freq: Once | INTRAVENOUS | Status: AC
  Administered 2016-08-04: 600 mg via INTRAVENOUS
  Filled 2016-08-04: qty 50

## 2016-08-04 MED ORDER — HYDROMORPHONE HCL 1 MG/ML IJ SOLN
1.0000 mg | Freq: Once | INTRAMUSCULAR | Status: AC
  Administered 2016-08-04: 1 mg via INTRAVENOUS
  Filled 2016-08-04: qty 1

## 2016-08-04 MED ORDER — HYDROMORPHONE HCL 4 MG/ML IJ SOLN: 1.0000 mg | INTRAMUSCULAR | Status: DC | PRN

## 2016-08-04 MED ORDER — VANCOMYCIN HCL IN DEXTROSE 1-5 GM/200ML-% IV SOLN
1000.0000 mg | Freq: Three times a day (TID) | INTRAVENOUS | Status: DC
  Administered 2016-08-05 – 2016-08-07 (×7): 1000 mg via INTRAVENOUS
  Filled 2016-08-04 (×5): qty 200

## 2016-08-04 MED ORDER — ONDANSETRON HCL 4 MG/2ML IJ SOLN
4.0000 mg | Freq: Four times a day (QID) | INTRAMUSCULAR | Status: DC | PRN
  Administered 2016-08-05: 4 mg via INTRAVENOUS
  Filled 2016-08-04: qty 2

## 2016-08-04 MED ORDER — SODIUM CHLORIDE 0.9 % IV SOLN
INTRAVENOUS | Status: DC
  Administered 2016-08-04: 20:00:00 via INTRAVENOUS

## 2016-08-04 MED ORDER — SODIUM CHLORIDE 0.9 % IV BOLUS (SEPSIS)
1000.0000 mL | Freq: Once | INTRAVENOUS | Status: AC
  Administered 2016-08-04: 1000 mL via INTRAVENOUS

## 2016-08-04 MED ORDER — MORPHINE SULFATE (PF) 4 MG/ML IV SOLN
4.0000 mg | Freq: Once | INTRAVENOUS | Status: AC
  Administered 2016-08-04: 4 mg via INTRAVENOUS
  Filled 2016-08-04: qty 1

## 2016-08-04 MED ORDER — HYDROMORPHONE HCL 4 MG/ML IJ SOLN
1.0000 mg | INTRAMUSCULAR | Status: DC | PRN
  Administered 2016-08-04 – 2016-08-05 (×5): 1 mg via INTRAVENOUS
  Filled 2016-08-04 (×5): qty 1

## 2016-08-04 MED ORDER — SODIUM CHLORIDE 0.9 % IV SOLN
INTRAVENOUS | Status: DC
  Administered 2016-08-04: 23:00:00 via INTRAVENOUS

## 2016-08-04 NOTE — Progress Notes (Signed)
Pharmacy Antibiotic Note  Jacob Morasdrian L Obremski Jr. is a 26 y.o. male admitted on 08/04/2016 with right hand cellulitis unimproved on Bactrim x 2 days. Complains of myalgias and subjective fevers.   Pharmacy has been consulted for Vancomycin and Zosyn dosing.  Plan: Vancomycin 2g x 1 then 1g  IV every 8 hours.  Goal trough 10-15 mcg/mL. Zosyn 3.375g IV q8h (4 hour infusion).  Measure Vanc trough at steady state. Follow up renal fxn, culture results, and clinical course.     Temp (24hrs), Avg:98.5 F (36.9 C), Min:98.5 F (36.9 C), Max:98.5 F (36.9 C)   Recent Labs Lab 08/04/16 1835  WBC 14.2*  CREATININE 0.90    Estimated Creatinine Clearance: 159.2 mL/min (by C-G formula based on SCr of 0.9 mg/dL).    Allergies  Allergen Reactions  . Coconut Flavor     Scratchy throat  . No Known Allergies     Antimicrobials this admission: Zosyn 3/18 >>  Vanc 3/18 >>   Dose adjustments this admission:  Microbiology results: BCx: UCx:  Sputum:   MRSA PCR:   Thank you for allowing pharmacy to be a part of this patient's care.  Charolotte Ekeom Darnelle Derrick, PharmD, pager [phone]. 08/04/2016,7:46 PM.

## 2016-08-04 NOTE — H&P (Signed)
History and Physical    Jacob Nixon. ZOX:[id number] DOB: 02/23/1991 DOA: 08/04/2016  PCP: [facility]  Patient coming from: Home.  Chief Complaint: Right upper extremity pain.  HPI: Jacob Nixon. is a 26 y.o. male with history of recurrent cellulitis of the right upper extremity presents to the ER because of worsening pain. Patient had come to the ER 2 days ago and was prescribed Bactrim despite taking which patient's symptoms had not resolved. Patient denies any trauma or fall or insect bite. Patient has been having increasing pain and swelling of the right upper extremity involving the hand and the forearm. Patient is able to make a fist and is able to flex his elbow without difficulty. Has been having fever and chills.   ED Course: In the ER patient was found to be mildly febrile on exam has swelling extending from the hand up to the mid forearm. Since patient failed oral antibiotics patient has been admitted for IV antibiotics.  Review of Systems: As per HPI, rest all negative.   Past Medical History:  Diagnosis Date  . Bronchitis   . Cellulitis of right hand - RECURRENT   . Kidney stones   . Kidney stones     Past Surgical History:  Procedure Laterality Date  . CARPAL TUNNEL RELEASE Left 04/09/2016   Procedure: CARPAL TUNNEL RELEASE;  Surgeon: Cammy Copa, MD;  Location: [facility] OR;  Service: Orthopedics;  Laterality: Left;  . HAND SURGERY    . KIDNEY STONE SURGERY     lithortripsy     reports that he has quit smoking. He has never used smokeless tobacco. He reports that he does not drink alcohol or use drugs.  Allergies  Allergen Reactions  . Coconut Flavor     Scratchy throat  . No Known Allergies     Family History  Problem Relation Age of Onset  . Hypertension Other   . Diabetes Father     Prior to Admission medications   Medication Sig Start Date End Date Taking? Authorizing Provider  acetaminophen (TYLENOL)  500 MG tablet Take 500 mg by mouth every 6 (six) hours as needed for mild pain or headache.   Yes Historical Provider, MD  ibuprofen (ADVIL,MOTRIN) 800 MG tablet Take 1 tablet (800 mg total) by mouth every 8 (eight) hours as needed for mild pain. 08/03/16  Yes Kristen N Ward, DO  methocarbamol (ROBAXIN) 500 MG tablet Take 2 tablets (1,000 mg total) by mouth every 8 (eight) hours as needed for muscle spasms. 06/11/16  Yes Tatyana Kirichenko, PA-C  sulfamethoxazole-trimethoprim (BACTRIM DS,SEPTRA DS) 800-160 MG tablet Take 1 tablet by mouth 2 (two) times daily. 08/03/16 08/10/16 Yes Kristen N Ward, DO  traMADol (ULTRAM) 50 MG tablet Take 1 tablet (50 mg total) by mouth every 6 (six) hours as needed. 08/03/16  Yes Kristen N Ward, DO  metFORMIN (GLUCOPHAGE XR) 500 MG 24 hr tablet Take 1 tablet (500 mg total) by mouth daily with breakfast. Patient not taking: Reported on 08/04/2016 02/01/16   Pete Glatter, MD  naproxen (NAPROSYN) 500 MG tablet Take 1 tablet (500 mg total) by mouth 2 (two) times daily. Patient not taking: Reported on 08/04/2016 04/15/16   Shon Baton, MD  ondansetron (ZOFRAN ODT) 4 MG disintegrating tablet Take 1 tablet (4 mg total) by mouth every 8 (eight) hours as needed for nausea or vomiting. 08/03/16   Kristen N Ward, DO  oxyCODONE-acetaminophen (PERCOCET/ROXICET) 5-325 MG tablet  Take 1-2 tablets by mouth every 6 (six) hours as needed for severe pain. Patient not taking: Reported on 08/04/2016 04/15/16   Shon Baton, MD    Physical Exam: Vitals:   08/04/16 1709 08/04/16 1943 08/04/16 2146 08/04/16 2224  BP: 125/75 118/67 126/79 140/78  Pulse: (!) 105 98 96 100  Resp: 16 18 18 14   Temp: 98.5 F (36.9 C)   99.1 F (37.3 C)  TempSrc: Oral   Oral  SpO2: 98% 96% 95% 95%  Weight:  113.4 kg (250 lb)  113.4 kg (250 lb)  Height:  5\' 11"  (1.803 m)  5\' 11"  (1.803 m)      Constitutional: Moderately built and nourished. Vitals:   08/04/16 1709 08/04/16 1943 08/04/16 2146  08/04/16 2224  BP: 125/75 118/67 126/79 140/78  Pulse: (!) 105 98 96 100  Resp: 16 18 18 14   Temp: 98.5 F (36.9 C)   99.1 F (37.3 C)  TempSrc: Oral   Oral  SpO2: 98% 96% 95% 95%  Weight:  113.4 kg (250 lb)  113.4 kg (250 lb)  Height:  5\' 11"  (1.803 m)  5\' 11"  (1.803 m)   Eyes: Anicteric no pallor. ENMT: No discharge from the ears eyes nose and mouth. Neck: No mass felt. No neck rigidity. Respiratory: No rhonchi or crepitations.  Cardiovascular: S1-S2 heard no murmurs appreciated. Abdomen: Soft nontender bowel sounds present. Musculoskeletal: Right upper extremity is swollen from the hand up to the mid forearm. Patient is able to make a fist. No difficulty in flexing or extending the right elbow. Skin: Mild erythema the right upper extremity. Neurologic: Alert awake oriented to time place and person. Moves all extremities. Psychiatric: Appears normal. Normal affect.   Labs on Admission: I have personally reviewed following labs and imaging studies  CBC:  Recent Labs Lab 08/04/16 1835  WBC 14.2*  NEUTROABS 9.8*  HGB 14.3  HCT 41.4  MCV 83.6  PLT 306   Basic Metabolic Panel:  Recent Labs Lab 08/04/16 1835  NA 137  K 3.9  CL 106  CO2 23  GLUCOSE 119*  BUN 10  CREATININE 0.90  CALCIUM 9.0   GFR: Estimated Creatinine Clearance: 159.2 mL/min (by C-G formula based on SCr of 0.9 mg/dL). Liver Function Tests: No results for input(s): AST, ALT, ALKPHOS, BILITOT, PROT, ALBUMIN in the last 168 hours. No results for input(s): LIPASE, AMYLASE in the last 168 hours. No results for input(s): AMMONIA in the last 168 hours. Coagulation Profile: No results for input(s): INR, PROTIME in the last 168 hours. Cardiac Enzymes: No results for input(s): CKTOTAL, CKMB, CKMBINDEX, TROPONINI in the last 168 hours. BNP (last 3 results) No results for input(s): PROBNP in the last 8760 hours. HbA1C: No results for input(s): HGBA1C in the last 72 hours. CBG: No results for  input(s): GLUCAP in the last 168 hours. Lipid Profile: No results for input(s): CHOL, HDL, LDLCALC, TRIG, CHOLHDL, LDLDIRECT in the last 72 hours. Thyroid Function Tests: No results for input(s): TSH, T4TOTAL, FREET4, T3FREE, THYROIDAB in the last 72 hours. Anemia Panel: No results for input(s): VITAMINB12, FOLATE, FERRITIN, TIBC, IRON, RETICCTPCT in the last 72 hours. Urine analysis:    Component Value Date/Time   COLORURINE YELLOW 01/22/2016 0425   APPEARANCEUR CLOUDY (A) 01/22/2016 0425   LABSPEC 1.025 01/22/2016 0425   PHURINE 6.0 01/22/2016 0425   GLUCOSEU NEGATIVE 01/22/2016 0425   HGBUR NEGATIVE 01/22/2016 0425   BILIRUBINUR NEGATIVE 01/22/2016 0425   KETONESUR NEGATIVE 01/22/2016 0425  PROTEINUR NEGATIVE 01/22/2016 0425   UROBILINOGEN 1.0 11/28/2014 1026   NITRITE NEGATIVE 01/22/2016 0425   LEUKOCYTESUR NEGATIVE 01/22/2016 0425   Sepsis Labs: @LABRCNTIP (procalcitonin:4,lacticidven:4) )No results found for this or any previous visit (from the past 240 hour(s)).   Radiological Exams on Admission: No results found.  Assessment/Plan Principal Problem:   Cellulitis of right upper extremity Active Problems:   Cellulitis of hand    1. Right upper extremity cellulitis - patient has had previous episodes of recurrent cellulitis of the right upper extremity. At this time patient had been placed on vancomycin and Zosyn. Will get MRI of the right upper extremity. Keep right upper extremity elevated. Follow blood cultures and continue hydration and pain medications.   DVT prophylaxis: SCDs. Code Status: Full code.  Family Communication: Family at the bedside.  Disposition Plan: Home.  Consults called: None.  Admission status: Observation.    Eduard ClosKAKRAKANDY,Azula Zappia N. MD [facility] Pager [phone].  If 7PM-7AM, please contact night-coverage [email or web address] Password [email or web address]  08/04/2016, 10:31 PM

## 2016-08-04 NOTE — ED Triage Notes (Signed)
Pt from home with right hand swelling that began on Friday. Pt was seen on 3/16 and began taking bactrim for cellulitis. Pt states the swelling and pain has spread. Pt had adequate pulse in right radius. Pt has decreased sensation in fourth and fifth finger on right hand. Pt denies fever, but states he has had chills.

## 2016-08-04 NOTE — ED Notes (Signed)
Bed: WTR7 Expected date:  Expected time:  Means of arrival:  Comments: 

## 2016-08-04 NOTE — ED Notes (Signed)
Verbal order for Zofran 4mg  IVP given by Dr.Cardama due to pt reporting increase in nausea.

## 2016-08-04 NOTE — ED Provider Notes (Signed)
[facility]-EMERGENCY DEPT Provider Note   CSN: [id number] Arrival date & time: 08/04/16  1659     History   Chief Complaint Chief Complaint  Patient presents with  . hand swelling    HPI Jacob Nixon. is a 26 y.o. male.  The history is provided by the patient.  26 year old male with a history of recurrent right hand cellulitis presents to the ED with inflammation, swelling, redness, pain to the right hand that began 2 days ago and has rapidly worsened. Patient was seen at med Center at [facility] 2 days ago when it first started and placed on Bactrim for cellulitis. Reports that he's been compliant with the medicine however has not been helping. Patient endorses myalgias and subjective fevers. Denies any runny nose, cough, congestion, abdominal pain, nausea, vomiting, diarrhea, chest pain, shortness of breath. Denies any other physical complaints.   Patient was last admitted September 2017 for the same. Require broad-spectrum antibiotics. At that time he was sent home on doxycycline and Keflex. Rheumatologic workup at that time was negative. Patient denies any IV drug use, history of gonorrheal disease, trauma to the area.   Past Medical History:  Diagnosis Date  . Bronchitis   . Cellulitis of right hand - RECURRENT   . Kidney stones   . Kidney stones     Patient Active Problem List   Diagnosis Date Noted  . Carpal tunnel syndrome on left 05/08/2016  . Abscess of upper arm   . Cellulitis 01/22/2016  . Leukocytosis 01/22/2016  . Neuropathy (HCC) 01/22/2016  . Nephrolithiasis 01/22/2016  . Cellulitis of right upper extremity   . Cellulitis of forearm, right 07/30/2015    Past Surgical History:  Procedure Laterality Date  . CARPAL TUNNEL RELEASE Left 04/09/2016   Procedure: CARPAL TUNNEL RELEASE;  Surgeon: Cammy Copa, MD;  Location: [facility] OR;  Service: Orthopedics;  Laterality: Left;  . HAND SURGERY    . KIDNEY STONE SURGERY     lithortripsy       Home  Medications    Prior to Admission medications   Medication Sig Start Date End Date Taking? Authorizing Provider  acetaminophen (TYLENOL) 500 MG tablet Take 500 mg by mouth every 6 (six) hours as needed for mild pain or headache.   Yes Historical Provider, MD  ibuprofen (ADVIL,MOTRIN) 800 MG tablet Take 1 tablet (800 mg total) by mouth every 8 (eight) hours as needed for mild pain. 08/03/16  Yes Kristen N Ward, DO  methocarbamol (ROBAXIN) 500 MG tablet Take 2 tablets (1,000 mg total) by mouth every 8 (eight) hours as needed for muscle spasms. 06/11/16  Yes Tatyana Kirichenko, PA-C  sulfamethoxazole-trimethoprim (BACTRIM DS,SEPTRA DS) 800-160 MG tablet Take 1 tablet by mouth 2 (two) times daily. 08/03/16 08/10/16 Yes Kristen N Ward, DO  traMADol (ULTRAM) 50 MG tablet Take 1 tablet (50 mg total) by mouth every 6 (six) hours as needed. 08/03/16  Yes Kristen N Ward, DO  metFORMIN (GLUCOPHAGE XR) 500 MG 24 hr tablet Take 1 tablet (500 mg total) by mouth daily with breakfast. Patient not taking: Reported on 08/04/2016 02/01/16   Pete Glatter, MD  naproxen (NAPROSYN) 500 MG tablet Take 1 tablet (500 mg total) by mouth 2 (two) times daily. Patient not taking: Reported on 08/04/2016 04/15/16   Shon Baton, MD  ondansetron (ZOFRAN ODT) 4 MG disintegrating tablet Take 1 tablet (4 mg total) by mouth every 8 (eight) hours as needed for nausea or vomiting. 08/03/16   Baxter Hire  N Ward, DO  oxyCODONE-acetaminophen (PERCOCET/ROXICET) 5-325 MG tablet Take 1-2 tablets by mouth every 6 (six) hours as needed for severe pain. Patient not taking: Reported on 08/04/2016 04/15/16   Shon Baton, MD    Family History Family History  Problem Relation Age of Onset  . Diabetes Other   . Hypertension Other     Social History Social History  Substance Use Topics  . Smoking status: Former Games developer  . Smokeless tobacco: Never Used     Comment: States he "vapes"  . Alcohol use No     Comment: occasionally       Allergies   Coconut flavor and No known allergies   Review of Systems Review of Systems Ten systems are reviewed and are negative for acute change except as noted in the HPI  Physical Exam Updated Vital Signs BP 125/75 (BP Location: Left Arm)   Pulse (!) 105   Temp 98.5 F (36.9 C) (Oral)   Resp 16   SpO2 98%   Physical Exam  Constitutional: He is oriented to person, place, and time. He appears well-developed and well-nourished. No distress.  HENT:  Head: Normocephalic and atraumatic.  Right Ear: External ear normal.  Left Ear: External ear normal.  Nose: Nose normal.  Mouth/Throat: Mucous membranes are normal. No trismus in the jaw.  Eyes: Conjunctivae and EOM are normal. No scleral icterus.  Neck: Normal range of motion and phonation normal.  Cardiovascular: Normal rate and regular rhythm.   Pulmonary/Chest: Effort normal. No stridor. No respiratory distress.  Abdominal: He exhibits no distension.  Musculoskeletal: Normal range of motion. He exhibits no edema.  Right hand, wrist, and forearm swelling with mild hyperemia and associated TTP  Neurological: He is alert and oriented to person, place, and time.  Skin: He is not diaphoretic.  Psychiatric: He has a normal mood and affect. His behavior is normal.  Vitals reviewed.    ED Treatments / Results  Labs (all labs ordered are listed, but only abnormal results are displayed) Labs Reviewed  BASIC METABOLIC PANEL - Abnormal; Notable for the following:       Result Value   Glucose, Bld 119 (*)    All other components within normal limits  CBC WITH DIFFERENTIAL/PLATELET - Abnormal; Notable for the following:    WBC 14.2 (*)    Neutro Abs 9.8 (*)    Monocytes Absolute 1.3 (*)    All other components within normal limits    EKG  EKG Interpretation None       Radiology No results found.  Procedures Procedures (including critical care time)  Medications Ordered in ED Medications  sodium chloride  0.9 % bolus 1,000 mL (1,000 mLs Intravenous New Bag/Given 08/04/16 1846)    And  0.9 %  sodium chloride infusion (not administered)  HYDROmorphone (DILAUDID) injection 1 mg (not administered)  clindamycin (CLEOCIN) IVPB 600 mg (0 mg Intravenous Stopped 08/04/16 1933)  morphine 4 MG/ML injection 4 mg (4 mg Intravenous Given 08/04/16 1845)  acetaminophen (TYLENOL) tablet 650 mg (650 mg Oral Given 08/04/16 1846)     Initial Impression / Assessment and Plan / ED Course  I have reviewed the triage vital signs and the nursing notes.  Pertinent labs & imaging results that were available during my care of the patient were reviewed by me and considered in my medical decision making (see chart for details).     Recurrent right hand cellulitis that is failed outpatient management with Bactrim. Rapidly worsening over the  last 24 hours from the wrist up to the mid forearm. Area was marked time indicated. Screening labs reveal leukocytosis. Otherwise reassuring. Initial dose of clindamycin given. Went through records and noted that patient was not responding to clindamycin previously and placed on vancomycin and Zosyn. Vanc/Zosyn added to today's regimen.  Admitted to the hospitalist service for continued management.   Final Clinical Impressions(s) / ED Diagnoses   Final diagnoses:  Cellulitis of right arm      Nira ConnPedro Eduardo Cardama, MD 08/04/16 [phone]

## 2016-08-05 ENCOUNTER — Observation Stay (HOSPITAL_COMMUNITY): Payer: Medicaid Other

## 2016-08-05 DIAGNOSIS — Z91018 Allergy to other foods: Secondary | ICD-10-CM | POA: Diagnosis not present

## 2016-08-05 DIAGNOSIS — L03119 Cellulitis of unspecified part of limb: Secondary | ICD-10-CM | POA: Diagnosis not present

## 2016-08-05 DIAGNOSIS — I890 Lymphedema, not elsewhere classified: Secondary | ICD-10-CM | POA: Diagnosis not present

## 2016-08-05 DIAGNOSIS — Z7984 Long term (current) use of oral hypoglycemic drugs: Secondary | ICD-10-CM | POA: Diagnosis not present

## 2016-08-05 DIAGNOSIS — M79609 Pain in unspecified limb: Secondary | ICD-10-CM

## 2016-08-05 DIAGNOSIS — Z87891 Personal history of nicotine dependence: Secondary | ICD-10-CM | POA: Diagnosis not present

## 2016-08-05 DIAGNOSIS — M7989 Other specified soft tissue disorders: Secondary | ICD-10-CM | POA: Diagnosis not present

## 2016-08-05 DIAGNOSIS — L03113 Cellulitis of right upper limb: Secondary | ICD-10-CM | POA: Diagnosis not present

## 2016-08-05 DIAGNOSIS — Z87442 Personal history of urinary calculi: Secondary | ICD-10-CM | POA: Diagnosis not present

## 2016-08-05 LAB — CBC
HCT: 38 % — ABNORMAL LOW (ref 39.0–52.0)
Hemoglobin: 12.9 g/dL — ABNORMAL LOW (ref 13.0–17.0)
MCH: 28.7 pg (ref 26.0–34.0)
MCHC: 33.9 g/dL (ref 30.0–36.0)
MCV: 84.4 fL (ref 78.0–100.0)
PLATELETS: 283 10*3/uL (ref 150–400)
RBC: 4.5 MIL/uL (ref 4.22–5.81)
RDW: 13.6 % (ref 11.5–15.5)
WBC: 11.1 10*3/uL — ABNORMAL HIGH (ref 4.0–10.5)

## 2016-08-05 LAB — BASIC METABOLIC PANEL
Anion gap: 6 (ref 5–15)
BUN: 9 mg/dL (ref 6–20)
CALCIUM: 8.4 mg/dL — AB (ref 8.9–10.3)
CHLORIDE: 107 mmol/L (ref 101–111)
CO2: 23 mmol/L (ref 22–32)
CREATININE: 1.02 mg/dL (ref 0.61–1.24)
GFR calc Af Amer: >60 mL/min (ref >60)
GFR calc non Af Amer: >60 mL/min (ref >60)
GLUCOSE: 125 mg/dL — AB (ref 65–99)
Potassium: 3.7 mmol/L (ref 3.5–5.1)
Sodium: 136 mmol/L (ref 135–145)

## 2016-08-05 MED ORDER — PANTOPRAZOLE SODIUM 40 MG PO TBEC
40.0000 mg | DELAYED_RELEASE_TABLET | Freq: Every day | ORAL | Status: DC
  Administered 2016-08-05 – 2016-08-07 (×3): 40 mg via ORAL
  Filled 2016-08-05 (×3): qty 1

## 2016-08-05 MED ORDER — IBUPROFEN 200 MG PO TABS: 400.0000 mg | ORAL_TABLET | Freq: Four times a day (QID) | ORAL | Status: DC | PRN

## 2016-08-05 MED ORDER — LACTATED RINGERS IV SOLN
INTRAVENOUS | Status: DC
  Administered 2016-08-05: 09:00:00 via INTRAVENOUS
  Administered 2016-08-06: 1000 mL via INTRAVENOUS

## 2016-08-05 MED ORDER — PROMETHAZINE HCL 25 MG/ML IJ SOLN
12.5000 mg | Freq: Four times a day (QID) | INTRAMUSCULAR | Status: DC | PRN
  Administered 2016-08-05: 12.5 mg via INTRAVENOUS
  Filled 2016-08-05: qty 1

## 2016-08-05 MED ORDER — OXYCODONE HCL 5 MG PO TABS
5.0000 mg | ORAL_TABLET | Freq: Once | ORAL | Status: AC
  Administered 2016-08-05: 5 mg via ORAL
  Filled 2016-08-05: qty 1

## 2016-08-05 MED ORDER — HYDROMORPHONE HCL 4 MG/ML IJ SOLN
1.0000 mg | INTRAMUSCULAR | Status: DC | PRN
  Administered 2016-08-06 – 2016-08-07 (×8): 1 mg via INTRAVENOUS
  Filled 2016-08-05 (×8): qty 1

## 2016-08-05 NOTE — Progress Notes (Signed)
PROGRESS NOTE    Jacob Nixon.  WUJ:[id number] DOB: Apr 25, 1991 DOA: 08/04/2016 PCP: [facility]    Brief Narrative:  26 yo male with chronic right upper extremity edema, presents with fever, chills and right upper extremity erythematous rash. Patient failed outpatient therapy with bactrim. On the initial examination hemodynamically stable, positive rash and edema on right upper extremity. Positive leukocytosis. Started on broad spectrum IV antibiotics. Follow on right upper extremity US.    Assessment & Plan:   Principal Problem:   Cellulitis of right upper extremity Active Problems:   Cellulitis of hand   1. Right upper extremity cellulitis. Will continue antibiotic therapy with vanc and zosyn, clinically improved erythema, but still significant edema and pain, decrease range of motion at the wrist. Will continue IV antibiotics for now due to critical location of cellulitis and risk of septic joint.    2. Chronic right upper extremity edema, possible lymphedema. Check [place] to rule out DVT.     DVT prophylaxis: enoxaparin Code Status: full  Family Communication: No family at the bedside  Disposition Plan: home    Consultants:     Procedures:     Antimicrobials:  Vancomycin  Zosyn    Subjective: Positive pain on the right upper extremity, 8/10 in intensity, dull in nature, no radiation, worse with movement, associated with local edema and chills. No nausea or vomiting.   Objective: Vitals:   08/04/16 1943 08/04/16 2146 08/04/16 2224 08/05/16 0621  BP: 118/67 126/79 140/78 122/77  Pulse: 98 96 100 79  Resp: 18 18 14 16   Temp:   99.1 F (37.3 C) 98.4 F (36.9 C)  TempSrc:   Oral Oral  SpO2: 96% 95% 95% 94%  Weight: 113.4 kg (250 lb)  113.4 kg (250 lb)   Height: 5\' 11"  (1.803 m)  5\' 11"  (1.803 m)     Intake/Output Summary (Last 24 hours) at 08/05/16 0805 Last data filed at 08/05/16 0600  Gross per 24 hour  Intake           3278.33 ml  Output                0 ml  Net          3278.33 ml   Filed Weights   08/04/16 1943 08/04/16 2224  Weight: 113.4 kg (250 lb) 113.4 kg (250 lb)    Examination:  General exam: deconditioned E ENT: no pallor or icterus. Oral mucosa dry.  Respiratory system: Clear to auscultation. Respiratory effort normal. No wheezing, rales or rhonchi.  Cardiovascular system: S1 & S2 heard, RRR. No JVD, murmurs, rubs, gallops or clicks. No pedal edema. Gastrointestinal system: Abdomen is nondistended, soft and nontender. No organomegaly or masses felt. Normal bowel sounds heard. Central nervous system: Alert and oriented. No focal neurological deficits. Extremities: Symmetric 5 x 5 power.Right upper extremity with edema, ++, non pitting. No frank erythema, but positive tenderness to palpation.   Skin: No rashes, lesions or ulcers     Data Reviewed: I have personally reviewed following labs and imaging studies  CBC:  Recent Labs Lab 08/04/16 1835 08/05/16 0140  WBC 14.2* 11.1*  NEUTROABS 9.8*  --   HGB 14.3 12.9*  HCT 41.4 38.0*  MCV 83.6 84.4  PLT 306 283   Basic Metabolic Panel:  Recent Labs Lab 08/04/16 1835 08/05/16 0140  NA 137 136  K 3.9 3.7  CL 106 107  CO2 23 23  GLUCOSE 119* 125*  BUN 10 9  CREATININE 0.90 1.02  CALCIUM 9.0 8.4*   GFR: Estimated Creatinine Clearance: 140.5 mL/min (by C-G formula based on SCr of 1.02 mg/dL). Liver Function Tests: No results for input(s): AST, ALT, ALKPHOS, BILITOT, PROT, ALBUMIN in the last 168 hours. No results for input(s): LIPASE, AMYLASE in the last 168 hours. No results for input(s): AMMONIA in the last 168 hours. Coagulation Profile: No results for input(s): INR, PROTIME in the last 168 hours. Cardiac Enzymes: No results for input(s): CKTOTAL, CKMB, CKMBINDEX, TROPONINI in the last 168 hours. BNP (last 3 results) No results for input(s): PROBNP in the last 8760 hours. HbA1C: No results for input(s): HGBA1C in  the last 72 hours. CBG: No results for input(s): GLUCAP in the last 168 hours. Lipid Profile: No results for input(s): CHOL, HDL, LDLCALC, TRIG, CHOLHDL, LDLDIRECT in the last 72 hours. Thyroid Function Tests: No results for input(s): TSH, T4TOTAL, FREET4, T3FREE, THYROIDAB in the last 72 hours. Anemia Panel: No results for input(s): VITAMINB12, FOLATE, FERRITIN, TIBC, IRON, RETICCTPCT in the last 72 hours. Sepsis Labs: No results for input(s): PROCALCITON, LATICACIDVEN in the last 168 hours.  No results found for this or any previous visit (from the past 240 hour(s)).       Radiology Studies: No results found.      Scheduled Meds: . pantoprazole  40 mg Oral Daily  . piperacillin-tazobactam (ZOSYN)  IV  3.375 g Intravenous Q8H  . vancomycin  1,000 mg Intravenous Q8H   Continuous Infusions: . lactated ringers       LOS: 0 days     Mauricio Annett Gulaaniel Arrien, MD [facility] Pager [phone]  If 7PM-7AM, please contact night-coverage [email or web address] Password [email or web address] 08/05/2016, 8:05 AM

## 2016-08-05 NOTE — Progress Notes (Signed)
VASCULAR LAB PRELIMINARY  PRELIMINARY  PRELIMINARY  PRELIMINARY  Right upper extremity venous duplex completed.    Preliminary report:  Right :  No evidence of DVT or superficial thrombosis.    Jacob Nixon, RVS 08/05/2016, 3:05 PM

## 2016-08-06 LAB — BASIC METABOLIC PANEL
ANION GAP: 6 (ref 5–15)
BUN: 9 mg/dL (ref 6–20)
CALCIUM: 8.6 mg/dL — AB (ref 8.9–10.3)
CO2: 26 mmol/L (ref 22–32)
CREATININE: 1.07 mg/dL (ref 0.61–1.24)
Chloride: 107 mmol/L (ref 101–111)
Glucose, Bld: 121 mg/dL — ABNORMAL HIGH (ref 65–99)
Potassium: 3.8 mmol/L (ref 3.5–5.1)
SODIUM: 139 mmol/L (ref 135–145)

## 2016-08-06 LAB — CBC WITH DIFFERENTIAL/PLATELET
Basophils Absolute: 0 10*3/uL (ref 0.0–0.1)
Basophils Relative: 0 %
Eosinophils Absolute: 0.3 10*3/uL (ref 0.0–0.7)
Eosinophils Relative: 3 %
HCT: 37.5 % — ABNORMAL LOW (ref 39.0–52.0)
Hemoglobin: 12.8 g/dL — ABNORMAL LOW (ref 13.0–17.0)
Lymphocytes Relative: 29 %
Lymphs Abs: 2.8 10*3/uL (ref 0.7–4.0)
MCH: 28.5 pg (ref 26.0–34.0)
MCHC: 34.1 g/dL (ref 30.0–36.0)
MCV: 83.5 fL (ref 78.0–100.0)
Monocytes Absolute: 1.2 10*3/uL — ABNORMAL HIGH (ref 0.1–1.0)
Monocytes Relative: 13 %
Neutro Abs: 5.3 10*3/uL (ref 1.7–7.7)
Neutrophils Relative %: 55 %
Platelets: 287 10*3/uL (ref 150–400)
RBC: 4.49 MIL/uL (ref 4.22–5.81)
RDW: 13.4 % (ref 11.5–15.5)
WBC: 9.7 10*3/uL (ref 4.0–10.5)

## 2016-08-06 MED ORDER — IBUPROFEN 200 MG PO TABS
400.0000 mg | ORAL_TABLET | Freq: Four times a day (QID) | ORAL | Status: DC
  Administered 2016-08-06 – 2016-08-07 (×5): 400 mg via ORAL
  Filled 2016-08-06 (×5): qty 2

## 2016-08-06 NOTE — Progress Notes (Signed)
PROGRESS NOTE    Jacob Nixon.  ZOX:[id number] DOB: 02/10/1991 DOA: 08/04/2016 PCP: [facility]    Brief Narrative:  26 yo male with chronic right upper extremity edema, presents with fever, chills and right upper extremity erythematous rash. Patient failed outpatient therapy with bactrim. On the initial examination hemodynamically stable, positive rash and edema on right upper extremity. Positive leukocytosis. Started on broad spectrum IV antibiotics. [place] negative for DVT.    Assessment & Plan:   Principal Problem:   Cellulitis of right upper extremity Active Problems:   Cellulitis of hand   1. Right upper extremity cellulitis. On broad spectrum antibiotic therapy with vanc and zosyn, improved rythema, but persistent  edema and pain with decrease range of motion at the wrist. Suspected lymphatic dysfunction as the cause of recurrent cellulitis, may need referral to tertiary care at discharge. Will follow cell count and temperature curve. Will change ibuprofen to schedule to improve anti-inflammatory therapy.   2. Chronic right upper extremity edema, possible lymphedema. Negative for DVT per [place], will need referral to tertiary care for further workup on lymphatic dysfunction.      DVT prophylaxis: enoxaparin Code Status: full  Family Communication: No family at the bedside  Disposition Plan: home    Consultants:     Procedures:     Antimicrobials:  Vancomycin  Zosyn    Subjective: Erythema improved, but still positive edema and tenderness on his right upper extremity. Decrease range of motion due to pain, no nausea or vomiting.   Objective: Vitals:   08/05/16 1059 08/05/16 1546 08/05/16 2154 08/06/16 0602  BP: 105/60 115/60 130/76 98/64  Pulse: 72 78 94 85  Resp: 18 18 18 16   Temp: 98.5 F (36.9 C) 97.9 F (36.6 C) 98.3 F (36.8 C) 98.3 F (36.8 C)  TempSrc: Oral Oral Oral Oral  SpO2: 95% 98% 99% 99%  Weight:   113.4 kg (250 lb)    Height:  5\' 11"  (1.803 m)      Intake/Output Summary (Last 24 hours) at 08/06/16 1334 Last data filed at 08/06/16 0700  Gross per 24 hour  Intake              840 ml  Output                0 ml  Net              840 ml   Filed Weights   08/04/16 1943 08/04/16 2224 08/05/16 1546  Weight: 113.4 kg (250 lb) 113.4 kg (250 lb) 113.4 kg (250 lb)    Examination:  General exam: not in pain or dyspnea E ENT: no pallor or icterus.  Respiratory system: Clear to auscultation. Respiratory effort normal. Cardiovascular system: S1 & S2 heard, RRR. No JVD, murmurs, rubs, gallops or clicks. No pedal edema. Gastrointestinal system: Abdomen is nondistended, soft and nontender. No organomegaly or masses felt. Normal bowel sounds heard. Central nervous system: Alert and oriented. No focal neurological deficits. Extremities: Symmetric 5 x 5 power. Right upper extremity with edema, non pitting, no frank erythema.  Skin: No rashes, lesions or ulcers    Data Reviewed: I have personally reviewed following labs and imaging studies  CBC:  Recent Labs Lab 08/04/16 1835 08/05/16 0140 08/06/16 0502  WBC 14.2* 11.1* 9.7  NEUTROABS 9.8*  --  5.3  HGB 14.3 12.9* 12.8*  HCT 41.4 38.0* 37.5*  MCV 83.6 84.4 83.5  PLT 306 283 287  Basic Metabolic Panel:  Recent Labs Lab 08/04/16 1835 08/05/16 0140 08/06/16 0502  NA 137 136 139  K 3.9 3.7 3.8  CL 106 107 107  CO2 23 23 26   GLUCOSE 119* 125* 121*  BUN 10 9 9   CREATININE 0.90 1.02 1.07  CALCIUM 9.0 8.4* 8.6*   GFR: Estimated Creatinine Clearance: 133.9 mL/min (by C-G formula based on SCr of 1.07 mg/dL). Liver Function Tests: No results for input(s): AST, ALT, ALKPHOS, BILITOT, PROT, ALBUMIN in the last 168 hours. No results for input(s): LIPASE, AMYLASE in the last 168 hours. No results for input(s): AMMONIA in the last 168 hours. Coagulation Profile: No results for input(s): INR, PROTIME in the last 168  hours. Cardiac Enzymes: No results for input(s): CKTOTAL, CKMB, CKMBINDEX, TROPONINI in the last 168 hours. BNP (last 3 results) No results for input(s): PROBNP in the last 8760 hours. HbA1C: No results for input(s): HGBA1C in the last 72 hours. CBG: No results for input(s): GLUCAP in the last 168 hours. Lipid Profile: No results for input(s): CHOL, HDL, LDLCALC, TRIG, CHOLHDL, LDLDIRECT in the last 72 hours. Thyroid Function Tests: No results for input(s): TSH, T4TOTAL, FREET4, T3FREE, THYROIDAB in the last 72 hours. Anemia Panel: No results for input(s): VITAMINB12, FOLATE, FERRITIN, TIBC, IRON, RETICCTPCT in the last 72 hours. Sepsis Labs: No results for input(s): PROCALCITON, LATICACIDVEN in the last 168 hours.  No results found for this or any previous visit (from the past 240 hour(s)).       Radiology Studies: No results found.      Scheduled Meds: . pantoprazole  40 mg Oral Daily  . piperacillin-tazobactam (ZOSYN)  IV  3.375 g Intravenous Q8H  . vancomycin  1,000 mg Intravenous Q8H   Continuous Infusions: . lactated ringers 1,000 mL (08/06/16 0648)     LOS: 1 day       Radin Raptis Annett Gulaaniel Jaikob Borgwardt, MD [facility] Pager [phone]  If 7PM-7AM, please contact night-coverage [email or web address] Password [email or web address] 08/06/2016, 1:34 PM

## 2016-08-06 NOTE — Progress Notes (Signed)
Pharmacy Antibiotic Note  Jacob Morasdrian Nixon Trickett Jr. is a 26 y.o. male admitted on 08/04/2016 with right hand cellulitis unimproved on Bactrim x 2 days. Complains of myalgias and subjective fevers.   Pharmacy has been consulted for Vancomycin and Zosyn dosing.  Plan: Vancomycin 2g x 1 then 1g  IV every 8 hours.  Goal trough 10-15 mcg/mL. Zosyn 3.375g IV q8h (4 hour infusion).  Measure Vanc trough at steady state - 0500 on 3/21 Follow up renal fxn, culture results, and clinical course.  Height: 5\' 11"  (180.3 cm) Weight: 250 lb (113.4 kg) IBW/kg (Calculated) : 75.3  Temp (24hrs), Avg:98.2 F (36.8 C), Min:97.9 F (36.6 C), Max:98.3 F (36.8 C)   Recent Labs Lab 08/04/16 1835 08/05/16 0140 08/06/16 0502  WBC 14.2* 11.1* 9.7  CREATININE 0.90 1.02 1.07    Estimated Creatinine Clearance: 133.9 mL/min (by C-G formula based on SCr of 1.07 mg/dL).    Allergies  Allergen Reactions  . Coconut Flavor     Scratchy throat  . No Known Allergies    Antimicrobials this admission: Zosyn 3/18 >>  Vanc 3/18 >>   Dose adjustments this admission: 3/21 Vanc trough at 0500 prior to 6a dose = ____  Microbiology results: 3/19 BCx: sent  Thank you for allowing pharmacy to be a part of this patient's care.  Jacob Nixon, Jacob Nixon PharmD Pager [phone] 08/06/2016, 2:37 PM

## 2016-08-07 LAB — BASIC METABOLIC PANEL
ANION GAP: 5 (ref 5–15)
BUN: 10 mg/dL (ref 6–20)
CALCIUM: 8.8 mg/dL — AB (ref 8.9–10.3)
CO2: 27 mmol/L (ref 22–32)
CREATININE: 1.03 mg/dL (ref 0.61–1.24)
Chloride: 108 mmol/L (ref 101–111)
GFR calc Af Amer: >60 mL/min (ref >60)
GLUCOSE: 105 mg/dL — AB (ref 65–99)
Potassium: 4.1 mmol/L (ref 3.5–5.1)
Sodium: 140 mmol/L (ref 135–145)

## 2016-08-07 LAB — CBC WITH DIFFERENTIAL/PLATELET
BASOS ABS: 0 10*3/uL (ref 0.0–0.1)
BASOS PCT: 0 %
EOS PCT: 3 %
Eosinophils Absolute: 0.4 10*3/uL (ref 0.0–0.7)
HCT: 39.3 % (ref 39.0–52.0)
Hemoglobin: 13.2 g/dL (ref 13.0–17.0)
Lymphocytes Relative: 21 %
Lymphs Abs: 2.6 10*3/uL (ref 0.7–4.0)
MCH: 28.6 pg (ref 26.0–34.0)
MCHC: 33.6 g/dL (ref 30.0–36.0)
MCV: 85.2 fL (ref 78.0–100.0)
MONOS PCT: 13 %
Monocytes Absolute: 1.5 10*3/uL — ABNORMAL HIGH (ref 0.1–1.0)
Neutro Abs: 7.6 10*3/uL (ref 1.7–7.7)
Neutrophils Relative %: 63 %
PLATELETS: 314 10*3/uL (ref 150–400)
RBC: 4.61 MIL/uL (ref 4.22–5.81)
RDW: 13.7 % (ref 11.5–15.5)
WBC: 12.2 10*3/uL — ABNORMAL HIGH (ref 4.0–10.5)

## 2016-08-07 LAB — VANCOMYCIN, TROUGH: Vancomycin Tr: 14 ug/mL — ABNORMAL LOW (ref 15–20)

## 2016-08-07 MED ORDER — CLINDAMYCIN HCL 300 MG PO CAPS: 300.0000 mg | ORAL_CAPSULE | Freq: Three times a day (TID) | ORAL | 0 refills | Status: DC

## 2016-08-07 MED ORDER — TRAMADOL HCL 50 MG PO TABS
50.0000 mg | ORAL_TABLET | Freq: Four times a day (QID) | ORAL | Status: DC | PRN
  Administered 2016-08-07: 50 mg via ORAL
  Filled 2016-08-07: qty 1

## 2016-08-07 MED ORDER — CLINDAMYCIN HCL 300 MG PO CAPS
300.0000 mg | ORAL_CAPSULE | Freq: Three times a day (TID) | ORAL | Status: DC
  Filled 2016-08-07: qty 1

## 2016-08-07 MED ORDER — IBUPROFEN 600 MG PO TABS: 600.0000 mg | ORAL_TABLET | Freq: Three times a day (TID) | ORAL | 0 refills | Status: DC

## 2016-08-07 NOTE — Progress Notes (Signed)
Letter for work given to patient.

## 2016-08-07 NOTE — Progress Notes (Signed)
Patient d/c home. Stable. 

## 2016-08-07 NOTE — Progress Notes (Signed)
Discharge instructions given to patient, verbalized understanding.Prescriptions given. Texted Md to get patient's letter for work. Medicated with Tramadol 50 mg po. Will continue to monitor.

## 2016-08-07 NOTE — Progress Notes (Signed)
[facility]   26 y/o M with no significant PMHx, presented to the ED with increase in swelling and pain of the right hand. Patient was diagnosed with R hand cellulitis 2 days PTA and was given Bactrim but provided no improvement. Patient was started in IV zosyn and Vancomycin.   Patient report significant improvement of the swelling, but still some pain 5/10. Erythema has resolved.   Patient will be discharge home with clindamycin 300 mg q 8 hrs for 7 days and follow up with PCP.   Full discharge summary to follow.    Latrelle DodrillEdwin Silva, MD

## 2016-08-07 NOTE — Progress Notes (Signed)
Pharmacy Antibiotic Note  Jacob Morasdrian L Domingos Jr. is a 26 y.o. male admitted on 08/04/2016 with right hand cellulitis unimproved on Bactrim x 2 days. Complains of myalgias and subjective fevers.   Pharmacy has been consulted for Vancomycin and Zosyn dosing.  Plan: Vancomycin 2g x 1 then 1g  IV every 8 hours.  Goal trough 10-15 mcg/mL. Zosyn 3.375g IV q8h (4 hour infusion).  0425 VT=14, Scr stable, continue vancomycin 1 Gm IV q8h Follow up renal fxn, culture results, and clinical course.  Height: 5\' 11"  (180.3 cm) Weight: 250 lb (113.4 kg) IBW/kg (Calculated) : 75.3  Temp (24hrs), Avg:98.4 F (36.9 C), Min:98.2 F (36.8 C), Max:98.6 F (37 C)   Recent Labs Lab 08/04/16 1835 08/05/16 0140 08/06/16 0502 08/07/16 0425  WBC 14.2* 11.1* 9.7 12.2*  CREATININE 0.90 1.02 1.07 1.03  VANCOTROUGH  --   --   --  14*    Estimated Creatinine Clearance: 139.1 mL/min (by C-G formula based on SCr of 1.03 mg/dL).    Allergies  Allergen Reactions  . Coconut Flavor     Scratchy throat  . No Known Allergies    Antimicrobials this admission: Zosyn 3/18 >>  Vanc 3/18 >>   Dose adjustments this admission: 3/21 Vanc trough at 0500 prior to 6a dose= 14 mg/L  Microbiology results: 3/19 BCx: sent  Thank you for allowing pharmacy to be a part of this patient's care.   Lorenza EvangelistGreen, Lailee Hoelzel R 08/07/2016, 5:20 AM

## 2016-08-08 NOTE — Discharge Summary (Signed)
Physician Discharge Summary  [name].  ZOX:[id number]  DOB: 08-Nov-1990  DOA: 08/04/2016 [facility]CP: [facility]  Admit date: 08/04/2016 Discharge date: 08/08/2016  Admitted From: Home  Disposition:  Home  Recommendations for Outpatient Follow-up:  1. Follow up with PCP in 1-2 weeks  Discharge Condition: Stable CODE STATUS: FULL  Diet recommendation: Heart Healthy   Brief/Interim Summary: 26 y/o M with no significant PMHx, presented to the ED with increase in swelling and pain of the right hand. Patient was diagnosed with R hand cellulitis 2 days PTA and was given Bactrim but provided no improvement. Patient was admitted for IV abx, started in IV zosyn and Vancomycin. Patient slowly improved and was discharged home on oral clindamycin to complete a course of 10 days.  Discharge Diagnoses/Hospital Course:  Right hand cellulitis - Patient was treated with Vanc and Zosyn for 3 days, erythema improved, but erythema persisted, Doppler US was done which was negative for DVT. Patient was started on ibuprofen which provided swelling and pain improvement. Patient was transitioned to oral clindamycin 300 mg TID, of the day of discharge. Blood cultures were not drawn. There is concern of chronic lymphedema as patient have has recurrent cellulitis, but will defer workup to outpatient setting. Follow up with PCP in 1 week.   On the day of the discharge the patient's vitals were stable, and no other acute medical conditions were reported by patient. Patient was felt safe to be discharged to home   Discharge Instructions  You were cared for by a hospitalist during your hospital stay. If you have any questions about your discharge medications or the care you received while you were in the hospital after you are discharged, you can call the unit and asked to speak with the hospitalist on call if the hospitalist that took care of you is not available. Once you are discharged,  your primary care physician will handle any further medical issues. Please note that NO REFILLS for any discharge medications will be authorized once you are discharged, as it is imperative that you return to your primary care physician (or establish a relationship with a primary care physician if you do not have one) for your aftercare needs so that they can reassess your need for medications and monitor your lab values.  Discharge Instructions    Call MD for:  difficulty breathing, headache or visual disturbances    Complete by:  As directed    Call MD for:  extreme fatigue    Complete by:  As directed    Call MD for:  hives    Complete by:  As directed    Call MD for:  persistant dizziness or light-headedness    Complete by:  As directed    Call MD for:  persistant nausea and vomiting    Complete by:  As directed    Call MD for:  redness, tenderness, or signs of infection (pain, swelling, redness, odor or green/yellow discharge around incision site)    Complete by:  As directed    Call MD for:  severe uncontrolled pain    Complete by:  As directed    Call MD for:  temperature >100.4    Complete by:  As directed    Diet - low sodium heart healthy    Complete by:  As directed    Increase activity slowly    Complete by:  As directed      Allergies as of 08/07/2016  Reactions   Coconut Flavor    Scratchy throat   No Known Allergies       Medication List    STOP taking these medications   metFORMIN 500 MG 24 hr tablet Commonly known as:  GLUCOPHAGE XR   methocarbamol 500 MG tablet Commonly known as:  ROBAXIN   naproxen 500 MG tablet Commonly known as:  NAPROSYN   oxyCODONE-acetaminophen 5-325 MG tablet Commonly known as:  PERCOCET/ROXICET   sulfamethoxazole-trimethoprim 800-160 MG tablet Commonly known as:  BACTRIM DS,SEPTRA DS     TAKE these medications   acetaminophen 500 MG tablet Commonly known as:  TYLENOL Take 500 mg by mouth every 6 (six) hours as needed  for mild pain or headache.   clindamycin 300 MG capsule Commonly known as:  CLEOCIN Take 1 capsule (300 mg total) by mouth every 8 (eight) hours.   ibuprofen 600 MG tablet Commonly known as:  ADVIL,MOTRIN Take 1 tablet (600 mg total) by mouth 3 (three) times daily. What changed:  medication strength  how much to take  when to take this  reasons to take this   ondansetron 4 MG disintegrating tablet Commonly known as:  ZOFRAN ODT Take 1 tablet (4 mg total) by mouth every 8 (eight) hours as needed for nausea or vomiting.   traMADol 50 MG tablet Commonly known as:  ULTRAM Take 1 tablet (50 mg total) by mouth every 6 (six) hours as needed.      Follow-up Information    [facility]. Schedule an appointment as soon as possible for a visit in 1 week(s).   Specialty:  Family Medicine         Allergies  Allergen Reactions  . Coconut Flavor     Scratchy throat  . No Known Allergies     Consultations:  None   Procedures/Studies:  No results found.   Discharge Exam: Vitals:   08/06/16 2126 08/07/16 0456  BP: 120/70 121/71  Pulse: 78 83  Resp: 16 18  Temp: 98.5 F (36.9 C) 98.6 F (37 C)   Vitals:   08/06/16 0602 08/06/16 1515 08/06/16 2126 08/07/16 0456  BP: 98/64 126/77 120/70 121/71  Pulse: 85 78 78 83  Resp: 16 18 16 18   Temp: 98.3 F (36.8 C) 98.2 F (36.8 C) 98.5 F (36.9 C) 98.6 F (37 C)  TempSrc: Oral Oral Oral Oral  SpO2: 99% 100% 99% 98%  Weight:      Height:        General: Pt is alert, awake, not in acute distress Cardiovascular: RRR, S1/S2 +, no rubs, no gallops Respiratory: CTA bilaterally, no wheezing, no rhonchi Abdominal: Soft, NT, ND, bowel sounds + Extremities: mild r hand edema, no erythema. Full range of motion     The results of significant diagnostics from this hospitalization (including imaging, microbiology, ancillary and laboratory) are listed below for reference.      Microbiology: Recent Results (from the past 240 hour(s))  Culture, blood (routine x 2)     Status: None (Preliminary result)   Collection Time: 08/05/16  1:48 AM  Result Value Ref Range Status   Specimen Description BLOOD RIGHT ANTECUBITAL  Final   Special Requests BOTTLES DRAWN AEROBIC AND ANAEROBIC  Final   Culture   Final    NO GROWTH 3 DAYS Performed at [facility], [address] [place], [place] [address]    Report Status PENDING  Incomplete  Culture, blood (routine x 2)  Status: None (Preliminary result)   Collection Time: 08/05/16  1:48 AM  Result Value Ref Range Status   Specimen Description BLOOD LEFT ANTECUBITAL  Final   Special Requests BOTTLES DRAWN AEROBIC AND ANAEROBIC 10ML  Final   Culture   Final    NO GROWTH 3 DAYS Performed at [facility], [address] [place], [place] [address]    Report Status PENDING  Incomplete     Labs: BNP (last 3 results) No results for input(s): BNP in the last 8760 hours. Basic Metabolic Panel:  Recent Labs Lab 08/04/16 1835 08/05/16 0140 08/06/16 0502 08/07/16 0425  NA 137 136 139 140  K 3.9 3.7 3.8 4.1  CL 106 107 107 108  CO2 23 23 26 27   GLUCOSE 119* 125* 121* 105*  BUN 10 9 9 10   CREATININE 0.90 1.02 1.07 1.03  CALCIUM 9.0 8.4* 8.6* 8.8*   Liver Function Tests: No results for input(s): AST, ALT, ALKPHOS, BILITOT, PROT, ALBUMIN in the last 168 hours. No results for input(s): LIPASE, AMYLASE in the last 168 hours. No results for input(s): AMMONIA in the last 168 hours. CBC:  Recent Labs Lab 08/04/16 1835 08/05/16 0140 08/06/16 0502 08/07/16 0425  WBC 14.2* 11.1* 9.7 12.2*  NEUTROABS 9.8*  --  5.3 7.6  HGB 14.3 12.9* 12.8* 13.2  HCT 41.4 38.0* 37.5* 39.3  MCV 83.6 84.4 83.5 85.2  PLT 306 283 287 314   Cardiac Enzymes: No results for input(s): CKTOTAL, CKMB, CKMBINDEX, TROPONINI in the last 168 hours. BNP: Invalid input(s): POCBNP CBG: No results for input(s): GLUCAP in  the last 168 hours. D-Dimer No results for input(s): DDIMER in the last 72 hours. Hgb A1c No results for input(s): HGBA1C in the last 72 hours. Lipid Profile No results for input(s): CHOL, HDL, LDLCALC, TRIG, CHOLHDL, LDLDIRECT in the last 72 hours. Thyroid function studies No results for input(s): TSH, T4TOTAL, T3FREE, THYROIDAB in the last 72 hours.  Invalid input(s): FREET3 Anemia work up No results for input(s): VITAMINB12, FOLATE, FERRITIN, TIBC, IRON, RETICCTPCT in the last 72 hours. Urinalysis    Component Value Date/Time   COLORURINE YELLOW 01/22/2016 0425   APPEARANCEUR CLOUDY (A) 01/22/2016 0425   LABSPEC 1.025 01/22/2016 0425   PHURINE 6.0 01/22/2016 0425   GLUCOSEU NEGATIVE 01/22/2016 0425   HGBUR NEGATIVE 01/22/2016 0425   BILIRUBINUR NEGATIVE 01/22/2016 0425   KETONESUR NEGATIVE 01/22/2016 0425   PROTEINUR NEGATIVE 01/22/2016 0425   UROBILINOGEN 1.0 11/28/2014 1026   NITRITE NEGATIVE 01/22/2016 0425   LEUKOCYTESUR NEGATIVE 01/22/2016 0425   Sepsis Labs Invalid input(s): PROCALCITONIN,  WBC,  LACTICIDVEN Microbiology Recent Results (from the past 240 hour(s))  Culture, blood (routine x 2)     Status: None (Preliminary result)   Collection Time: 08/05/16  1:48 AM  Result Value Ref Range Status   Specimen Description BLOOD RIGHT ANTECUBITAL  Final   Special Requests BOTTLES DRAWN AEROBIC AND ANAEROBIC 10ML  Final   Culture   Final    NO GROWTH 3 DAYS Performed at [facility], [address] [place], [place] [address]    Report Status PENDING  Incomplete  Culture, blood (routine x 2)     Status: None (Preliminary result)   Collection Time: 08/05/16  1:48 AM  Result Value Ref Range Status   Specimen Description BLOOD LEFT ANTECUBITAL  Final   Special Requests BOTTLES DRAWN AEROBIC AND ANAEROBIC 10ML  Final   Culture   Final    NO GROWTH  3 DAYS Performed at [facility], [address], [place], [place] [address]    Report Status PENDING   Incomplete    Time coordinating discharge: 35 minutes   SIGNED:  Latrelle Dodrill, MD  [facility] 08/08/2016, 9:16 PM  Pager please text page via  [email or web address] Password [email or web address]

## 2016-08-09 ENCOUNTER — Encounter (HOSPITAL_COMMUNITY): Payer: Self-pay | Admitting: Emergency Medicine

## 2016-08-09 ENCOUNTER — Emergency Department (HOSPITAL_COMMUNITY)
Admission: EM | Admit: 2016-08-09 | Discharge: 2016-08-09 | Disposition: A | Discharge status: Home / Self Care | Payer: Medicaid Other | Attending: Emergency Medicine | Admitting: Emergency Medicine

## 2016-08-09 DIAGNOSIS — Z87891 Personal history of nicotine dependence: Secondary | ICD-10-CM | POA: Insufficient documentation

## 2016-08-09 DIAGNOSIS — R197 Diarrhea, unspecified: Secondary | ICD-10-CM | POA: Diagnosis not present

## 2016-08-09 DIAGNOSIS — R112 Nausea with vomiting, unspecified: Secondary | ICD-10-CM

## 2016-08-09 DIAGNOSIS — Z79899 Other long term (current) drug therapy: Secondary | ICD-10-CM | POA: Diagnosis not present

## 2016-08-09 LAB — CBC
HCT: 42 % (ref 39.0–52.0)
HEMOGLOBIN: 15.1 g/dL (ref 13.0–17.0)
MCH: 30.3 pg (ref 26.0–34.0)
MCHC: 36 g/dL (ref 30.0–36.0)
MCV: 84.2 fL (ref 78.0–100.0)
Platelets: 388 10*3/uL (ref 150–400)
RBC: 4.99 MIL/uL (ref 4.22–5.81)
RDW: 13.1 % (ref 11.5–15.5)
WBC: 13.8 10*3/uL — ABNORMAL HIGH (ref 4.0–10.5)

## 2016-08-09 LAB — LIPASE, BLOOD: Lipase: 16 U/L (ref 11–51)

## 2016-08-09 LAB — URINALYSIS, ROUTINE W REFLEX MICROSCOPIC
BILIRUBIN URINE: NEGATIVE
Bacteria, UA: NONE SEEN
Glucose, UA: NEGATIVE mg/dL
Ketones, ur: NEGATIVE mg/dL
Nitrite: NEGATIVE
Protein, ur: NEGATIVE mg/dL
Specific Gravity, Urine: 1.002 — ABNORMAL LOW (ref 1.005–1.030)
Squamous Epithelial / LPF: NONE SEEN
pH: 7 (ref 5.0–8.0)

## 2016-08-09 LAB — COMPREHENSIVE METABOLIC PANEL
ALBUMIN: 3.7 g/dL (ref 3.5–5.0)
ALT: 22 U/L (ref 17–63)
ANION GAP: 8 (ref 5–15)
AST: 18 U/L (ref 15–41)
Alkaline Phosphatase: 85 U/L (ref 38–126)
BUN: 7 mg/dL (ref 6–20)
CO2: 24 mmol/L (ref 22–32)
Calcium: 9 mg/dL (ref 8.9–10.3)
Chloride: 102 mmol/L (ref 101–111)
Creatinine, Ser: 0.83 mg/dL (ref 0.61–1.24)
GFR calc Af Amer: >60 mL/min (ref >60)
GFR calc non Af Amer: >60 mL/min (ref >60)
GLUCOSE: 115 mg/dL — AB (ref 65–99)
POTASSIUM: 3.8 mmol/L (ref 3.5–5.1)
SODIUM: 134 mmol/L — AB (ref 135–145)
Total Bilirubin: 0.6 mg/dL (ref 0.3–1.2)
Total Protein: 8.7 g/dL — ABNORMAL HIGH (ref 6.5–8.1)

## 2016-08-09 MED ORDER — SODIUM CHLORIDE 0.9 % IV BOLUS (SEPSIS)
1000.0000 mL | Freq: Once | INTRAVENOUS | Status: AC
  Administered 2016-08-09: 1000 mL via INTRAVENOUS

## 2016-08-09 MED ORDER — ONDANSETRON 4 MG PO TBDP: 4.0000 mg | ORAL_TABLET | Freq: Three times a day (TID) | ORAL | 0 refills | Status: DC | PRN

## 2016-08-09 MED ORDER — ONDANSETRON HCL 4 MG/2ML IJ SOLN
4.0000 mg | Freq: Once | INTRAMUSCULAR | Status: AC
  Administered 2016-08-09: 4 mg via INTRAVENOUS
  Filled 2016-08-09: qty 2

## 2016-08-09 NOTE — ED Provider Notes (Signed)
[facility]-EMERGENCY DEPT Provider Note   CSN: [id number] Arrival date & time: 08/09/16  1026     History   Chief Complaint Chief Complaint  Patient presents with  . Nausea  . Emesis  . Diarrhea    HPI Jacob Gamel. is a 26 y.o. male.  Patient with recent admission for cellulitis of hand (d/c 2 days ago) -- presents with 2 day history of N/V/D. Patient reported some loose stools at the end of his hospital stay but then began vomiting. He was on clindamycin while in the hospital. He admits to not filling this medication since he has been home and has not had this in the past 2 days. He reports having watery stool every 30 minutes to an hour. No blood in stool. No blood in vomit. He has been unable to keep down solids and liquids. No abdominal pain except for cramping when he is vomiting. No history of abdominal surgeries. The onset of this condition was acute. The course is constant. Aggravating factors: none. Alleviating factors: none.        Past Medical History:  Diagnosis Date  . Bronchitis   . Cellulitis of right hand - RECURRENT   . Kidney stones   . Kidney stones     Patient Active Problem List   Diagnosis Date Noted  . Cellulitis of hand 08/04/2016  . Carpal tunnel syndrome on left 05/08/2016  . Abscess of upper arm   . Cellulitis 01/22/2016  . Leukocytosis 01/22/2016  . Neuropathy (HCC) 01/22/2016  . Nephrolithiasis 01/22/2016  . Cellulitis of right upper extremity   . Cellulitis of forearm, right 07/30/2015    Past Surgical History:  Procedure Laterality Date  . CARPAL TUNNEL RELEASE Left 04/09/2016   Procedure: CARPAL TUNNEL RELEASE;  Surgeon: Cammy Copa, MD;  Location: [facility] OR;  Service: Orthopedics;  Laterality: Left;  . HAND SURGERY    . KIDNEY STONE SURGERY     lithortripsy       Home Medications    Prior to Admission medications   Medication Sig Start Date End Date Taking? Authorizing Provider  acetaminophen (TYLENOL) 500 MG tablet  Take 500 mg by mouth every 6 (six) hours as needed for mild pain or headache.   Yes Historical Provider, MD  traMADol (ULTRAM) 50 MG tablet Take 1 tablet (50 mg total) by mouth every 6 (six) hours as needed. Patient taking differently: Take 50 mg by mouth every 6 (six) hours as needed for moderate pain.  08/03/16  Yes Kristen N Ward, DO  clindamycin (CLEOCIN) 300 MG capsule Take 1 capsule (300 mg total) by mouth every 8 (eight) hours. Patient not taking: Reported on 08/09/2016 08/07/16 08/14/16  Lenox Ponds, MD  ibuprofen (ADVIL,MOTRIN) 600 MG tablet Take 1 tablet (600 mg total) by mouth 3 (three) times daily. Patient not taking: Reported on 08/09/2016 08/07/16 08/10/16  Lenox Ponds, MD  ondansetron (ZOFRAN ODT) 4 MG disintegrating tablet Take 1 tablet (4 mg total) by mouth every 8 (eight) hours as needed for nausea or vomiting. Patient not taking: Reported on 08/09/2016 08/03/16   Layla Maw Ward, DO    Family History Family History  Problem Relation Age of Onset  . Hypertension Other   . Diabetes Father     Social History Social History  Substance Use Topics  . Smoking status: Former Games developer  . Smokeless tobacco: Never Used     Comment: States he "vapes"  . Alcohol use No  Comment: occasionally      Allergies   Coconut flavor and No known allergies   Review of Systems Review of Systems  Constitutional: Negative for appetite change and fever.  HENT: Negative for rhinorrhea and sore throat.   Eyes: Negative for redness.  Respiratory: Negative for cough.   Cardiovascular: Negative for chest pain.  Gastrointestinal: Positive for diarrhea, nausea and vomiting. Negative for abdominal pain and blood in stool.       Negative for hematemesis  Genitourinary: Negative for dysuria.  Musculoskeletal: Negative for myalgias.  Skin: Negative for rash.  Neurological: Negative for light-headedness and headaches.     Physical Exam Updated Vital Signs BP 123/77 (BP Location:  Left Arm)   Pulse 89   Temp 98.5 F (36.9 C) (Oral)   Resp 14   Ht 5\' 11"  (1.803 m)   Wt 113.4 kg   SpO2 100%   BMI 34.87 kg/m   Physical Exam  Constitutional: He appears well-developed and well-nourished.  HENT:  Head: Normocephalic and atraumatic.  Eyes: Conjunctivae are normal. Right eye exhibits no discharge. Left eye exhibits no discharge.  Neck: Normal range of motion. Neck supple.  Cardiovascular: Normal rate, regular rhythm and normal heart sounds.   Pulmonary/Chest: Effort normal and breath sounds normal.  Abdominal: Soft. There is no tenderness.  Neurological: He is alert.  Skin: Skin is warm and dry.  Psychiatric: He has a normal mood and affect.  Nursing note and vitals reviewed.    ED Treatments / Results  Labs (all labs ordered are listed, but only abnormal results are displayed) Labs Reviewed  COMPREHENSIVE METABOLIC PANEL - Abnormal; Notable for the following:       Result Value   Sodium 134 (*)    Glucose, Bld 115 (*)    Total Protein 8.7 (*)    All other components within normal limits  CBC - Abnormal; Notable for the following:    WBC 13.8 (*)    All other components within normal limits  URINALYSIS, ROUTINE W REFLEX MICROSCOPIC - Abnormal; Notable for the following:    Color, Urine STRAW (*)    Specific Gravity, Urine 1.002 (*)    Hgb urine dipstick SMALL (*)    Leukocytes, UA TRACE (*)    All other components within normal limits  C DIFFICILE QUICK SCREEN W PCR REFLEX  LIPASE, BLOOD    Procedures Procedures (including critical care time)  Medications Ordered in ED Medications  sodium chloride 0.9 % bolus 1,000 mL (0 mLs Intravenous Stopped 08/09/16 1336)  ondansetron (ZOFRAN) injection 4 mg (4 mg Intravenous Given 08/09/16 1339)     Initial Impression / Assessment and Plan / ED Course  I have reviewed the triage vital signs and the nursing notes.  Pertinent labs & imaging results that were available during my care of the patient were  reviewed by me and considered in my medical decision making (see chart for details).     Patient seen and examined. Work-up initiated.   Vital signs reviewed and are as follows: BP 123/77 (BP Location: Left Arm)   Pulse 89   Temp 98.5 F (36.9 C) (Oral)   Resp 14   Ht 5\' 11"  (1.803 m)   Wt 113.4 kg   SpO2 100%   BMI 34.87 kg/m   4:32 PM Patient monitored in ED for 5 hours. He has not had any further bowel movements. Unable to send C. difficile. Patient is tolerating fluids and pasta in the room.  Feel safe  for discharge home at this time. Will d/c with zofran.   The patient was urged to return to the Emergency Department immediately with worsening of current symptoms, worsening abdominal pain, persistent vomiting, blood noted in stools, fever, or any other concerns. The patient verbalized understanding.     Final Clinical Impressions(s) / ED Diagnoses   Final diagnoses:  Nausea vomiting and diarrhea   Patient with nausea, vomiting, and diarrhea after recent admission. C. difficile is on the differential, however patient was unable to have any further bowel movements while here in emergency department today. He is improved with IV fluids and Zofran. He is tolerating oral fluids and solids. Abdominal exam without significant or focal tenderness.  Vitals are stable, no fever. Labs reassuring. No signs of dehydration, patient is tolerating PO's. Lungs are clear and no signs suggestive of PNA. Low concern for appendicitis, cholecystitis, pancreatitis, ruptured viscus, UTI, kidney stone, aortic dissection, aortic aneurysm or other emergent abdominal etiology. Supportive therapy indicated with return if symptoms worsen.    New Prescriptions New Prescriptions   ONDANSETRON (ZOFRAN ODT) 4 MG DISINTEGRATING TABLET    Take 1 tablet (4 mg total) by mouth every 8 (eight) hours as needed for nausea or vomiting.     Renne Crigler, PA-C 08/09/16 1634    Canary Brim Tegeler,  MD 08/09/16 2009

## 2016-08-09 NOTE — ED Notes (Signed)
RN at bedside starting IV will collect labs 

## 2016-08-09 NOTE — ED Triage Notes (Signed)
Patient reports today with nausea, vomiting,  and "orange" diarrhea since Wednesday. Patient states "I can't keep anything down". Patient was recently discharged on Wednesday from cellulitis of right hand and arm.

## 2016-08-09 NOTE — Discharge Instructions (Signed)
Please read and follow all provided instructions.  Your diagnoses today include:  1. Nausea vomiting and diarrhea    Tests performed today include:  Blood counts and electrolytes  Blood tests to check liver and kidney function  Blood tests to check pancreas function  Urine test to look for infection and pregnancy (in women)  Vital signs. See below for your results today.   Medications prescribed:   Zofran (ondansetron) - for nausea and vomiting  Take any prescribed medications only as directed.  Home care instructions:   Follow any educational materials contained in this packet.   Keep drinking plenty of fluids and use the medicine for nausea as directed.    Drink clear liquids for the next 24 hours and introduce solid foods slowly after 24 hours using the b.r.a.t. diet (Bananas, Rice, Applesauce, Toast, Yogurt).    Follow-up instructions: Please follow-up with your primary care provider in the next 2 days for further evaluation of your symptoms. If you are not feeling better in 48 hours you may have a condition that is more serious and you need re-evaluation.   Return instructions:  SEEK IMMEDIATE MEDICAL ATTENTION IF:  If you have pain that does not go away or becomes severe   A temperature above 101F develops   Repeated vomiting occurs (multiple episodes)   If you have pain that becomes localized to portions of the abdomen. The right side could possibly be appendicitis. In an adult, the left lower portion of the abdomen could be colitis or diverticulitis.   Blood is being passed in stools or vomit (bright red or black tarry stools)   You develop chest pain, difficulty breathing, dizziness or fainting, or become confused, poorly responsive, or inconsolable (young children)  If you have any other emergent concerns regarding your health  Additional Information: Abdominal (belly) pain can be caused by many things. Your caregiver performed an examination and possibly  ordered blood/urine tests and imaging (CT scan, x-rays, ultrasound). Many cases can be observed and treated at home after initial evaluation in the emergency department. Even though you are being discharged home, abdominal pain can be unpredictable. Therefore, you need a repeated exam if your pain does not resolve, returns, or worsens. Most patients with abdominal pain don't have to be admitted to the hospital or have surgery, but serious problems like appendicitis and gallbladder attacks can start out as nonspecific pain. Many abdominal conditions cannot be diagnosed in one visit, so follow-up evaluations are very important.  Your vital signs today were: BP 128/73 (BP Location: Left Arm)    Pulse 86    Temp 98.5 F (36.9 C) (Oral)    Resp 18    Ht 5\' 11"  (1.803 m)    Wt 113.4 kg    SpO2 99%    BMI 34.87 kg/m  If your blood pressure (bp) was elevated above 135/85 this visit, please have this repeated by your doctor within one month. --------------

## 2016-08-10 LAB — CULTURE, BLOOD (ROUTINE X 2)
CULTURE: NO GROWTH
CULTURE: NO GROWTH

## 2016-09-17 ENCOUNTER — Emergency Department (HOSPITAL_COMMUNITY)
Admission: EM | Admit: 2016-09-17 | Discharge: 2016-09-17 | Disposition: A | Discharge status: Home / Self Care | Payer: Medicaid Other | Attending: Emergency Medicine | Admitting: Emergency Medicine

## 2016-09-17 ENCOUNTER — Encounter (HOSPITAL_COMMUNITY): Payer: Self-pay | Admitting: *Deleted

## 2016-09-17 DIAGNOSIS — K529 Noninfective gastroenteritis and colitis, unspecified: Secondary | ICD-10-CM

## 2016-09-17 DIAGNOSIS — Z87891 Personal history of nicotine dependence: Secondary | ICD-10-CM | POA: Insufficient documentation

## 2016-09-17 DIAGNOSIS — Z79899 Other long term (current) drug therapy: Secondary | ICD-10-CM | POA: Insufficient documentation

## 2016-09-17 MED ORDER — SODIUM CHLORIDE 0.9 % IV BOLUS (SEPSIS)
1000.0000 mL | Freq: Once | INTRAVENOUS | Status: AC
  Administered 2016-09-17: 1000 mL via INTRAVENOUS

## 2016-09-17 MED ORDER — ONDANSETRON HCL 4 MG/2ML IJ SOLN
4.0000 mg | Freq: Once | INTRAMUSCULAR | Status: AC
  Administered 2016-09-17: 4 mg via INTRAVENOUS
  Filled 2016-09-17: qty 2

## 2016-09-17 MED ORDER — HYDROCODONE-ACETAMINOPHEN 5-325 MG PO TABS: 1.0000 | ORAL_TABLET | ORAL | 0 refills | Status: DC | PRN

## 2016-09-17 MED ORDER — ONDANSETRON HCL 8 MG PO TABS: 8.0000 mg | ORAL_TABLET | Freq: Three times a day (TID) | ORAL | 0 refills | Status: DC | PRN

## 2016-09-17 NOTE — ED Notes (Signed)
Pt given ginger ale and water 

## 2016-09-17 NOTE — ED Triage Notes (Signed)
Pt reports vomiting x 6 since this morning.  Pt reports two episodes of diarrhea. Pt has not been able to tolerate PO fluids at home.  Pt reports he took his wife's ODT zofran with no relief.  Pt also states he feels weak but is ambulatory in triage.  Pt had chills last night.

## 2016-09-17 NOTE — Discharge Instructions (Signed)
Medication for pain and nausea. Increase fluids. Rest.

## 2016-09-18 NOTE — ED Provider Notes (Signed)
[facility]-EMERGENCY DEPT Provider Note   CSN: [id number] Arrival date & time: 09/17/16  1012     History   Chief Complaint Chief Complaint  Patient presents with  . Emesis    HPI Jacob Nixon. is a 26 y.o. male.  Patient reports several episodes of nausea, vomiting, diarrhea since 6 PM last night. Decreased oral intake. He has aken Zofran with minimal relief. He feels weak, but is ambulatory. Severity of symptoms mild to moderate.      Past Medical History:  Diagnosis Date  . Bronchitis   . Cellulitis of right hand - RECURRENT   . Kidney stones   . Kidney stones     Patient Active Problem List   Diagnosis Date Noted  . Cellulitis of hand 08/04/2016  . Carpal tunnel syndrome on left 05/08/2016  . Abscess of upper arm   . Cellulitis 01/22/2016  . Leukocytosis 01/22/2016  . Neuropathy 01/22/2016  . Nephrolithiasis 01/22/2016  . Cellulitis of right upper extremity   . Cellulitis of forearm, right 07/30/2015    Past Surgical History:  Procedure Laterality Date  . CARPAL TUNNEL RELEASE Left 04/09/2016   Procedure: CARPAL TUNNEL RELEASE;  Surgeon: Cammy Copa, MD;  Location: [facility] OR;  Service: Orthopedics;  Laterality: Left;  . HAND SURGERY    . KIDNEY STONE SURGERY     lithortripsy       Home Medications    Prior to Admission medications   Medication Sig Start Date End Date Taking? Authorizing Provider  ondansetron (ZOFRAN ODT) 4 MG disintegrating tablet Take 1 tablet (4 mg total) by mouth every 8 (eight) hours as needed for nausea or vomiting. 08/09/16  Yes Renne Crigler, PA-C  HYDROcodone-acetaminophen (NORCO/VICODIN) 5-325 MG tablet Take 1-2 tablets by mouth every 4 (four) hours as needed. 09/17/16   Donnetta Hutching, MD  ondansetron (ZOFRAN) 8 MG tablet Take 1 tablet (8 mg total) by mouth every 8 (eight) hours as needed for nausea or vomiting. 09/17/16   Donnetta Hutching, MD  traMADol (ULTRAM) 50 MG tablet Take 1 tablet (50 mg total) by mouth every 6 (six) hours as  needed. Patient not taking: Reported on 09/17/2016 08/03/16   Layla Maw Ward, DO    Family History Family History  Problem Relation Age of Onset  . Hypertension Other   . Diabetes Father     Social History Social History  Substance Use Topics  . Smoking status: Former Games developer  . Smokeless tobacco: Never Used     Comment: States he "vapes"  . Alcohol use No     Comment: occasionally      Allergies   Coconut flavor and No known allergies   Review of Systems Review of Systems  All other systems reviewed and are negative.    Physical Exam Updated Vital Signs BP 110/63   Pulse (!) 105   Temp 97.8 F (36.6 C) (Oral)   Resp 18   Ht  (1.803 m)   Wt 250 lb (113.4 kg)   SpO2 98%   BMI 34.87 kg/m   Physical Exam  Constitutional: He is oriented to person, place, and time.  Dry mucous membranes, but alert and interactive.  HENT:  Head: Normocephalic and atraumatic.  Eyes: Conjunctivae are normal.  Neck: Neck supple.  Cardiovascular: Normal rate and regular rhythm.   Pulmonary/Chest: Effort normal and breath sounds normal.  Abdominal: Soft. Bowel sounds are normal.  Musculoskeletal: Normal range of motion.  Neurological: He is alert and oriented to  person, place, and time.  Skin: Skin is warm and dry.  Psychiatric: He has a normal mood and affect. His behavior is normal.  Nursing note and vitals reviewed.    ED Treatments / Results  Labs (all labs ordered are listed, but only abnormal results are displayed) Labs Reviewed - No data to display  EKG  EKG Interpretation None       Radiology No results found.  Procedures Procedures (including critical care time)  Medications Ordered in ED Medications  sodium chloride 0.9 % bolus 1,000 mL (0 mLs Intravenous Stopped 09/17/16 1422)  ondansetron (ZOFRAN) injection 4 mg (4 mg Intravenous Given 09/17/16 1212)  sodium chloride 0.9 % bolus 1,000 mL (0 mLs Intravenous Stopped 09/17/16 1422)  ondansetron (ZOFRAN)  injection 4 mg (4 mg Intravenous Given 09/17/16 1448)     Initial Impression / Assessment and Plan / ED Course  I have reviewed the triage vital signs and the nursing notes.  Pertinent labs & imaging results that were available during my care of the patient were reviewed by me and considered in my medical decision making (see chart for details).     Patient feels much better after 2 L of IV fluids and IV Zofran. He is nontoxic-appearing at discharge.  Final Clinical Impressions(s) / ED Diagnoses   Final diagnoses:  Gastroenteritis    New Prescriptions Discharge Medication List as of 09/17/2016  2:39 PM    START taking these medications   Details  HYDROcodone-acetaminophen (NORCO/VICODIN) 5-325 MG tablet Take 1-2 tablets by mouth every 4 (four) hours as needed., Starting Tue 09/17/2016, Print    ondansetron (ZOFRAN) 8 MG tablet Take 1 tablet (8 mg total) by mouth every 8 (eight) hours as needed for nausea or vomiting., Starting Tue 09/17/2016, Print         Donnetta Hutching, MD 09/18/16 [phone]

## 2016-10-15 DIAGNOSIS — R7303 Prediabetes: Secondary | ICD-10-CM | POA: Insufficient documentation

## 2016-10-15 DIAGNOSIS — M7989 Other specified soft tissue disorders: Secondary | ICD-10-CM | POA: Insufficient documentation

## 2016-10-25 ENCOUNTER — Inpatient Hospital Stay (HOSPITAL_COMMUNITY)
Admission: EM | Admit: 2016-10-25 | Discharge: 2016-10-27 | DRG: 603 | Payer: BLUE CROSS/BLUE SHIELD | Attending: Internal Medicine | Admitting: Internal Medicine

## 2016-10-25 ENCOUNTER — Encounter (HOSPITAL_COMMUNITY): Payer: Self-pay | Admitting: Emergency Medicine

## 2016-10-25 DIAGNOSIS — M2540 Effusion, unspecified joint: Secondary | ICD-10-CM

## 2016-10-25 DIAGNOSIS — L0390 Cellulitis, unspecified: Secondary | ICD-10-CM

## 2016-10-25 DIAGNOSIS — R7303 Prediabetes: Secondary | ICD-10-CM | POA: Diagnosis present

## 2016-10-25 DIAGNOSIS — Z9102 Food additives allergy status: Secondary | ICD-10-CM

## 2016-10-25 DIAGNOSIS — Z833 Family history of diabetes mellitus: Secondary | ICD-10-CM

## 2016-10-25 DIAGNOSIS — E876 Hypokalemia: Secondary | ICD-10-CM | POA: Diagnosis present

## 2016-10-25 DIAGNOSIS — R7309 Other abnormal glucose: Secondary | ICD-10-CM | POA: Diagnosis present

## 2016-10-25 DIAGNOSIS — E861 Hypovolemia: Secondary | ICD-10-CM | POA: Diagnosis present

## 2016-10-25 DIAGNOSIS — R739 Hyperglycemia, unspecified: Secondary | ICD-10-CM | POA: Diagnosis present

## 2016-10-25 DIAGNOSIS — Z79899 Other long term (current) drug therapy: Secondary | ICD-10-CM

## 2016-10-25 DIAGNOSIS — L03113 Cellulitis of right upper limb: Principal | ICD-10-CM

## 2016-10-25 DIAGNOSIS — E871 Hypo-osmolality and hyponatremia: Secondary | ICD-10-CM | POA: Diagnosis present

## 2016-10-25 DIAGNOSIS — Z87442 Personal history of urinary calculi: Secondary | ICD-10-CM

## 2016-10-25 DIAGNOSIS — Z87891 Personal history of nicotine dependence: Secondary | ICD-10-CM

## 2016-10-25 LAB — URINALYSIS, ROUTINE W REFLEX MICROSCOPIC
Bacteria, UA: NONE SEEN
Bilirubin Urine: NEGATIVE
GLUCOSE, UA: NEGATIVE mg/dL
Ketones, ur: 5 mg/dL — AB
Leukocytes, UA: NEGATIVE
Nitrite: NEGATIVE
PROTEIN: 30 mg/dL — AB
Specific Gravity, Urine: 1.024 (ref 1.005–1.030)
Squamous Epithelial / LPF: NONE SEEN
pH: 5 (ref 5.0–8.0)

## 2016-10-25 LAB — CBC WITH DIFFERENTIAL/PLATELET
Basophils Absolute: 0 10*3/uL (ref 0.0–0.1)
Basophils Relative: 0 %
EOS ABS: 0 10*3/uL (ref 0.0–0.7)
EOS PCT: 0 %
HCT: 40.5 % (ref 39.0–52.0)
Hemoglobin: 14.1 g/dL (ref 13.0–17.0)
LYMPHS ABS: 2.9 10*3/uL (ref 0.7–4.0)
Lymphocytes Relative: 15 %
MCH: 29.4 pg (ref 26.0–34.0)
MCHC: 34.8 g/dL (ref 30.0–36.0)
MCV: 84.6 fL (ref 78.0–100.0)
MONO ABS: 1.3 10*3/uL — AB (ref 0.1–1.0)
MONOS PCT: 7 %
Neutro Abs: 14.6 10*3/uL — ABNORMAL HIGH (ref 1.7–7.7)
Neutrophils Relative %: 78 %
Platelets: 307 10*3/uL (ref 150–400)
RBC: 4.79 MIL/uL (ref 4.22–5.81)
RDW: 13.9 % (ref 11.5–15.5)
WBC: 18.9 10*3/uL — ABNORMAL HIGH (ref 4.0–10.5)

## 2016-10-25 LAB — COMPREHENSIVE METABOLIC PANEL
ALT: 22 U/L (ref 17–63)
AST: 20 U/L (ref 15–41)
Albumin: 3.7 g/dL (ref 3.5–5.0)
Alkaline Phosphatase: 81 U/L (ref 38–126)
Anion gap: 12 (ref 5–15)
BUN: 9 mg/dL (ref 6–20)
CHLORIDE: 101 mmol/L (ref 101–111)
CO2: 20 mmol/L — AB (ref 22–32)
Calcium: 8.6 mg/dL — ABNORMAL LOW (ref 8.9–10.3)
Creatinine, Ser: 0.95 mg/dL (ref 0.61–1.24)
GFR calc Af Amer: >60 mL/min (ref >60)
GFR calc non Af Amer: >60 mL/min (ref >60)
Glucose, Bld: 168 mg/dL — ABNORMAL HIGH (ref 65–99)
Potassium: 3.4 mmol/L — ABNORMAL LOW (ref 3.5–5.1)
Sodium: 133 mmol/L — ABNORMAL LOW (ref 135–145)
Total Bilirubin: 1.3 mg/dL — ABNORMAL HIGH (ref 0.3–1.2)
Total Protein: 7.8 g/dL (ref 6.5–8.1)

## 2016-10-25 LAB — I-STAT CG4 LACTIC ACID, ED: Lactic Acid, Venous: 1.66 mmol/L (ref 0.5–1.9)

## 2016-10-25 MED ORDER — KETOROLAC TROMETHAMINE 30 MG/ML IJ SOLN
30.0000 mg | Freq: Once | INTRAMUSCULAR | Status: AC
  Administered 2016-10-25: 30 mg via INTRAVENOUS
  Filled 2016-10-25: qty 1

## 2016-10-25 MED ORDER — SODIUM CHLORIDE 0.9 % IV BOLUS (SEPSIS)
1000.0000 mL | Freq: Once | INTRAVENOUS | Status: AC
  Administered 2016-10-25: 1000 mL via INTRAVENOUS

## 2016-10-25 MED ORDER — MORPHINE SULFATE (PF) 2 MG/ML IV SOLN
4.0000 mg | Freq: Once | INTRAVENOUS | Status: AC
  Administered 2016-10-25: 4 mg via INTRAVENOUS
  Filled 2016-10-25: qty 2

## 2016-10-25 MED ORDER — VANCOMYCIN HCL IN DEXTROSE 1-5 GM/200ML-% IV SOLN
1000.0000 mg | Freq: Once | INTRAVENOUS | Status: AC
  Administered 2016-10-25: 1000 mg via INTRAVENOUS
  Filled 2016-10-25: qty 200

## 2016-10-25 NOTE — ED Triage Notes (Signed)
Patient c/o fever onset 2 days ago. Pt states [facility] and had course of amoxicillin. Pt states worsening right hand swelling traveling up his forearm onset of yesterday. Pt states been told he has cellulitis, but has been to many specialists that state he does not have cellulitis. Pt reports fever of 102 PTA and took 625 mg tylenol about 2 hours ago.

## 2016-10-25 NOTE — ED Provider Notes (Signed)
[facility]-EMERGENCY DEPT Provider Note   CSN: [id number] Arrival date & time: 10/25/16  2206     History   Chief Complaint Chief Complaint  Patient presents with  . Fever  . Hand swelling    HPI Jacob Morasdrian L Orama Jr. is a 26 y.o. male.  The history is provided by the patient.  He comes in because of pain and swelling in his right hand as well as fever and chills. He had been admitted to the hospital in March with cellulitis of hand. This was originally treated with outpatient antibiotics and he came in because of failure of outpatient antibiotics. He was doing well until 2 days ago when he noted sore throat. He went to urgent care where strep screen was negative, but they put him on amoxicillin anyway. Yesterday, he started running fevers. Temperature is been as high as 101.8 at home. This is been associated with chills and sweats. He is also complaining of pain in the right hand and swelling of the right hand. Pain is rated at 10/10. He is taken acetaminophen for fever and it has been mildly successful with temperature coming down to 100.4. Of note, he no longer is complaining of sore throat.  Past Medical History:  Diagnosis Date  . Bronchitis   . Cellulitis of right hand - RECURRENT   . Kidney stones   . Kidney stones     Patient Active Problem List   Diagnosis Date Noted  . Cellulitis of hand 08/04/2016  . Carpal tunnel syndrome on left 05/08/2016  . Abscess of upper arm   . Cellulitis 01/22/2016  . Leukocytosis 01/22/2016  . Neuropathy 01/22/2016  . Nephrolithiasis 01/22/2016  . Cellulitis of right upper extremity   . Cellulitis of forearm, right 07/30/2015    Past Surgical History:  Procedure Laterality Date  . CARPAL TUNNEL RELEASE Left 04/09/2016   Procedure: CARPAL TUNNEL RELEASE;  Surgeon: Cammy CopaScott Gregory [name], MD;  [facility];  Service: Orthopedics;  Laterality: Left;  . HAND SURGERY    . KIDNEY STONE SURGERY     lithortripsy       Home Medications     Prior to Admission medications   Medication Sig Start Date End Date Taking? Authorizing Provider  acetaminophen (TYLENOL) 650 MG CR tablet Take 1,300 mg by mouth every 8 (eight) hours as needed for pain.   Yes [provider]  amoxicillin (AMOXIL) 500 MG capsule Take 500 mg by mouth 2 (two) times daily.   Yes [provider]  HYDROcodone-acetaminophen (NORCO/VICODIN) 5-325 MG tablet Take 1-2 tablets by mouth every 4 (four) hours as needed. 09/17/16  Yes Donnetta Hutchingook, Brian, MD  ondansetron (ZOFRAN ODT) 4 MG disintegrating tablet Take 1 tablet (4 mg total) by mouth every 8 (eight) hours as needed for nausea or vomiting. Patient not taking: Reported on 10/25/2016 08/09/16   Renne CriglerGeiple, Joshua, PA-C  ondansetron (ZOFRAN) 8 MG tablet Take 1 tablet (8 mg total) by mouth every 8 (eight) hours as needed for nausea or vomiting. Patient not taking: Reported on 10/25/2016 09/17/16   Donnetta Hutchingook, Brian, MD  traMADol (ULTRAM) 50 MG tablet Take 1 tablet (50 mg total) by mouth every 6 (six) hours as needed. Patient not taking: Reported on 09/17/2016 08/03/16   Ward, Layla MawKristen N, DO    Family History Family History  Problem Relation Age of Onset  . Hypertension Other   . Diabetes Father     Social History Social History  Substance Use Topics  . Smoking status: Former  Smoker  . Smokeless tobacco: Never Used     Comment: States he "vapes"  . Alcohol use No     Comment: occasionally      Allergies   Coconut flavor and No known allergies   Review of Systems Review of Systems  All other systems reviewed and are negative.    Physical Exam Updated Vital Signs BP 135/81 (BP Location: Left Arm)   Pulse (!) 121   Temp 99.9 F (37.7 C) (Oral)   Resp 20   Ht 5\' 11"  (1.803 m)   Wt 114.8 kg (253 lb)   SpO2 100%   BMI 35.29 kg/m   Physical Exam  Nursing note and vitals reviewed.  26 year old male, resting comfortably and in no acute distress. Vital signs are significant for fever and tachycardia.  Oxygen saturation is 100%, which is normal. Head is normocephalic and atraumatic. PERRLA, EOMI. Oropharynx is clear. No tonsillar hypertrophy or exudate. Neck is nontender and supple without adenopathy or JVD. Back is nontender and there is no CVA tenderness. Lungs are clear without rales, wheezes, or rhonchi. Chest is nontender. Heart has regular rate and rhythm without murmur. Abdomen is soft, flat, nontender without masses or hepatosplenomegaly and peristalsis is normoactive. Extremities: Right hand is swollen to just proximal to the wrist. There is associated erythema and warmth, but no lymphangitic streaks. There are some areas of desquamation. Remainder of extremity exam is normal. Skin is warm and dry without other rash. Neurologic: Mental status is normal, cranial nerves are intact, there are no motor or sensory deficits.  ED Treatments / Results  Labs (all labs ordered are listed, but only abnormal results are displayed) Labs Reviewed  COMPREHENSIVE METABOLIC PANEL - Abnormal; Notable for the following:       Result Value   Sodium 133 (*)    Potassium 3.4 (*)    CO2 20 (*)    Glucose, Bld 168 (*)    Calcium 8.6 (*)    Total Bilirubin 1.3 (*)    All other components within normal limits  CBC WITH DIFFERENTIAL/PLATELET - Abnormal; Notable for the following:    WBC 18.9 (*)    Neutro Abs 14.6 (*)    Monocytes Absolute 1.3 (*)    All other components within normal limits  URINALYSIS, ROUTINE W REFLEX MICROSCOPIC - Abnormal; Notable for the following:    Hgb urine dipstick SMALL (*)    Ketones, ur 5 (*)    Protein, ur 30 (*)    All other components within normal limits  CULTURE, BLOOD (ROUTINE X 2)  CULTURE, BLOOD (ROUTINE X 2)  I-STAT CG4 LACTIC ACID, ED  I-STAT CG4 LACTIC ACID, ED    Procedures Procedures (including critical care time)  Medications Ordered in ED Medications  0.9 %  sodium chloride infusion (not administered)  potassium chloride SA (K-DUR,KLOR-CON)  CR tablet 20 mEq (not administered)  sodium chloride 0.9 % bolus 1,000 mL (0 mLs Intravenous Stopped 10/26/16 0104)  vancomycin (VANCOCIN) IVPB 1000 mg/200 mL premix (0 mg Intravenous Stopped 10/26/16 0104)  ketorolac (TORADOL) 30 MG/ML injection 30 mg (30 mg Intravenous Given 10/25/16 2332)  morphine 2 MG/ML injection 4 mg (4 mg Intravenous Given 10/25/16 2336)  ondansetron (ZOFRAN) injection 4 mg (4 mg Intravenous Given 10/26/16 0106)  HYDROmorphone (DILAUDID) injection 1 mg (1 mg Intravenous Given 10/26/16 0109)     Initial Impression / Assessment and Plan / ED Course  I have reviewed the triage vital signs and the nursing notes.  Pertinent lab results that were available during my care of the patient were reviewed by me and considered in my medical decision making (see chart for details).  Fever with erythema and swelling of the right hand strongly suggestive of recurrent cellulitis. Old records reviewed confirming hospitalization for several is in the right hand and March of this year. Diabetes be serious come back very high at 18.9. Lactic acid level is normal. He does not appear toxic, but is having intermittent chills during the exam. He is given IV fluids and blood cultures are obtained. Is given initial dose of vancomycin. I have recommended hospitalization, but is reluctant. This will be discussed again after he completes his fluids and antibiotics.He will be given ketorolac and morphine for pain.  He only got momentary pain relief with above noted medications. He is given hydromorphone for pain. He is agreeable to be admitted. Case is discussed with Dr. Antionette Char [facility] who agrees to admit the patient. Incidental finding of elevated glucose will need to be followed closely as patient could be new onset type II diabetic.  Final Clinical Impressions(s) / ED Diagnoses   Final diagnoses:  Cellulitis of right hand  Elevated glucose level    New Prescriptions New Prescriptions   No  medications on file     Dione Booze, MD 10/26/16 0116

## 2016-10-26 ENCOUNTER — Encounter (HOSPITAL_COMMUNITY): Payer: Self-pay | Admitting: Family Medicine

## 2016-10-26 DIAGNOSIS — R7309 Other abnormal glucose: Secondary | ICD-10-CM | POA: Diagnosis present

## 2016-10-26 DIAGNOSIS — Z87442 Personal history of urinary calculi: Secondary | ICD-10-CM | POA: Diagnosis not present

## 2016-10-26 DIAGNOSIS — L03113 Cellulitis of right upper limb: Secondary | ICD-10-CM | POA: Diagnosis present

## 2016-10-26 DIAGNOSIS — E861 Hypovolemia: Secondary | ICD-10-CM | POA: Diagnosis present

## 2016-10-26 DIAGNOSIS — E871 Hypo-osmolality and hyponatremia: Secondary | ICD-10-CM | POA: Diagnosis present

## 2016-10-26 DIAGNOSIS — R739 Hyperglycemia, unspecified: Secondary | ICD-10-CM | POA: Diagnosis present

## 2016-10-26 DIAGNOSIS — Z87891 Personal history of nicotine dependence: Secondary | ICD-10-CM | POA: Diagnosis not present

## 2016-10-26 DIAGNOSIS — Z9102 Food additives allergy status: Secondary | ICD-10-CM | POA: Diagnosis not present

## 2016-10-26 DIAGNOSIS — E876 Hypokalemia: Secondary | ICD-10-CM | POA: Diagnosis present

## 2016-10-26 DIAGNOSIS — Z79899 Other long term (current) drug therapy: Secondary | ICD-10-CM | POA: Diagnosis not present

## 2016-10-26 DIAGNOSIS — Z833 Family history of diabetes mellitus: Secondary | ICD-10-CM | POA: Diagnosis not present

## 2016-10-26 DIAGNOSIS — R7303 Prediabetes: Secondary | ICD-10-CM | POA: Diagnosis present

## 2016-10-26 LAB — CBC WITH DIFFERENTIAL/PLATELET
BASOS ABS: 0 10*3/uL (ref 0.0–0.1)
BASOS PCT: 0 %
EOS PCT: 0 %
Eosinophils Absolute: 0.1 10*3/uL (ref 0.0–0.7)
HEMATOCRIT: 37.4 % — AB (ref 39.0–52.0)
Hemoglobin: 13 g/dL (ref 13.0–17.0)
LYMPHS PCT: 18 %
Lymphs Abs: 2.9 10*3/uL (ref 0.7–4.0)
MCH: 29.4 pg (ref 26.0–34.0)
MCHC: 34.8 g/dL (ref 30.0–36.0)
MCV: 84.6 fL (ref 78.0–100.0)
Monocytes Absolute: 2 10*3/uL — ABNORMAL HIGH (ref 0.1–1.0)
Monocytes Relative: 12 %
NEUTROS ABS: 11.7 10*3/uL — AB (ref 1.7–7.7)
Neutrophils Relative %: 70 %
PLATELETS: 292 10*3/uL (ref 150–400)
RBC: 4.42 MIL/uL (ref 4.22–5.81)
RDW: 14 % (ref 11.5–15.5)
WBC: 16.6 10*3/uL — AB (ref 4.0–10.5)

## 2016-10-26 LAB — BASIC METABOLIC PANEL
ANION GAP: 9 (ref 5–15)
BUN: 8 mg/dL (ref 6–20)
CALCIUM: 8.2 mg/dL — AB (ref 8.9–10.3)
CO2: 24 mmol/L (ref 22–32)
Chloride: 104 mmol/L (ref 101–111)
Creatinine, Ser: 0.8 mg/dL (ref 0.61–1.24)
GLUCOSE: 130 mg/dL — AB (ref 65–99)
POTASSIUM: 3.3 mmol/L — AB (ref 3.5–5.1)
Sodium: 137 mmol/L (ref 135–145)

## 2016-10-26 LAB — C-REACTIVE PROTEIN: CRP: 17.3 mg/dL — ABNORMAL HIGH (ref <1.0)

## 2016-10-26 LAB — GLUCOSE, CAPILLARY: GLUCOSE-CAPILLARY: 131 mg/dL — AB (ref 65–99)

## 2016-10-26 LAB — SEDIMENTATION RATE: SED RATE: 50 mm/h — AB (ref 0–16)

## 2016-10-26 MED ORDER — PIPERACILLIN-TAZOBACTAM 3.375 G IVPB 30 MIN
3.3750 g | Freq: Once | INTRAVENOUS | Status: AC
  Administered 2016-10-26: 3.375 g via INTRAVENOUS
  Filled 2016-10-26: qty 50

## 2016-10-26 MED ORDER — RISAQUAD PO CAPS
1.0000 | ORAL_CAPSULE | Freq: Three times a day (TID) | ORAL | Status: DC
  Administered 2016-10-26 – 2016-10-27 (×5): 1 via ORAL
  Filled 2016-10-26 (×6): qty 1

## 2016-10-26 MED ORDER — KETOROLAC TROMETHAMINE 30 MG/ML IJ SOLN
30.0000 mg | Freq: Four times a day (QID) | INTRAMUSCULAR | Status: DC | PRN
  Administered 2016-10-26: 30 mg via INTRAVENOUS
  Filled 2016-10-26 (×2): qty 1

## 2016-10-26 MED ORDER — BISACODYL 5 MG PO TBEC: 5.0000 mg | DELAYED_RELEASE_TABLET | Freq: Every day | ORAL | Status: DC | PRN

## 2016-10-26 MED ORDER — VANCOMYCIN HCL IN DEXTROSE 1-5 GM/200ML-% IV SOLN
1000.0000 mg | Freq: Three times a day (TID) | INTRAVENOUS | Status: DC
  Administered 2016-10-26 – 2016-10-27 (×5): 1000 mg via INTRAVENOUS
  Filled 2016-10-26 (×6): qty 200

## 2016-10-26 MED ORDER — HYDROMORPHONE HCL 1 MG/ML IJ SOLN
1.0000 mg | Freq: Once | INTRAMUSCULAR | Status: AC
  Administered 2016-10-26: 1 mg via INTRAVENOUS
  Filled 2016-10-26: qty 1

## 2016-10-26 MED ORDER — ONDANSETRON HCL 4 MG PO TABS: 4.0000 mg | ORAL_TABLET | Freq: Four times a day (QID) | ORAL | Status: DC | PRN

## 2016-10-26 MED ORDER — POTASSIUM CHLORIDE CRYS ER 20 MEQ PO TBCR
20.0000 meq | EXTENDED_RELEASE_TABLET | Freq: Once | ORAL | Status: AC
  Administered 2016-10-26: 20 meq via ORAL
  Filled 2016-10-26: qty 1

## 2016-10-26 MED ORDER — PIPERACILLIN-TAZOBACTAM 3.375 G IVPB
3.3750 g | Freq: Three times a day (TID) | INTRAVENOUS | Status: DC
  Administered 2016-10-26 – 2016-10-27 (×5): 3.375 g via INTRAVENOUS
  Filled 2016-10-26 (×6): qty 50

## 2016-10-26 MED ORDER — HYDROMORPHONE HCL 1 MG/ML IJ SOLN
1.0000 mg | INTRAMUSCULAR | Status: DC | PRN
  Administered 2016-10-26 – 2016-10-27 (×7): 1 mg via INTRAVENOUS
  Filled 2016-10-26 (×7): qty 1

## 2016-10-26 MED ORDER — HYDROCODONE-ACETAMINOPHEN 5-325 MG PO TABS
1.0000 | ORAL_TABLET | ORAL | Status: DC | PRN
  Administered 2016-10-26 – 2016-10-27 (×3): 2 via ORAL
  Filled 2016-10-26 (×5): qty 2

## 2016-10-26 MED ORDER — ONDANSETRON HCL 4 MG/2ML IJ SOLN
4.0000 mg | Freq: Once | INTRAMUSCULAR | Status: AC
  Administered 2016-10-26: 4 mg via INTRAVENOUS
  Filled 2016-10-26: qty 2

## 2016-10-26 MED ORDER — ACETAMINOPHEN 650 MG RE SUPP: 650.0000 mg | Freq: Four times a day (QID) | RECTAL | Status: DC | PRN

## 2016-10-26 MED ORDER — MORPHINE SULFATE (PF) 2 MG/ML IV SOLN: 1.0000 mg | INTRAVENOUS | Status: DC | PRN

## 2016-10-26 MED ORDER — ACETAMINOPHEN 325 MG PO TABS
650.0000 mg | ORAL_TABLET | Freq: Four times a day (QID) | ORAL | Status: DC | PRN
Start: 2016-10-26 — End: 2016-10-27

## 2016-10-26 MED ORDER — ENOXAPARIN SODIUM 40 MG/0.4ML ~~LOC~~ SOLN
40.0000 mg | SUBCUTANEOUS | Status: DC
  Administered 2016-10-26 – 2016-10-27 (×2): 40 mg via SUBCUTANEOUS
  Filled 2016-10-26 (×2): qty 0.4

## 2016-10-26 MED ORDER — VANCOMYCIN HCL IN DEXTROSE 1-5 GM/200ML-% IV SOLN: 1000.0000 mg | Freq: Once | INTRAVENOUS | Status: DC

## 2016-10-26 MED ORDER — POLYETHYLENE GLYCOL 3350 17 G PO PACK: 17.0000 g | PACK | Freq: Every day | ORAL | Status: DC | PRN

## 2016-10-26 MED ORDER — ONDANSETRON HCL 4 MG/2ML IJ SOLN
4.0000 mg | Freq: Four times a day (QID) | INTRAMUSCULAR | Status: DC | PRN
  Administered 2016-10-26 (×2): 4 mg via INTRAVENOUS
  Filled 2016-10-26 (×2): qty 2

## 2016-10-26 MED ORDER — SODIUM CHLORIDE 0.9 % IV SOLN
INTRAVENOUS | Status: AC
  Administered 2016-10-26: 02:00:00 via INTRAVENOUS

## 2016-10-26 NOTE — Progress Notes (Signed)
Jacob Morasdrian L Liberatore Jr. is a 26 y.o. male with medical history significant for nephrolithiasis, carpal tunnel syndrome status post release, and recurrent cellulitis involving the right hand, PRESENTED TO the emergency department with 2 days of fevers, as well as redness, swelling, and pain involving the right hand.    Plan:  IV antibiotics and pain control.  Pt sees Dr Mina MarbleWeingold in the past, if the cellulitis doesn't improve, plan to call the hand surgeon on call.   Jacob ModyVijaya Randi College, MD [phone]

## 2016-10-26 NOTE — Progress Notes (Signed)
Pharmacy Antibiotic Note  Jacob Morasdrian L Ralph Nixon. is a 26 y.o. male admitted on 10/25/2016 with cellulitis.  Pharmacy has been consulted for Vancomycin and Zosyn dosing.  Plan: Vancomycin 1gm IV every 8 hours.  Goal trough 10-15 mcg/mL. Zosyn 3.375g IV q8h (4 hour infusion).  Height: 5\' 11"  (180.3 cm) Weight: 253 lb (114.8 kg) IBW/kg (Calculated) : 75.3  Temp (24hrs), Avg:99.5 F (37.5 C), Min:99.1 F (37.3 C), Max:99.9 F (37.7 C)   Recent Labs Lab 10/25/16 2223 10/25/16 2234  WBC 18.9*  --   CREATININE 0.95  --   LATICACIDVEN  --  1.66    Estimated Creatinine Clearance: 151.8 mL/min (by C-G formula based on SCr of 0.95 mg/dL).    Allergies  Allergen Reactions  . Coconut Flavor     Scratchy throat  . No Known Allergies     Antimicrobials this admission: Vancomycin 10/26/2016 >> Zosyn 10/26/2016 >>   Dose adjustments this admission: -  Microbiology results: pending  Thank you for allowing pharmacy to be a part of this patient's care.  Jacob Nixon, Jacob Nixon 10/26/2016 3:03 AM

## 2016-10-26 NOTE — H&P (Signed)
History and Physical    [name]. ZOX:[id number] DOB: 1991-01-27 DOA: 10/25/2016  PCP: [facility]   Patient coming from: Home  Chief Complaint: Fevers; pain, swelling, redness to right hand and wrist   HPI: Thaddeus Evitts. is a 26 y.o. male with medical history significant for nephrolithiasis, carpal tunnel syndrome status post release, and recurrent cellulitis involving the right hand, now presenting to the emergency department with 2 days of fevers, as well as redness, swelling, and pain involving the right hand. Patient reports that he had been in his usual state of health until approximately week ago when he developed a sore throat. He was evaluated at an urgent care, suspected of having strep throat, and started on amoxicillin. 2 days later, patient developed fevers with pain, swelling, and redness of the right hand. Over the ensuing days, pain, redness, and swelling all increased and area of involvement advanced proximally up to the mid-forearm. Patient reports similar experiences with these symptoms, having been admitted in March of this year for a recurrence. He was treated with broad-spectrum antibiotics empirically during that admission and discharge home with clindamycin. He reports that he made a full recovery from this prior to development of the current symptoms. States that he's been evaluated by infectious disease in the past for this and advised that it was unlikely to be infection. He was then referred to rheumatology and reports undergoing negative testing for gout and RA. He reports that the rheumatologist was considering a synovial biopsy. He notes that he has enjoyed rapid improvement of this condition with antibiotic treatment during each instance in the past. He reports a history of carpal tunnel release on the right wrist several years ago, but reports that his first bout of this current illness occurred prior to that surgery.   ED  Course: Upon arrival to the ED, patient is found to have a temp of 37.7C, saturating well on room air, tachycardic to 121, and with stable blood pressure. Chemistry panels notable for sodium of 133, potassium 3.4, and glucose of 168. CBC is notable for leukocytosis to 18,900 and lactic acid is reassuring at 1.66. Urinalysis features microscopic hematuria and proteinuria. Blood cultures were obtained, 1 L of normal saline was given, and the patient was treated with empiric vancomycin. He was provided Toradol, Dilaudid, morphine, and Zofran in the ED for symptomatic care. Tachycardia has improved with the IV fluids, blood pressure has remained adequate, and there has not been any respiratory distress. Patient will be admitted to the medical surgical unit for ongoing evaluation and management of fevers with marked leukocytosis and apparent cellulitis involving the right upper extremity.  Review of Systems:  All other systems reviewed and apart from HPI, are negative.  Past Medical History:  Diagnosis Date  . Bronchitis   . Cellulitis of right hand - RECURRENT   . Kidney stones   . Kidney stones     Past Surgical History:  Procedure Laterality Date  . CARPAL TUNNEL RELEASE Left 04/09/2016   Procedure: CARPAL TUNNEL RELEASE;  Surgeon: Cammy Copa, MD;  Location: [facility] OR;  Service: Orthopedics;  Laterality: Left;  . HAND SURGERY    . KIDNEY STONE SURGERY     lithortripsy     reports that he has quit smoking. He has never used smokeless tobacco. He reports that he does not drink alcohol or use drugs.  Allergies  Allergen Reactions  . Coconut Flavor     Scratchy  throat  . No Known Allergies     Family History  Problem Relation Age of Onset  . Hypertension Other   . Diabetes Father      Prior to Admission medications   Medication Sig Start Date End Date Taking? Authorizing Provider  acetaminophen (TYLENOL) 650 MG CR tablet Take 1,300 mg by mouth every 8 (eight) hours as needed  for pain.   Yes [provider]  amoxicillin (AMOXIL) 500 MG capsule Take 500 mg by mouth 2 (two) times daily.   Yes [provider]  HYDROcodone-acetaminophen (NORCO/VICODIN) 5-325 MG tablet Take 1-2 tablets by mouth every 4 (four) hours as needed. 09/17/16  Yes Donnetta Hutching, MD    Physical Exam: Vitals:   10/25/16 2330 10/26/16 0003 10/26/16 0039 10/26/16 0111  BP: 116/76 116/74 (!) 95/54 110/70  Pulse: (!) 108 (!) 117 (!) 107 (!) 102  Resp:  18 18 17   Temp:      TempSrc:      SpO2: 99% 100% 97% 94%  Weight:      Height:          Constitutional: NAD, calm, in apparent discomfort Eyes: PERTLA, lids and conjunctivae normal ENMT: Mucous membranes are moist. Posterior pharynx clear of any exudate or lesions.   Neck: normal, supple, no masses, no thyromegaly Respiratory: clear to auscultation bilaterally, no wheezing, no crackles. Normal respiratory effort.   Cardiovascular: Rate ~110 and regular. No extremity edema. No significant JVD. Abdomen: No distension, no tenderness, no masses palpated. Bowel sounds normal.  Musculoskeletal: no clubbing / cyanosis. Edema to right hand and wrist. Normal muscle tone.  Skin: Redness, swelling, and exquisite tenderness involving dorsal right hand and wrist, extending proximally to the ventral mid-forearm. Desquamation at right hand and wrist. No fluctuance or drainage; no local LAD appreciated. Skin is otherwise warm, dry, well-perfused. Neurologic: CN 2-12 grossly intact. Sensation intact, DTR normal. Strength 5/5 in all 4 limbs.  Psychiatric: Alert and oriented x 3. Calm and cooperative.     Labs on Admission: I have personally reviewed following labs and imaging studies  CBC:  Recent Labs Lab 10/25/16 2223  WBC 18.9*  NEUTROABS 14.6*  HGB 14.1  HCT 40.5  MCV 84.6  PLT 307   Basic Metabolic Panel:  Recent Labs Lab 10/25/16 2223  NA 133*  K 3.4*  CL 101  CO2 20*  GLUCOSE 168*  BUN 9  CREATININE 0.95    CALCIUM 8.6*   GFR: Estimated Creatinine Clearance: 151.8 mL/min (by C-G formula based on SCr of 0.95 mg/dL). Liver Function Tests:  Recent Labs Lab 10/25/16 2223  AST 20  ALT 22  ALKPHOS 81  BILITOT 1.3*  PROT 7.8  ALBUMIN 3.7   No results for input(s): LIPASE, AMYLASE in the last 168 hours. No results for input(s): AMMONIA in the last 168 hours. Coagulation Profile: No results for input(s): INR, PROTIME in the last 168 hours. Cardiac Enzymes: No results for input(s): CKTOTAL, CKMB, CKMBINDEX, TROPONINI in the last 168 hours. BNP (last 3 results) No results for input(s): PROBNP in the last 8760 hours. HbA1C: No results for input(s): HGBA1C in the last 72 hours. CBG: No results for input(s): GLUCAP in the last 168 hours. Lipid Profile: No results for input(s): CHOL, HDL, LDLCALC, TRIG, CHOLHDL, LDLDIRECT in the last 72 hours. Thyroid Function Tests: No results for input(s): TSH, T4TOTAL, FREET4, T3FREE, THYROIDAB in the last 72 hours. Anemia Panel: No results for input(s): VITAMINB12, FOLATE, FERRITIN, TIBC, IRON, RETICCTPCT in the last  72 hours. Urine analysis:    Component Value Date/Time   COLORURINE YELLOW 10/25/2016 2317   APPEARANCEUR CLEAR 10/25/2016 2317   LABSPEC 1.024 10/25/2016 2317   PHURINE 5.0 10/25/2016 2317   GLUCOSEU NEGATIVE 10/25/2016 2317   HGBUR SMALL (A) 10/25/2016 2317   BILIRUBINUR NEGATIVE 10/25/2016 2317   KETONESUR 5 (A) 10/25/2016 2317   PROTEINUR 30 (A) 10/25/2016 2317   UROBILINOGEN 1.0 11/28/2014 1026   NITRITE NEGATIVE 10/25/2016 2317   LEUKOCYTESUR NEGATIVE 10/25/2016 2317   Sepsis Labs: @LABRCNTIP (procalcitonin:4,lacticidven:4) )No results found for this or any previous visit (from the past 240 hour(s)).   Radiological Exams on Admission: No results found.  EKG: Not performed.   Assessment/Plan  1. Cellulitis of RUE  - Pt presents with pain, swelling, and erythema involving right hand and wrist; he has been febrile   -  Noted to have tachycardia to 120's and leukocytosis to 18,900 with reassuring lactate  - This has been a recurring issue for him, admitted for the same in March 2018; reports complete resolution after that admission, until sxs returned 10/23/16  - Pt reports that he has seen rheumatology for this after an ID consultant suggested that it wasn't infectious; he reports undergoing negative testing for RA and gout and reports that a synovial biopsy was being considered  - He has had MRI and venous US of the RUE with prior episodes; MRI was consistent with cellulitis without abscess and venous US negative for DVT  - Blood cultures were collected in ED and patient received empiric vancomycin  - There is no appreciable purulence, but desquamation noted; will continue empiric treatment with vancomycin and Zosyn initially given the fever, marked leukocytosis, and tachycardia - RN asked to outline area of involvement on admission, elevate the arm while pt at rest - If fails to improve as expected, may need imaging to exclude underlying abscess   2. Hyponatremia  - Serum sodium 133 in setting of acute infection with fever and hypovolemia  - Given 1 liter NS in ED, a second liter bolus on admission, and continued on NS infusion  - Repeat chem panel in am    3. Hypokalemia  - Serum potassium 3.4  - Treated with 20 mEq oral potassium - Repeat chem panel in am    4. Hyperglycemia  - Serum glucose 168 on admission - A1c 6.0% in Sept 2017 - Given recurrent cellulitis and hyperglycemia, will repeat A1c, hydrate, check CBG in am    DVT prophylaxis: sq Lovenox  Code Status: Full  Family Communication: Significant other updated at bedside Disposition Plan: Admit to med-surg Consults called: None Admission status: Inpatient    Briscoe Deutscherimothy S Opyd, MD [facility] Pager [phone]  If 7PM-7AM, please contact night-coverage [email or web address] Password [email or web address]  10/26/2016, 1:39 AM

## 2016-10-27 ENCOUNTER — Inpatient Hospital Stay (HOSPITAL_COMMUNITY): Payer: BLUE CROSS/BLUE SHIELD

## 2016-10-27 DIAGNOSIS — E876 Hypokalemia: Secondary | ICD-10-CM

## 2016-10-27 DIAGNOSIS — R7309 Other abnormal glucose: Secondary | ICD-10-CM

## 2016-10-27 LAB — CBC WITH DIFFERENTIAL/PLATELET
BASOS ABS: 0 10*3/uL (ref 0.0–0.1)
Basophils Relative: 0 %
EOS ABS: 0.4 10*3/uL (ref 0.0–0.7)
Eosinophils Relative: 3 %
HCT: 37.1 % — ABNORMAL LOW (ref 39.0–52.0)
Hemoglobin: 12.5 g/dL — ABNORMAL LOW (ref 13.0–17.0)
LYMPHS PCT: 20 %
Lymphs Abs: 2.7 10*3/uL (ref 0.7–4.0)
MCH: 29 pg (ref 26.0–34.0)
MCHC: 33.7 g/dL (ref 30.0–36.0)
MCV: 86.1 fL (ref 78.0–100.0)
Monocytes Absolute: 1.2 10*3/uL — ABNORMAL HIGH (ref 0.1–1.0)
Monocytes Relative: 9 %
NEUTROS PCT: 68 %
Neutro Abs: 9.3 10*3/uL — ABNORMAL HIGH (ref 1.7–7.7)
PLATELETS: 301 10*3/uL (ref 150–400)
RBC: 4.31 MIL/uL (ref 4.22–5.81)
RDW: 14.2 % (ref 11.5–15.5)
WBC: 13.6 10*3/uL — ABNORMAL HIGH (ref 4.0–10.5)

## 2016-10-27 LAB — BASIC METABOLIC PANEL
ANION GAP: 7 (ref 5–15)
BUN: 8 mg/dL (ref 6–20)
CALCIUM: 8.3 mg/dL — AB (ref 8.9–10.3)
CHLORIDE: 104 mmol/L (ref 101–111)
CO2: 26 mmol/L (ref 22–32)
Creatinine, Ser: 1.01 mg/dL (ref 0.61–1.24)
GFR calc non Af Amer: >60 mL/min (ref >60)
Glucose, Bld: 109 mg/dL — ABNORMAL HIGH (ref 65–99)
Potassium: 4 mmol/L (ref 3.5–5.1)
Sodium: 137 mmol/L (ref 135–145)

## 2016-10-27 LAB — VANCOMYCIN, TROUGH: Vancomycin Tr: 11 ug/mL — ABNORMAL LOW (ref 15–20)

## 2016-10-27 LAB — HIV ANTIBODY (ROUTINE TESTING W REFLEX): HIV Screen 4th Generation wRfx: NONREACTIVE

## 2016-10-27 LAB — CREATININE, SERUM
Creatinine, Ser: 0.99 mg/dL (ref 0.61–1.24)
GFR calc Af Amer: >60 mL/min (ref >60)

## 2016-10-27 LAB — HEMOGLOBIN A1C
HEMOGLOBIN A1C: 5.7 % — AB (ref 4.8–5.6)
Mean Plasma Glucose: 117 mg/dL

## 2016-10-27 LAB — GLUCOSE, CAPILLARY: GLUCOSE-CAPILLARY: 127 mg/dL — AB (ref 65–99)

## 2016-10-27 NOTE — Progress Notes (Signed)
Discharge instructions provided to pt as well as copies of xrays on CD. Jacob Nixon, [name]

## 2016-10-27 NOTE — Progress Notes (Signed)
Transferred to 1535, no changes in assessment. Jacob Nixon, [name]

## 2016-10-27 NOTE — Progress Notes (Signed)
Report phoned to Jacob DredgeKayla Wainscot, RN, at [facility]. Jacob Nixon, Jacob Nixon

## 2016-10-27 NOTE — Plan of Care (Signed)
Problem: Education: Goal: Knowledge of [facility] Education information/materials will improve Outcome: Adequate for Discharge [facility]

## 2016-10-27 NOTE — Progress Notes (Signed)
Pharmacy Antibiotic Note  Jacob Morasdrian L Grunwald Jr. is a 26 y.o. male admitted on 10/25/2016 with cellulitis involving the right hand.  Pharmacy has been consulted for Vancomycin and Zosyn dosing.  10/27/2016 vanc trough=11, therapeutic (goal 10-15) Scr 1.0, CrCl > 15400mls/min WBC 13.6 Afebrile  Plan: Continue vancomycin 1gm IV q8h Continue Zosyn 3.375g IV Q8H infused over 4hrs. Continue Daily SCr F/u for de-escalation   Height: 5\' 11"  (180.3 cm) Weight: 262 lb 12.6 oz (119.2 kg) IBW/kg (Calculated) : 75.3  Temp (24hrs), Avg:98.2 F (36.8 C), Min:97.6 F (36.4 C), Max:98.6 F (37 C)   Recent Labs Lab 10/25/16 2223 10/25/16 2234 10/26/16 0235 10/27/16 0435 10/27/16 0746 10/27/16 0800 10/27/16 1329  WBC 18.9*  --  16.6*  --   --  13.6*  --   CREATININE 0.95  --  0.80 0.99 1.01  --   --   LATICACIDVEN  --  1.66  --   --   --   --   --   VANCOTROUGH  --   --   --   --   --   --  11*    Estimated Creatinine Clearance: 145.6 mL/min (by C-G formula based on SCr of 1.01 mg/dL).    Allergies  Allergen Reactions  . Coconut Flavor     Scratchy throat  . No Known Allergies     Antimicrobials this admission: Vancomycin 10/27/2016 >> Zosyn 10/27/2016 >>   Dose adjustments this admission: 6/10 VT=11 on 1gm IV q8h (before the 6th dose)  Microbiology results: 6/8 BCx: ngtd  Thank you for allowing pharmacy to be a part of this patient's care.  Arley PhenixEllen Evangelina Delancey RPh 10/27/2016, 2:25 PM Pager [phone]

## 2016-10-27 NOTE — Plan of Care (Signed)
Problem: Education: Goal: Knowledge of [facility] Education information/materials will improve Outcome: Adequate for Discharge Transferred to [facility]  Problem: Health Behavior/Discharge Planning: Goal: Ability to manage health-related needs will improve Outcome: Adequate for Discharge Transferred to [facility]  Problem: Pain Managment: Goal: General experience of comfort will improve Outcome: Adequate for Discharge Transferred to [facility]  Problem: Physical Regulation: Goal: Ability to maintain clinical measurements within normal limits will improve Outcome: Adequate for Discharge Transferred to [facility] Goal: Will remain free from infection Outcome: Not Applicable Date Met: 11/17/14 Transfer[facility] to [facility]  Problem: Skin Integrity: Goal: Risk for impaired skin integrity will decrease Outcome: Adequate for Discharge Transferred to [facility]

## 2016-10-27 NOTE — Progress Notes (Signed)
Discharged from floor via stretcher [facility] [employer]. Belongings & EMT with pt. No changes in assessment. Avryl Roehm, [name]

## 2016-10-27 NOTE — Consult Note (Signed)
Reason for Consult: Right hand and wrist swelling and discomfort Referring Physician: Alonza Bogus. is an 26 y.o. male.  HPI: Patient is very pleasant 26 year old right-hand-dominant male who presented to the emergency department yesterday with pain and swelling over the dorsal ulnar aspect of his left hand and wrist with an elevated white count of 19,000. Patient was admitted to the hospitalist service for cellulitis and started on intravenous antibiotics. Patient states she's had several episodes in the past of this same predicament which is resolved with antibiotics and steroids. Patient has also had a infectious disease consult and rheumatologic consult which were negative for any specific entities.  Past Medical History:  Diagnosis Date  . Bronchitis   . Cellulitis of right hand - RECURRENT   . Kidney stones   . Kidney stones     Past Surgical History:  Procedure Laterality Date  . CARPAL TUNNEL RELEASE Left 04/09/2016   Procedure: CARPAL TUNNEL RELEASE;  Surgeon: Meredith Pel, MD;  Location: [facility];  Service: Orthopedics;  Laterality: Left;  . HAND SURGERY    . KIDNEY STONE SURGERY     lithortripsy    Family History  Problem Relation Age of Onset  . Hypertension Other   . Diabetes Father     Social History:  reports that he has quit smoking. He has never used smokeless tobacco. He reports that he does not drink alcohol or use drugs.  Allergies:  Allergies  Allergen Reactions  . Coconut Flavor     Scratchy throat  . No Known Allergies     Medications:  Prior to Admission:  Prescriptions Prior to Admission  Medication Sig Dispense Refill Last Dose  . acetaminophen (TYLENOL) 650 MG CR tablet Take 1,300 mg by mouth every 8 (eight) hours as needed for pain.   10/25/2016 at Unknown time  . amoxicillin (AMOXIL) 500 MG capsule Take 500 mg by mouth 2 (two) times daily.   10/25/2016 at Unknown time  . HYDROcodone-acetaminophen (NORCO/VICODIN) 5-325 MG tablet Take  1-2 tablets by mouth every 4 (four) hours as needed. 10 tablet 0 Past Week at Unknown time    Results for orders placed or performed during the hospital encounter of 10/25/16 (from the past 48 hour(s))  Comprehensive metabolic panel     Status: Abnormal   Collection Time: 10/25/16 10:23 PM  Result Value Ref Range   Sodium 133 (L) 135 - 145 mmol/L   Potassium 3.4 (L) 3.5 - 5.1 mmol/L   Chloride 101 101 - 111 mmol/L   CO2 20 (L) 22 - 32 mmol/L   Glucose, Bld 168 (H) 65 - 99 mg/dL   BUN 9 6 - 20 mg/dL   Creatinine, Ser 0.95 0.61 - 1.24 mg/dL   Calcium 8.6 (L) 8.9 - 10.3 mg/dL   Total Protein 7.8 6.5 - 8.1 g/dL   Albumin 3.7 3.5 - 5.0 g/dL   AST 20 15 - 41 U/L   ALT 22 17 - 63 U/L   Alkaline Phosphatase 81 38 - 126 U/L   Total Bilirubin 1.3 (H) 0.3 - 1.2 mg/dL   GFR calc non Af Amer >60 >60 mL/min   GFR calc Af Amer >60 >60 mL/min    Comment: (NOTE) The eGFR has been calculated using the CKD EPI equation. This calculation has not been validated in all clinical situations. eGFR's persistently <60 mL/min signify possible Chronic Kidney Disease.    Anion gap 12 5 - 15  CBC with Differential  Status: Abnormal   Collection Time: 10/25/16 10:23 PM  Result Value Ref Range   WBC 18.9 (H) 4.0 - 10.5 K/uL   RBC 4.79 4.22 - 5.81 MIL/uL   Hemoglobin 14.1 13.0 - 17.0 g/dL   HCT 40.5 39.0 - 52.0 %   MCV 84.6 78.0 - 100.0 fL   MCH 29.4 26.0 - 34.0 pg   MCHC 34.8 30.0 - 36.0 g/dL   RDW 13.9 11.5 - 15.5 %   Platelets 307 150 - 400 K/uL   Neutrophils Relative % 78 %   Neutro Abs 14.6 (H) 1.7 - 7.7 K/uL   Lymphocytes Relative 15 %   Lymphs Abs 2.9 0.7 - 4.0 K/uL   Monocytes Relative 7 %   Monocytes Absolute 1.3 (H) 0.1 - 1.0 K/uL   Eosinophils Relative 0 %   Eosinophils Absolute 0.0 0.0 - 0.7 K/uL   Basophils Relative 0 %   Basophils Absolute 0.0 0.0 - 0.1 K/uL  I-Stat CG4 Lactic Acid, ED     Status: None   Collection Time: 10/25/16 10:34 PM  Result Value Ref Range   Lactic Acid,  Venous 1.66 0.5 - 1.9 mmol/L  Urinalysis, Routine w reflex microscopic     Status: Abnormal   Collection Time: 10/25/16 11:17 PM  Result Value Ref Range   Color, Urine YELLOW YELLOW   APPearance CLEAR CLEAR   Specific Gravity, Urine 1.024 1.005 - 1.030   pH 5.0 5.0 - 8.0   Glucose, UA NEGATIVE NEGATIVE mg/dL   Hgb urine dipstick SMALL (A) NEGATIVE   Bilirubin Urine NEGATIVE NEGATIVE   Ketones, ur 5 (A) NEGATIVE mg/dL   Protein, ur 30 (A) NEGATIVE mg/dL   Nitrite NEGATIVE NEGATIVE   Leukocytes, UA NEGATIVE NEGATIVE   RBC / HPF 6-30 0 - 5 RBC/hpf   WBC, UA 0-5 0 - 5 WBC/hpf   Bacteria, UA NONE SEEN NONE SEEN   Squamous Epithelial / LPF NONE SEEN NONE SEEN   Mucous PRESENT   CBC WITH DIFFERENTIAL     Status: Abnormal   Collection Time: 10/26/16  2:35 AM  Result Value Ref Range   WBC 16.6 (H) 4.0 - 10.5 K/uL   RBC 4.42 4.22 - 5.81 MIL/uL   Hemoglobin 13.0 13.0 - 17.0 g/dL   HCT 37.4 (L) 39.0 - 52.0 %   MCV 84.6 78.0 - 100.0 fL   MCH 29.4 26.0 - 34.0 pg   MCHC 34.8 30.0 - 36.0 g/dL   RDW 14.0 11.5 - 15.5 %   Platelets 292 150 - 400 K/uL   Neutrophils Relative % 70 %   Neutro Abs 11.7 (H) 1.7 - 7.7 K/uL   Lymphocytes Relative 18 %   Lymphs Abs 2.9 0.7 - 4.0 K/uL   Monocytes Relative 12 %   Monocytes Absolute 2.0 (H) 0.1 - 1.0 K/uL   Eosinophils Relative 0 %   Eosinophils Absolute 0.1 0.0 - 0.7 K/uL   Basophils Relative 0 %   Basophils Absolute 0.0 0.0 - 0.1 K/uL  Basic metabolic panel     Status: Abnormal   Collection Time: 10/26/16  2:35 AM  Result Value Ref Range   Sodium 137 135 - 145 mmol/L   Potassium 3.3 (L) 3.5 - 5.1 mmol/L   Chloride 104 101 - 111 mmol/L   CO2 24 22 - 32 mmol/L   Glucose, Bld 130 (H) 65 - 99 mg/dL   BUN 8 6 - 20 mg/dL   Creatinine, Ser 0.80 0.61 - 1.24 mg/dL  Calcium 8.2 (L) 8.9 - 10.3 mg/dL   GFR calc non Af Amer >60 >60 mL/min   GFR calc Af Amer >60 >60 mL/min    Comment: (NOTE) The eGFR has been calculated using the CKD EPI  equation. This calculation has not been validated in all clinical situations. eGFR's persistently <60 mL/min signify possible Chronic Kidney Disease.    Anion gap 9 5 - 15  C-reactive protein     Status: Abnormal   Collection Time: 10/26/16  2:35 AM  Result Value Ref Range   CRP 17.3 (H) <1.0 mg/dL    Comment: Performed at [facility] [address], [place], [place] [address]  Sedimentation rate     Status: Abnormal   Collection Time: 10/26/16  2:35 AM  Result Value Ref Range   Sed Rate 50 (H) 0 - 16 mm/hr  Hemoglobin A1c     Status: Abnormal   Collection Time: 10/26/16  2:35 AM  Result Value Ref Range   Hgb A1c MFr Bld 5.7 (H) 4.8 - 5.6 %    Comment: (NOTE)         Pre-diabetes: 5.7 - 6.4         Diabetes: >6.4         Glycemic control for adults with diabetes: <7.0    Mean Plasma Glucose 117 mg/dL    Comment: (NOTE) Performed At: [facility] [address] [place], [place] [id number] Lindon Romp MD SK:[id number]   Glucose, capillary     Status: Abnormal   Collection Time: 10/26/16  7:14 AM  Result Value Ref Range   Glucose-Capillary 131 (H) 65 - 99 mg/dL  Creatinine, serum     Status: None   Collection Time: 10/27/16  4:35 AM  Result Value Ref Range   Creatinine, Ser 0.99 0.61 - 1.24 mg/dL   GFR calc non Af Amer >60 >60 mL/min   GFR calc Af Amer >60 >60 mL/min    Comment: (NOTE) The eGFR has been calculated using the CKD EPI equation. This calculation has not been validated in all clinical situations. eGFR's persistently <60 mL/min signify possible Chronic Kidney Disease.   Basic metabolic panel     Status: Abnormal   Collection Time: 10/27/16  7:46 AM  Result Value Ref Range   Sodium 137 135 - 145 mmol/L   Potassium 4.0 3.5 - 5.1 mmol/L    Comment: DELTA CHECK NOTED   Chloride 104 101 - 111 mmol/L   CO2 26 22 - 32 mmol/L   Glucose, Bld 109 (H) 65 - 99 mg/dL   BUN 8 6 - 20 mg/dL   Creatinine, Ser 1.01 0.61 - 1.24 mg/dL   Calcium 8.3  (L) 8.9 - 10.3 mg/dL   GFR calc non Af Amer >60 >60 mL/min   GFR calc Af Amer >60 >60 mL/min    Comment: (NOTE) The eGFR has been calculated using the CKD EPI equation. This calculation has not been validated in all clinical situations. eGFR's persistently <60 mL/min signify possible Chronic Kidney Disease.    Anion gap 7 5 - 15  Glucose, capillary     Status: Abnormal   Collection Time: 10/27/16  9:21 AM  Result Value Ref Range   Glucose-Capillary 127 (H) 65 - 99 mg/dL    Dg Wrist Complete Right  Result Date: 10/27/2016 CLINICAL DATA:  Painful swelling of joint. Right wrist pain, redness and swelling for days, progressive. EXAM: RIGHT WRIST - COMPLETE 3+ VIEW COMPARISON:  None. FINDINGS:  There is no evidence of fracture or dislocation. There is no evidence of arthropathy or other focal bone abnormality. No periosteal reaction or bony destructive change. Diffuse soft tissue edema. No soft tissue air or radiopaque foreign body. IMPRESSION: Diffuse soft tissue edema. No acute osseous abnormality, soft tissue air or radiopaque foreign body. Electronically Signed   By: Jeb Levering M.D.   On: 10/27/2016 06:55    Review of Systems  All other systems reviewed and are negative.  Blood pressure 109/60, pulse 83, temperature 98.6 F (37 C), temperature source Oral, resp. rate 15, height '5\' 11"'  (1.803 m), weight 119.2 kg (262 lb 12.6 oz), SpO2 94 %. Physical Exam  Constitutional: He is oriented to person, place, and time. He appears well-developed and well-nourished.  HENT:  Head: Normocephalic and atraumatic.  Neck: Normal range of motion.  Cardiovascular: Normal rate.   Respiratory: Effort normal.  Musculoskeletal:       Right wrist: He exhibits tenderness and swelling.  Mild right dorsal ulnar hand and wrist swelling. Patient can make a composite fist. No evidence of a discernible palpable abscess.  Neurological: He is alert and oriented to person, place, and time.  Skin: Skin is  warm. There is erythema.  Psychiatric: He has a normal mood and affect. His behavior is normal. Judgment and thought content normal.    Assessment/Plan: Patient is a 26 year old male with a persistent intermittent right hand and wrist dorsal ulnar swelling with elevated white count. At this point time patient appears to be responding to intravenous antibiotics with no discernible abscess noted. We will recommend at this point time an MRI of his wrist and hand on the right to rule out deep abscess. He'll continue on his intravenous antibiotics and will be made nothing by mouth after midnight in anticipation of possible incision and drainage tomorrow.  Sheral Apley [facility] 10/27/2016, 12:54 PM

## 2016-10-27 NOTE — Discharge Summary (Signed)
Physician Discharge Summary  Jacob Nixon. JQB:[id number] DOB: 12/16/1990 DOA: 10/25/2016  [facility]P: [facility] [place] date: 10/25/2016 Discharge date: 10/27/2016  Admitted From: Home Disposition:  [facility].   Recommendations for Outpatient Follow-up:  1. Follow up with PCP in 1-2 weeks 2. Please follow up with Hand surgery for right wrist joint biopsy.  3. Please get MRI of the right hand and right wrist for further evaluation.    Discharge Condition:guarded.  CODE STATUS:full code.  Diet recommendation: Heart Healthy    Brief/Interim Summary: Jacob Nixonis a 26 y.o.malewith medical history significant fornephrolithiasis, carpal tunnel syndrome status post release, and recurrent cellulitis involving the right hand,  Presented to the emergency department with 2 days of fevers, as well as redness, swelling, and pain involving the right hand. He was admitted for IV antibiotics for  cellulitis of the right hand. Pt reports that this is his 6 th admission to the hospital in the last one and half year and no body was able to find out the reason behind his recurrent cellulitis of the right hand.  Hand Surgery was called and deemed that he does not have abscess, but would like to get a biopsy of the joint in view of the recurrent cellulitis. Meanwhile patient became very upset that this has been going on for too long and wanted to be transferred to [facility],. Called [facility] transfer line and Dr Marylee Floras accepted the patient for transfer.   Discharge Diagnoses:  Principal Problem:   Cellulitis of right hand Active Problems:   Hyponatremia   Hypokalemia   Elevated glucose level  Swelling erythema and tenderness of the right hand and right wrist: possibly cellulitis.  Differential include tenosynovitis vs cellulitis vs abscses.  Work up in the past for RA, HIV have been negative. His crp is 17 and his ESR is 50. He was febrile on  admission, but no fevers since admission. His leukocytosis was 18,000 on admission and has improved to 13,600.  He has been on IV vancomycin and IV zosyn since admission. Hand surgeon Dr Burney Gauze has been consulted to see if he needs wash out of the wrist joint, ? Biopsy. Marland Kitchen  MRI of the right hand and wrist ordered for evaluation of the wrist.  But patient became upset and wanted to be transferred to [facility] hospital called and he was accepted by Dr Marylee Floras.  Recommend to continue with IV antibiotics as he has been responding well, and possible hand surgery consult and MRi of the right hand.    Hypokalemia: replaced.    Hyperglycemia: he is borderline DM. HIS HGBA1C IS 5.7. Recommend life style modification and weight loss.    Discharge Instructions  Discharge Instructions    Diet - low sodium heart healthy    Complete by:  As directed    Discharge instructions    Complete by:  As directed    Please follow up with Hand surgeon as recommended.  You will need a biopsy of the right wrist joint.     Allergies as of 10/27/2016      Reactions   Coconut Flavor    Scratchy throat   No Known Allergies       Medication List    STOP taking these medications   amoxicillin 500 MG capsule Commonly known as:  AMOXIL   HYDROcodone-acetaminophen 5-325 MG tablet Commonly known as:  NORCO/VICODIN   ondansetron 4 MG disintegrating tablet  Commonly known as:  ZOFRAN ODT   ondansetron 8 MG tablet Commonly known as:  ZOFRAN   traMADol 50 MG tablet Commonly known as:  ULTRAM     TAKE these medications   acetaminophen 650 MG CR tablet Commonly known as:  TYLENOL Take 1,300 mg by mouth every 8 (eight) hours as needed for pain.      Follow-up Information    [facility] [place]. Schedule an appointment as soon as possible for a visit in 1 week(s).   Specialty:  Family Medicine         Allergies  Allergen Reactions  . Coconut Flavor      Scratchy throat  . No Known Allergies     Consultations:  Hand Surgery Dr Burney Gauze.    Procedures/Studies: Dg Wrist Complete Right  Result Date: 10/27/2016 CLINICAL DATA:  Painful swelling of joint. Right wrist pain, redness and swelling for days, progressive. EXAM: RIGHT WRIST - COMPLETE 3+ VIEW COMPARISON:  None. FINDINGS: There is no evidence of fracture or dislocation. There is no evidence of arthropathy or other focal bone abnormality. No periosteal reaction or bony destructive change. Diffuse soft tissue edema. No soft tissue air or radiopaque foreign body. IMPRESSION: Diffuse soft tissue edema. No acute osseous abnormality, soft tissue air or radiopaque foreign body. Electronically Signed   By: Jeb Levering M.D.   On: 10/27/2016 06:55       Subjective: Pt requesting transfer to [facility].   Discharge Exam: Vitals:   10/27/16 1410 10/27/16 1635  BP: 107/62 (!) 144/86  Pulse: 80 87  Resp: 16 16  Temp: 97.6 F (36.4 C) 98.5 F (36.9 C)   Vitals:   10/26/16 2048 10/27/16 0534 10/27/16 1410 10/27/16 1635  BP: (!) 106/51 109/60 107/62 (!) 144/86  Pulse: 95 83 80 87  Resp: _0 Temp: 98.4 F (36.9 C) 98.6 F (37 C) 97.6 F (36.4 C) 98.5 F (36.9 C)  TempSrc: Oral Oral Oral Oral  SpO2: 100% 94% 95% 100%  Weight:      Height:        General: Pt is alert, awake, not in acute distress Cardiovascular: RRR, S1/S2 +, no rubs, no gallops Respiratory: CTA bilaterally, no wheezing, no rhonchi Abdominal: Soft, NT, ND, bowel sounds + Extremities: right hand and wrist swelling with some erythema and tenderness.     The results of significant diagnostics from this hospitalization (including imaging, microbiology, ancillary and laboratory) are listed below for reference.     Microbiology: Recent Results (from the past 240 hour(s))  Blood culture (routine x 2)     Status: None (Preliminary result)   Collection Time: 10/25/16 11:00 PM  Result Value Ref Range  Status   Specimen Description BLOOD LEFT HAND  Final   Special Requests   Final    BOTTLES DRAWN AEROBIC AND ANAEROBIC Blood Culture adequate volume   Culture   Final    NO GROWTH 1 DAY Performed at [facility], [address] [place], [place] [address]    Report Status PENDING  Incomplete  Blood culture (routine x 2)     Status: None (Preliminary result)   Collection Time: 10/25/16 11:17 PM  Result Value Ref Range Status   Specimen Description BLOOD RIGHT ARM  Final   Special Requests   Final    BOTTLES DRAWN AEROBIC AND ANAEROBIC Blood Culture adequate volume   Culture   Final    NO GROWTH 1 DAY  Performed at [facility], [place] [address] [place], [place] [address]    Report Status PENDING  Incomplete     Labs: BNP (last 3 results) No results for input(s): BNP in the last 8760 hours. Basic Metabolic Panel:  Recent Labs Lab 10/25/16 2223 10/26/16 0235 10/27/16 0435 10/27/16 0746  NA 133* 137  --  137  K 3.4* 3.3*  --  4.0  CL 101 104  --  104  CO2 20* 24  --  26  GLUCOSE 168* 130*  --  109*  BUN 9 8  --  8  CREATININE 0.95 0.80 0.99 1.01  CALCIUM 8.6* 8.2*  --  8.3*   Liver Function Tests:  Recent Labs Lab 10/25/16 2223  AST 20  ALT 22  ALKPHOS 81  BILITOT 1.3*  PROT 7.8  ALBUMIN 3.7   No results for input(s): LIPASE, AMYLASE in the last 168 hours. No results for input(s): AMMONIA in the last 168 hours. CBC:  Recent Labs Lab 10/25/16 2223 10/26/16 0235 10/27/16 0800  WBC 18.9* 16.6* 13.6*  NEUTROABS 14.6* 11.7* 9.3*  HGB 14.1 13.0 12.5*  HCT 40.5 37.4* 37.1*  MCV 84.6 84.6 86.1  PLT 307 292 301   Cardiac Enzymes: No results for input(s): CKTOTAL, CKMB, CKMBINDEX, TROPONINI in the last 168 hours. BNP: Invalid input(s): POCBNP CBG:  Recent Labs Lab 10/26/16 0714 10/27/16 0921  GLUCAP 131* 127*   D-Dimer No results for input(s): DDIMER in the last 72 hours. Hgb A1c  Recent Labs  10/26/16 0235  HGBA1C 5.7*   Lipid  Profile No results for input(s): CHOL, HDL, LDLCALC, TRIG, CHOLHDL, LDLDIRECT in the last 72 hours. Thyroid function studies No results for input(s): TSH, T4TOTAL, T3FREE, THYROIDAB in the last 72 hours.  Invalid input(s): FREET3 Anemia work up No results for input(s): VITAMINB12, FOLATE, FERRITIN, TIBC, IRON, RETICCTPCT in the last 72 hours. Urinalysis    Component Value Date/Time   COLORURINE YELLOW 10/25/2016 2317   APPEARANCEUR CLEAR 10/25/2016 2317   LABSPEC 1.024 10/25/2016 2317   PHURINE 5.0 10/25/2016 2317   GLUCOSEU NEGATIVE 10/25/2016 2317   HGBUR SMALL (A) 10/25/2016 2317   BILIRUBINUR NEGATIVE 10/25/2016 2317   KETONESUR 5 (A) 10/25/2016 2317   PROTEINUR 30 (A) 10/25/2016 2317   UROBILINOGEN 1.0 11/28/2014 1026   NITRITE NEGATIVE 10/25/2016 2317   LEUKOCYTESUR NEGATIVE 10/25/2016 2317   Sepsis Labs Invalid input(s): PROCALCITONIN,  WBC,  LACTICIDVEN Microbiology Recent Results (from the past 240 hour(s))  Blood culture (routine x 2)     Status: None (Preliminary result)   Collection Time: 10/25/16 11:00 PM  Result Value Ref Range Status   Specimen Description BLOOD LEFT HAND  Final   Special Requests   Final    BOTTLES DRAWN AEROBIC AND ANAEROBIC Blood Culture adequate volume   Culture   Final    NO GROWTH 1 DAY Performed at [facility], [place] [address], [place], [place] [address]    Report Status PENDING  Incomplete  Blood culture (routine x 2)     Status: None (Preliminary result)   Collection Time: 10/25/16 11:17 PM  Result Value Ref Range Status   Specimen Description BLOOD RIGHT ARM  Final   Special Requests   Final    BOTTLES DRAWN AEROBIC AND ANAEROBIC Blood Culture adequate volume   Culture   Final    NO GROWTH 1 DAY Performed at [facility], [place] [address] [place], [place] [address]  Report Status PENDING  Incomplete     Time coordinating discharge: Over 30 minutes  SIGNED:   Hosie Poisson, MD  [facility] 10/27/2016, 5:20 PM Pager   If 7PM-7AM, please contact night-coverage [email or web address] Password [email or web address]

## 2016-10-27 NOTE — Progress Notes (Addendum)
PROGRESS NOTE    Jacob Nixon.  OFB:[id number] DOB: 01-09-1991 DOA: 10/25/2016 PCP: [facility]    Brief Narrative: Jacob Nixonis a 26 y.o.malewith medical history significant fornephrolithiasis, carpal tunnel syndrome status post release, and recurrent cellulitis involving the right hand,  Presented to  the emergency department with 2 days of fevers, as well as redness, swelling, and pain involving the right hand. He was admitted for IV antibiotics for  cellulitis of the right hand.   Assessment & Plan:   Principal Problem:   Cellulitis of right hand Active Problems:   Hyponatremia   Hypokalemia   Elevated glucose level   Swelling erythema and tenderness of the right hand and right wrist: possibly cellulitis.  Differential include tenosynovitis vs cellulitis vs abscses.  Work up in the past for RA, HIV have been negative. His crp is 17 and his ESR is 50. He was febrile on admission, but no fevers since admission. His leukocytosis was 18,000 and has improved to 13,600.  Hand surgeon Dr Burney Gauze has been consulted to see if he needs wash out of the wrist joint, ? Biopsy.  Will need to be transferred to [facility] to [facility] service for possible I&D in am at [facility].  MRI of the right hand and wrist ordered for evaluation of the wrist.  Appreciate Dr Bertis Ruddy recommendations.    Hypokalemia: replaced.    Hyperglycemia: he is borderline DM. HIS HGBA1C IS 5.7. Recommend life style modification and weight loss.       DVT prophylaxis: lovenox. Code Status: full code.  Family Communication: none at bedside.  Disposition Plan: transfer to [facility]  Consultants:   Hand Surgery Dr Burney Gauze.    Procedures: none.    Antimicrobials: IV vancomycin and IV zosyn since admission. 10/26/2016   Subjective: Patient very upset about the fact that he has been admitted multiple times to the hospital and nobody could figure out what was going on.  He reports he  had been to a rheumatologist and an Copy , and that his work up for RA, TB, and other auto immune disease has been negative.   Objective: Vitals:   10/26/16 0500 10/26/16 1409 10/26/16 2048 10/27/16 0534  BP: (!) 109/51 128/76 (!) 106/51 109/60  Pulse: 87 88 95 83  Resp: '16 16 16 15  ' Temp: 97.7 F (36.5 C) 97.3 F (36.3 C) 98.4 F (36.9 C) 98.6 F (37 C)  TempSrc: Oral Oral Oral Oral  SpO2: 99%  100% 94%  Weight: 119.2 kg (262 lb 12.6 oz)     Height:        Intake/Output Summary (Last 24 hours) at 10/27/16 1159 Last data filed at 10/27/16 0900  Gross per 24 hour  Intake             1300 ml  Output             3275 ml  Net            -1975 ml   Filed Weights   10/25/16 2211 10/26/16 0500  Weight: 114.8 kg (253 lb) 119.2 kg (262 lb 12.6 oz)    Examination:  General exam: pt frustrated and upset , but does not appear to be in distress.  Respiratory system:  Cardiovascular system: S1 & S2 heard, RRR. No JVD, murmurs, rubs, gallops or clicks. No pedal edema. Gastrointestinal system: Abdomen is nondistended, soft and nontender. No organomegaly or masses felt. Normal bowel sounds heard. Central  nervous system: Alert and oriented. No focal neurological deficits. Extremities: right hand swollen , tender. Swelling and erythema has improved but not back to baseline.  Psychiatry: very angry .     Data Reviewed: I have personally reviewed following labs and imaging studies  CBC:  Recent Labs Lab 10/25/16 2223 10/26/16 0235  WBC 18.9* 16.6*  NEUTROABS 14.6* 11.7*  HGB 14.1 13.0  HCT 40.5 37.4*  MCV 84.6 84.6  PLT 307 709   Basic Metabolic Panel:  Recent Labs Lab 10/25/16 2223 10/26/16 0235 10/27/16 0435 10/27/16 0746  NA 133* 137  --  137  K 3.4* 3.3*  --  4.0  CL 101 104  --  104  CO2 20* 24  --  26  GLUCOSE 168* 130*  --  109*  BUN 9 8  --  8  CREATININE 0.95 0.80 0.99 1.01  CALCIUM 8.6* 8.2*  --  8.3*   GFR: Estimated Creatinine Clearance:  145.6 mL/min (by C-G formula based on SCr of 1.01 mg/dL). Liver Function Tests:  Recent Labs Lab 10/25/16 2223  AST 20  ALT 22  ALKPHOS 81  BILITOT 1.3*  PROT 7.8  ALBUMIN 3.7   No results for input(s): LIPASE, AMYLASE in the last 168 hours. No results for input(s): AMMONIA in the last 168 hours. Coagulation Profile: No results for input(s): INR, PROTIME in the last 168 hours. Cardiac Enzymes: No results for input(s): CKTOTAL, CKMB, CKMBINDEX, TROPONINI in the last 168 hours. BNP (last 3 results) No results for input(s): PROBNP in the last 8760 hours. HbA1C:  Recent Labs  10/26/16 0235  HGBA1C 5.7*   CBG:  Recent Labs Lab 10/26/16 0714 10/27/16 0921  GLUCAP 131* 127*   Lipid Profile: No results for input(s): CHOL, HDL, LDLCALC, TRIG, CHOLHDL, LDLDIRECT in the last 72 hours. Thyroid Function Tests: No results for input(s): TSH, T4TOTAL, FREET4, T3FREE, THYROIDAB in the last 72 hours. Anemia Panel: No results for input(s): VITAMINB12, FOLATE, FERRITIN, TIBC, IRON, RETICCTPCT in the last 72 hours. Sepsis Labs:  Recent Labs Lab 10/25/16 2234  LATICACIDVEN 1.66    No results found for this or any previous visit (from the past 240 hour(s)).       Radiology Studies: Dg Wrist Complete Right  Result Date: 10/27/2016 CLINICAL DATA:  Painful swelling of joint. Right wrist pain, redness and swelling for days, progressive. EXAM: RIGHT WRIST - COMPLETE 3+ VIEW COMPARISON:  None. FINDINGS: There is no evidence of fracture or dislocation. There is no evidence of arthropathy or other focal bone abnormality. No periosteal reaction or bony destructive change. Diffuse soft tissue edema. No soft tissue air or radiopaque foreign body. IMPRESSION: Diffuse soft tissue edema. No acute osseous abnormality, soft tissue air or radiopaque foreign body. Electronically Signed   By: Jeb Levering M.D.   On: 10/27/2016 06:55        Scheduled Meds: . acidophilus  1 capsule Oral  TID  . enoxaparin (LOVENOX) injection  40 mg Subcutaneous Q24H   Continuous Infusions: . piperacillin-tazobactam (ZOSYN)  IV 3.375 g (10/27/16 0517)  . vancomycin Stopped (10/27/16 0617)     LOS: 1 day    Time spent: 110 minutes, co ordinating the care, talking to multiple family members and arranging the transfer to [facility], [facility] Pager [phone] If 7PM-7AM, please contact night-coverage [email or web address] Password [email or web address] 10/27/2016, 11:59 AM

## 2016-10-28 SURGERY — IRRIGATION AND DEBRIDEMENT EXTREMITY
Anesthesia: General | Laterality: Right

## 2016-10-31 LAB — CULTURE, BLOOD (ROUTINE X 2)
Culture: NO GROWTH
Culture: NO GROWTH
Special Requests: ADEQUATE
Special Requests: ADEQUATE

## 2016-11-20 ENCOUNTER — Emergency Department (HOSPITAL_COMMUNITY)
Admission: EM | Admit: 2016-11-20 | Discharge: 2016-11-20 | Disposition: A | Discharge status: Home / Self Care | Payer: Self-pay | Attending: Emergency Medicine | Admitting: Emergency Medicine

## 2016-11-20 ENCOUNTER — Encounter (HOSPITAL_COMMUNITY): Payer: Self-pay | Admitting: Emergency Medicine

## 2016-11-20 DIAGNOSIS — R519 Headache, unspecified: Secondary | ICD-10-CM

## 2016-11-20 DIAGNOSIS — R51 Headache: Secondary | ICD-10-CM

## 2016-11-20 DIAGNOSIS — J019 Acute sinusitis, unspecified: Secondary | ICD-10-CM

## 2016-11-20 DIAGNOSIS — R110 Nausea: Secondary | ICD-10-CM

## 2016-11-20 DIAGNOSIS — Z87891 Personal history of nicotine dependence: Secondary | ICD-10-CM | POA: Insufficient documentation

## 2016-11-20 DIAGNOSIS — R42 Dizziness and giddiness: Secondary | ICD-10-CM

## 2016-11-20 MED ORDER — DEXAMETHASONE SODIUM PHOSPHATE 10 MG/ML IJ SOLN
10.0000 mg | Freq: Once | INTRAMUSCULAR | Status: AC
  Administered 2016-11-20: 10 mg via INTRAMUSCULAR
  Filled 2016-11-20: qty 1

## 2016-11-20 MED ORDER — METOCLOPRAMIDE HCL 10 MG PO TABS: 10.0000 mg | ORAL_TABLET | Freq: Four times a day (QID) | ORAL | 0 refills | Status: DC | PRN

## 2016-11-20 MED ORDER — METOCLOPRAMIDE HCL 5 MG/ML IJ SOLN
10.0000 mg | Freq: Once | INTRAMUSCULAR | Status: AC
  Administered 2016-11-20: 10 mg via INTRAMUSCULAR
  Filled 2016-11-20: qty 2

## 2016-11-20 MED ORDER — FLUTICASONE PROPIONATE 50 MCG/ACT NA SUSP: 2.0000 | Freq: Every day | NASAL | 0 refills | Status: DC

## 2016-11-20 MED ORDER — KETOROLAC TROMETHAMINE 30 MG/ML IJ SOLN
30.0000 mg | Freq: Once | INTRAMUSCULAR | Status: AC
  Administered 2016-11-20: 30 mg via INTRAMUSCULAR
  Filled 2016-11-20: qty 1

## 2016-11-20 NOTE — ED Provider Notes (Signed)
[facility]-EMERGENCY DEPT Provider Note   CSN: [id number] Arrival date & time: 11/20/16  7829  By signing my name below, I, Freida Busman, attest that this documentation has been prepared under the direction and in the presence of [address] [employer]. Electronically Signed: Freida Busman, Scribe. 11/20/2016. 10:39 AM.   History   Chief Complaint Chief Complaint  Patient presents with  . Headache   The history is provided by the patient and medical records. No language interpreter was used.  Headache   This is a new problem. The current episode started 1 to 2 hours ago. The problem occurs constantly. The problem has been gradually improving. The headache is associated with nothing. Pain location: generalized. The quality of the pain is described as throbbing. The pain is at a severity of 10/10. The pain is moderate. The pain does not radiate. Associated symptoms include nausea (earlier when HA occurred). Pertinent negatives include no fever, no shortness of breath and no vomiting. He has tried acetaminophen for the symptoms. The treatment provided mild relief.     HPI Comments: Jacob Agosto. is a 26 y.o. male who presents to the Emergency Department complaining of HA that began around 9am today, but has had sinus congestion and headaches x1 week. He describes his current headache as 10/10 constant throbbing generalized HA that is nonradiating, with no known aggravating factors, and mildly improved by tylenol he took this morning. States this feels more severe than his prior sinus headaches. He notes associated right ear pain, sore throat, and lightheadedness which also began today. He denies any vision changes, drainage from his ear, drooling, trismus, fever, chills, CP, SOB, cough, abdominal pain, vomiting, diarrhea, constipation, dysuria, hematuria, numbness/tingling, focal weakness, rashes, or any other complaints at this time. Pt also notes he recently went swimming yesterday, and wasn't sure  if he could have swimmer's ear.    Past Medical History:  Diagnosis Date  . Bronchitis   . Cellulitis of right hand - RECURRENT   . Kidney stones   . Kidney stones     Patient Active Problem List   Diagnosis Date Noted  . Hyponatremia 10/26/2016  . Hypokalemia 10/26/2016  . Elevated glucose level   . Cellulitis of hand 08/04/2016  . Carpal tunnel syndrome on left 05/08/2016  . Abscess of upper arm   . Cellulitis 01/22/2016  . Leukocytosis 01/22/2016  . Neuropathy 01/22/2016  . Nephrolithiasis 01/22/2016  . Cellulitis of right hand   . Cellulitis of forearm, right 07/30/2015    Past Surgical History:  Procedure Laterality Date  . CARPAL TUNNEL RELEASE Left 04/09/2016   Procedure: CARPAL TUNNEL RELEASE;  Surgeon: Cammy Copa, MD;  Location: [facility] OR;  Service: Orthopedics;  Laterality: Left;  . HAND SURGERY    . KIDNEY STONE SURGERY     lithortripsy       Home Medications    Prior to Admission medications   Medication Sig Start Date End Date Taking? Authorizing Provider  acetaminophen (TYLENOL) 650 MG CR tablet Take 1,300 mg by mouth every 8 (eight) hours as needed for pain.    [provider]    Family History Family History  Problem Relation Age of Onset  . Hypertension Other   . Diabetes Father     Social History Social History  Substance Use Topics  . Smoking status: Former Games developer  . Smokeless tobacco: Never Used     Comment: States he "vapes"  . Alcohol use No     Comment:  occasionally      Allergies   Coconut flavor and No known allergies   Review of Systems Review of Systems  Constitutional: Negative for chills and fever.  HENT: Positive for congestion, ear pain and sore throat. Negative for ear discharge, rhinorrhea and trouble swallowing.   Eyes: Negative for visual disturbance.  Respiratory: Negative for cough and shortness of breath.   Cardiovascular: Negative for chest pain.  Gastrointestinal: Positive for nausea  (earlier when HA occurred). Negative for abdominal pain, constipation, diarrhea and vomiting.  Genitourinary: Negative for dysuria and hematuria.  Musculoskeletal: Negative for arthralgias and myalgias.  Skin: Negative for rash.  Allergic/Immunologic: Negative for immunocompromised state.  Neurological: Positive for light-headedness and headaches. Negative for weakness and numbness.  Psychiatric/Behavioral: Negative for confusion.  All systems reviewed and are negative for acute change except as noted in the HPI.   Physical Exam Updated Vital Signs BP (!) 150/89   Pulse 100   Temp 98.4 F (36.9 C) (Oral)   Resp 18   SpO2 100%   Physical Exam  Constitutional: He is oriented to person, place, and time. Vital signs are normal. He appears well-developed and well-nourished.  Non-toxic appearance. No distress.  Afebrile, nontoxic, NAD  HENT:  Head: Normocephalic and atraumatic.  Right Ear: Hearing, tympanic membrane, external ear and ear canal normal.  Left Ear: Hearing, tympanic membrane, external ear and ear canal normal.  Nose: Mucosal edema present. Right sinus exhibits maxillary sinus tenderness and frontal sinus tenderness. Left sinus exhibits no maxillary sinus tenderness and no frontal sinus tenderness.  Mouth/Throat: Uvula is midline and mucous membranes are normal. No trismus in the jaw. No uvula swelling. Posterior oropharyngeal erythema present. No oropharyngeal exudate, posterior oropharyngeal edema or tonsillar abscesses. Tonsils are 0 on the right. Tonsils are 0 on the left. No tonsillar exudate.  Ears are clear bilaterally. Nose congested, right sinus TTP. Oropharynx mildly injected with some postnasal drainage, without uvular swelling or deviation, no trismus or drooling, no tonsillar swelling; no exudates.   Eyes: Conjunctivae and EOM are normal. Pupils are equal, round, and reactive to light. Right eye exhibits no discharge. Left eye exhibits no discharge.  PERRL, EOMI, no  nystagmus, no visual field deficits   Neck: Normal range of motion. Neck supple. No spinous process tenderness and no muscular tenderness present. No neck rigidity. Normal range of motion present.  FROM intact without spinous process TTP, no bony stepoffs or deformities, no paraspinous muscle TTP or muscle spasms. No rigidity or meningeal signs. No bruising or swelling.   Cardiovascular: Normal rate, regular rhythm, normal heart sounds and intact distal pulses.  Exam reveals no gallop and no friction rub.   No murmur heard. Pulmonary/Chest: Effort normal and breath sounds normal. No respiratory distress. He has no decreased breath sounds. He has no wheezes. He has no rhonchi. He has no rales.  Abdominal: Soft. Normal appearance and bowel sounds are normal. He exhibits no distension. There is no tenderness. There is no rigidity, no rebound, no guarding, no CVA tenderness, no tenderness at McBurney's point and negative Murphy's sign.  Musculoskeletal: Normal range of motion.  MAE x4 Strength and sensation grossly intact in all extremities Distal pulses intact Gait steady  Neurological: He is alert and oriented to person, place, and time. He has normal strength. No cranial nerve deficit or sensory deficit. Coordination and gait normal. GCS eye subscore is 4. GCS verbal subscore is 5. GCS motor subscore is 6.  CN 2-12 grossly intact A&O x4 GCS  15 Sensation and strength intact Gait nonataxic including with tandem walking Coordination with finger-to-nose WNL Neg pronator drift   Skin: Skin is warm, dry and intact. No rash noted.  Psychiatric: He has a normal mood and affect.  Nursing note and vitals reviewed.    ED Treatments / Results  DIAGNOSTIC STUDIES:  Oxygen Saturation is 100% on RA, normal by my interpretation.    COORDINATION OF CARE:  10:39 AM Discussed treatment plan with pt at bedside and pt agreed to plan.  Labs (all labs ordered are listed, but only abnormal results are  displayed) Labs Reviewed - No data to display  EKG  EKG Interpretation None       Radiology No results found.  Procedures Procedures (including critical care time)  Medications Ordered in ED Medications  metoCLOPramide (REGLAN) injection 10 mg (10 mg Intramuscular Given 11/20/16 1116)    And  ketorolac (TORADOL) 30 MG/ML injection 30 mg (30 mg Intramuscular Given 11/20/16 1116)  dexamethasone (DECADRON) injection 10 mg (10 mg Intramuscular Given 11/20/16 1116)     Initial Impression / Assessment and Plan / ED Course  I have reviewed the triage vital signs and the nursing notes.  Pertinent labs & imaging results that were available during my care of the patient were reviewed by me and considered in my medical decision making (see chart for details).     26 y.o. male here with HA this morning, had headaches this week associated with sinus congestion but states this one is worse because now it's generalized. Some nausea when it started; and having some lightheadedness from the headache. On exam, pt well appearing, calm and cooperative, in NAD; mild sinus congestion and postnasal drainage noted, +R sided maxillary and frontal sinus TTP; no focal neuro deficits, ambulatory with steady gait. Highly doubt emergent secondary etiology of his HA, likely just sinus HA compounded by either migraine or tension headache. Pt states HA is already improving from the tylenol he took earlier. Will give toradol, decadron, and reglan IM (pt opted for IM vs IV), and then d/c home with flonase and reglan rx. Advised tylenol/motrin for pain, and other OTC remedies for symptomatic relief. F/up with PCP in 1wk. I explained the diagnosis and have given explicit precautions to return to the ER including for any other new or worsening symptoms. The patient understands and accepts the medical plan as it's been dictated and I have answered their questions. Discharge instructions concerning home care and prescriptions  have been given. The patient is STABLE and is discharged to home in good condition.   I personally performed the services described in this documentation, which was scribed in my presence. The recorded information has been reviewed and is accurate.    Final Clinical Impressions(s) / ED Diagnoses   Final diagnoses:  Sinus headache  Acute sinusitis, recurrence not specified, unspecified location  Lightheadedness  Nausea    New Prescriptions New Prescriptions   FLUTICASONE (FLONASE) 50 MCG/ACT NASAL SPRAY    Place 2 sprays into both nostrils daily.   METOCLOPRAMIDE (REGLAN) 10 MG TABLET    Take 1 tablet (10 mg total) by mouth every 6 (six) hours as needed for nausea (nausea/headache).       [address], [place] CityMer[place], [place]PA-[place] 11/20/16 1124    Linwood DibblesKnapp, Jon, MD 11/20/16 [phone]

## 2016-11-20 NOTE — ED Notes (Signed)
Bed: WTR5 Expected date:  Expected time:  Means of arrival:  Comments: 

## 2016-11-20 NOTE — ED Triage Notes (Signed)
Pt reports right ear pain , woke up this morning with dizziness. sts was swimming yesterday. Also reports facial a[pressure and congestion.

## 2016-11-20 NOTE — Discharge Instructions (Signed)
Continue to stay well-hydrated. Gargle warm salt water and spit it out. Use chloraseptic spray as needed for sore throat. Continue to alternate between Tylenol and Ibuprofen for pain or fever. Use Mucinex for cough suppression/expectoration of mucus. Use netipot and flonase to help with nasal congestion. May consider over-the-counter Benadryl or other antihistamine to decrease secretions and for help with your symptoms. Use reglan as directed as needed for nausea and headaches. Follow up with your primary care doctor in 5-7 days for recheck of ongoing symptoms. Return to emergency department for emergent changing or worsening of symptoms.

## 2016-12-05 ENCOUNTER — Ambulatory Visit: Payer: BLUE CROSS/BLUE SHIELD | Admitting: Allergy & Immunology

## 2016-12-29 ENCOUNTER — Encounter (HOSPITAL_COMMUNITY): Payer: Self-pay

## 2016-12-29 ENCOUNTER — Emergency Department (HOSPITAL_COMMUNITY)
Admission: EM | Admit: 2016-12-29 | Discharge: 2016-12-29 | Disposition: A | Discharge status: Home / Self Care | Payer: Self-pay | Attending: Emergency Medicine | Admitting: Emergency Medicine

## 2016-12-29 ENCOUNTER — Emergency Department (HOSPITAL_COMMUNITY): Payer: Self-pay

## 2016-12-29 DIAGNOSIS — N23 Unspecified renal colic: Secondary | ICD-10-CM | POA: Insufficient documentation

## 2016-12-29 DIAGNOSIS — Z87891 Personal history of nicotine dependence: Secondary | ICD-10-CM | POA: Insufficient documentation

## 2016-12-29 LAB — CBC WITH DIFFERENTIAL/PLATELET
BASOS ABS: 0 10*3/uL (ref 0.0–0.1)
BASOS PCT: 0 %
EOS ABS: 0.2 10*3/uL (ref 0.0–0.7)
EOS PCT: 2 %
HCT: 42.8 % (ref 39.0–52.0)
Hemoglobin: 15 g/dL (ref 13.0–17.0)
LYMPHS ABS: 3.5 10*3/uL (ref 0.7–4.0)
LYMPHS PCT: 37 %
MCH: 29.6 pg (ref 26.0–34.0)
MCHC: 35 g/dL (ref 30.0–36.0)
MCV: 84.6 fL (ref 78.0–100.0)
MONOS PCT: 9 %
Monocytes Absolute: 0.8 10*3/uL (ref 0.1–1.0)
Neutro Abs: 4.9 10*3/uL (ref 1.7–7.7)
Neutrophils Relative %: 52 %
Platelets: 311 10*3/uL (ref 150–400)
RBC: 5.06 MIL/uL (ref 4.22–5.81)
RDW: 14 % (ref 11.5–15.5)
WBC: 9.3 10*3/uL (ref 4.0–10.5)

## 2016-12-29 LAB — COMPREHENSIVE METABOLIC PANEL
ALBUMIN: 3.8 g/dL (ref 3.5–5.0)
ALT: 20 U/L (ref 17–63)
AST: 20 U/L (ref 15–41)
Alkaline Phosphatase: 64 U/L (ref 38–126)
Anion gap: 7 (ref 5–15)
BILIRUBIN TOTAL: 0.3 mg/dL (ref 0.3–1.2)
BUN: 11 mg/dL (ref 6–20)
CHLORIDE: 107 mmol/L (ref 101–111)
CO2: 24 mmol/L (ref 22–32)
Calcium: 8.9 mg/dL (ref 8.9–10.3)
Creatinine, Ser: 0.88 mg/dL (ref 0.61–1.24)
GFR calc Af Amer: >60 mL/min (ref >60)
GFR calc non Af Amer: >60 mL/min (ref >60)
GLUCOSE: 101 mg/dL — AB (ref 65–99)
POTASSIUM: 4.4 mmol/L (ref 3.5–5.1)
SODIUM: 138 mmol/L (ref 135–145)
TOTAL PROTEIN: 7.7 g/dL (ref 6.5–8.1)

## 2016-12-29 LAB — URINALYSIS, ROUTINE W REFLEX MICROSCOPIC
Bacteria, UA: NONE SEEN
Bilirubin Urine: NEGATIVE
Glucose, UA: NEGATIVE mg/dL
Ketones, ur: NEGATIVE mg/dL
LEUKOCYTES UA: NEGATIVE
NITRITE: NEGATIVE
PH: 6 (ref 5.0–8.0)
Protein, ur: NEGATIVE mg/dL
Specific Gravity, Urine: 1.013 (ref 1.005–1.030)
Squamous Epithelial / LPF: NONE SEEN

## 2016-12-29 LAB — CK: Total CK: 66 U/L (ref 49–397)

## 2016-12-29 MED ORDER — SODIUM CHLORIDE 0.9 % IV BOLUS (SEPSIS)
1000.0000 mL | Freq: Once | INTRAVENOUS | Status: AC
  Administered 2016-12-29: 1000 mL via INTRAVENOUS

## 2016-12-29 MED ORDER — TAMSULOSIN HCL 0.4 MG PO CAPS: 0.4000 mg | ORAL_CAPSULE | Freq: Every day | ORAL | 0 refills | Status: DC

## 2016-12-29 MED ORDER — IBUPROFEN 800 MG PO TABS: 800.0000 mg | ORAL_TABLET | Freq: Three times a day (TID) | ORAL | 0 refills | Status: DC

## 2016-12-29 MED ORDER — HYDROMORPHONE HCL 1 MG/ML IJ SOLN
1.0000 mg | Freq: Once | INTRAMUSCULAR | Status: AC
  Administered 2016-12-29: 1 mg via INTRAVENOUS
  Filled 2016-12-29: qty 1

## 2016-12-29 MED ORDER — OXYCODONE-ACETAMINOPHEN 5-325 MG PO TABS
2.0000 | ORAL_TABLET | Freq: Once | ORAL | Status: AC
  Administered 2016-12-29: 2 via ORAL
  Filled 2016-12-29: qty 2

## 2016-12-29 MED ORDER — OXYCODONE-ACETAMINOPHEN 5-325 MG PO TABS: 1.0000 | ORAL_TABLET | Freq: Four times a day (QID) | ORAL | 0 refills | Status: DC | PRN

## 2016-12-29 MED ORDER — MORPHINE SULFATE (PF) 2 MG/ML IV SOLN
4.0000 mg | Freq: Once | INTRAVENOUS | Status: AC
  Administered 2016-12-29: 4 mg via INTRAVENOUS
  Filled 2016-12-29: qty 2

## 2016-12-29 MED ORDER — KETOROLAC TROMETHAMINE 30 MG/ML IJ SOLN
30.0000 mg | Freq: Once | INTRAMUSCULAR | Status: AC
  Administered 2016-12-29: 30 mg via INTRAVENOUS
  Filled 2016-12-29: qty 1

## 2016-12-29 MED ORDER — ONDANSETRON HCL 4 MG/2ML IJ SOLN
4.0000 mg | Freq: Once | INTRAMUSCULAR | Status: AC
  Administered 2016-12-29: 4 mg via INTRAVENOUS
  Filled 2016-12-29: qty 2

## 2016-12-29 NOTE — ED Notes (Signed)
Bed: WA22 Expected date:  Expected time:  Means of arrival:  Comments: 

## 2016-12-29 NOTE — ED Provider Notes (Signed)
[facility]-EMERGENCY DEPT Provider Note   CSN: [id number] Arrival date & time: 12/29/16  1051     History   Chief Complaint No chief complaint on file.   HPI Jacob Morasdrian L Fanning Jr. is a 26 y.o. male history of recurrent kidney stones requiring lithotripsy here presenting with right flank pain, hematuria. Patient states that he noticed right flank pain for the last 3 days. Since yesterday, he noticed hematuria as well. He denies any dysuria but just has severe lower abdominal pain. States that he has history kidney stones and had lithotripsy in the past. Has some nausea and vomiting but denies any fevers.  The history is provided by the patient.    Past Medical History:  Diagnosis Date  . Bronchitis   . Cellulitis of right hand - RECURRENT   . Kidney stones   . Kidney stones     Patient Active Problem List   Diagnosis Date Noted  . Hyponatremia 10/26/2016  . Hypokalemia 10/26/2016  . Elevated glucose level   . Cellulitis of hand 08/04/2016  . Carpal tunnel syndrome on left 05/08/2016  . Abscess of upper arm   . Cellulitis 01/22/2016  . Leukocytosis 01/22/2016  . Neuropathy 01/22/2016  . Nephrolithiasis 01/22/2016  . Cellulitis of right hand   . Cellulitis of forearm, right 07/30/2015    Past Surgical History:  Procedure Laterality Date  . CARPAL TUNNEL RELEASE Left 04/09/2016   Procedure: CARPAL TUNNEL RELEASE;  Surgeon: Cammy CopaScott Gregory [name], MD;  [facility];  Service: Orthopedics;  Laterality: Left;  . HAND SURGERY    . KIDNEY STONE SURGERY     lithortripsy       Home Medications    Prior to Admission medications   Medication Sig Start Date End Date Taking? Authorizing Provider  acetaminophen (TYLENOL) 650 MG CR tablet Take 1,300 mg by mouth every 8 (eight) hours as needed for pain.   Yes [provider]    Family History Family History  Problem Relation Age of Onset  . Hypertension Other   . Diabetes Father     Social History Social History    Substance Use Topics  . Smoking status: Former Games developermoker  . Smokeless tobacco: Never Used     Comment: States he "vapes"  . Alcohol use No     Comment: occasionally      Allergies   Coconut flavor and No known allergies   Review of Systems Review of Systems  Genitourinary: Positive for flank pain and hematuria.  All other systems reviewed and are negative.    Physical Exam Updated Vital Signs BP 116/70 (BP Location: Right Arm)   Pulse 90   Temp 98.6 F (37 C) (Oral)   Resp 17   Ht 5\' 11"  (1.803 m)   Wt 113.4 kg (250 lb)   SpO2 99%   BMI 34.87 kg/m   Physical Exam  Constitutional: He is oriented to person, place, and time.  Uncomfortable   HENT:  Head: Normocephalic.  Eyes: Pupils are equal, round, and reactive to light. Conjunctivae and EOM are normal.  Neck: Normal range of motion. Neck supple.  Cardiovascular: Normal rate, regular rhythm and normal heart sounds.   Pulmonary/Chest: Effort normal and breath sounds normal. No respiratory distress. He has no wheezes.  Abdominal: Soft. Bowel sounds are normal.  Mild suprapubic and R CVAT.   Genitourinary:  Genitourinary Comments: No scrotal swelling, no testicular tenderness   Musculoskeletal: Normal range of motion.  Neurological: He is alert and oriented  to person, place, and time.  Skin: Skin is warm.  Psychiatric: He has a normal mood and affect.  Nursing note and vitals reviewed.    ED Treatments / Results  Labs (all labs ordered are listed, but only abnormal results are displayed) Labs Reviewed  URINALYSIS, ROUTINE W REFLEX MICROSCOPIC - Abnormal; Notable for the following:       Result Value   Hgb urine dipstick SMALL (*)    All other components within normal limits  COMPREHENSIVE METABOLIC PANEL - Abnormal; Notable for the following:    Glucose, Bld 101 (*)    All other components within normal limits  CBC WITH DIFFERENTIAL/PLATELET  CK    EKG  EKG Interpretation None       Radiology Ct  Renal Stone Study  Result Date: 12/29/2016 CLINICAL DATA:  Flank and back pain radiating to the groin, side not specified, pain getting worse and keeping patient up all night, history kidney stones EXAM: CT ABDOMEN AND PELVIS WITHOUT CONTRAST TECHNIQUE: Multidetector CT imaging of the abdomen and pelvis was performed following the standard protocol without IV contrast. Sagittal and coronal MPR images reconstructed from axial data set. Oral contrast not administered for this indication. COMPARISON:  11/28/2014 FINDINGS: Lower chest: Lung bases clear Hepatobiliary: Gallbladder and liver normal appearance Pancreas: Normal appearance Spleen: Normal appearance Adrenals/Urinary Tract: Adrenal glands normal appearance. Mild LEFT hydronephrosis secondary to a 2 mm diameter proximal LEFT ureteral calculus. Kidneys, ureters, and bladder otherwise normal appearance. Stomach/Bowel: Normal appendix. Stomach and bowel loops normal appearance. Vascular/Lymphatic: Vascular structures normal appearance. No adenopathy. Few scattered normal sized mesenteric lymph nodes. Reproductive: Unremarkable prostate gland and seminal vesicles Other: No free air or free fluid. Tiny LEFT inguinal hernia containing fat. Musculoskeletal: No acute osseous findings. IMPRESSION: Mild LEFT hydronephrosis secondary to a 2 mm proximal LEFT ureteral calculus. Electronically Signed   By: Ulyses Southward M.D.   On: 12/29/2016 12:47    Procedures Procedures (including critical care time)  Medications Ordered in ED Medications  sodium chloride 0.9 % bolus 1,000 mL (0 mLs Intravenous Stopped 12/29/16 1428)  morphine 2 MG/ML injection 4 mg (4 mg Intravenous Given 12/29/16 1230)  ondansetron (ZOFRAN) injection 4 mg (4 mg Intravenous Given 12/29/16 1230)  HYDROmorphone (DILAUDID) injection 1 mg (1 mg Intravenous Given 12/29/16 1316)  ketorolac (TORADOL) 30 MG/ML injection 30 mg (30 mg Intravenous Given 12/29/16 1316)  HYDROmorphone (DILAUDID) injection 1 mg  (1 mg Intravenous Given 12/29/16 1429)  oxyCODONE-acetaminophen (PERCOCET/ROXICET) 5-325 MG per tablet 2 tablet (2 tablets Oral Given 12/29/16 1429)     Initial Impression / Assessment and Plan / ED Course  I have reviewed the triage vital signs and the nursing notes.  Pertinent labs & imaging results that were available during my care of the patient were reviewed by me and considered in my medical decision making (see chart for details).    Robbert Langlinais. is a 26 y.o. male here with R flank pain, hematuria. Hx of kidney stone so likely recurrent renal colic. Will get labs, UA, CT renal stone. Testicles nontender.   2:47 PM CT showed 2 mm L ureteral stone with hydro. UA + blood with no UTI. Pain controlled with pain meds, toradol, IVF. Will dc home with motrin, percocet, flomax. He has seen [facility] before and will refer him back there.    Final Clinical Impressions(s) / ED Diagnoses   Final diagnoses:  None    New Prescriptions New Prescriptions   No medications on  file     Charlynne Pander, MD 12/29/16 [phone]

## 2016-12-29 NOTE — Discharge Instructions (Signed)
Take motrin for pain.   Take percocet for severe pain. Do not drive with it   Take flomax daily.   Stay hydrated,   Call urology tomorrow for follow up   Return to ER if you have severe abdominal pain, vomiting, fever.

## 2016-12-29 NOTE — ED Notes (Signed)
RN STARTING IV 

## 2016-12-29 NOTE — ED Triage Notes (Signed)
Patient presents with c/o flank/groin pain radiating to the the back. Patient state his urine is been bloody since yesterday and pain is getting worse and keep up all night.

## 2017-05-22 ENCOUNTER — Emergency Department (HOSPITAL_COMMUNITY): Payer: Self-pay

## 2017-05-22 ENCOUNTER — Encounter (HOSPITAL_COMMUNITY): Payer: Self-pay | Admitting: *Deleted

## 2017-05-22 ENCOUNTER — Other Ambulatory Visit: Payer: Self-pay

## 2017-05-22 ENCOUNTER — Emergency Department (HOSPITAL_COMMUNITY)
Admission: EM | Admit: 2017-05-22 | Discharge: 2017-05-22 | Disposition: A | Discharge status: Home / Self Care | Payer: Self-pay | Attending: Emergency Medicine | Admitting: Emergency Medicine

## 2017-05-22 DIAGNOSIS — X500XXA Overexertion from strenuous movement or load, initial encounter: Secondary | ICD-10-CM | POA: Insufficient documentation

## 2017-05-22 DIAGNOSIS — Z87891 Personal history of nicotine dependence: Secondary | ICD-10-CM | POA: Insufficient documentation

## 2017-05-22 DIAGNOSIS — Y929 Unspecified place or not applicable: Secondary | ICD-10-CM | POA: Insufficient documentation

## 2017-05-22 DIAGNOSIS — Y990 Civilian activity done for income or pay: Secondary | ICD-10-CM | POA: Insufficient documentation

## 2017-05-22 DIAGNOSIS — Y939 Activity, unspecified: Secondary | ICD-10-CM | POA: Insufficient documentation

## 2017-05-22 DIAGNOSIS — S46912A Strain of unspecified muscle, fascia and tendon at shoulder and upper arm level, left arm, initial encounter: Secondary | ICD-10-CM | POA: Insufficient documentation

## 2017-05-22 DIAGNOSIS — Z79899 Other long term (current) drug therapy: Secondary | ICD-10-CM | POA: Insufficient documentation

## 2017-05-22 MED ORDER — DICLOFENAC SODIUM 50 MG PO TBEC: 50.0000 mg | DELAYED_RELEASE_TABLET | Freq: Two times a day (BID) | ORAL | 0 refills | Status: DC

## 2017-05-22 MED ORDER — CYCLOBENZAPRINE HCL 10 MG PO TABS
10.0000 mg | ORAL_TABLET | Freq: Once | ORAL | Status: AC
Start: 2017-05-22 — End: 2017-05-22
  Administered 2017-05-22: 10 mg via ORAL
  Filled 2017-05-22: qty 1

## 2017-05-22 MED ORDER — CYCLOBENZAPRINE HCL 10 MG PO TABS: 10.0000 mg | ORAL_TABLET | Freq: Two times a day (BID) | ORAL | 0 refills | Status: DC | PRN

## 2017-05-22 MED ORDER — KETOROLAC TROMETHAMINE 60 MG/2ML IM SOLN
30.0000 mg | Freq: Once | INTRAMUSCULAR | Status: AC
  Administered 2017-05-22: 30 mg via INTRAMUSCULAR
  Filled 2017-05-22: qty 2

## 2017-05-22 NOTE — Discharge Instructions (Signed)
Do not take the muscle relaxer if driving as it will make you sleepy. Follow up with your primary care doctor. Return here as needed.

## 2017-05-22 NOTE — ED Triage Notes (Signed)
Several weeks ago pt pulled left shoulder working for [employer]. Has not followed up for eval. Now pain has increased. Tenderness in left neck and clavicle area upon assessment

## 2017-05-22 NOTE — ED Provider Notes (Signed)
[facility]-EMERGENCY DEPT Provider Note   CSN: [id number] Arrival date & time: 05/22/17  1916     History   Chief Complaint Chief Complaint  Patient presents with  . Shoulder Pain    Left    HPI Jacob Nixon. is a 27 y.o. male who presents to the ED with left shoulder pain. Patient reports that 2 weeks ago the pain started while at work for [employer]. He was doing a lot of lifting and moving heavy packages during the holidays. The pain has gotten worse. Patient has been taking ibuprofen and tylenol without relief.   HPI  Past Medical History:  Diagnosis Date  . Bronchitis   . Cellulitis of right hand - RECURRENT   . Kidney stones   . Kidney stones     Patient Active Problem List   Diagnosis Date Noted  . Hyponatremia 10/26/2016  . Hypokalemia 10/26/2016  . Elevated glucose level   . Cellulitis of hand 08/04/2016  . Carpal tunnel syndrome on left 05/08/2016  . Abscess of upper arm   . Cellulitis 01/22/2016  . Leukocytosis 01/22/2016  . Neuropathy 01/22/2016  . Nephrolithiasis 01/22/2016  . Cellulitis of right hand   . Cellulitis of forearm, right 07/30/2015    Past Surgical History:  Procedure Laterality Date  . CARPAL TUNNEL RELEASE Left 04/09/2016   Procedure: CARPAL TUNNEL RELEASE;  Surgeon: Cammy Copa, MD;  Location: [facility] OR;  Service: Orthopedics;  Laterality: Left;  . HAND SURGERY    . KIDNEY STONE SURGERY     lithortripsy       Home Medications    Prior to Admission medications   Medication Sig Start Date End Date Taking? Authorizing Provider  acetaminophen (TYLENOL) 650 MG CR tablet Take 1,300 mg by mouth every 8 (eight) hours as needed for pain.    [provider]  cyclobenzaprine (FLEXERIL) 10 MG tablet Take 1 tablet (10 mg total) by mouth 2 (two) times daily as needed for muscle spasms. 05/22/17   Janne Napoleon, NP  diclofenac (VOLTAREN) 50 MG EC tablet Take 1 tablet (50 mg total) by mouth 2 (two) times daily.  05/22/17   Janne Napoleon, NP  ibuprofen (ADVIL,MOTRIN) 800 MG tablet Take 1 tablet (800 mg total) by mouth 3 (three) times daily. 12/29/16   Charlynne Pander, MD  oxyCODONE-acetaminophen (PERCOCET) 5-325 MG tablet Take 1 tablet by mouth every 6 (six) hours as needed. 12/29/16   Charlynne Pander, MD  tamsulosin (FLOMAX) 0.4 MG CAPS capsule Take 1 capsule (0.4 mg total) by mouth daily. 12/29/16   Charlynne Pander, MD    Family History Family History  Problem Relation Age of Onset  . Hypertension Other   . Diabetes Father     Social History Social History   Tobacco Use  . Smoking status: Former Games developer  . Smokeless tobacco: Never Used  . Tobacco comment: States he "vapes"  Substance Use Topics  . Alcohol use: No    Comment: occasionally   . Drug use: No     Allergies   Coconut flavor and No known allergies   Review of Systems Review of Systems  Constitutional: Negative for chills and fever.  HENT: Negative.   Respiratory: Negative for cough and shortness of breath.   Cardiovascular: Negative for chest pain.  Gastrointestinal: Negative for nausea and vomiting.  Musculoskeletal: Positive for arthralgias.       Left shoulder  Skin: Negative for wound.  Neurological: Negative  for syncope and headaches.  Psychiatric/Behavioral: Negative for confusion.     Physical Exam Updated Vital Signs BP 131/86 (BP Location: Right Arm)   Pulse 89   Temp 98.6 F (37 C) (Oral)   Resp 17   Ht 5\' 11"  (1.803 m)   Wt 117.9 kg (260 lb)   SpO2 100%   BMI 36.26 kg/m   Physical Exam  Constitutional: He appears well-developed and well-nourished. No distress.  HENT:  Head: Normocephalic.  Eyes: EOM are normal.  Neck: Neck supple.  Cardiovascular: Normal rate and regular rhythm.  Pulmonary/Chest: Effort normal and breath sounds normal.  Musculoskeletal:       Left shoulder: He exhibits tenderness and spasm. He exhibits no deformity, no laceration, normal pulse and normal strength.  Decreased range of motion: due to pain.       Arms: Radial pulses 2+, adequate circulation, equal grips. Pain with range of motion of the left shoulder. Muscle spasm noted.   Neurological: He is alert.  Skin: Skin is warm and dry.  Psychiatric: He has a normal mood and affect.  Nursing note and vitals reviewed.    ED Treatments / Results  Labs (all labs ordered are listed, but only abnormal results are displayed) Labs Reviewed - No data to display Radiology Dg Shoulder Left  Result Date: 05/22/2017 CLINICAL DATA:  Left shoulder pain, no known injury, initial encounter EXAM: LEFT SHOULDER - 2+ VIEW COMPARISON:  None. FINDINGS: There is no evidence of fracture or dislocation. There is no evidence of arthropathy or other focal bone abnormality. Soft tissues are unremarkable. IMPRESSION: No acute abnormality noted. Electronically Signed   By: Alcide CleverMark  Lukens M.D.   On: 05/22/2017 21:24    Procedures Procedures (including critical care time)  Medications Ordered in ED Medications  cyclobenzaprine (FLEXERIL) tablet 10 mg (10 mg Oral Given 05/22/17 2101)  ketorolac (TORADOL) injection 30 mg (30 mg Intramuscular Given 05/22/17 2101)     Initial Impression / Assessment and Plan / ED Course  I have reviewed the triage vital signs and the nursing notes. 27 y.o. male with left shoulder pain and spasm after lifting and moving boxes while working for [employer] during the holidays stable for d/c without fracture or dislocation noted on x-ray. Will treat for muscle spasm and pain. Patient to f/u with is PCP. Return precautions discussed.  Final Clinical Impressions(s) / ED Diagnoses   Final diagnoses:  Strain of left shoulder, initial encounter    ED Discharge Orders        Ordered    cyclobenzaprine (FLEXERIL) 10 MG tablet  2 times daily PRN     05/22/17 2139    diclofenac (VOLTAREN) 50 MG EC tablet  2 times daily     05/22/17 2139       Kerrie Buffaloeese, Hope [place] CityM, NP 05/22/17 2144    Linwood DibblesKnapp, Jon,  MD 05/22/17 2306

## 2017-07-21 ENCOUNTER — Emergency Department (HOSPITAL_COMMUNITY)
Admission: EM | Admit: 2017-07-21 | Discharge: 2017-07-21 | Disposition: A | Discharge status: Home / Self Care | Payer: BLUE CROSS/BLUE SHIELD | Attending: Emergency Medicine | Admitting: Emergency Medicine

## 2017-07-21 ENCOUNTER — Encounter (HOSPITAL_COMMUNITY): Payer: Self-pay

## 2017-07-21 ENCOUNTER — Emergency Department (HOSPITAL_COMMUNITY): Payer: BLUE CROSS/BLUE SHIELD

## 2017-07-21 DIAGNOSIS — Z87891 Personal history of nicotine dependence: Secondary | ICD-10-CM | POA: Insufficient documentation

## 2017-07-21 DIAGNOSIS — Z79899 Other long term (current) drug therapy: Secondary | ICD-10-CM | POA: Insufficient documentation

## 2017-07-21 DIAGNOSIS — J40 Bronchitis, not specified as acute or chronic: Secondary | ICD-10-CM

## 2017-07-21 DIAGNOSIS — J209 Acute bronchitis, unspecified: Secondary | ICD-10-CM | POA: Insufficient documentation

## 2017-07-21 MED ORDER — HYDROCOD POLST-CPM POLST ER 10-8 MG/5ML PO SUER: 5.0000 mL | Freq: Two times a day (BID) | ORAL | 0 refills | Status: DC | PRN

## 2017-07-21 MED ORDER — ALBUTEROL SULFATE HFA 108 (90 BASE) MCG/ACT IN AERS
1.0000 | INHALATION_SPRAY | Freq: Once | RESPIRATORY_TRACT | Status: AC
  Administered 2017-07-21: 2 via RESPIRATORY_TRACT
  Filled 2017-07-21: qty 6.7

## 2017-07-21 MED ORDER — ALBUTEROL SULFATE HFA 108 (90 BASE) MCG/ACT IN AERS: 1.0000 | INHALATION_SPRAY | Freq: Four times a day (QID) | RESPIRATORY_TRACT | 0 refills | Status: DC | PRN

## 2017-07-21 MED ORDER — AZITHROMYCIN 250 MG PO TABS: 250.0000 mg | ORAL_TABLET | Freq: Every day | ORAL | 0 refills | Status: DC

## 2017-07-21 MED ORDER — ALBUTEROL SULFATE (2.5 MG/3ML) 0.083% IN NEBU
5.0000 mg | INHALATION_SOLUTION | Freq: Once | RESPIRATORY_TRACT | Status: AC
  Administered 2017-07-21: 5 mg via RESPIRATORY_TRACT
  Filled 2017-07-21: qty 6

## 2017-07-21 NOTE — ED Notes (Signed)
Discharge instructions reviewed with patient. Patient verbalizes understanding. VSS.   

## 2017-07-21 NOTE — ED Provider Notes (Signed)
[facility]-EMERGENCY DEPT Provider Note   CSN: [id number] Arrival date & time: 07/21/17  1646     History   Chief Complaint Chief Complaint  Patient presents with  . Cough    HPI Jacob Morasdrian L Lerman Jr. is a 27 y.o. male.  27 year old male presents with complaint of cough and congestion.  Patient reports a prior history of bronchitis with associated wheezing.  Patient reports that over the last week he has had cough and congestion.  Patient denies fever.  Patient reports he is actually feeling a little bit better over the last 2 days.  Patient is concerned that he may have a pneumonia.  Patient denies chest pain, nausea, vomiting, diaphoresis, back pain, or other acute complaint.  He should request a refill on albuterol MDI.  He used an MDI during his last round of bronchitis and that helped significantly. He denies any diagnosis of asthma. He denies recent use of prednisone.    The history is provided by the patient.  Cough  This is a new problem. The current episode started more than 1 week ago. The problem occurs constantly. The problem has been gradually improving. The cough is non-productive. There has been no fever. Pertinent negatives include no chest pain, no chills and no sweats. He has tried decongestants and cough syrup for the symptoms. The treatment provided mild relief. He is not a smoker.    Past Medical History:  Diagnosis Date  . Bronchitis   . Cellulitis of right hand - RECURRENT   . Kidney stones   . Kidney stones     Patient Active Problem List   Diagnosis Date Noted  . Hyponatremia 10/26/2016  . Hypokalemia 10/26/2016  . Elevated glucose level   . Cellulitis of hand 08/04/2016  . Carpal tunnel syndrome on left 05/08/2016  . Abscess of upper arm   . Cellulitis 01/22/2016  . Leukocytosis 01/22/2016  . Neuropathy 01/22/2016  . Nephrolithiasis 01/22/2016  . Cellulitis of right hand   . Cellulitis of forearm, right 07/30/2015    Past  Surgical History:  Procedure Laterality Date  . CARPAL TUNNEL RELEASE Left 04/09/2016   Procedure: CARPAL TUNNEL RELEASE;  Surgeon: Cammy CopaScott Gregory [name], MD;  [facility];  Service: Orthopedics;  Laterality: Left;  . HAND SURGERY    . KIDNEY STONE SURGERY     lithortripsy       Home Medications    Prior to Admission medications   Medication Sig Start Date End Date Taking? Authorizing Provider  acetaminophen (TYLENOL) 650 MG CR tablet Take 1,300 mg by mouth every 8 (eight) hours as needed for pain.    [provider]  albuterol (PROVENTIL HFA;VENTOLIN HFA) 108 (90 Base) MCG/ACT inhaler Inhale 1-2 puffs into the lungs every 6 (six) hours as needed for wheezing or shortness of breath. 07/21/17   Wynetta FinesMessick, Parneet Glantz C, MD  azithromycin (ZITHROMAX) 250 MG tablet Take 1 tablet (250 mg total) by mouth daily. Take first 2 tablets together, then 1 every day until finished. 07/21/17   Wynetta FinesMessick, Jo Booze C, MD  chlorpheniramine-HYDROcodone (TUSSIONEX PENNKINETIC ER) 10-8 MG/5ML SUER Take 5 mLs by mouth every 12 (twelve) hours as needed for cough. 07/21/17   Wynetta FinesMessick, Serra Younan C, MD  cyclobenzaprine (FLEXERIL) 10 MG tablet Take 1 tablet (10 mg total) by mouth 2 (two) times daily as needed for muscle spasms. 05/22/17   Janne NapoleonNeese, Hope M, NP  diclofenac (VOLTAREN) 50 MG EC tablet Take 1 tablet (50 mg total) by mouth 2 (  two) times daily. 05/22/17   Janne Napoleon, NP  ibuprofen (ADVIL,MOTRIN) 800 MG tablet Take 1 tablet (800 mg total) by mouth 3 (three) times daily. 12/29/16   Charlynne Pander, MD  oxyCODONE-acetaminophen (PERCOCET) 5-325 MG tablet Take 1 tablet by mouth every 6 (six) hours as needed. 12/29/16   Charlynne Pander, MD  tamsulosin (FLOMAX) 0.4 MG CAPS capsule Take 1 capsule (0.4 mg total) by mouth daily. 12/29/16   Charlynne Pander, MD    Family History Family History  Problem Relation Age of Onset  . Hypertension Other   . Diabetes Father     Social History Social History   Tobacco Use  .  Smoking status: Former Games developer  . Smokeless tobacco: Never Used  . Tobacco comment: States he "vapes"  Substance Use Topics  . Alcohol use: No    Comment: occasionally   . Drug use: No     Allergies   Coconut flavor and No known allergies   Review of Systems Review of Systems  Constitutional: Negative for chills.  Respiratory: Positive for cough.   Cardiovascular: Negative for chest pain.  All other systems reviewed and are negative.    Physical Exam Updated Vital Signs BP 140/88 (BP Location: Left Arm)   Pulse 99   Temp 98.8 F (37.1 C) (Oral)   Resp 20   Ht 5\' 11"  (1.803 m)   Wt 122 kg (269 lb)   SpO2 97%   BMI 37.52 kg/m   Physical Exam  Constitutional: He is oriented to person, place, and time. He appears well-developed and well-nourished. No distress.  HENT:  Head: Normocephalic and atraumatic.  Mouth/Throat: Oropharynx is clear and moist.  Eyes: Conjunctivae and EOM are normal. Pupils are equal, round, and reactive to light.  Neck: Normal range of motion. Neck supple.  Cardiovascular: Normal rate, regular rhythm and normal heart sounds.  Pulmonary/Chest: Effort normal and breath sounds normal. No respiratory distress.  Abdominal: Soft. He exhibits no distension. There is no tenderness.  Musculoskeletal: Normal range of motion. He exhibits no edema or deformity.  Neurological: He is alert and oriented to person, place, and time.  Skin: Skin is warm and dry.  Psychiatric: He has a normal mood and affect.  Nursing note and vitals reviewed.    ED Treatments / Results  Labs (all labs ordered are listed, but only abnormal results are displayed) Labs Reviewed - No data to display  EKG  EKG Interpretation None       Radiology Dg Chest 2 View  Result Date: 07/21/2017 CLINICAL DATA:  Productive cough for a week and half with hemoptysis 3 days ago. On and off fever. EXAM: CHEST  2 VIEW COMPARISON:  03/31/2017 FINDINGS: The heart size and mediastinal  contours are within normal limits. Both lungs are clear. The visualized skeletal structures are unremarkable. IMPRESSION: No active cardiopulmonary disease. Electronically Signed   By: Tollie Eth M.D.   On: 07/21/2017 19:03    Procedures Procedures (including critical care time)  Medications Ordered in ED Medications  albuterol (PROVENTIL HFA;VENTOLIN HFA) 108 (90 Base) MCG/ACT inhaler 1-2 puff (not administered)  albuterol (PROVENTIL) (2.5 MG/3ML) 0.083% nebulizer solution 5 mg (5 mg Nebulization Given 07/21/17 1801)     Initial Impression / Assessment and Plan / ED Course  I have reviewed the triage vital signs and the nursing notes.  Pertinent labs & imaging results that were available during my care of the patient were reviewed by me and considered in my  medical decision making (see chart for details).     MDM  Screen complete  She is presenting with a typical presentation for bronchitis.  Screening chest x-ray does not reveal infiltrates or other evidence of possible pneumonia.  Patient feels significant improvement upon my evaluation.  He was given an albuterol treatment in triage.  I will prescribe him a albuterol MDI for home use.  He request a short course of a Z-Pak - I advised him that his symptoms are likely viral in etiology. However, he is persistent in requesting a Z-pack, so I will prescribe this for him.  Close follow-up is advised.  Strict return precautions given and understood.  Final Clinical Impressions(s) / ED Diagnoses   Final diagnoses:  Bronchitis    ED Discharge Orders        Ordered    albuterol (PROVENTIL HFA;VENTOLIN HFA) 108 (90 Base) MCG/ACT inhaler  Every 6 hours PRN     07/21/17 2043    azithromycin (ZITHROMAX) 250 MG tablet  Daily     07/21/17 2043    chlorpheniramine-HYDROcodone (TUSSIONEX PENNKINETIC ER) 10-8 MG/5ML SUER  Every 12 hours PRN     07/21/17 2043       Wynetta Fines, MD 07/21/17 2049

## 2017-07-21 NOTE — ED Triage Notes (Signed)
patient c/o non productive cough x 1 week. Patient states coughing is worse at night. Patient c/o expiratory wheezing. Patient states he had an old albuterol inhaler, but could not find.

## 2017-09-16 ENCOUNTER — Encounter (HOSPITAL_COMMUNITY): Payer: Self-pay | Admitting: Emergency Medicine

## 2017-09-16 ENCOUNTER — Emergency Department (HOSPITAL_COMMUNITY)
Admission: EM | Admit: 2017-09-16 | Discharge: 2017-09-17 | Disposition: A | Discharge status: Home / Self Care | Payer: Self-pay | Attending: Emergency Medicine | Admitting: Emergency Medicine

## 2017-09-16 DIAGNOSIS — R319 Hematuria, unspecified: Secondary | ICD-10-CM

## 2017-09-16 DIAGNOSIS — N201 Calculus of ureter: Secondary | ICD-10-CM | POA: Insufficient documentation

## 2017-09-16 DIAGNOSIS — Z79899 Other long term (current) drug therapy: Secondary | ICD-10-CM | POA: Insufficient documentation

## 2017-09-16 DIAGNOSIS — Z87442 Personal history of urinary calculi: Secondary | ICD-10-CM | POA: Insufficient documentation

## 2017-09-16 DIAGNOSIS — N390 Urinary tract infection, site not specified: Secondary | ICD-10-CM | POA: Insufficient documentation

## 2017-09-16 DIAGNOSIS — Z87891 Personal history of nicotine dependence: Secondary | ICD-10-CM | POA: Insufficient documentation

## 2017-09-16 LAB — URINALYSIS, ROUTINE W REFLEX MICROSCOPIC
BILIRUBIN URINE: NEGATIVE
GLUCOSE, UA: NEGATIVE mg/dL
Ketones, ur: NEGATIVE mg/dL
LEUKOCYTES UA: NEGATIVE
NITRITE: NEGATIVE
PH: 5 (ref 5.0–8.0)
Protein, ur: NEGATIVE mg/dL
SPECIFIC GRAVITY, URINE: 1.023 (ref 1.005–1.030)

## 2017-09-16 MED ORDER — SODIUM CHLORIDE 0.9 % IV SOLN
1.0000 g | Freq: Once | INTRAVENOUS | Status: AC
  Administered 2017-09-17: 1 g via INTRAVENOUS
  Filled 2017-09-16: qty 10

## 2017-09-16 MED ORDER — OXYMETAZOLINE HCL 0.05 % NA SOLN
1.0000 | Freq: Once | NASAL | Status: AC
  Administered 2017-09-17: 1 via NASAL
  Filled 2017-09-16: qty 15

## 2017-09-16 MED ORDER — ONDANSETRON HCL 4 MG/2ML IJ SOLN
4.0000 mg | Freq: Once | INTRAMUSCULAR | Status: AC
  Administered 2017-09-17: 4 mg via INTRAVENOUS
  Filled 2017-09-16: qty 2

## 2017-09-16 MED ORDER — KETOROLAC TROMETHAMINE 30 MG/ML IJ SOLN
30.0000 mg | Freq: Once | INTRAMUSCULAR | Status: AC
  Administered 2017-09-17: 30 mg via INTRAVENOUS
  Filled 2017-09-16: qty 1

## 2017-09-16 NOTE — ED Triage Notes (Signed)
Patient c/o left side flank pain today. Hx kidney stones. Denies N/V/D.

## 2017-09-16 NOTE — ED Notes (Signed)
Blood work sent down as hold tubes

## 2017-09-16 NOTE — ED Notes (Signed)
Pt instructed to change into a gown and writer would return to room to obtain a new set of vital signs.

## 2017-09-17 ENCOUNTER — Emergency Department (HOSPITAL_COMMUNITY): Payer: Self-pay

## 2017-09-17 LAB — CBC WITH DIFFERENTIAL/PLATELET
Basophils Absolute: 0 10*3/uL (ref 0.0–0.1)
Basophils Relative: 0 %
EOS PCT: 5 %
Eosinophils Absolute: 0.6 10*3/uL (ref 0.0–0.7)
HCT: 43 % (ref 39.0–52.0)
Hemoglobin: 14.4 g/dL (ref 13.0–17.0)
LYMPHS ABS: 3.9 10*3/uL (ref 0.7–4.0)
LYMPHS PCT: 34 %
MCH: 28.4 pg (ref 26.0–34.0)
MCHC: 33.5 g/dL (ref 30.0–36.0)
MCV: 84.8 fL (ref 78.0–100.0)
MONO ABS: 0.8 10*3/uL (ref 0.1–1.0)
MONOS PCT: 7 %
NEUTROS ABS: 6.2 10*3/uL (ref 1.7–7.7)
Neutrophils Relative %: 54 %
PLATELETS: 318 10*3/uL (ref 150–400)
RBC: 5.07 MIL/uL (ref 4.22–5.81)
RDW: 14 % (ref 11.5–15.5)
WBC: 11.5 10*3/uL — ABNORMAL HIGH (ref 4.0–10.5)

## 2017-09-17 LAB — BASIC METABOLIC PANEL
Anion gap: 8 (ref 5–15)
BUN: 11 mg/dL (ref 6–20)
CHLORIDE: 109 mmol/L (ref 101–111)
CO2: 22 mmol/L (ref 22–32)
Calcium: 9.1 mg/dL (ref 8.9–10.3)
Creatinine, Ser: 1 mg/dL (ref 0.61–1.24)
GFR calc Af Amer: >60 mL/min (ref >60)
GLUCOSE: 114 mg/dL — AB (ref 65–99)
POTASSIUM: 3.6 mmol/L (ref 3.5–5.1)
Sodium: 139 mmol/L (ref 135–145)

## 2017-09-17 MED ORDER — ONDANSETRON HCL 4 MG PO TABS: 4.0000 mg | ORAL_TABLET | Freq: Three times a day (TID) | ORAL | 0 refills | Status: DC | PRN

## 2017-09-17 MED ORDER — TAMSULOSIN HCL 0.4 MG PO CAPS: ORAL_CAPSULE | ORAL | 0 refills | Status: DC

## 2017-09-17 MED ORDER — PERCOCET 5-325 MG PO TABS: 1.0000 | ORAL_TABLET | Freq: Four times a day (QID) | ORAL | 0 refills | Status: DC | PRN

## 2017-09-17 MED ORDER — FENTANYL CITRATE (PF) 100 MCG/2ML IJ SOLN
50.0000 ug | Freq: Once | INTRAMUSCULAR | Status: AC
  Administered 2017-09-17: 50 ug via INTRAVENOUS
  Filled 2017-09-17: qty 2

## 2017-09-17 MED ORDER — CEPHALEXIN 500 MG PO CAPS: 500.0000 mg | ORAL_CAPSULE | Freq: Three times a day (TID) | ORAL | 0 refills | Status: DC

## 2017-09-17 MED ORDER — CYCLOBENZAPRINE HCL 10 MG PO TABS
10.0000 mg | ORAL_TABLET | Freq: Once | ORAL | Status: AC
  Administered 2017-09-17: 10 mg via ORAL
  Filled 2017-09-17: qty 1

## 2017-09-17 NOTE — ED Provider Notes (Signed)
[facility]-EMERGENCY DEPT Provider Note   CSN: [id number] Arrival date & time: 09/16/17  2058  Time seen 23:45 PM    History   Chief Complaint Chief Complaint  Patient presents with  . Flank Pain    HPI Jacob Nixon. is a 27 y.o. male.  HPI patient has a history of kidney stones, he states he had to have lithotripsy once.  He states he has been having discomfort off and on for a few weeks however today about 8 PM while eating dinner he had acute onset of severe left flank pain that does not radiate.  The pain has been there constantly.  He states sometimes there is a worsening or a shooting pain present.  He states movement makes the pain worse, nothing makes it feel better.  He has had nausea without vomiting.  He noted that his urine was dark this morning and he thought maybe there was some blood in it.  He denies any fever.  He describes the pain as tightness and pressure.  He states it feels like when he had his kidney stones before.  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  [facility]  Past Medical History:  Diagnosis Date  . Bronchitis   . Cellulitis of right hand - RECURRENT   . Kidney stones   . Kidney stones     Patient Active Problem List   Diagnosis Date Noted  . Hyponatremia 10/26/2016  . Hypokalemia 10/26/2016  . Elevated glucose level   . Cellulitis of hand 08/04/2016  . Carpal tunnel syndrome on left 05/08/2016  . Abscess of upper arm   . Cellulitis 01/22/2016  . Leukocytosis 01/22/2016  . Neuropathy 01/22/2016  . Nephrolithiasis 01/22/2016  . Cellulitis of right hand   . Cellulitis of forearm, right 07/30/2015    Past Surgical History:  Procedure Laterality Date  . CARPAL TUNNEL RELEASE Left 04/09/2016   Procedure: CARPAL TUNNEL RELEASE;   Surgeon: Cammy Copa, MD;  Location: [facility] OR;  Service: Orthopedics;  Laterality: Left;  . HAND SURGERY    . KIDNEY STONE SURGERY     lithortripsy        Home Medications    Prior to Admission medications   Medication Sig Start Date End Date Taking? Authorizing Provider  albuterol (PROVENTIL HFA;VENTOLIN HFA) 108 (90 Base) MCG/ACT inhaler Inhale 1-2 puffs into the lungs every 6 (six) hours as needed for wheezing or shortness of breath. 07/21/17  Yes Wynetta Fines, MD  chlorpheniramine-HYDROcodone (TUSSIONEX PENNKINETIC ER) 10-8 MG/5ML SUER Take 5 mLs by mouth every 12 (twelve) hours as needed for cough. 07/21/17  Yes Wynetta Fines, MD  guaiFENesin (MUCINEX) 600 MG 12 hr tablet Take 600 mg by mouth 2 (two) times daily.   Yes [provider]  azithromycin (ZITHROMAX) 250 MG tablet Take 1 tablet (250 mg total) by mouth daily. Take first 2 tablets together, then 1 every day until finished. Patient not taking: Reported on 09/16/2017 07/21/17   Wynetta Fines, MD  cephALEXin (KEFLEX) 500 MG capsule Take 1 capsule (500 mg total) by mouth 3 (three) times daily. 09/17/17   Devoria Albe, MD  cyclobenzaprine (FLEXERIL) 10 MG tablet Take 1 tablet (10 mg total) by mouth 2 (two) times daily as needed for muscle  spasms. Patient not taking: Reported on 09/16/2017 05/22/17   Janne Napoleon, NP  diclofenac (VOLTAREN) 50 MG EC tablet Take 1 tablet (50 mg total) by mouth 2 (two) times daily. Patient not taking: Reported on 09/16/2017 05/22/17   Janne Napoleon, NP  ibuprofen (ADVIL,MOTRIN) 800 MG tablet Take 1 tablet (800 mg total) by mouth 3 (three) times daily. Patient not taking: Reported on 09/16/2017 12/29/16   Charlynne Pander, MD  ondansetron (ZOFRAN) 4 MG tablet Take 1 tablet (4 mg total) by mouth every 8 (eight) hours as needed for nausea or vomiting. 09/17/17   Devoria Albe, MD  PERCOCET 5-325 MG tablet Take 1 tablet by mouth every 6 (six) hours as needed for severe pain. 09/17/17   Devoria Albe, MD    tamsulosin (FLOMAX) 0.4 MG CAPS capsule Take 1 po QD until you pass the stone. 09/17/17   Devoria Albe, MD    Family History Family History  Problem Relation Age of Onset  . Hypertension Other   . Diabetes Father     Social History Social History   Tobacco Use  . Smoking status: Former Games developer  . Smokeless tobacco: Never Used  . Tobacco comment: States he "vapes"  Substance Use Topics  . Alcohol use: No    Comment: occasionally   . Drug use: No  employed Lives with spouse Drinks alcohol occassionally   Allergies   Coconut flavor and No known allergies   Review of Systems Review of Systems  All other systems reviewed and are negative.    Physical Exam Updated Vital Signs BP 103/68 (BP Location: Left Arm)   Pulse 81   Temp 98.3 F (36.8 C) (Oral)   Resp 17   Ht  (1.803 m)   Wt 117.9 kg (260 lb)   SpO2 93%   BMI 36.26 kg/m   Physical Exam  Constitutional: He is oriented to person, place, and time. He appears well-developed and well-nourished.  Non-toxic appearance. He does not appear ill. No distress.  Appears uncomfortable  HENT:  Head: Normocephalic and atraumatic.  Right Ear: External ear normal.  Left Ear: External ear normal.  Nose: Nose normal. No mucosal edema or rhinorrhea.  Mouth/Throat: Oropharynx is clear and moist and mucous membranes are normal. No dental abscesses or uvula swelling.  Patient sounds [name].  Eyes: Pupils are equal, round, and reactive to light. Conjunctivae and EOM are normal.  Neck: Normal range of motion and full passive range of motion without pain. Neck supple.  Cardiovascular: Normal rate, regular rhythm and normal heart sounds. Exam reveals no gallop and no friction rub.  No murmur heard. Pulmonary/Chest: Effort normal and breath sounds normal. No respiratory distress. He has no wheezes. He has no rhonchi. He has no rales. He exhibits no tenderness and no crepitus.  Abdominal: Soft. Normal appearance and bowel sounds  are normal. He exhibits no distension. There is no tenderness. There is no rebound and no guarding.  Genitourinary:  Genitourinary Comments: Patient indicates his pain is in his right flank, he has some CVA tenderness to percussion on the right.  There is no discomfort on the left.  Musculoskeletal: Normal range of motion. He exhibits no edema or tenderness.  Moves all extremities well.   Neurological: He is alert and oriented to person, place, and time. He has normal strength. No cranial nerve deficit.  Skin: Skin is warm, dry and intact. No rash noted. No erythema. No pallor.  Psychiatric: He has a normal mood and  affect. His speech is normal and behavior is normal. His mood appears not anxious.  Nursing note and vitals reviewed.    ED Treatments / Results  Labs (all labs ordered are listed, but only abnormal results are displayed) Results for orders placed or performed during the hospital encounter of 09/16/17  Urinalysis, Routine w reflex microscopic- may I&O cath if menses  Result Value Ref Range   Color, Urine YELLOW YELLOW   APPearance CLEAR CLEAR   Specific Gravity, Urine 1.023 1.005 - 1.030   pH 5.0 5.0 - 8.0   Glucose, UA NEGATIVE NEGATIVE mg/dL   Hgb urine dipstick MODERATE (A) NEGATIVE   Bilirubin Urine NEGATIVE NEGATIVE   Ketones, ur NEGATIVE NEGATIVE mg/dL   Protein, ur NEGATIVE NEGATIVE mg/dL   Nitrite NEGATIVE NEGATIVE   Leukocytes, UA NEGATIVE NEGATIVE   RBC / HPF 11-20 0 - 5 RBC/hpf   WBC, UA 6-10 0 - 5 WBC/hpf   Bacteria, UA RARE (A) NONE SEEN   Mucus PRESENT    Hyaline Casts, UA PRESENT    Ca Oxalate Crys, UA PRESENT   Basic metabolic panel  Result Value Ref Range   Sodium 139 135 - 145 mmol/L   Potassium 3.6 3.5 - 5.1 mmol/L   Chloride 109 101 - 111 mmol/L   CO2 22 22 - 32 mmol/L   Glucose, Bld 114 (H) 65 - 99 mg/dL   BUN 11 6 - 20 mg/dL   Creatinine, Ser 6.57 0.61 - 1.24 mg/dL   Calcium 9.1 8.9 - 84.6 mg/dL   GFR calc non Af Amer >60 >60 mL/min    GFR calc Af Amer >60 >60 mL/min   Anion gap 8 5 - 15  CBC with Differential  Result Value Ref Range   WBC 11.5 (H) 4.0 - 10.5 K/uL   RBC 5.07 4.22 - 5.81 MIL/uL   Hemoglobin 14.4 13.0 - 17.0 g/dL   HCT 96.2 95.2 - 84.1 %   MCV 84.8 78.0 - 100.0 fL   MCH 28.4 26.0 - 34.0 pg   MCHC 33.5 30.0 - 36.0 g/dL   RDW 32.4 40.1 - 02.7 %   Platelets 318 150 - 400 K/uL   Neutrophils Relative % 54 %   Neutro Abs 6.2 1.7 - 7.7 K/uL   Lymphocytes Relative 34 %   Lymphs Abs 3.9 0.7 - 4.0 K/uL   Monocytes Relative 7 %   Monocytes Absolute 0.8 0.1 - 1.0 K/uL   Eosinophils Relative 5 %   Eosinophils Absolute 0.6 0.0 - 0.7 K/uL   Basophils Relative 0 %   Basophils Absolute 0.0 0.0 - 0.1 K/uL   Laboratory interpretation all normal except hematuria and possible UTI, leukocytosis    EKG None  Radiology US Renal  Result Date: 09/17/2017 CLINICAL DATA:  Left flank pain.  History of kidney stones. EXAM: RENAL / URINARY TRACT ULTRASOUND COMPLETE COMPARISON:  CT abdomen and pelvis 12/29/2016 FINDINGS: Right Kidney: Length: 11.3 cm. Echogenicity within normal limits. No mass or hydronephrosis visualized. Left Kidney: Length: 11.9 cm. Echogenicity within normal limits. No mass or hydronephrosis visualized. Bladder: Appears normal for degree of bladder distention. Examination is technically limited due to patient's body habitus. IMPRESSION: Normal ultrasound appearance of the kidneys and bladder. No hydronephrosis. Electronically Signed   By: Burman Nieves M.D.   On: 09/17/2017 00:41    Ct Renal Stone Study  Result Date: 09/17/2017 CLINICAL DATA:  Left flank pain for 1 day. Microhematuria. History of previous kidney stones. EXAM: CT ABDOMEN  AND PELVIS WITHOUT CONTRAST TECHNIQUE: Multidetector CT imaging of the abdomen and pelvis was performed following the standard protocol without IV contrast. COMPARISON:  12/29/2016 FINDINGS: Lower chest: Mild dependent changes in the lung bases. Hepatobiliary: No focal  liver abnormality is seen. No gallstones, gallbladder wall thickening, or biliary dilatation. Pancreas: Unremarkable. No pancreatic ductal dilatation or surrounding inflammatory changes. Spleen: Normal in size without focal abnormality. Adrenals/Urinary Tract: 2 mm stone demonstrated in the proximal left ureter just distal to the ureteropelvic junction. Tiny punctate calcifications are demonstrated in both kidneys. No hydronephrosis or hydroureter. Bladder wall is not thickened. No bladder stones. No adrenal gland nodules. Stomach/Bowel: Stomach, small bowel, and colon are not abnormally distended. Scattered stool throughout the colon. A tiny appendicoliths is present in the mid to distal appendix, measuring 1.5 mm. Normal appendiceal diameter and wall thickness. No evidence of appendicitis. Vascular/Lymphatic: No significant vascular findings are present. No enlarged abdominal or pelvic lymph nodes. Reproductive: Prostate is unremarkable. Other: No abdominal wall hernia or abnormality. No abdominopelvic ascites. Musculoskeletal: No acute or significant osseous findings. IMPRESSION: 1. 2 mm stone in the proximal left ureter without significant obstruction. Additional tiny punctate nonobstructing intrarenal stones bilaterally. 2. An appendicolith is present but no evidence of appendicitis. Electronically Signed   By: Burman Nieves M.D.   On: 09/17/2017 03:25     Procedures Procedures (including critical care time)  Medications Ordered in ED Medications  ondansetron (ZOFRAN) injection 4 mg (4 mg Intravenous Given 09/17/17 0009)  ketorolac (TORADOL) 30 MG/ML injection 30 mg (30 mg Intravenous Given 09/17/17 0009)  cefTRIAXone (ROCEPHIN) 1 g in sodium chloride 0.9 % 100 mL IVPB (0 g Intravenous Stopped 09/17/17 0141)  oxymetazoline (AFRIN) 0.05 % nasal spray 1 spray (1 spray Each Nare Given 09/17/17 0009)  cyclobenzaprine (FLEXERIL) tablet 10 mg (10 mg Oral Given 09/17/17 0206)  fentaNYL (SUBLIMAZE) injection 50  mcg (50 mcg Intravenous Given 09/17/17 0255)     Initial Impression / Assessment and Plan / ED Course  I have reviewed the triage vital signs and the nursing notes.  Pertinent labs & imaging results that were available during my care of the patient were reviewed by me and considered in my medical decision making (see chart for details).     Patient asked several times for a nasal spray due to nasal stuffiness.  He was given Afrin.  He was given Toradol IV for his pain.  I reviewed patient's past several CT scans, he has had about 5 in our system.  In 2016 he had a 9 mm stone that he required lithotripsy for, and in August 2018 he had a 2 mm proximal stone that he does not recall if he passed or not.  Ultrasound was done due to risk of radiation with another CT scan.  On review of his urinalysis it is suggestive of a UTI with increased white blood cells and bacteria, he was given Rocephin IV.  Recheck at 2:40 AM patient states she still having discomfort.  He states it feels like prior kidney stones.  At this point CT of the abdomen pelvis was done, I have tried to not do it due to the radiation exposure however he is having continued pain.  He was given IV fentanyl.  He states that Toradol took the edge off the pain but he still has significant pain.  Recheck at 5:30 AM patient states his pain is better.  We discussed his CT results and need to follow-up with urology.  He was  discharged home with antibiotics.  We discussed that he did have a appendicolith and if he ever gets pain in his right lower quadrant that he should be concerned for appendicitis.   Review of the [place] shows patient got 12 oxycodone 5/325 in August 2018 and hydrocodone cough syrup on March 4.  Final Clinical Impressions(s) / ED Diagnoses   Final diagnoses:  Urinary tract infection with hematuria, site unspecified  Right ureteral stone    ED Discharge Orders        Ordered    tamsulosin (FLOMAX) 0.4  MG CAPS capsule     09/17/17 0546    PERCOCET 5-325 MG tablet  Every 6 hours PRN     09/17/17 0546    ondansetron (ZOFRAN) 4 MG tablet  Every 8 hours PRN     09/17/17 0546    cephALEXin (KEFLEX) 500 MG capsule  3 times daily     09/17/17 0547     Plan discharge  Devoria Albe, MD, Concha Pyo, MD 09/17/17 [phone]

## 2017-09-17 NOTE — ED Notes (Signed)
Iv pulled per provider order  Provider to revisit pt

## 2017-09-17 NOTE — Discharge Instructions (Signed)
Drink plenty of fluids. Take the medications as prescribed. Take the antibiotics until gone.  Return to the ED if you get fever or have uncontrolled vomiting or pain. Please call [facility] to have them recheck you in the office.

## 2017-09-17 NOTE — ED Notes (Signed)
Pt has a urinal at bedside and is aware a urine culture is needed. Pt stated he would notify staff when a urine specimen was available.

## 2017-09-18 LAB — URINE CULTURE
Culture: NO GROWTH
SPECIAL REQUESTS: NORMAL

## 2017-10-19 ENCOUNTER — Encounter (HOSPITAL_COMMUNITY): Payer: Self-pay | Admitting: *Deleted

## 2017-10-19 ENCOUNTER — Emergency Department (HOSPITAL_COMMUNITY): Payer: Self-pay

## 2017-10-19 ENCOUNTER — Emergency Department (HOSPITAL_COMMUNITY)
Admission: EM | Admit: 2017-10-19 | Discharge: 2017-10-19 | Disposition: A | Discharge status: Home / Self Care | Payer: Self-pay | Attending: Emergency Medicine | Admitting: Emergency Medicine

## 2017-10-19 DIAGNOSIS — Z79899 Other long term (current) drug therapy: Secondary | ICD-10-CM | POA: Insufficient documentation

## 2017-10-19 DIAGNOSIS — R319 Hematuria, unspecified: Secondary | ICD-10-CM

## 2017-10-19 DIAGNOSIS — L03113 Cellulitis of right upper limb: Secondary | ICD-10-CM

## 2017-10-19 DIAGNOSIS — Z87891 Personal history of nicotine dependence: Secondary | ICD-10-CM | POA: Insufficient documentation

## 2017-10-19 LAB — CBC
HCT: 42.6 % (ref 39.0–52.0)
Hemoglobin: 14.4 g/dL (ref 13.0–17.0)
MCH: 28.8 pg (ref 26.0–34.0)
MCHC: 33.8 g/dL (ref 30.0–36.0)
MCV: 85.2 fL (ref 78.0–100.0)
PLATELETS: 352 10*3/uL (ref 150–400)
RBC: 5 MIL/uL (ref 4.22–5.81)
RDW: 13.9 % (ref 11.5–15.5)
WBC: 10 10*3/uL (ref 4.0–10.5)

## 2017-10-19 LAB — URINALYSIS, ROUTINE W REFLEX MICROSCOPIC
BILIRUBIN URINE: NEGATIVE
Bacteria, UA: NONE SEEN
GLUCOSE, UA: NEGATIVE mg/dL
Ketones, ur: NEGATIVE mg/dL
Leukocytes, UA: NEGATIVE
NITRITE: NEGATIVE
PH: 5 (ref 5.0–8.0)
Protein, ur: NEGATIVE mg/dL
Specific Gravity, Urine: 1.017 (ref 1.005–1.030)

## 2017-10-19 LAB — COMPREHENSIVE METABOLIC PANEL
ALK PHOS: 69 U/L (ref 38–126)
ALT: 24 U/L (ref 17–63)
ANION GAP: 10 (ref 5–15)
AST: 18 U/L (ref 15–41)
Albumin: 3.6 g/dL (ref 3.5–5.0)
BUN: 9 mg/dL (ref 6–20)
CALCIUM: 9 mg/dL (ref 8.9–10.3)
CO2: 21 mmol/L — AB (ref 22–32)
Chloride: 106 mmol/L (ref 101–111)
Creatinine, Ser: 0.83 mg/dL (ref 0.61–1.24)
GFR calc non Af Amer: >60 mL/min (ref >60)
Glucose, Bld: 96 mg/dL (ref 65–99)
POTASSIUM: 4 mmol/L (ref 3.5–5.1)
Sodium: 137 mmol/L (ref 135–145)
TOTAL PROTEIN: 7.9 g/dL (ref 6.5–8.1)
Total Bilirubin: 0.7 mg/dL (ref 0.3–1.2)

## 2017-10-19 LAB — LIPASE, BLOOD: LIPASE: 27 U/L (ref 11–51)

## 2017-10-19 MED ORDER — MORPHINE SULFATE (PF) 4 MG/ML IV SOLN
4.0000 mg | Freq: Once | INTRAVENOUS | Status: AC
  Administered 2017-10-19: 4 mg via INTRAVENOUS
  Filled 2017-10-19: qty 1

## 2017-10-19 MED ORDER — CEPHALEXIN 500 MG PO CAPS: 500.0000 mg | ORAL_CAPSULE | Freq: Four times a day (QID) | ORAL | 0 refills | Status: DC

## 2017-10-19 MED ORDER — ONDANSETRON HCL 4 MG/2ML IJ SOLN
4.0000 mg | Freq: Once | INTRAMUSCULAR | Status: AC
  Administered 2017-10-19: 4 mg via INTRAVENOUS
  Filled 2017-10-19: qty 2

## 2017-10-19 MED ORDER — SODIUM CHLORIDE 0.9 % IV BOLUS
1000.0000 mL | Freq: Once | INTRAVENOUS | Status: AC
  Administered 2017-10-19: 1000 mL via INTRAVENOUS

## 2017-10-19 MED ORDER — SULFAMETHOXAZOLE-TRIMETHOPRIM 800-160 MG PO TABS: 1.0000 | ORAL_TABLET | Freq: Two times a day (BID) | ORAL | 0 refills | Status: DC

## 2017-10-19 MED ORDER — CEFAZOLIN SODIUM-DEXTROSE 1-4 GM/50ML-% IV SOLN
1.0000 g | Freq: Once | INTRAVENOUS | Status: AC
  Administered 2017-10-19: 1 g via INTRAVENOUS
  Filled 2017-10-19: qty 50

## 2017-10-19 MED ORDER — NAPROXEN 500 MG PO TABS: 500.0000 mg | ORAL_TABLET | Freq: Two times a day (BID) | ORAL | 0 refills | Status: DC

## 2017-10-19 NOTE — ED Notes (Signed)
IV removed from L forearm

## 2017-10-19 NOTE — ED Notes (Signed)
US at bedside

## 2017-10-19 NOTE — ED Notes (Signed)
PA made aware patient is requesting additional pain medicine.

## 2017-10-19 NOTE — Discharge Instructions (Addendum)
Your urine showed that you have blood in your urine but did not show any signs of infection.  Your ultrasound did not show signs of kidney stone.  Repeat abdominal exam is without any tenderness.  We had a discussion about CT versus not having CT at this time and we elected not to undergo CT.  If you develop any fever, focal abdominal pain, increasing pain, nausea or vomiting or any other new concerning symptoms please return for further evaluation.  Please take all of your antibiotics until finished!   You may develop abdominal discomfort or diarrhea from the antibiotic.  You may help offset this with probiotics which you can buy or get in yogurt. Do not eat  or take the probiotics until 2 hours after your antibiotic.   Follow up with your PCP this week for recheck. If you develop worsening or new concerning symptoms you can return to the emergency department for re-evaluation. See below for additional return precautions.   Cellulitis is a skin infection. The infected area is usually red and sore. This condition occurs most often in the arms and lower legs. It is very important to get treated for this condition. Follow these instructions at home: Take over-the-counter and prescription medicines only as told by your doctor. If you were prescribed an antibiotic medicine, take it as told by your doctor. Do not stop taking the antibiotic even if you start to feel better. Drink enough fluid to keep your pee (urine) clear or pale yellow. Do not touch or rub the infected area. Raise (elevate) the infected area above the level of your heart while you are sitting or lying down. Place warm or cold wet cloths (warm or cold compresses) on the infected area. Do this as told by your doctor. Keep all follow-up visits as told by your doctor. This is important. These visits let your doctor make sure your infection is not getting worse. Contact a doctor if: You have a fever. Your symptoms do not get better after 1-2  days of treatment. Your bone or joint under the infected area starts to hurt after the skin has healed. Your infection comes back. This can happen in the same area or another area. You have a swollen bump in the infected area. You have new symptoms. You feel ill and also have muscle aches and pains. Get help right away if: Your symptoms get worse. You feel very sleepy. You throw up (vomit) or have watery poop (diarrhea) for a long time. There are red streaks coming from the infected area. Your red area gets larger. Your red area turns darker.

## 2017-10-19 NOTE — ED Provider Notes (Signed)
[facility]-EMERGENCY DEPT Provider Note   CSN: [id number] Arrival date & time: 10/19/17  1554     History   Chief Complaint Chief Complaint  Patient presents with  . Arm Swelling    right  . Abdominal Pain    HPI Jacob Nixon. is a 27 y.o. male history of recurrent cellulitis of the right hand/forearm, kidney stones who presents emergency department today for left flank pain as well as redness of his right forearm/elbow.  Patient was seen here on 4/30 for left flank pain with radiation to the left abdomen and was found to have a 2 mm stone of the proximal left ureter without evidence of obstruction.  He was discharged home on pain medication as well as antibiotics.  He notes that he passed a stone and was asymptomatic for several weeks.  Patient states that several days ago he awoke and had pain in his left flank that radiated to his left mid abdomen with associated hematuria as well as urinary urgency there is similar to his prior pain.  Patient CT scan that shows several, multiple punctate intrarenal stones.  He denies any fever, chills, chest pain, shortness of breath, cough, hemoptysis, nausea/vomiting/diarrhea, dysuria, urinary frequency.  He has been taking over-the-counter medication for symptoms without any relief.  No prior abdominal surgeries.  Normal bowel movement today.  No melena or hematochezia.  Patient reports over the last several days she has had redness as well as heat to his left upper forearm as well as left elbow.  He notes normal range of motion and no swelling over the joint.  He denies any IV drug use.  He had a recurrent problem with cellulitis of the right hand/lower forearm several years ago.  He has had extensive studies done with Doppler study, MRI, CT and x-rays that were all reassuring.  He was seen by rheumatology with a negative rheumatology panel.  He is followed by infectious disease as well as orthopedics for this.  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  After he quit his job and his symptoms resolved when he started working for [employer].  He notes that 1 week ago history detailing occurs again prior to the onset of his symptoms.  He denies any trauma to the area.  No fever, chills, nausea/vomiting, IV drug use, history of immunocompromise, drainage, preceding injury/open wounds, numbness/tingling/weakness.  HPI  Past Medical History:  Diagnosis Date  . Bronchitis   . Cellulitis of right hand - RECURRENT   . Kidney stones   . Kidney stones     Patient Active Problem List   Diagnosis Date Noted  . Hyponatremia 10/26/2016  . Hypokalemia 10/26/2016  . Elevated glucose level   . Cellulitis of hand 08/04/2016  . Carpal tunnel syndrome on left 05/08/2016  . Abscess of upper arm   . Cellulitis 01/22/2016  . Leukocytosis 01/22/2016  . Neuropathy 01/22/2016  . Nephrolithiasis 01/22/2016  . Cellulitis of right hand   . Cellulitis of forearm, right 07/30/2015    Past Surgical History:  Procedure Laterality Date  . CARPAL TUNNEL RELEASE Left 04/09/2016   Procedure: CARPAL TUNNEL RELEASE;  Surgeon: Cammy Copa, MD;  Location: [facility] OR;  Service: Orthopedics;  Laterality: Left;  . HAND SURGERY    . KIDNEY STONE SURGERY     lithortripsy        Home Medications    Prior to Admission medications   Medication Sig Start Date  End Date Taking? Authorizing Provider  albuterol (PROVENTIL HFA;VENTOLIN HFA) 108 (90 Base) MCG/ACT inhaler Inhale 1-2 puffs into the lungs every 6 (six) hours as needed for wheezing or shortness of breath. 07/21/17  Yes Wynetta FinesMessick, Peter C, MD  cetirizine (ZYRTEC) 10 MG tablet Take 10 mg by mouth daily.   Yes [provider]  azithromycin (ZITHROMAX) 250 MG tablet Take 1 tablet (250 mg total) by mouth daily. Take first 2 tablets together, then 1 every day until finished. Patient not taking: Reported on 09/16/2017 07/21/17   Wynetta FinesMessick, Peter C, MD  cephALEXin  (KEFLEX) 500 MG capsule Take 1 capsule (500 mg total) by mouth 3 (three) times daily. Patient not taking: Reported on 10/19/2017 09/17/17   Devoria AlbeKnapp, Iva, MD  chlorpheniramine-HYDROcodone [facility](TUSSIONEX PENNKINETIC ER) 10-8 MG/5ML SUER Take 5 mLs by mouth every 12 (twelve) hours as needed for cough. Patient not taking: Reported on 10/19/2017 07/21/17   Wynetta FinesMessick, Peter C, MD  cyclobenzaprine (FLEXERIL) 10 MG tablet Take 1 tablet (10 mg total) by mouth 2 (two) times daily as needed for muscle spasms. Patient not taking: Reported on 09/16/2017 05/22/17   Janne NapoleonNeese, Hope M, NP  diclofenac (VOLTAREN) 50 MG EC tablet Take 1 tablet (50 mg total) by mouth 2 (two) times daily. Patient not taking: Reported on 09/16/2017 05/22/17   Janne NapoleonNeese, Hope M, NP  guaiFENesin (MUCINEX) 600 MG 12 hr tablet Take 600 mg by mouth 2 (two) times daily.    [provider]  ibuprofen (ADVIL,MOTRIN) 800 MG tablet Take 1 tablet (800 mg total) by mouth 3 (three) times daily. Patient not taking: Reported on 09/16/2017 12/29/16   Charlynne PanderYao, David Hsienta, MD  ondansetron (ZOFRAN) 4 MG tablet Take 1 tablet (4 mg total) by mouth every 8 (eight) hours as needed for nausea or vomiting. Patient not taking: Reported on 10/19/2017 09/17/17   Devoria AlbeKnapp, Iva, MD  PERCOCET 5-325 MG tablet Take 1 tablet by mouth every 6 (six) hours as needed for severe pain. Patient not taking: Reported on 10/19/2017 09/17/17   Devoria AlbeKnapp, Iva, MD  tamsulosin (FLOMAX) 0.4 MG CAPS capsule Take 1 po QD until you pass the stone. Patient not taking: Reported on 10/19/2017 09/17/17   Devoria AlbeKnapp, Iva, MD    Family History Family History  Problem Relation Age of Onset  . Hypertension Other   . Diabetes Father     Social History Social History   Tobacco Use  . Smoking status: Former Games developermoker  . Smokeless tobacco: Never Used  . Tobacco comment: States he "vapes"  Substance Use Topics  . Alcohol use: No    Comment: occasionally   . Drug use: No     Allergies   Coconut flavor   Review of  Systems Review of Systems  All other systems reviewed and are negative.    Physical Exam Updated Vital Signs BP 127/89 (BP Location: Left Arm)   Pulse 91   Temp 97.8 F (36.6 C) (Oral)   Resp 18   SpO2 100%   Physical Exam  Constitutional: He appears well-developed and well-nourished.  HENT:  Head: Normocephalic and atraumatic.  Right Ear: External ear normal.  Left Ear: External ear normal.  Nose: Nose normal.  Mouth/Throat: Uvula is midline, oropharynx is clear and moist and mucous membranes are normal. No tonsillar exudate.  Eyes: Pupils are equal, round, and reactive to light. Right eye exhibits no discharge. Left eye exhibits no discharge. No scleral icterus.  Neck: Trachea normal. Neck supple. No spinous process tenderness present. No neck  rigidity. Normal range of motion present.  Cardiovascular: Normal rate, regular rhythm and intact distal pulses.  No murmur heard. Pulses:      Radial pulses are 2+ on the right side, and 2+ on the left side.       Dorsalis pedis pulses are 2+ on the right side, and 2+ on the left side.       Posterior tibial pulses are 2+ on the right side, and 2+ on the left side.  No lower extremity swelling or edema. Calves symmetric in size bilaterally.  Pulmonary/Chest: Effort normal and breath sounds normal. He exhibits no tenderness.  Abdominal: Soft. Bowel sounds are normal. He exhibits no distension. There is no tenderness. There is no rigidity, no rebound, no guarding, no CVA tenderness, no tenderness at McBurney's point and negative Murphy's sign.  Musculoskeletal: He exhibits no edema.       Right wrist: Normal.       Right hand: Normal. Normal sensation noted. Normal strength noted.  Patient with redness that wraps around the patient dorsal upper arm, across elbow, to the proximal aspect of ulnar forearm. Area is warm to the touch. No fluctance, induration, or drainage. No joint swelling. Normal ROM of the joint without pain. TTP over  erythema is not out of proportion.  Compartments are soft above and below area of redness.  He is neurovascular intact.  Lymphadenopathy:    He has no cervical adenopathy.  Neurological: He is alert. He has normal strength. No sensory deficit.  Skin: Skin is warm and dry. No rash noted. He is not diaphoretic. There is erythema.  No vesicular-like rash on the patient's left trunk or abdomen  Psychiatric: He has a normal mood and affect.  Nursing note and vitals reviewed.      ED Treatments / Results  Labs (all labs ordered are listed, but only abnormal results are displayed) Labs Reviewed  COMPREHENSIVE METABOLIC PANEL - Abnormal; Notable for the following components:      Result Value   CO2 21 (*)    All other components within normal limits  URINALYSIS, ROUTINE W REFLEX MICROSCOPIC - Abnormal; Notable for the following components:   Hgb urine dipstick SMALL (*)    All other components within normal limits  LIPASE, BLOOD  CBC    EKG None  Radiology US Renal  Result Date: 10/19/2017 CLINICAL DATA:  Left flank pain. Hematuria. History of kidney stones. EXAM: RENAL / URINARY TRACT ULTRASOUND COMPLETE COMPARISON:  CT and ultrasound 09/17/2017 FINDINGS: Right Kidney: Length: 12.1 cm. Echogenicity within normal limits. Tiny stones on prior CT are not visualized sonographically. No mass or hydronephrosis visualized. Left Kidney: Length: 12.0 cm. Echogenicity within normal limits. Tiny stones on prior CT are not visualized sonographically. No mass or hydronephrosis visualized. Bladder: Appears normal for degree of bladder distention. IMPRESSION: No hydronephrosis. Tiny stones on prior CT are not visualized sonographically. Electronically Signed   By: Rubye Oaks M.D.   On: 10/19/2017 20:48    Procedures Procedures (including critical care time)  Medications Ordered in ED Medications  sodium chloride 0.9 % bolus 1,000 mL (0 mLs Intravenous Stopped 10/19/17 2042)  ondansetron  (ZOFRAN) injection 4 mg (4 mg Intravenous Given 10/19/17 1919)  morphine 4 MG/ML injection 4 mg (4 mg Intravenous Given 10/19/17 1918)  ceFAZolin (ANCEF) IVPB 1 g/50 mL premix (0 g Intravenous Stopped 10/19/17 1957)  morphine 4 MG/ML injection 4 mg (4 mg Intravenous Given 10/19/17 2111)     Initial Impression / Assessment  and Plan / ED Course  I have reviewed the triage vital signs and the nursing notes.  Pertinent labs & imaging results that were available during my care of the patient were reviewed by me and considered in my medical decision making (see chart for details).     27 y.o. Male history of kidney stones who presents emergency department today for left flank pain as well as hematuria.  She was seen here on 4/30 for left flank pain with radiation to the left abdomen.  On CT scan is found to have a 2 mm stone as well as multiple small renal calculi.  He notes that he passed the stone several weeks ago.  He notes however that a few days ago he started developing similar pain in the left flank with radiation into his left abdomen with associated hematuria.  He denies any other associated symptoms.  Patient without CVA tenderness.  Abdominal tenderness peritoneal signs.  Vital signs are reassuring.  He is non-ill and nonseptic appearing.  He is not writhing in pain.  No leukocytosis.  Kidney function within normal limits.  UA without evidence of UTI.  Renal ultrasound without evidence of hydronephrosis.  Repeat abdominal exam without any tenderness palpation or peritoneal signs.  We discussed risk versus benefits of CT scan to evaluate.  He elected to not receive CT scan at this current time. Return precautions were discussed. He is in agreement with plan and he is currently without pain.   Patient with recurrent cellulitis of the right hand/forearm reports over the last several days she has had redness as well as heat to his left upper forearm as well as left elbow.  He has had extensive studies done  with Doppler study, MRI, CT and x-rays that were all reassuring.  He was seen by rheumatology with a negative rheumatology panel.  He is followed by infectious disease as well as orthopedics for this.  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  He notes that 1 week ago history detailing occurs again prior to the onset of his symptoms. No angioedema, difficulty breathing or shortness of breath.  She is afebrile without leukocytosis, joint swelling and has normal range of motion.  I have low suspicion for septic joint at this time.  Patient does have a regular shaped erythema and heat over the distal aspect of proximal forearm as well as proximal aspect of distal forearm seen in the picture above.  There is no fluctuance or focal abscess noted.  He denies injury.  There is no open wounds.  He is neurovascular intact distally.  He denies any IV drug use. Radial pulse 2+. Do not feel patient needs additional imaging at this time given reassuring workups in the past. Suspect uncomplicated cellulitis based on limited area of involvement, minimal pain and no systemic signs of illness (eg, fever, chills, dehydration, altered mental status, tachypnea, tachycardia, hypotension), no risk factors for serious illness (eg, extremes of age, general debility, immunocompromised status, history of diabetes (glucose 96 today], risk factors of HIV, recent use of steroids or other immunosuppressive medications). PE reveals redness, swelling, mildly tender, warm to touch. The skin intact. There is no bleeding, bullae or drainage. The area is non purulent and non circumferential.  Borders are not elevated or sharply demarcated.  No occult abscess for which I&D would be possible. Area of infection marked and patient  encourage to return if redness begins to streak, extend beyond the markings, and/or fever or nausea/vomiting are to  develop. He was given IV abx in the department. Will prescribed oral Keflex and Bactrim. I advised the patient to follow-up with their primary care provider this week for recheck otherwise. Strict return precautions discussed (as above). Also informed patient they can return to the emergency department with new or worsening symptoms or new concerns. The patient verbalized understanding and is in agreement with plan. Patient appears reliable for follow up and is agreeable to discharge. At time of discharge the patient is alert, oriented, in NAD, afebrile, non-tachycardic, non-septic and non-toxic appearing. Stable for discharge.   Patient case seen and discussed with Dr. Denton Lank who is in agreement with plan.   Final Clinical Impressions(s) / ED Diagnoses   Final diagnoses:  Hematuria, unspecified type  Cellulitis of right upper extremity    ED Discharge Orders        Ordered    cephALEXin (KEFLEX) 500 MG capsule  4 times daily     10/19/17 2207    sulfamethoxazole-trimethoprim (BACTRIM DS,SEPTRA DS) 800-160 MG tablet  2 times daily     10/19/17 2207    naproxen (NAPROSYN) 500 MG tablet  2 times daily     10/19/17 2208       Princella Pellegrini 10/19/17 2311    Cathren Laine, MD 10/28/17 1102

## 2017-10-19 NOTE — ED Triage Notes (Signed)
Pt complains of left lower abdominal pain and right arm swelling. Pt states he has hx of cellulitis in his right arm.

## 2017-12-16 ENCOUNTER — Encounter (HOSPITAL_COMMUNITY): Payer: Self-pay

## 2017-12-16 ENCOUNTER — Emergency Department (HOSPITAL_COMMUNITY)
Admission: EM | Admit: 2017-12-16 | Discharge: 2017-12-17 | Disposition: A | Discharge status: Home / Self Care | Payer: Self-pay | Attending: Emergency Medicine | Admitting: Emergency Medicine

## 2017-12-16 DIAGNOSIS — R1031 Right lower quadrant pain: Secondary | ICD-10-CM | POA: Insufficient documentation

## 2017-12-16 DIAGNOSIS — Z87891 Personal history of nicotine dependence: Secondary | ICD-10-CM | POA: Insufficient documentation

## 2017-12-16 LAB — COMPREHENSIVE METABOLIC PANEL
ALBUMIN: 3.7 g/dL (ref 3.5–5.0)
ALK PHOS: 77 U/L (ref 38–126)
ALT: 26 U/L (ref 0–44)
AST: 25 U/L (ref 15–41)
Anion gap: 7 (ref 5–15)
BUN: 10 mg/dL (ref 6–20)
CALCIUM: 9.2 mg/dL (ref 8.9–10.3)
CO2: 26 mmol/L (ref 22–32)
CREATININE: 0.98 mg/dL (ref 0.61–1.24)
Chloride: 108 mmol/L (ref 98–111)
GFR calc Af Amer: >60 mL/min (ref >60)
GFR calc non Af Amer: >60 mL/min (ref >60)
GLUCOSE: 95 mg/dL (ref 70–99)
Potassium: 4 mmol/L (ref 3.5–5.1)
SODIUM: 141 mmol/L (ref 135–145)
Total Bilirubin: 0.6 mg/dL (ref 0.3–1.2)
Total Protein: 7.5 g/dL (ref 6.5–8.1)

## 2017-12-16 LAB — CBC
HCT: 43 % (ref 39.0–52.0)
Hemoglobin: 14.7 g/dL (ref 13.0–17.0)
MCH: 29.3 pg (ref 26.0–34.0)
MCHC: 34.2 g/dL (ref 30.0–36.0)
MCV: 85.8 fL (ref 78.0–100.0)
PLATELETS: 320 10*3/uL (ref 150–400)
RBC: 5.01 MIL/uL (ref 4.22–5.81)
RDW: 13.9 % (ref 11.5–15.5)
WBC: 9.7 10*3/uL (ref 4.0–10.5)

## 2017-12-16 LAB — LIPASE, BLOOD: Lipase: 24 U/L (ref 11–51)

## 2017-12-16 NOTE — ED Triage Notes (Signed)
Pt presents with c/o right side flank pain. Pt reports last time he was here, he was diagnosed with a kidney stone but they also spotted a stone in his appendix, unknown exactly what it was. Pt reports he is now having pain in the right lower abdominal area around to the right flank.

## 2017-12-17 ENCOUNTER — Emergency Department (HOSPITAL_COMMUNITY): Payer: Self-pay

## 2017-12-17 LAB — URINALYSIS, ROUTINE W REFLEX MICROSCOPIC
BILIRUBIN URINE: NEGATIVE
Glucose, UA: NEGATIVE mg/dL
Hgb urine dipstick: NEGATIVE
Ketones, ur: NEGATIVE mg/dL
Leukocytes, UA: NEGATIVE
NITRITE: NEGATIVE
PH: 6 (ref 5.0–8.0)
Protein, ur: NEGATIVE mg/dL
SPECIFIC GRAVITY, URINE: 1.02 (ref 1.005–1.030)

## 2017-12-17 MED ORDER — KETOROLAC TROMETHAMINE 15 MG/ML IJ SOLN
15.0000 mg | Freq: Once | INTRAMUSCULAR | Status: AC
  Administered 2017-12-17: 15 mg via INTRAVENOUS
  Filled 2017-12-17: qty 1

## 2017-12-17 MED ORDER — ONDANSETRON 4 MG PO TBDP: 4.0000 mg | ORAL_TABLET | Freq: Three times a day (TID) | ORAL | 0 refills | Status: DC | PRN

## 2017-12-17 MED ORDER — MORPHINE SULFATE (PF) 4 MG/ML IV SOLN
4.0000 mg | Freq: Once | INTRAVENOUS | Status: AC
  Administered 2017-12-17: 4 mg via INTRAVENOUS
  Filled 2017-12-17: qty 1

## 2017-12-17 MED ORDER — ONDANSETRON HCL 4 MG/2ML IJ SOLN
4.0000 mg | Freq: Once | INTRAMUSCULAR | Status: AC
  Administered 2017-12-17: 4 mg via INTRAVENOUS
  Filled 2017-12-17: qty 2

## 2017-12-17 NOTE — ED Provider Notes (Signed)
[facility]-EMERGENCY DEPT Provider Note   CSN: [id number] Arrival date & time: 12/16/17  1827     History   Chief Complaint Chief Complaint  Patient presents with  . Flank Pain  . Abdominal Pain    HPI Jacob Grisby. is a 27 y.o. male.  HPI 27 year old African-American male past medical history significant for kidney stones presents to the emergency department today for evaluation of right lower quadrant abdominal pain and right flank pain.  Patient reports associated nausea and emesis.  Patient reports associated hematuria.  Patient states that his symptoms have been ongoing for the past week and progressively worsened.  States that his pain was initially intermittent but now has become constant.  Describes the pain is cramping in nature but with intermittent sharpness.  Patient did take hydrocodone 2 days ago for his pain with some relief.  Patient denies any penile discharge.  Does report some testicular pain at times but currently denies any other any testicular swelling.  Patient denies any change in his bowel habits.  Denies any associated fevers or chills.  He did not take anything for her symptoms today.  Patient has known kidney stones.  He also states he has a "stone in his appendix" and that he is concerned he may have an appendicitis today.  Palpation makes the pain worse.  Nothing makes the pain better.  Pt denies any fever, chill, ha, vision changes, lightheadedness, dizziness, congestion, neck pain, cp, sob, cough,change in bowel habits, melena, hematochezia, lower extremity paresthesias.  Past Medical History:  Diagnosis Date  . Bronchitis   . Cellulitis of right hand - RECURRENT   . Kidney stones   . Kidney stones     Patient Active Problem List   Diagnosis Date Noted  . Hyponatremia 10/26/2016  . Hypokalemia 10/26/2016  . Elevated glucose level   . Cellulitis of hand 08/04/2016  . Carpal tunnel syndrome on left 05/08/2016  . Abscess  of upper arm   . Cellulitis 01/22/2016  . Leukocytosis 01/22/2016  . Neuropathy 01/22/2016  . Nephrolithiasis 01/22/2016  . Cellulitis of right hand   . Cellulitis of forearm, right 07/30/2015    Past Surgical History:  Procedure Laterality Date  . CARPAL TUNNEL RELEASE Left 04/09/2016   Procedure: CARPAL TUNNEL RELEASE;  Surgeon: Cammy Copa, MD;  Location: [facility] OR;  Service: Orthopedics;  Laterality: Left;  . HAND SURGERY    . KIDNEY STONE SURGERY     lithortripsy        Home Medications    Prior to Admission medications   Medication Sig Start Date End Date Taking? Authorizing Provider  albuterol (PROVENTIL HFA;VENTOLIN HFA) 108 (90 Base) MCG/ACT inhaler Inhale 1-2 puffs into the lungs every 6 (six) hours as needed for wheezing or shortness of breath. Patient not taking: Reported on 12/16/2017 07/21/17   Wynetta Fines, MD  azithromycin (ZITHROMAX) 250 MG tablet Take 1 tablet (250 mg total) by mouth daily. Take first 2 tablets together, then 1 every day until finished. Patient not taking: Reported on 09/16/2017 07/21/17   Wynetta Fines, MD  cephALEXin (KEFLEX) 500 MG capsule Take 1 capsule (500 mg total) by mouth 4 (four) times daily. Patient not taking: Reported on 12/16/2017 10/19/17   Jacinto Halim, PA-C  chlorpheniramine-HYDROcodone [facility] ER) 10-8 MG/5ML SUER Take 5 mLs by mouth every 12 (twelve) hours as needed for cough. Patient not taking: Reported on 10/19/2017 07/21/17   Wynetta Fines, MD  cyclobenzaprine (FLEXERIL) 10 MG tablet Take 1 tablet (10 mg total) by mouth 2 (two) times daily as needed for muscle spasms. Patient not taking: Reported on 09/16/2017 05/22/17   Janne Napoleon, NP  diclofenac (VOLTAREN) 50 MG EC tablet Take 1 tablet (50 mg total) by mouth 2 (two) times daily. Patient not taking: Reported on 09/16/2017 05/22/17   Janne Napoleon, NP  ibuprofen (ADVIL,MOTRIN) 800 MG tablet Take 1 tablet (800 mg total) by mouth 3 (three) times  daily. Patient not taking: Reported on 09/16/2017 12/29/16   Charlynne Pander, MD  naproxen (NAPROSYN) 500 MG tablet Take 1 tablet (500 mg total) by mouth 2 (two) times daily. Patient not taking: Reported on 12/16/2017 10/19/17   Maczis, Elmer Sow, PA-C  ondansetron (ZOFRAN) 4 MG tablet Take 1 tablet (4 mg total) by mouth every 8 (eight) hours as needed for nausea or vomiting. Patient not taking: Reported on 10/19/2017 09/17/17   Devoria Albe, MD  PERCOCET 5-325 MG tablet Take 1 tablet by mouth every 6 (six) hours as needed for severe pain. Patient not taking: Reported on 10/19/2017 09/17/17   Devoria Albe, MD  sulfamethoxazole-trimethoprim (BACTRIM DS,SEPTRA DS) 800-160 MG tablet Take 1 tablet by mouth 2 (two) times daily. Patient not taking: Reported on 12/16/2017 10/19/17   Jacinto Halim, PA-C  tamsulosin (FLOMAX) 0.4 MG CAPS capsule Take 1 po QD until you pass the stone. Patient not taking: Reported on 10/19/2017 09/17/17   Devoria Albe, MD    Family History Family History  Problem Relation Age of Onset  . Hypertension Other   . Diabetes Father     Social History Social History   Tobacco Use  . Smoking status: Former Games developer  . Smokeless tobacco: Never Used  . Tobacco comment: States he "vapes"  Substance Use Topics  . Alcohol use: No    Comment: occasionally   . Drug use: No     Allergies   Coconut flavor and Fentanyl   Review of Systems Review of Systems  All other systems reviewed and are negative.    Physical Exam Updated Vital Signs BP 118/78   Pulse 84   Temp 98.6 F (37 C) (Oral)   Resp 18   SpO2 98%   Physical Exam  Constitutional: He is oriented to person, place, and time. He appears well-developed and well-nourished.  Non-toxic appearance. No distress.  HENT:  Head: Normocephalic and atraumatic.  Mouth/Throat: Oropharynx is clear and moist.  Eyes: Pupils are equal, round, and reactive to light. Conjunctivae are normal. Right eye exhibits no discharge. Left eye  exhibits no discharge.  Neck: Normal range of motion. Neck supple.  Cardiovascular: Normal rate, regular rhythm, normal heart sounds and intact distal pulses. Exam reveals no gallop and no friction rub.  No murmur heard. Pulmonary/Chest: Effort normal and breath sounds normal. No respiratory distress. He exhibits no tenderness.  Abdominal: Soft. Normal appearance and bowel sounds are normal. He exhibits no distension. There is tenderness in the right lower quadrant. There is no rigidity, no rebound, no guarding, no CVA tenderness, no tenderness at McBurney's point and negative Murphy's sign.  Genitourinary:  Genitourinary Comments: Circumcisedmale. No penile discharge, erythema, tenderness, lesion, or rash. 2 descended testes without swelling, pain, lesions or rash. No inguinal lymphadenopathy or hernia.    Musculoskeletal: Normal range of motion. He exhibits no tenderness.  Lymphadenopathy:    He has no cervical adenopathy.  Neurological: He is alert and oriented to person, place, and time.  Skin:  Skin is warm and dry. Capillary refill takes less than 2 seconds. No rash noted.  Psychiatric: His behavior is normal. Judgment and thought content normal.  Nursing note and vitals reviewed.    ED Treatments / Results  Labs (all labs ordered are listed, but only abnormal results are displayed) Labs Reviewed  LIPASE, BLOOD  COMPREHENSIVE METABOLIC PANEL  CBC  URINALYSIS, ROUTINE W REFLEX MICROSCOPIC    EKG None  Radiology Ct Renal Stone Study  Result Date: 12/17/2017 CLINICAL DATA:  Right flank pain, right lower quadrant pain. History of renal stones. EXAM: CT ABDOMEN AND PELVIS WITHOUT CONTRAST TECHNIQUE: Multidetector CT imaging of the abdomen and pelvis was performed following the standard protocol without IV contrast. COMPARISON:  Renal ultrasound 10/19/2017.  CT 09/17/2017 FINDINGS: Lower chest: Included lung bases are clear. Hepatobiliary: No focal liver abnormality is seen. No  gallstones, gallbladder wall thickening, or biliary dilatation. Pancreas: Fatty atrophy.  No ductal dilatation or inflammation. Spleen: Normal in size without focal abnormality. Adrenals/Urinary Tract: No adrenal nodule. No hydronephrosis. Punctate nonobstructing stones in both kidneys. Both ureters are decompressed without stones along the course. Urinary bladder completely decompressed without stone. No urethral stone visualized. Stomach/Bowel: Stomach is within normal limits. Intraluminal high-density material in the appendix is unchanged from prior exam, which may represent an appendicolith or retained contrast from prior radiologic studies. No appendicitis. No evidence of bowel wall thickening, distention, or inflammatory changes. Vascular/Lymphatic: Prominent central mesenteric and ileocolic nodes measuring up to 9 mm, similar to prior exam. No enlarged abdominal or pelvic lymph nodes. No acute vascular findings. Reproductive: Prostate is unremarkable. Other: No free air, free fluid, or intra-abdominal fluid collection. Musculoskeletal: There are no acute or suspicious osseous abnormalities. IMPRESSION: 1. Small bilateral nonobstructing renal calculi. No hydronephrosis or obstructive uropathy. 2. High-density material in the appendix may represent appendicolith or retained contrast from prior radiologic exam, unchanged from priors. No appendicitis. Electronically Signed   By: Rubye Oaks M.D.   On: 12/17/2017 01:21    Procedures Procedures (including critical care time)  Medications Ordered in ED Medications  morphine 4 MG/ML injection 4 mg (has no administration in time range)  ondansetron (ZOFRAN) injection 4 mg (has no administration in time range)     Initial Impression / Assessment and Plan / ED Course  I have reviewed the triage vital signs and the nursing notes.  Pertinent labs & imaging results that were available during my care of the patient were reviewed by me and considered in  my medical decision making (see chart for details).     Patient presents to the emergency department today for evaluation of right lower quadrant abdominal pain.  Reports pain radiates to his right flank.  History of kidney stones and appendicolith.  Patient reports associated nausea and vomiting but denies any fevers or change in his bowel habits.  Vital signs reassuring in the ED.  Patient does have some pain to palpation of the right lower quadrant but no signs of CVA.  No peritoneal signs.  Bowel sounds present in all 4 quadrants.  Lab work reassuring.  No leukocytosis.  Normal kidney function and liver enzymes.  Lipase is normal.  UA shows no signs of infection.  Patient does have history of kidney stones and discussed ultrasound with patient.  He is requesting a CT scan given the concern for the appendicolith and possible appendicitis.  However this is lower on my differential at this time.   CT scan was obtained;  IMPRESSION: 1. Small  bilateral nonobstructing renal calculi. No hydronephrosis or obstructive uropathy. 2. High-density material in the appendix may represent appendicolith or retained contrast from prior radiologic exam, unchanged from priors. No appendicitis.   Unknown etiology of patient's pain.  Pain has improved with Toradol and morphine in the ED.  Tolerating p.o. fluids.  Repeat abdominal exam reveals no signs of peritonitis.  I have given patient follow-up with general surgery if pain persist.  Will give short course of Zofran for nausea and vomiting.  Encourage Motrin and Tylenol at home for pain.  Pt is hemodynamically stable, in NAD, & able to ambulate in the ED. Evaluation does not show pathology that would require ongoing emergent intervention or inpatient treatment. I explained the diagnosis to the patient. Pain has been managed & has no complaints prior to dc. Pt is comfortable with above plan and is stable for discharge at this time. All questions were answered  prior to disposition. Strict return precautions for f/u to the ED were discussed. Encouraged follow up with PCP.   Final Clinical Impressions(s) / ED Diagnoses   Final diagnoses:  Right lower quadrant abdominal pain    ED Discharge Orders        Ordered    ondansetron (ZOFRAN ODT) 4 MG disintegrating tablet  Every 8 hours PRN     12/17/17 0309       Rise MuLeaphart, Nour Rodrigues T, PA-C 12/17/17 0319    Derwood KaplanNanavati, Ankit, MD 12/17/17 [id number]

## 2017-12-17 NOTE — Discharge Instructions (Addendum)
Your work-up has been reassuring today except for the appendicolith.  No signs of appendicitis.  Unknown if this was causing her pain.  You may want to follow-up with a general surgeon concerning this.  If you develop any fevers, worsening pain return the ED immediately.  Take Zofran for any nausea or vomiting.

## 2018-01-21 ENCOUNTER — Emergency Department (HOSPITAL_COMMUNITY)
Admission: EM | Admit: 2018-01-21 | Discharge: 2018-01-21 | Disposition: A | Discharge status: Home / Self Care | Payer: Medicaid Other | Attending: Emergency Medicine | Admitting: Emergency Medicine

## 2018-01-21 ENCOUNTER — Encounter (HOSPITAL_COMMUNITY): Payer: Self-pay

## 2018-01-21 ENCOUNTER — Other Ambulatory Visit: Payer: Self-pay

## 2018-01-21 DIAGNOSIS — Z87891 Personal history of nicotine dependence: Secondary | ICD-10-CM | POA: Insufficient documentation

## 2018-01-21 DIAGNOSIS — L03113 Cellulitis of right upper limb: Secondary | ICD-10-CM | POA: Insufficient documentation

## 2018-01-21 LAB — CBC WITH DIFFERENTIAL/PLATELET
Basophils Absolute: 0 10*3/uL (ref 0.0–0.1)
Basophils Relative: 0 %
EOS ABS: 0.1 10*3/uL (ref 0.0–0.7)
Eosinophils Relative: 1 %
HCT: 44.8 % (ref 39.0–52.0)
HEMOGLOBIN: 15.6 g/dL (ref 13.0–17.0)
LYMPHS ABS: 3.3 10*3/uL (ref 0.7–4.0)
Lymphocytes Relative: 31 %
MCH: 29.5 pg (ref 26.0–34.0)
MCHC: 34.8 g/dL (ref 30.0–36.0)
MCV: 84.8 fL (ref 78.0–100.0)
Monocytes Absolute: 1 10*3/uL (ref 0.1–1.0)
Monocytes Relative: 9 %
NEUTROS PCT: 59 %
Neutro Abs: 6.3 10*3/uL (ref 1.7–7.7)
Platelets: 312 10*3/uL (ref 150–400)
RBC: 5.28 MIL/uL (ref 4.22–5.81)
RDW: 13.6 % (ref 11.5–15.5)
WBC: 10.7 10*3/uL — AB (ref 4.0–10.5)

## 2018-01-21 LAB — BASIC METABOLIC PANEL
Anion gap: 9 (ref 5–15)
BUN: 14 mg/dL (ref 6–20)
CHLORIDE: 108 mmol/L (ref 98–111)
CO2: 22 mmol/L (ref 22–32)
Calcium: 9.2 mg/dL (ref 8.9–10.3)
Creatinine, Ser: 0.92 mg/dL (ref 0.61–1.24)
GFR calc Af Amer: >60 mL/min (ref >60)
GFR calc non Af Amer: >60 mL/min (ref >60)
Glucose, Bld: 94 mg/dL (ref 70–99)
POTASSIUM: 4.4 mmol/L (ref 3.5–5.1)
SODIUM: 139 mmol/L (ref 135–145)

## 2018-01-21 MED ORDER — NAPROXEN 375 MG PO TABS: 375.0000 mg | ORAL_TABLET | Freq: Two times a day (BID) | ORAL | 0 refills | Status: AC

## 2018-01-21 MED ORDER — ACETAMINOPHEN 500 MG PO TABS
1000.0000 mg | ORAL_TABLET | Freq: Once | ORAL | Status: AC
  Administered 2018-01-21: 1000 mg via ORAL
  Filled 2018-01-21: qty 2

## 2018-01-21 MED ORDER — SULFAMETHOXAZOLE-TRIMETHOPRIM 800-160 MG PO TABS: 1.0000 | ORAL_TABLET | Freq: Two times a day (BID) | ORAL | 0 refills | Status: AC

## 2018-01-21 MED ORDER — CEFAZOLIN SODIUM-DEXTROSE 1-4 GM/50ML-% IV SOLN
1.0000 g | Freq: Once | INTRAVENOUS | Status: AC
  Administered 2018-01-21: 1 g via INTRAVENOUS
  Filled 2018-01-21: qty 50

## 2018-01-21 MED ORDER — OXYCODONE-ACETAMINOPHEN 5-325 MG PO TABS
1.0000 | ORAL_TABLET | Freq: Once | ORAL | Status: AC
  Administered 2018-01-21: 1 via ORAL
  Filled 2018-01-21: qty 1

## 2018-01-21 MED ORDER — CEPHALEXIN 500 MG PO CAPS: 500.0000 mg | ORAL_CAPSULE | Freq: Two times a day (BID) | ORAL | 0 refills | Status: AC

## 2018-01-21 NOTE — ED Provider Notes (Signed)
[facility]-EMERGENCY DEPT Provider Note   CSN: [id number] Arrival date & time: 01/21/18  1528     History   Chief Complaint Chief Complaint  Patient presents with  . Cellulitis    HPI Jacob Nixon. is a 27 y.o. male.  HPI   Patient is a 27 year old male with a history of recurrent cellulitis of the right hand, kidney stones, who presents emergency department today for evaluation of right hand redness and warmth that began this morning.  Patient states he initially had a small area on his right middle finger which is since grown to the dorsal surface of his right hand.  Also reports associated pain.  Has had sweats and chills today as well but has not taken his temperature at home.  Has not taken any medications for his symptoms or subjective fever.  He has normal range of motion to the hand.  Patient denies IV drug use.  Denies ever being diagnosed with diabetes.  Reviewed prior records.  Patient has been seen multiple times for similar complaints. Per ED note on 10/19/17, "He had a recurrent problem with cellulitis of the right hand/lower forearm several years ago.  He has had extensive studies done with Doppler study, MRI, CT and x-rays that were all reassuring.  He was seen by rheumatology with a negative rheumatology panel.  He is followed by infectious disease as well as orthopedics for this.  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  He notes that 1 week ago history detailing occurs again prior to the onset of his symptoms.  He denies any trauma to the area.  No fever, chills, nausea/vomiting, IV drug use, history of immunocompromise, drainage, preceding injury/open wounds, numbness/tingling/weakness."  Past Medical History:  Diagnosis Date  . Bronchitis   . Cellulitis of right hand - RECURRENT   . Kidney stones   . Kidney stones     Patient Active  Problem List   Diagnosis Date Noted  . Hyponatremia 10/26/2016  . Hypokalemia 10/26/2016  . Elevated glucose level   . Cellulitis of hand 08/04/2016  . Carpal tunnel syndrome on left 05/08/2016  . Abscess of upper arm   . Cellulitis 01/22/2016  . Leukocytosis 01/22/2016  . Neuropathy 01/22/2016  . Nephrolithiasis 01/22/2016  . Cellulitis of right hand   . Cellulitis of forearm, right 07/30/2015    Past Surgical History:  Procedure Laterality Date  . CARPAL TUNNEL RELEASE Left 04/09/2016   Procedure: CARPAL TUNNEL RELEASE;  Surgeon: Cammy Copa, MD;  Location: [facility] OR;  Service: Orthopedics;  Laterality: Left;  . HAND SURGERY    . KIDNEY STONE SURGERY     lithortripsy        Home Medications    Prior to Admission medications   Medication Sig Start Date End Date Taking? Authorizing Provider  albuterol (PROVENTIL HFA;VENTOLIN HFA) 108 (90 Base) MCG/ACT inhaler Inhale 1-2 puffs into the lungs every 6 (six) hours as needed for wheezing or shortness of breath. Patient not taking: Reported on 12/16/2017 07/21/17   Wynetta Fines, MD  azithromycin (ZITHROMAX) 250 MG tablet Take 1 tablet (250 mg total) by mouth daily. Take first 2 tablets together, then 1 every day until finished. Patient not taking: Reported on 09/16/2017 07/21/17   Wynetta Fines, MD  cephALEXin (KEFLEX) 500 MG capsule Take 1 capsule (500 mg total) by mouth  2 (two) times daily for 7 days. 01/21/18 01/28/18  Jamel Dunton S, PA-C  chlorpheniramine-HYDROcodone (TUSSIONEX PENNKINETIC ER) 10-8 MG/5ML SUER Take 5 mLs by mouth every 12 (twelve) hours as needed for cough. Patient not taking: Reported on 10/19/2017 07/21/17   Wynetta Fines, MD  cyclobenzaprine (FLEXERIL) 10 MG tablet Take 1 tablet (10 mg total) by mouth 2 (two) times daily as needed for muscle spasms. Patient not taking: Reported on 09/16/2017 05/22/17   Janne Napoleon, NP  diclofenac (VOLTAREN) 50 MG EC tablet Take 1 tablet (50 mg total) by mouth 2 (two)  times daily. Patient not taking: Reported on 09/16/2017 05/22/17   Janne Napoleon, NP  ibuprofen (ADVIL,MOTRIN) 800 MG tablet Take 1 tablet (800 mg total) by mouth 3 (three) times daily. Patient not taking: Reported on 09/16/2017 12/29/16   Charlynne Pander, MD  naproxen (NAPROSYN) 375 MG tablet Take 1 tablet (375 mg total) by mouth 2 (two) times daily for 7 days. 01/21/18 01/28/18  Revella Shelton S, PA-C  ondansetron (ZOFRAN ODT) 4 MG disintegrating tablet Take 1 tablet (4 mg total) by mouth every 8 (eight) hours as needed for nausea or vomiting. Patient not taking: Reported on 01/21/2018 12/17/17   Demetrios Loll T, PA-C  ondansetron (ZOFRAN) 4 MG tablet Take 1 tablet (4 mg total) by mouth every 8 (eight) hours as needed for nausea or vomiting. Patient not taking: Reported on 10/19/2017 09/17/17   Devoria Albe, MD  PERCOCET 5-325 MG tablet Take 1 tablet by mouth every 6 (six) hours as needed for severe pain. Patient not taking: Reported on 10/19/2017 09/17/17   Devoria Albe, MD  sulfamethoxazole-trimethoprim (BACTRIM DS,SEPTRA DS) 800-160 MG tablet Take 1 tablet by mouth 2 (two) times daily for 7 days. 01/21/18 01/28/18  Verneice Caspers S, PA-C  tamsulosin (FLOMAX) 0.4 MG CAPS capsule Take 1 po QD until you pass the stone. Patient not taking: Reported on 10/19/2017 09/17/17   Devoria Albe, MD    Family History Family History  Problem Relation Age of Onset  . Hypertension Other   . Diabetes Father     Social History Social History   Tobacco Use  . Smoking status: Former Games developer  . Smokeless tobacco: Never Used  Substance Use Topics  . Alcohol use: No    Comment: occasionally   . Drug use: No     Allergies   Coconut flavor and Fentanyl   Review of Systems Review of Systems  Constitutional: Positive for chills and diaphoresis.  HENT: Positive for congestion.   Eyes: Negative for visual disturbance.  Respiratory: Negative for shortness of breath.   Cardiovascular: Negative for chest pain.    Gastrointestinal: Negative for abdominal pain, diarrhea, nausea and vomiting.  Genitourinary: Negative for dysuria.  Musculoskeletal:       Right hand pain  Skin: Positive for color change. Negative for wound.  Neurological: Negative for headaches.   Physical Exam Updated Vital Signs BP 124/82 (BP Location: Right Arm)   Pulse 76   Temp (!) 97.4 F (36.3 C) (Oral)   Resp 20   Ht 5\' 11"  (1.803 m)   Wt 119.1 kg   SpO2 97%   BMI 36.61 kg/m   Physical Exam  Constitutional: He appears well-developed and well-nourished.  HENT:  Head: Normocephalic and atraumatic.  Eyes: Conjunctivae are normal.  Neck: Neck supple.  Cardiovascular: Normal rate and regular rhythm.  No murmur heard. Pulmonary/Chest: Effort normal and breath sounds normal. No respiratory distress.  Abdominal: Soft. There  is no tenderness.  Musculoskeletal:  Right hand with erythema to the dorsal surface. There is overlying warmth and tenderness. No obvious abrasions or breaks in the skin. FROM of all of the fingers. No Fusiform swelling of the fingers. No TTP along the flexor tendons. Area of concern pictured below.  Neurological: He is alert.  Skin: Skin is warm and dry. Capillary refill takes less than 2 seconds.  Psychiatric: He has a normal mood and affect.  Nursing note and vitals reviewed.      ED Treatments / Results  Labs (all labs ordered are listed, but only abnormal results are displayed) Labs Reviewed  CBC WITH DIFFERENTIAL/PLATELET - Abnormal; Notable for the following components:      Result Value   WBC 10.7 (*)    All other components within normal limits  BASIC METABOLIC PANEL    EKG None  Radiology No results found.  Procedures Procedures (including critical care time)  Medications Ordered in ED Medications  ceFAZolin (ANCEF) IVPB 1 g/50 mL premix (0 g Intravenous Stopped 01/21/18 2135)  acetaminophen (TYLENOL) tablet 1,000 mg (1,000 mg Oral Given 01/21/18 2009)   oxyCODONE-acetaminophen (PERCOCET/ROXICET) 5-325 MG per tablet 1 tablet (1 tablet Oral Given 01/21/18 2139)     Initial Impression / Assessment and Plan / ED Course  I have reviewed the triage vital signs and the nursing notes.  Pertinent labs & imaging results that were available during my care of the patient were reviewed by me and considered in my medical decision making (see chart for details).     Final Clinical Impressions(s) / ED Diagnoses   Final diagnoses:  Cellulitis of right upper extremity   Pt is without risk factors for HIV; no recent use of steroids or other immunosuppressive medications; no Hx of diabetes. No fevers.  CBC with mild leukocytosis to 10.7. Pt is without gross abscess for which I&D would be possible.  Area marked and pt encouraged to return if redness begins to streak, extends beyond the markings, and/or fever or nausea/vomiting develop.  Pt is alert, oriented, NAD, afebrile, non tachycardic, nonseptic and nontoxic appearing.  Pt to be d/c on oral antibiotics with strict f/u instructions to return for wound recheck in 24-48 hours or sooner if worsening signs of infection. Pt voices an understanding of the plan and reasons to return to the ED. All questions answered.   ED Discharge Orders         Ordered    sulfamethoxazole-trimethoprim (BACTRIM DS,SEPTRA DS) 800-160 MG tablet  2 times daily     01/21/18 2141    cephALEXin (KEFLEX) 500 MG capsule  2 times daily     01/21/18 2141    naproxen (NAPROSYN) 375 MG tablet  2 times daily     01/21/18 2142           Karrie Meres, PA-C 01/21/18 2251    Benjiman Core, MD 01/22/18 0004

## 2018-01-21 NOTE — Discharge Instructions (Signed)
You were given a prescription for antibiotics. Please take the antibiotic prescription fully.   You may alternate taking Tylenol and Naproxen as needed for pain control. You may take Naproxen twice daily as directed on your discharge paperwork and you may take  [phone] mg of Tylenol every 6 hours. Do not exceed 4000 mg of Tylenol daily as this can lead to liver damage. Also, make sure to take Naproxen with meals as it can cause an upset stomach. Do not take other NSAIDs while taking Naproxen such as (Aleve, Ibuprofen, Aspirin, Celebrex, etc) and do not take more than the prescribed dose as this can lead to ulcers and bleeding in your GI tract. You may use warm and cold compresses to help with your symptoms.   Please follow up for a wound recheck in the next 24-48 hours and return sooner for any new or worsening symptoms or any symptoms that indicate worsening infection such as fevers, increased redness/swelling/pain, warmth, or drainage from the affected area.

## 2018-01-21 NOTE — ED Notes (Signed)
Pt refused rectal temp.

## 2018-01-21 NOTE — ED Triage Notes (Signed)
Pt states that his right hand was red and warm starting this morning. Pt states that this has been a recurring problem.

## 2018-03-28 ENCOUNTER — Encounter (HOSPITAL_COMMUNITY): Payer: Self-pay | Admitting: *Deleted

## 2018-03-28 ENCOUNTER — Emergency Department (HOSPITAL_COMMUNITY)
Admission: EM | Admit: 2018-03-28 | Discharge: 2018-03-29 | Disposition: A | Discharge status: Home / Self Care | Payer: Self-pay | Attending: Emergency Medicine | Admitting: Emergency Medicine

## 2018-03-28 ENCOUNTER — Emergency Department (HOSPITAL_COMMUNITY): Payer: Self-pay

## 2018-03-28 ENCOUNTER — Other Ambulatory Visit: Payer: Self-pay

## 2018-03-28 DIAGNOSIS — R509 Fever, unspecified: Secondary | ICD-10-CM | POA: Insufficient documentation

## 2018-03-28 DIAGNOSIS — R1011 Right upper quadrant pain: Secondary | ICD-10-CM | POA: Insufficient documentation

## 2018-03-28 DIAGNOSIS — R112 Nausea with vomiting, unspecified: Secondary | ICD-10-CM | POA: Insufficient documentation

## 2018-03-28 DIAGNOSIS — R197 Diarrhea, unspecified: Secondary | ICD-10-CM | POA: Insufficient documentation

## 2018-03-28 DIAGNOSIS — Z87891 Personal history of nicotine dependence: Secondary | ICD-10-CM | POA: Insufficient documentation

## 2018-03-28 DIAGNOSIS — R05 Cough: Secondary | ICD-10-CM | POA: Insufficient documentation

## 2018-03-28 DIAGNOSIS — M545 Low back pain: Secondary | ICD-10-CM | POA: Insufficient documentation

## 2018-03-28 DIAGNOSIS — M791 Myalgia, unspecified site: Secondary | ICD-10-CM | POA: Insufficient documentation

## 2018-03-28 LAB — COMPREHENSIVE METABOLIC PANEL
ALBUMIN: 3.6 g/dL (ref 3.5–5.0)
ALK PHOS: 67 U/L (ref 38–126)
ALT: 19 U/L (ref 0–44)
ANION GAP: 9 (ref 5–15)
AST: 22 U/L (ref 15–41)
BILIRUBIN TOTAL: 1 mg/dL (ref 0.3–1.2)
BUN: 11 mg/dL (ref 6–20)
CO2: 22 mmol/L (ref 22–32)
Calcium: 8.5 mg/dL — ABNORMAL LOW (ref 8.9–10.3)
Chloride: 103 mmol/L (ref 98–111)
Creatinine, Ser: 0.91 mg/dL (ref 0.61–1.24)
GFR calc Af Amer: >60 mL/min (ref >60)
GFR calc non Af Amer: >60 mL/min (ref >60)
GLUCOSE: 112 mg/dL — AB (ref 70–99)
Potassium: 4 mmol/L (ref 3.5–5.1)
Sodium: 134 mmol/L — ABNORMAL LOW (ref 135–145)
TOTAL PROTEIN: 7.5 g/dL (ref 6.5–8.1)

## 2018-03-28 LAB — CBC
HCT: 48 % (ref 39.0–52.0)
Hemoglobin: 15.9 g/dL (ref 13.0–17.0)
MCH: 29 pg (ref 26.0–34.0)
MCHC: 33.1 g/dL (ref 30.0–36.0)
MCV: 87.6 fL (ref 80.0–100.0)
NRBC: 0 % (ref 0.0–0.2)
PLATELETS: 297 10*3/uL (ref 150–400)
RBC: 5.48 MIL/uL (ref 4.22–5.81)
RDW: 13.7 % (ref 11.5–15.5)
WBC: 12.8 10*3/uL — ABNORMAL HIGH (ref 4.0–10.5)

## 2018-03-28 LAB — LIPASE, BLOOD: Lipase: 22 U/L (ref 11–51)

## 2018-03-28 MED ORDER — ACETAMINOPHEN 500 MG PO TABS
1000.0000 mg | ORAL_TABLET | Freq: Once | ORAL | Status: AC
  Administered 2018-03-28: 1000 mg via ORAL
  Filled 2018-03-28: qty 2

## 2018-03-28 MED ORDER — ONDANSETRON HCL 4 MG/2ML IJ SOLN
4.0000 mg | Freq: Once | INTRAMUSCULAR | Status: AC
  Administered 2018-03-28: 4 mg via INTRAVENOUS
  Filled 2018-03-28: qty 2

## 2018-03-28 MED ORDER — SODIUM CHLORIDE 0.9 % IV BOLUS
1000.0000 mL | Freq: Once | INTRAVENOUS | Status: AC
  Administered 2018-03-28: 1000 mL via INTRAVENOUS

## 2018-03-28 MED ORDER — KETOROLAC TROMETHAMINE 15 MG/ML IJ SOLN
15.0000 mg | Freq: Once | INTRAMUSCULAR | Status: AC
  Administered 2018-03-28: 15 mg via INTRAVENOUS
  Filled 2018-03-28: qty 1

## 2018-03-28 NOTE — ED Triage Notes (Signed)
Pt c/o v/d that began today.

## 2018-03-28 NOTE — ED Provider Notes (Signed)
[facility]-EMERGENCY DEPT Provider Note   CSN: [id number] Arrival date & time: 03/28/18  2032     History   Chief Complaint Chief Complaint  Patient presents with  . Emesis  . Diarrhea    HPI Jacob Nixon. is a 27 y.o. male ending for evaluation of nausea, vomiting, diarrhea.  Patient states this morning, he woke up and has had persistent nausea, vomiting, and diarrhea all day.  He states he has had 7-8 episodes of vomiting, 5-6 episodes of diarrhea.  He denies hematemesis or hematochezia.  Patient states he has mild right upper quadrant abdominal pain.  This is worse when vomiting.  He reports fevers, generalized aches, and low back pain bilaterally.  He has not been able to tolerate any p.o.  He has not taken anything for his symptoms.  Additionally, patient reports a nonproductive cough.  He denies ear pain, eye pain, nasal congestion, sore throat, chest pain, shortness of breath.  He reports decreased urination, but no dysuria or hematuria.  He has a history of kidney stones, but states this feels different.  Patient states he has a history of an appendicolith.  He has never had any abdominal surgeries.  He has no other medical problems, takes no medications daily.  HPI  Past Medical History:  Diagnosis Date  . Bronchitis   . Cellulitis of right hand - RECURRENT   . Kidney stones   . Kidney stones     Patient Active Problem List   Diagnosis Date Noted  . Hyponatremia 10/26/2016  . Hypokalemia 10/26/2016  . Elevated glucose level   . Cellulitis of hand 08/04/2016  . Carpal tunnel syndrome on left 05/08/2016  . Abscess of upper arm   . Cellulitis 01/22/2016  . Leukocytosis 01/22/2016  . Neuropathy 01/22/2016  . Nephrolithiasis 01/22/2016  . Cellulitis of right hand   . Cellulitis of forearm, right 07/30/2015    Past Surgical History:  Procedure Laterality Date  . CARPAL TUNNEL RELEASE Left 04/09/2016   Procedure: CARPAL TUNNEL RELEASE;   Surgeon: Cammy Copa, MD;  Location: [facility] OR;  Service: Orthopedics;  Laterality: Left;  . HAND SURGERY    . KIDNEY STONE SURGERY     lithortripsy        Home Medications    Prior to Admission medications   Not on File    Family History Family History  Problem Relation Age of Onset  . Hypertension Other   . Diabetes Father     Social History Social History   Tobacco Use  . Smoking status: Former Games developer  . Smokeless tobacco: Never Used  Substance Use Topics  . Alcohol use: No    Comment: occasionally   . Drug use: No     Allergies   Coconut flavor and Fentanyl   Review of Systems Review of Systems  Constitutional: Positive for fever.  Respiratory: Positive for cough.   Gastrointestinal: Positive for abdominal pain, diarrhea, nausea and vomiting.  Musculoskeletal: Positive for back pain and myalgias.  All other systems reviewed and are negative.    Physical Exam Updated Vital Signs BP 106/85 (BP Location: Left Arm)   Pulse (!) 130   Temp (!) 101.2 F (38.4 C) (Oral)   Resp 18   Ht 5\' 11"  (1.803 m)   Wt 117.9 kg   SpO2 99%   BMI 36.26 kg/m   Physical Exam  Constitutional: He is oriented to person, place, and time. He appears well-developed and well-nourished. No  distress.  Sitting in the bed in no acute distress  HENT:  Head: Normocephalic and atraumatic.  OP clear without tonsillar swelling or exudate.  Uvula midline with equal palate rise.  TMs nonerythematous not bulging bilaterally. MM moist  Eyes: Pupils are equal, round, and reactive to light. Conjunctivae and EOM are normal.  Neck: Normal range of motion. Neck supple.  Cardiovascular: Normal rate, regular rhythm and intact distal pulses.  Pulmonary/Chest: Effort normal and breath sounds normal. No respiratory distress. He has no wheezes.  Speaking in full sentences.  Clear lung sounds in all fields.  Abdominal: Soft. He exhibits no distension and no mass. There is tenderness. There is  no rebound and no guarding.  Mild tenderness palpation of the right upper quadrant.  Negative Murphy sign.  Pain extends to the right side.  No flank pain.  No pain at McBurney's point.  Negative rebound.  No tenderness palpation elsewhere in the abdomen.  No rigidity, guarding, distention.  Musculoskeletal: Normal range of motion.  Neurological: He is alert and oriented to person, place, and time.  Skin: Skin is warm and dry. Capillary refill takes less than 2 seconds.  Psychiatric: He has a normal mood and affect.  Nursing note and vitals reviewed.    ED Treatments / Results  Labs (all labs ordered are listed, but only abnormal results are displayed) Labs Reviewed  COMPREHENSIVE METABOLIC PANEL - Abnormal; Notable for the following components:      Result Value   Sodium 134 (*)    Glucose, Bld 112 (*)    Calcium 8.5 (*)    All other components within normal limits  CBC - Abnormal; Notable for the following components:   WBC 12.8 (*)    All other components within normal limits  LIPASE, BLOOD  URINALYSIS, ROUTINE W REFLEX MICROSCOPIC    EKG None  Radiology Dg Chest 2 View  Result Date: 03/28/2018 CLINICAL DATA:  Acute onset of vomiting and diarrhea. EXAM: CHEST - 2 VIEW COMPARISON:  Chest radiograph performed 07/21/2017 FINDINGS: The lungs are well-aerated and clear. There is no evidence of focal opacification, pleural effusion or pneumothorax. The heart is normal in size; the mediastinal contour is within normal limits. No acute osseous abnormalities are seen. IMPRESSION: No acute cardiopulmonary process seen. Electronically Signed   By: Roanna Raider M.D.   On: 03/28/2018 21:56   Dg Abdomen 1 View  Result Date: 03/28/2018 CLINICAL DATA:  Acute onset of lower back pain. Personal history of renal stones. EXAM: ABDOMEN - 1 VIEW COMPARISON:  CT of the abdomen and pelvis from 12/17/2017 FINDINGS: Known tiny bilateral renal stones are not well characterized on radiograph. No  definite obstructing ureteral stones are identified. The visualized bowel gas pattern is grossly unremarkable. A small amount of stool is seen in the colon. No free intra-abdominal air is identified, though evaluation for free air is limited on a single supine view. No acute osseous abnormalities are seen. IMPRESSION: Known tiny bilateral renal stones are not well characterized on radiograph. No definite obstructing ureteral stones seen. Electronically Signed   By: Roanna Raider M.D.   On: 03/28/2018 21:55    Procedures Procedures (including critical care time)  Medications Ordered in ED Medications  sodium chloride 0.9 % bolus 1,000 mL (1,000 mLs Intravenous New Bag/Given 03/28/18 2203)  ondansetron (ZOFRAN) injection 4 mg (4 mg Intravenous Given 03/28/18 2147)  acetaminophen (TYLENOL) tablet 1,000 mg (1,000 mg Oral Given 03/28/18 2147)     Initial Impression / Assessment  and Plan / ED Course  I have reviewed the triage vital signs and the nursing notes.  Pertinent labs & imaging results that were available during my care of the patient were reviewed by me and considered in my medical decision making (see chart for details).     Presenting for evaluation of nausea, vomiting, diarrhea, and mild abdominal pain.  Physical exam shows patient who is febrile and tachycardic, but appears nontoxic.  As patient is having mostly vomiting and diarrhea for 1 day, consider viral source.  Additional differential includes appendicitis, colitis, cholecystitis.  Will obtain labs and urine for further evaluation.  Chest x-ray to rule out pneumonia, KUB to assess for kidney stone.  Will hold off on CT scan at this time, as patient has had frequent CT scans.  Zofran, fluids, Tylenol, and Toradol for symptom control.  Plan to reassess patient to see if CT scan is necessary.  On reevaluation after Zofran, patient reports nausea is resolved.  Patient reports continued low back pain.  Labs pending.  X-rays viewed and  interpreted by me, chest x-ray without obvious pneumonia, pneumothorax, effusion, cardiomegaly.  KUB without obvious stone, per radiology, no definitive obstructive stone.   Pt signed out to Nelly Laurence, PA-C for f/u on labs and reassessment of the abd. If sxs resolved/abd exam is reassuring, will d/c with symptomatic tx. If labs concerning or exam is concerning, consider need for CT vs RUQ [place].   Final Clinical Impressions(s) / ED Diagnoses   Final diagnoses:  None    ED Discharge Orders    None       Alveria Apley, PA-C 03/28/18 2253    Shaune Pollack, MD 03/29/18 1155

## 2018-03-28 NOTE — ED Provider Notes (Signed)
Care assumed at shift change from [facility], PA-C.  Per her note, "Jacob Nixon. is a 27 y.o. male ending for evaluation of nausea, vomiting, diarrhea.  Patient states this morning, he woke up and has had persistent nausea, vomiting, and diarrhea all day.  He states he has had 7-8 episodes of vomiting, 5-6 episodes of diarrhea.  He denies hematemesis or hematochezia.  Patient states he has mild right upper quadrant abdominal pain.  This is worse when vomiting.  He reports fevers, generalized aches, and low back pain bilaterally.  He has not been able to tolerate any p.o.  He has not taken anything for his symptoms.  Additionally, patient reports a nonproductive cough.  He denies ear pain, eye pain, nasal congestion, sore throat, chest pain, shortness of breath.  He reports decreased urination, but no dysuria or hematuria.  He has a history of kidney stones, but states this feels different.  Patient states he has a history of an appendicolith.  He has never had any abdominal surgeries.  He has no other medical problems, takes no medications daily."  Pt was found to be febrile and tachycardic in the ED. Plan is to obtain labs, give antiemetics, IVF, pain medication, and reassess. Pt also pending CXR and KUB.  Results for orders placed or performed during the hospital encounter of 03/28/18  Lipase, blood  Result Value Ref Range   Lipase 22 11 - 51 U/L  Comprehensive metabolic panel  Result Value Ref Range   Sodium 134 (L) 135 - 145 mmol/L   Potassium 4.0 3.5 - 5.1 mmol/L   Chloride 103 98 - 111 mmol/L   CO2 22 22 - 32 mmol/L   Glucose, Bld 112 (H) 70 - 99 mg/dL   BUN 11 6 - 20 mg/dL   Creatinine, Ser 4.09 0.61 - 1.24 mg/dL   Calcium 8.5 (L) 8.9 - 10.3 mg/dL   Total Protein 7.5 6.5 - 8.1 g/dL   Albumin 3.6 3.5 - 5.0 g/dL   AST 22 15 - 41 U/L   ALT 19 0 - 44 U/L   Alkaline Phosphatase 67 38 - 126 U/L   Total Bilirubin 1.0 0.3 - 1.2 mg/dL   GFR calc non Af Amer >60 >60 mL/min   GFR calc Af Amer >60 >60 mL/min   Anion gap 9 5 - 15  CBC  Result Value Ref Range   WBC 12.8 (H) 4.0 - 10.5 K/uL   RBC 5.48 4.22 - 5.81 MIL/uL   Hemoglobin 15.9 13.0 - 17.0 g/dL   HCT 81.1 91.4 - 78.2 %   MCV 87.6 80.0 - 100.0 fL   MCH 29.0 26.0 - 34.0 pg   MCHC 33.1 30.0 - 36.0 g/dL   RDW 95.6 21.3 - 08.6 %   Platelets 297 150 - 400 K/uL   nRBC 0.0 0.0 - 0.2 %  Urinalysis, Routine w reflex microscopic  Result Value Ref Range   Color, Urine YELLOW YELLOW   APPearance CLEAR CLEAR   Specific Gravity, Urine 1.016 1.005 - 1.030   pH 6.0 5.0 - 8.0   Glucose, UA NEGATIVE NEGATIVE mg/dL   Hgb urine dipstick SMALL (A) NEGATIVE   Bilirubin Urine NEGATIVE NEGATIVE   Ketones, ur NEGATIVE NEGATIVE mg/dL   Protein, ur NEGATIVE NEGATIVE mg/dL   Nitrite NEGATIVE NEGATIVE   Leukocytes, UA NEGATIVE NEGATIVE   RBC / HPF 0-5 0 - 5 RBC/hpf   WBC, UA 0-5 0 - 5 WBC/hpf   Bacteria, UA NONE  SEEN NONE SEEN   Mucus PRESENT    Dg Chest 2 View  Result Date: 03/28/2018 CLINICAL DATA:  Acute onset of vomiting and diarrhea. EXAM: CHEST - 2 VIEW COMPARISON:  Chest radiograph performed 07/21/2017 FINDINGS: The lungs are well-aerated and clear. There is no evidence of focal opacification, pleural effusion or pneumothorax. The heart is normal in size; the mediastinal contour is within normal limits. No acute osseous abnormalities are seen. IMPRESSION: No acute cardiopulmonary process seen. Electronically Signed   By: Roanna Raider M.D.   On: 03/28/2018 21:56   Dg Abdomen 1 View  Result Date: 03/28/2018 CLINICAL DATA:  Acute onset of lower back pain. Personal history of renal stones. EXAM: ABDOMEN - 1 VIEW COMPARISON:  CT of the abdomen and pelvis from 12/17/2017 FINDINGS: Known tiny bilateral renal stones are not well characterized on radiograph. No definite obstructing ureteral stones are identified. The visualized bowel gas pattern is grossly unremarkable. A small amount of stool is seen in the colon. No free  intra-abdominal air is identified, though evaluation for free air is limited on a single supine view. No acute osseous abnormalities are seen. IMPRESSION: Known tiny bilateral renal stones are not well characterized on radiograph. No definite obstructing ureteral stones seen. Electronically Signed   By: Roanna Raider M.D.   On: 03/28/2018 21:55   Ct Abdomen Pelvis W Contrast  Result Date: 03/29/2018 CLINICAL DATA:  Acute onset of generalized abdominal pain and nausea. EXAM: CT ABDOMEN AND PELVIS WITH CONTRAST TECHNIQUE: Multidetector CT imaging of the abdomen and pelvis was performed using the standard protocol following bolus administration of intravenous contrast. CONTRAST:  100 mL ISOVUE-300 IOPAMIDOL (ISOVUE-300) INJECTION 61% COMPARISON:  CT of the abdomen and pelvis performed 12/17/2017, and renal ultrasound performed 10/19/2017 FINDINGS: Lower chest: The visualized lung bases are grossly clear. The visualized portions of the mediastinum are unremarkable. Hepatobiliary: The liver is unremarkable in appearance. The gallbladder is unremarkable in appearance. The common bile duct remains normal in caliber. Pancreas: The pancreas is within normal limits. Spleen: The spleen is unremarkable in appearance. Adrenals/Urinary Tract: The adrenal glands are unremarkable in appearance. The kidneys are within normal limits. There is no evidence of hydronephrosis. No renal or ureteral stones are identified. No perinephric stranding is seen. Stomach/Bowel: The stomach is unremarkable in appearance. The small bowel is within normal limits. The appendix is normal in caliber, without evidence of appendicitis. The colon is unremarkable in appearance. Vascular/Lymphatic: The abdominal aorta is unremarkable in appearance. The inferior vena cava is grossly unremarkable. No retroperitoneal lymphadenopathy is seen. No pelvic sidewall lymphadenopathy is identified. Reproductive: The bladder is decompressed and not well  characterized. The prostate is normal in size. Other: No additional soft tissue abnormalities are seen. Musculoskeletal: No acute osseous abnormalities are identified. The visualized musculature is unremarkable in appearance. IMPRESSION: Unremarkable contrast-enhanced CT of the abdomen and pelvis. Electronically Signed   By: Roanna Raider M.D.   On: 03/29/2018 01:10     Physical Exam  Constitutional: He is well-developed, well-nourished, and in no distress. No distress.  HENT:  Head: Normocephalic and atraumatic.  Eyes: Pupils are equal, round, and reactive to light.  Neck: Neck supple.  Cardiovascular: Normal rate.  Pulmonary/Chest: Effort normal.  Abdominal: Soft. Bowel sounds are normal. There is tenderness.  TTP in the RUQ and RLQ, worse to RLQ. No rebound, guarding or rigidity.   Musculoskeletal: Normal range of motion.  Neurological: He is alert.  Skin: Skin is warm and dry.  Psychiatric: Affect normal.  Nursing note and vitals reviewed.  On reevaluation patient is still having some abdominal tenderness despite treatment with antiemetics, and pain medications.  On my exam he does have some right lower quadrant tenderness.  CBC shows a leukocytosis.  CMP within normal limits and lipase within normal limits.  UA without evidence of UTI.  Given his leukocytosis, right lower quadrant tenderness and history of the known appendicolith, discussed possibility of obtaining imaging.  Had shared decision-making discussion with the patient and he prefers to have CT abd.  CXR negative for pneumonia or other abnormality. KUB with known bilat renal stones, not well characterized. No obvious obstructing stone.  CT abd WNL, without evidence of appendicitis or other abnormality.   Given patient's normal CT scan, suspect that his symptoms are likely related to a viral gastroenteritis given his nausea, vomiting and diarrhea.  Will provide him with Rx for Zofran and have him follow-up closely with his  PCP.  Advised him to return to the ER for any new or worsening symptoms in the meantime.  Patient voices understanding the plan agrees to return to the ED.  All questions answered.   Karrie Meres, PA-C 03/29/18 0214    Melene Plan, DO 03/29/18 [phone]

## 2018-03-29 ENCOUNTER — Emergency Department (HOSPITAL_COMMUNITY): Payer: Self-pay

## 2018-03-29 ENCOUNTER — Encounter (HOSPITAL_COMMUNITY): Payer: Self-pay

## 2018-03-29 LAB — URINALYSIS, ROUTINE W REFLEX MICROSCOPIC
BACTERIA UA: NONE SEEN
BILIRUBIN URINE: NEGATIVE
GLUCOSE, UA: NEGATIVE mg/dL
Ketones, ur: NEGATIVE mg/dL
Leukocytes, UA: NEGATIVE
NITRITE: NEGATIVE
PH: 6 (ref 5.0–8.0)
Protein, ur: NEGATIVE mg/dL
SPECIFIC GRAVITY, URINE: 1.016 (ref 1.005–1.030)

## 2018-03-29 MED ORDER — IOPAMIDOL (ISOVUE-300) INJECTION 61%
100.0000 mL | Freq: Once | INTRAVENOUS | Status: AC | PRN
  Administered 2018-03-29: 100 mL via INTRAVENOUS

## 2018-03-29 MED ORDER — IOPAMIDOL (ISOVUE-300) INJECTION 61%
INTRAVENOUS | Status: AC
  Filled 2018-03-29: qty 100

## 2018-03-29 MED ORDER — SODIUM CHLORIDE (PF) 0.9 % IJ SOLN
INTRAMUSCULAR | Status: AC
  Filled 2018-03-29: qty 50

## 2018-03-29 MED ORDER — SODIUM CHLORIDE 0.9 % IV BOLUS
1000.0000 mL | Freq: Once | INTRAVENOUS | Status: AC
  Administered 2018-03-29: 1000 mL via INTRAVENOUS

## 2018-03-29 MED ORDER — ONDANSETRON HCL 4 MG PO TABS: 4.0000 mg | ORAL_TABLET | Freq: Three times a day (TID) | ORAL | 0 refills | Status: DC | PRN

## 2018-03-29 NOTE — ED Notes (Signed)
Requested patient to urinate. Patient given urinal. °

## 2018-03-29 NOTE — ED Notes (Signed)
Pt tolerating food and fluids without difficulty.

## 2018-03-29 NOTE — Discharge Instructions (Signed)
Please take medications as prescribed.  ° °Please follow up with your primary doctor within the next 5-7 days.  If you do not have a primary care provider, information for a healthcare clinic has been provided for you to make arrangements for follow up care. Please return to the ER sooner if you have any new or worsening symptoms, or if you have any of the following symptoms: ° °Abdominal pain that does not go away.  °You have a fever.  °You keep throwing up (vomiting).  °The pain is felt only in portions of the abdomen. Pain in the right side could possibly be appendicitis. In an adult, pain in the left lower portion of the abdomen could be colitis or diverticulitis.  °You pass bloody or black tarry stools.  °There is bright red blood in the stool.  °The constipation stays for more than 4 days.  °There is belly (abdominal) or rectal pain.  °You do not seem to be getting better.  °You have any questions or concerns.  ° °

## 2018-03-30 LAB — URINE CULTURE: Culture: NO GROWTH

## 2018-04-01 DIAGNOSIS — M7542 Impingement syndrome of left shoulder: Secondary | ICD-10-CM | POA: Insufficient documentation

## 2018-06-01 ENCOUNTER — Encounter (HOSPITAL_COMMUNITY): Payer: Self-pay

## 2018-06-01 ENCOUNTER — Other Ambulatory Visit: Payer: Self-pay

## 2018-06-01 ENCOUNTER — Emergency Department (HOSPITAL_COMMUNITY)
Admission: EM | Admit: 2018-06-01 | Discharge: 2018-06-01 | Disposition: A | Discharge status: Home / Self Care | Payer: Self-pay | Attending: Emergency Medicine | Admitting: Emergency Medicine

## 2018-06-01 ENCOUNTER — Emergency Department (HOSPITAL_COMMUNITY)
Admission: EM | Admit: 2018-06-01 | Discharge: 2018-06-02 | Disposition: A | Discharge status: Home / Self Care | Payer: 59 | Source: Home / Self Care | Attending: Emergency Medicine | Admitting: Emergency Medicine

## 2018-06-01 DIAGNOSIS — R0602 Shortness of breath: Secondary | ICD-10-CM | POA: Diagnosis not present

## 2018-06-01 DIAGNOSIS — Z87891 Personal history of nicotine dependence: Secondary | ICD-10-CM

## 2018-06-01 DIAGNOSIS — J989 Respiratory disorder, unspecified: Secondary | ICD-10-CM | POA: Insufficient documentation

## 2018-06-01 DIAGNOSIS — R0789 Other chest pain: Secondary | ICD-10-CM | POA: Insufficient documentation

## 2018-06-01 DIAGNOSIS — R05 Cough: Secondary | ICD-10-CM | POA: Insufficient documentation

## 2018-06-01 DIAGNOSIS — J029 Acute pharyngitis, unspecified: Secondary | ICD-10-CM | POA: Diagnosis not present

## 2018-06-01 DIAGNOSIS — R059 Cough, unspecified: Secondary | ICD-10-CM

## 2018-06-01 DIAGNOSIS — J988 Other specified respiratory disorders: Secondary | ICD-10-CM | POA: Diagnosis not present

## 2018-06-01 LAB — CBC WITH DIFFERENTIAL/PLATELET
Abs Immature Granulocytes: 0.05 10*3/uL (ref 0.00–0.07)
Basophils Absolute: 0 10*3/uL (ref 0.0–0.1)
Basophils Relative: 0 %
EOS PCT: 4 %
Eosinophils Absolute: 0.4 10*3/uL (ref 0.0–0.5)
HCT: 46 % (ref 39.0–52.0)
Hemoglobin: 15.2 g/dL (ref 13.0–17.0)
Immature Granulocytes: 1 %
Lymphocytes Relative: 30 %
Lymphs Abs: 3.3 10*3/uL (ref 0.7–4.0)
MCH: 28.9 pg (ref 26.0–34.0)
MCHC: 33 g/dL (ref 30.0–36.0)
MCV: 87.5 fL (ref 80.0–100.0)
MONO ABS: 0.7 10*3/uL (ref 0.1–1.0)
Monocytes Relative: 7 %
NRBC: 0 % (ref 0.0–0.2)
Neutro Abs: 6.4 10*3/uL (ref 1.7–7.7)
Neutrophils Relative %: 58 %
PLATELETS: 321 10*3/uL (ref 150–400)
RBC: 5.26 MIL/uL (ref 4.22–5.81)
RDW: 13.2 % (ref 11.5–15.5)
WBC: 10.9 10*3/uL — AB (ref 4.0–10.5)

## 2018-06-01 LAB — BASIC METABOLIC PANEL
Anion gap: 11 (ref 5–15)
BUN: 11 mg/dL (ref 6–20)
CALCIUM: 9 mg/dL (ref 8.9–10.3)
CHLORIDE: 105 mmol/L (ref 98–111)
CO2: 23 mmol/L (ref 22–32)
CREATININE: 1.05 mg/dL (ref 0.61–1.24)
GFR calc Af Amer: >60 mL/min (ref >60)
Glucose, Bld: 126 mg/dL — ABNORMAL HIGH (ref 70–99)
Potassium: 3.7 mmol/L (ref 3.5–5.1)
SODIUM: 139 mmol/L (ref 135–145)

## 2018-06-01 LAB — I-STAT TROPONIN, ED: TROPONIN I, POC: 0 ng/mL (ref 0.00–0.08)

## 2018-06-01 MED ORDER — PENICILLIN G BENZATHINE 1200000 UNIT/2ML IM SUSP
1.2000 10*6.[IU] | Freq: Once | INTRAMUSCULAR | Status: AC
  Administered 2018-06-01: 1.2 10*6.[IU] via INTRAMUSCULAR
  Filled 2018-06-01: qty 2

## 2018-06-01 MED ORDER — ALBUTEROL SULFATE HFA 108 (90 BASE) MCG/ACT IN AERS
2.0000 | INHALATION_SPRAY | Freq: Once | RESPIRATORY_TRACT | Status: AC
  Administered 2018-06-01: 2 via RESPIRATORY_TRACT
  Filled 2018-06-01: qty 6.7

## 2018-06-01 NOTE — ED Triage Notes (Addendum)
Pt arriving with concern of chest pain and possible allergic reaction to penicillin he received earlier today when he was diagnosed with pharyngitis. Pt reports his father is allergic to penicillin and he has never had it so he is concerned he may be allergic as well. Pt reports he feels as if something is sitting on his chest. No respiratory distress.

## 2018-06-01 NOTE — ED Provider Notes (Signed)
[facility]-EMERGENCY DEPT Provider Note   CSN: [id number] Arrival date & time: 06/01/18  0825     History   Chief Complaint Chief Complaint  Patient presents with  . Sore Throat  . Cough    HPI Jacob Nixon. is a 28 y.o. male.  Patient presents the emergency department with 1 week of sore throat and cough.  Patient states that he feels like he has bronchitis as he has had this in the past.  Patient states that yesterday he was feeling a little bit better however when he woke up this morning he had a more severe sore throat.  Describes the pain in the throat as being like glass.  He has had recent fever to 101 F.  Cough is nonproductive.  No associated wheezing.  He had an inhaler at home which he was using with some benefit but it is now out.  He has been taking over-the-counter medications with some relief.  His most bothersome symptom at this point is his sore throat.     Past Medical History:  Diagnosis Date  . Bronchitis   . Cellulitis of right hand - RECURRENT   . Kidney stones   . Kidney stones     Patient Active Problem List   Diagnosis Date Noted  . Hyponatremia 10/26/2016  . Hypokalemia 10/26/2016  . Elevated glucose level   . Cellulitis of hand 08/04/2016  . Carpal tunnel syndrome on left 05/08/2016  . Abscess of upper arm   . Cellulitis 01/22/2016  . Leukocytosis 01/22/2016  . Neuropathy 01/22/2016  . Nephrolithiasis 01/22/2016  . Cellulitis of right hand   . Cellulitis of forearm, right 07/30/2015    Past Surgical History:  Procedure Laterality Date  . CARPAL TUNNEL RELEASE Left 04/09/2016   Procedure: CARPAL TUNNEL RELEASE;  Surgeon: Cammy Copa, MD;  Location: [facility] OR;  Service: Orthopedics;  Laterality: Left;  . HAND SURGERY    . KIDNEY STONE SURGERY     lithortripsy        Home Medications    Prior to Admission medications   Medication Sig Start Date End Date Taking? Authorizing Provider  ondansetron  (ZOFRAN) 4 MG tablet Take 1 tablet (4 mg total) by mouth every 8 (eight) hours as needed for nausea or vomiting. 03/29/18   Couture, Cortni S, PA-C    Family History Family History  Problem Relation Age of Onset  . Hypertension Other   . Diabetes Father     Social History Social History   Tobacco Use  . Smoking status: Former Games developer  . Smokeless tobacco: Never Used  Substance Use Topics  . Alcohol use: No    Comment: occasionally   . Drug use: No     Allergies   Coconut flavor and Fentanyl   Review of Systems Review of Systems  Constitutional: Positive for chills and fever. Negative for fatigue.  HENT: Positive for sore throat. Negative for congestion, ear pain, rhinorrhea and sinus pressure.   Eyes: Negative for redness.  Respiratory: Positive for cough. Negative for shortness of breath and wheezing.   Gastrointestinal: Negative for abdominal pain, diarrhea, nausea and vomiting.  Genitourinary: Negative for dysuria.  Musculoskeletal: Negative for myalgias and neck stiffness.  Skin: Negative for rash.  Neurological: Negative for headaches.  Hematological: Negative for adenopathy.     Physical Exam Updated Vital Signs BP 133/85 (BP Location: Left Arm)   Pulse 82   Temp 98.3 F (36.8 C) (Oral)  Resp 16   Ht 5\' 11"  (1.803 m)   Wt 117.9 kg   SpO2 100%   BMI 36.26 kg/m   Physical Exam Vitals signs and nursing note reviewed.  Constitutional:      Appearance: He is well-developed.  HENT:     Head: Normocephalic and atraumatic.     Jaw: No trismus.     Right Ear: Tympanic membrane, ear canal and external ear normal.     Left Ear: Tympanic membrane, ear canal and external ear normal.     Nose: Nose normal. No mucosal edema or rhinorrhea.     Mouth/Throat:     Mouth: Mucous membranes are not dry.     Pharynx: Uvula midline. Oropharyngeal exudate and posterior oropharyngeal erythema present. No uvula swelling.     Tonsils: No tonsillar abscesses.      Comments: No peritonsillar abscess.  Early exudates noted right peritonsillar area. Eyes:     General:        Right eye: No discharge.        Left eye: No discharge.     Conjunctiva/sclera: Conjunctivae normal.  Neck:     Musculoskeletal: Normal range of motion and neck supple.  Cardiovascular:     Rate and Rhythm: Normal rate and regular rhythm.     Heart sounds: Normal heart sounds.  Pulmonary:     Effort: Pulmonary effort is normal. No respiratory distress.     Breath sounds: Normal breath sounds. No wheezing or rales.     Comments: Lung sounds clear. Abdominal:     Palpations: Abdomen is soft.     Tenderness: There is no abdominal tenderness.  Skin:    General: Skin is warm and dry.  Neurological:     Mental Status: He is alert.      ED Treatments / Results  Labs (all labs ordered are listed, but only abnormal results are displayed) Labs Reviewed - No data to display  EKG None  Radiology No results found.  Procedures Procedures (including critical care time)  Medications Ordered in ED Medications  penicillin g benzathine (BICILLIN LA) 1200000 UNIT/2ML injection 1.2 Million Units (has no administration in time range)  albuterol (PROVENTIL HFA;VENTOLIN HFA) 108 (90 Base) MCG/ACT inhaler 2 puff (has no administration in time range)     Initial Impression / Assessment and Plan / ED Course  I have reviewed the triage vital signs and the nursing notes.  Pertinent labs & imaging results that were available during my care of the patient were reviewed by me and considered in my medical decision making (see chart for details).     Patient seen and examined.  Well-appearing patient with cough and sore throat.  Sore throat seems to be worsening.  Symptoms ongoing for a week.  He does have some exudates.  Will treat with IM Bicillin.  He has had some improvement with albuterol inhaler in the past and he will be discharged with another inhaler for symptomatic  treatment.  Vital signs reviewed and are as follows: BP 133/85 (BP Location: Left Arm)   Pulse 82   Temp 98.3 F (36.8 C) (Oral)   Resp 16   Ht 5\' 11"  (1.803 m)   Wt 117.9 kg   SpO2 100%   BMI 36.26 kg/m   Patient counseled on supportive care and s/s to return including worsening symptoms, persistent fever, persistent vomiting, or if they have any other concerns. Urged to see PCP if symptoms persist for more than 3 days. Patient  verbalizes understanding and agrees with plan.    Final Clinical Impressions(s) / ED Diagnoses   Final diagnoses:  Pharyngitis, unspecified etiology  Cough   Patient with cough and sore throat.  Possible strep given clinical exam.  Vitals are stable, reported fever, afebrile here. No signs of dehydration. Lung exam normal, no signs of pneumonia. Supportive therapy indicated with return if symptoms worsen.     ED Discharge Orders    None       Renne Crigler, Cordelia Poche 06/01/18 9485    Azalia Bilis, MD 06/01/18 [phone]

## 2018-06-01 NOTE — Discharge Instructions (Signed)
Please read and follow all provided instructions.  Your diagnoses today include:  1. Pharyngitis, unspecified etiology   2. Cough     Tests performed today include:  Vital signs. See below for your results today.   Medications prescribed:  You were given a one-time shot of penicillin to treat strep throat.    Albuterol inhaler - medication that opens up your airway  Use inhaler as follows: 1-2 puffs with spacer every 4 hours as needed for wheezing, cough, or shortness of breath.   Take any medications prescribed only as directed.   Home care instructions:  Please read the educational materials provided and follow any instructions contained in this packet.  Follow-up instructions: Please follow-up with your primary care provider as needed for further evaluation of your symptoms.  Return instructions:   Please return to the Emergency Department if you experience worsening symptoms.   Return if you have worsening problems swallowing, your neck becomes swollen, you cannot swallow your saliva or your voice becomes muffled.   Return with high persistent fever, persistent vomiting, or if you have trouble breathing.   Please return if you have any other emergent concerns.  Additional Information:  Your vital signs today were: BP 133/85 (BP Location: Left Arm)    Pulse 82    Temp 98.3 F (36.8 C) (Oral)    Resp 16    Ht 5\' 11"  (1.803 m)    Wt 117.9 kg    SpO2 100%    BMI 36.26 kg/m  If your blood pressure (BP) was elevated above 135/85 this visit, please have this repeated by your doctor within one month. --------------

## 2018-06-01 NOTE — ED Triage Notes (Signed)
Pt states he has a sore throat that "feels like glass'. Pt states that he also feels like bronchitis, which he has had in the past. Pt reports pain with deep breaths. Pt states a non productive cough.

## 2018-06-02 ENCOUNTER — Encounter (HOSPITAL_COMMUNITY): Payer: Self-pay | Admitting: Emergency Medicine

## 2018-06-02 MED ORDER — DEXAMETHASONE SODIUM PHOSPHATE 10 MG/ML IJ SOLN
10.0000 mg | Freq: Once | INTRAMUSCULAR | Status: AC
  Administered 2018-06-02: 10 mg via INTRAMUSCULAR
  Filled 2018-06-02: qty 1

## 2018-06-02 MED ORDER — DIPHENHYDRAMINE HCL 25 MG PO CAPS
50.0000 mg | ORAL_CAPSULE | Freq: Once | ORAL | Status: AC
  Administered 2018-06-02: 50 mg via ORAL
  Filled 2018-06-02: qty 2

## 2018-06-02 MED ORDER — AEROCHAMBER PLUS FLO-VU MISC
1.0000 | Freq: Once | Status: AC
  Administered 2018-06-02: 1
  Filled 2018-06-02: qty 1

## 2018-06-02 NOTE — ED Provider Notes (Signed)
[facility]-EMERGENCY DEPT Provider Note: Jacob Dell, MD, FACEP  CSN: [id number] MRN: [id number] ARRIVAL: 06/01/18 at 1952 ROOM: WA24/WA24   CHIEF COMPLAINT  Chest Pain   HISTORY OF PRESENT ILLNESS  06/02/18 12:15 AM [name]. is a 28 y.o. male who was treated for pharyngitis yesterday morning and given a shot of Bicillin LA and an albuterol inhaler.  After he went home he got very sleepy.  Throughout the day he had sharp anterior chest pains that came and went.  He also continues to have shortness of breath which he states the albuterol inhaler is not relieving.  He has had no rash, no itching, no throat swelling, no vomiting or diarrhea.    Past Medical History:  Diagnosis Date  . Bronchitis   . Cellulitis of right hand - RECURRENT   . Kidney stones     Past Surgical History:  Procedure Laterality Date  . CARPAL TUNNEL RELEASE Left 04/09/2016   Procedure: CARPAL TUNNEL RELEASE;  Surgeon: Cammy Copa, MD;  Location: [facility] OR;  Service: Orthopedics;  Laterality: Left;  . HAND SURGERY    . KIDNEY STONE SURGERY     lithortripsy    Family History  Problem Relation Age of Onset  . Hypertension Other   . Diabetes Father     Social History   Tobacco Use  . Smoking status: Former Games developer  . Smokeless tobacco: Never Used  Substance Use Topics  . Alcohol use: No    Comment: occasionally   . Drug use: No    Prior to Admission medications   Medication Sig Start Date End Date Taking? Authorizing Provider  albuterol (PROVENTIL HFA;VENTOLIN HFA) 108 (90 Base) MCG/ACT inhaler Inhale 1-2 puffs into the lungs every 6 (six) hours as needed for wheezing or shortness of breath.   Yes [provider]  ondansetron (ZOFRAN) 4 MG tablet Take 1 tablet (4 mg total) by mouth every 8 (eight) hours as needed for nausea or vomiting. Patient not taking: Reported on 06/01/2018 03/29/18   Couture, Cortni S, PA-C    Allergies Coconut flavor; Fentanyl; and  Penicillins   REVIEW OF SYSTEMS  Negative except as noted here or in the History of Present Illness.   PHYSICAL EXAMINATION  Initial Vital Signs Blood pressure 133/90, pulse (!) 105, temperature 98.4 F (36.9 C), temperature source Oral, resp. rate 14, SpO2 97 %.  Examination General: Well-developed, well-nourished male in no acute distress; appearance consistent with age of record HENT: normocephalic; atraumatic; pharyngeal erythema without exudate or significant tonsillar enlargement Eyes: pupils equal, round and reactive to light; extraocular muscles intact Neck: supple Heart: regular rate and rhythm Lungs: clear to auscultation bilaterally Abdomen: soft; nondistended; nontender; bowel sounds present Extremities: No deformity; full range of motion; pulses normal Neurologic: Awake, alert and oriented; motor function intact in all extremities and symmetric; no facial droop Skin: Warm and dry Psychiatric: Normal mood and affect   RESULTS  Summary of this visit's results, reviewed by myself:   EKG Interpretation  Date/Time:  Monday June 01 2018 20:03:36 EST Ventricular Rate:  104 PR Interval:    QRS Duration: 83 QT Interval:  301 QTC Calculation: 396 R Axis:   43 Text Interpretation:  Sinus tachycardia Low voltage, precordial leads Confirmed by Geoffery Lyons ([id number]) on 06/01/2018 9:46:59 PM      Laboratory Studies: Results for orders placed or performed during the hospital encounter of 06/01/18 (from the past 24 hour(s))  CBC with Differential  Status: Abnormal   Collection Time: 06/01/18  8:17 PM  Result Value Ref Range   WBC 10.9 (H) 4.0 - 10.5 K/uL   RBC 5.26 4.22 - 5.81 MIL/uL   Hemoglobin 15.2 13.0 - 17.0 g/dL   HCT 81.146.0 91.439.0 - 78.252.0 %   MCV 87.5 80.0 - 100.0 fL   MCH 28.9 26.0 - 34.0 pg   MCHC 33.0 30.0 - 36.0 g/dL   RDW 95.613.2 21.311.5 - 08.615.5 %   Platelets 321 150 - 400 K/uL   nRBC 0.0 0.0 - 0.2 %   Neutrophils Relative % 58 %   Neutro Abs 6.4 1.7 - 7.7  K/uL   Lymphocytes Relative 30 %   Lymphs Abs 3.3 0.7 - 4.0 K/uL   Monocytes Relative 7 %   Monocytes Absolute 0.7 0.1 - 1.0 K/uL   Eosinophils Relative 4 %   Eosinophils Absolute 0.4 0.0 - 0.5 K/uL   Basophils Relative 0 %   Basophils Absolute 0.0 0.0 - 0.1 K/uL   Immature Granulocytes 1 %   Abs Immature Granulocytes 0.05 0.00 - 0.07 K/uL  Basic metabolic panel     Status: Abnormal   Collection Time: 06/01/18  8:17 PM  Result Value Ref Range   Sodium 139 135 - 145 mmol/L   Potassium 3.7 3.5 - 5.1 mmol/L   Chloride 105 98 - 111 mmol/L   CO2 23 22 - 32 mmol/L   Glucose, Bld 126 (H) 70 - 99 mg/dL   BUN 11 6 - 20 mg/dL   Creatinine, Ser 5.781.05 0.61 - 1.24 mg/dL   Calcium 9.0 8.9 - 46.910.3 mg/dL   GFR calc non Af Amer >60 >60 mL/min   GFR calc Af Amer >60 >60 mL/min   Anion gap 11 5 - 15  I-Stat Troponin, ED (not [facility])     Status: None   Collection Time: 06/01/18  8:21 PM  Result Value Ref Range   Troponin i, poc 0.00 0.00 - 0.08 ng/mL   Comment 3           Imaging Studies: No results found.  ED COURSE and MDM  Nursing notes and initial vitals signs, including pulse oximetry, reviewed.  Vitals:   06/01/18 2005  BP: 133/90  Pulse: (!) 105  Resp: 14  Temp: 98.4 F (36.9 C)  TempSrc: Oral  SpO2: 97%   12:30 AM Patient's symptoms are more consistent with a viral respiratory illness then with an acute allergic reaction.  He is showing no signs of anaphylaxis.  He has no rash or throat swelling.  As a precaution we will give him Benadryl and a dose of dexamethasone.  We will also get him an AeroChamber for his inhaler.   PROCEDURES    ED DIAGNOSES     ICD-10-CM   1. Atypical chest pain R07.89   2. Respiratory illness J98.9   3. Sore throat J02.9        Ilhan Madan, MD 06/02/18 [phone]

## 2018-07-15 DIAGNOSIS — M19012 Primary osteoarthritis, left shoulder: Secondary | ICD-10-CM | POA: Insufficient documentation

## 2018-09-20 ENCOUNTER — Encounter (HOSPITAL_COMMUNITY): Payer: Self-pay | Admitting: *Deleted

## 2018-09-20 ENCOUNTER — Emergency Department (HOSPITAL_COMMUNITY)
Admission: EM | Admit: 2018-09-20 | Discharge: 2018-09-20 | Disposition: A | Discharge status: Home / Self Care | Payer: 59 | Attending: Emergency Medicine | Admitting: Emergency Medicine

## 2018-09-20 ENCOUNTER — Encounter (HOSPITAL_COMMUNITY): Payer: Self-pay

## 2018-09-20 ENCOUNTER — Other Ambulatory Visit: Payer: Self-pay

## 2018-09-20 ENCOUNTER — Emergency Department (HOSPITAL_COMMUNITY): Payer: 59

## 2018-09-20 DIAGNOSIS — Z87891 Personal history of nicotine dependence: Secondary | ICD-10-CM | POA: Insufficient documentation

## 2018-09-20 DIAGNOSIS — M25511 Pain in right shoulder: Secondary | ICD-10-CM | POA: Diagnosis not present

## 2018-09-20 DIAGNOSIS — R509 Fever, unspecified: Secondary | ICD-10-CM | POA: Insufficient documentation

## 2018-09-20 DIAGNOSIS — Z20828 Contact with and (suspected) exposure to other viral communicable diseases: Secondary | ICD-10-CM | POA: Diagnosis not present

## 2018-09-20 DIAGNOSIS — M542 Cervicalgia: Secondary | ICD-10-CM | POA: Insufficient documentation

## 2018-09-20 LAB — CBC WITH DIFFERENTIAL/PLATELET
Abs Immature Granulocytes: 0.03 10*3/uL (ref 0.00–0.07)
Basophils Absolute: 0 10*3/uL (ref 0.0–0.1)
Basophils Relative: 0 %
Eosinophils Absolute: 0.2 10*3/uL (ref 0.0–0.5)
Eosinophils Relative: 1 %
HCT: 46.1 % (ref 39.0–52.0)
Hemoglobin: 15.1 g/dL (ref 13.0–17.0)
Immature Granulocytes: 0 %
Lymphocytes Relative: 22 %
Lymphs Abs: 2.5 10*3/uL (ref 0.7–4.0)
MCH: 29 pg (ref 26.0–34.0)
MCHC: 32.8 g/dL (ref 30.0–36.0)
MCV: 88.5 fL (ref 80.0–100.0)
Monocytes Absolute: 1.1 10*3/uL — ABNORMAL HIGH (ref 0.1–1.0)
Monocytes Relative: 9 %
Neutro Abs: 7.7 10*3/uL (ref 1.7–7.7)
Neutrophils Relative %: 68 %
Platelets: 317 10*3/uL (ref 150–400)
RBC: 5.21 MIL/uL (ref 4.22–5.81)
RDW: 13.3 % (ref 11.5–15.5)
WBC: 11.5 10*3/uL — ABNORMAL HIGH (ref 4.0–10.5)
nRBC: 0 % (ref 0.0–0.2)

## 2018-09-20 LAB — BASIC METABOLIC PANEL
Anion gap: 8 (ref 5–15)
BUN: 10 mg/dL (ref 6–20)
CO2: 26 mmol/L (ref 22–32)
Calcium: 8.8 mg/dL — ABNORMAL LOW (ref 8.9–10.3)
Chloride: 102 mmol/L (ref 98–111)
Creatinine, Ser: 1.06 mg/dL (ref 0.61–1.24)
GFR calc Af Amer: >60 mL/min (ref >60)
GFR calc non Af Amer: >60 mL/min (ref >60)
Glucose, Bld: 110 mg/dL — ABNORMAL HIGH (ref 70–99)
Potassium: 3.7 mmol/L (ref 3.5–5.1)
Sodium: 136 mmol/L (ref 135–145)

## 2018-09-20 LAB — CK: Total CK: 67 U/L (ref 49–397)

## 2018-09-20 MED ORDER — DOXYCYCLINE HYCLATE 100 MG PO CAPS: 100.0000 mg | ORAL_CAPSULE | Freq: Two times a day (BID) | ORAL | 0 refills | Status: DC

## 2018-09-20 MED ORDER — IBUPROFEN 600 MG PO TABS: 600.0000 mg | ORAL_TABLET | Freq: Four times a day (QID) | ORAL | 0 refills | Status: DC | PRN

## 2018-09-20 MED ORDER — IOHEXOL 300 MG/ML  SOLN
100.0000 mL | Freq: Once | INTRAMUSCULAR | Status: AC | PRN
  Administered 2018-09-20: 20:00:00 100 mL via INTRAVENOUS

## 2018-09-20 MED ORDER — SODIUM CHLORIDE (PF) 0.9 % IJ SOLN
INTRAMUSCULAR | Status: AC
  Filled 2018-09-20: qty 50

## 2018-09-20 NOTE — Discharge Instructions (Addendum)
Continue your doxycycline as prescribed. Follow the home isolation guidelines listed below. Return for any new or worsening symptoms.     Person Under Monitoring Name: Jacob Morasdrian L Cavitt Jr.  Location: [address] [place] [address]   Infection Prevention Recommendations for Individuals Confirmed to have, or Being Evaluated for, 2019 Novel Coronavirus (COVID-19) Infection Who Receive Care at Home  Individuals who are confirmed to have, or are being evaluated for, COVID-19 should follow the prevention steps below until a healthcare provider or local or state health department says they can return to normal activities.  Stay home except to get medical care You should restrict activities outside your home, except for getting medical care. Do not go to work, school, or public areas, and do not use public transportation or taxis.  Call ahead before visiting your doctor Before your medical appointment, call the healthcare provider and tell them that you have, or are being evaluated for, COVID-19 infection. This will help the healthcare providers office take steps to keep other people from getting infected. Ask your healthcare provider to call the local or state health department.  Monitor your symptoms Seek prompt medical attention if your illness is worsening (e.g., difficulty breathing). Before going to your medical appointment, call the healthcare provider and tell them that you have, or are being evaluated for, COVID-19 infection. Ask your healthcare provider to call the local or state health department.  Wear a facemask You should wear a facemask that covers your nose and mouth when you are in the same room with other people and when you visit a healthcare provider. People who live with or visit you should also wear a facemask while they are in the same room with you.  Separate yourself from other people in your home As much as possible, you should stay in a different  room from other people in your home. Also, you should use a separate bathroom, if available.  Avoid sharing household items You should not share dishes, drinking glasses, cups, eating utensils, towels, bedding, or other items with other people in your home. After using these items, you should wash them thoroughly with soap and water.  Cover your coughs and sneezes Cover your mouth and nose with a tissue when you cough or sneeze, or you can cough or sneeze into your sleeve. Throw used tissues in a lined trash can, and immediately wash your hands with soap and water for at least 20 seconds or use an alcohol-based hand rub.  Wash your [employer] Pacific Corporationhands Wash your hands often and thoroughly with soap and water for at least 20 seconds. You can use an alcohol-based hand sanitizer if soap and water are not available and if your hands are not visibly dirty. Avoid touching your eyes, nose, and mouth with unwashed hands.   Prevention Steps for Caregivers and Household Members of Individuals Confirmed to have, or Being Evaluated for, COVID-19 Infection Being Cared for in the Home  If you live with, or provide care at home for, a person confirmed to have, or being evaluated for, COVID-19 infection please follow these guidelines to prevent infection:  Follow healthcare providers instructions Make sure that you understand and can help the patient follow any healthcare provider instructions for all care.  Provide for the patients basic needs You should help the patient with basic needs in the home and provide support for getting groceries, prescriptions, and other personal needs.  Monitor the patients symptoms If they are getting sicker, call his or her  medical provider and tell them that the patient has, or is being evaluated for, COVID-19 infection. This will help the healthcare providers office take steps to keep other people from getting infected. Ask the healthcare provider to call the local or state  health department.  Limit the number of people who have contact with the patient If possible, have only one caregiver for the patient. Other household members should stay in another home or place of residence. If this is not possible, they should stay in another room, or be separated from the patient as much as possible. Use a separate bathroom, if available. Restrict visitors who do not have an essential need to be in the home.  Keep older adults, very young children, and other sick people away from the patient Keep older adults, very young children, and those who have compromised immune systems or chronic health conditions away from the patient. This includes people with chronic heart, lung, or kidney conditions, diabetes, and cancer.  Ensure good ventilation Make sure that shared spaces in the home have good air flow, such as from an air conditioner or an opened window, weather permitting.  Wash your hands often Wash your hands often and thoroughly with soap and water for at least 20 seconds. You can use an alcohol based hand sanitizer if soap and water are not available and if your hands are not visibly dirty. Avoid touching your eyes, nose, and mouth with unwashed hands. Use disposable paper towels to dry your hands. If not available, use dedicated cloth towels and replace them when they become wet.  Wear a facemask and gloves Wear a disposable facemask at all times in the room and gloves when you touch or have contact with the patients blood, body fluids, and/or secretions or excretions, such as sweat, saliva, sputum, nasal mucus, vomit, urine, or feces.  Ensure the mask fits over your nose and mouth tightly, and do not touch it during use. Throw out disposable facemasks and gloves after using them. Do not reuse. Wash your hands immediately after removing your facemask and gloves. If your personal clothing becomes contaminated, carefully remove clothing and launder. Wash your hands  after handling contaminated clothing. Place all used disposable facemasks, gloves, and other waste in a lined container before disposing them with other household waste. Remove gloves and wash your hands immediately after handling these items.  Do not share dishes, glasses, or other household items with the patient Avoid sharing household items. You should not share dishes, drinking glasses, cups, eating utensils, towels, bedding, or other items with a patient who is confirmed to have, or being evaluated for, COVID-19 infection. After the person uses these items, you should wash them thoroughly with soap and water.  Wash laundry thoroughly Immediately remove and wash clothes or bedding that have blood, body fluids, and/or secretions or excretions, such as sweat, saliva, sputum, nasal mucus, vomit, urine, or feces, on them. Wear gloves when handling laundry from the patient. Read and follow directions on labels of laundry or clothing items and detergent. In general, wash and dry with the warmest temperatures recommended on the label.  Clean all areas the individual has used often Clean all touchable surfaces, such as counters, tabletops, doorknobs, bathroom fixtures, toilets, phones, keyboards, tablets, and bedside tables, every day. Also, clean any surfaces that may have blood, body fluids, and/or secretions or excretions on them. Wear gloves when cleaning surfaces the patient has come in contact with. Use a diluted bleach solution (e.g., dilute bleach with  1 part bleach and 10 parts water) or a household disinfectant with a label that says EPA-registered for coronaviruses. To make a bleach solution at home, add 1 tablespoon of bleach to 1 quart (4 cups) of water. For a larger supply, add  cup of bleach to 1 gallon (16 cups) of water. Read labels of cleaning products and follow recommendations provided on product labels. Labels contain instructions for safe and effective use of the cleaning product  including precautions you should take when applying the product, such as wearing gloves or eye protection and making sure you have good ventilation during use of the product. Remove gloves and wash hands immediately after cleaning.  Monitor yourself for signs and symptoms of illness Caregivers and household members are considered close contacts, should monitor their health, and will be asked to limit movement outside of the home to the extent possible. Follow the monitoring steps for close contacts listed on the symptom monitoring form.   ? If you have additional questions, contact your local health department or call the epidemiologist on call at [phone] (available 24/7). ? This guidance is subject to change. For the most up-to-date guidance from [facility], please refer to their website: YouBlogs.pl

## 2018-09-20 NOTE — ED Triage Notes (Addendum)
Pt was seen here earlier today. He called back and spoke with CN regarding fever elevating and his pain in his neck rt shoulder. No other symptoms. Pt does have history of cellulitis in the arm and hand on rt side and states symptoms feel similar.

## 2018-09-20 NOTE — ED Provider Notes (Signed)
[facility]-EMERGENCY DEPT Provider Note   CSN: [id number] Arrival date & time: 09/20/18  1130    History   Chief Complaint Chief Complaint  Patient presents with  . Neck Pain    HPI Jacob Nixon. is a 28 y.o. male.     The history is provided by the patient. No language interpreter was used.  Neck Pain     28 year old male presenting for evaluation of neck pain.  Patient report for the past 5 days he has had persistent sharp throbbing pain to the right side of his neck.  Pain is worse with palpation or with neck movement.  2 days ago he also felt feverish and recorded an oral temperature of 101.  He has been taking ibuprofen at home with minimal improvement.  He also has tried pain patch and icy hot without relief.  He felt that the pain is similar to prior cellulitis of his right hand.  He denies any significant headache, ear pain, trouble hearing, runny nose sneezing or coughing, sore throat, trouble swallowing, chest pain or trouble breathing.  He denies any scalp tenderness.  No complaints of arm numbness or weakness or any focal neuro deficit.  Past Medical History:  Diagnosis Date  . Bronchitis   . Cellulitis of right hand - RECURRENT   . Kidney stones     Patient Active Problem List   Diagnosis Date Noted  . Hyponatremia 10/26/2016  . Hypokalemia 10/26/2016  . Elevated glucose level   . Cellulitis of hand 08/04/2016  . Carpal tunnel syndrome on left 05/08/2016  . Abscess of upper arm   . Cellulitis 01/22/2016  . Leukocytosis 01/22/2016  . Neuropathy 01/22/2016  . Nephrolithiasis 01/22/2016  . Cellulitis of right hand   . Cellulitis of forearm, right 07/30/2015    Past Surgical History:  Procedure Laterality Date  . CARPAL TUNNEL RELEASE Left 04/09/2016   Procedure: CARPAL TUNNEL RELEASE;  Surgeon: Cammy Copa, MD;  Location: [facility] OR;  Service: Orthopedics;  Laterality: Left;  . HAND SURGERY    . KIDNEY STONE SURGERY     lithortripsy        Home Medications    Prior to Admission medications   Medication Sig Start Date End Date Taking? Authorizing Provider  albuterol (PROVENTIL HFA;VENTOLIN HFA) 108 (90 Base) MCG/ACT inhaler Inhale 1-2 puffs into the lungs every 6 (six) hours as needed for wheezing or shortness of breath.    [provider]    Family History Family History  Problem Relation Age of Onset  . Hypertension Other   . Diabetes Father     Social History Social History   Tobacco Use  . Smoking status: Former Games developer  . Smokeless tobacco: Never Used  Substance Use Topics  . Alcohol use: Yes    Comment: occasionally   . Drug use: No     Allergies   Coconut flavor; Fentanyl; and Penicillins   Review of Systems Review of Systems  Musculoskeletal: Positive for neck pain.  All other systems reviewed and are negative.    Physical Exam Updated Vital Signs BP 126/83 (BP Location: Right Arm)   Pulse 98   Temp 99.3 F (37.4 C) (Oral)   Resp 20   Ht 5\' 11"  (1.803 m)   Wt 117.9 kg   SpO2 97%   BMI 36.26 kg/m   Physical Exam Vitals signs and nursing note reviewed.  Constitutional:      General: He is not in acute  distress.    Appearance: He is well-developed.  HENT:     Head: Atraumatic.     Comments: Ears: TMs normal bilaterally Nose: Normal nares Throat: Uvula midline no tonsillar lodgment no exudate, no trismus Eyes:     Conjunctiva/sclera: Conjunctivae normal.  Neck:     Musculoskeletal: Normal range of motion and neck supple. Muscular tenderness (Tenderness along right lateral neck with mild fullness but no surrounding skin erythema or warmth noted.  No carotid bruit.) present. No neck rigidity.     Vascular: No carotid bruit.  Cardiovascular:     Rate and Rhythm: Normal rate and regular rhythm.     Pulses: Normal pulses.     Heart sounds: Normal heart sounds.  Pulmonary:     Breath sounds: Normal breath sounds.  Lymphadenopathy:     Cervical: No  cervical adenopathy.  Skin:    Capillary Refill: Capillary refill takes less than 2 seconds.     Findings: No rash.  Neurological:     Mental Status: He is alert and oriented to person, place, and time.      ED Treatments / Results  Labs (all labs ordered are listed, but only abnormal results are displayed) Labs Reviewed - No data to display  EKG None  Radiology No results found.  Procedures Procedures (including critical care time)  Medications Ordered in ED Medications - No data to display   Initial Impression / Assessment and Plan / ED Course  I have reviewed the triage vital signs and the nursing notes.  Pertinent labs & imaging results that were available during my care of the patient were reviewed by me and considered in my medical decision making (see chart for details).        BP 126/83 (BP Location: Right Arm)   Pulse 98   Temp 99.3 F (37.4 C) (Oral)   Resp 20   Ht 5\' 11"  (1.803 m)   Wt 117.9 kg   SpO2 97%   BMI 36.26 kg/m    Final Clinical Impressions(s) / ED Diagnoses   Final diagnoses:  Neck pain    ED Discharge Orders         Ordered    doxycycline (VIBRAMYCIN) 100 MG capsule  2 times daily     09/20/18 1336    ibuprofen (ADVIL) 600 MG tablet  Every 6 hours PRN     09/20/18 1336         1:34 PM Patient complaining of pain to his neck on the right side.  Area is tender to palpation with mild fullness but without any significant obvious signs of cellulitis.  He mentioned that the pain feels similar to prior skin infection.  He does not have any symptoms to suggest COVID-19.  He is afebrile here.  He is well-appearing.  No evidence of mastoiditis.  No focal numbness or weakness to suggest vertebral artery dissection.  Plan to discharge home with anti-inflammatory medication along with doxycycline for potential infection.  No appreciable lymphadenopathy noted.  Return precaution discussed.  Patient is well-appearing.  He has no nuchal  rigidity concerning for meningitis.   Fayrene Helperran, Shane Melby, PA-C 09/20/18 1338    Gwyneth SproutPlunkett, Whitney, MD 09/20/18 [phone]

## 2018-09-20 NOTE — ED Notes (Signed)
Bed: WTR8 Expected date:  Expected time:  Means of arrival:  Comments: 

## 2018-09-20 NOTE — Discharge Instructions (Signed)
Your neck pain may be due to infection. Take antibiotic as prescribed. If your condition worsen please return for reevaluation.

## 2018-09-20 NOTE — ED Triage Notes (Addendum)
Patient reports that he was at work 4 days ago and began having right lateral neck pain. Patient states the pain has been worsening. Patient states he has been using OTC meds including pain patches and icy hot with no relief.  Patient states he had a fever 100.4, but no other symptoms 2 days ago.

## 2018-09-20 NOTE — ED Notes (Signed)
Pt called with questions regarding his symptoms and discharge instructions. Pt states his symptoms are worse and he now has fever 100.5. He will return for evaluation.

## 2018-09-20 NOTE — ED Provider Notes (Signed)
[facility]-EMERGENCY DEPT Provider Note   CSN: [id number] Arrival date & time: 09/20/18  1737    History   Chief Complaint Chief Complaint  Patient presents with  . Fever    HPI Jacob Morasdrian L Cumberledge Jr. is a 28 y.o. male.  Return to the emergency department after previous evaluation about 1 PM for pain in the right shoulder and fever.  Patient states that he has a significant history of spontaneous cellulitis of the right hand and arm.  He is complaining of shooting pain in his right shoulder region.  He has been running fevers at home.  Denies shortness of breath, cough or exposure to others with known coronavirus.  He is concerned that this is a flareup of his cellulitis.  He did not pick up his doxycycline and begin taking it.  He states that he returned because his fever came back.  He states that he has had a history of long-term hospitalizations for his cellulitis including a 2-week stay at [facility] in the past.  He denies urinary symptoms back pain.     HPI  Past Medical History:  Diagnosis Date  . Bronchitis   . Cellulitis of right hand - RECURRENT   . Kidney stones     Patient Active Problem List   Diagnosis Date Noted  . Hyponatremia 10/26/2016  . Hypokalemia 10/26/2016  . Elevated glucose level   . Cellulitis of hand 08/04/2016  . Carpal tunnel syndrome on left 05/08/2016  . Abscess of upper arm   . Cellulitis 01/22/2016  . Leukocytosis 01/22/2016  . Neuropathy 01/22/2016  . Nephrolithiasis 01/22/2016  . Cellulitis of right hand   . Cellulitis of forearm, right 07/30/2015    Past Surgical History:  Procedure Laterality Date  . CARPAL TUNNEL RELEASE Left 04/09/2016   Procedure: CARPAL TUNNEL RELEASE;  Surgeon: Cammy CopaScott Gregory [name], MD;  [facility] OR;  Service: Orthopedics;  Laterality: Left;  . HAND SURGERY    . KIDNEY STONE SURGERY     lithortripsy        Home Medications    Prior to Admission medications   Medication Sig  Start Date End Date Taking? Authorizing Provider  albuterol (PROVENTIL HFA;VENTOLIN HFA) 108 (90 Base) MCG/ACT inhaler Inhale 1-2 puffs into the lungs every 6 (six) hours as needed for wheezing or shortness of breath.    [provider]  doxycycline (VIBRAMYCIN) 100 MG capsule Take 1 capsule (100 mg total) by mouth 2 (two) times daily. One po bid x 7 days 09/20/18   Fayrene Helperran, [place], PA-C  ibuprofen (ADVIL) 600 MG tablet Take 1 tablet (600 mg total) by mouth every 6 (six) hours as needed. 09/20/18   Fayrene Helperran, [place], PA-C    Family History Family History  Problem Relation Age of Onset  . Hypertension Other   . Diabetes Father     Social History Social History   Tobacco Use  . Smoking status: Former Games developermoker  . Smokeless tobacco: Never Used  Substance Use Topics  . Alcohol use: Yes    Comment: occasionally   . Drug use: No     Allergies   Coconut flavor; Fentanyl; and Penicillins   Review of Systems Review of Systems  Ten systems reviewed and are negative for acute change, except as noted in the HPI.   Physical Exam Updated Vital Signs BP (!) 143/91   Pulse (!) 114   Temp 99.2 F (37.3 C) (Oral)   Resp 15   Ht 5\' 11"  (  1.803 m)   Wt 117.9 kg   SpO2 100%   BMI 36.25 kg/m   Physical Exam Vitals signs and nursing note reviewed.  Constitutional:      General: He is not in acute distress.    Appearance: He is well-developed. He is not diaphoretic.  HENT:     Head: Normocephalic and atraumatic.  Eyes:     General: No scleral icterus.    Conjunctiva/sclera: Conjunctivae normal.  Neck:     Musculoskeletal: Normal range of motion and neck supple.  Cardiovascular:     Rate and Rhythm: Normal rate and regular rhythm.     Heart sounds: Normal heart sounds.  Pulmonary:     Effort: Pulmonary effort is normal. No respiratory distress.     Breath sounds: Normal breath sounds.  Abdominal:     Palpations: Abdomen is soft.     Tenderness: There is no abdominal tenderness.   Musculoskeletal:     Comments: TTP R trapezius No obvious swelling, Erythema or signs of cellulitis R neck non tender, no signs of infection, no fullness, no adenopathy.  Skin:    General: Skin is warm and dry.  Neurological:     Mental Status: He is alert.  Psychiatric:        Behavior: Behavior normal.      ED Treatments / Results  Labs (all labs ordered are listed, but only abnormal results are displayed) Labs Reviewed  BASIC METABOLIC PANEL - Abnormal; Notable for the following components:      Result Value   Glucose, Bld 110 (*)    Calcium 8.8 (*)    All other components within normal limits  CBC WITH DIFFERENTIAL/PLATELET - Abnormal; Notable for the following components:   WBC 11.5 (*)    Monocytes Absolute 1.1 (*)    All other components within normal limits  CK    EKG None  Radiology Ct Soft Tissue Neck W Contrast  Result Date: 09/20/2018 CLINICAL DATA:  28 y/o M; right-sided neck pain, tenderness to palpation, and fever. EXAM: CT NECK WITH CONTRAST TECHNIQUE: Multidetector CT imaging of the neck was performed using the standard protocol following the bolus administration of intravenous contrast. CONTRAST:  OMNIPAQUE IOHEXOL 300 MG/ML  SOLN COMPARISON:  None. FINDINGS: Pharynx and larynx: Normal. No mass or swelling. Salivary glands: No inflammation, mass, or stone. Thyroid: Normal. Lymph nodes: None enlarged or abnormal density. Vascular: Negative. Limited intracranial: Negative. Visualized orbits: Negative. Mastoids and visualized paranasal sinuses: Small mucous retention cysts are present within left posterior ethmoid air cells and the bilateral maxillary sinuses. No sinus fluid levels. Normal aeration of the mastoid air cells. 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 Skeleton: No acute or aggressive process. Upper chest: Negative. Other: 10 mm right-sided facial dermal cyst at level of mandibular angle. Dental disease with right mandibular  molar caries. IMPRESSION: 1. No mass or inflammatory process of the neck is identified. 2. Dental disease with right mandibular molar caries. 3. 10 mm right facial dermal cyst at level of mandibular angle. Electronically Signed   By: Mitzi Hansen M.D.   On: 09/20/2018 20:11    Procedures Procedures (including critical care time)  Medications Ordered in ED Medications  sodium chloride (PF) 0.9 % injection (has no administration in time range)  iohexol (OMNIPAQUE) 300 MG/ML solution 100 mL (100 mLs Intravenous Contrast Given 09/20/18 1935)     Initial Impression / Assessment and Plan / ED Course  I have reviewed the triage vital signs  and the nursing notes.  Pertinent labs & imaging results that were available during my care of the patient were reviewed by me and considered in my medical decision making (see chart for details).  Clinical Course as of Sep 19 2025  Sun Sep 20, 2018  1956 CK Total: 67 [AH]  1956 WBC(!): 11.5 [AH]    Clinical Course User Index [AH] Arthor Captain, PA-C       Patient here with c/o fever and R sided muscle pain.  He is afebrile here.  His white blood cell count is mildly elevated with a slightly elevated blood glucose at 110.  This may be acute phase reaction due to pain.  No elevated CK or evidence of myositis.  Personally reviewed the CT soft tissue of the neck which covered the upper trapezius and chest.  There is no evidence of inflammation or infection in the tissue.  Patient is encouraged to continue taking the doxycycline given his history.  Jacob Moras. was evaluated in Emergency Department on 09/20/2018 for the symptoms described in the history of present illness. He was evaluated in the context of the global COVID-19 pandemic, which necessitated consideration that the patient might be at risk for infection with the SARS-CoV-2 virus that causes COVID-19. Institutional protocols and algorithms that pertain to the evaluation of patients at  risk for COVID-19 are in a state of rapid change based on information released by regulatory bodies including the CDC and federal and state organizations. These policies and algorithms were followed during the patient's care in the ED Patient given home isolation and return precautions.  He appears appropriate for discharge at this time Final Clinical Impressions(s) / ED Diagnoses   Final diagnoses:  Right shoulder pain, unspecified chronicity  Fever, unspecified fever cause    ED Discharge Orders    None       Arthor Captain, PA-C 09/20/18 2233    Lorre Nick, MD 09/20/18 2238

## 2018-09-21 ENCOUNTER — Telehealth: Payer: Self-pay | Admitting: *Deleted

## 2018-09-21 NOTE — Telephone Encounter (Signed)
[facility] ED TOC CM -referral PCP   Pt in 09/20/2018 with 5 ED visits. Pt does not have PCP. Contacted pt to arrange PCP appt. Pt states he just started new job that will allow him insurance coverage after 30 days. He will locate a PCP. Provided pt with CM contact if he needs assistance arrange follow up with PCP.  Isidoro Donning RN CCM Case Mgmt phone [phone]

## 2018-10-11 ENCOUNTER — Emergency Department (HOSPITAL_COMMUNITY)
Admission: EM | Admit: 2018-10-11 | Discharge: 2018-10-11 | Disposition: A | Discharge status: Home / Self Care | Payer: Self-pay | Attending: Emergency Medicine | Admitting: Emergency Medicine

## 2018-10-11 ENCOUNTER — Emergency Department (HOSPITAL_COMMUNITY): Payer: Self-pay

## 2018-10-11 ENCOUNTER — Encounter (HOSPITAL_COMMUNITY): Payer: Self-pay

## 2018-10-11 ENCOUNTER — Other Ambulatory Visit: Payer: Self-pay

## 2018-10-11 DIAGNOSIS — R112 Nausea with vomiting, unspecified: Secondary | ICD-10-CM | POA: Insufficient documentation

## 2018-10-11 DIAGNOSIS — N23 Unspecified renal colic: Secondary | ICD-10-CM | POA: Diagnosis not present

## 2018-10-11 DIAGNOSIS — Z87891 Personal history of nicotine dependence: Secondary | ICD-10-CM | POA: Insufficient documentation

## 2018-10-11 DIAGNOSIS — Z79899 Other long term (current) drug therapy: Secondary | ICD-10-CM | POA: Insufficient documentation

## 2018-10-11 LAB — URINALYSIS, ROUTINE W REFLEX MICROSCOPIC
Bilirubin Urine: NEGATIVE
Glucose, UA: NEGATIVE mg/dL
Ketones, ur: NEGATIVE mg/dL
Leukocytes,Ua: NEGATIVE
Nitrite: NEGATIVE
Protein, ur: NEGATIVE mg/dL
RBC / HPF: >50 RBC/hpf — ABNORMAL HIGH (ref 0–5)
Specific Gravity, Urine: 1.019 (ref 1.005–1.030)
pH: 6 (ref 5.0–8.0)

## 2018-10-11 LAB — CBC WITH DIFFERENTIAL/PLATELET
Abs Immature Granulocytes: 0.03 10*3/uL (ref 0.00–0.07)
Basophils Absolute: 0.1 10*3/uL (ref 0.0–0.1)
Basophils Relative: 1 %
Eosinophils Absolute: 0.3 10*3/uL (ref 0.0–0.5)
Eosinophils Relative: 3 %
HCT: 45.2 % (ref 39.0–52.0)
Hemoglobin: 14.9 g/dL (ref 13.0–17.0)
Immature Granulocytes: 0 %
Lymphocytes Relative: 36 %
Lymphs Abs: 3.7 10*3/uL (ref 0.7–4.0)
MCH: 28.5 pg (ref 26.0–34.0)
MCHC: 33 g/dL (ref 30.0–36.0)
MCV: 86.6 fL (ref 80.0–100.0)
Monocytes Absolute: 1 10*3/uL (ref 0.1–1.0)
Monocytes Relative: 10 %
Neutro Abs: 5.2 10*3/uL (ref 1.7–7.7)
Neutrophils Relative %: 50 %
Platelets: 369 10*3/uL (ref 150–400)
RBC: 5.22 MIL/uL (ref 4.22–5.81)
RDW: 13.3 % (ref 11.5–15.5)
WBC: 10.3 10*3/uL (ref 4.0–10.5)
nRBC: 0 % (ref 0.0–0.2)

## 2018-10-11 LAB — BASIC METABOLIC PANEL
Anion gap: 8 (ref 5–15)
BUN: 10 mg/dL (ref 6–20)
CO2: 24 mmol/L (ref 22–32)
Calcium: 8.8 mg/dL — ABNORMAL LOW (ref 8.9–10.3)
Chloride: 105 mmol/L (ref 98–111)
Creatinine, Ser: 0.97 mg/dL (ref 0.61–1.24)
GFR calc Af Amer: >60 mL/min (ref >60)
GFR calc non Af Amer: >60 mL/min (ref >60)
Glucose, Bld: 97 mg/dL (ref 70–99)
Potassium: 3.9 mmol/L (ref 3.5–5.1)
Sodium: 137 mmol/L (ref 135–145)

## 2018-10-11 MED ORDER — SODIUM CHLORIDE 0.9 % IV BOLUS
1000.0000 mL | Freq: Once | INTRAVENOUS | Status: AC
  Administered 2018-10-11: 1000 mL via INTRAVENOUS

## 2018-10-11 MED ORDER — MORPHINE SULFATE (PF) 4 MG/ML IV SOLN
4.0000 mg | Freq: Once | INTRAVENOUS | Status: AC
  Administered 2018-10-11: 4 mg via INTRAVENOUS
  Filled 2018-10-11: qty 1

## 2018-10-11 MED ORDER — ONDANSETRON HCL 4 MG/2ML IJ SOLN
4.0000 mg | Freq: Once | INTRAMUSCULAR | Status: AC
  Administered 2018-10-11: 05:00:00 4 mg via INTRAVENOUS
  Filled 2018-10-11: qty 2

## 2018-10-11 MED ORDER — OXYCODONE-ACETAMINOPHEN 5-325 MG PO TABS
1.0000 | ORAL_TABLET | Freq: Once | ORAL | Status: AC
  Administered 2018-10-11: 1 via ORAL
  Filled 2018-10-11: qty 1

## 2018-10-11 MED ORDER — KETOROLAC TROMETHAMINE 30 MG/ML IJ SOLN
15.0000 mg | Freq: Once | INTRAMUSCULAR | Status: AC
  Administered 2018-10-11: 15 mg via INTRAVENOUS
  Filled 2018-10-11: qty 1

## 2018-10-11 MED ORDER — TAMSULOSIN HCL 0.4 MG PO CAPS: 0.4000 mg | ORAL_CAPSULE | Freq: Every day | ORAL | 0 refills | Status: DC

## 2018-10-11 MED ORDER — OXYCODONE-ACETAMINOPHEN 5-325 MG PO TABS: 1.0000 | ORAL_TABLET | Freq: Four times a day (QID) | ORAL | 0 refills | Status: DC | PRN

## 2018-10-11 MED ORDER — ONDANSETRON 4 MG PO TBDP: 4.0000 mg | ORAL_TABLET | Freq: Three times a day (TID) | ORAL | 0 refills | Status: DC | PRN

## 2018-10-11 NOTE — ED Provider Notes (Signed)
[facility]-EMERGENCY DEPT Provider Note   CSN: [id number] Arrival date & time: 10/11/18  0400    History   Chief Complaint Chief Complaint  Patient presents with  . Flank Pain    left     HPI Jacob Nixon. is a 28 y.o. male.     HPI  This is a 28 year old male with a history of kidney stones who presents with left flank pain.  Patient reports acute onset of left flank pain.  He reports that it is sharp and nonradiating.  Currently his pain is 10 out of 10.  He reports associated nausea without vomiting.  He has not taken anything for the pain.  Nothing seems to make it better or worse.  The pain feels similar to prior kidney stones.  He denies any fevers, dysuria, hematuria, chest pain, shortness of breath.  Past Medical History:  Diagnosis Date  . Bronchitis   . Cellulitis of right hand - RECURRENT   . Kidney stones     Patient Active Problem List   Diagnosis Date Noted  . Hyponatremia 10/26/2016  . Hypokalemia 10/26/2016  . Elevated glucose level   . Cellulitis of hand 08/04/2016  . Carpal tunnel syndrome on left 05/08/2016  . Abscess of upper arm   . Cellulitis 01/22/2016  . Leukocytosis 01/22/2016  . Neuropathy 01/22/2016  . Nephrolithiasis 01/22/2016  . Cellulitis of right hand   . Cellulitis of forearm, right 07/30/2015    Past Surgical History:  Procedure Laterality Date  . CARPAL TUNNEL RELEASE Left 04/09/2016   Procedure: CARPAL TUNNEL RELEASE;  Surgeon: Cammy Copa, MD;  Location: [facility] OR;  Service: Orthopedics;  Laterality: Left;  . HAND SURGERY    . KIDNEY STONE SURGERY     lithortripsy        Home Medications    Prior to Admission medications   Medication Sig Start Date End Date Taking? Authorizing Provider  albuterol (PROVENTIL HFA;VENTOLIN HFA) 108 (90 Base) MCG/ACT inhaler Inhale 1-2 puffs into the lungs every 6 (six) hours as needed for wheezing or shortness of breath.    [provider]   doxycycline (VIBRAMYCIN) 100 MG capsule Take 1 capsule (100 mg total) by mouth 2 (two) times daily. One po bid x 7 days 09/20/18   Fayrene Helper, PA-C  ibuprofen (ADVIL) 600 MG tablet Take 1 tablet (600 mg total) by mouth every 6 (six) hours as needed. 09/20/18   Fayrene Helper, PA-C  ondansetron (ZOFRAN ODT) 4 MG disintegrating tablet Take 1 tablet (4 mg total) by mouth every 8 (eight) hours as needed for nausea or vomiting. 10/11/18   Jemari Hallum, Mayer Masker, MD  oxyCODONE-acetaminophen (PERCOCET/ROXICET) 5-325 MG tablet Take 1 tablet by mouth every 6 (six) hours as needed for severe pain. 10/11/18   Jeriel Vivanco, Mayer Masker, MD  tamsulosin (FLOMAX) 0.4 MG CAPS capsule Take 1 capsule (0.4 mg total) by mouth daily. 10/11/18   Linton Stolp, Mayer Masker, MD    Family History Family History  Problem Relation Age of Onset  . Hypertension Other   . Diabetes Father     Social History Social History   Tobacco Use  . Smoking status: Former Games developer  . Smokeless tobacco: Never Used  Substance Use Topics  . Alcohol use: Yes    Comment: occasionally   . Drug use: No     Allergies   Coconut flavor; Fentanyl; and Penicillins   Review of Systems Review of Systems  Constitutional: Negative for fever.  Respiratory: Negative for shortness of breath.   Cardiovascular: Negative for chest pain.  Gastrointestinal: Positive for nausea. Negative for abdominal pain and vomiting.  Genitourinary: Positive for flank pain. Negative for dysuria and hematuria.  Skin: Negative for rash.  Neurological: Negative for headaches.  All other systems reviewed and are negative.    Physical Exam Updated Vital Signs BP 132/81   Pulse 86   Temp 97.9 F (36.6 C)   Resp 14   Ht 1.803 m (5\' 11" )   Wt 117.9 kg   SpO2 98%   BMI 36.26 kg/m   Physical Exam Vitals signs and nursing note reviewed.  Constitutional:      Appearance: He is well-developed. He is obese. He is not ill-appearing.     Comments: Uncomfortable appearing but  nontoxic  HENT:     Head: Normocephalic and atraumatic.  Neck:     Musculoskeletal: Neck supple.  Cardiovascular:     Rate and Rhythm: Normal rate and regular rhythm.     Heart sounds: Normal heart sounds. No murmur.  Pulmonary:     Effort: Pulmonary effort is normal. No respiratory distress.     Breath sounds: Normal breath sounds. No wheezing.  Abdominal:     General: Bowel sounds are normal.     Palpations: Abdomen is soft.     Tenderness: There is no abdominal tenderness. There is left CVA tenderness. There is no rebound.  Musculoskeletal:     Right lower leg: No edema.     Left lower leg: No edema.  Lymphadenopathy:     Cervical: No cervical adenopathy.  Skin:    General: Skin is warm and dry.  Neurological:     Mental Status: He is alert and oriented to person, place, and time.  Psychiatric:        Mood and Affect: Mood normal.      ED Treatments / Results  Labs (all labs ordered are listed, but only abnormal results are displayed) Labs Reviewed  BASIC METABOLIC PANEL - Abnormal; Notable for the following components:      Result Value   Calcium 8.8 (*)    All other components within normal limits  URINALYSIS, ROUTINE W REFLEX MICROSCOPIC - Abnormal; Notable for the following components:   APPearance HAZY (*)    Hgb urine dipstick LARGE (*)    RBC / HPF >50 (*)    Bacteria, UA FEW (*)    All other components within normal limits  CBC WITH DIFFERENTIAL/PLATELET    EKG None  Radiology Dg Abdomen 1 View  Result Date: 10/11/2018 CLINICAL DATA:  28 year old male with history of left-sided flank pain that woke him from sleep. EXAM: ABDOMEN - 1 VIEW COMPARISON:  Abdominal radiograph 03/28/2018. FINDINGS: Gas and stool are seen scattered throughout the colon extending to the level of the distal rectum. No pathologic distension of small bowel is noted. No gross evidence of pneumoperitoneum. IMPRESSION: 1. Nonobstructive bowel gas pattern. 2. No pneumoperitoneum.  Electronically Signed   By: Trudie Reedaniel  Entrikin M.D.   On: 10/11/2018 05:35    Procedures Procedures (including critical care time)  Medications Ordered in ED Medications  morphine 4 MG/ML injection 4 mg (has no administration in time range)  sodium chloride 0.9 % bolus 1,000 mL (0 mLs Intravenous Stopped 10/11/18 0546)  morphine 4 MG/ML injection 4 mg (4 mg Intravenous Given 10/11/18 0439)  ondansetron (ZOFRAN) injection 4 mg (4 mg Intravenous Given 10/11/18 0437)  ketorolac (TORADOL) 30 MG/ML injection 15 mg (15 mg  Intravenous Given 10/11/18 0438)  morphine 4 MG/ML injection 4 mg (4 mg Intravenous Given 10/11/18 0545)  oxyCODONE-acetaminophen (PERCOCET/ROXICET) 5-325 MG per tablet 1 tablet (1 tablet Oral Given 10/11/18 1610)     Initial Impression / Assessment and Plan / ED Course  I have reviewed the triage vital signs and the nursing notes.  Pertinent labs & imaging results that were available during my care of the patient were reviewed by me and considered in my medical decision making (see chart for details).  Clinical Course as of Oct 11 642  Wynelle Link Oct 11, 2018  9604 On recheck, patient has had some improvement of pain.  Pain has migrated into the left lower quadrant.  He has hemoglobin in his urine.  While there are no radiopaque stones on his x-ray, highly suspicious for kidney stones.  I discussed this with the patient.  Given history compatible with kidney stones, would elect to forego CT imaging if patient is agreeable which he is.  Recommend supportive measures.   [CH]    Clinical Course User Index [CH] Taeden Geller, Mayer Masker, MD       Patient presents with left flank pain.  Pain is consistent with prior kidney stones.  He is nontoxic-appearing.  Denies any fevers or concerns for UTI.  He was given fluids and pain medication.  Urinalysis with greater than 50 red cells.  Given history, would like to avoid CT scan.  KUB obtained.  There are no radiopaque stones; however, I presume that  he likely has a stone.  See clinical course above.  Will treat supportively.  Final Clinical Impressions(s) / ED Diagnoses   Final diagnoses:  Ureteral colic    ED Discharge Orders         Ordered    oxyCODONE-acetaminophen (PERCOCET/ROXICET) 5-325 MG tablet  Every 6 hours PRN     10/11/18 0639    ondansetron (ZOFRAN ODT) 4 MG disintegrating tablet  Every 8 hours PRN     10/11/18 0639    tamsulosin (FLOMAX) 0.4 MG CAPS capsule  Daily     10/11/18 0639           Shon Baton, MD 10/11/18 [phone]

## 2018-10-11 NOTE — ED Notes (Signed)
Pt able to tolerate water without any nausea or vomiting.

## 2018-10-11 NOTE — ED Triage Notes (Addendum)
Patient coming from home with complaints of left sided flank pain that woke him up out of his sleep. Patient states that he feels as if he is being stabbed. Patient endorses nausea, but denies vomiting and diarrhea. Patient has a history of kidney stones and states that this pain feels similar to that.

## 2018-10-18 ENCOUNTER — Emergency Department (HOSPITAL_COMMUNITY)
Admission: EM | Admit: 2018-10-18 | Discharge: 2018-10-18 | Disposition: A | Discharge status: Home / Self Care | Payer: Self-pay | Attending: Emergency Medicine | Admitting: Emergency Medicine

## 2018-10-18 ENCOUNTER — Other Ambulatory Visit: Payer: Self-pay

## 2018-10-18 ENCOUNTER — Encounter (HOSPITAL_COMMUNITY): Payer: Self-pay | Admitting: Emergency Medicine

## 2018-10-18 ENCOUNTER — Emergency Department (HOSPITAL_COMMUNITY): Payer: Self-pay

## 2018-10-18 DIAGNOSIS — Z87891 Personal history of nicotine dependence: Secondary | ICD-10-CM | POA: Insufficient documentation

## 2018-10-18 DIAGNOSIS — N200 Calculus of kidney: Secondary | ICD-10-CM | POA: Diagnosis not present

## 2018-10-18 DIAGNOSIS — R1032 Left lower quadrant pain: Secondary | ICD-10-CM | POA: Insufficient documentation

## 2018-10-18 DIAGNOSIS — R109 Unspecified abdominal pain: Secondary | ICD-10-CM | POA: Diagnosis not present

## 2018-10-18 LAB — URINALYSIS, ROUTINE W REFLEX MICROSCOPIC
Bacteria, UA: NONE SEEN
Bilirubin Urine: NEGATIVE
Glucose, UA: NEGATIVE mg/dL
Ketones, ur: 5 mg/dL — AB
Leukocytes,Ua: NEGATIVE
Nitrite: NEGATIVE
Protein, ur: 30 mg/dL — AB
RBC / HPF: >50 RBC/hpf — ABNORMAL HIGH (ref 0–5)
Specific Gravity, Urine: 1.03 (ref 1.005–1.030)
pH: 5 (ref 5.0–8.0)

## 2018-10-18 LAB — CBC
HCT: 44.2 % (ref 39.0–52.0)
Hemoglobin: 14.5 g/dL (ref 13.0–17.0)
MCH: 28.4 pg (ref 26.0–34.0)
MCHC: 32.8 g/dL (ref 30.0–36.0)
MCV: 86.5 fL (ref 80.0–100.0)
Platelets: 316 10*3/uL (ref 150–400)
RBC: 5.11 MIL/uL (ref 4.22–5.81)
RDW: 13.3 % (ref 11.5–15.5)
WBC: 12.1 10*3/uL — ABNORMAL HIGH (ref 4.0–10.5)
nRBC: 0 % (ref 0.0–0.2)

## 2018-10-18 LAB — COMPREHENSIVE METABOLIC PANEL
ALT: 23 U/L (ref 0–44)
AST: 15 U/L (ref 15–41)
Albumin: 3.8 g/dL (ref 3.5–5.0)
Alkaline Phosphatase: 81 U/L (ref 38–126)
Anion gap: 10 (ref 5–15)
BUN: 15 mg/dL (ref 6–20)
CO2: 21 mmol/L — ABNORMAL LOW (ref 22–32)
Calcium: 9.1 mg/dL (ref 8.9–10.3)
Chloride: 105 mmol/L (ref 98–111)
Creatinine, Ser: 0.99 mg/dL (ref 0.61–1.24)
GFR calc Af Amer: >60 mL/min (ref >60)
GFR calc non Af Amer: >60 mL/min (ref >60)
Glucose, Bld: 153 mg/dL — ABNORMAL HIGH (ref 70–99)
Potassium: 3.7 mmol/L (ref 3.5–5.1)
Sodium: 136 mmol/L (ref 135–145)
Total Bilirubin: 0.8 mg/dL (ref 0.3–1.2)
Total Protein: 8.1 g/dL (ref 6.5–8.1)

## 2018-10-18 LAB — LIPASE, BLOOD: Lipase: 21 U/L (ref 11–51)

## 2018-10-18 MED ORDER — ONDANSETRON HCL 4 MG/2ML IJ SOLN
4.0000 mg | Freq: Once | INTRAMUSCULAR | Status: AC
  Administered 2018-10-18: 4 mg via INTRAVENOUS
  Filled 2018-10-18: qty 2

## 2018-10-18 MED ORDER — SODIUM CHLORIDE 0.9% FLUSH
3.0000 mL | Freq: Once | INTRAVENOUS | Status: AC
  Administered 2018-10-18: 3 mL via INTRAVENOUS

## 2018-10-18 MED ORDER — HYDROMORPHONE HCL 1 MG/ML IJ SOLN
1.0000 mg | Freq: Once | INTRAMUSCULAR | Status: AC
  Administered 2018-10-18: 1 mg via INTRAVENOUS
  Filled 2018-10-18: qty 1

## 2018-10-18 MED ORDER — MORPHINE SULFATE (PF) 4 MG/ML IV SOLN
4.0000 mg | Freq: Once | INTRAVENOUS | Status: AC
  Administered 2018-10-18: 4 mg via INTRAVENOUS
  Filled 2018-10-18: qty 1

## 2018-10-18 MED ORDER — KETOROLAC TROMETHAMINE 30 MG/ML IJ SOLN
30.0000 mg | Freq: Once | INTRAMUSCULAR | Status: AC
  Administered 2018-10-18: 30 mg via INTRAVENOUS
  Filled 2018-10-18: qty 1

## 2018-10-18 MED ORDER — ONDANSETRON HCL 4 MG/2ML IJ SOLN: 4.0000 mg | Freq: Once | INTRAMUSCULAR | Status: DC | PRN

## 2018-10-18 MED ORDER — SODIUM CHLORIDE 0.9 % IV BOLUS (SEPSIS)
1000.0000 mL | Freq: Once | INTRAVENOUS | Status: AC
  Administered 2018-10-18: 1000 mL via INTRAVENOUS

## 2018-10-18 NOTE — ED Triage Notes (Signed)
Pt reports having 2 episodes of vomiting prior to arrival.

## 2018-10-18 NOTE — ED Triage Notes (Signed)
Pt reports having left sided flank pain that began getting worse tonight. Pt recently dx with kidney stones. Pt actively vomiting at time of triage.

## 2018-10-18 NOTE — ED Provider Notes (Signed)
[facility] Provider Note   CSN: [id number] Arrival date & time: 10/18/18  [id number]    History   Chief Complaint Chief Complaint  Patient presents with   Flank Pain    HPI Jacob Morasdrian L Tocco Jr. is a 28 y.o. male.     The history is provided by the patient.  Flank Pain  This is a new problem. The current episode started more than 2 days ago. The problem occurs daily. The problem has been gradually worsening. Associated symptoms include abdominal pain. Pertinent negatives include no chest pain. Nothing aggravates the symptoms. Nothing relieves the symptoms.   Patient with known history of kidney stones presents with ongoing left flank pain for the past several days.  The pain radiates into his lower abdomen.  He is also having nausea and vomiting.  No fever.  No chest pain.  He reports previous history of lithotripsy Past Medical History:  Diagnosis Date   Bronchitis    Cellulitis of right hand - RECURRENT    Kidney stones     Patient Active Problem List   Diagnosis Date Noted   Hyponatremia 10/26/2016   Hypokalemia 10/26/2016   Elevated glucose level    Cellulitis of hand 08/04/2016   Carpal tunnel syndrome on left 05/08/2016   Abscess of upper arm    Cellulitis 01/22/2016   Leukocytosis 01/22/2016   Neuropathy 01/22/2016   Nephrolithiasis 01/22/2016   Cellulitis of right hand    Cellulitis of forearm, right 07/30/2015    Past Surgical History:  Procedure Laterality Date   CARPAL TUNNEL RELEASE Left 04/09/2016   Procedure: CARPAL TUNNEL RELEASE;  Surgeon: Cammy CopaScott Gregory [name], MD;  Location: [facility] OR;  Service: Orthopedics;  Laterality: Left;   HAND SURGERY     KIDNEY STONE SURGERY     lithortripsy        Home Medications    Prior to Admission medications   Medication Sig Start Date End Date Taking? Authorizing Provider  doxycycline (VIBRAMYCIN) 100 MG capsule Take 1 capsule (100 mg total) by mouth 2 (two) times  daily. One po bid x 7 days Patient not taking: Reported on 10/11/2018 09/20/18   Fayrene Helperran, Bowie, PA-C  ibuprofen (ADVIL) 600 MG tablet Take 1 tablet (600 mg total) by mouth every 6 (six) hours as needed. 09/20/18   Fayrene Helperran, Bowie, PA-C  ondansetron (ZOFRAN ODT) 4 MG disintegrating tablet Take 1 tablet (4 mg total) by mouth every 8 (eight) hours as needed for nausea or vomiting. 10/11/18   Horton, Mayer Maskerourtney F, MD  oxyCODONE-acetaminophen (PERCOCET/ROXICET) 5-325 MG tablet Take 1 tablet by mouth every 6 (six) hours as needed for severe pain. 10/11/18   Horton, Mayer Maskerourtney F, MD  tamsulosin (FLOMAX) 0.4 MG CAPS capsule Take 1 capsule (0.4 mg total) by mouth daily. 10/11/18   Horton, Mayer Maskerourtney F, MD    Family History Family History  Problem Relation Age of Onset   Hypertension Other    Diabetes Father     Social History Social History   Tobacco Use   Smoking status: Former Smoker   Smokeless tobacco: Never Used  Substance Use Topics   Alcohol use: Yes    Comment: occasionally    Drug use: No     Allergies   Coconut flavor; Fentanyl; and Penicillins   Review of Systems Review of Systems  Constitutional: Negative for fever.  Cardiovascular: Negative for chest pain.  Gastrointestinal: Positive for abdominal pain.  Genitourinary: Positive for difficulty urinating and flank pain. Negative for  testicular pain.  All other systems reviewed and are negative.    Physical Exam Updated Vital Signs BP 117/85 (BP Location: Left Arm)    Pulse 87    Temp 97.8 F (36.6 C) (Oral)    Resp 18    Ht 1.803 m ( )    Wt 117.9 kg    SpO2 96%    BMI 36.26 kg/m   Physical Exam CONSTITUTIONAL: Well developed/well nourished, uncomfortable appearing HEAD: Normocephalic/atraumatic EYES: EOMI/PERRL ENMT: Mucous membranes moist NECK: supple no meningeal signs SPINE/BACK:entire spine nontender CV: S1/S2 noted, no murmurs/rubs/gallops noted LUNGS: Lungs are clear to auscultation bilaterally, no apparent  distress ABDOMEN: soft, nontender, no rebound or guarding, bowel sounds noted throughout abdomen [id number] cva tenderness, no inguinal hernia, no scrotal tenderness NEURO: Pt is awake/alert/appropriate, moves all extremitiesx4.  No facial droop.   EXTREMITIES: pulses normal/equal, full ROM SKIN: warm, color normal PSYCH: anxious  ED Treatments / Results  Labs (all labs ordered are listed, but only abnormal results are displayed) Labs Reviewed  COMPREHENSIVE METABOLIC PANEL - Abnormal; Notable for the following components:      Result Value   CO2 21 (*)    Glucose, Bld 153 (*)    All other components within normal limits  CBC - Abnormal; Notable for the following components:   WBC 12.1 (*)    All other components within normal limits  URINALYSIS, ROUTINE W REFLEX MICROSCOPIC - Abnormal; Notable for the following components:   APPearance HAZY (*)    Hgb urine dipstick LARGE (*)    Ketones, ur 5 (*)    Protein, ur 30 (*)    RBC / HPF >50 (*)    All other components within normal limits  LIPASE, BLOOD    EKG None  Radiology US Renal  Result Date: 10/18/2018 CLINICAL DATA:  Initial evaluation for acute left flank pain, stone disease suspected. EXAM: RENAL / URINARY TRACT ULTRASOUND COMPLETE COMPARISON:  Prior CT from 03/29/2018 and radiograph from 10/11/2018. FINDINGS: Right Kidney: Renal measurements: 12.1 cm. Echogenicity within normal limits. No mass or hydronephrosis visualized. No shadowing echogenic foci to suggest nephrolithiasis. Left Kidney: Renal measurements: 12.0 cm. Echogenicity within normal limits. No mass or hydronephrosis visualized. No shadowing echogenic foci to suggest nephrolithiasis. Bladder: Appears normal for degree of bladder distention. IMPRESSION: Normal renal ultrasound, with no sonographic evidence for nephrolithiasis or obstructive uropathy. Electronically Signed   By: Rise Mu M.D.   On: 10/18/2018 05:39    Procedures Procedures     Medications Ordered in ED Medications  ondansetron (ZOFRAN) injection 4 mg (has no administration in time range)  HYDROmorphone (DILAUDID) injection 1 mg (has no administration in time range)  sodium chloride flush (NS) 0.9 % injection 3 mL (3 mLs Intravenous Given 10/18/18 0446)  ondansetron (ZOFRAN) injection 4 mg (4 mg Intravenous Given 10/18/18 0447)  ketorolac (TORADOL) 30 MG/ML injection 30 mg (30 mg Intravenous Given 10/18/18 0447)  morphine 4 MG/ML injection 4 mg (4 mg Intravenous Given 10/18/18 0447)  HYDROmorphone (DILAUDID) injection 1 mg (1 mg Intravenous Given 10/18/18 0554)  sodium chloride 0.9 % bolus 1,000 mL (1,000 mLs Intravenous New Bag/Given 10/18/18 0553)     Initial Impression / Assessment and Plan / ED Course  I have reviewed the triage vital signs and the nursing notes.  Pertinent labs & imaging results that were available during my care of the patient were reviewed by me and considered in my medical decision making (see chart for details).  5:05 AM Patient with previous history of kidney stones presents with recurrent left flank pain.  He appears uncomfortable.  He is afebrile. Note, patient has multiple CT scans in the past.  Will perform renal ultrasound to evaluate for any hydronephrosis to avoid CT imaging 7:18 AM Patient appears improved. Ultrasounds does not reveal any hydronephrosis.  Urinalysis reveals hematuria, no signs of infection. Suspicion for acute abdominal emergency is low. I don't feel emergent CT imaging required Advised to f/u with urology He already has pain meds and flomax We discussed strict ER return precautions  Final Clinical Impressions(s) / ED Diagnoses   Final diagnoses:  Flank pain    ED Discharge Orders    None       Zadie Rhine, MD 10/18/18 [phone]

## 2018-10-18 NOTE — ED Notes (Signed)
Pt was asking to talk to the night charge nurse due to feeling mistreated by the night RN. Made Patty RN aware to come speak with patient.

## 2018-10-18 NOTE — ED Notes (Signed)
Spoke with pt regarding his concerns, allowing him to vent prior to discharge.

## 2018-10-19 ENCOUNTER — Encounter (HOSPITAL_COMMUNITY): Payer: Self-pay | Admitting: Family Medicine

## 2018-10-19 ENCOUNTER — Emergency Department (HOSPITAL_COMMUNITY): Payer: Self-pay

## 2018-10-19 ENCOUNTER — Emergency Department (HOSPITAL_COMMUNITY)
Admission: EM | Admit: 2018-10-19 | Discharge: 2018-10-19 | Disposition: A | Discharge status: Home / Self Care | Payer: Self-pay | Attending: Emergency Medicine | Admitting: Emergency Medicine

## 2018-10-19 ENCOUNTER — Other Ambulatory Visit: Payer: Self-pay

## 2018-10-19 DIAGNOSIS — N200 Calculus of kidney: Secondary | ICD-10-CM | POA: Diagnosis not present

## 2018-10-19 DIAGNOSIS — Z79899 Other long term (current) drug therapy: Secondary | ICD-10-CM | POA: Insufficient documentation

## 2018-10-19 DIAGNOSIS — R109 Unspecified abdominal pain: Secondary | ICD-10-CM | POA: Diagnosis not present

## 2018-10-19 DIAGNOSIS — Z87891 Personal history of nicotine dependence: Secondary | ICD-10-CM | POA: Insufficient documentation

## 2018-10-19 LAB — CBC
HCT: 41.4 % (ref 39.0–52.0)
Hemoglobin: 13.8 g/dL (ref 13.0–17.0)
MCH: 29 pg (ref 26.0–34.0)
MCHC: 33.3 g/dL (ref 30.0–36.0)
MCV: 87 fL (ref 80.0–100.0)
Platelets: 315 10*3/uL (ref 150–400)
RBC: 4.76 MIL/uL (ref 4.22–5.81)
RDW: 13.6 % (ref 11.5–15.5)
WBC: 12.4 10*3/uL — ABNORMAL HIGH (ref 4.0–10.5)
nRBC: 0 % (ref 0.0–0.2)

## 2018-10-19 LAB — URINALYSIS, ROUTINE W REFLEX MICROSCOPIC
Bilirubin Urine: NEGATIVE
Glucose, UA: NEGATIVE mg/dL
Ketones, ur: NEGATIVE mg/dL
Nitrite: NEGATIVE
Protein, ur: 30 mg/dL — AB
RBC / HPF: >50 RBC/hpf — ABNORMAL HIGH (ref 0–5)
Specific Gravity, Urine: 1.015 (ref 1.005–1.030)
pH: 6 (ref 5.0–8.0)

## 2018-10-19 LAB — BASIC METABOLIC PANEL
Anion gap: 8 (ref 5–15)
BUN: 14 mg/dL (ref 6–20)
CO2: 22 mmol/L (ref 22–32)
Calcium: 8.7 mg/dL — ABNORMAL LOW (ref 8.9–10.3)
Chloride: 110 mmol/L (ref 98–111)
Creatinine, Ser: 0.92 mg/dL (ref 0.61–1.24)
GFR calc Af Amer: >60 mL/min (ref >60)
GFR calc non Af Amer: >60 mL/min (ref >60)
Glucose, Bld: 139 mg/dL — ABNORMAL HIGH (ref 70–99)
Potassium: 3.4 mmol/L — ABNORMAL LOW (ref 3.5–5.1)
Sodium: 140 mmol/L (ref 135–145)

## 2018-10-19 MED ORDER — HYDROMORPHONE HCL 1 MG/ML IJ SOLN
1.0000 mg | Freq: Once | INTRAMUSCULAR | Status: AC
  Administered 2018-10-19: 1 mg via INTRAVENOUS
  Filled 2018-10-19: qty 1

## 2018-10-19 MED ORDER — HYDROCODONE-ACETAMINOPHEN 5-325 MG PO TABS: 1.0000 | ORAL_TABLET | Freq: Four times a day (QID) | ORAL | 0 refills | Status: DC | PRN

## 2018-10-19 MED ORDER — SODIUM CHLORIDE 0.9 % IV BOLUS
1000.0000 mL | Freq: Once | INTRAVENOUS | Status: AC
  Administered 2018-10-19: 1000 mL via INTRAVENOUS

## 2018-10-19 MED ORDER — ONDANSETRON HCL 4 MG/2ML IJ SOLN
4.0000 mg | Freq: Once | INTRAMUSCULAR | Status: AC
  Administered 2018-10-19: 4 mg via INTRAVENOUS
  Filled 2018-10-19: qty 2

## 2018-10-19 NOTE — ED Notes (Signed)
Pt placed on monitor and into gown.

## 2018-10-19 NOTE — ED Provider Notes (Signed)
[facility]-EMERGENCY DEPT Provider Note   CSN: [id number] Arrival date & time: 10/19/18  1955    History   Chief Complaint Chief Complaint  Patient presents with  . Flank Pain    HPI Tulio Syracuse. is a 28 y.o. male.     The history is provided by the patient.  Flank Pain  This is a recurrent problem. The current episode started yesterday. The problem occurs daily. Progression since onset: waxing and waning. Associated symptoms include abdominal pain (left flank). Pertinent negatives include no chest pain, no headaches and no shortness of breath. Nothing aggravates the symptoms. The symptoms are relieved by narcotics. He has tried nothing for the symptoms. The treatment provided no relief.    Past Medical History:  Diagnosis Date  . Bronchitis   . Cellulitis of right hand - RECURRENT   . Kidney stones     Patient Active Problem List   Diagnosis Date Noted  . Hyponatremia 10/26/2016  . Hypokalemia 10/26/2016  . Elevated glucose level   . Cellulitis of hand 08/04/2016  . Carpal tunnel syndrome on left 05/08/2016  . Abscess of upper arm   . Cellulitis 01/22/2016  . Leukocytosis 01/22/2016  . Neuropathy 01/22/2016  . Nephrolithiasis 01/22/2016  . Cellulitis of right hand   . Cellulitis of forearm, right 07/30/2015    Past Surgical History:  Procedure Laterality Date  . CARPAL TUNNEL RELEASE Left 04/09/2016   Procedure: CARPAL TUNNEL RELEASE;  Surgeon: Cammy Copa, MD;  Location: [facility] OR;  Service: Orthopedics;  Laterality: Left;  . HAND SURGERY    . KIDNEY STONE SURGERY     lithortripsy        Home Medications    Prior to Admission medications   Medication Sig Start Date End Date Taking? Authorizing Provider  tamsulosin (FLOMAX) 0.4 MG CAPS capsule Take 1 capsule (0.4 mg total) by mouth daily. 10/11/18  Yes Horton, Mayer Masker, MD  doxycycline (VIBRAMYCIN) 100 MG capsule Take 1 capsule (100 mg total) by mouth 2 (two) times daily.  One po bid x 7 days Patient not taking: Reported on 10/11/2018 09/20/18   Fayrene Helper, PA-C  HYDROcodone-acetaminophen (NORCO/VICODIN) 5-325 MG tablet Take 1 tablet by mouth every 6 (six) hours as needed for up to 15 doses. 10/19/18   Pebble Botkin, DO  ibuprofen (ADVIL) 600 MG tablet Take 1 tablet (600 mg total) by mouth every 6 (six) hours as needed. Patient not taking: Reported on 10/19/2018 09/20/18   Fayrene Helper, PA-C  ondansetron (ZOFRAN ODT) 4 MG disintegrating tablet Take 1 tablet (4 mg total) by mouth every 8 (eight) hours as needed for nausea or vomiting. Patient not taking: Reported on 10/19/2018 10/11/18   Horton, Mayer Masker, MD  oxyCODONE-acetaminophen (PERCOCET/ROXICET) 5-325 MG tablet Take 1 tablet by mouth every 6 (six) hours as needed for severe pain. Patient not taking: Reported on 10/19/2018 10/11/18   Horton, Mayer Masker, MD    Family History Family History  Problem Relation Age of Onset  . Hypertension Other   . Diabetes Father     Social History Social History   Tobacco Use  . Smoking status: Former Games developer  . Smokeless tobacco: Never Used  Substance Use Topics  . Alcohol use: Yes    Comment: 2-3 times a month   . Drug use: No     Allergies   Coconut flavor; Fentanyl; and Penicillins   Review of Systems Review of Systems  Constitutional: Negative for chills and fever.  HENT: Negative for ear pain and sore throat.   Eyes: Negative for pain and visual disturbance.  Respiratory: Negative for cough and shortness of breath.   Cardiovascular: Negative for chest pain and palpitations.  Gastrointestinal: Positive for abdominal pain (left flank). Negative for vomiting.  Genitourinary: Positive for flank pain. Negative for dysuria and hematuria.  Musculoskeletal: Negative for arthralgias and back pain.  Skin: Negative for color change and rash.  Neurological: Negative for seizures, syncope and headaches.  All other systems reviewed and are negative.    Physical Exam  Updated Vital Signs  ED Triage Vitals  Enc Vitals Group     BP 10/19/18 2123 139/83     Pulse Rate 10/19/18 2123 85     Resp 10/19/18 2123 16     Temp 10/19/18 2123 98.9 F (37.2 C)     Temp Source 10/19/18 2123 Oral     SpO2 10/19/18 2123 100 %     Weight 10/19/18 2123 260 lb (117.9 kg)     Height 10/19/18 2123  (1.803 m)     Head Circumference --      Peak Flow --      Pain Score 10/19/18 2132 9     Pain Loc --      Pain Edu? --      Excl. in GC? --     Physical Exam Vitals signs and nursing note reviewed.  Constitutional:      General: He is in acute distress.     Appearance: He is well-developed. He is not ill-appearing.  HENT:     Head: Normocephalic and atraumatic.     Nose: Nose normal.     Mouth/Throat:     Mouth: Mucous membranes are moist.  Eyes:     Extraocular Movements: Extraocular movements intact.     Conjunctiva/sclera: Conjunctivae normal.     Pupils: Pupils are equal, round, and reactive to light.  Neck:     Musculoskeletal: Normal range of motion and neck supple.  Cardiovascular:     Rate and Rhythm: Normal rate and regular rhythm.     Pulses: Normal pulses.     Heart sounds: Normal heart sounds. No murmur.  Pulmonary:     Effort: Pulmonary effort is normal. No respiratory distress.     Breath sounds: Normal breath sounds.  Abdominal:     General: There is no distension.     Palpations: Abdomen is soft.     Tenderness: There is no abdominal tenderness. There is left CVA tenderness. There is no right CVA tenderness.  Skin:    General: Skin is warm and dry.     Capillary Refill: Capillary refill takes less than 2 seconds.  Neurological:     General: No focal deficit present.     Mental Status: He is alert.  Psychiatric:        Mood and Affect: Mood normal.      ED Treatments / Results  Labs (all labs ordered are listed, but only abnormal results are displayed) Labs Reviewed  URINALYSIS, ROUTINE W REFLEX MICROSCOPIC - Abnormal;  Notable for the following components:      Result Value   APPearance HAZY (*)    Hgb urine dipstick LARGE (*)    Protein, ur 30 (*)    Leukocytes,Ua TRACE (*)    RBC / HPF >50 (*)    Bacteria, UA RARE (*)    All other components within normal limits  BASIC METABOLIC PANEL - Abnormal; Notable for  the following components:   Potassium 3.4 (*)    Glucose, Bld 139 (*)    Calcium 8.7 (*)    All other components within normal limits  CBC - Abnormal; Notable for the following components:   WBC 12.4 (*)    All other components within normal limits    EKG None  Radiology US Renal  Result Date: 10/18/2018 CLINICAL DATA:  Initial evaluation for acute left flank pain, stone disease suspected. EXAM: RENAL / URINARY TRACT ULTRASOUND COMPLETE COMPARISON:  Prior CT from 03/29/2018 and radiograph from 10/11/2018. FINDINGS: Right Kidney: Renal measurements: 12.1 cm. Echogenicity within normal limits. No mass or hydronephrosis visualized. No shadowing echogenic foci to suggest nephrolithiasis. Left Kidney: Renal measurements: 12.0 cm. Echogenicity within normal limits. No mass or hydronephrosis visualized. No shadowing echogenic foci to suggest nephrolithiasis. Bladder: Appears normal for degree of bladder distention. IMPRESSION: Normal renal ultrasound, with no sonographic evidence for nephrolithiasis or obstructive uropathy. Electronically Signed   By: Rise Mu M.D.   On: 10/18/2018 05:39   Ct Renal Stone Study  Result Date: 10/19/2018 CLINICAL DATA:  Left-sided flank pain that radiates to left groin. EXAM: CT ABDOMEN AND PELVIS WITHOUT CONTRAST TECHNIQUE: Multidetector CT imaging of the abdomen and pelvis was performed following the standard protocol without IV contrast. COMPARISON:  CT dated 03/29/2018. FINDINGS: Lower chest: No acute abnormality. Hepatobiliary: There is hepatic steatosis. The gallbladder is unremarkable. Pancreas: Unremarkable. No pancreatic ductal dilatation or surrounding  inflammatory changes. Spleen: Normal in size without focal abnormality. Adrenals/Urinary Tract: There is a nonobstructing 3-4 mm stone in the upper pole the left kidney. There is mild left-sided hydroureteronephrosis secondary to obstructing 4 mm stone in the mid to distal left ureter (axial series 2, image 72). No right-sided hydronephrosis. There is a punctate 1 mm stone in the lower pole the right kidney. The adrenal glands are unremarkable. The bladder is unremarkable. Stomach/Bowel: Stomach is within normal limits. Appendix appears normal. No evidence of bowel wall thickening, distention, or inflammatory changes. Vascular/Lymphatic: No significant vascular findings are present. No enlarged abdominal or pelvic lymph nodes. Reproductive: Uterus and bilateral adnexa are unremarkable. Other: No abdominal wall hernia or abnormality. No abdominopelvic ascites. Musculoskeletal: No acute or significant osseous findings. IMPRESSION: 1. Mild left-sided hydroureteronephrosis secondary to an obstructing 4 mm stone in the mid to distal left ureter. 2. Bilateral nephrolithiasis as detailed above. 3. Hepatic steatosis. Electronically Signed   By: Katherine Mantle M.D.   On: 10/19/2018 23:17    Procedures Procedures (including critical care time)  Medications Ordered in ED Medications  HYDROmorphone (DILAUDID) injection 1 mg (has no administration in time range)  sodium chloride 0.9 % bolus 1,000 mL (1,000 mLs Intravenous New Bag/Given 10/19/18 2218)  HYDROmorphone (DILAUDID) injection 1 mg (1 mg Intravenous Given 10/19/18 2216)  ondansetron (ZOFRAN) injection 4 mg (4 mg Intravenous Given 10/19/18 2216)     Initial Impression / Assessment and Plan / ED Course  I have reviewed the triage vital signs and the nursing notes.  Pertinent labs & imaging results that were available during my care of the patient were reviewed by me and considered in my medical decision making (see chart for details).     Reedy Biernat. is a 28 year old male who presents to the ED with left flank pain.  Patient has history of kidney stones.  Normal vitals.  No fever.  Seen here yesterday for the same and had unremarkable ultrasound.  Ran out of narcotic pain medicine.  Has  had some nausea.  Patient denies any testicular pain.  Has some left CVA tenderness on exam.  Will get basic labs, urinalysis, CT scan to evaluate for kidney stones.  Patient given IV Dilaudid, Zofran, normal saline bolus. Will re-evaluate after images and lab.  Patient with overall unremarkable labs.  Mild leukocytosis.  Urinalysis overall unremarkable.  CT scan shows 4 mm stone in the ureter. No concern for septic stone. Patient felt improved following additional dose of IV Dilaudid.  Patient was called and new prescription for Norco.  Patient already has Flomax and Zofran at home.  Discharged from the ED in good condition.  This chart was dictated using voice recognition software.  Despite best efforts to proofread,  errors can occur which can change the documentation meaning.   Final Clinical Impressions(s) / ED Diagnoses   Final diagnoses:  Kidney stone    ED Discharge Orders         Ordered    HYDROcodone-acetaminophen (NORCO/VICODIN) 5-325 MG tablet  Every 6 hours PRN     10/19/18 2326           Virgina NorfolkCuratolo, Muntaha Vermette, DO 10/19/18 2329

## 2018-10-19 NOTE — ED Notes (Signed)
Bed: WA13 Expected date:  Expected time:  Means of arrival:  Comments: Triage 3 

## 2018-10-19 NOTE — ED Triage Notes (Signed)
Patient is complaining of left flank radiates around to his left groin. Patient has a history of kidney stones and this feels like it, except worse. Patient is able to urinate but reports his urine has been darker despite him drinking only water. Denies fever.

## 2018-12-15 ENCOUNTER — Encounter: Payer: Self-pay | Admitting: Family Medicine

## 2018-12-15 ENCOUNTER — Ambulatory Visit: Payer: Self-pay

## 2018-12-15 ENCOUNTER — Other Ambulatory Visit: Payer: Self-pay

## 2018-12-15 ENCOUNTER — Ambulatory Visit (INDEPENDENT_AMBULATORY_CARE_PROVIDER_SITE_OTHER): Payer: BC Managed Care – PPO | Admitting: Family Medicine

## 2018-12-15 VITALS — BP 122/88 | HR 81 | Ht 71.0 in | Wt 260.0 lb

## 2018-12-15 DIAGNOSIS — M25512 Pain in left shoulder: Secondary | ICD-10-CM | POA: Diagnosis not present

## 2018-12-15 DIAGNOSIS — G8929 Other chronic pain: Secondary | ICD-10-CM

## 2018-12-15 DIAGNOSIS — S43432A Superior glenoid labrum lesion of left shoulder, initial encounter: Secondary | ICD-10-CM | POA: Diagnosis not present

## 2018-12-15 DIAGNOSIS — S43439A Superior glenoid labrum lesion of unspecified shoulder, initial encounter: Secondary | ICD-10-CM | POA: Insufficient documentation

## 2018-12-15 MED ORDER — MONTELUKAST SODIUM 10 MG PO TABS: 10.0000 mg | ORAL_TABLET | Freq: Every day | ORAL | 3 refills | Status: DC

## 2018-12-15 MED ORDER — NITROGLYCERIN 0.2 MG/HR TD PT24: MEDICATED_PATCH | TRANSDERMAL | 1 refills | Status: DC

## 2018-12-15 NOTE — Progress Notes (Signed)
[facility] [place], [place] [address] Phone: [phone] Subjective:   [name], am serving as a scribe for Dr. Hulan Saas.   CC: Left shoulder pain  [id number]  Jacob [facility]. is a 28 y.o. male coming in with complaint of left shoulder pain. Had workman's comp injury in October. Did 4 weeks of PT for injury. Was told that he should have surgery and wants another opinion. Pain is constant. Was told that he has a labral tear on an MRI performed in November. Has tried 2 cortisone injections which did not help relieve his pain. 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  Patient's MRI was independently visualized by me.  MRI showed the patient did have a posterior fraying of the labrum and mild subacromial bursitis but otherwise fairly unremarkable.  Patient is wondering what other possibilities she can do to keep from having any type of surgery.      Past Medical History:  Diagnosis Date  . Bronchitis   . Cellulitis of right hand - RECURRENT   . Kidney stones    Past Surgical History:  Procedure Laterality Date  . CARPAL TUNNEL RELEASE Left 04/09/2016   Procedure: CARPAL TUNNEL RELEASE;  Surgeon: Meredith Pel, MD;  Location: [facility];  Service: Orthopedics;  Laterality: Left;  . HAND SURGERY    . KIDNEY STONE SURGERY     lithortripsy   Social History   Socioeconomic History  . Marital status: Married    Spouse name: Not on file  . Number of children: Not on file  . Years of education: Not on file  . Highest education level: Not on file  Occupational History  . Not on file  Social Needs  . Financial resource strain: Not on file  . Food insecurity    Worry: Not on file    Inability: Not on file  . Transportation needs    Medical: Not on file    Non-medical: Not on file  Tobacco Use  . Smoking status: Former Research scientist (life sciences)  . Smokeless tobacco: Never Used  Substance and Sexual Activity  . Alcohol use: Yes   Comment: 2-3 times a month   . Drug use: No  . Sexual activity: Not on file  Lifestyle  . Physical activity    Days per week: Not on file    Minutes per session: Not on file  . Stress: Not on file  Relationships  . Social Herbalist on phone: Not on file    Gets together: Not on file    Attends religious service: Not on file    Active member of club or organization: Not on file    Attends meetings of clubs or organizations: Not on file    Relationship status: Not on file  Other Topics Concern  . Not on file  Social History Narrative  . Not on file   Allergies  Allergen Reactions  . Coconut Flavor     Scratchy throat  . Fentanyl     Pt reports feeling sick with fentanyl  . Penicillins     DID THE REACTION INVOLVE: Swelling of the face/tongue/throat, SOB, or low BP? Y Sudden or severe rash/hives, skin peeling, or the inside of the mouth or nose? N  Did it require medical treatment? Y When did it last happen?January 2020 If all above answers are "NO", may proceed with cephalosporin use.    Family History  Problem Relation Age of Onset  . Hypertension Other   . Diabetes Father      Current Outpatient Medications (Cardiovascular):  .  nitroGLYCERIN (NITRODUR - DOSED IN MG/24 HR) 0.2 mg/hr patch, 1/4 patch daily  Current Outpatient Medications (Respiratory):  .  montelukast (SINGULAIR) 10 MG tablet, Take 1 tablet (10 mg total) by mouth at bedtime.  Current Outpatient Medications (Analgesics):  .  HYDROcodone-acetaminophen (NORCO/VICODIN) 5-325 MG tablet, Take 1 tablet by mouth every 6 (six) hours as needed for up to 15 doses. Marland Kitchen.  ibuprofen (ADVIL) 600 MG tablet, Take 1 tablet (600 mg total) by mouth every 6 (six) hours as needed. (Patient not taking: Reported on 10/19/2018) .  oxyCODONE-acetaminophen (PERCOCET/ROXICET) 5-325 MG tablet, Take 1 tablet by mouth every 6 (six) hours as needed for severe pain. (Patient not taking: Reported on 10/19/2018)   Current  Outpatient Medications (Other):  .  doxycycline (VIBRAMYCIN) 100 MG capsule, Take 1 capsule (100 mg total) by mouth 2 (two) times daily. One po bid x 7 days (Patient not taking: Reported on 10/11/2018) .  ondansetron (ZOFRAN ODT) 4 MG disintegrating tablet, Take 1 tablet (4 mg total) by mouth every 8 (eight) hours as needed for nausea or vomiting. (Patient not taking: Reported on 10/19/2018) .  tamsulosin (FLOMAX) 0.4 MG CAPS capsule, Take 1 capsule (0.4 mg total) by mouth daily.    Past medical history, social, surgical and family history all reviewed in electronic medical record.  No pertanent information unless stated regarding to the chief complaint.   Review of Systems:  No headache, visual changes, nausea, vomiting, diarrhea, constipation, dizziness, abdominal pain, skin rash, fevers, chills, night sweats, weight loss, swollen lymph nodes, body aches, joint swelling,  chest pain, shortness of breath, mood changes.  Positive muscle aches  Objective  Blood pressure 122/88, pulse 81, height 5\' 11"  (1.803 m), weight 260 lb (117.9 kg), SpO2 98 %. Systems examined below as of    General: No apparent distress alert and oriented x3 mood and affect normal, dressed appropriately.  HEENT: Pupils equal, extraocular movements intact  Respiratory: Patient's speak in full sentences and does not appear short of breath  Cardiovascular: No lower extremity edema, non tender, no erythema  Skin: Warm dry intact with no signs of infection or rash on extremities or on axial skeleton.  Abdomen: Soft nontender  Neuro: Cranial nerves II through XII are intact, neurovascularly intact in all extremities with 2+ DTRs and 2+ pulses.  Lymph: No lymphadenopathy of posterior or anterior cervical chain or axillae bilaterally.  Gait normal with good balance and coordination.  MSK:  Non tender with full range of motion and good stability and symmetric strength and tone of elbows, wrist, hip, knee and ankles bilaterally.   Trace swelling of the hands noted Shoulder: left Inspection reveals no abnormalities, atrophy or asymmetry. Palpation is normal with no tenderness over AC joint or bicipital groove. ROM is full in all planes passively. Rotator cuff strength normal throughout. signs of impingement with positive Neer and Hawkin's tests, but negative empty can sign. Speeds and Yergason's tests normal. Positive O'Brien's Normal scapular function observed. No painful arc and no drop arm sign. No apprehension sign Contralateral shoulder unremarkable   MSK US performed of: left This study was ordered, performed, and interpreted by Terrilee FilesZach Raeden Schippers D.O.  Shoulder:   Supraspinatus:  Appears normal on long and transverse views, Bursal bulge seen with shoulder abduction on impingement view. Subscapularis:  Appears normal on long and transverse views. Positive bursa  AC joint: Mild distention Glenohumeral Joint:  Appears normal without effusion. Glenoid Labrum: Posterior labral tear Biceps Tendon:  Appears normal on long and transverse views, no fraying of tendon, tendon located in intertubercular groove, no subluxation with shoulder internal or external rotation.  Impression: Subacromial bursitis, posterior tear noted of the labrum      Impression and Recommendations:     This case required medical decision making of moderate complexity. The above documentation has been reviewed and is accurate and complete Judi SaaZachary M Vernelle Wisner, DO       Note: This dictation was prepared with Dragon dictation along with smaller phrase technology. Any transcriptional errors that result from this process are unintentional.

## 2018-12-15 NOTE — Patient Instructions (Addendum)
Nitroglycerin Protocol   Apply 1/4 nitroglycerin patch to affected area daily.  Change position of patch within the affected area every 24 hours.  You may experience a headache during the first 1-2 weeks of using the patch, these should subside.  If you experience headaches after beginning nitroglycerin patch treatment, you may take your preferred over the counter pain reliever.  Another side effect of the nitroglycerin patch is skin irritation or rash related to patch adhesive.  Please notify our office if you develop more severe headaches or rash, and stop the patch.  Tendon healing with nitroglycerin patch may require 12 to 24 weeks depending on the extent of injury.  Men should not use if taking Viagra, Cialis, or Levitra.   Do not use if you have migraines or rosacea.   pennsaid pinkie amount topically 2 times daily as needed.  Ice 20 minutes 2 times daily. Usually after activity and before bed. singulair nightly to help if an allergy is laying a role. Keep hands within peripheral vision  I would have you wear gloves and a mask for now at work as well always.  See me again in 4 weeks

## 2018-12-15 NOTE — Assessment & Plan Note (Signed)
Patient is an MRI showing some of the labral pathology.  Patient symptoms do correspond somewhat with this.  We discussed with patient though at great length about different treatment options.  Patient does have a subacromial bursitis that is likely also contributing to some of his pain.  Patient is already had 2 steroid injections and done formal physical therapy with very minimal improvement.  We discussed with patient also having some pale nails laboratory work-up could be beneficial to see if there is a reason why things are not healing.  In addition to this I would consider the possibility of PRP injections if this does not seem to help.  Started on nitroglycerin and warned of side effects.  Home exercises given.  Follow-up again in 4 to 8 weeks

## 2019-01-13 ENCOUNTER — Ambulatory Visit (INDEPENDENT_AMBULATORY_CARE_PROVIDER_SITE_OTHER): Payer: BC Managed Care – PPO | Admitting: Family Medicine

## 2019-01-13 ENCOUNTER — Other Ambulatory Visit (INDEPENDENT_AMBULATORY_CARE_PROVIDER_SITE_OTHER): Payer: BC Managed Care – PPO

## 2019-01-13 ENCOUNTER — Encounter: Payer: Self-pay | Admitting: Family Medicine

## 2019-01-13 ENCOUNTER — Other Ambulatory Visit: Payer: Self-pay

## 2019-01-13 ENCOUNTER — Ambulatory Visit: Payer: Self-pay

## 2019-01-13 VITALS — BP 110/64 | HR 93 | Ht 71.0 in | Wt 262.0 lb

## 2019-01-13 DIAGNOSIS — G8929 Other chronic pain: Secondary | ICD-10-CM | POA: Diagnosis not present

## 2019-01-13 DIAGNOSIS — M2550 Pain in unspecified joint: Secondary | ICD-10-CM

## 2019-01-13 DIAGNOSIS — G5602 Carpal tunnel syndrome, left upper limb: Secondary | ICD-10-CM | POA: Diagnosis not present

## 2019-01-13 DIAGNOSIS — S43432D Superior glenoid labrum lesion of left shoulder, subsequent encounter: Secondary | ICD-10-CM

## 2019-01-13 DIAGNOSIS — M25512 Pain in left shoulder: Secondary | ICD-10-CM

## 2019-01-13 LAB — CBC WITH DIFFERENTIAL/PLATELET
Basophils Absolute: 0.1 10*3/uL (ref 0.0–0.1)
Basophils Relative: 0.9 % (ref 0.0–3.0)
Eosinophils Absolute: 0.1 10*3/uL (ref 0.0–0.7)
Eosinophils Relative: 1.2 % (ref 0.0–5.0)
HCT: 47.7 % (ref 39.0–52.0)
Hemoglobin: 16 g/dL (ref 13.0–17.0)
Lymphocytes Relative: 28 % (ref 12.0–46.0)
Lymphs Abs: 2.6 10*3/uL (ref 0.7–4.0)
MCHC: 33.5 g/dL (ref 30.0–36.0)
MCV: 85.9 fl (ref 78.0–100.0)
Monocytes Absolute: 0.8 10*3/uL (ref 0.1–1.0)
Monocytes Relative: 8.4 % (ref 3.0–12.0)
Neutro Abs: 5.8 10*3/uL (ref 1.4–7.7)
Neutrophils Relative %: 61.5 % (ref 43.0–77.0)
Platelets: 319 10*3/uL (ref 150.0–400.0)
RBC: 5.55 Mil/uL (ref 4.22–5.81)
RDW: 15 % (ref 11.5–15.5)
WBC: 9.5 10*3/uL (ref 4.0–10.5)

## 2019-01-13 LAB — COMPREHENSIVE METABOLIC PANEL
ALT: 18 U/L (ref 0–53)
AST: 14 U/L (ref 0–37)
Albumin: 4.1 g/dL (ref 3.5–5.2)
Alkaline Phosphatase: 82 U/L (ref 39–117)
BUN: 8 mg/dL (ref 6–23)
CO2: 24 mEq/L (ref 19–32)
Calcium: 9.2 mg/dL (ref 8.4–10.5)
Chloride: 105 mEq/L (ref 96–112)
Creatinine, Ser: 0.9 mg/dL (ref 0.40–1.50)
GFR: 120.56 mL/min (ref >60.00)
Glucose, Bld: 89 mg/dL (ref 70–99)
Potassium: 3.9 mEq/L (ref 3.5–5.1)
Sodium: 138 mEq/L (ref 135–145)
Total Bilirubin: 0.5 mg/dL (ref 0.2–1.2)
Total Protein: 7.6 g/dL (ref 6.0–8.3)

## 2019-01-13 LAB — TSH: TSH: 1.56 u[IU]/mL (ref 0.35–4.50)

## 2019-01-13 LAB — VITAMIN D 25 HYDROXY (VIT D DEFICIENCY, FRACTURES): VITD: 23.37 ng/mL — ABNORMAL LOW (ref 30.00–100.00)

## 2019-01-13 LAB — IBC PANEL
Iron: 91 ug/dL (ref 42–165)
Saturation Ratios: 28.3 % (ref 20.0–50.0)
Transferrin: 230 mg/dL (ref 212.0–360.0)

## 2019-01-13 LAB — SEDIMENTATION RATE: Sed Rate: 28 mm/hr — ABNORMAL HIGH (ref 0–15)

## 2019-01-13 LAB — URIC ACID: Uric Acid, Serum: 7.2 mg/dL (ref 4.0–7.8)

## 2019-01-13 LAB — FERRITIN: Ferritin: 63.3 ng/mL (ref 22.0–322.0)

## 2019-01-13 LAB — TESTOSTERONE: Testosterone: 303.41 ng/dL (ref 300.00–890.00)

## 2019-01-13 LAB — C-REACTIVE PROTEIN: CRP: 1.1 mg/dL (ref 0.5–20.0)

## 2019-01-13 MED ORDER — MELOXICAM 15 MG PO TABS: 15.0000 mg | ORAL_TABLET | Freq: Every day | ORAL | 0 refills | Status: DC

## 2019-01-13 NOTE — Assessment & Plan Note (Signed)
Patient given injection today and tolerated the procedure well.  Am hoping that this will be beneficial.  If he does not make any significant improvement PRP is another suggestion.  Patient is failed all other conservative therapy including formal physical therapy and other injections.  Follow-up again in 4 weeks.

## 2019-01-13 NOTE — Patient Instructions (Signed)
Injected shoulder and wrist today See me again in 4 weeks Call us though if you would like to do PRP

## 2019-01-13 NOTE — Assessment & Plan Note (Signed)
Carpal tunnel was injected today.  This is right side.  In January 13, 2019. Bracing at night, home exercise, follow-up again in 4 to 8 weeks

## 2019-01-13 NOTE — Progress Notes (Signed)
[facility] D.[facility] [address] [place], [place] [address] Phone: [phone] Subjective:     CC: Right wrist pain and left shoulder pain follow-up  ZHY:[id number]:Subjective   12/15/2018 Patient is an MRI showing some of the labral pathology.  Patient symptoms do correspond somewhat with this.  We discussed with patient though at great length about different treatment options.  Patient does have a subacromial bursitis that is likely also contributing to some of his pain.  Patient is already had 2 steroid injections and done formal physical therapy with very minimal improvement.  We discussed with patient also having some pale nails laboratory work-up could be beneficial to see if there is a reason why things are not healing.  In addition to this I would consider the possibility of PRP injections if this does not seem to help.  Started on nitroglycerin and warned of side effects.  Home exercises given.  Follow-up again in 4 to 8 weeks  Update 01/13/2019 Jacob Morasdrian L Maziarz Jr. is a 28 y.o. male coming in with complaint of left shoulder pain. Patient states that he not any better since last visit. Had to stop using medications given to him including patches due to swelling that developed in his hand.  Patient states that the shoulder still gives him significant trouble especially at the end of a long day.  Patient denies though any the radiation down the arm is much.  Worsening pain though noted in the right hand.  More numbness that is now constant.  Affecting daily activities and holding certain things.    Past Medical History:  Diagnosis Date  . Bronchitis   . Cellulitis of right hand - RECURRENT   . Kidney stones    Past Surgical History:  Procedure Laterality Date  . CARPAL TUNNEL RELEASE Left 04/09/2016   Procedure: CARPAL TUNNEL RELEASE;  Surgeon: Cammy CopaScott Gregory [name], MD;  [facility];  Service: Orthopedics;  Laterality: Left;  . HAND SURGERY    . KIDNEY STONE SURGERY     lithortripsy   Social History   Socioeconomic History  . Marital status: Married    Spouse name: Not on file  . Number of children: Not on file  . Years of education: Not on file  . Highest education level: Not on file  Occupational History  . Not on file  Social Needs  . Financial resource strain: Not on file  . Food insecurity    Worry: Not on file    Inability: Not on file  . Transportation needs    Medical: Not on file    Non-medical: Not on file  Tobacco Use  . Smoking status: Former Games developermoker  . Smokeless tobacco: Never Used  Substance and Sexual Activity  . Alcohol use: Yes    Comment: 2-3 times a month   . Drug use: No  . Sexual activity: Not on file  Lifestyle  . Physical activity    Days per week: Not on file    Minutes per session: Not on file  . Stress: Not on file  Relationships  . Social Musicianconnections    Talks on phone: Not on file    Gets together: Not on file    Attends religious service: Not on file    Active member of club or organization: Not on file    Attends meetings of clubs or organizations: Not on file    Relationship status: Not on file  Other Topics Concern  . Not on  file  Social History Narrative  . Not on file   Allergies  Allergen Reactions  . Coconut Flavor     Scratchy throat  . Fentanyl     Pt reports feeling sick with fentanyl  . Penicillins     DID THE REACTION INVOLVE: Swelling of the face/tongue/throat, SOB, or low BP? Y Sudden or severe rash/hives, skin peeling, or the inside of the mouth or nose? N  Did it require medical treatment? Y When did it last happen?January 2020 If all above answers are "NO", may proceed with cephalosporin use.    Family History  Problem Relation Age of Onset  . Hypertension Other   . Diabetes Father      Current Outpatient Medications (Cardiovascular):  .  nitroGLYCERIN (NITRODUR - DOSED IN MG/24 HR) 0.2 mg/hr patch, 1/4 patch daily  Current Outpatient Medications (Respiratory):  .   montelukast (SINGULAIR) 10 MG tablet, Take 1 tablet (10 mg total) by mouth at bedtime.  Current Outpatient Medications (Analgesics):  .  HYDROcodone-acetaminophen (NORCO/VICODIN) 5-325 MG tablet, Take 1 tablet by mouth every 6 (six) hours as needed for up to 15 doses. Marland Kitchen  ibuprofen (ADVIL) 600 MG tablet, Take 1 tablet (600 mg total) by mouth every 6 (six) hours as needed. Marland Kitchen  oxyCODONE-acetaminophen (PERCOCET/ROXICET) 5-325 MG tablet, Take 1 tablet by mouth every 6 (six) hours as needed for severe pain. .  meloxicam (MOBIC) 15 MG tablet, Take 1 tablet (15 mg total) by mouth daily.   Current Outpatient Medications (Other):  .  doxycycline (VIBRAMYCIN) 100 MG capsule, Take 1 capsule (100 mg total) by mouth 2 (two) times daily. One po bid x 7 days .  ondansetron (ZOFRAN ODT) 4 MG disintegrating tablet, Take 1 tablet (4 mg total) by mouth every 8 (eight) hours as needed for nausea or vomiting. .  tamsulosin (FLOMAX) 0.4 MG CAPS capsule, Take 1 capsule (0.4 mg total) by mouth daily.    Past medical history, social, surgical and family history all reviewed in electronic medical record.  No pertanent information unless stated regarding to the chief complaint.   Review of Systems:  No headache, visual changes, nausea, vomiting, diarrhea, constipation, dizziness, abdominal pain, skin rash, fevers, chills, night sweats, weight loss, swollen lymph nodes, body aches, joint swelling,  chest pain, shortness of breath, mood changes.  Positive muscle aches  Objective  Blood pressure 110/64, pulse 93, height 5\' 11"  (1.803 m), weight 262 lb (118.8 kg), SpO2 98 %.   General: No apparent distress alert and oriented x3 mood and affect normal, dressed appropriately.  HEENT: Pupils equal, extraocular movements intact  Respiratory: Patient's speak in full sentences and does not appear short of breath  Cardiovascular: No lower extremity edema, non tender, no erythema  Skin: Warm dry intact with no signs of  infection or rash on extremities or on axial skeleton.  Abdomen: Soft nontender  Neuro: Cranial nerves II through XII are intact, neurovascularly intact in all extremities with 2+ DTRs and 2+ pulses.  Lymph: No lymphadenopathy of posterior or anterior cervical chain or axillae bilaterally.  Gait normal with good balance and coordination.  MSK:  Non tender with full range of motion and good stability and symmetric strength and tone of  elbows, , hip, knee and ankles bilaterally.  Right wrist exam shows the patient does have some mild swelling, positive Tinel sign.  Patient does though have near full range of motion lacking the last terminal findings of dorsiflexion.  Good grip strength still noted.  No thenar eminence wasting  Left shoulder does have a positive O'Brien's.  Rotator cuff strength 4+ out of 5.  Mild impingement noted with Hawkins.  Otherwise unremarkable  Procedure: Real-time Ultrasound Guided Injection of left glenohumeral joint Device: GE Logiq E  Ultrasound guided injection is preferred based studies that show increased duration, increased effect, greater accuracy, decreased procedural pain, increased response rate with ultrasound guided versus blind injection.  Verbal informed consent obtained.  Time-out conducted.  Noted no overlying erythema, induration, or other signs of local infection.  Skin prepped in a sterile fashion.  Local anesthesia: Topical Ethyl chloride.  With sterile technique and under real time ultrasound guidance:  Joint visualized.  21g 2 inch needle inserted posterior approach. Pictures taken for needle placement. Patient did have injection of 2 cc of 0.5% Marcaine, and 1cc of Kenalog 40 mg/dL. Completed without difficulty  Pain immediately resolved suggesting accurate placement of the medication.  Advised to call if fevers/chills, erythema, induration, drainage, or persistent bleeding.  Images permanently stored and available for review in the ultrasound  unit.  Impression: Technically successful ultrasound guided injection.  Procedure: Real-time Ultrasound Guided Injection of right carpal tunnel Device: GE Logiq Q7  Ultrasound guided injection is preferred based studies that show increased duration, increased effect, greater accuracy, decreased procedural pain, increased response rate with ultrasound guided versus blind injection.  Verbal informed consent obtained.  Time-out conducted.  Noted no overlying erythema, induration, or other signs of local infection.  Skin prepped in a sterile fashion.  Local anesthesia: Topical Ethyl chloride.  With sterile technique and under real time ultrasound guidance:  median nerve visualized.  23g 5/8 inch needle inserted distal to proximal approach into nerve sheath. Pictures taken nfor needle placement. Patient did have injection of 2 cc of 1% lidocaine, 1 cc of 0.5% Marcaine, and 1 cc of Kenalog 40 mg/dL. Completed without difficulty  Pain immediately resolved suggesting accurate placement of the medication.  Advised to call if fevers/chills, erythema, induration, drainage, or persistent bleeding.  Images permanently stored and available for review in the ultrasound unit.  Impression: Technically successful ultrasound guided injection.   Impression and Recommendations:     This case required medical decision making of moderate complexity. The above documentation has been reviewed and is accurate and complete Lyndal Pulley, DO       Note: This dictation was prepared with Dragon dictation along with smaller phrase technology. Any transcriptional errors that result from this process are unintentional.

## 2019-01-15 LAB — RHEUMATOID FACTOR: Rheumatoid fact SerPl-aCnc: <14 IU/mL (ref <14)

## 2019-01-15 LAB — ANGIOTENSIN CONVERTING ENZYME: Angiotensin-Converting Enzyme: 17 U/L (ref 9–67)

## 2019-01-15 LAB — PTH, INTACT AND CALCIUM
Calcium: 9.5 mg/dL (ref 8.6–10.3)
PTH: 35 pg/mL (ref 14–64)

## 2019-01-15 LAB — ANA: Anti Nuclear Antibody (ANA): NEGATIVE

## 2019-01-15 LAB — CALCIUM, IONIZED: Calcium, Ion: 5.25 mg/dL (ref 4.8–5.6)

## 2019-02-01 ENCOUNTER — Emergency Department (HOSPITAL_COMMUNITY)
Admission: EM | Admit: 2019-02-01 | Discharge: 2019-02-02 | Disposition: A | Discharge status: Home / Self Care | Payer: BC Managed Care – PPO | Attending: Emergency Medicine | Admitting: Emergency Medicine

## 2019-02-01 ENCOUNTER — Encounter (HOSPITAL_COMMUNITY): Payer: Self-pay

## 2019-02-01 ENCOUNTER — Other Ambulatory Visit: Payer: Self-pay

## 2019-02-01 DIAGNOSIS — M79641 Pain in right hand: Secondary | ICD-10-CM | POA: Diagnosis not present

## 2019-02-01 DIAGNOSIS — R509 Fever, unspecified: Secondary | ICD-10-CM | POA: Diagnosis not present

## 2019-02-01 DIAGNOSIS — M25531 Pain in right wrist: Secondary | ICD-10-CM | POA: Diagnosis not present

## 2019-02-01 DIAGNOSIS — L03113 Cellulitis of right upper limb: Secondary | ICD-10-CM | POA: Insufficient documentation

## 2019-02-01 DIAGNOSIS — Z87891 Personal history of nicotine dependence: Secondary | ICD-10-CM | POA: Insufficient documentation

## 2019-02-01 DIAGNOSIS — M7989 Other specified soft tissue disorders: Secondary | ICD-10-CM | POA: Diagnosis not present

## 2019-02-01 NOTE — ED Triage Notes (Signed)
Pt reports R hand pain, body aches, and fever that started yesterday. Reports a hx of cellulitis. Fever and body aches controlled with tylenol. A&Ox4.

## 2019-02-02 ENCOUNTER — Emergency Department (HOSPITAL_COMMUNITY): Payer: BC Managed Care – PPO

## 2019-02-02 DIAGNOSIS — M7989 Other specified soft tissue disorders: Secondary | ICD-10-CM | POA: Diagnosis not present

## 2019-02-02 DIAGNOSIS — M25531 Pain in right wrist: Secondary | ICD-10-CM | POA: Diagnosis not present

## 2019-02-02 LAB — URINALYSIS, ROUTINE W REFLEX MICROSCOPIC
Bacteria, UA: NONE SEEN
Bilirubin Urine: NEGATIVE
Glucose, UA: NEGATIVE mg/dL
Ketones, ur: NEGATIVE mg/dL
Leukocytes,Ua: NEGATIVE
Nitrite: NEGATIVE
Protein, ur: NEGATIVE mg/dL
Specific Gravity, Urine: 1.018 (ref 1.005–1.030)
pH: 5 (ref 5.0–8.0)

## 2019-02-02 LAB — COMPREHENSIVE METABOLIC PANEL
ALT: 16 U/L (ref 0–44)
AST: 16 U/L (ref 15–41)
Albumin: 3.5 g/dL (ref 3.5–5.0)
Alkaline Phosphatase: 72 U/L (ref 38–126)
Anion gap: 12 (ref 5–15)
BUN: 10 mg/dL (ref 6–20)
CO2: 21 mmol/L — ABNORMAL LOW (ref 22–32)
Calcium: 8.7 mg/dL — ABNORMAL LOW (ref 8.9–10.3)
Chloride: 103 mmol/L (ref 98–111)
Creatinine, Ser: 1 mg/dL (ref 0.61–1.24)
GFR calc Af Amer: >60 mL/min (ref >60)
GFR calc non Af Amer: >60 mL/min (ref >60)
Glucose, Bld: 173 mg/dL — ABNORMAL HIGH (ref 70–99)
Potassium: 3.5 mmol/L (ref 3.5–5.1)
Sodium: 136 mmol/L (ref 135–145)
Total Bilirubin: 0.6 mg/dL (ref 0.3–1.2)
Total Protein: 7.3 g/dL (ref 6.5–8.1)

## 2019-02-02 LAB — CBC WITH DIFFERENTIAL/PLATELET
Abs Immature Granulocytes: 0.06 10*3/uL (ref 0.00–0.07)
Basophils Absolute: 0 10*3/uL (ref 0.0–0.1)
Basophils Relative: 0 %
Eosinophils Absolute: 0.1 10*3/uL (ref 0.0–0.5)
Eosinophils Relative: 1 %
HCT: 47.9 % (ref 39.0–52.0)
Hemoglobin: 15.6 g/dL (ref 13.0–17.0)
Immature Granulocytes: 0 %
Lymphocytes Relative: 25 %
Lymphs Abs: 3.7 10*3/uL (ref 0.7–4.0)
MCH: 28.5 pg (ref 26.0–34.0)
MCHC: 32.6 g/dL (ref 30.0–36.0)
MCV: 87.6 fL (ref 80.0–100.0)
Monocytes Absolute: 1.5 10*3/uL — ABNORMAL HIGH (ref 0.1–1.0)
Monocytes Relative: 10 %
Neutro Abs: 9.8 10*3/uL — ABNORMAL HIGH (ref 1.7–7.7)
Neutrophils Relative %: 64 %
Platelets: 286 10*3/uL (ref 150–400)
RBC: 5.47 MIL/uL (ref 4.22–5.81)
RDW: 14.3 % (ref 11.5–15.5)
WBC: 15.1 10*3/uL — ABNORMAL HIGH (ref 4.0–10.5)
nRBC: 0 % (ref 0.0–0.2)

## 2019-02-02 LAB — LACTIC ACID, PLASMA
Lactic Acid, Venous: 2 mmol/L (ref 0.5–1.9)
Lactic Acid, Venous: 1.2 mmol/L (ref 0.5–1.9)

## 2019-02-02 MED ORDER — SODIUM CHLORIDE 0.9 % IV BOLUS
1000.0000 mL | Freq: Once | INTRAVENOUS | Status: AC
  Administered 2019-02-02: 1000 mL via INTRAVENOUS

## 2019-02-02 MED ORDER — OXYCODONE-ACETAMINOPHEN 5-325 MG PO TABS
1.0000 | ORAL_TABLET | Freq: Once | ORAL | Status: AC
  Administered 2019-02-02: 01:00:00 1 via ORAL
  Filled 2019-02-02: qty 1

## 2019-02-02 MED ORDER — TRAMADOL HCL 50 MG PO TABS: 50.0000 mg | ORAL_TABLET | Freq: Four times a day (QID) | ORAL | 0 refills | Status: DC | PRN

## 2019-02-02 MED ORDER — CLINDAMYCIN HCL 150 MG PO CAPS: 300.0000 mg | ORAL_CAPSULE | Freq: Three times a day (TID) | ORAL | 0 refills | Status: DC

## 2019-02-02 MED ORDER — CLINDAMYCIN PHOSPHATE 600 MG/50ML IV SOLN
600.0000 mg | Freq: Once | INTRAVENOUS | Status: AC
  Administered 2019-02-02: 02:00:00 600 mg via INTRAVENOUS
  Filled 2019-02-02: qty 50

## 2019-02-02 MED ORDER — KETOROLAC TROMETHAMINE 30 MG/ML IJ SOLN
30.0000 mg | Freq: Once | INTRAMUSCULAR | Status: AC
  Administered 2019-02-02: 30 mg via INTRAVENOUS
  Filled 2019-02-02: qty 1

## 2019-02-02 NOTE — Discharge Instructions (Signed)
Take the prescribed medication as directed. Follow-up with your specialist. Return to the ED for new or worsening symptoms-- developing redness, swelling, recurrent fever, etc.

## 2019-02-02 NOTE — ED Notes (Signed)
Pt aware that urine sample is needed.  

## 2019-02-02 NOTE — ED Notes (Signed)
Pt verbalized discharge instructions and follow up care. Alert and ambulatory. No IV. Has ride home 

## 2019-02-02 NOTE — ED Notes (Signed)
Urine and culture sent to lab  

## 2019-02-02 NOTE — ED Notes (Signed)
Pt transported to XR.  

## 2019-02-02 NOTE — ED Provider Notes (Signed)
[facility]-EMERGENCY DEPT Provider Note   CSN: [id number] Arrival date & time: 02/01/19  1840     History   Chief Complaint Chief Complaint  Patient presents with  . Hand Pain  . Fever    HPI Jacob Nixon. is a 28 y.o. male.     The history is provided by the patient and medical records.  Hand Pain  Fever    28 year old male presenting to the ED with right wrist pain.  States he has been having issues with this over the past several years, he had carpal tunnel surgery several years ago with Dr. August Saucer.  He is now seeing a new specialist, Dr. Katrinka Blazing.  He has been evaluated for possible rheumatologic disease with negative results.  He states his right wrist has been swelling more than normal and today he developed a fever of 102F.  States he has had some body aches and chills.  States otherwise he has been feeling well, specifically he has not had any cough, nasal congestion, sore throat, nausea, vomiting, or diarrhea.  He has not had any sick contacts or known COVID exposures.  He has a history of recurrent cellulitis of the right upper extremity, usually begins on the hand and start radiating upward.  States it feels like this, however his hand is not red currently.  Past Medical History:  Diagnosis Date  . Bronchitis   . Cellulitis of right hand - RECURRENT   . Kidney stones     Patient Active Problem List   Diagnosis Date Noted  . Labral tear of shoulder 12/15/2018  . Hyponatremia 10/26/2016  . Hypokalemia 10/26/2016  . Elevated glucose level   . Cellulitis of hand 08/04/2016  . Carpal tunnel syndrome on left 05/08/2016  . Abscess of upper arm   . Cellulitis 01/22/2016  . Leukocytosis 01/22/2016  . Neuropathy 01/22/2016  . Nephrolithiasis 01/22/2016  . Cellulitis of right hand   . Cellulitis of forearm, right 07/30/2015    Past Surgical History:  Procedure Laterality Date  . CARPAL TUNNEL RELEASE Left 04/09/2016   Procedure: CARPAL  TUNNEL RELEASE;  Surgeon: Cammy Copa, MD;  Location: [facility] OR;  Service: Orthopedics;  Laterality: Left;  . HAND SURGERY    . KIDNEY STONE SURGERY     lithortripsy        Home Medications    Prior to Admission medications   Medication Sig Start Date End Date Taking? Authorizing Provider  doxycycline (VIBRAMYCIN) 100 MG capsule Take 1 capsule (100 mg total) by mouth 2 (two) times daily. One po bid x 7 days Patient not taking: Reported on 02/02/2019 09/20/18   Fayrene Helper, PA-C  HYDROcodone-acetaminophen (NORCO/VICODIN) 5-325 MG tablet Take 1 tablet by mouth every 6 (six) hours as needed for up to 15 doses. Patient not taking: Reported on 02/02/2019 10/19/18   Virgina Norfolk, DO  ibuprofen (ADVIL) 600 MG tablet Take 1 tablet (600 mg total) by mouth every 6 (six) hours as needed. Patient not taking: Reported on 02/02/2019 09/20/18   Fayrene Helper, PA-C  meloxicam (MOBIC) 15 MG tablet Take 1 tablet (15 mg total) by mouth daily. Patient not taking: Reported on 02/02/2019 01/13/19   Judi Saa, DO  montelukast (SINGULAIR) 10 MG tablet Take 1 tablet (10 mg total) by mouth at bedtime. Patient not taking: Reported on 02/02/2019 12/15/18   Judi Saa, DO  nitroGLYCERIN (NITRODUR - DOSED IN MG/24 HR) 0.2 mg/hr patch 1/4 patch daily Patient not taking:  Reported on 02/02/2019 12/15/18   Judi SaaSmith, Zachary M, DO  ondansetron (ZOFRAN ODT) 4 MG disintegrating tablet Take 1 tablet (4 mg total) by mouth every 8 (eight) hours as needed for nausea or vomiting. Patient not taking: Reported on 02/02/2019 10/11/18   Horton, Mayer Maskerourtney F, MD  oxyCODONE-acetaminophen (PERCOCET/ROXICET) 5-325 MG tablet Take 1 tablet by mouth every 6 (six) hours as needed for severe pain. Patient not taking: Reported on 02/02/2019 10/11/18   Horton, Mayer Maskerourtney F, MD  tamsulosin (FLOMAX) 0.4 MG CAPS capsule Take 1 capsule (0.4 mg total) by mouth daily. Patient not taking: Reported on 02/02/2019 10/11/18   Horton, Mayer Maskerourtney F, MD    Family  History Family History  Problem Relation Age of Onset  . Hypertension Other   . Diabetes Father     Social History Social History   Tobacco Use  . Smoking status: Former Games developermoker  . Smokeless tobacco: Never Used  Substance Use Topics  . Alcohol use: Yes    Comment: 2-3 times a month   . Drug use: No     Allergies   Coconut flavor, Fentanyl, and Penicillins   Review of Systems Review of Systems  Constitutional: Positive for fever.  Musculoskeletal: Positive for arthralgias.  All other systems reviewed and are negative.    Physical Exam Updated Vital Signs BP 111/70   Pulse 88   Temp 98.7 F (37.1 C) (Oral)   Resp 17   SpO2 99%   Physical Exam Vitals signs and nursing note reviewed.  Constitutional:      Appearance: He is well-developed.  HENT:     Head: Normocephalic and atraumatic.  Eyes:     Conjunctiva/sclera: Conjunctivae normal.     Pupils: Pupils are equal, round, and reactive to light.  Neck:     Musculoskeletal: Normal range of motion.  Cardiovascular:     Rate and Rhythm: Normal rate and regular rhythm.     Heart sounds: Normal heart sounds.  Pulmonary:     Effort: Pulmonary effort is normal.     Breath sounds: Normal breath sounds.  Abdominal:     General: Bowel sounds are normal.     Palpations: Abdomen is soft.  Musculoskeletal: Normal range of motion.     Comments: Right wrist with some diffuse swelling compared with left, there is no overlying skin changes or warmth to touch, able to range wrist without issue but some pain elicited, no gross deformity or signs of trauma, normal distal sensation and cap refill  Skin:    General: Skin is warm and dry.  Neurological:     Mental Status: He is alert and oriented to person, place, and time.        ED Treatments / Results  Labs (all labs ordered are listed, but only abnormal results are displayed) Labs Reviewed  LACTIC ACID, PLASMA - Abnormal; Notable for the following components:       Result Value   Lactic Acid, Venous 2.0 (*)    All other components within normal limits  COMPREHENSIVE METABOLIC PANEL - Abnormal; Notable for the following components:   CO2 21 (*)    Glucose, Bld 173 (*)    Calcium 8.7 (*)    All other components within normal limits  CBC WITH DIFFERENTIAL/PLATELET - Abnormal; Notable for the following components:   WBC 15.1 (*)    Neutro Abs 9.8 (*)    Monocytes Absolute 1.5 (*)    All other components within normal limits  URINALYSIS, ROUTINE W REFLEX  MICROSCOPIC - Abnormal; Notable for the following components:   Hgb urine dipstick MODERATE (*)    All other components within normal limits  CULTURE, BLOOD (ROUTINE X 2)  CULTURE, BLOOD (ROUTINE X 2)  LACTIC ACID, PLASMA    EKG None  Radiology Dg Wrist Complete Right  Result Date: 02/02/2019 CLINICAL DATA:  Pain and swelling for 2 weeks, no known injury, initial encounter EXAM: RIGHT WRIST - COMPLETE 3+ VIEW COMPARISON:  None. FINDINGS: There is no evidence of fracture or dislocation. There is no evidence of arthropathy or other focal bone abnormality. Soft tissues are unremarkable. IMPRESSION: No acute abnormality noted. Electronically Signed   By: Inez Catalina M.D.   On: 02/02/2019 01:09    Procedures Procedures (including critical care time)  Medications Ordered in ED Medications  oxyCODONE-acetaminophen (PERCOCET/ROXICET) 5-325 MG per tablet 1 tablet (1 tablet Oral Given 02/02/19 0100)  clindamycin (CLEOCIN) IVPB 600 mg (0 mg Intravenous Stopped 02/02/19 0258)  sodium chloride 0.9 % bolus 1,000 mL (0 mLs Intravenous Stopped 02/02/19 0350)  ketorolac (TORADOL) 30 MG/ML injection 30 mg (30 mg Intravenous Given 02/02/19 0215)     Initial Impression / Assessment and Plan / ED Course  I have reviewed the triage vital signs and the nursing notes.  Pertinent labs & imaging results that were available during my care of the patient were reviewed by me and considered in my medical decision making  (see chart for details).  28 year old male presenting to the ED with right hand pain and fever.  Reports history of recurrent cellulitis of the right hand and arm in the past.  States current symptoms feel similar.  He is afebrile and nontoxic in appearance here.  Right hand is overall normal in appearance aside from mild swelling (see photo above).  There is no erythema, induration, warmth to touch.  I am able to fully range his wrist but with some pain elicited.  Compartments are soft and easily compressible.  No tissue crepitus.  Do not feel this currently represents septic joint.  Labs have been sent with white count of 15,000 and minimally elevated lactate at 2.0.  Blood cultures have been sent.  Discussed options with patient, given rather benign appearance of the hand at this time I do feel it would be reasonable to give IV fluids and dose of IV antibiotics here and plan to discharge with oral antibiotics.  This has worked well for him in the past, he is agreeable.  4:17 AM Repeat lactate 1.2.  Vitals have remained stable.  Patient will be discharged home with antibiotics.  He will need to monitor symptoms closely.  He will follow-up closely with his specialist.  Return here for any new or acute changes including new erythema, worsening swelling, recurrent fevers, etc.  Final Clinical Impressions(s) / ED Diagnoses   Final diagnoses:  Cellulitis of right upper extremity    ED Discharge Orders         Ordered    clindamycin (CLEOCIN) 150 MG capsule  3 times daily     02/02/19 0420    traMADol (ULTRAM) 50 MG tablet  Every 6 hours PRN     02/02/19 0422           Larene Pickett, PA-C 02/02/19 0426    Mesner, Corene Cornea, MD 02/02/19 [phone]

## 2019-02-02 NOTE — ED Notes (Signed)
Date and time results received: 02/02/19 0125   Test: lactic  Critical Value: 2.0  Name of Provider Notified: Lattie Haw, [place]

## 2019-02-07 LAB — CULTURE, BLOOD (ROUTINE X 2)
Culture: NO GROWTH
Culture: NO GROWTH
Special Requests: ADEQUATE

## 2019-02-12 ENCOUNTER — Encounter: Payer: Self-pay | Admitting: Family Medicine

## 2019-02-12 ENCOUNTER — Other Ambulatory Visit: Payer: Self-pay

## 2019-02-12 ENCOUNTER — Ambulatory Visit (INDEPENDENT_AMBULATORY_CARE_PROVIDER_SITE_OTHER): Payer: BC Managed Care – PPO | Admitting: Family Medicine

## 2019-02-12 VITALS — BP 128/80 | HR 78 | Ht 71.0 in | Wt 264.0 lb

## 2019-02-12 DIAGNOSIS — M25531 Pain in right wrist: Secondary | ICD-10-CM | POA: Diagnosis not present

## 2019-02-12 DIAGNOSIS — S43432D Superior glenoid labrum lesion of left shoulder, subsequent encounter: Secondary | ICD-10-CM

## 2019-02-12 DIAGNOSIS — S43432S Superior glenoid labrum lesion of left shoulder, sequela: Secondary | ICD-10-CM | POA: Diagnosis not present

## 2019-02-12 DIAGNOSIS — G5601 Carpal tunnel syndrome, right upper limb: Secondary | ICD-10-CM | POA: Diagnosis not present

## 2019-02-12 MED ORDER — VENLAFAXINE HCL ER 37.5 MG PO CP24: 37.5000 mg | ORAL_CAPSULE | Freq: Every day | ORAL | 0 refills | Status: DC

## 2019-02-12 NOTE — Progress Notes (Signed)
[name] [address] [place], [place] [address] Phone: [phone] Subjective:    I'm seeing this patient by the request  of:    CC: Left shoulder and right hand pain  [id number]   01/13/2019 Patient given injection today and tolerated the procedure well.  Am hoping that this will be beneficial.  If he does not make any significant improvement PRP is another suggestion.  Patient is failed all other conservative therapy including formal physical therapy and other injections.  Follow-up again in 4 weeks.  Carpal tunnel was injected today.  This is right side.  In January 13, 2019.   02/12/2019 Jacob Nixon. is a 28 y.o. male coming in with complaint of left shoulder and right hand pain. Shoulder hasn't made any progress and the hand is worse. Wearing the brace mainly at night but the past 2-3 weeks it has gotten worse. More numbness in the fingers.  Right hand was seen in the emergency department and was diagnosed with another cellulitis.  This will be patient's third flare without any truly having it.  Patient did not take the antibiotics and continue to improve.  Patient states that the swelling is down but continues to have pain.  Past medical history is significant for a carpal tunnel release but never had improvement.  Patient's left shoulder found to have a degenerative labral tear posteriorly.  This was diagnosed with a MRI.  Patient was to consider the possibility of PRP with him failing all other conservative therapy including formal physical therapy and steroid injections.  Patient would like to consider surgical intervention at this time     Past Medical History:  Diagnosis Date   Bronchitis    Cellulitis of right hand - RECURRENT    Kidney stones    Past Surgical History:  Procedure Laterality Date   CARPAL TUNNEL RELEASE Left 04/09/2016   Procedure: CARPAL TUNNEL RELEASE;  Surgeon: Cammy Copa, MD;  Location: [facility] OR;   Service: Orthopedics;  Laterality: Left;   HAND SURGERY     KIDNEY STONE SURGERY     lithortripsy   Social History   Socioeconomic History   Marital status: Married    Spouse name: Not on file   Number of children: Not on file   Years of education: Not on file   Highest education level: Not on file  Occupational History   Not on file  Social Needs   Financial resource strain: Not on file   Food insecurity    Worry: Not on file    Inability: Not on file   Transportation needs    Medical: Not on file    Non-medical: Not on file  Tobacco Use   Smoking status: Former Smoker   Smokeless tobacco: Never Used  Substance and Sexual Activity   Alcohol use: Yes    Comment: 2-3 times a month    Drug use: No   Sexual activity: Not on file  Lifestyle   Physical activity    Days per week: Not on file    Minutes per session: Not on file   Stress: Not on file  Relationships   Social connections    Talks on phone: Not on file    Gets together: Not on file    Attends religious service: Not on file    Active member of club or organization: Not on file    Attends meetings of clubs or organizations: Not on file  Relationship status: Not on file  Other Topics Concern   Not on file  Social History Narrative   Not on file   Allergies  Allergen Reactions   Coconut Flavor     Scratchy throat   Fentanyl     Pt reports feeling sick with fentanyl   Penicillins     DID THE REACTION INVOLVE: Swelling of the face/tongue/throat, SOB, or low BP? Y Sudden or severe rash/hives, skin peeling, or the inside of the mouth or nose? N  Did it require medical treatment? Y When did it last happen?January 2020 If all above answers are NO, may proceed with cephalosporin use.    Family History  Problem Relation Age of Onset   Hypertension Other    Diabetes Father      Current Outpatient Medications (Cardiovascular):    nitroGLYCERIN (NITRODUR - DOSED IN MG/24  HR) 0.2 mg/hr patch, 1/4 patch daily  Current Outpatient Medications (Respiratory):    montelukast (SINGULAIR) 10 MG tablet, Take 1 tablet (10 mg total) by mouth at bedtime.  Current Outpatient Medications (Analgesics):    HYDROcodone-acetaminophen (NORCO/VICODIN) 5-325 MG tablet, Take 1 tablet by mouth every 6 (six) hours as needed for up to 15 doses.   ibuprofen (ADVIL) 600 MG tablet, Take 1 tablet (600 mg total) by mouth every 6 (six) hours as needed.   meloxicam (MOBIC) 15 MG tablet, Take 1 tablet (15 mg total) by mouth daily.   oxyCODONE-acetaminophen (PERCOCET/ROXICET) 5-325 MG tablet, Take 1 tablet by mouth every 6 (six) hours as needed for severe pain.   traMADol (ULTRAM) 50 MG tablet, Take 1 tablet (50 mg total) by mouth every 6 (six) hours as needed.   Current Outpatient Medications (Other):    clindamycin (CLEOCIN) 150 MG capsule, Take 2 capsules (300 mg total) by mouth 3 (three) times daily. May dispense as 150mg  capsules   doxycycline (VIBRAMYCIN) 100 MG capsule, Take 1 capsule (100 mg total) by mouth 2 (two) times daily. One po bid x 7 days   ondansetron (ZOFRAN ODT) 4 MG disintegrating tablet, Take 1 tablet (4 mg total) by mouth every 8 (eight) hours as needed for nausea or vomiting.   tamsulosin (FLOMAX) 0.4 MG CAPS capsule, Take 1 capsule (0.4 mg total) by mouth daily.   venlafaxine XR (EFFEXOR XR) 37.5 MG 24 hr capsule, Take 1 capsule (37.5 mg total) by mouth daily with breakfast.    Past medical history, social, surgical and family history all reviewed in electronic medical record.  No pertanent information unless stated regarding to the chief complaint.   Review of Systems:  No headache, visual changes, nausea, vomiting, diarrhea, constipation, dizziness, abdominal pain, skin rash, fevers, chills, night sweats, weight loss, swollen lymph nodes, body aches, joint swelling, muscle aches, chest pain, shortness of breath, mood changes.   Objective  Blood  pressure 128/80, pulse 78, height 5\' 11"  (1.803 m), weight 264 lb (119.7 kg), SpO2 97 %.   General: No apparent distress alert and oriented x3 mood and affect normal, dressed appropriately.  HEENT: Pupils equal, extraocular movements intact  Respiratory: Patient's speak in full sentences and does not appear short of breath  Cardiovascular: No lower extremity edema, non tender, no erythema  Skin: Warm dry intact with no signs of infection or rash on extremities or on axial skeleton.  Abdomen: Soft nontender  Neuro: Cranial nerves II through XII are intact, neurovascularly intact in all extremities with 2+ DTRs and 2+ pulses.  Lymph: No lymphadenopathy of posterior or anterior cervical  chain or axillae bilaterally.  Gait normal with good balance and coordination.  MSK:  Non tender with full range of motion and good stability and symmetric strength and tone of shoulders, elbows,  hip, knee and ankles bilaterally.  Right wrist exam has some tenderness to palpation more on around the palmar aspect of the wrist.  Positive Tinel's.  Patient's grip strength is intact but seems to be somewhat painful.  Seems to be neurovascularly intact distally at the point with good capillary refill.  No signs of any cellulitic changes at this time.  Left shoulder positive impingement.  Positive O'Brien's.  Near full range of motion lacking the last 5 degrees of forward flexion in the last 5 degrees of internal rotation   Impression and Recommendations:     This case required medical decision making of moderate complexity. The above documentation has been reviewed and is accurate and complete Lyndal Pulley, DO       Note: This dictation was prepared with Dragon dictation along with smaller phrase technology. Any transcriptional errors that result from this process are unintentional.

## 2019-02-12 NOTE — Assessment & Plan Note (Signed)
No improvement at this time.  Patient does not want to consider PRP.  It is a small fraying of the posterior labral but at this moment and secondary to patient's livelihood would like referral to orthopedic surgery to discuss surgical intervention

## 2019-02-12 NOTE — Assessment & Plan Note (Signed)
Patient did not respond well to the carpal tunnel, patient also had another flare of the swelling and inflammation of the wrist.  I would like patient to go and have a nerve conduction test to further evaluate for the possibility of reflux and pathetic dystrophy.  Patient is in agreement with this plan.  Started on small dose of Effexor in the meantime.  We will follow-up after the study.

## 2019-02-12 NOTE — Patient Instructions (Addendum)
Neurology will call you Ortho will call you See me again in 4-5 weeks

## 2019-02-16 ENCOUNTER — Other Ambulatory Visit: Payer: Self-pay | Admitting: *Deleted

## 2019-02-23 ENCOUNTER — Other Ambulatory Visit: Payer: Self-pay | Admitting: *Deleted

## 2019-02-23 DIAGNOSIS — G5601 Carpal tunnel syndrome, right upper limb: Secondary | ICD-10-CM

## 2019-03-04 DIAGNOSIS — R202 Paresthesia of skin: Secondary | ICD-10-CM | POA: Diagnosis not present

## 2019-03-13 NOTE — Progress Notes (Deleted)
[facility] [address] [place], [place] [address] Phone: [phone] Subjective:    I'm seeing this patient by the request  of:    CC:   [name]  Jacob Nixon. is a 28 y.o. male coming in with complaint of ***  Onset-  Location Duration-  Character- Aggravating factors- Reliving factors-  Therapies tried-  Severity-   Still awaiting nerve conduction test  Past Medical History:  Diagnosis Date  . Bronchitis   . Cellulitis of right hand - RECURRENT   . Kidney stones    Past Surgical History:  Procedure Laterality Date  . CARPAL TUNNEL RELEASE Left 04/09/2016   Procedure: CARPAL TUNNEL RELEASE;  Surgeon: Cammy Copa, MD;  Location: [facility] OR;  Service: Orthopedics;  Laterality: Left;  . HAND SURGERY    . KIDNEY STONE SURGERY     lithortripsy   Social History   Socioeconomic History  . Marital status: Married    Spouse name: Not on file  . Number of children: Not on file  . Years of education: Not on file  . Highest education level: Not on file  Occupational History  . Not on file  Social Needs  . Financial resource strain: Not on file  . Food insecurity    Worry: Not on file    Inability: Not on file  . Transportation needs    Medical: Not on file    Non-medical: Not on file  Tobacco Use  . Smoking status: Former Games developer  . Smokeless tobacco: Never Used  Substance and Sexual Activity  . Alcohol use: Yes    Comment: 2-3 times a month   . Drug use: No  . Sexual activity: Not on file  Lifestyle  . Physical activity    Days per week: Not on file    Minutes per session: Not on file  . Stress: Not on file  Relationships  . Social Musician on phone: Not on file    Gets together: Not on file    Attends religious service: Not on file    Active member of club or organization: Not on file    Attends meetings of clubs or organizations: Not on file    Relationship status: Not on file  Other Topics  Concern  . Not on file  Social History Narrative  . Not on file   Allergies  Allergen Reactions  . Coconut Flavor     Scratchy throat  . Fentanyl     Pt reports feeling sick with fentanyl  . Penicillins     DID THE REACTION INVOLVE: Swelling of the face/tongue/throat, SOB, or low BP? Y Sudden or severe rash/hives, skin peeling, or the inside of the mouth or nose? N  Did it require medical treatment? Y When did it last happen?January 2020 If all above answers are "NO", may proceed with cephalosporin use.    Family History  Problem Relation Age of Onset  . Hypertension Other   . Diabetes Father      Current Outpatient Medications (Cardiovascular):  .  nitroGLYCERIN (NITRODUR - DOSED IN MG/24 HR) 0.2 mg/hr patch, 1/4 patch daily  Current Outpatient Medications (Respiratory):  .  montelukast (SINGULAIR) 10 MG tablet, Take 1 tablet (10 mg total) by mouth at bedtime.  Current Outpatient Medications (Analgesics):  .  HYDROcodone-acetaminophen (NORCO/VICODIN) 5-325 MG tablet, Take 1 tablet by mouth every 6 (six) hours as needed for up to 15 doses. Marland Kitchen  ibuprofen (ADVIL) 600 MG tablet, Take 1 tablet (600 mg total) by mouth every 6 (six) hours as needed. .  meloxicam (MOBIC) 15 MG tablet, Take 1 tablet (15 mg total) by mouth daily. Marland Kitchen  oxyCODONE-acetaminophen (PERCOCET/ROXICET) 5-325 MG tablet, Take 1 tablet by mouth every 6 (six) hours as needed for severe pain. .  traMADol (ULTRAM) 50 MG tablet, Take 1 tablet (50 mg total) by mouth every 6 (six) hours as needed.   Current Outpatient Medications (Other):  .  clindamycin (CLEOCIN) 150 MG capsule, Take 2 capsules (300 mg total) by mouth 3 (three) times daily. May dispense as 150mg  capsules .  doxycycline (VIBRAMYCIN) 100 MG capsule, Take 1 capsule (100 mg total) by mouth 2 (two) times daily. One po bid x 7 days .  ondansetron (ZOFRAN ODT) 4 MG disintegrating tablet, Take 1 tablet (4 mg total) by mouth every 8 (eight) hours as needed  for nausea or vomiting. .  tamsulosin (FLOMAX) 0.4 MG CAPS capsule, Take 1 capsule (0.4 mg total) by mouth daily. Marland Kitchen  venlafaxine XR (EFFEXOR XR) 37.5 MG 24 hr capsule, Take 1 capsule (37.5 mg total) by mouth daily with breakfast.    Past medical history, social, surgical and family history all reviewed in electronic medical record.  No pertanent information unless stated regarding to the chief complaint.   Review of Systems:  No headache, visual changes, nausea, vomiting, diarrhea, constipation, dizziness, abdominal pain, skin rash, fevers, chills, night sweats, weight loss, swollen lymph nodes, body aches, joint swelling, muscle aches, chest pain, shortness of breath, mood changes.   Objective  There were no vitals taken for this visit. Systems examined below as of    General: No apparent distress alert and oriented x3 mood and affect normal, dressed appropriately.  HEENT: Pupils equal, extraocular movements intact  Respiratory: Patient's speak in full sentences and does not appear short of breath  Cardiovascular: No lower extremity edema, non tender, no erythema  Skin: Warm dry intact with no signs of infection or rash on extremities or on axial skeleton.  Abdomen: Soft nontender  Neuro: Cranial nerves II through XII are intact, neurovascularly intact in all extremities with 2+ DTRs and 2+ pulses.  Lymph: No lymphadenopathy of posterior or anterior cervical chain or axillae bilaterally.  Gait normal with good balance and coordination.  MSK:  Non tender with full range of motion and good stability and symmetric strength and tone of shoulders, elbows, wrist, hip, knee and ankles bilaterally.     Impression and Recommendations:     This case required medical decision making of moderate complexity. The above documentation has been reviewed and is accurate and complete Lyndal Pulley, DO       Note: This dictation was prepared with Dragon dictation along with smaller phrase  technology. Any transcriptional errors that result from this process are unintentional.

## 2019-03-15 ENCOUNTER — Ambulatory Visit: Payer: BC Managed Care – PPO | Admitting: Family Medicine

## 2019-03-17 ENCOUNTER — Telehealth: Payer: Self-pay | Admitting: *Deleted

## 2019-03-17 NOTE — Telephone Encounter (Signed)
Pt called stating he injured his back 3-4 days ago and he's got numbness going down his leg. Pt is requesting a muscle relaxer or something else to help get him by until he can get in to see Dr. Tamala Julian.

## 2019-03-18 MED ORDER — PREDNISONE 50 MG PO TABS: 50.0000 mg | ORAL_TABLET | Freq: Every day | ORAL | 0 refills | Status: DC

## 2019-03-18 MED ORDER — TIZANIDINE HCL 4 MG PO TABS: 4.0000 mg | ORAL_TABLET | Freq: Every evening | ORAL | 2 refills | Status: AC

## 2019-03-18 NOTE — Telephone Encounter (Signed)
Sent in prednisone and zanaflex

## 2019-03-23 ENCOUNTER — Encounter: Payer: BC Managed Care – PPO | Admitting: Neurology

## 2019-03-24 DIAGNOSIS — R200 Anesthesia of skin: Secondary | ICD-10-CM | POA: Diagnosis not present

## 2019-03-25 ENCOUNTER — Encounter: Payer: BLUE CROSS/BLUE SHIELD | Admitting: Diagnostic Neuroimaging

## 2019-03-25 DIAGNOSIS — R202 Paresthesia of skin: Secondary | ICD-10-CM | POA: Diagnosis not present

## 2019-03-31 DIAGNOSIS — M542 Cervicalgia: Secondary | ICD-10-CM | POA: Diagnosis not present

## 2019-04-04 ENCOUNTER — Other Ambulatory Visit: Payer: Self-pay

## 2019-04-04 ENCOUNTER — Encounter (HOSPITAL_COMMUNITY): Payer: Self-pay

## 2019-04-04 ENCOUNTER — Emergency Department (HOSPITAL_COMMUNITY)
Admission: EM | Admit: 2019-04-04 | Discharge: 2019-04-05 | Disposition: A | Discharge status: Home / Self Care | Payer: BC Managed Care – PPO | Attending: Emergency Medicine | Admitting: Emergency Medicine

## 2019-04-04 DIAGNOSIS — Z79899 Other long term (current) drug therapy: Secondary | ICD-10-CM | POA: Diagnosis not present

## 2019-04-04 DIAGNOSIS — R112 Nausea with vomiting, unspecified: Secondary | ICD-10-CM | POA: Insufficient documentation

## 2019-04-04 DIAGNOSIS — R197 Diarrhea, unspecified: Secondary | ICD-10-CM | POA: Insufficient documentation

## 2019-04-04 DIAGNOSIS — Z87891 Personal history of nicotine dependence: Secondary | ICD-10-CM | POA: Diagnosis not present

## 2019-04-04 MED ORDER — SODIUM CHLORIDE 0.9 % IV BOLUS (SEPSIS)
1000.0000 mL | Freq: Once | INTRAVENOUS | Status: AC
  Administered 2019-04-04: 1000 mL via INTRAVENOUS

## 2019-04-04 MED ORDER — ONDANSETRON HCL 4 MG/2ML IJ SOLN
4.0000 mg | Freq: Once | INTRAMUSCULAR | Status: AC
  Administered 2019-04-04: 4 mg via INTRAVENOUS
  Filled 2019-04-04: qty 2

## 2019-04-04 MED ORDER — SODIUM CHLORIDE 0.9 % IV SOLN: 1000.0000 mL | INTRAVENOUS | Status: DC

## 2019-04-04 MED ORDER — SODIUM CHLORIDE 0.9% FLUSH: 3.0000 mL | Freq: Once | INTRAVENOUS | Status: DC

## 2019-04-04 NOTE — ED Triage Notes (Signed)
Patient arrived stating that a 3am today he began having NVD. Declines anyone else being sick at this time. States he tried zofran at home with no relief.

## 2019-04-04 NOTE — ED Provider Notes (Signed)
[facility]-EMERGENCY DEPT Provider Note  CSN: [id number] Arrival date & time: 04/04/19 2245  Chief Complaint(s) Vomiting and Diarrhea  HPI Jacob Nixon. is a 28 y.o. male who presents to the emergency department with 20 hours of nonbloody nonbilious emesis and diarrhea.  Patient reports that she ate takeout around 6 PM.  He has not been able to keep anything down since 3 AM this morning.  He is endorsing associated abdominal discomfort but no overt pain.  He relates this to all the emesis.  Denies any fevers or chills.  Denies any known sick contacts.  No Covid exposure.  Patient also reports that he was recently started on nortriptyline 1 week ago and missed 2 days.  Denies any other physical complaints including chest pain or shortness of breath.  HPI  Past Medical History Past Medical History:  Diagnosis Date  . Bronchitis   . Cellulitis of right hand - RECURRENT   . Kidney stones    Patient Active Problem List   Diagnosis Date Noted  . Labral tear of shoulder 12/15/2018  . Hyponatremia 10/26/2016  . Hypokalemia 10/26/2016  . Elevated glucose level   . Cellulitis of hand 08/04/2016  . Carpal tunnel syndrome on right 05/08/2016  . Abscess of upper arm   . Cellulitis 01/22/2016  . Leukocytosis 01/22/2016  . Neuropathy 01/22/2016  . Nephrolithiasis 01/22/2016  . Cellulitis of right hand   . Cellulitis of forearm, right 07/30/2015   Home Medication(s) Prior to Admission medications   Medication Sig Start Date End Date Taking? Authorizing Provider  bismuth subsalicylate (PEPTO BISMOL) 262 MG/15ML suspension Take 30 mLs by mouth every 6 (six) hours as needed for indigestion or diarrhea or loose stools.   Yes [provider]  nortriptyline (PAMELOR) 25 MG capsule Take 25 mg by mouth at bedtime. 03/25/19  Yes [provider]  ondansetron (ZOFRAN ODT) 4 MG disintegrating tablet Take 1 tablet (4 mg total) by mouth every 8 (eight) hours as  needed for nausea or vomiting. 10/11/18  Yes Horton, Mayer Masker, MD  clindamycin (CLEOCIN) 150 MG capsule Take 2 capsules (300 mg total) by mouth 3 (three) times daily. May dispense as 150mg  capsules Patient not taking: Reported on 04/05/2019 02/02/19   02/04/19, PA-C  meloxicam (MOBIC) 15 MG tablet Take 1 tablet (15 mg total) by mouth daily. Patient not taking: Reported on 04/05/2019 01/13/19   01/15/19, DO  montelukast (SINGULAIR) 10 MG tablet Take 1 tablet (10 mg total) by mouth at bedtime. Patient not taking: Reported on 04/05/2019 12/15/18   12/17/18, DO  nitroGLYCERIN (NITRODUR - DOSED IN MG/24 HR) 0.2 mg/hr patch 1/4 patch daily Patient not taking: Reported on 04/05/2019 12/15/18   12/17/18, DO  predniSONE (DELTASONE) 50 MG tablet Take 1 tablet (50 mg total) by mouth daily. Patient not taking: Reported on 04/05/2019 03/18/19   03/20/19, DO  tamsulosin (FLOMAX) 0.4 MG CAPS capsule Take 1 capsule (0.4 mg total) by mouth daily. Patient not taking: Reported on 04/05/2019 10/11/18   Horton, 10/13/18, MD  traMADol (ULTRAM) 50 MG tablet Take 1 tablet (50 mg total) by mouth every 6 (six) hours as needed. Patient not taking: Reported on 04/05/2019 02/02/19   02/04/19, PA-C  venlafaxine XR (EFFEXOR XR) 37.5 MG 24 hr capsule Take 1 capsule (37.5 mg total) by mouth daily with breakfast. Patient not taking: Reported on 04/05/2019 02/12/19   02/14/19, DO  Past Surgical History Past Surgical History:  Procedure Laterality Date  . CARPAL TUNNEL RELEASE Left 04/09/2016   Procedure: CARPAL TUNNEL RELEASE;  Surgeon: Meredith Pel, MD;  Location: [facility];  Service: Orthopedics;  Laterality: Left;  . HAND SURGERY    . KIDNEY STONE SURGERY     lithortripsy   Family History Family History  Problem Relation Age of Onset  .  Hypertension Other   . Diabetes Father     Social History Social History   Tobacco Use  . Smoking status: Former Research scientist (life sciences)  . Smokeless tobacco: Never Used  Substance Use Topics  . Alcohol use: Yes    Comment: 2-3 times a month   . Drug use: No   Allergies Coconut flavor, Fentanyl, and Penicillins  Review of Systems Review of Systems All other systems are reviewed and are negative for acute change except as noted in the HPI  Physical Exam Vital Signs  I have reviewed the triage vital signs BP 126/90 (BP Location: Left Arm)   Pulse (!) 115   Temp 98.6 F (37 C) (Oral)   Resp 17   Ht 5\' 11"  (1.803 m)   Wt 97.5 kg   SpO2 100%   BMI 29.99 kg/m   Physical Exam Vitals signs reviewed.  Constitutional:      General: He is not in acute distress.    Appearance: He is well-developed. He is ill-appearing. He is not diaphoretic.  HENT:     Head: Normocephalic and atraumatic.     Jaw: No trismus.     Right Ear: External ear normal.     Left Ear: External ear normal.     Nose: Nose normal.  Eyes:     General: No scleral icterus.    Conjunctiva/sclera: Conjunctivae normal.  Neck:     Musculoskeletal: Normal range of motion.     Trachea: Phonation normal.  Cardiovascular:     Rate and Rhythm: Regular rhythm. Tachycardia present.  Pulmonary:     Effort: Pulmonary effort is normal. No respiratory distress.     Breath sounds: No stridor.  Abdominal:     General: There is no distension.     Tenderness: There is no abdominal tenderness.  Musculoskeletal: Normal range of motion.  Neurological:     Mental Status: He is alert and oriented to person, place, and time.  Psychiatric:        Behavior: Behavior normal.     ED Results and Treatments Labs (all labs ordered are listed, but only abnormal results are displayed) Labs Reviewed  COMPREHENSIVE METABOLIC PANEL - Abnormal; Notable for the following components:      Result Value   Glucose, Bld 116 (*)    Total Protein  8.9 (*)    All other components within normal limits  CBC - Abnormal; Notable for the following components:   WBC 11.7 (*)    RBC 6.23 (*)    Hemoglobin 18.0 (*)    HCT 55.2 (*)    All other components within normal limits  LIPASE, BLOOD  EKG  EKG Interpretation  Date/Time:    Ventricular Rate:    PR Interval:    QRS Duration:   QT Interval:    QTC Calculation:   R Axis:     Text Interpretation:        Radiology No results found.  Pertinent labs & imaging results that were available during my care of the patient were reviewed by me and considered in my medical decision making (see chart for details).  Medications Ordered in ED Medications  sodium chloride flush (NS) 0.9 % injection 3 mL (0 mLs Intravenous Hold 04/05/19 0115)  sodium chloride 0.9 % bolus 1,000 mL (1,000 mLs Intravenous New Bag/Given 04/04/19 2348)    Followed by  sodium chloride 0.9 % bolus 1,000 mL (1,000 mLs Intravenous New Bag/Given 04/04/19 2352)    Followed by  0.9 %  sodium chloride infusion (has no administration in time range)  alum & mag hydroxide-simeth (MAALOX/MYLANTA) 200-200-20 MG/5ML suspension 30 mL (has no administration in time range)  ondansetron (ZOFRAN) injection 4 mg (4 mg Intravenous Given 04/04/19 2349)                                                                                                                                    Procedures Procedures  (including critical care time)  Medical Decision Making / ED Course I have reviewed the nursing notes for this encounter and the patient's prior records (if available in EHR or on provided paperwork).   Jacob MorasAdrian L Reaume Jr. was evaluated in Emergency Department on 04/05/2019 for the symptoms described in the history of present illness. He was evaluated in the context of the global COVID-19 pandemic, which  necessitated consideration that the patient might be at risk for infection with the SARS-CoV-2 virus that causes COVID-19. Institutional protocols and algorithms that pertain to the evaluation of patients at risk for COVID-19 are in a state of rapid change based on information released by regulatory bodies including the CDC and federal and state organizations. These policies and algorithms were followed during the patient's care in the ED.  28 y.o. male presents with vomiting, diarrhea for 20 hrs. Possible suspicious food intake. . Decreased oral tolerance. Rest of history as above.  Patient appears well, not in distress, and with no signs of toxicity or dehydration. Abdomen benign. Rest of the exam as above  Labs notable for hemoconcentration of the CBC.  Otherwise no significant electrolyte derangements or renal insufficiency.  No evidence of bili obstruction or pancreatitis.  Low suspicion for serious intra-abdominal inflammatory/infectious process.  Most consistent with viral gastroenteritis vs food poisoning.   No evidence of suggestive of cholinergic toxidrome. Doubt appendicitis, diverticulitis, severe colitis, dysentery.    Able to tolerate oral intake in the ED.  Discussed symptomatic treatment with the patient and they will follow closely with their PCP.       Final Clinical Impression(s) / ED Diagnoses Final diagnoses:  Nausea vomiting and diarrhea    The patient appears reasonably screened and/or stabilized for discharge and I doubt any other medical condition or other [facility] requiring further screening, evaluation, or treatment in the ED at this time prior to discharge.  Disposition: Discharge  Condition: Good  I have discussed the results, Dx and Tx plan with the patient who expressed understanding and agree(s) with the plan. Discharge instructions discussed at great length. The patient was given strict return precautions who verbalized understanding of the instructions. No  further questions at time of discharge.    ED Discharge Orders    None       Follow Up: Primary care provider  Schedule an appointment as soon as possible for a visit  As needed     This chart was dictated using voice recognition software.  Despite best efforts to proofread,  errors can occur which can change the documentation meaning.   Nira Conn, MD 04/05/19 [phone]

## 2019-04-05 LAB — COMPREHENSIVE METABOLIC PANEL
ALT: 29 U/L (ref 0–44)
AST: 20 U/L (ref 15–41)
Albumin: 4.4 g/dL (ref 3.5–5.0)
Alkaline Phosphatase: 86 U/L (ref 38–126)
Anion gap: 10 (ref 5–15)
BUN: 12 mg/dL (ref 6–20)
CO2: 22 mmol/L (ref 22–32)
Calcium: 9.6 mg/dL (ref 8.9–10.3)
Chloride: 106 mmol/L (ref 98–111)
Creatinine, Ser: 1.08 mg/dL (ref 0.61–1.24)
GFR calc Af Amer: >60 mL/min (ref >60)
GFR calc non Af Amer: >60 mL/min (ref >60)
Glucose, Bld: 116 mg/dL — ABNORMAL HIGH (ref 70–99)
Potassium: 4.1 mmol/L (ref 3.5–5.1)
Sodium: 138 mmol/L (ref 135–145)
Total Bilirubin: 1.2 mg/dL (ref 0.3–1.2)
Total Protein: 8.9 g/dL — ABNORMAL HIGH (ref 6.5–8.1)

## 2019-04-05 LAB — LIPASE, BLOOD: Lipase: 21 U/L (ref 11–51)

## 2019-04-05 LAB — CBC
HCT: 55.2 % — ABNORMAL HIGH (ref 39.0–52.0)
Hemoglobin: 18 g/dL — ABNORMAL HIGH (ref 13.0–17.0)
MCH: 28.9 pg (ref 26.0–34.0)
MCHC: 32.6 g/dL (ref 30.0–36.0)
MCV: 88.6 fL (ref 80.0–100.0)
Platelets: 382 10*3/uL (ref 150–400)
RBC: 6.23 MIL/uL — ABNORMAL HIGH (ref 4.22–5.81)
RDW: 14.4 % (ref 11.5–15.5)
WBC: 11.7 10*3/uL — ABNORMAL HIGH (ref 4.0–10.5)
nRBC: 0 % (ref 0.0–0.2)

## 2019-04-05 MED ORDER — ALUM & MAG HYDROXIDE-SIMETH 200-200-20 MG/5ML PO SUSP
30.0000 mL | Freq: Once | ORAL | Status: AC
  Administered 2019-04-05: 02:00:00 30 mL via ORAL
  Filled 2019-04-05: qty 30

## 2019-04-05 NOTE — ED Notes (Signed)
Pt provided with warm blanket. 

## 2019-04-05 NOTE — ED Notes (Signed)
PO Challenge: Pt able to tolerate clear liquids and ice chip. Pt requesting more clear liquids.

## 2019-04-25 ENCOUNTER — Emergency Department (HOSPITAL_COMMUNITY)
Admission: EM | Admit: 2019-04-25 | Discharge: 2019-04-25 | Disposition: A | Discharge status: Home / Self Care | Payer: BC Managed Care – PPO | Attending: Emergency Medicine | Admitting: Emergency Medicine

## 2019-04-25 ENCOUNTER — Encounter (HOSPITAL_COMMUNITY): Payer: Self-pay

## 2019-04-25 ENCOUNTER — Other Ambulatory Visit: Payer: Self-pay

## 2019-04-25 DIAGNOSIS — Z20822 Contact with and (suspected) exposure to covid-19: Secondary | ICD-10-CM

## 2019-04-25 DIAGNOSIS — R05 Cough: Secondary | ICD-10-CM | POA: Diagnosis not present

## 2019-04-25 DIAGNOSIS — Z20828 Contact with and (suspected) exposure to other viral communicable diseases: Secondary | ICD-10-CM | POA: Insufficient documentation

## 2019-04-25 DIAGNOSIS — R0981 Nasal congestion: Secondary | ICD-10-CM | POA: Insufficient documentation

## 2019-04-25 DIAGNOSIS — Z79899 Other long term (current) drug therapy: Secondary | ICD-10-CM | POA: Diagnosis not present

## 2019-04-25 DIAGNOSIS — Z87891 Personal history of nicotine dependence: Secondary | ICD-10-CM | POA: Insufficient documentation

## 2019-04-25 NOTE — ED Triage Notes (Signed)
Pt presents with c/o nasal congestion and cough since yesterday. Pt reports he has a co-worker whose wife takes care of a child that tested positive.

## 2019-04-25 NOTE — ED Notes (Signed)
Pt verbalizes understanding of DC instructions. Pt belongings returned and is ambulatory out of ED.  

## 2019-04-25 NOTE — Discharge Instructions (Addendum)
You were seen in the ED for symptoms concerning for COVID.   I suspect you have a virus.  This could be influenza, COVID, other virus.    We tested your for COVID-19 (coronavirus) infection.  It is also possible you could have other viral upper respiratory infection from another virus.    COVID test results come back in 48-72 hours, sometimes sooner.  Someone will call you to notify you of results.  You can also check MyChart for formal results that will be posted.   Treatment of your illness and symptoms for now will include self-isolation, monitoring of symptoms and supportive care with over-the-counter medicines.    Stay well-hydrated. Rest. You can use over the counter medications to help with symptoms: [phone] mg acetaminophen (tylenol) every 6 hours, around the clock to help with associated fevers, sore throat, headaches, generalized body aches and malaise.  Oxymetazoline (afrin) intranasal spray once daily for no more than 3 days to help with congestion, after 3 days you can switch to another over-the-counter nasal steroid spray such as fluticasone (flonase) Allergy medication (loratadine, cetirizine, etc) and phenylephrine (sudafed) help with nasal congestion, runny nose and postnasal drip.   Dextromethorphan (Delsym) to suppress dry cough. Frequent coughing is likely causing your chest wall pain Guaifenesin (Mucinex) to help expectorate mucus and cough Wash your hands often to prevent spread.  Stay hydrated with plenty of clear fluids Rest   Return to the ED if symptoms are worsening or severe, there is increased work of breathing, chest pain or shortness of breath with exertion or activity, inability to tolerate fluids due to persistent vomiting despite nausea medicines, passing out, light headedness.  If your test results are POSITIVE, the following isolation requirements need to be met to return to work and resume essential activities: At least 10 days since symptom onset  72  hours of absence of fever without antifever medicine (ibuprofen, acetaminophen). A fever is temperature of 100.72F or greater. Improvement of respiratory symptoms  If your test is NEGATIVE, you may return to work and essential activities as long as your symptoms have improved and you do not have a fever for a total of 3 days.  Call your job and notify them that your test result was negative to see if they will allow you to return to work.     Infection Prevention Recommendations for Individuals Confirmed to have, or Being Evaluated for, or have symptoms of 2019 Novel Coronavirus (COVID-19) Infection Who Receive Care at Home  Individuals who are confirmed to have, or are being evaluated for, COVID-19 should follow the prevention steps below until a healthcare provider or local or state health department says they can return to normal activities.  Stay home except to get medical care You should restrict activities outside your home, except for getting medical care. Do not go to work, school, or public areas, and do not use public transportation or taxis.  Call ahead before visiting your doctor Before your medical appointment, call the healthcare provider and tell them that you have, or are being evaluated for, COVID-19 infection. This will help the healthcare providers office take steps to keep other people from getting infected. Ask your healthcare provider to call the local or state health department.  Monitor your symptoms Seek prompt medical attention if your illness is worsening (e.g., difficulty breathing). Before going to your medical appointment, call the healthcare provider and tell them that you have, or are being evaluated for, COVID-19 infection. Ask your healthcare provider  to call the local or state health department.  Wear a facemask You should wear a facemask that covers your nose and mouth when you are in the same room with other people and when you visit a healthcare  provider. People who live with or visit you should also wear a facemask while they are in the same room with you.  Separate yourself from other people in your home As much as possible, you should stay in a different room from other people in your home. Also, you should use a separate bathroom, if available.  Avoid sharing household items You should not share dishes, drinking glasses, cups, eating utensils, towels, bedding, or other items with other people in your home. After using these items, you should wash them thoroughly with soap and water.  Cover your coughs and sneezes Cover your mouth and nose with a tissue when you cough or sneeze, or you can cough or sneeze into your sleeve. Throw used tissues in a lined trash can, and immediately wash your hands with soap and water for at least 20 seconds or use an alcohol-based hand rub.  Wash your [facility] your hands often and thoroughly with soap and water for at least 20 seconds. You can use an alcohol-based hand sanitizer if soap and water are not available and if your hands are not visibly dirty. Avoid touching your eyes, nose, and mouth with unwashed hands.   Prevention Steps for Caregivers and Household Members of Individuals Confirmed to have, or Being Evaluated for, or have symptoms of 2019 Novel Coronavirus (COVID-19) Infection Being Cared for in the Home  If you live with, or provide care at home for, a person confirmed to have, or being evaluated for, COVID-19 infection please follow these guidelines to prevent infection:  Follow healthcare providers instructions Make sure that you understand and can help the patient follow any healthcare provider instructions for all care.  Provide for the patients basic needs You should help the patient with basic needs in the home and provide support for getting groceries, prescriptions, and other personal needs.  Monitor the patients symptoms If they are getting sicker, call his or her  medical provider and tell them that the patient has, or is being evaluated for, COVID-19 infection. This will help the healthcare providers office take steps to keep other people from getting infected. Ask the healthcare provider to call the local or state health department.  Limit the number of people who have contact with the patient If possible, have only one caregiver for the patient. Other household members should stay in another home or place of residence. If this is not possible, they should stay in another room, or be separated from the patient as much as possible. Use a separate bathroom, if available. Restrict visitors who do not have an essential need to be in the home.  Keep older adults, very young children, and other sick people away from the patient Keep older adults, very young children, and those who have compromised immune systems or chronic health conditions away from the patient. This includes people with chronic heart, lung, or kidney conditions, diabetes, and cancer.  Ensure good ventilation Make sure that shared spaces in the home have good air flow, such as from an air conditioner or an opened window, weather permitting.  Wash your hands often Wash your hands often and thoroughly with soap and water for at least 20 seconds. You can use an alcohol based hand sanitizer if soap and water are  not available and if your hands are not visibly dirty. Avoid touching your eyes, nose, and mouth with unwashed hands. Use disposable paper towels to dry your hands. If not available, use dedicated cloth towels and replace them when they become wet.  Wear a facemask and gloves Wear a disposable facemask at all times in the room and gloves when you touch or have contact with the patients blood, body fluids, and/or secretions or excretions, such as sweat, saliva, sputum, nasal mucus, vomit, urine, or feces.  Ensure the mask fits over your nose and mouth tightly, and do not touch it during  use. Throw out disposable facemasks and gloves after using them. Do not reuse. Wash your hands immediately after removing your facemask and gloves. If your personal clothing becomes contaminated, carefully remove clothing and launder. Wash your hands after handling contaminated clothing. Place all used disposable facemasks, gloves, and other waste in a lined container before disposing them with other household waste. Remove gloves and wash your hands immediately after handling these items.  Do not share dishes, glasses, or other household items with the patient Avoid sharing household items. You should not share dishes, drinking glasses, cups, eating utensils, towels, bedding, or other items with a patient who is confirmed to have, or being evaluated for, COVID-19 infection. After the person uses these items, you should wash them thoroughly with soap and water.  Wash laundry thoroughly Immediately remove and wash clothes or bedding that have blood, body fluids, and/or secretions or excretions, such as sweat, saliva, sputum, nasal mucus, vomit, urine, or feces, on them. Wear gloves when handling laundry from the patient. Read and follow directions on labels of laundry or clothing items and detergent. In general, wash and dry with the warmest temperatures recommended on the label.  Clean all areas the individual has used often Clean all touchable surfaces, such as counters, tabletops, doorknobs, bathroom fixtures, toilets, phones, keyboards, tablets, and bedside tables, every day. Also, clean any surfaces that may have blood, body fluids, and/or secretions or excretions on them. Wear gloves when cleaning surfaces the patient has come in contact with. Use a diluted bleach solution (e.g., dilute bleach with 1 part bleach and 10 parts water) or a household disinfectant with a label that says EPA-registered for coronaviruses. To make a bleach solution at home, add 1 tablespoon of bleach to 1 quart (4  cups) of water. For a larger supply, add  cup of bleach to 1 gallon (16 cups) of water. Read labels of cleaning products and follow recommendations provided on product labels. Labels contain instructions for safe and effective use of the cleaning product including precautions you should take when applying the product, such as wearing gloves or eye protection and making sure you have good ventilation during use of the product. Remove gloves and wash hands immediately after cleaning.  Monitor yourself for signs and symptoms of illness Caregivers and household members are considered close contacts, should monitor their health, and will be asked to limit movement outside of the home to the extent possible. Follow the monitoring steps for close contacts listed on the symptom monitoring form.  ? If you have additional questions, contact your local health department or call the epidemiologist on call at [phone] (available 24/7). ? This guidance is subject to change. For the most up-to-date guidance from [facility], please refer to their website: [email or web address]

## 2019-04-25 NOTE — ED Provider Notes (Signed)
[facility]-EMERGENCY DEPT Provider Note   CSN: [id number] Arrival date & time: 04/25/19  1156     History   Chief Complaint Chief Complaint  Patient presents with  . Nasal Congestion  . Cough    HPI Jacob Nixon. is a 28 y.o. male presents to the ER for concern of COVID-19 symptoms.  Onset yesterday.  Reports nasal drainage, stuffy nose, scratchy throat, mild intermittent cough with no sputum.  One of his coworkers wife works with children and 1 of these children tested positive for COVID-19 recently.  Coworker or wife do not have any symptoms but they are also getting tested.  No sick contacts at home.  No travel.  No known cardiac or lung history.  Denies associated fever, chest pain, exertional shortness of breath, vomiting, diarrhea, domino pain, loss of taste, loss of smell, body aches.  Took NyQuil last night with mild improvement.  No modifying factors.    HPI  Past Medical History:  Diagnosis Date  . Bronchitis   . Cellulitis of right hand - RECURRENT   . Kidney stones     Patient Active Problem List   Diagnosis Date Noted  . Labral tear of shoulder 12/15/2018  . Hyponatremia 10/26/2016  . Hypokalemia 10/26/2016  . Elevated glucose level   . Cellulitis of hand 08/04/2016  . Carpal tunnel syndrome on right 05/08/2016  . Abscess of upper arm   . Cellulitis 01/22/2016  . Leukocytosis 01/22/2016  . Neuropathy 01/22/2016  . Nephrolithiasis 01/22/2016  . Cellulitis of right hand   . Cellulitis of forearm, right 07/30/2015    Past Surgical History:  Procedure Laterality Date  . CARPAL TUNNEL RELEASE Left 04/09/2016   Procedure: CARPAL TUNNEL RELEASE;  Surgeon: Cammy Copa, MD;  [facility]: [facility];  Service: Orthopedics;  Laterality: Left;  . HAND SURGERY    . KIDNEY STONE SURGERY     lithortripsy        Home Medications    Prior to Admission medications   Medication Sig Start Date End Date Taking? Authorizing Provider   bismuth subsalicylate (PEPTO BISMOL) 262 MG/15ML suspension Take 30 mLs by mouth every 6 (six) hours as needed for indigestion or diarrhea or loose stools.    [provider]  clindamycin (CLEOCIN) 150 MG capsule Take 2 capsules (300 mg total) by mouth 3 (three) times daily. May dispense as 150mg  capsules Patient not taking: Reported on 04/05/2019 02/02/19   02/04/19, PA-C  meloxicam (MOBIC) 15 MG tablet Take 1 tablet (15 mg total) by mouth daily. Patient not taking: Reported on 04/05/2019 01/13/19   01/15/19, DO  montelukast (SINGULAIR) 10 MG tablet Take 1 tablet (10 mg total) by mouth at bedtime. Patient not taking: Reported on 04/05/2019 12/15/18   12/17/18, DO  nitroGLYCERIN (NITRODUR - DOSED IN MG/24 HR) 0.2 mg/hr patch 1/4 patch daily Patient not taking: Reported on 04/05/2019 12/15/18   12/17/18, DO  nortriptyline (PAMELOR) 25 MG capsule Take 25 mg by mouth at bedtime. 03/25/19   [provider]  ondansetron (ZOFRAN ODT) 4 MG disintegrating tablet Take 1 tablet (4 mg total) by mouth every 8 (eight) hours as needed for nausea or vomiting. 10/11/18   Horton, 10/13/18, MD  predniSONE (DELTASONE) 50 MG tablet Take 1 tablet (50 mg total) by mouth daily. Patient not taking: Reported on 04/05/2019 03/18/19   03/20/19, DO  tamsulosin (FLOMAX) 0.4 MG CAPS capsule Take 1  capsule (0.4 mg total) by mouth daily. Patient not taking: Reported on 04/05/2019 10/11/18   Horton, Barbette Hair, MD  traMADol (ULTRAM) 50 MG tablet Take 1 tablet (50 mg total) by mouth every 6 (six) hours as needed. Patient not taking: Reported on 04/05/2019 02/02/19   Larene Pickett, PA-C  venlafaxine XR (EFFEXOR XR) 37.5 MG 24 hr capsule Take 1 capsule (37.5 mg total) by mouth daily with breakfast. Patient not taking: Reported on 04/05/2019 02/12/19   Lyndal Pulley, DO    Family History Family History  Problem Relation Age of Onset  . Hypertension Other   . Diabetes  Father     Social History Social History   Tobacco Use  . Smoking status: Former Research scientist (life sciences)  . Smokeless tobacco: Never Used  Substance Use Topics  . Alcohol use: Yes    Comment: 2-3 times a month   . Drug use: No     Allergies   Coconut flavor, Fentanyl, and Penicillins   Review of Systems Review of Systems  HENT: Positive for congestion, postnasal drip, rhinorrhea and sore throat.   Respiratory: Positive for cough.   All other systems reviewed and are negative.    Physical Exam Updated Vital Signs BP 130/86   Pulse (!) 104   Temp 98.8 F (37.1 C) (Oral)   Resp 18   SpO2 96%   Physical Exam Vitals signs and nursing note reviewed.  Constitutional:      General: He is not in acute distress.    Appearance: He is well-developed.     Comments: NAD.  HENT:     Head: Normocephalic and atraumatic.     Right Ear: External ear normal.     Left Ear: External ear normal.     Nose: Congestion and rhinorrhea present.     Mouth/Throat:     Pharynx: Posterior oropharyngeal erythema present.     Comments: Mild erythema in oropharynx, tonsils and uvula are normal.  Uvula midline.  No hypertrophy of tonsils.  No exudates, petechiae.  Normal sublingual space and protrusion.  Tolerating secretions. Eyes:     General: No scleral icterus.    Conjunctiva/sclera: Conjunctivae normal.  Neck:     Musculoskeletal: Normal range of motion and neck supple.     Comments: No cervical lymphadenopathy Cardiovascular:     Rate and Rhythm: Normal rate and regular rhythm.     Heart sounds: Normal heart sounds. No murmur.  Pulmonary:     Effort: Pulmonary effort is normal.     Breath sounds: Normal breath sounds. No wheezing.     Comments: Speaking full sentences, no increased work of breathing.  No wheezing no crackles. Musculoskeletal: Normal range of motion.        General: No deformity.  Skin:    General: Skin is warm and dry.     Capillary Refill: Capillary refill takes less than 2  seconds.  Neurological:     Mental Status: He is alert and oriented to person, place, and time.  Psychiatric:        Behavior: Behavior normal.        Thought Content: Thought content normal.        Judgment: Judgment normal.      ED Treatments / Results  Labs (all labs ordered are listed, but only abnormal results are displayed) Labs Reviewed  SARS CORONAVIRUS 2 (TAT 6-24 HRS)    EKG None  Radiology No results found.  Procedures Procedures (including critical care time)  Medications  Ordered in ED Medications - No data to display   Initial Impression / Assessment and Plan / ED Course  I have reviewed the triage vital signs and the nursing notes.  Pertinent labs & imaging results that were available during my care of the patient were reviewed by me and considered in my medical decision making (see chart for details).  Clinical Course as of Apr 25 1611  Sun Apr 25, 2019  1416 Nyquil last night and cough drops    [CG]    Clinical Course User Index [CG] Liberty HandyGibbons, Ladon Vandenberghe J, PA-C   I have reviewed patient's EMR to obtain pertinent PMH to assist in MDM.  Symptoms and exam most suggestive of uncomplicated viral illness.   DDX incluldes viral URI including COVID-19 vs influenza vs other.  Considered strep, bacterial bronchitis or pneumonia unlikely given overall clinical picture. Centor score is low.   Normal WOB. Normal VS.  No tachypnea, hypoxemia. Ambulatory without hypoxia.  Lungs are CTAB.   I do not think that a CXR or emergent work up is indicated at this time given overall clinical well being, normal V.  No signs of consolidation on auscultation and there is no hypoxia, increased WOB or other concerning features to exam. No clinical signs of severe illness, dehydration to warrant labs or IVF. No significant h/o immunocompromise, comorbidities, lung or heart disease to warrant more work up.   ER work up reviewed by me as above, reassuring.   Given reassuring  clinical findings/work up, will discharge with symptomatic treatment. Pt was evaluated in the context of the global COVID-19 pandemic.  We discussed patient's overall low risk profile to develop complications.  Pt aware to follow up on results via Mychart.  Recommended PCP f/u in the next 2-3 days for re-check to ensure to clinical decline, further guidance as needed. Self-isolation instructions at home until pending test discussed. Pt was given home self-isolation instructions and instructions for family members. Educated on signs and symptoms that would warrant return to ED.  Pt comfortable and agreeable with POC.    Final Clinical Impressions(s) / ED Diagnoses   Final diagnoses:  Suspected COVID-19 virus infection    ED Discharge Orders    None       Liberty HandyGibbons, Fleda Pagel J, PA-C 04/25/19 1612    Terrilee FilesButler, Michael C, MD 04/25/19 [phone]

## 2019-04-26 LAB — SARS CORONAVIRUS 2 (TAT 6-24 HRS): SARS Coronavirus 2: NEGATIVE

## 2019-04-28 ENCOUNTER — Emergency Department (HOSPITAL_COMMUNITY): Payer: BC Managed Care – PPO

## 2019-04-28 ENCOUNTER — Other Ambulatory Visit: Payer: Self-pay

## 2019-04-28 ENCOUNTER — Encounter (HOSPITAL_COMMUNITY): Payer: Self-pay

## 2019-04-28 ENCOUNTER — Emergency Department (HOSPITAL_COMMUNITY)
Admission: EM | Admit: 2019-04-28 | Discharge: 2019-04-28 | Disposition: A | Discharge status: Home / Self Care | Payer: BC Managed Care – PPO | Attending: Emergency Medicine | Admitting: Emergency Medicine

## 2019-04-28 DIAGNOSIS — R0789 Other chest pain: Secondary | ICD-10-CM

## 2019-04-28 DIAGNOSIS — R29810 Facial weakness: Secondary | ICD-10-CM | POA: Diagnosis not present

## 2019-04-28 DIAGNOSIS — I639 Cerebral infarction, unspecified: Secondary | ICD-10-CM

## 2019-04-28 DIAGNOSIS — R4702 Dysphasia: Secondary | ICD-10-CM

## 2019-04-28 DIAGNOSIS — R4701 Aphasia: Secondary | ICD-10-CM | POA: Diagnosis not present

## 2019-04-28 DIAGNOSIS — Z20828 Contact with and (suspected) exposure to other viral communicable diseases: Secondary | ICD-10-CM | POA: Insufficient documentation

## 2019-04-28 DIAGNOSIS — R079 Chest pain, unspecified: Secondary | ICD-10-CM | POA: Diagnosis not present

## 2019-04-28 DIAGNOSIS — R0602 Shortness of breath: Secondary | ICD-10-CM | POA: Insufficient documentation

## 2019-04-28 DIAGNOSIS — R0981 Nasal congestion: Secondary | ICD-10-CM | POA: Insufficient documentation

## 2019-04-28 DIAGNOSIS — Z87891 Personal history of nicotine dependence: Secondary | ICD-10-CM | POA: Diagnosis not present

## 2019-04-28 LAB — CBC WITH DIFFERENTIAL/PLATELET
Abs Immature Granulocytes: 0.03 10*3/uL (ref 0.00–0.07)
Basophils Absolute: 0.1 10*3/uL (ref 0.0–0.1)
Basophils Relative: 0 %
Eosinophils Absolute: 0.6 10*3/uL — ABNORMAL HIGH (ref 0.0–0.5)
Eosinophils Relative: 5 %
HCT: 48.5 % (ref 39.0–52.0)
Hemoglobin: 16.5 g/dL (ref 13.0–17.0)
Immature Granulocytes: 0 %
Lymphocytes Relative: 24 %
Lymphs Abs: 2.9 10*3/uL (ref 0.7–4.0)
MCH: 29.6 pg (ref 26.0–34.0)
MCHC: 34 g/dL (ref 30.0–36.0)
MCV: 87.1 fL (ref 80.0–100.0)
Monocytes Absolute: 1 10*3/uL (ref 0.1–1.0)
Monocytes Relative: 8 %
Neutro Abs: 7.4 10*3/uL (ref 1.7–7.7)
Neutrophils Relative %: 63 %
Platelets: 362 10*3/uL (ref 150–400)
RBC: 5.57 MIL/uL (ref 4.22–5.81)
RDW: 13.4 % (ref 11.5–15.5)
WBC: 12 10*3/uL — ABNORMAL HIGH (ref 4.0–10.5)
nRBC: 0 % (ref 0.0–0.2)

## 2019-04-28 LAB — POC SARS CORONAVIRUS 2 AG -  ED: SARS Coronavirus 2 Ag: NEGATIVE

## 2019-04-28 LAB — BASIC METABOLIC PANEL
Anion gap: 11 (ref 5–15)
BUN: 7 mg/dL (ref 6–20)
CO2: 26 mmol/L (ref 22–32)
Calcium: 9.4 mg/dL (ref 8.9–10.3)
Chloride: 101 mmol/L (ref 98–111)
Creatinine, Ser: 0.96 mg/dL (ref 0.61–1.24)
GFR calc Af Amer: >60 mL/min (ref >60)
GFR calc non Af Amer: >60 mL/min (ref >60)
Glucose, Bld: 114 mg/dL — ABNORMAL HIGH (ref 70–99)
Potassium: 3.9 mmol/L (ref 3.5–5.1)
Sodium: 138 mmol/L (ref 135–145)

## 2019-04-28 LAB — URINALYSIS, ROUTINE W REFLEX MICROSCOPIC
Bilirubin Urine: NEGATIVE
Glucose, UA: NEGATIVE mg/dL
Ketones, ur: NEGATIVE mg/dL
Leukocytes,Ua: NEGATIVE
Nitrite: NEGATIVE
Protein, ur: NEGATIVE mg/dL
Specific Gravity, Urine: 1.021 (ref 1.005–1.030)
pH: 6 (ref 5.0–8.0)

## 2019-04-28 LAB — TROPONIN I (HIGH SENSITIVITY)
Troponin I (High Sensitivity): <2 ng/L (ref <18)
Troponin I (High Sensitivity): <2 ng/L (ref <18)

## 2019-04-28 LAB — RAPID URINE DRUG SCREEN, HOSP PERFORMED
Amphetamines: NOT DETECTED
Barbiturates: NOT DETECTED
Benzodiazepines: NOT DETECTED
Cocaine: NOT DETECTED
Opiates: POSITIVE — AB
Tetrahydrocannabinol: NOT DETECTED

## 2019-04-28 LAB — PROTIME-INR
INR: 1 (ref 0.8–1.2)
Prothrombin Time: 13.4 seconds (ref 11.4–15.2)

## 2019-04-28 LAB — APTT: aPTT: 26 seconds (ref 24–36)

## 2019-04-28 LAB — ETHANOL: Alcohol, Ethyl (B): <10 mg/dL (ref <10)

## 2019-04-28 LAB — CBG MONITORING, ED: Glucose-Capillary: 101 mg/dL — ABNORMAL HIGH (ref 70–99)

## 2019-04-28 MED ORDER — MORPHINE SULFATE (PF) 4 MG/ML IV SOLN
4.0000 mg | Freq: Once | INTRAVENOUS | Status: AC
  Administered 2019-04-28: 17:00:00 4 mg via INTRAVENOUS
  Filled 2019-04-28: qty 1

## 2019-04-28 MED ORDER — METOCLOPRAMIDE HCL 5 MG/ML IJ SOLN
10.0000 mg | Freq: Once | INTRAMUSCULAR | Status: AC
  Administered 2019-04-28: 10 mg via INTRAVENOUS
  Filled 2019-04-28: qty 2

## 2019-04-28 MED ORDER — MORPHINE SULFATE (PF) 4 MG/ML IV SOLN
4.0000 mg | Freq: Once | INTRAVENOUS | Status: AC
  Administered 2019-04-28: 4 mg via INTRAVENOUS
  Filled 2019-04-28: qty 1

## 2019-04-28 MED ORDER — METHOCARBAMOL 500 MG PO TABS
500.0000 mg | ORAL_TABLET | Freq: Once | ORAL | Status: AC
  Administered 2019-04-28: 500 mg via ORAL
  Filled 2019-04-28: qty 1

## 2019-04-28 MED ORDER — METHOCARBAMOL 500 MG PO TABS: 500.0000 mg | ORAL_TABLET | Freq: Two times a day (BID) | ORAL | 0 refills | Status: DC

## 2019-04-28 MED ORDER — METHYLPREDNISOLONE SODIUM SUCC 125 MG IJ SOLR
125.0000 mg | Freq: Once | INTRAMUSCULAR | Status: AC
  Administered 2019-04-28: 125 mg via INTRAVENOUS
  Filled 2019-04-28: qty 2

## 2019-04-28 MED ORDER — GUAIFENESIN 100 MG/5ML PO SOLN
5.0000 mL | Freq: Once | ORAL | Status: DC
  Filled 2019-04-28: qty 5

## 2019-04-28 MED ORDER — LORAZEPAM 2 MG/ML IJ SOLN
1.0000 mg | Freq: Once | INTRAMUSCULAR | Status: AC
  Administered 2019-04-28: 1 mg via INTRAVENOUS
  Filled 2019-04-28: qty 1

## 2019-04-28 MED ORDER — KETOROLAC TROMETHAMINE 30 MG/ML IJ SOLN
15.0000 mg | Freq: Once | INTRAMUSCULAR | Status: AC
  Administered 2019-04-28: 15 mg via INTRAVENOUS
  Filled 2019-04-28: qty 1

## 2019-04-28 MED ORDER — ONDANSETRON HCL 4 MG/2ML IJ SOLN
4.0000 mg | Freq: Once | INTRAMUSCULAR | Status: AC
  Administered 2019-04-28: 4 mg via INTRAVENOUS
  Filled 2019-04-28: qty 2

## 2019-04-28 NOTE — Discharge Instructions (Addendum)
Your lab results showed no acute abnormalities.  For future headaches or similar symptoms to today's presentation, please try the following regimen: Antiinflammatory medications: Take 600 mg of ibuprofen every 6 hours or 440 mg (over the counter dose) to 500 mg (prescription dose) of naproxen every 12 hours.  Use this regimen until headache subsides for up to 3 days .  After this time, these medications may be used as needed for pain. Take these medications with food to avoid upset stomach. Choose only one of these medications, do not take them together. Acetaminophen: Should you continue to have additional pain while taking the ibuprofen or naproxen, you may add in acetaminophen (generic for Tylenol) as needed. Your daily total maximum amount of acetaminophen from all sources should be limited to 4000mg /day for persons without liver problems, or 2000mg /day for those with liver problems. Methocarbamol: Methocarbamol (generic for Robaxin) is a muscle relaxer and can help relieve stiff muscles or muscle spasms.  Do not drive or perform other dangerous activities while taking this medication as it can cause drowsiness as well as changes in reaction time and judgement. Hydration: Have a goal of about a half liter of water every couple hours to stay well hydrated.   Sleep: Please be sure to get plenty of sleep with a goal of 8 hours per night. Having a regular bed time and bedtime routine can help with this.  Screens: Reduce the amount of time you are in front of screens.  Take about a 5-10-minute break every hour or every couple hours to give your eyes rest.  Do not use screens in dark rooms.  Glasses with a blue light filter may also help reduce eye fatigue.  Stress: Take steps to reduce stress as much as possible.   Follow up: Follow-up with your primary care provider on this issue.  May also need to follow-up with the neurologist for increased frequency of headaches.   Test Results for COVID-19  pending  You have a test pending for COVID-19.  Results typically return within about 48 hours.  Be sure to check MyChart for updated results.  We recommend isolating yourself until results are received.  Patients who have symptoms consistent with COVID-19 should self isolated for: At least 3 days (72 hours) have passed since recovery, defined as resolution of fever without the use of fever reducing medications and improvement in respiratory symptoms (e.g., cough, shortness of breath), and At least 7 days have passed since symptoms first appeared.  If you have no symptoms, but your test returns positive, recommend isolating for at least 10 days.

## 2019-04-28 NOTE — Consult Note (Signed)
Referring Physician: Dr. Criss Alvine    Chief Complaint: Acute onset of speech deficit  HPI: Jacob Nixon. is an 28 y.o. male presenting with acute onset of halting speech. He was at work when the symptoms started at about 1:30-2:00 PM. Speech symptoms were preceded by central chest tightness radiating to the left shoulder, in conjunction with palpitations and lightheadedness. His boss noticed a facial droop in conjunction with the slurred speech. Coworkers tried to convince the patient to go to the ED, but he refused due to the potential cost of an ambulance. Speech symptoms lasted for about 30 minutes, then resolved. His wife picked him up at 2:15 PM and took him to the ED. He was anxious and tearful on exam by ED staff. He had a head CT after being evaluated in Triage, which was negative. Code Stroke was then called after there was recurrence of the speech deficit.   He has no prior history of stroke or MI. He vapes. He has a history of bronchitis and kidney stones.   He denies any other neurological symptoms on comprehensive ROS.   CT head: No acute findings. Chronic sinus inflammatory change.  LSN: 1:30 PM tPA Given: No: Speech pattern highly atypical for a lesional aphasia, with stroke felt to be significantly less likely than non-physiological stress-related speech deficit. In this context, risk of hemorrhage significantly outweigh potential benefits.   Past Medical History:  Diagnosis Date  . Bronchitis   . Cellulitis of right hand - RECURRENT   . Kidney stones     Past Surgical History:  Procedure Laterality Date  . CARPAL TUNNEL RELEASE Left 04/09/2016   Procedure: CARPAL TUNNEL RELEASE;  Surgeon: Cammy Copa, MD;  Location: [facility] OR;  Service: Orthopedics;  Laterality: Left;  . HAND SURGERY    . KIDNEY STONE SURGERY     lithortripsy    Family History  Problem Relation Age of Onset  . Hypertension Other   . Diabetes Father    Social History:  reports that he has  quit smoking. He has never used smokeless tobacco. He reports current alcohol use. He reports that he does not use drugs.  Allergies:  Allergies  Allergen Reactions  . Coconut Flavor     Scratchy throat  . Fentanyl     Pt reports feeling sick with fentanyl  . Penicillins     DID THE REACTION INVOLVE: Swelling of the face/tongue/throat, SOB, or low BP? Y Sudden or severe rash/hives, skin peeling, or the inside of the mouth or nose? N  Did it require medical treatment? Y When did it last happen?January 2020 If all above answers are "NO", may proceed with cephalosporin use.     Medications:  No current facility-administered medications on file prior to encounter.    Current Outpatient Medications on File Prior to Encounter  Medication Sig Dispense Refill  . bismuth subsalicylate (PEPTO BISMOL) 262 MG/15ML suspension Take 30 mLs by mouth every 6 (six) hours as needed for indigestion or diarrhea or loose stools.    . clindamycin (CLEOCIN) 150 MG capsule Take 2 capsules (300 mg total) by mouth 3 (three) times daily. May dispense as 150mg  capsules (Patient not taking: Reported on 04/05/2019) 60 capsule 0  . meloxicam (MOBIC) 15 MG tablet Take 1 tablet (15 mg total) by mouth daily. (Patient not taking: Reported on 04/05/2019) 30 tablet 0  . montelukast (SINGULAIR) 10 MG tablet Take 1 tablet (10 mg total) by mouth at bedtime. (Patient not taking: Reported on  04/05/2019) 30 tablet 3  . nitroGLYCERIN (NITRODUR - DOSED IN MG/24 HR) 0.2 mg/hr patch 1/4 patch daily (Patient not taking: Reported on 04/05/2019) 30 patch 1  . nortriptyline (PAMELOR) 25 MG capsule Take 25 mg by mouth at bedtime.    . ondansetron (ZOFRAN ODT) 4 MG disintegrating tablet Take 1 tablet (4 mg total) by mouth every 8 (eight) hours as needed for nausea or vomiting. 20 tablet 0  . predniSONE (DELTASONE) 50 MG tablet Take 1 tablet (50 mg total) by mouth daily. (Patient not taking: Reported on 04/05/2019) 5 tablet 0  .  tamsulosin (FLOMAX) 0.4 MG CAPS capsule Take 1 capsule (0.4 mg total) by mouth daily. (Patient not taking: Reported on 04/05/2019) 30 capsule 0  . traMADol (ULTRAM) 50 MG tablet Take 1 tablet (50 mg total) by mouth every 6 (six) hours as needed. (Patient not taking: Reported on 04/05/2019) 15 tablet 0  . venlafaxine XR (EFFEXOR XR) 37.5 MG 24 hr capsule Take 1 capsule (37.5 mg total) by mouth daily with breakfast. (Patient not taking: Reported on 04/05/2019) 30 capsule 0     ROS: As per HPI. Comprehensive ROS otherwise negative.   Physical Examination: Blood pressure 134/89, pulse (!) 116, temperature 99.6 F (37.6 C), temperature source Oral, resp. rate 18, height  (1.803 m), weight 117.9 kg, SpO2 100 %.  HEENT: [place]/AT. Nasal congestion noted.  Lungs: Respirations unlabored. Ext: No edema  Neurologic Examination: Mental Status: Alert and oriented. Anxious affect. Speech is halting, but with normal grammar and syntax. He stutters when apparently attempting to find words; the proportion of stuttering in any given sentence waxes and wanes throughout the exam. No phonemic or semantic paraphasias noted. Patient with labored affect when correctly following a 3-step command, after which he exhales audibly as though tired from the effort. Repeats a phrase correctly, with frequent pauses between words. Naming intact. No neglect noted.  Cranial Nerves: II:  Visual fields intact. PERRL.  III,IV, VI: No ptosis. EOMI with no nystagmus.  V,VII: Smile symmetric, facial temp sensation equal bilaterally VIII: hearing intact to voice IX,X: Palate rises symmetrically XI: Symmetric shoulder shrug XII: midline tongue extension  Motor: Right : Upper extremity   5/5    Left:     Upper extremity   5/5  Lower extremity   5/5     Lower extremity   5/5 Normal tone throughout; no atrophy noted Sensory: Temp and light touch intact throughout, bilaterally. No extinction.  Deep Tendon Reflexes:  2+ bilateral  biceps and brachioradialis.  3+ patellae bilaterally. 2+ achilles bilaterally Plantars: Right: downgoing   Left: downgoing Cerebellar: No ataxia with FNF bilaterally  Gait: Deferred  Results for orders placed or performed during the hospital encounter of 04/28/19 (from the past 48 hour(s))  Basic metabolic panel     Status: Abnormal   Collection Time: 04/28/19  2:55 PM  Result Value Ref Range   Sodium 138 135 - 145 mmol/L   Potassium 3.9 3.5 - 5.1 mmol/L   Chloride 101 98 - 111 mmol/L   CO2 26 22 - 32 mmol/L   Glucose, Bld 114 (H) 70 - 99 mg/dL   BUN 7 6 - 20 mg/dL   Creatinine, Ser 0.98 0.61 - 1.24 mg/dL   Calcium 9.4 8.9 - 11.9 mg/dL   GFR calc non Af Amer >60 >60 mL/min   GFR calc Af Amer >60 >60 mL/min   Anion gap 11 5 - 15    Comment: Performed at [facility]  [facility], [address] [place], [place] [address]  CBC with Differential     Status: Abnormal   Collection Time: 04/28/19  2:55 PM  Result Value Ref Range   WBC 12.0 (H) 4.0 - 10.5 K/uL   RBC 5.57 4.22 - 5.81 MIL/uL   Hemoglobin 16.5 13.0 - 17.0 g/dL   HCT 60.448.5 54.039.0 - 98.152.0 %   MCV 87.1 80.0 - 100.0 fL   MCH 29.6 26.0 - 34.0 pg   MCHC 34.0 30.0 - 36.0 g/dL   RDW 19.113.4 47.811.5 - 29.515.5 %   Platelets 362 150 - 400 K/uL   nRBC 0.0 0.0 - 0.2 %   Neutrophils Relative % 63 %   Neutro Abs 7.4 1.7 - 7.7 K/uL   Lymphocytes Relative 24 %   Lymphs Abs 2.9 0.7 - 4.0 K/uL   Monocytes Relative 8 %   Monocytes Absolute 1.0 0.1 - 1.0 K/uL   Eosinophils Relative 5 %   Eosinophils Absolute 0.6 (H) 0.0 - 0.5 K/uL   Basophils Relative 0 %   Basophils Absolute 0.1 0.0 - 0.1 K/uL   Immature Granulocytes 0 %   Abs Immature Granulocytes 0.03 0.00 - 0.07 K/uL    Comment: Performed at [facility], [address] [place], [place] [address]  Troponin I (High Sensitivity)     Status: None   Collection Time: 04/28/19  2:55 PM  Result Value Ref Range   Troponin I (High Sensitivity) <2 <18 ng/L    Comment: Performed at [facility]  [facility], [address] [place], [place] [address]  Ethanol     Status: None   Collection Time: 04/28/19  4:40 PM  Result Value Ref Range   Alcohol, Ethyl (B) <10 <10 mg/dL    Comment: (NOTE) Lowest detectable limit for serum alcohol is 10 mg/dL. For medical purposes only. Performed at [facility], [address] [place], [place] [address]   Protime-INR     Status: None   Collection Time: 04/28/19  4:47 PM  Result Value Ref Range   Prothrombin Time 13.4 11.4 - 15.2 seconds   INR 1.0 0.8 - 1.2    Comment: (NOTE) INR goal varies based on device and disease states. Performed at [facility], [address] [place], [place] [address]   APTT     Status: None   Collection Time: 04/28/19  4:47 PM  Result Value Ref Range   aPTT 26 24 - 36 seconds    Comment: Performed at [facility], [address] [place], [place] [address]  CBG monitoring, ED     Status: Abnormal   Collection Time: 04/28/19  4:56 PM  Result Value Ref Range   Glucose-Capillary 101 (H) 70 - 99 mg/dL   Dg Chest 2 View  Result Date: 04/28/2019 CLINICAL DATA:  Chest pain with rapid heart rate EXAM: CHEST - 2 VIEW COMPARISON:  Radiograph 07/21/2017 FINDINGS: Streaky basilar opacities likely reflecting atelectasis. No consolidative lung disease. No pneumothorax or effusion. No convincing features of edema. The cardiomediastinal contours are unremarkable. No acute osseous or soft tissue abnormality. IMPRESSION: Streaky basilar opacities likely reflecting atelectasis. No other acute cardiopulmonary abnormality. Electronically Signed   By: Kreg ShropshirePrice  DeHay M.D.   On: 04/28/2019 15:10   Ct Head Wo Contrast  Result Date: 04/28/2019 CLINICAL DATA:  TIA like episode while at work today. Possible slurred speech and right-sided facial drooping with mild aphasia. EXAM: CT HEAD WITHOUT CONTRAST TECHNIQUE:  Contiguous axial images were obtained from the base of the skull through the vertex without intravenous contrast.  COMPARISON:  None. FINDINGS: Brain: Ventricles, cisterns and other CSF spaces are normal. There is no mass, mass effect, shift of midline structures or acute hemorrhage. There is no evidence of acute infarction. Vascular: No hyperdense vessel or unexpected calcification. Skull: Normal. Negative for fracture or focal lesion. Sinuses/Orbits: Orbits are normal. Mucosal membrane thickening over the floor the maxillary sinuses bilaterally as well as mild opacification over the ethmoid air cells. Other: None. IMPRESSION: 1.  No acute findings. 2.  Chronic sinus inflammatory change. Electronically Signed   By: Marin Olp M.D.   On: 04/28/2019 16:08    Assessment: 28 y.o. male presenting with acute onset of halting speech.  1. Speech pattern is not consistent with a typical lesional aphasia. Overall presentation felt most likely to be due to psychogenic etiology. Has had some life and work stressors recently.  2. Stroke Risk Factors - Obesity. Vaping may also be a stroke risk per the literature.   Recommendations: 1. STAT MRI brain with MRA head.  2. Frequent neuro checks.  3. Management of left sided CP as per EDP.  4. Bolus 1 L NS.  5. Further neurology recommendations following MRI.   @Electronically  signed: Dr. Kerney Elbe 04/28/2019, 5:35 PM

## 2019-04-28 NOTE — ED Triage Notes (Signed)
Pt arrives POV w/ wife for eval of TIA like episode while at work. Pt reports he was in his [employer] when his boss noted an episode of slurred speech, R sided facial droop and wordfinding difficulty. Pt crying on arrival and extremely anxious. Pt is neuro intact on exam, denies hx of stroke, MI. Pt does report L sided chest pain as well which is ongoing. No facial droop, weakness noted on arrival. Eval'd by PA Quentin Cornwall in triage.

## 2019-04-28 NOTE — ED Provider Notes (Signed)
[facility] EMERGENCY DEPARTMENT Provider Note   CSN: [id number] Arrival date & time: 04/28/19  1436     History   Chief Complaint Chief Complaint  Patient presents with   Altered Mental Status    HPI Lannie Yusuf. is a 28 y.o. male.     HPI   Boris Engelmann. is a 28 y.o. male, patient with no pertinent past medical history, presenting to the ED with difficulty speaking with last seen normal between 130 p.m. and 2 p.m. Patient states he can think of the words he wants to say, but is having trouble saying them.  He has not had this issue before.  Patient also complains of onset of chest pain this afternoon, sharp, left chest, 7/10, waxing and waning, nonradiating. He has had a cough, shortness of breath, and nasal congestion beginning 3 days ago.  On December 6 he had a negative Covid test.  Denies fever/chills, N/V/D, confusion, falls/trauma, head injury, vision deficits, numbness, weakness, facial droop, difficulty swallowing, dizziness, or any other complaints.   Past Medical History:  Diagnosis Date   Bronchitis    Cellulitis of right hand - RECURRENT    Kidney stones     Patient Active Problem List   Diagnosis Date Noted   Labral tear of shoulder 12/15/2018   Hyponatremia 10/26/2016   Hypokalemia 10/26/2016   Elevated glucose level    Cellulitis of hand 08/04/2016   Carpal tunnel syndrome on right 05/08/2016   Abscess of upper arm    Cellulitis 01/22/2016   Leukocytosis 01/22/2016   Neuropathy 01/22/2016   Nephrolithiasis 01/22/2016   Cellulitis of right hand    Cellulitis of forearm, right 07/30/2015    Past Surgical History:  Procedure Laterality Date   CARPAL TUNNEL RELEASE Left 04/09/2016   Procedure: CARPAL TUNNEL RELEASE;  Surgeon: Cammy Copa, MD;  Location: [facility] OR;  Service: Orthopedics;  Laterality: Left;   HAND SURGERY     KIDNEY STONE SURGERY     lithortripsy        Home Medications      Prior to Admission medications   Medication Sig Start Date End Date Taking? Authorizing Provider  clindamycin (CLEOCIN) 150 MG capsule Take 2 capsules (300 mg total) by mouth 3 (three) times daily. May dispense as  capsules Patient not taking: Reported on 04/05/2019 02/02/19   Garlon Hatchet, PA-C  meloxicam (MOBIC) 15 MG tablet Take 1 tablet (15 mg total) by mouth daily. Patient not taking: Reported on 04/05/2019 01/13/19   Judi Saa, DO  methocarbamol (ROBAXIN) 500 MG tablet Take 1 tablet (500 mg total) by mouth 2 (two) times daily. 04/28/19   Adasyn Mcadams C, PA-C  montelukast (SINGULAIR) 10 MG tablet Take 1 tablet (10 mg total) by mouth at bedtime. Patient not taking: Reported on 04/05/2019 12/15/18   Judi Saa, DO  nitroGLYCERIN (NITRODUR - DOSED IN MG/24 HR) 0.2 mg/hr patch 1/4 patch daily Patient not taking: Reported on 04/05/2019 12/15/18   Judi Saa, DO  ondansetron (ZOFRAN ODT) 4 MG disintegrating tablet Take 1 tablet (4 mg total) by mouth every 8 (eight) hours as needed for nausea or vomiting. Patient not taking: Reported on 04/28/2019 10/11/18   Horton, Mayer Masker, MD  predniSONE (DELTASONE) 50 MG tablet Take 1 tablet (50 mg total) by mouth daily. Patient not taking: Reported on 04/05/2019 03/18/19   Judi Saa, DO  tamsulosin (FLOMAX) 0.4 MG CAPS capsule Take 1 capsule (0.4  mg total) by mouth daily. Patient not taking: Reported on 04/05/2019 10/11/18   Horton, Mayer Masker, MD  traMADol (ULTRAM) 50 MG tablet Take 1 tablet (50 mg total) by mouth every 6 (six) hours as needed. Patient not taking: Reported on 04/05/2019 02/02/19   Garlon Hatchet, PA-C  venlafaxine XR (EFFEXOR XR) 37.5 MG 24 hr capsule Take 1 capsule (37.5 mg total) by mouth daily with breakfast. Patient not taking: Reported on 04/05/2019 02/12/19   Judi Saa, DO    Family History Family History  Problem Relation Age of Onset   Hypertension Other    Diabetes Father     Social  History Social History   Tobacco Use   Smoking status: Former Smoker   Smokeless tobacco: Never Used  Substance Use Topics   Alcohol use: Yes    Comment: 2-3 times a month    Drug use: No     Allergies   Coconut flavor, Fentanyl, and Penicillins   Review of Systems Review of Systems  Constitutional: Negative for diaphoresis and fever.  HENT: Positive for congestion.   Eyes: Negative for visual disturbance.  Respiratory: Positive for cough and shortness of breath.   Cardiovascular: Positive for chest pain and palpitations. Negative for leg swelling.  Gastrointestinal: Negative for abdominal pain, diarrhea, nausea and vomiting.  Musculoskeletal: Negative for neck pain.  Neurological: Positive for speech difficulty. Negative for dizziness, syncope, facial asymmetry, weakness, light-headedness, numbness and headaches.  All other systems reviewed and are negative.    Physical Exam Updated Vital Signs BP 134/89 (BP Location: Right Arm)    Pulse (!) 116    Temp 99.6 F (37.6 C) (Oral)    Resp 18    Ht  (1.803 m)    Wt 117.9 kg    SpO2 100%    BMI 36.26 kg/m   Physical Exam Vitals signs and nursing note reviewed.  Constitutional:      General: He is not in acute distress.    Appearance: He is well-developed. He is not diaphoretic.  HENT:     Head: Normocephalic and atraumatic.     Mouth/Throat:     Mouth: Mucous membranes are moist.     Pharynx: Oropharynx is clear.  Eyes:     Extraocular Movements: Extraocular movements intact.     Conjunctiva/sclera: Conjunctivae normal.     Pupils: Pupils are equal, round, and reactive to light.  Neck:     Musculoskeletal: Neck supple.  Cardiovascular:     Rate and Rhythm: Normal rate and regular rhythm.     Pulses: Normal pulses.          Radial pulses are 2+ on the right side and 2+ on the left side.       Posterior tibial pulses are 2+ on the right side and 2+ on the left side.     Heart sounds: Normal heart sounds.       Comments: Tactile temperature in the extremities appropriate and equal bilaterally. Not tachycardic upon my exam. Pulmonary:     Effort: Pulmonary effort is normal. No respiratory distress.     Breath sounds: Normal breath sounds.  Abdominal:     Palpations: Abdomen is soft.     Tenderness: There is no abdominal tenderness. There is no guarding.  Musculoskeletal:     Right lower leg: No edema.     Left lower leg: No edema.  Lymphadenopathy:     Cervical: No cervical adenopathy.  Skin:    General:  Skin is warm and dry.  Neurological:     Mental Status: He is alert and oriented to person, place, and time.     Comments: Sensation grossly intact to light touch in the extremities. Patient seems to have difficulty expressing himself with speech.  When he is able to speak, the words are correct and they make sense.  He is also able to type out what he wants to say on his phone. He states he is able to understand people speaking with him. Patient handles oral secretions without difficulty. No noted swallowing defects.  Equal grip strength bilaterally. No arm drift. Strength 5/5 in the upper extremities. Strength 5/5 in the lower extremities.  No gait disturbance. Coordination intact.  Cranial nerves III-XII grossly intact.  No noted visual field deficit. No facial droop.   Psychiatric:        Mood and Affect: Affect normal. Mood is anxious.        Speech: Speech normal.        Behavior: Behavior normal.      ED Treatments / Results  Labs (all labs ordered are listed, but only abnormal results are displayed) Labs Reviewed  BASIC METABOLIC PANEL - Abnormal; Notable for the following components:      Result Value   Glucose, Bld 114 (*)    All other components within normal limits  CBC WITH DIFFERENTIAL/PLATELET - Abnormal; Notable for the following components:   WBC 12.0 (*)    Eosinophils Absolute 0.6 (*)    All other components within normal limits  URINALYSIS, ROUTINE W  REFLEX MICROSCOPIC - Abnormal; Notable for the following components:   Hgb urine dipstick SMALL (*)    Bacteria, UA RARE (*)    All other components within normal limits  RAPID URINE DRUG SCREEN, HOSP PERFORMED - Abnormal; Notable for the following components:   Opiates POSITIVE (*)    All other components within normal limits  CBG MONITORING, ED - Abnormal; Notable for the following components:   Glucose-Capillary 101 (*)    All other components within normal limits  SARS CORONAVIRUS 2 (TAT 6-24 HRS)  ETHANOL  PROTIME-INR  APTT  POC SARS CORONAVIRUS 2 AG -  ED  POC SARS CORONAVIRUS 2 AG -  ED  TROPONIN I (HIGH SENSITIVITY)  TROPONIN I (HIGH SENSITIVITY)    EKG EKG Interpretation  Date/Time:  Wednesday April 28 2019 14:44:22 EST Ventricular Rate:  116 PR Interval:  142 QRS Duration: 80 QT Interval:  300 QTC Calculation: 417 R Axis:   7 Text Interpretation: Sinus tachycardia Cannot rule out Anterior infarct , age undetermined Abnormal ECG no significant change since Jan 2020 Confirmed by Pricilla Loveless [id number]) on 04/28/2019 4:21:53 PM     Radiology Dg Chest 2 View  Result Date: 04/28/2019 CLINICAL DATA:  Chest pain with rapid heart rate EXAM: CHEST - 2 VIEW COMPARISON:  Radiograph 07/21/2017 FINDINGS: Streaky basilar opacities likely reflecting atelectasis. No consolidative lung disease. No pneumothorax or effusion. No convincing features of edema. The cardiomediastinal contours are unremarkable. No acute osseous or soft tissue abnormality. IMPRESSION: Streaky basilar opacities likely reflecting atelectasis. No other acute cardiopulmonary abnormality. Electronically Signed   By: Kreg Shropshire M.D.   On: 04/28/2019 15:10   Ct Head Wo Contrast  Result Date: 04/28/2019 CLINICAL DATA:  TIA like episode while at work today. Possible slurred speech and right-sided facial drooping with mild aphasia. EXAM: CT HEAD WITHOUT CONTRAST TECHNIQUE: Contiguous axial images were obtained  from the base of the  skull through the vertex without intravenous contrast. COMPARISON:  None. FINDINGS: Brain: Ventricles, cisterns and other CSF spaces are normal. There is no mass, mass effect, shift of midline structures or acute hemorrhage. There is no evidence of acute infarction. Vascular: No hyperdense vessel or unexpected calcification. Skull: Normal. Negative for fracture or focal lesion. Sinuses/Orbits: Orbits are normal. Mucosal membrane thickening over the floor the maxillary sinuses bilaterally as well as mild opacification over the ethmoid air cells. Other: None. IMPRESSION: 1.  No acute findings. 2.  Chronic sinus inflammatory change. Electronically Signed   By: Elberta Fortis M.D.   On: 04/28/2019 16:08   Mr Angio Head Wo Contrast  Result Date: 04/28/2019 CLINICAL DATA:  Slurred speech and right facial droop EXAM: MRI HEAD WITHOUT CONTRAST MRA HEAD WITHOUT CONTRAST TECHNIQUE: Multiplanar, multiecho pulse sequences of the brain and surrounding structures were obtained without intravenous contrast. Angiographic images of the head were obtained using MRA technique without contrast. COMPARISON:  None. FINDINGS: MRI HEAD FINDINGS BRAIN: There is no acute infarct, acute hemorrhage or extra-axial collection. The white matter signal is normal for the patient's age. The cerebral and cerebellar volume are age-appropriate. There is no hydrocephalus. The midline structures are normal. VASCULAR: The major intracranial arterial and venous sinus flow voids are normal. Susceptibility-sensitive sequences show no chronic microhemorrhage or superficial siderosis. SKULL AND UPPER CERVICAL SPINE: Calvarial bone marrow signal is normal. There is no skull base mass. The visualized upper cervical spine and soft tissues are normal. SINUSES/ORBITS: There are no fluid levels or advanced mucosal thickening. The mastoid air cells and middle ear cavities are free of fluid. The orbits are normal. MRA HEAD FINDINGS POSTERIOR  CIRCULATION: --Vertebral arteries: Normal V4 segments. --Posterior inferior cerebellar arteries (PICA): Patent origins from the vertebral arteries. --Anterior inferior cerebellar arteries (AICA): Patent origins from the basilar artery. --Basilar artery: Normal. --Superior cerebellar arteries: Normal. --Posterior cerebral arteries: Normal. There are bilateral posterior communicating arteries (p-comm) that partially supply the PCAs. ANTERIOR CIRCULATION: --Intracranial internal carotid arteries: Normal. --Anterior cerebral arteries (ACA): Normal. Both A1 segments are present. Patent anterior communicating artery (a-comm). --Middle cerebral arteries (MCA): Normal. IMPRESSION: Normal MRI and MRA of the brain. Electronically Signed   By: Deatra Robinson M.D.   On: 04/28/2019 20:11   Mr Brain Wo Contrast  Result Date: 04/28/2019 CLINICAL DATA:  Slurred speech and right facial droop EXAM: MRI HEAD WITHOUT CONTRAST MRA HEAD WITHOUT CONTRAST TECHNIQUE: Multiplanar, multiecho pulse sequences of the brain and surrounding structures were obtained without intravenous contrast. Angiographic images of the head were obtained using MRA technique without contrast. COMPARISON:  None. FINDINGS: MRI HEAD FINDINGS BRAIN: There is no acute infarct, acute hemorrhage or extra-axial collection. The white matter signal is normal for the patient's age. The cerebral and cerebellar volume are age-appropriate. There is no hydrocephalus. The midline structures are normal. VASCULAR: The major intracranial arterial and venous sinus flow voids are normal. Susceptibility-sensitive sequences show no chronic microhemorrhage or superficial siderosis. SKULL AND UPPER CERVICAL SPINE: Calvarial bone marrow signal is normal. There is no skull base mass. The visualized upper cervical spine and soft tissues are normal. SINUSES/ORBITS: There are no fluid levels or advanced mucosal thickening. The mastoid air cells and middle ear cavities are free of fluid.  The orbits are normal. MRA HEAD FINDINGS POSTERIOR CIRCULATION: --Vertebral arteries: Normal V4 segments. --Posterior inferior cerebellar arteries (PICA): Patent origins from the vertebral arteries. --Anterior inferior cerebellar arteries (AICA): Patent origins from the basilar artery. --Basilar artery: Normal. --Superior cerebellar arteries:  Normal. --Posterior cerebral arteries: Normal. There are bilateral posterior communicating arteries (p-comm) that partially supply the PCAs. ANTERIOR CIRCULATION: --Intracranial internal carotid arteries: Normal. --Anterior cerebral arteries (ACA): Normal. Both A1 segments are present. Patent anterior communicating artery (a-comm). --Middle cerebral arteries (MCA): Normal. IMPRESSION: Normal MRI and MRA of the brain. Electronically Signed   By: Ulyses Jarred M.D.   On: 04/28/2019 20:11    Procedures Procedures (including critical care time)  Medications Ordered in ED Medications  guaiFENesin (ROBITUSSIN) 100 MG/5ML solution 100 mg (100 mg Oral Refused 04/28/19 2126)  morphine 4 MG/ML injection 4 mg (4 mg Intravenous Given 04/28/19 1724)  ondansetron (ZOFRAN) injection 4 mg (4 mg Intravenous Given 04/28/19 1725)  morphine 4 MG/ML injection 4 mg (4 mg Intravenous Given 04/28/19 1844)  LORazepam (ATIVAN) injection 1 mg (1 mg Intravenous Given 04/28/19 1903)  ketorolac (TORADOL) 30 MG/ML injection 15 mg (15 mg Intravenous Given 04/28/19 2126)  methylPREDNISolone sodium succinate (SOLU-MEDROL) 125 mg/2 mL injection 125 mg (125 mg Intravenous Given 04/28/19 2222)  metoCLOPramide (REGLAN) injection 10 mg (10 mg Intravenous Given 04/28/19 2222)  methocarbamol (ROBAXIN) tablet 500 mg (500 mg Oral Given 04/28/19 2222)     Initial Impression / Assessment and Plan / ED Course  I have reviewed the triage vital signs and the nursing notes.  Pertinent labs & imaging results that were available during my care of the patient were reviewed by me and considered in my medical  decision making (see chart for details).  Clinical Course as of Apr 28 2311  Wed Apr 28, 2019  2123 Talked with Dr. Leonel Ramsay, neurologist. We discussed the patient's symptoms, imaging results, and previous evaluation by Dr. Cheral Marker. Based on the patient's symptoms and clear imaging results, Dr. Leonel Ramsay states there is no reason to admit the patient, nor does he need neurology follow-up. He states we may try a migraine cocktail to see if this alleviates patient's symptoms.   [SJ]  2247 Patient was reevaluated after migraine cocktail.  He is seemingly effortlessly speaking plainly.  He states all of his symptoms have resolved.   [SJ]    Clinical Course User Index [SJ] [place] C, PA-C        Patient presents complaining of difficulty speaking.  This was an acute change from his baseline and one that was not noted when another provider evaluated the patient in triage.  Based on this perceived deficit, a code stroke was activated.  No other deficits were noted. At different points in the patient's ED course he was noted to be anxious and tearful.  During multiple reassessments of the patient, a pattern seemed to emerge.  It was noted that his speech and his complaint of chest discomfort would worsen when he became more anxious and upset. No acute abnormalities on head CT, MRI, or MRI. His symptoms resolved completely after administration of migraine cocktail.  Patient was also complaining of chest pain.  Low suspicion for ACS. No high risk/suspicious features; no exertional chest pain, vomiting, diaphoresis, or radiation. HEART score is 1, indicating low risk for a cardiac event.  EKG without evidence of acute ischemia or pathologic/symptomatic arrhythmia. As he was not tachycardic on my initial exam or for any subsequent exam, Wells criteria score is 0, indicating low risk for PE.  This also means he was PERC negative. Dissection was considered, but thought less likely base on:  History and description of the pain are not suggestive, patient is not ill-appearing, lack of risk factors, equal bilateral  pulses, lack of neurologic deficits, no widened mediastinum on chest x-ray.    Cornelius MorasAdrian L Rubens Jr. was evaluated in Emergency Department on 04/28/2019 for the symptoms described in the history of present illness. He was evaluated in the context of the global COVID-19 pandemic, which necessitated consideration that the patient might be at risk for infection with the SARS-CoV-2 virus that causes COVID-19. Institutional protocols and algorithms that pertain to the evaluation of patients at risk for COVID-19 are in a state of rapid change based on information released by regulatory bodies including the CDC and federal and state organizations. These policies and algorithms were followed during the patient's care in the ED.  Final Clinical Impressions(s) / ED Diagnoses   Final diagnoses:  Atypical chest pain    ED Discharge Orders         Ordered    methocarbamol (ROBAXIN) 500 MG tablet  2 times daily     04/28/19 2250           Concepcion LivingJoy, Tally Mckinnon C, PA-C 04/28/19 2321    Pricilla LovelessGoldston, Scott, MD 05/01/19 [phone]

## 2019-04-28 NOTE — ED Provider Notes (Signed)
MSE was initiated and I personally evaluated the patient and placed orders (if any) at  2:48 PM on April 28, 2019.  The patient appears stable so that the remainder of the MSE may be completed by another provider.  Pt sitting at work when he had onset of central chest tightness radiating towards his left shoulder with assoc palpitations like his heart was racing. His boss noted a facial droop and slurred speech that lasted about 30 minutes in duration. Unknown time of onset. Symptoms resolved prior to arrival. Pt states his chest still feels tight, however the palpitations have resolved. Denies cardiac hx. He is anxious and tearful on exam. Pt sleep is clear.  No focal neuro deficits. Heart slightly tachycardic, lungs CTAB.    , [place] N, PA-C 04/28/19 [place], Ankit, MD 04/30/19 1939

## 2019-04-29 LAB — SARS CORONAVIRUS 2 (TAT 6-24 HRS): SARS Coronavirus 2: NEGATIVE

## 2019-05-17 DIAGNOSIS — M546 Pain in thoracic spine: Secondary | ICD-10-CM | POA: Diagnosis not present

## 2019-05-17 DIAGNOSIS — M542 Cervicalgia: Secondary | ICD-10-CM | POA: Diagnosis not present

## 2019-05-18 DIAGNOSIS — Z6837 Body mass index (BMI) 37.0-37.9, adult: Secondary | ICD-10-CM | POA: Diagnosis not present

## 2019-05-18 DIAGNOSIS — G950 Syringomyelia and syringobulbia: Secondary | ICD-10-CM | POA: Diagnosis not present

## 2019-06-01 ENCOUNTER — Telehealth: Payer: Self-pay

## 2019-06-01 NOTE — Telephone Encounter (Signed)
Patient called stating that his left shoulder has been hurting and was an issue at his last visit, but forgot to talk about the shoulder. Wanted to know if he could get a call back to discuss. Patient is scheduled for the 25th of this month, but would like to se if there is anything that could help till his appointment

## 2019-06-01 NOTE — Telephone Encounter (Signed)
Patient schedule tomorrow at 9:00am.

## 2019-06-02 ENCOUNTER — Other Ambulatory Visit: Payer: Self-pay

## 2019-06-02 ENCOUNTER — Encounter: Payer: Self-pay | Admitting: Family Medicine

## 2019-06-02 ENCOUNTER — Ambulatory Visit (INDEPENDENT_AMBULATORY_CARE_PROVIDER_SITE_OTHER): Payer: BC Managed Care – PPO | Admitting: Family Medicine

## 2019-06-02 VITALS — BP 120/92 | HR 82 | Ht 71.0 in | Wt 270.0 lb

## 2019-06-02 DIAGNOSIS — M2550 Pain in unspecified joint: Secondary | ICD-10-CM

## 2019-06-02 DIAGNOSIS — G5601 Carpal tunnel syndrome, right upper limb: Secondary | ICD-10-CM

## 2019-06-02 DIAGNOSIS — S43432D Superior glenoid labrum lesion of left shoulder, subsequent encounter: Secondary | ICD-10-CM | POA: Diagnosis not present

## 2019-06-02 MED ORDER — KETOROLAC TROMETHAMINE 60 MG/2ML IM SOLN
60.0000 mg | Freq: Once | INTRAMUSCULAR | Status: AC
  Administered 2019-06-02: 60 mg via INTRAMUSCULAR

## 2019-06-02 MED ORDER — METHYLPREDNISOLONE ACETATE 80 MG/ML IJ SUSP
80.0000 mg | Freq: Once | INTRAMUSCULAR | Status: AC
  Administered 2019-06-02: 80 mg via INTRAMUSCULAR

## 2019-06-02 MED ORDER — TRAMADOL HCL 50 MG PO TABS: 50.0000 mg | ORAL_TABLET | Freq: Four times a day (QID) | ORAL | 0 refills | Status: DC | PRN

## 2019-06-02 MED ORDER — TRAZODONE HCL 50 MG PO TABS: 25.0000 mg | ORAL_TABLET | Freq: Every evening | ORAL | 3 refills | Status: DC | PRN

## 2019-06-02 MED ORDER — PREDNISONE 50 MG PO TABS: 50.0000 mg | ORAL_TABLET | Freq: Every day | ORAL | 0 refills | Status: DC

## 2019-06-02 MED ORDER — ACETAZOLAMIDE 125 MG PO TABS: 125.0000 mg | ORAL_TABLET | Freq: Two times a day (BID) | ORAL | 1 refills | Status: DC

## 2019-06-02 NOTE — Assessment & Plan Note (Signed)
Referred to orthopedic surgery to discuss surgical management with patient failing all other conservative therapy.  Secondary to the severe pain in multiple complaints patient was giving different medications as well as injections today

## 2019-06-02 NOTE — Progress Notes (Signed)
[name] [address] [place] [address] Phone: [phone] Subjective:   Jacob Nixon, am serving as a scribe for Dr. Antoine Primas. This visit occurred during the SARS-CoV-2 public health emergency.  Safety protocols were in place, including screening questions prior to the visit, additional usage of staff PPE, and extensive cleaning of exam room while observing appropriate contact time as indicated for disinfecting solutions.   I'm seeing this patient by the request  of:    CC: Left shoulder follow-up, right wrist follow-up  [id number]   02/12/2019 No improvement at this time.  Patient does not want to consider PRP.  It is a small fraying of the posterior labral but at this moment and secondary to patient's livelihood would like referral to orthopedic surgery to discuss surgical intervention  Patient did not respond well to the carpal tunnel, patient also had another flare of the swelling and inflammation of the wrist.  I would like patient to go and have a nerve conduction test to further evaluate for the possibility of reflux and pathetic dystrophy.  Patient is in agreement with this plan.  Started on small dose of Effexor in the meantime.  We will follow-up after the study.  Update 06/02/2019 Jacob Nixon. is a 29 y.o. male coming in with complaint of wrist pain and shoulder pain. Was seen by Dr. Janee Morn who then sent him to [facility]. Patient was told that there was nothing that could be done for him. Patient would like to try carpal tunnel surgery again.  Patient has not returned to see Dr. Janee Morn at the moment Patient was told by neurosurgery that he did have a syrinx I do not have these imaging available to me.  Left shoulder has gotten worse in past 2 weeks. Only able to sleep for 2 hours each night. Noticed an increase in his pain after moving.  Patient has not seen the surgeon at the moment.  Patient does have an MRI  showing the patient did have a labral tear.  He did not respond well to the injection.  Patient would like to have surgical intervention and would not like to do any more conservative therapy.  We have attempted all conservative therapy including formal physical therapy at this point     Past Medical History:  Diagnosis Date  . Bronchitis   . Cellulitis of right hand - RECURRENT   . Kidney stones    Past Surgical History:  Procedure Laterality Date  . CARPAL TUNNEL RELEASE Left 04/09/2016   Procedure: CARPAL TUNNEL RELEASE;  Surgeon: Cammy Copa, MD;  Location: [facility] OR;  Service: Orthopedics;  Laterality: Left;  . HAND SURGERY    . KIDNEY STONE SURGERY     lithortripsy   Social History   Socioeconomic History  . Marital status: Married    Spouse name: Not on file  . Number of children: Not on file  . Years of education: Not on file  . Highest education level: Not on file  Occupational History  . Not on file  Tobacco Use  . Smoking status: Former Games developer  . Smokeless tobacco: Never Used  Substance and Sexual Activity  . Alcohol use: Yes    Comment: 2-3 times a month   . Drug use: No  . Sexual activity: Not on file  Other Topics Concern  . Not on file  Social History Narrative  . Not on file   Social Determinants of Health  Financial Resource Strain:   . Difficulty of Paying Living Expenses: Not on file  Food Insecurity:   . Worried About Programme researcher, broadcasting/film/video in the Last Year: Not on file  . Ran Out of Food in the Last Year: Not on file  Transportation Needs:   . Lack of Transportation (Medical): Not on file  . Lack of Transportation (Non-Medical): Not on file  Physical Activity:   . Days of Exercise per Week: Not on file  . Minutes of Exercise per Session: Not on file  Stress:   . Feeling of Stress : Not on file  Social Connections:   . Frequency of Communication with Friends and Family: Not on file  . Frequency of Social Gatherings with Friends and  Family: Not on file  . Attends Religious Services: Not on file  . Active Member of Clubs or Organizations: Not on file  . Attends Banker Meetings: Not on file  . Marital Status: Not on file   Allergies  Allergen Reactions  . Coconut Flavor     Scratchy throat  . Fentanyl     Pt reports feeling sick with fentanyl  . Penicillins     DID THE REACTION INVOLVE: Swelling of the face/tongue/throat, SOB, or low BP? Y Sudden or severe rash/hives, skin peeling, or the inside of the mouth or nose? N  Did it require medical treatment? Y When did it last happen?January 2020 If all above answers are "NO", may proceed with cephalosporin use.    Family History  Problem Relation Age of Onset  . Hypertension Other   . Diabetes Father     Current Outpatient Medications (Endocrine & Metabolic):  .  predniSONE (DELTASONE) 50 MG tablet, Take 1 tablet (50 mg total) by mouth daily.  Current Outpatient Medications (Cardiovascular):  .  nitroGLYCERIN (NITRODUR - DOSED IN MG/24 HR) 0.2 mg/hr patch, 1/4 patch daily .  acetaZOLAMIDE (DIAMOX) 125 MG tablet, Take 1 tablet (125 mg total) by mouth 2 (two) times daily.  Current Outpatient Medications (Respiratory):  .  montelukast (SINGULAIR) 10 MG tablet, Take 1 tablet (10 mg total) by mouth at bedtime.  Current Outpatient Medications (Analgesics):  .  meloxicam (MOBIC) 15 MG tablet, Take 1 tablet (15 mg total) by mouth daily. .  traMADol (ULTRAM) 50 MG tablet, Take 1 tablet (50 mg total) by mouth every 6 (six) hours as needed.   Current Outpatient Medications (Other):  .  clindamycin (CLEOCIN) 150 MG capsule, Take 2 capsules (300 mg total) by mouth 3 (three) times daily. May dispense as 150mg  capsules .  methocarbamol (ROBAXIN) 500 MG tablet, Take 1 tablet (500 mg total) by mouth 2 (two) times daily. .  ondansetron (ZOFRAN ODT) 4 MG disintegrating tablet, Take 1 tablet (4 mg total) by mouth every 8 (eight) hours as needed for nausea or  vomiting. .  tamsulosin (FLOMAX) 0.4 MG CAPS capsule, Take 1 capsule (0.4 mg total) by mouth daily.  venlafaxine XR (EFFEXOR XR) 37.5 MG 24 hr capsule, Take 1 capsule (37.5 mg total) by mouth daily with breakfast. .  traZODone (DESYREL) 50 MG tablet, Take 0.5-1 tablets (25-50 mg total) by mouth at bedtime as needed for sleep.    Past medical history, social, surgical and family history all reviewed in electronic medical record.  No pertanent information unless stated regarding to the chief complaint.   Review of Systems:  No headache, visual changes, nausea, vomiting, diarrhea, constipation, dizziness, abdominal pain, skin rash, fevers, chills, night sweats,  weight loss, swollen lymph nodes,  chest pain, shortness of breath, mood changes.  Positive muscle aches, body aches  Objective  Blood pressure (!) 120/92, pulse 82, height 5\' 11"  (1.803 m), weight 270 lb (122.5 kg), SpO2 98 %.    General: No apparent distress alert and oriented x3 mood and affect normal, dressed appropriately.  HEENT: Pupils equal, extraocular movements intact  Respiratory: Patient's speak in full sentences and does not appear short of breath  Cardiovascular: No lower extremity edema, non tender, no erythema  Skin: Warm dry intact with no signs of infection or rash on extremities or on axial skeleton.  Abdomen: Soft nontender  Neuro: Cranial nerves II through XII are intact, neurovascularly intact in all extremities with 2+ DTRs and 2+ pulses.  Lymph: No lymphadenopathy of posterior or anterior cervical chain or axillae bilaterally.  Gait normal with good balance and coordination.  MSK:  tender with full range of motion and good stability and symmetric strength and tone of , elbows, wrist, hip, knee and ankles bilaterally.  Patient's right wrist exam does have severe tenderness to palpation.  Positive Tinel's.  Grip strength is 4 out of 5 compared to the contralateral side.  No significant thenar eminence wasting  noted  Left shoulder exam does have positive O'Brien's.  Mild weakness with 4 out of 5 strength of the rotator cuff noted.  Patient continues to have discomfort neurovascularly intact distally.  Neck exam does have some worsening pain with extension of the neck.  Negative Spurling's though noted.  Does have loss of lordosis.  Lacks last 5 degrees of sidebending bilaterally as well.   Impression and Recommendations:     This case required medical decision making of moderate complexity. The above documentation has been reviewed and is accurate and complete Lyndal Pulley, DO       Note: This dictation was prepared with Dragon dictation along with smaller phrase technology. Any transcriptional errors that result from this process are unintentional.

## 2019-06-02 NOTE — Patient Instructions (Signed)
Good to see you  We will get you in with chandler I will send note to [facility] and see  2 injections today  trazadone at night Tramadol for breakthrough  Diamox 2 times a day for possible syrinx Drop off MRI  prednsione daily for 5 days  I am here if you have questions

## 2019-06-02 NOTE — Assessment & Plan Note (Signed)
Encourage patient to follow-up with a hand surgeon.  Patient at this point does have mild to moderate carpal tunnel on nerve conduction test but has failed all other conservative therapy.  Patient feels the only thing that will give him benefit at this moment is surgical intervention

## 2019-06-11 DIAGNOSIS — S43432A Superior glenoid labrum lesion of left shoulder, initial encounter: Secondary | ICD-10-CM | POA: Diagnosis not present

## 2019-06-14 ENCOUNTER — Ambulatory Visit: Payer: BC Managed Care – PPO | Admitting: Family Medicine

## 2019-08-13 ENCOUNTER — Emergency Department (HOSPITAL_COMMUNITY): Payer: BC Managed Care – PPO

## 2019-08-13 ENCOUNTER — Emergency Department (HOSPITAL_COMMUNITY)
Admission: EM | Admit: 2019-08-13 | Discharge: 2019-08-13 | Disposition: A | Discharge status: Home / Self Care | Payer: BC Managed Care – PPO | Attending: Emergency Medicine | Admitting: Emergency Medicine

## 2019-08-13 ENCOUNTER — Encounter (HOSPITAL_COMMUNITY): Payer: Self-pay

## 2019-08-13 ENCOUNTER — Other Ambulatory Visit: Payer: Self-pay

## 2019-08-13 DIAGNOSIS — R200 Anesthesia of skin: Secondary | ICD-10-CM | POA: Diagnosis not present

## 2019-08-13 DIAGNOSIS — Z79899 Other long term (current) drug therapy: Secondary | ICD-10-CM | POA: Insufficient documentation

## 2019-08-13 DIAGNOSIS — Z87891 Personal history of nicotine dependence: Secondary | ICD-10-CM | POA: Diagnosis not present

## 2019-08-13 DIAGNOSIS — M25512 Pain in left shoulder: Secondary | ICD-10-CM | POA: Insufficient documentation

## 2019-08-13 DIAGNOSIS — M542 Cervicalgia: Secondary | ICD-10-CM | POA: Insufficient documentation

## 2019-08-13 DIAGNOSIS — R202 Paresthesia of skin: Secondary | ICD-10-CM | POA: Diagnosis not present

## 2019-08-13 MED ORDER — CYCLOBENZAPRINE HCL 10 MG PO TABS: 10.0000 mg | ORAL_TABLET | Freq: Two times a day (BID) | ORAL | 0 refills | Status: AC | PRN

## 2019-08-13 NOTE — ED Provider Notes (Signed)
[place] DEPT Provider Note   CSN: [id number] Arrival date & time: 08/13/19  1817     History Chief Complaint  Patient presents with  . Shoulder Pain  . Neck Pain    Jacob Nixon. is a 29 y.o. male.  29 y.o male with a PMH of LEFT labral tear presents to the ED with a chief complaint of left shoulder pain x today. Patient reports a prior history of a left labral tear a couple of years ago which he did follow up with ortho but did not have intervention. He was being managed by Dr. Tamala Julian his PCP.  He reports being at work when he suddenly lifted a heavy tire, then felt a popping sensation to his left shoulder which exacerbated his pain.  He also reports some left-sided neck pain which is worse with turning of his head.  He reports he has had this pain for 2 weeks, has not taken any medication for improvement in his symptoms.  According to patient he was previously treated in the past with steroids which did not help.  He denies any fever, shortness of breath, chest pain.  The history is provided by the patient and medical records.  Shoulder Pain Location:  Shoulder Shoulder location:  L shoulder Injury: yes   Associated symptoms: neck pain   Associated symptoms: no fever   Neck Pain Associated symptoms: no chest pain and no fever        Past Medical History:  Diagnosis Date  . Bronchitis   . Cellulitis of right hand - RECURRENT   . Kidney stones     Patient Active Problem List   Diagnosis Date Noted  . Labral tear of shoulder 12/15/2018  . Hyponatremia 10/26/2016  . Hypokalemia 10/26/2016  . Elevated glucose level   . Cellulitis of hand 08/04/2016  . Carpal tunnel syndrome on right 05/08/2016  . Abscess of upper arm   . Cellulitis 01/22/2016  . Leukocytosis 01/22/2016  . Neuropathy 01/22/2016  . Nephrolithiasis 01/22/2016  . Cellulitis of right hand   . Cellulitis of forearm, right 07/30/2015    Past Surgical History:   Procedure Laterality Date  . CARPAL TUNNEL RELEASE Left 04/09/2016   Procedure: CARPAL TUNNEL RELEASE;  Surgeon: Meredith Pel, MD;  Location: [facility];  Service: Orthopedics;  Laterality: Left;  . HAND SURGERY    . KIDNEY STONE SURGERY     lithortripsy       Family History  Problem Relation Age of Onset  . Hypertension Other   . Diabetes Father     Social History   Tobacco Use  . Smoking status: Former Research scientist (life sciences)  . Smokeless tobacco: Never Used  Substance Use Topics  . Alcohol use: Yes    Comment: 2-3 times a month   . Drug use: No    Home Medications Prior to Admission medications   Medication Sig Start Date End Date Taking? Authorizing Provider  acetaZOLAMIDE (DIAMOX) 125 MG tablet Take 1 tablet (125 mg total) by mouth 2 (two) times daily. 06/02/19   Lyndal Pulley, DO  clindamycin (CLEOCIN) 150 MG capsule Take 2 capsules (300 mg total) by mouth 3 (three) times daily. May dispense as 150mg  capsules 02/02/19   Larene Pickett, PA-C  cyclobenzaprine (FLEXERIL) 10 MG tablet Take 1 tablet (10 mg total) by mouth 2 (two) times daily as needed for up to 7 days for muscle spasms. 08/13/19 08/20/19  Janeece Fitting, PA-C  meloxicam (MOBIC) 15  MG tablet Take 1 tablet (15 mg total) by mouth daily. 01/13/19   Judi Saa, DO  methocarbamol (ROBAXIN) 500 MG tablet Take 1 tablet (500 mg total) by mouth 2 (two) times daily. 04/28/19   Joy, Shawn C, PA-C  montelukast (SINGULAIR) 10 MG tablet Take 1 tablet (10 mg total) by mouth at bedtime. 12/15/18   Judi Saa, DO  nitroGLYCERIN (NITRODUR - DOSED IN MG/24 HR) 0.2 mg/hr patch 1/4 patch daily 12/15/18   Judi Saa, DO  ondansetron (ZOFRAN ODT) 4 MG disintegrating tablet Take 1 tablet (4 mg total) by mouth every 8 (eight) hours as needed for nausea or vomiting. 10/11/18   Horton, Mayer Masker, MD  predniSONE (DELTASONE) 50 MG tablet Take 1 tablet (50 mg total) by mouth daily. 06/02/19   Judi Saa, DO  tamsulosin (FLOMAX) 0.4 MG CAPS  capsule Take 1 capsule (0.4 mg total) by mouth daily. 10/11/18   Horton, Mayer Masker, MD  traMADol (ULTRAM) 50 MG tablet Take 1 tablet (50 mg total) by mouth every 6 (six) hours as needed. 06/02/19   Judi Saa, DO  traZODone (DESYREL) 50 MG tablet Take 0.5-1 tablets (25-50 mg total) by mouth at bedtime as needed for sleep. 06/02/19   Judi Saa, DO  venlafaxine XR (EFFEXOR XR) 37.5 MG 24 hr capsule Take 1 capsule (37.5 mg total) by mouth daily with breakfast. 02/12/19   Judi Saa, DO    Allergies    Coconut flavor, Fentanyl, and Penicillins  Review of Systems   Review of Systems  Constitutional: Negative for fever.  Respiratory: Negative for shortness of breath.   Cardiovascular: Negative for chest pain.  Gastrointestinal: Negative for abdominal pain.  Musculoskeletal: Positive for arthralgias and neck pain.    Physical Exam Updated Vital Signs BP 140/87 (BP Location: Right Arm)   Pulse 92   Temp 99.9 F (37.7 C) (Oral)   Resp 16   Ht 5\' 11"  (1.803 m)   Wt 117.9 kg   SpO2 100%   BMI 36.26 kg/m   Physical Exam Vitals and nursing note reviewed.  Constitutional:      Appearance: Normal appearance.  HENT:     Head: Normocephalic and atraumatic.     Mouth/Throat:     Mouth: Mucous membranes are moist.  Eyes:     Pupils: Pupils are equal, round, and reactive to light.  Neck:      Comments: Full range of motion with pain.  No neck rigidity.  No midline tenderness. Cardiovascular:     Rate and Rhythm: Normal rate.  Pulmonary:     Effort: Pulmonary effort is normal.  Abdominal:     General: Abdomen is flat.     Tenderness: There is no abdominal tenderness.  Musculoskeletal:     Left shoulder: Tenderness present. No swelling, deformity, effusion, laceration, bony tenderness or crepitus. Decreased range of motion. Normal strength. Normal pulse.     Cervical back: Normal range of motion. Tenderness present. No rigidity. Pain with movement and muscular  tenderness present. No spinous process tenderness.     Comments: Pulses present, capillary refill is intact, strength is normal. Limited ROM without being able to overhead reach.    Skin:    General: Skin is warm and dry.  Neurological:     Mental Status: He is alert and oriented to person, place, and time.     Comments: No facial asymmetry, no dysarthria.     ED Results / Procedures / Treatments  Labs (all labs ordered are listed, but only abnormal results are displayed) Labs Reviewed - No data to display  EKG None  Radiology DG Shoulder Left  Result Date: 08/13/2019 CLINICAL DATA:  Left shoulder pop after moving today with numbness and tingling in the left upper extremity. Reported left shoulder injury 2 years prior. EXAM: LEFT SHOULDER - 2+ VIEW COMPARISON:  None. FINDINGS: There is no evidence of fracture or dislocation. There is no evidence of arthropathy or other focal bone abnormality. Soft tissues are unremarkable. IMPRESSION: Left shoulder fracture or malalignment.  No significant arthropathy. Electronically Signed   By: Delbert Phenix M.D.   On: 08/13/2019 20:05    Procedures Procedures (including critical care time)  Medications Ordered in ED Medications - No data to display  ED Course  I have reviewed the triage vital signs and the nursing notes.  Pertinent labs & imaging results that were available during my care of the patient were reviewed by me and considered in my medical decision making (see chart for details).    MDM Rules/Calculators/A&P   Patient with a past medical history of left labral tear presents to the ED with complaints of left shoulder pain worsening today while at work.  Patient reports he had a previous tear in the past approximately a couple of years ago, this has been managed by Dr. Katrinka Blazing of internal medicine.  He received steroids for this pain without any improvement in his symptoms.  According to his records which have extensively review, he  received RPR injections which he did not have any progress with.  He did ask for a referral to orthopedics, however according to his records he has not been seen by any orthopedic physician.  He also endorses left-sided neck pain, this is worse with movement and ranging of his neck.  Does not have any neck rigidity, fevers, blurry vision, headaches, lower suspicion for meningitis.  Not have any chest pain, shortness of breath.  Does have some tingling to his left arm with movement of his left shoulder.  Palpable deformity on my exam, he is neurovascularly intact.  X-ray of his left shoulder without any obvious deformity, no separation of the [facility] joint noted.  Will have patient follow-up with orthopedics.  He will go home on a short course of muscle relaxers to help with his pain.  Patient shows and agrees to management, return precautions discussed at length.   Portions of this note were generated with Scientist, clinical (histocompatibility and immunogenetics). Dictation errors may occur despite best attempts at proofreading.  Final Clinical Impression(s) / ED Diagnoses Final diagnoses:  Neck pain  Left shoulder pain, unspecified chronicity    Rx / DC Orders ED Discharge Orders         Ordered    cyclobenzaprine (FLEXERIL) 10 MG tablet  2 times daily PRN     08/13/19 1934           Claude Manges, PA-C 08/13/19 2025    Arby Barrette, MD 08/14/19 1456

## 2019-08-13 NOTE — ED Triage Notes (Signed)
Patient reports that he has an old injury to the left shoulder that occurred 2 years ago.   Patient states he was moving something today and felt a pop to the left shoulder and is now having numbness and tingling of the left arm to his finger.  Patient also reports that 2 weeks ago he began having pain and rightness to the left lateral neck and finds it difficult to turn his head .

## 2019-08-13 NOTE — Discharge Instructions (Addendum)
Your x-ray today was normal.  I prescribed a short course of muscle relaxers to help with your pain, please take 1 tablet twice a day for the next 7 days.  Please be aware this medication can make you drowsy, do not drink alcohol or drive while taking this medication.  You will ultimately need to follow-up with an orthopedic surgeon in order to obtain further repair of your labral tear.

## 2019-08-23 ENCOUNTER — Other Ambulatory Visit: Payer: Self-pay

## 2019-08-23 ENCOUNTER — Encounter: Payer: Self-pay | Admitting: Family Medicine

## 2019-08-23 ENCOUNTER — Ambulatory Visit (INDEPENDENT_AMBULATORY_CARE_PROVIDER_SITE_OTHER): Payer: BC Managed Care – PPO

## 2019-08-23 ENCOUNTER — Ambulatory Visit (INDEPENDENT_AMBULATORY_CARE_PROVIDER_SITE_OTHER): Payer: BC Managed Care – PPO | Admitting: Family Medicine

## 2019-08-23 VITALS — BP 116/84 | HR 89 | Ht 71.0 in | Wt 271.0 lb

## 2019-08-23 DIAGNOSIS — M542 Cervicalgia: Secondary | ICD-10-CM | POA: Diagnosis not present

## 2019-08-23 DIAGNOSIS — M545 Low back pain, unspecified: Secondary | ICD-10-CM

## 2019-08-23 DIAGNOSIS — M25512 Pain in left shoulder: Secondary | ICD-10-CM

## 2019-08-23 DIAGNOSIS — M5010 Cervical disc disorder with radiculopathy, unspecified cervical region: Secondary | ICD-10-CM | POA: Diagnosis not present

## 2019-08-23 DIAGNOSIS — M999 Biomechanical lesion, unspecified: Secondary | ICD-10-CM

## 2019-08-23 DIAGNOSIS — G8929 Other chronic pain: Secondary | ICD-10-CM

## 2019-08-23 DIAGNOSIS — G5601 Carpal tunnel syndrome, right upper limb: Secondary | ICD-10-CM | POA: Diagnosis not present

## 2019-08-23 DIAGNOSIS — S43432D Superior glenoid labrum lesion of left shoulder, subsequent encounter: Secondary | ICD-10-CM

## 2019-08-23 NOTE — Assessment & Plan Note (Signed)
Has an difficulty, discussed medication management including the meloxicam.  Has tried other medications but has had allergies to multiple ones over the course of time.  Is on the low dose of the Effexor.  Follow-up again in 4 to 8 weeks

## 2019-08-23 NOTE — Patient Instructions (Addendum)
Good to see you Exercise 3 times a week Xray today See me again in 6 weeks Epidural ordered

## 2019-08-23 NOTE — Assessment & Plan Note (Signed)
New back pain.  Multifactorial.  Responded well to manipulation.  Home exercises given for tightness with hip flexors.  We discussed icing regimen and home exercise.  Increase activity slowly.  Follow-up again in 4 to 8 weeks.

## 2019-08-23 NOTE — Assessment & Plan Note (Signed)

## 2019-08-23 NOTE — Assessment & Plan Note (Signed)
Chronic problem with exacerbation.  Patient has some fraying of the labral noted and has failed all conservative therapy including multiple medications including meloxicam, muscle relaxer, icing regimen and home exercises.  Differential includes potential cervical radiculopathy.  If patient does not make any significant improvement consider the possibility of an epidural which was ordered today as well.

## 2019-08-23 NOTE — Progress Notes (Signed)
[name] [address] [place] [address] Phone: [phone] Subjective:   I Ronelle Nigh am serving as a Neurosurgeon for Dr. Antoine Primas.  This visit occurred during the SARS-CoV-2 public health emergency.  Safety protocols were in place, including screening questions prior to the visit, additional usage of staff PPE, and extensive cleaning of exam room while observing appropriate contact time as indicated for disinfecting solutions.   I'm seeing this patient by the request  of:  Patient, No Pcp Per  CC: Low back pain, continued shoulder pain  [id number]   06/02/2019 Labral tear - Referred to orthopedic surgery to discuss surgical management with patient failing all other conservative therapy.  Secondary to the severe pain in multiple complaints patient was giving different medications as well as injections today  Carpal tunnel right - Encourage patient to follow-up with a hand surgeon.  Patient at this point does have mild to moderate carpal tunnel on nerve conduction test but has failed all other conservative therapy.  Patient feels the only thing that will give him benefit at this moment is surgical intervention  Jacob Nixon. is a 29 y.o. male coming in with complaint of left shoulder and back pain. Shoulder is a lot worse. Went to ER about a week ago and they told him he could have torn his labral more. Was put in a sling which made the pain worse. Back pain mid back to the right side. Patient states weakness in the legs bilaterally. Sharp pain with deep breaths and certain movements. Back feels tight. Has tried his dad's percocet and that barely helped. Used muscle relaxer as well.  Patient continues have difficulty with the left shoulder.  We discussed the possibility of a new MRI.  Due to phytate and financial constraints patient is unable to afford the MRI.  He will need to know what else can be done at the moment. Continue neck pain and  headache pain.  Possible syrinx.  Did not get the medication secondary to cost as well.       Past Medical History:  Diagnosis Date  . Bronchitis   . Cellulitis of right hand - RECURRENT   . Kidney stones    Past Surgical History:  Procedure Laterality Date  . CARPAL TUNNEL RELEASE Left 04/09/2016   Procedure: CARPAL TUNNEL RELEASE;  Surgeon: Cammy Copa, MD;  Location: [facility] OR;  Service: Orthopedics;  Laterality: Left;  . HAND SURGERY    . KIDNEY STONE SURGERY     lithortripsy   Social History   Socioeconomic History  . Marital status: Married    Spouse name: Not on file  . Number of children: Not on file  . Years of education: Not on file  . Highest education level: Not on file  Occupational History  . Not on file  Tobacco Use  . Smoking status: Former Games developer  . Smokeless tobacco: Never Used  Substance and Sexual Activity  . Alcohol use: Yes    Comment: 2-3 times a month   . Drug use: No  . Sexual activity: Not on file  Other Topics Concern  . Not on file  Social History Narrative  . Not on file   Social Determinants of Health   Financial Resource Strain:   . Difficulty of Paying Living Expenses:   Food Insecurity:   . Worried About Programme researcher, broadcasting/film/video in the Last Year:   . [employer] of Food in the Last  Year:   Transportation Needs:   . Freight forwarder (Medical):   Marland Kitchen Lack of Transportation (Non-Medical):   Physical Activity:   . Days of Exercise per Week:   . Minutes of Exercise per Session:   Stress:   . Feeling of Stress :   Social Connections:   . Frequency of Communication with Friends and Family:   . Frequency of Social Gatherings with Friends and Family:   . Attends Religious Services:   . Active Member of Clubs or Organizations:   . Attends Banker Meetings:   Marland Kitchen Marital Status:    Allergies  Allergen Reactions  . Coconut Flavor     Scratchy throat  . Fentanyl     Pt reports feeling sick with fentanyl  .  Penicillins     DID THE REACTION INVOLVE: Swelling of the face/tongue/throat, SOB, or low BP? Y Sudden or severe rash/hives, skin peeling, or the inside of the mouth or nose? N  Did it require medical treatment? Y When did it last happen?January 2020 If all above answers are "NO", may proceed with cephalosporin use.    Family History  Problem Relation Age of Onset  . Hypertension Other   . Diabetes Father     Current Outpatient Medications (Endocrine & Metabolic):  .  predniSONE (DELTASONE) 50 MG tablet, Take 1 tablet (50 mg total) by mouth daily.  Current Outpatient Medications (Cardiovascular):  .  acetaZOLAMIDE (DIAMOX) 125 MG tablet, Take 1 tablet (125 mg total) by mouth 2 (two) times daily. .  nitroGLYCERIN (NITRODUR - DOSED IN MG/24 HR) 0.2 mg/hr patch, 1/4 patch daily  Current Outpatient Medications (Respiratory):  .  montelukast (SINGULAIR) 10 MG tablet, Take 1 tablet (10 mg total) by mouth at bedtime.  Current Outpatient Medications (Analgesics):  .  meloxicam (MOBIC) 15 MG tablet, Take 1 tablet (15 mg total) by mouth daily. .  traMADol (ULTRAM) 50 MG tablet, Take 1 tablet (50 mg total) by mouth every 6 (six) hours as needed.   Current Outpatient Medications (Other):  .  clindamycin (CLEOCIN) 150 MG capsule, Take 2 capsules (300 mg total) by mouth 3 (three) times daily. May dispense as 150mg  capsules .  methocarbamol (ROBAXIN) 500 MG tablet, Take 1 tablet (500 mg total) by mouth 2 (two) times daily. .  ondansetron (ZOFRAN ODT) 4 MG disintegrating tablet, Take 1 tablet (4 mg total) by mouth every 8 (eight) hours as needed for nausea or vomiting. .  tamsulosin (FLOMAX) 0.4 MG CAPS capsule, Take 1 capsule (0.4 mg total) by mouth daily. .  traZODone (DESYREL) 50 MG tablet, Take 0.5-1 tablets (25-50 mg total) by mouth at bedtime as needed for sleep.  venlafaxine XR (EFFEXOR XR) 37.5 MG 24 hr capsule, Take 1 capsule (37.5 mg total) by mouth daily with breakfast.    Reviewed prior external information including notes and imaging from  primary care provider As well as notes that were available from care everywhere and other healthcare systems.  Past medical history, social, surgical and family history all reviewed in electronic medical record.  No pertanent information unless stated regarding to the chief complaint.   Review of Systems:  No headache, visual changes, nausea, vomiting, diarrhea, constipation, dizziness, abdominal pain, skin rash, fevers, chills, night sweats, weight loss, swollen lymph nodes, body aches, joint swelling, chest pain, shortness of breath, mood changes. POSITIVE muscle aches  Objective  Blood pressure 116/84, pulse 89, height 5\' 11"  (1.803 m), weight 271 lb (122.9 kg), SpO2 93 %.  General: No apparent distress alert and oriented x3 mood and affect normal, dressed appropriately.  HEENT: Pupils equal, extraocular movements intact  Respiratory: Patient's speak in full sentences and does not appear short of breath  Cardiovascular: No lower extremity edema, non tender, no erythema  Neuro: Cranial nerves II through XII are intact, neurovascularly intact in all extremities with 2+ DTRs and 2+ pulses.  Gait normal with good balance and coordination.  MSK:  Non tender with full range of motion and good stability and symmetric strength and tone of , elbows, wrist, hip, knee and ankles bilaterally.  Shoulder exam does have positive O'Brien's.  Negative weakness but patient does have voluntary guarding that is fairly significant. Neck exam does have some mild loss of lordosis. Low back exam does have poor core strength.  Tender to palpation diffusely in the paraspinal musculature lumbar spine right greater than left.  Mild positive right FABER test.  Osteopathic findings T2 extended rotated and side bent left L2 flexed rotated and side bent right Sacrum right on right  Procedure: Real-time Ultrasound Guided Injection of left  glenohumeral joint Device: GE Logiq E  Ultrasound guided injection is preferred based studies that show increased duration, increased effect, greater accuracy, decreased procedural pain, increased response rate with ultrasound guided versus blind injection.  Verbal informed consent obtained.  Time-out conducted.  Noted no overlying erythema, induration, or other signs of local infection.  Skin prepped in a sterile fashion.  Local anesthesia: Topical Ethyl chloride.  With sterile technique and under real time ultrasound guidance:  Joint visualized.  21g 2 inch needle inserted posterior approach. Pictures taken for needle placement. Patient did have injection of 2 cc of 0.5% Marcaine, and 1cc of Kenalog 40 mg/dL. Completed without difficulty  Pain immediately resolved suggesting accurate placement of the medication.  Advised to call if fevers/chills, erythema, induration, drainage, or persistent bleeding.  Images permanently stored and available for review in the ultrasound unit.  Impression: Technically successful ultrasound guided injection.    Impression and Recommendations:     This case required medical decision making of moderate complexity. The above documentation has been reviewed and is accurate and complete Lyndal Pulley, DO       Note: This dictation was prepared with Dragon dictation along with smaller phrase technology. Any transcriptional errors that result from this process are unintentional.

## 2019-08-26 ENCOUNTER — Other Ambulatory Visit: Payer: Self-pay

## 2019-08-26 ENCOUNTER — Ambulatory Visit
Admission: RE | Admit: 2019-08-26 | Discharge: 2019-08-26 | Disposition: A | Discharge status: Home / Self Care | Payer: BC Managed Care – PPO | Source: Ambulatory Visit | Attending: Family Medicine | Admitting: Family Medicine

## 2019-08-26 DIAGNOSIS — M542 Cervicalgia: Secondary | ICD-10-CM | POA: Diagnosis not present

## 2019-08-26 DIAGNOSIS — M5010 Cervical disc disorder with radiculopathy, unspecified cervical region: Secondary | ICD-10-CM

## 2019-08-26 MED ORDER — IOPAMIDOL (ISOVUE-M 300) INJECTION 61%
1.0000 mL | Freq: Once | INTRAMUSCULAR | Status: AC
  Administered 2019-08-26: 1 mL via EPIDURAL

## 2019-08-26 MED ORDER — TRIAMCINOLONE ACETONIDE 40 MG/ML IJ SUSP (RADIOLOGY)
60.0000 mg | Freq: Once | INTRAMUSCULAR | Status: AC
  Administered 2019-08-26: 60 mg via EPIDURAL

## 2019-08-26 NOTE — Discharge Instructions (Signed)

## 2019-09-13 DIAGNOSIS — Z20828 Contact with and (suspected) exposure to other viral communicable diseases: Secondary | ICD-10-CM | POA: Diagnosis not present

## 2019-09-13 DIAGNOSIS — Z20822 Contact with and (suspected) exposure to covid-19: Secondary | ICD-10-CM | POA: Diagnosis not present

## 2019-09-17 DIAGNOSIS — G5601 Carpal tunnel syndrome, right upper limb: Secondary | ICD-10-CM | POA: Diagnosis not present

## 2019-10-04 ENCOUNTER — Ambulatory Visit: Payer: BC Managed Care – PPO | Admitting: Family Medicine

## 2019-10-04 DIAGNOSIS — Z0289 Encounter for other administrative examinations: Secondary | ICD-10-CM

## 2019-10-04 NOTE — Progress Notes (Deleted)
[facility] [address] [place] [address] Phone: [phone] Subjective:    I'm seeing this patient by the request  of:  Patient, No Pcp Per  CC:   [id number]   08/23/2019 New back pain.  Multifactorial.  Responded well to manipulation.  Home exercises given for tightness with hip flexors.  We discussed icing regimen and home exercise.  Increase activity slowly.  Follow-up again in 4 to 8 weeks.  Has an difficulty, discussed medication management including the meloxicam.  Has tried other medications but has had allergies to multiple ones over the course of time.  Is on the low dose of the Effexor.  Follow-up again in 4 to 8 weeks  Chronic problem with exacerbation.  Patient has some fraying of the labral noted and has failed all conservative therapy including multiple medications including meloxicam, muscle relaxer, icing regimen and home exercises.  Differential includes potential cervical radiculopathy.  If patient does not make any significant improvement consider the possibility of an epidural which was ordered today as well.  Update 10/04/2019 [name]. is a 29 y.o. male coming in with complaint of left shoulder and neck pain. Patient had epidural on April 8th. Patient states        Past Medical History:  Diagnosis Date  . Bronchitis   . Cellulitis of right hand - RECURRENT   . Kidney stones    Past Surgical History:  Procedure Laterality Date  . CARPAL TUNNEL RELEASE Left 04/09/2016   Procedure: CARPAL TUNNEL RELEASE;  Surgeon: Cammy Copa, MD;  Location: [facility] OR;  Service: Orthopedics;  Laterality: Left;  . HAND SURGERY    . KIDNEY STONE SURGERY     lithortripsy   Social History   Socioeconomic History  . Marital status: Married    Spouse name: Not on file  . Number of children: Not on file  . Years of education: Not on file  . Highest education level: Not on file  Occupational History  . Not on file   Tobacco Use  . Smoking status: Former Games developer  . Smokeless tobacco: Never Used  Substance and Sexual Activity  . Alcohol use: Yes    Comment: 2-3 times a month   . Drug use: No  . Sexual activity: Not on file  Other Topics Concern  . Not on file  Social History Narrative  . Not on file   Social Determinants of Health   Financial Resource Strain:   . Difficulty of Paying Living Expenses:   Food Insecurity:   . Worried About Programme researcher, broadcasting/film/video in the Last Year:   . Barista in the Last Year:   Transportation Needs:   . Freight forwarder (Medical):   Marland Kitchen Lack of Transportation (Non-Medical):   Physical Activity:   . Days of Exercise per Week:   . Minutes of Exercise per Session:   Stress:   . Feeling of Stress :   Social Connections:   . Frequency of Communication with Friends and Family:   . Frequency of Social Gatherings with Friends and Family:   . Attends Religious Services:   . Active Member of Clubs or Organizations:   . Attends Banker Meetings:   Marland Kitchen Marital Status:    Allergies  Allergen Reactions  . Coconut Flavor     Scratchy throat  . Fentanyl     Pt reports feeling sick with fentanyl  . Penicillins  DID THE REACTION INVOLVE: Swelling of the face/tongue/throat, SOB, or low BP? Y Sudden or severe rash/hives, skin peeling, or the inside of the mouth or nose? N  Did it require medical treatment? Y When did it last happen?January 2020 If all above answers are "NO", may proceed with cephalosporin use.    Family History  Problem Relation Age of Onset  . Hypertension Other   . Diabetes Father     Current Outpatient Medications (Endocrine & Metabolic):  .  predniSONE (DELTASONE) 50 MG tablet, Take 1 tablet (50 mg total) by mouth daily.  Current Outpatient Medications (Cardiovascular):  .  acetaZOLAMIDE (DIAMOX) 125 MG tablet, Take 1 tablet (125 mg total) by mouth 2 (two) times daily. .  nitroGLYCERIN (NITRODUR - DOSED IN MG/24  HR) 0.2 mg/hr patch, 1/4 patch daily  Current Outpatient Medications (Respiratory):  .  montelukast (SINGULAIR) 10 MG tablet, Take 1 tablet (10 mg total) by mouth at bedtime.  Current Outpatient Medications (Analgesics):  .  meloxicam (MOBIC) 15 MG tablet, Take 1 tablet (15 mg total) by mouth daily. .  traMADol (ULTRAM) 50 MG tablet, Take 1 tablet (50 mg total) by mouth every 6 (six) hours as needed.   Current Outpatient Medications (Other):  .  clindamycin (CLEOCIN) 150 MG capsule, Take 2 capsules (300 mg total) by mouth 3 (three) times daily. May dispense as 150mg  capsules .  methocarbamol (ROBAXIN) 500 MG tablet, Take 1 tablet (500 mg total) by mouth 2 (two) times daily. .  ondansetron (ZOFRAN ODT) 4 MG disintegrating tablet, Take 1 tablet (4 mg total) by mouth every 8 (eight) hours as needed for nausea or vomiting. .  tamsulosin (FLOMAX) 0.4 MG CAPS capsule, Take 1 capsule (0.4 mg total) by mouth daily. .  traZODone (DESYREL) 50 MG tablet, Take 0.5-1 tablets (25-50 mg total) by mouth at bedtime as needed for sleep. Marland Kitchen  venlafaxine XR (EFFEXOR XR) 37.5 MG 24 hr capsule, Take 1 capsule (37.5 mg total) by mouth daily with breakfast.   Reviewed prior external information including notes and imaging from  primary care provider As well as notes that were available from care everywhere and other healthcare systems.  Past medical history, social, surgical and family history all reviewed in electronic medical record.  No pertanent information unless stated regarding to the chief complaint.   Review of Systems:  No headache, visual changes, nausea, vomiting, diarrhea, constipation, dizziness, abdominal pain, skin rash, fevers, chills, night sweats, weight loss, swollen lymph nodes, body aches, joint swelling, chest pain, shortness of breath, mood changes. POSITIVE muscle aches  Objective  There were no vitals taken for this visit.   General: No apparent distress alert and oriented x3 mood and  affect normal, dressed appropriately.  HEENT: Pupils equal, extraocular movements intact  Respiratory: Patient's speak in full sentences and does not appear short of breath  Cardiovascular: No lower extremity edema, non tender, no erythema  Neuro: Cranial nerves II through XII are intact, neurovascularly intact in all extremities with 2+ DTRs and 2+ pulses.  Gait normal with good balance and coordination.  MSK:  Non tender with full range of motion and good stability and symmetric strength and tone of shoulders, elbows, wrist, hip, knee and ankles bilaterally.     Impression and Recommendations:     This case required medical decision making of moderate complexity. The above documentation has been reviewed and is accurate and complete Jacqualin Combes       Note: This dictation was prepared with Viviann Spare  dictation along with smaller phrase technology. Any transcriptional errors that result from this process are unintentional.

## 2019-10-12 ENCOUNTER — Emergency Department (HOSPITAL_COMMUNITY): Payer: BC Managed Care – PPO

## 2019-10-12 ENCOUNTER — Emergency Department (HOSPITAL_COMMUNITY)
Admission: EM | Admit: 2019-10-12 | Discharge: 2019-10-12 | Disposition: A | Discharge status: Home / Self Care | Payer: BC Managed Care – PPO | Attending: Emergency Medicine | Admitting: Emergency Medicine

## 2019-10-12 ENCOUNTER — Other Ambulatory Visit: Payer: Self-pay

## 2019-10-12 ENCOUNTER — Encounter (HOSPITAL_COMMUNITY): Payer: Self-pay

## 2019-10-12 DIAGNOSIS — M25562 Pain in left knee: Secondary | ICD-10-CM

## 2019-10-12 DIAGNOSIS — Y9389 Activity, other specified: Secondary | ICD-10-CM | POA: Insufficient documentation

## 2019-10-12 DIAGNOSIS — S8992XA Unspecified injury of left lower leg, initial encounter: Secondary | ICD-10-CM | POA: Diagnosis not present

## 2019-10-12 DIAGNOSIS — Y999 Unspecified external cause status: Secondary | ICD-10-CM | POA: Insufficient documentation

## 2019-10-12 DIAGNOSIS — F17290 Nicotine dependence, other tobacco product, uncomplicated: Secondary | ICD-10-CM | POA: Diagnosis not present

## 2019-10-12 DIAGNOSIS — Y92002 Bathroom of unspecified non-institutional (private) residence single-family (private) house as the place of occurrence of the external cause: Secondary | ICD-10-CM | POA: Diagnosis not present

## 2019-10-12 DIAGNOSIS — M25569 Pain in unspecified knee: Secondary | ICD-10-CM

## 2019-10-12 DIAGNOSIS — W010XXA Fall on same level from slipping, tripping and stumbling without subsequent striking against object, initial encounter: Secondary | ICD-10-CM | POA: Insufficient documentation

## 2019-10-12 DIAGNOSIS — R600 Localized edema: Secondary | ICD-10-CM | POA: Insufficient documentation

## 2019-10-12 DIAGNOSIS — R200 Anesthesia of skin: Secondary | ICD-10-CM | POA: Diagnosis not present

## 2019-10-12 DIAGNOSIS — R202 Paresthesia of skin: Secondary | ICD-10-CM | POA: Diagnosis not present

## 2019-10-12 DIAGNOSIS — Z79899 Other long term (current) drug therapy: Secondary | ICD-10-CM | POA: Diagnosis not present

## 2019-10-12 MED ORDER — KETOROLAC TROMETHAMINE 30 MG/ML IJ SOLN
30.0000 mg | Freq: Once | INTRAMUSCULAR | Status: AC
  Administered 2019-10-12: 30 mg via INTRAMUSCULAR
  Filled 2019-10-12: qty 1

## 2019-10-12 NOTE — ED Triage Notes (Signed)
Fall a few days ago and c/o left knee pain.

## 2019-10-12 NOTE — Discharge Instructions (Addendum)
Wear the knee immobilizer to support your knee.  Use the crutches until you are able to bear weight on your leg.  Elevate your leg and use ice packs to help reduce the swelling.  Take ibuprofen 600 mg 4 times a day for pain.  Please call [facility] to get an appointment to have an orthopedist reevaluate your knee.

## 2019-10-12 NOTE — ED Provider Notes (Signed)
[facility]-EMERGENCY DEPT Provider Note   CSN: [id number] Arrival date & time: 10/12/19  0021   Time seen 2:10 AM  History Chief Complaint  Patient presents with  . Knee Pain    Jacob Nixon. is a 29 y.o. male.  HPI   Patient states "a couple nights ago" he had gotten up at 3 AM to go to the bathroom and he tripped over a child's toy and fell landing onto his left knee on the tile floor of the bathroom.  He states he immediately had pain.  Since then he has been unable to bear weight on that leg.  He states today there is tingling and a loss of feeling in his toes on the left foot.  He states straightening his leg hurts in the process of bending it but he is most comfortable if his knee slightly bent.  He has taken tramadol and Flexeril tonight without relief.  He states his orthopedist is at [facility] and is a Hydrographic surveyor who took care of his carpal tunnel.  Patient states he was vaping on the way to the ED.  PCP Patient, No Pcp Per   Past Medical History:  Diagnosis Date  . Bronchitis   . Cellulitis of right hand - RECURRENT   . Kidney stones     Patient Active Problem List   Diagnosis Date Noted  . Cervical disc disorder with radiculopathy of cervical region 08/23/2019  . Low back pain 08/23/2019  . Nonallopathic lesion of lumbosacral region 08/23/2019  . Nonallopathic lesion of sacral region 08/23/2019  . Nonallopathic lesion of thoracic region 08/23/2019  . Labral tear of shoulder 12/15/2018  . Hyponatremia 10/26/2016  . Hypokalemia 10/26/2016  . Elevated glucose level   . Cellulitis of hand 08/04/2016  . Carpal tunnel syndrome on right 05/08/2016  . Abscess of upper arm   . Cellulitis 01/22/2016  . Leukocytosis 01/22/2016  . Neuropathy 01/22/2016  . Nephrolithiasis 01/22/2016  . Cellulitis of right hand   . Cellulitis of forearm, right 07/30/2015    Past Surgical History:  Procedure Laterality Date  . CARPAL TUNNEL  RELEASE Left 04/09/2016   Procedure: CARPAL TUNNEL RELEASE;  Surgeon: Cammy Copa, MD;  Location: [facility] OR;  Service: Orthopedics;  Laterality: Left;  . HAND SURGERY    . KIDNEY STONE SURGERY     lithortripsy       Family History  Problem Relation Age of Onset  . Hypertension Other   . Diabetes Father     Social History   Tobacco Use  . Smoking status: Former Games developer  . Smokeless tobacco: Never Used  Substance Use Topics  . Alcohol use: Yes    Comment: 2-3 times a month   . Drug use: No    Home Medications Prior to Admission medications   Medication Sig Start Date End Date Taking? Authorizing Provider  acetaZOLAMIDE (DIAMOX) 125 MG tablet Take 1 tablet (125 mg total) by mouth 2 (two) times daily. 06/02/19   Judi Saa, DO  clindamycin (CLEOCIN) 150 MG capsule Take 2 capsules (300 mg total) by mouth 3 (three) times daily. May dispense as 150mg  capsules 02/02/19   02/04/19, PA-C  meloxicam (MOBIC) 15 MG tablet Take 1 tablet (15 mg total) by mouth daily. 01/13/19   01/15/19, DO  methocarbamol (ROBAXIN) 500 MG tablet Take 1 tablet (500 mg total) by mouth 2 (two) times daily. 04/28/19   Joy, 14/9/20, PA-C  montelukast (SINGULAIR) 10 MG tablet Take 1 tablet (10 mg total) by mouth at bedtime. 12/15/18   Lyndal Pulley, DO  nitroGLYCERIN (NITRODUR - DOSED IN MG/24 HR) 0.2 mg/hr patch 1/4 patch daily 12/15/18   Lyndal Pulley, DO  ondansetron (ZOFRAN ODT) 4 MG disintegrating tablet Take 1 tablet (4 mg total) by mouth every 8 (eight) hours as needed for nausea or vomiting. 10/11/18   Horton, Barbette Hair, MD  predniSONE (DELTASONE) 50 MG tablet Take 1 tablet (50 mg total) by mouth daily. 06/02/19   Lyndal Pulley, DO  tamsulosin (FLOMAX) 0.4 MG CAPS capsule Take 1 capsule (0.4 mg total) by mouth daily. 10/11/18   Horton, Barbette Hair, MD  traMADol (ULTRAM) 50 MG tablet Take 1 tablet (50 mg total) by mouth every 6 (six) hours as needed. 06/02/19   Lyndal Pulley, DO    traZODone (DESYREL) 50 MG tablet Take 0.5-1 tablets (25-50 mg total) by mouth at bedtime as needed for sleep. 06/02/19   Lyndal Pulley, DO  venlafaxine XR (EFFEXOR XR) 37.5 MG 24 hr capsule Take 1 capsule (37.5 mg total) by mouth daily with breakfast. 02/12/19   Lyndal Pulley, DO    Allergies    Coconut flavor, Fentanyl, and Penicillins  Review of Systems   Review of Systems  All other systems reviewed and are negative.   Physical Exam Updated Vital Signs BP (!) 132/102 (BP Location: Right Arm)   Pulse 97   Temp 99 F (37.2 C) (Oral)   Resp 16   Ht 5\' 11"  (1.803 m)   Wt 117.9 kg   SpO2 98%   BMI 36.26 kg/m   Physical Exam Vitals and nursing note reviewed.  Constitutional:      Appearance: Normal appearance. He is obese.  HENT:     Head: Normocephalic and atraumatic.  Eyes:     Extraocular Movements: Extraocular movements intact.     Conjunctiva/sclera: Conjunctivae normal.  Cardiovascular:     Rate and Rhythm: Normal rate.  Pulmonary:     Effort: Pulmonary effort is normal. No respiratory distress.  Musculoskeletal:     Cervical back: Normal range of motion.     Comments: On inspection of his knee he has mild swelling of his knee that appears to be around the patellar tendon.  He is very tender to palpation in the area.  It is hard to feel the tendon in its entirety but I can feel it underneath the swelling.  There is no obvious joint effusion.  He does not appear to be tender over the proximal fibula.  Patient has difficulty standing up.  He attempts to do straight leg raising but he states it is very painful.  There was no obvious leg drop seen.  Skin:    General: Skin is warm and dry.     Findings: No erythema, lesion or rash.  Neurological:     General: No focal deficit present.     Mental Status: He is alert and oriented to person, place, and time.     Cranial Nerves: No cranial nerve deficit.  Psychiatric:        Mood and Affect: Mood normal.         Behavior: Behavior normal.        Thought Content: Thought content normal.     ED Results / Procedures / Treatments   Labs (all labs ordered are listed, but only abnormal results are displayed) Labs Reviewed - No data to display  EKG None  Radiology DG Knee Complete 4 Views Left  Result Date: 10/12/2019 CLINICAL DATA:  Tripped and twisted knee 2 weeks ago, anterolateral pain EXAM: LEFT KNEE - COMPLETE 4+ VIEW COMPARISON:  None. FINDINGS: No evidence of fracture, dislocation, or joint effusion. No evidence of arthropathy or other focal bone abnormality. Soft tissues are unremarkable. IMPRESSION: Negative. Electronically Signed   By: Kreg Shropshire M.D.   On: 10/12/2019 01:15    Procedures Procedures (including critical care time)  Medications Ordered in ED Medications  ketorolac (TORADOL) 30 MG/ML injection 30 mg (has no administration in time range)    ED Course  I have reviewed the triage vital signs and the nursing notes.  Pertinent labs & imaging results that were available during my care of the patient were reviewed by me and considered in my medical decision making (see chart for details).    MDM Rules/Calculators/A&P                      Patient was given Toradol IM for pain.  When I review his prior labs he had blood work done April 28, 2019 and his BUN was 7 and his creatinine was 0.96.  He was placed in a knee immobilizer and crutches.  He was advised to take ibuprofen for pain and to follow-up with orthopedics.  We discussed he may need an MRI which is not available tonight.    Final Clinical Impression(s) / ED Diagnoses Final diagnoses:  Knee pain    Rx / DC Orders ED Discharge Orders    None    OTC ibuprofen   Plan discharge  Devoria Albe, MD, Concha Pyo, MD 10/12/19 [phone]

## 2019-10-13 ENCOUNTER — Ambulatory Visit (INDEPENDENT_AMBULATORY_CARE_PROVIDER_SITE_OTHER): Payer: BC Managed Care – PPO | Admitting: Family Medicine

## 2019-10-13 ENCOUNTER — Telehealth: Payer: Self-pay | Admitting: Family Medicine

## 2019-10-13 ENCOUNTER — Ambulatory Visit (INDEPENDENT_AMBULATORY_CARE_PROVIDER_SITE_OTHER): Payer: BC Managed Care – PPO

## 2019-10-13 ENCOUNTER — Encounter: Payer: Self-pay | Admitting: Family Medicine

## 2019-10-13 VITALS — BP 110/88 | HR 86 | Ht 71.0 in | Wt 260.0 lb

## 2019-10-13 DIAGNOSIS — M25562 Pain in left knee: Secondary | ICD-10-CM | POA: Diagnosis not present

## 2019-10-13 MED ORDER — MELOXICAM 15 MG PO TABS
15.0000 mg | ORAL_TABLET | Freq: Every day | ORAL | 0 refills | Status: DC
Start: 2019-10-13 — End: 2019-11-08

## 2019-10-13 NOTE — Telephone Encounter (Signed)
Pt employer will not allow any limitations and he has to work. Could we write a letter allowing him to work full duty. He said the brace is really helping. Fax to employer at [phone] and send via MyChart please.

## 2019-10-13 NOTE — Telephone Encounter (Signed)
Spoke with patient regarding note.

## 2019-10-13 NOTE — Patient Instructions (Signed)
Hinged brace during the day Note for work seated work where appropriate for 2 weeks Meloxicam daily for 10 days then as needed See me in 2-3 weeks

## 2019-10-13 NOTE — Progress Notes (Signed)
Jacob Nixon Jacob Nixon Phone: [phone] Subjective:    I'm seeing this patient by the request  of:  Patient, No Pcp Per  CC: Left knee injury  [id number]  Jacob Nixon. is a 29 y.o. male coming in with complaint of left knee pain. Once week ago patient tripped and fell on knee. Patient states pain is localized to superior and inferior patella. No history of knee injuries. Pain is constant.  Patient was seen in the emergency room.  Patient did have x-rays.  Independently visualized by me showing no bony abnormality.  Patient has been in a knee immobilizer since then.  States that the brace does not seem to be helping and if anything possibly worsening.       Past Medical History:  Diagnosis Date  . Bronchitis   . Cellulitis of right hand - RECURRENT   . Kidney stones    Past Surgical History:  Procedure Laterality Date  . CARPAL TUNNEL RELEASE Left 04/09/2016   Procedure: CARPAL TUNNEL RELEASE;  Surgeon: Meredith Pel, MD;  Location: [facility];  Service: Orthopedics;  Laterality: Left;  . HAND SURGERY    . KIDNEY STONE SURGERY     lithortripsy   Social History   Socioeconomic History  . Marital status: Married    Spouse name: Not on file  . Number of children: Not on file  . Years of education: Not on file  . Highest education level: Not on file  Occupational History  . Not on file  Tobacco Use  . Smoking status: Former Research scientist (life sciences)  . Smokeless tobacco: Never Used  Substance and Sexual Activity  . Alcohol use: Yes    Comment: 2-3 times a month   . Drug use: No  . Sexual activity: Not on file  Other Topics Concern  . Not on file  Social History Narrative  . Not on file   Social Determinants of Health   Financial Resource Strain:   . Difficulty of Paying Living Expenses:   Food Insecurity:   . Worried About Charity fundraiser in the Last Year:   . Arboriculturist in the Last Year:     Transportation Needs:   . Film/video editor (Medical):   Marland Kitchen Lack of Transportation (Non-Medical):   Physical Activity:   . Days of Exercise per Week:   . Minutes of Exercise per Session:   Stress:   . Feeling of Stress :   Social Connections:   . Frequency of Communication with Friends and Family:   . Frequency of Social Gatherings with Friends and Family:   . Attends Religious Services:   . Active Member of Clubs or Organizations:   . Attends Archivist Meetings:   Marland Kitchen Marital Status:    Allergies  Allergen Reactions  . Coconut Flavor     Scratchy throat  . Fentanyl     Pt reports feeling sick with fentanyl  . Penicillins     DID THE REACTION INVOLVE: Swelling of the face/tongue/throat, SOB, or low BP? Y Sudden or severe rash/hives, skin peeling, or the inside of the mouth or nose? N  Did it require medical treatment? Y When did it last happen?January 2020 If all above answers are "NO", may proceed with cephalosporin use.    Family History  Problem Relation Age of Onset  . Hypertension Other   . Diabetes Father     Current Outpatient Medications (  Endocrine & Metabolic):  .  predniSONE (DELTASONE) 50 MG tablet, Take 1 tablet (50 mg total) by mouth daily.  Current Outpatient Medications (Cardiovascular):  .  acetaZOLAMIDE (DIAMOX) 125 MG tablet, Take 1 tablet (125 mg total) by mouth 2 (two) times daily. .  nitroGLYCERIN (NITRODUR - DOSED IN MG/24 HR) 0.2 mg/hr patch, 1/4 patch daily  Current Outpatient Medications (Respiratory):  .  montelukast (SINGULAIR) 10 MG tablet, Take 1 tablet (10 mg total) by mouth at bedtime.  Current Outpatient Medications (Analgesics):  .  meloxicam (MOBIC) 15 MG tablet, Take 1 tablet (15 mg total) by mouth daily. .  traMADol (ULTRAM) 50 MG tablet, Take 1 tablet (50 mg total) by mouth every 6 (six) hours as needed. .  meloxicam (MOBIC) 15 MG tablet, Take 1 tablet (15 mg total) by mouth daily.   Current Outpatient  Medications (Other):  .  clindamycin (CLEOCIN) 150 MG capsule, Take 2 capsules (300 mg total) by mouth 3 (three) times daily. May dispense as 150mg  capsules .  methocarbamol (ROBAXIN) 500 MG tablet, Take 1 tablet (500 mg total) by mouth 2 (two) times daily. .  ondansetron (ZOFRAN ODT) 4 MG disintegrating tablet, Take 1 tablet (4 mg total) by mouth every 8 (eight) hours as needed for nausea or vomiting. .  tamsulosin (FLOMAX) 0.4 MG CAPS capsule, Take 1 capsule (0.4 mg total) by mouth daily. .  traZODone (DESYREL) 50 MG tablet, Take 0.5-1 tablets (25-50 mg total) by mouth at bedtime as needed for sleep.  venlafaxine XR (EFFEXOR XR) 37.5 MG 24 hr capsule, Take 1 capsule (37.5 mg total) by mouth daily with breakfast.   Reviewed prior external information including notes and imaging from  primary care provider As well as notes that were available from care everywhere and other healthcare systems.  Past medical history, social, surgical and family history all reviewed in electronic medical record.  No pertanent information unless stated regarding to the chief complaint.   Review of Systems:  No headache, visual changes, nausea, vomiting, diarrhea, constipation, dizziness, abdominal pain, skin rash, fevers, chills, night sweats, weight loss, swollen lymph nodes, body aches, joint swelling, chest pain, shortness of breath, mood changes. POSITIVE muscle aches  Objective  Blood pressure 110/88, pulse 86, height 5\' 11"  (1.803 m), weight 260 lb (117.9 kg), SpO2 98 %.   General: No apparent distress alert and oriented x3 mood and affect normal, dressed appropriately.  HEENT: Pupils equal, extraocular movements intact  Respiratory: Patient's speak in full sentences and does not appear short of breath  Cardiovascular: No lower extremity edema, non tender, no erythema  Neuro: Cranial nerves II through XII are intact, neurovascularly intact in all extremities with 2+ DTRs and 2+ pulses.  Gait  antalgic Patient has scar from recent carpal tunnel surgery on the right hand  Patient is continuing to have discomfort and pain of the left knee.  Noticed effusion noted.  Patient's extensor mechanism intact.  Severely tender over the patella itself.  Mild over the lateral joint space.  Patient has no true instability of the knee but does have voluntary and involuntary guarding when stressing the LCL  Limited musculoskeletal ultrasound was performed and interpreted by  Marland Kitchen  Limited ultrasound of patient's left knee shows the patient has questionable avulsion of the superficial layer of the patella proximally at the quadricep tendon.  Increasing Doppler flow in this area as well as of the quadricep tendon.  No retraction noted.  Mild hypoechoic changes.  Trace effusion  of the patellofemoral joint noted.  LCL has increasing Doppler flow in hypoechoic changes but no gapping noted on dynamic testing Impression: Questionable small avulsion fracture of the patella proximally seems to be vertical.  Questionable LCL injury   Impression and Recommendations:  Patient was measured and fitted for an off-the-shelf brace. Adjustments were made to brace to ensure proper fit.     This case required medical decision making of moderate complexity. The above documentation has been reviewed and is accurate and complete Jacob Saa, DO       Note: This dictation was prepared with Dragon dictation along with smaller phrase technology. Any transcriptional errors that result from this process are unintentional.

## 2019-10-13 NOTE — Assessment & Plan Note (Signed)
Patient fell directly on his knee.  Does appear to have a very small hole of the proximal aspect and potentially a small partial tear of the quadricep tendon.  Patient's extensor mechanism is intact.  Discussed with patient about icing regimen, home exercises, patient put in a hinged brace secondary to giving him some stability but to allow him to have some movement instead of the instability brace.  Patient does have some abnormality of the LCL but we will monitor.  Follow-up again in 2 to 3 weeks

## 2019-10-17 ENCOUNTER — Encounter (HOSPITAL_COMMUNITY): Payer: Self-pay

## 2019-10-17 ENCOUNTER — Other Ambulatory Visit: Payer: Self-pay

## 2019-10-17 ENCOUNTER — Ambulatory Visit (HOSPITAL_COMMUNITY)
Admission: EM | Admit: 2019-10-17 | Discharge: 2019-10-17 | Disposition: A | Discharge status: Home / Self Care | Payer: BC Managed Care – PPO | Attending: Emergency Medicine | Admitting: Emergency Medicine

## 2019-10-17 DIAGNOSIS — Z79899 Other long term (current) drug therapy: Secondary | ICD-10-CM | POA: Diagnosis not present

## 2019-10-17 DIAGNOSIS — Z7902 Long term (current) use of antithrombotics/antiplatelets: Secondary | ICD-10-CM | POA: Insufficient documentation

## 2019-10-17 DIAGNOSIS — J069 Acute upper respiratory infection, unspecified: Secondary | ICD-10-CM | POA: Insufficient documentation

## 2019-10-17 DIAGNOSIS — R05 Cough: Secondary | ICD-10-CM | POA: Insufficient documentation

## 2019-10-17 DIAGNOSIS — Z20822 Contact with and (suspected) exposure to covid-19: Secondary | ICD-10-CM | POA: Diagnosis not present

## 2019-10-17 DIAGNOSIS — Z87891 Personal history of nicotine dependence: Secondary | ICD-10-CM | POA: Diagnosis not present

## 2019-10-17 MED ORDER — BENZONATATE 200 MG PO CAPS: 200.0000 mg | ORAL_CAPSULE | Freq: Three times a day (TID) | ORAL | 0 refills | Status: AC | PRN

## 2019-10-17 MED ORDER — IBUPROFEN 800 MG PO TABS
800.0000 mg | ORAL_TABLET | Freq: Three times a day (TID) | ORAL | 0 refills | Status: DC
Start: 2019-10-17 — End: 2020-06-15

## 2019-10-17 NOTE — ED Provider Notes (Signed)
[facility]    CSN: [id number] Arrival date & time: 10/17/19  1156      History   Chief Complaint Chief Complaint  Patient presents with  . Cough  . Fever    HPI Jacob Nixon. is a 29 y.o. male presenting today for evaluation of cough and congestion.  Patient reports that beginning Wednesday he began to develop cough congestion and body aches.  He noted fever on Friday of 101.7.  Fever has not persisted since.  He has been using Mucinex, DayQuil/NyQuil.  Denies any chest pain or shortness of breath.  Reports that his daughter has been sick with similar symptoms.  Denies GI symptoms.  HPI  Past Medical History:  Diagnosis Date  . Bronchitis   . Cellulitis of right hand - RECURRENT   . Kidney stones     Patient Active Problem List   Diagnosis Date Noted  . Left anterior knee pain 10/13/2019  . Cervical disc disorder with radiculopathy of cervical region 08/23/2019  . Low back pain 08/23/2019  . Nonallopathic lesion of lumbosacral region 08/23/2019  . Nonallopathic lesion of sacral region 08/23/2019  . Nonallopathic lesion of thoracic region 08/23/2019  . Labral tear of shoulder 12/15/2018  . Hyponatremia 10/26/2016  . Hypokalemia 10/26/2016  . Elevated glucose level   . Cellulitis of hand 08/04/2016  . Carpal tunnel syndrome on right 05/08/2016  . Abscess of upper arm   . Cellulitis 01/22/2016  . Leukocytosis 01/22/2016  . Neuropathy 01/22/2016  . Nephrolithiasis 01/22/2016  . Cellulitis of right hand   . Cellulitis of forearm, right 07/30/2015    Past Surgical History:  Procedure Laterality Date  . CARPAL TUNNEL RELEASE Left 04/09/2016   Procedure: CARPAL TUNNEL RELEASE;  Surgeon: Meredith Pel, MD;  Location: [facility];  Service: Orthopedics;  Laterality: Left;  . HAND SURGERY    . KIDNEY STONE SURGERY     lithortripsy       Home Medications    Prior to Admission medications   Medication Sig Start Date End Date Taking? Authorizing  Provider  acetaZOLAMIDE (DIAMOX) 125 MG tablet Take 1 tablet (125 mg total) by mouth 2 (two) times daily. 06/02/19   Lyndal Pulley, DO  benzonatate (TESSALON) 200 MG capsule Take 1 capsule (200 mg total) by mouth 3 (three) times daily as needed for up to 7 days for cough. 10/17/19 10/24/19  Lynzee Lindquist C, PA-C  clindamycin (CLEOCIN) 150 MG capsule Take 2 capsules (300 mg total) by mouth 3 (three) times daily. May dispense as 150mg  capsules 02/02/19   Larene Pickett, PA-C  ibuprofen (ADVIL) 800 MG tablet Take 1 tablet (800 mg total) by mouth 3 (three) times daily. 10/17/19   Senita Corredor C, PA-C  meloxicam (MOBIC) 15 MG tablet Take 1 tablet (15 mg total) by mouth daily. 01/13/19   Lyndal Pulley, DO  meloxicam (MOBIC) 15 MG tablet Take 1 tablet (15 mg total) by mouth daily. 10/13/19   Lyndal Pulley, DO  methocarbamol (ROBAXIN) 500 MG tablet Take 1 tablet (500 mg total) by mouth 2 (two) times daily. 04/28/19   Joy, Shawn C, PA-C  montelukast (SINGULAIR) 10 MG tablet Take 1 tablet (10 mg total) by mouth at bedtime. 12/15/18   Lyndal Pulley, DO  nitroGLYCERIN (NITRODUR - DOSED IN MG/24 HR) 0.2 mg/hr patch 1/4 patch daily 12/15/18   Lyndal Pulley, DO  ondansetron (ZOFRAN ODT) 4 MG disintegrating tablet Take 1 tablet (4 mg total)  by mouth every 8 (eight) hours as needed for nausea or vomiting. 10/11/18   Horton, Mayer Masker, MD  predniSONE (DELTASONE) 50 MG tablet Take 1 tablet (50 mg total) by mouth daily. 06/02/19   Judi Saa, DO  tamsulosin (FLOMAX) 0.4 MG CAPS capsule Take 1 capsule (0.4 mg total) by mouth daily. 10/11/18   Horton, Mayer Masker, MD  traMADol (ULTRAM) 50 MG tablet Take 1 tablet (50 mg total) by mouth every 6 (six) hours as needed. 06/02/19   Judi Saa, DO  traZODone (DESYREL) 50 MG tablet Take 0.5-1 tablets (25-50 mg total) by mouth at bedtime as needed for sleep. 06/02/19   Judi Saa, DO  venlafaxine XR (EFFEXOR XR) 37.5 MG 24 hr capsule Take 1 capsule (37.5 mg  total) by mouth daily with breakfast. 02/12/19   Judi Saa, DO    Family History Family History  Problem Relation Age of Onset  . Hypertension Other   . Diabetes Father     Social History Social History   Tobacco Use  . Smoking status: Former Games developer  . Smokeless tobacco: Never Used  Substance Use Topics  . Alcohol use: Yes    Comment: 2-3 times a month   . Drug use: No     Allergies   Coconut flavor, Fentanyl, and Penicillins   Review of Systems Review of Systems  Constitutional: Positive for fatigue and fever. Negative for activity change, appetite change and chills.  HENT: Positive for congestion and rhinorrhea. Negative for ear pain, sinus pressure, sore throat and trouble swallowing.   Eyes: Negative for discharge and redness.  Respiratory: Positive for cough. Negative for chest tightness and shortness of breath.   Cardiovascular: Negative for chest pain.  Gastrointestinal: Negative for abdominal pain, diarrhea, nausea and vomiting.  Musculoskeletal: Positive for myalgias.  Skin: Negative for rash.  Neurological: Negative for dizziness, light-headedness and headaches.     Physical Exam Triage Vital Signs ED Triage Vitals  Enc Vitals Group     BP 10/17/19 1330 120/85     Pulse Rate 10/17/19 1330 95     Resp 10/17/19 1330 18     Temp 10/17/19 1330 98.7 F (37.1 C)     Temp src --      SpO2 10/17/19 1330 99 %     Weight --      Height --      Head Circumference --      Peak Flow --      Pain Score 10/17/19 1328 0     Pain Loc --      Pain Edu? --      Excl. in GC? --    No data found.  Updated Vital Signs BP 120/85   Pulse 95   Temp 98.7 F (37.1 C)   Resp 18   SpO2 99%   Visual Acuity Right Eye Distance:   Left Eye Distance:   Bilateral Distance:    Right Eye Near:   Left Eye Near:    Bilateral Near:     Physical Exam Vitals and nursing note reviewed.  Constitutional:      Appearance: He is well-developed.     Comments: No  acute distress  HENT:     Head: Normocephalic and atraumatic.     Ears:     Comments: Bilateral ears without tenderness to palpation of external auricle, tragus and mastoid, EAC's without erythema or swelling, TM's with good bony landmarks and cone of light. Non erythematous.  Nose: Nose normal.     Comments: Nasal mucosa mildly erythematous, nonswollen turbinates    Mouth/Throat:     Comments: Oral mucosa pink and moist, no tonsillar enlargement or exudate. Posterior pharynx patent and nonerythematous, no uvula deviation or swelling. Normal phonation.  Eyes:     Conjunctiva/sclera: Conjunctivae normal.  Cardiovascular:     Rate and Rhythm: Normal rate.  Pulmonary:     Effort: Pulmonary effort is normal. No respiratory distress.     Comments: Breathing comfortably at rest, CTABL, no wheezing, rales or other adventitious sounds auscultated Abdominal:     General: There is no distension.  Musculoskeletal:        General: Normal range of motion.     Cervical back: Neck supple.  Skin:    General: Skin is warm and dry.  Neurological:     Mental Status: He is alert and oriented to person, place, and time.      UC Treatments / Results  Labs (all labs ordered are listed, but only abnormal results are displayed) Labs Reviewed  SARS CORONAVIRUS 2 (TAT 6-24 HRS)    EKG   Radiology No results found.  Procedures Procedures (including critical care time)  Medications Ordered in UC Medications - No data to display  Initial Impression / Assessment and Plan / UC Course  I have reviewed the triage vital signs and the nursing notes.  Pertinent labs & imaging results that were available during my care of the patient were reviewed by me and considered in my medical decision making (see chart for details).     Covid test pending, URI symptoms x5 days, vital signs stable, exam unremarkable, lungs clear.  Most likely viral etiology.  Recommending symptomatic and supportive care  with close monitoring.  Rest and fluids.Discussed strict return precautions. Patient verbalized understanding and is agreeable with plan.  Final Clinical Impressions(s) / UC Diagnoses   Final diagnoses:  Viral URI with cough     Discharge Instructions     Covid test pending, monitor my chart for results Continue Mucinex or DayQuil/NyQuil for congestion Benzonatate/Tessalon as needed for cough Ibuprofen and Tylenol for fevers, body aches and headaches Rest and drink plenty of fluids  Please return if any symptoms not improving or worsening   ED Prescriptions    Medication Sig Dispense Auth. Provider   benzonatate (TESSALON) 200 MG capsule Take 1 capsule (200 mg total) by mouth 3 (three) times daily as needed for up to 7 days for cough. 28 capsule Awesome Jared C, PA-C   ibuprofen (ADVIL) 800 MG tablet Take 1 tablet (800 mg total) by mouth 3 (three) times daily. 21 tablet Shahab Polhamus, Valdez C, PA-C     PDMP not reviewed this encounter.   Lew Dawes, [place] 10/17/19 1347

## 2019-10-17 NOTE — Discharge Instructions (Signed)
Covid test pending, monitor my chart for results Continue Mucinex or DayQuil/NyQuil for congestion Benzonatate/Tessalon as needed for cough Ibuprofen and Tylenol for fevers, body aches and headaches Rest and drink plenty of fluids  Please return if any symptoms not improving or worsening

## 2019-10-17 NOTE — ED Triage Notes (Signed)
Pt presents with c/o cough body aches and fever since wednesday

## 2019-10-18 LAB — SARS CORONAVIRUS 2 (TAT 6-24 HRS): SARS Coronavirus 2: NEGATIVE

## 2019-10-28 ENCOUNTER — Ambulatory Visit: Payer: BC Managed Care – PPO | Admitting: Family Medicine

## 2019-10-28 NOTE — Progress Notes (Deleted)
Jacob Nixon Phone: [phone] Subjective:    I'm seeing this patient by the request  of:  Patient, No Pcp Per  CC:   [id number]  Duel Conrad. is a 29 y.o. male coming in with complaint of ***  Onset-  Location Duration-  Character- Aggravating factors- Reliving factors-  Therapies tried-  Severity-     Past Medical History:  Diagnosis Date  . Bronchitis   . Cellulitis of right hand - RECURRENT   . Kidney stones    Past Surgical History:  Procedure Laterality Date  . CARPAL TUNNEL RELEASE Left 04/09/2016   Procedure: CARPAL TUNNEL RELEASE;  Surgeon: Meredith Pel, MD;  Location: [facility];  Service: Orthopedics;  Laterality: Left;  . HAND SURGERY    . KIDNEY STONE SURGERY     lithortripsy   Social History   Socioeconomic History  . Marital status: Married    Spouse name: Not on file  . Number of children: Not on file  . Years of education: Not on file  . Highest education level: Not on file  Occupational History  . Not on file  Tobacco Use  . Smoking status: Former Research scientist (life sciences)  . Smokeless tobacco: Never Used  Vaping Use  . Vaping Use: Every day  . Substances: Nicotine, Flavoring  Substance and Sexual Activity  . Alcohol use: Yes    Comment: 2-3 times a month   . Drug use: No  . Sexual activity: Not on file  Other Topics Concern  . Not on file  Social History Narrative  . Not on file   Social Determinants of Health   Financial Resource Strain:   . Difficulty of Paying Living Expenses:   Food Insecurity:   . Worried About Charity fundraiser in the Last Year:   . Arboriculturist in the Last Year:   Transportation Needs:   . Film/video editor (Medical):   Marland Kitchen Lack of Transportation (Non-Medical):   Physical Activity:   . Days of Exercise per Week:   . Minutes of Exercise per Session:   Stress:   . Feeling of Stress :   Social Connections:   . Frequency of  Communication with Friends and Family:   . Frequency of Social Gatherings with Friends and Family:   . Attends Religious Services:   . Active Member of Clubs or Organizations:   . Attends Archivist Meetings:   Marland Kitchen Marital Status:    Allergies  Allergen Reactions  . Coconut Flavor     Scratchy throat  . Fentanyl     Pt reports feeling sick with fentanyl  . Penicillins     DID THE REACTION INVOLVE: Swelling of the face/tongue/throat, SOB, or low BP? Y Sudden or severe rash/hives, skin peeling, or the inside of the mouth or nose? N  Did it require medical treatment? Y When did it last happen?January 2020 If all above answers are "NO", may proceed with cephalosporin use.    Family History  Problem Relation Age of Onset  . Hypertension Other   . Diabetes Father     Current Outpatient Medications (Endocrine & Metabolic):  .  predniSONE (DELTASONE) 50 MG tablet, Take 1 tablet (50 mg total) by mouth daily.  Current Outpatient Medications (Cardiovascular):  .  acetaZOLAMIDE (DIAMOX) 125 MG tablet, Take 1 tablet (125 mg total) by mouth 2 (two) times daily. .  nitroGLYCERIN (NITRODUR - DOSED IN  MG/24 HR) 0.2 mg/hr patch, 1/4 patch daily  Current Outpatient Medications (Respiratory):  .  montelukast (SINGULAIR) 10 MG tablet, Take 1 tablet (10 mg total) by mouth at bedtime.  Current Outpatient Medications (Analgesics):  .  ibuprofen (ADVIL) 800 MG tablet, Take 1 tablet (800 mg total) by mouth 3 (three) times daily. .  meloxicam (MOBIC) 15 MG tablet, Take 1 tablet (15 mg total) by mouth daily. .  meloxicam (MOBIC) 15 MG tablet, Take 1 tablet (15 mg total) by mouth daily. .  traMADol (ULTRAM) 50 MG tablet, Take 1 tablet (50 mg total) by mouth every 6 (six) hours as needed.   Current Outpatient Medications (Other):  .  clindamycin (CLEOCIN) 150 MG capsule, Take 2 capsules (300 mg total) by mouth 3 (three) times daily. May dispense as 150mg  capsules .  methocarbamol (ROBAXIN)  500 MG tablet, Take 1 tablet (500 mg total) by mouth 2 (two) times daily. .  ondansetron (ZOFRAN ODT) 4 MG disintegrating tablet, Take 1 tablet (4 mg total) by mouth every 8 (eight) hours as needed for nausea or vomiting. .  tamsulosin (FLOMAX) 0.4 MG CAPS capsule, Take 1 capsule (0.4 mg total) by mouth daily. .  traZODone (DESYREL) 50 MG tablet, Take 0.5-1 tablets (25-50 mg total) by mouth at bedtime as needed for sleep.  venlafaxine XR (EFFEXOR XR) 37.5 MG 24 hr capsule, Take 1 capsule (37.5 mg total) by mouth daily with breakfast.   Reviewed prior external information including notes and imaging from  primary care provider As well as notes that were available from care everywhere and other healthcare systems.  Past medical history, social, surgical and family history all reviewed in electronic medical record.  No pertanent information unless stated regarding to the chief complaint.   Review of Systems:  No headache, visual changes, nausea, vomiting, diarrhea, constipation, dizziness, abdominal pain, skin rash, fevers, chills, night sweats, weight loss, swollen lymph nodes, body aches, joint swelling, chest pain, shortness of breath, mood changes. POSITIVE muscle aches  Objective  There were no vitals taken for this visit.   General: No apparent distress alert and oriented x3 mood and affect normal, dressed appropriately.  HEENT: Pupils equal, extraocular movements intact  Respiratory: Patient's speak in full sentences and does not appear short of breath  Cardiovascular: No lower extremity edema, non tender, no erythema  Neuro: Cranial nerves II through XII are intact, neurovascularly intact in all extremities with 2+ DTRs and 2+ pulses.  Gait normal with good balance and coordination.  MSK:  Non tender with full range of motion and good stability and symmetric strength and tone of shoulders, elbows, wrist, hip, knee and ankles bilaterally.     Impression and Recommendations:      The above documentation has been reviewed and is accurate and complete Marland Kitchen, DO       Note: This dictation was prepared with Dragon dictation along with smaller phrase technology. Any transcriptional errors that result from this process are unintentional.

## 2019-11-08 ENCOUNTER — Other Ambulatory Visit: Payer: Self-pay | Admitting: Family Medicine

## 2019-11-20 ENCOUNTER — Emergency Department (HOSPITAL_COMMUNITY): Payer: BC Managed Care – PPO

## 2019-11-20 ENCOUNTER — Other Ambulatory Visit: Payer: Self-pay

## 2019-11-20 ENCOUNTER — Encounter (HOSPITAL_COMMUNITY): Payer: Self-pay

## 2019-11-20 DIAGNOSIS — N200 Calculus of kidney: Secondary | ICD-10-CM | POA: Insufficient documentation

## 2019-11-20 DIAGNOSIS — R110 Nausea: Secondary | ICD-10-CM | POA: Insufficient documentation

## 2019-11-20 DIAGNOSIS — Z87891 Personal history of nicotine dependence: Secondary | ICD-10-CM | POA: Insufficient documentation

## 2019-11-20 DIAGNOSIS — R1012 Left upper quadrant pain: Secondary | ICD-10-CM | POA: Diagnosis not present

## 2019-11-20 DIAGNOSIS — K760 Fatty (change of) liver, not elsewhere classified: Secondary | ICD-10-CM | POA: Diagnosis not present

## 2019-11-20 DIAGNOSIS — M25561 Pain in right knee: Secondary | ICD-10-CM | POA: Insufficient documentation

## 2019-11-20 DIAGNOSIS — Z79899 Other long term (current) drug therapy: Secondary | ICD-10-CM | POA: Diagnosis not present

## 2019-11-20 DIAGNOSIS — R109 Unspecified abdominal pain: Secondary | ICD-10-CM | POA: Diagnosis not present

## 2019-11-20 LAB — COMPREHENSIVE METABOLIC PANEL
ALT: 23 U/L (ref 0–44)
AST: 17 U/L (ref 15–41)
Albumin: 4 g/dL (ref 3.5–5.0)
Alkaline Phosphatase: 66 U/L (ref 38–126)
Anion gap: 13 (ref 5–15)
BUN: 11 mg/dL (ref 6–20)
CO2: 24 mmol/L (ref 22–32)
Calcium: 9.1 mg/dL (ref 8.9–10.3)
Chloride: 103 mmol/L (ref 98–111)
Creatinine, Ser: 1.1 mg/dL (ref 0.61–1.24)
GFR calc Af Amer: >60 mL/min (ref >60)
GFR calc non Af Amer: >60 mL/min (ref >60)
Glucose, Bld: 95 mg/dL (ref 70–99)
Potassium: 3.9 mmol/L (ref 3.5–5.1)
Sodium: 140 mmol/L (ref 135–145)
Total Bilirubin: 0.4 mg/dL (ref 0.3–1.2)
Total Protein: 7.8 g/dL (ref 6.5–8.1)

## 2019-11-20 LAB — CBC
HCT: 47.5 % (ref 39.0–52.0)
Hemoglobin: 16 g/dL (ref 13.0–17.0)
MCH: 29.3 pg (ref 26.0–34.0)
MCHC: 33.7 g/dL (ref 30.0–36.0)
MCV: 86.8 fL (ref 80.0–100.0)
Platelets: 345 10*3/uL (ref 150–400)
RBC: 5.47 MIL/uL (ref 4.22–5.81)
RDW: 13.2 % (ref 11.5–15.5)
WBC: 12 10*3/uL — ABNORMAL HIGH (ref 4.0–10.5)
nRBC: 0 % (ref 0.0–0.2)

## 2019-11-20 LAB — LIPASE, BLOOD: Lipase: 22 U/L (ref 11–51)

## 2019-11-20 MED ORDER — SODIUM CHLORIDE 0.9% FLUSH: 3.0000 mL | Freq: Once | INTRAVENOUS | Status: DC

## 2019-11-20 NOTE — ED Triage Notes (Signed)
Pt describes LLQ abdominal pain and sts nausea after eating food x 2 weeks. Also sts nontraumatic right knee pain where he is unable to put weight on.

## 2019-11-21 ENCOUNTER — Encounter: Payer: Self-pay | Admitting: Family Medicine

## 2019-11-21 ENCOUNTER — Emergency Department (HOSPITAL_COMMUNITY): Payer: BC Managed Care – PPO

## 2019-11-21 ENCOUNTER — Emergency Department (HOSPITAL_COMMUNITY)
Admission: EM | Admit: 2019-11-21 | Discharge: 2019-11-21 | Disposition: A | Discharge status: Home / Self Care | Payer: BC Managed Care – PPO | Attending: Emergency Medicine | Admitting: Emergency Medicine

## 2019-11-21 DIAGNOSIS — R110 Nausea: Secondary | ICD-10-CM

## 2019-11-21 DIAGNOSIS — R109 Unspecified abdominal pain: Secondary | ICD-10-CM

## 2019-11-21 DIAGNOSIS — M25561 Pain in right knee: Secondary | ICD-10-CM

## 2019-11-21 DIAGNOSIS — N200 Calculus of kidney: Secondary | ICD-10-CM

## 2019-11-21 DIAGNOSIS — K760 Fatty (change of) liver, not elsewhere classified: Secondary | ICD-10-CM | POA: Diagnosis not present

## 2019-11-21 LAB — URINALYSIS, ROUTINE W REFLEX MICROSCOPIC
Bacteria, UA: NONE SEEN
Bilirubin Urine: NEGATIVE
Glucose, UA: NEGATIVE mg/dL
Ketones, ur: NEGATIVE mg/dL
Leukocytes,Ua: NEGATIVE
Nitrite: NEGATIVE
Protein, ur: NEGATIVE mg/dL
Specific Gravity, Urine: 1.013 (ref 1.005–1.030)
pH: 5 (ref 5.0–8.0)

## 2019-11-21 MED ORDER — FAMOTIDINE IN NACL 20-0.9 MG/50ML-% IV SOLN
20.0000 mg | INTRAVENOUS | Status: AC
  Administered 2019-11-21: 20 mg via INTRAVENOUS
  Filled 2019-11-21: qty 50

## 2019-11-21 MED ORDER — SUCRALFATE 1 G PO TABS
1.0000 g | ORAL_TABLET | Freq: Three times a day (TID) | ORAL | 0 refills | Status: DC
Start: 2019-11-21 — End: 2020-06-15

## 2019-11-21 MED ORDER — ALUM & MAG HYDROXIDE-SIMETH 200-200-20 MG/5ML PO SUSP
30.0000 mL | Freq: Once | ORAL | Status: AC
  Administered 2019-11-21: 30 mL via ORAL
  Filled 2019-11-21: qty 30

## 2019-11-21 MED ORDER — OMEPRAZOLE 20 MG PO CPDR: 20.0000 mg | DELAYED_RELEASE_CAPSULE | Freq: Every day | ORAL | 0 refills | Status: DC

## 2019-11-21 MED ORDER — SODIUM CHLORIDE 0.9 % IV BOLUS
1000.0000 mL | Freq: Once | INTRAVENOUS | Status: AC
  Administered 2019-11-21: 1000 mL via INTRAVENOUS

## 2019-11-21 MED ORDER — LIDOCAINE VISCOUS HCL 2 % MT SOLN
15.0000 mL | Freq: Once | OROMUCOSAL | Status: AC
  Administered 2019-11-21: 15 mL via ORAL
  Filled 2019-11-21: qty 15

## 2019-11-21 NOTE — ED Provider Notes (Signed)
[facility]-EMERGENCY DEPT Provider Note   CSN: [id number] Arrival date & time: 11/20/19  2152     History Chief Complaint  Patient presents with  . Abdominal Pain  . Knee Pain    Jacob Nixon. is a 29 y.o. male.  Patient presents to the emergency department with a chief complaint of 2 complaints. 1.  Abdominal pain and nausea: Patient reports that he has been having the symptoms for the past 2 weeks.  He states the pain is mostly located on his left side.  States that it is worsened with eating.  States that it is unlike prior stomach ulcers.  He also states that it does not feel like kidney stones either.  He does have a history of kidney stones.  Denies any successful treatments prior to arrival.  2.  Knee pain: Patient complains of right knee pain.  Denies any injury.  States that he has been using a brace for comfort.  He denies any fever chills.  Denies any redness or swelling.  Denies any successful treatments prior to arrival.  The history is provided by the patient. No language interpreter was used.       Past Medical History:  Diagnosis Date  . Bronchitis   . Cellulitis of right hand - RECURRENT   . Kidney stones     Patient Active Problem List   Diagnosis Date Noted  . Left anterior knee pain 10/13/2019  . Cervical disc disorder with radiculopathy of cervical region 08/23/2019  . Low back pain 08/23/2019  . Nonallopathic lesion of lumbosacral region 08/23/2019  . Nonallopathic lesion of sacral region 08/23/2019  . Nonallopathic lesion of thoracic region 08/23/2019  . Labral tear of shoulder 12/15/2018  . Hyponatremia 10/26/2016  . Hypokalemia 10/26/2016  . Elevated glucose level   . Cellulitis of hand 08/04/2016  . Carpal tunnel syndrome on right 05/08/2016  . Abscess of upper arm   . Cellulitis 01/22/2016  . Leukocytosis 01/22/2016  . Neuropathy 01/22/2016  . Nephrolithiasis 01/22/2016  . Cellulitis of right hand   . Cellulitis  of forearm, right 07/30/2015    Past Surgical History:  Procedure Laterality Date  . CARPAL TUNNEL RELEASE Left 04/09/2016   Procedure: CARPAL TUNNEL RELEASE;  Surgeon: Cammy Copa, MD;  Location: [facility] OR;  Service: Orthopedics;  Laterality: Left;  . HAND SURGERY    . KIDNEY STONE SURGERY     lithortripsy       Family History  Problem Relation Age of Onset  . Hypertension Other   . Diabetes Father     Social History   Tobacco Use  . Smoking status: Former Games developer  . Smokeless tobacco: Never Used  Vaping Use  . Vaping Use: Every day  . Substances: Nicotine, Flavoring  Substance Use Topics  . Alcohol use: Yes    Comment: 2-3 times a month   . Drug use: No    Home Medications Prior to Admission medications   Medication Sig Start Date End Date Taking? Authorizing Provider  acetaZOLAMIDE (DIAMOX) 125 MG tablet Take 1 tablet (125 mg total) by mouth 2 (two) times daily. 06/02/19  Yes Judi Saa, DO  ibuprofen (ADVIL) 800 MG tablet Take 1 tablet (800 mg total) by mouth 3 (three) times daily. 10/17/19  Yes Wieters, Hallie C, PA-C  meloxicam (MOBIC) 15 MG tablet TAKE 1 TABLET BY MOUTH EVERY DAY 11/08/19  Yes Antoine Primas M, DO  montelukast (SINGULAIR) 10 MG tablet Take 1 tablet (  10 mg total) by mouth at bedtime. 12/15/18  Yes Judi Saa, DO  nitroGLYCERIN (NITRODUR - DOSED IN MG/24 HR) 0.2 mg/hr patch 1/4 patch daily 12/15/18  Yes Antoine Primas M, DO  traZODone (DESYREL) 50 MG tablet Take 0.5-1 tablets (25-50 mg total) by mouth at bedtime as needed for sleep. 06/02/19  Yes Judi Saa, DO  omeprazole (PRILOSEC) 20 MG capsule Take 1 capsule (20 mg total) by mouth daily. 11/21/19   Roxy Horseman, PA-C  sucralfate (CARAFATE) 1 g tablet Take 1 tablet (1 g total) by mouth 4 (four) times daily -  with meals and at bedtime. 11/21/19   Roxy Horseman, PA-C    Allergies    Coconut flavor, Fentanyl, and Penicillins  Review of Systems   Review of Systems  All other  systems reviewed and are negative.   Physical Exam Updated Vital Signs BP 121/88 (BP Location: Right Arm)   Pulse 87   Temp 97.7 F (36.5 C) (Oral)   Resp 17   Ht 5\' 11"  (1.803 m)   Wt 122.5 kg   SpO2 97%   BMI 37.66 kg/m   Physical Exam Vitals and nursing note reviewed.  Constitutional:      Appearance: He is well-developed.  HENT:     Head: Normocephalic and atraumatic.  Eyes:     Conjunctiva/sclera: Conjunctivae normal.  Cardiovascular:     Rate and Rhythm: Normal rate and regular rhythm.     Heart sounds: No murmur heard.   Pulmonary:     Effort: Pulmonary effort is normal. No respiratory distress.     Breath sounds: Normal breath sounds.  Abdominal:     Palpations: Abdomen is soft.     Tenderness: There is no abdominal tenderness.     Comments: Left upper abdominal discomfort, but without focal tenderness  Musculoskeletal:     Cervical back: Neck supple.     Comments: Right knee without any bony abnormality or deformity, range of motion and strength slightly limited secondary to pain  Skin:    General: Skin is warm and dry.     Comments: No erythema about the right knee, no evidence of cellulitis or infection  Neurological:     Mental Status: He is alert and oriented to person, place, and time.  Psychiatric:        Mood and Affect: Mood normal.        Behavior: Behavior normal.     ED Results / Procedures / Treatments   Labs (all labs ordered are listed, but only abnormal results are displayed) Labs Reviewed  CBC - Abnormal; Notable for the following components:      Result Value   WBC 12.0 (*)    All other components within normal limits  URINALYSIS, ROUTINE W REFLEX MICROSCOPIC - Abnormal; Notable for the following components:   Hgb urine dipstick SMALL (*)    All other components within normal limits  LIPASE, BLOOD  COMPREHENSIVE METABOLIC PANEL    EKG None  Radiology DG Knee 2 Views Right  Result Date: 11/20/2019 CLINICAL DATA:  Atraumatic  right knee pain. EXAM: RIGHT KNEE - 1-2 VIEW COMPARISON:  None. FINDINGS: No evidence of fracture, dislocation, or joint effusion. No evidence of arthropathy or other focal bone abnormality. Soft tissues are unremarkable. IMPRESSION: Negative. Electronically Signed   By: 01/21/2020 M.D.   On: 11/20/2019 22:31   CT Renal Stone Study  Result Date: 11/21/2019 CLINICAL DATA:  29 year old male with history of left lower quadrant  abdominal pain and nausea for the past 2 weeks. Suspected kidney stone. EXAM: CT ABDOMEN AND PELVIS WITHOUT CONTRAST TECHNIQUE: Multidetector CT imaging of the abdomen and pelvis was performed following the standard protocol without IV contrast. COMPARISON:  CT the abdomen and pelvis 10/19/2018. FINDINGS: Lower chest: Unremarkable. Hepatobiliary: Diffuse low attenuation throughout the hepatic parenchyma, indicative of hepatic steatosis. No discrete cystic or solid hepatic lesions are confidently identified on today's noncontrast CT examination. Unenhanced appearance of the gallbladder is normal. Pancreas: No definite pancreatic mass or peripancreatic fluid collections or inflammatory changes are noted on today's noncontrast CT examination. Spleen: Unremarkable. Adrenals/Urinary Tract: Multiple nonobstructive calculi are present within the renal collecting systems bilaterally, largest of which measures 5 mm in the upper pole collecting system of the left kidney. No additional calculi are noted along the course of either ureter or within the lumen of the urinary bladder. No hydroureteronephrosis. Unenhanced appearance of the urinary bladder is normal. Bilateral adrenal glands are normal in appearance. Stomach/Bowel: The unenhanced appearance of the stomach is normal. No pathologic dilatation of small bowel or colon. Normal appendix. Vascular/Lymphatic: No atherosclerotic calcifications noted in the abdominal aorta or pelvic vasculature. No lymphadenopathy noted in the abdomen or pelvis.  Reproductive: Prostate gland and seminal vesicles are unremarkable in appearance. Other: No significant volume of ascites.  No pneumoperitoneum. Musculoskeletal: There are no aggressive appearing lytic or blastic lesions noted in the visualized portions of the skeleton. IMPRESSION: 1. Multiple nonobstructive calculi measuring 5 mm or less in size in the collecting systems of both kidneys. No ureteral stones or findings of urinary tract obstruction are noted at this time. 2. No other acute findings are noted elsewhere in the abdomen or pelvis to account for the patient's symptoms. 3. Hepatic steatosis. Electronically Signed   By: Trudie Reed M.D.   On: 11/21/2019 06:17    Procedures Procedures (including critical care time)  Medications Ordered in ED Medications  sodium chloride flush (NS) 0.9 % injection 3 mL (3 mLs Intravenous Not Given 11/20/19 2241)  sodium chloride 0.9 % bolus 1,000 mL (0 mLs Intravenous Stopped 11/21/19 0444)  famotidine (PEPCID) IVPB 20 mg premix (0 mg Intravenous Stopped 11/21/19 0225)  alum & mag hydroxide-simeth (MAALOX/MYLANTA) 200-200-20 MG/5ML suspension 30 mL (30 mLs Oral Given 11/21/19 0155)    And  lidocaine (XYLOCAINE) 2 % viscous mouth solution 15 mL (15 mLs Oral Given 11/21/19 0155)    ED Course  I have reviewed the triage vital signs and the nursing notes.  Pertinent labs & imaging results that were available during my care of the patient were reviewed by me and considered in my medical decision making (see chart for details).    MDM Rules/Calculators/A&P                          Patient here with complaints of abdominal pain and knee pain.  Knee pain has been ongoing for a few days.  He has been using a brace.  I doubt septic joint.  He is able to move his knee.  It is not red or hot.  He denies any injury.  Plain films are negative.  Will plan for discharge and orthopedic follow-up.  Regarding patient's abdominal pain, he has been having the symptoms for  the past 2 weeks.  He does have history of kidney stones, but states that this does not feel like his kidney stones.  He does have a small amount of hemoglobin in his  urine.  Will check CT abdomen/pelvis.  CT shows several stones within his kidneys, but no ureterolithiasis.  Given that his symptoms worsen after he eats, peptic ulcer is also on the differential.  I will treat him with omeprazole and Carafate.  Recommend close outpatient follow-up.  Patient understands and agrees the plan.  No further emergent work-up indicated tonight.  Vital signs are stable.  Patient is in no acute distress.  Final Clinical Impression(s) / ED Diagnoses Final diagnoses:  Flank pain  Nausea  Acute pain of right knee  Kidney stones    Rx / DC Orders ED Discharge Orders         Ordered    omeprazole (PRILOSEC) 20 MG capsule  Daily     Discontinue  Reprint     11/21/19 0630    sucralfate (CARAFATE) 1 g tablet  3 times daily with meals & bedtime     Discontinue  Reprint     11/21/19 0630           Roxy HorsemanBrowning, Roshanna Cimino, PA-C 11/21/19 [id number]    Palumbo, April, MD 11/21/19 [phone]

## 2019-11-21 NOTE — Discharge Instructions (Signed)
Your CT scan showed several kidney stones within your kidneys.  These typically do not cause pain.  I am suspicious that your pain is related to possible stomach ulcer.  Have prescribed medications to help with this.  Please follow-up with your doctor.  Return to the emergency department for new or worsening symptoms.  Regarding your knee pain, please follow-up with the orthopedic group listed.

## 2019-11-21 NOTE — ED Notes (Signed)
Pt aware of urine sample. Urinal in hand 

## 2019-11-24 ENCOUNTER — Other Ambulatory Visit: Payer: Self-pay

## 2019-11-24 ENCOUNTER — Ambulatory Visit: Payer: Self-pay

## 2019-11-24 ENCOUNTER — Ambulatory Visit (INDEPENDENT_AMBULATORY_CARE_PROVIDER_SITE_OTHER): Payer: BC Managed Care – PPO | Admitting: Family Medicine

## 2019-11-24 ENCOUNTER — Encounter: Payer: Self-pay | Admitting: Family Medicine

## 2019-11-24 VITALS — BP 102/74 | HR 93 | Ht 71.0 in | Wt 270.2 lb

## 2019-11-24 DIAGNOSIS — M25561 Pain in right knee: Secondary | ICD-10-CM

## 2019-11-24 DIAGNOSIS — M62838 Other muscle spasm: Secondary | ICD-10-CM

## 2019-11-24 DIAGNOSIS — S83411A Sprain of medial collateral ligament of right knee, initial encounter: Secondary | ICD-10-CM | POA: Diagnosis not present

## 2019-11-24 NOTE — Patient Instructions (Signed)
Thank you for coming in today. Use the brace.  Recheck in 2-4 weeks.  Call or go to the ER if you develop a large red swollen joint with extreme pain or oozing puss.  Let me know if you have chest pain or shortness of breath.    Medial Collateral Knee Ligament Sprain, Phase I Rehab Ask your health care provider which exercises are safe for you. Do exercises exactly as told by your health care provider and adjust them as directed. It is normal to feel mild stretching, pulling, tightness, or discomfort as you do these exercises. Stop right away if you feel sudden pain or your pain gets worse. Do not begin these exercises until told by your health care provider. Stretching and range-of-motion exercises These exercises warm up your muscles and joints and improve the movement and flexibility of your knee. These exercises also help to relieve pain. Knee flexion, passive 1. Start this exercise in one of these positions: ? Lying on the floor in front of an open doorway, with your left / right heel and foot lightly touching the wall. ? Lying on the floor with both feet on the wall. 2. Without using any effort (passive), allow gravity to let your foot slide down the wall slowly (flexion)until you feel a gentle stretch in the front of your left / right knee. 3. Hold this stretch for __________ seconds. 4. Return your leg to the starting position, using your healthy leg to do the work or to help if needed. Repeat __________ times. Complete this exercise __________ times a day. Knee flexion, active  1. Lie on your back with both legs straight. If this causes back discomfort, bend your healthy knee so your foot is flat on the floor. 2. With your own effort (active), slowly slide your left / right heel back toward your buttocks (flexion). Stop when you feel a gentle stretch in the front of your knee or thigh. 3. Hold this position for __________ seconds. 4. Slowly slide your left / right heel back to the  starting position. Repeat __________ times. Complete this exercise __________ times a day. Knee extension, sitting 1. Sit with your left / right heel propped on a chair, a coffee table, or a footstool. Do not have anything under your knee to support it. 2. Allow your leg muscles to relax, letting gravity straighten out your knee (extension). Do not let your knee roll inward. You should feel a stretch behind your left / right knee. 3. If told by your health care provider, deepen the stretch by placing a __________ lb weight on your thigh, just above your kneecap. 4. Hold this position for __________ seconds. Repeat __________ times. Complete this exercise __________ times a day. Strengthening exercises These exercises build strength and endurance in your knee. Endurance is the ability to use your muscles for a long time, even after they get tired. Isometric exercises involve squeezing your muscles without moving your knee joint. Quadriceps, isometric  1. Lie on your back with your left / right leg extended and your other knee bent. If told by your health care provider, put a rolled towel or small pillow under your left / right knee. 2. Slowly tense the muscles in the front of your left / right thigh (quadriceps). You should see your kneecap slide up toward your hip or see increased dimpling just above the knee. This motion will push the back of your knee toward the floor. 3. For __________ seconds, hold the muscle as  tight as you can without increasing your pain. 4. Relax your muscles slowly and completely. Repeat __________ times. Complete this exercise __________ times a day. Hamstring, isometric  1. Lie on your back on a firm surface. 2. Bend your left / right knee about __________ degrees. You can prop your knee on a pillow if needed. 3. Dig your left / right heel down and back into the surface as if you are trying to pull your heel toward your buttocks. Tighten the muscles in the back of  your thighs (hamstring) to "dig" as hard as you can without increasing any pain. 4. Hold this position for __________ seconds. 5. Relax your muscles slowly and completely. Repeat __________ times. Complete this exercise __________ times a day. This information is not intended to replace advice given to you by your health care provider. Make sure you discuss any questions you have with your health care provider. Document Revised: 08/28/2018 Document Reviewed: 02/25/2018 Elsevier Patient Education  2020 [employer].

## 2019-11-24 NOTE — Progress Notes (Signed)
I, Jacob Nixon, LAT, ATC, am serving as scribe for Dr. Clementeen Graham.  Jacob Nixon. is a 29 y.o. male who presents to [facility] today for R knee pain.  He was last seen by Dr. Katrinka Blazing on 10/13/19 for f/u of knee pain after he fell and landed on his L knee in late May 2021.  He was initially seen at the [facility] ED on 10/12/19 and then by Dr. Katrinka Blazing on 10/13/19.  He was provide w/ a hinged knee brace and prescribed Meloxicam.  Since his last visit, pt reports new R knee pain x couple weeks after falling down some steps, twisting his R knee.  He went to the [facility] ED on 11/21/19 regarding his knee.  He locates his pain to his R medial knee.  He has been icing his knee and is wearing a hinged knee brace that he originally got for his L knee.  He has been taking some leftover medicines including Flexeril, Meloxicam and Tramadol.  Pt also mentions chronic L shoulder pain that he has had injections in in the past.  He asks if he could possibly get another injection in his L shoulder today.  Pain is mostly located at the trapezius.  He has had a history of labrum injury to the shoulder with glenohumeral injections as well as trigger point injections into the trapezius on the past.  Diagnostic imaging: R knee XR- 11/20/19; L knee XR- 10/12/19   Pertinent review of systems: No fevers or chills  Relevant historical information: Labrum tear shoulder.   Exam:  BP 102/74 (BP Location: Right Arm, Patient Position: Sitting, Cuff Size: Large)   Pulse 93   Ht 5\' 11"  (1.803 m)   Wt 270 lb 3.2 oz (122.6 kg)   SpO2 97%   BMI 37.69 kg/m  General: Well Developed, well nourished, and in no acute distress.   MSK: C-spine normal-appearing nontender midline normal cervical motion.  Tender palpation left trapezius. Decreased shoulder range of motion to abduction left shoulder.  Right knee mild effusion.  Tender palpation at medial joint line. Guarding with ligament exam  testing.  Pain with MCL stress test without significant laxity. Too much guarding for diagnostic McMurray's testing. Strength intact.    Lab and Radiology Results  EXAM: RIGHT KNEE - 1-2 VIEW  COMPARISON:  None.  FINDINGS: No evidence of fracture, dislocation, or joint effusion. No evidence of arthropathy or other focal bone abnormality. Soft tissues are unremarkable.  IMPRESSION: Negative.   Electronically Signed   By: M.D.   On: 11/20/2019 22:31 I, 01/21/2020, personally (independently) visualized and performed the interpretation of the images attached in this note.    Diagnostic Limited MSK Ultrasound of: Right knee Quad tendon intact normal-appearing Small joint effusion superior patellar space. Patellar tendon normal-appearing Lateral joint line normal.. Medial joint line slightly narrowed with somewhat degenerative.  Medial meniscus.  MCL appears to be intact however hypoechoic fluid tracking at medial femoral condyle insertion of MCL. Impression: MCL strain without full-thickness tear.  Possible medial meniscus tear.   Procedure: Real-time Ultrasound Guided Injection of right knee lateral superior patellar space Device: Philips Affiniti 50G Images permanently stored and available for review in the ultrasound unit. Verbal informed consent obtained.  Discussed risks and benefits of procedure. Warned about infection bleeding damage to structures skin hypopigmentation and fat atrophy among others. Patient expresses understanding and agreement Time-out conducted.   Noted no overlying erythema, induration,  or other signs of local infection.   Skin prepped in a sterile fashion.   Local anesthesia: Topical Ethyl chloride.   With sterile technique and under real time ultrasound guidance:  40 mg of Kenalog and 2 mL of Marcaine injected easily.   Completed without difficulty   Pain partially immediately resolved suggesting accurate placement of the  medication.   Advised to call if fevers/chills, erythema, induration, drainage, or persistent bleeding.   Images permanently stored and available for review in the ultrasound unit.  Impression: Technically successful ultrasound guided injection.   Procedure: Real-time Ultrasound Guided trigger point injection of left trapezius Device: Philips Affiniti 50G Images permanently stored and available for review in the ultrasound unit. Verbal informed consent obtained.  Discussed risks and benefits of procedure. Warned about infection bleeding damage to structures skin hypopigmentation and fat atrophy, pneumothorax among others. Patient expresses understanding and agreement Time-out conducted.   Noted no overlying erythema, induration, or other signs of local infection.   Skin prepped in a sterile fashion.   Local anesthesia: Topical Ethyl chloride.   With sterile technique and under real time ultrasound guidance:  40 mg of Kenalog and 1 mL of Marcaine injected easily.   Completed without difficulty   Needle was visualized during the entirety of the injection at no point tenderness approach the rib margins. Pain immediately resolved suggesting accurate placement of the medication.   Advised to call if fevers/chills, erythema, induration, drainage, or persistent bleeding.   Images permanently stored and available for review in the ultrasound unit.  Impression: Technically successful ultrasound guided injection.       Assessment and Plan: 29 y.o. male with right knee pain after fall.  Concerning for MCL strain versus medial meniscus tear or both.  Plan for intra-articular steroid injection and hinged knee brace.  Recheck back in 2 to 4 weeks.  May consider physical therapy or MRI at that time if needed.  Left trapezius strain.  Done well with trigger point injections in the past.  Plan for trigger point injection today.  Specifically discussed pneumothorax risk in addition to typical injection  risk as well.  Patient had great relief immediately following injection.  Hopefully will have some more lasting relief.  Therapy would be helpful for this issue in the future if needed as well.    Orders Placed This Encounter  Procedures  . [place] LIMITED JOINT SPACE STRUCTURES LOW RIGHT(NO LINKED CHARGES)    Order Specific Question:   Reason for Exam (SYMPTOM  OR DIAGNOSIS REQUIRED)    Answer:   R knee pain    Order Specific Question:   Preferred imaging location?    Answer:   [facility]   No orders of the defined types were placed in this encounter.    Discussed warning signs or symptoms. Please see discharge instructions. Patient expresses understanding.   The above documentation has been reviewed and is accurate and complete Clementeen Graham, M.D.

## 2019-12-22 ENCOUNTER — Ambulatory Visit (INDEPENDENT_AMBULATORY_CARE_PROVIDER_SITE_OTHER): Payer: BC Managed Care – PPO | Admitting: Family Medicine

## 2019-12-22 ENCOUNTER — Encounter: Payer: Self-pay | Admitting: Family Medicine

## 2019-12-22 ENCOUNTER — Other Ambulatory Visit: Payer: Self-pay

## 2019-12-22 VITALS — BP 114/82 | HR 93 | Ht 71.0 in | Wt 268.0 lb

## 2019-12-22 DIAGNOSIS — M5442 Lumbago with sciatica, left side: Secondary | ICD-10-CM | POA: Diagnosis not present

## 2019-12-22 DIAGNOSIS — M62838 Other muscle spasm: Secondary | ICD-10-CM | POA: Diagnosis not present

## 2019-12-22 MED ORDER — GABAPENTIN 300 MG PO CAPS
300.0000 mg | ORAL_CAPSULE | Freq: Three times a day (TID) | ORAL | 3 refills | Status: DC | PRN
Start: 2019-12-22 — End: 2020-06-15

## 2019-12-22 MED ORDER — PREDNISONE 50 MG PO TABS
50.0000 mg | ORAL_TABLET | Freq: Every day | ORAL | 0 refills | Status: DC
Start: 2019-12-22 — End: 2020-06-15

## 2019-12-22 NOTE — Progress Notes (Signed)
I, [name], LAT, ATC, am serving as scribe for Dr. Clementeen Graham.  Jacob Nixon. is a 29 y.o. male who presents to [facility] at [facility] today for f/u of R knee pain.  He was last seen by Dr. Denyse Amass on 11/24/19 and had a R knee injection and was advised to use a knee sleeve/brace.  He also had a trigger point injection in his L upper trap.  Since his last visit, pt reports that his R knee is feeling better and reports that he is no longer wearing his brace.  He states that his biggest c/o is now R-sided neck pain and low back pain w/ radiating pain into his L leg.  He also reports tingling in his L toes.  Diagnostic imaging: R knee XR- 11/20/19  Pertinent review of systems: No fevers or chills  Relevant historical information: History epidural steroid injection C-spine in April helping neck pain previously.   Exam:  BP 114/82 (BP Location: Right Arm, Patient Position: Sitting, Cuff Size: Large)   Pulse 93   Ht 5\' 11"  (1.803 m)   Wt 268 lb (121.6 kg)   SpO2 98%   BMI 37.38 kg/m  General: Well Developed, well nourished, and in no acute distress.   MSK: C-spine normal-appearing Nontender midline. Normal cervical motion. Tender palpation right cervical paraspinal musculature and left trapezius. Impression restrained reflexes and sensation are intact and equal normally bilaterally.  L-spine normal-appearing nontender midline.  Tender palpation bilateral lumbar sacral paraspinal musculature. Decreased lumbar motion. Extremity strength reflexes and sensation are equal and normal bilaterally. Positive left-sided slump test.    Lab and Radiology Results Images of L-spine and sacrum from CT scan abdomen and pelvis July 2021 personally and independently reviewed. Mild DDD L5-S1    Assessment and Plan: 29 y.o. male with new acute low back pain with radiating pain down the posterior thigh to the knee left leg.  Likely lumbosacral strain and spasm.  Patient may  have a sciatica component as well or could simply be referred pain.  This should be well treated with prednisone gabapentin physical therapy.  Recommend also heating pad and TENS unit.  Offered work note patient declined.  Cervical paraspinal muscular strain and trapezius strain.  Continuation of ongoing issue.  Patient had some benefit in April with epidural steroid injection.  Today's pain seems to be more muscle related and less likely to be beneficial with epidural steroid injection.  We will try physical therapy and the above treatment.  If not improving patient will notify me and I will order epidural steroid injection.    Orders Placed This Encounter  Procedures  . Ambulatory referral to Physical Therapy    Referral Priority:   Routine    Referral Type:   Physical Medicine    Referral Reason:   Specialty Services Required    Requested Specialty:   Physical Therapy   Meds ordered this encounter  Medications  . predniSONE (DELTASONE) 50 MG tablet    Sig: Take 1 tablet (50 mg total) by mouth daily.    Dispense:  5 tablet    Refill:  0  . gabapentin (NEURONTIN) 300 MG capsule    Sig: Take 1 capsule (300 mg total) by mouth 3 (three) times daily as needed (nerve pain).    Dispense:  90 capsule    Refill:  3     Discussed warning signs or symptoms. Please see discharge instructions. Patient expresses understanding.   The above  documentation has been reviewed and is accurate and complete Lynne Leader, M.D.

## 2019-12-22 NOTE — Patient Instructions (Signed)
Thank you for coming in today.  Plan for PT and prednisone and gabapentin.  Use heat and TENS unit.   If neck pain is not improving can proceed to neck injection.   TENS UNIT: This is helpful for muscle pain and spasm.   Search and Purchase a TENS 7000 2nd edition at  www.tenspros.com or www.Amazon.com It should be less than $30.     TENS unit instructions: Do not shower or bathe with the unit on Turn the unit off before removing electrodes or batteries If the electrodes lose stickiness add a drop of water to the electrodes after they are disconnected from the unit and place on plastic sheet. If you continued to have difficulty, call the TENS unit company to purchase more electrodes. Do not apply lotion on the skin area prior to use. Make sure the skin is clean and dry as this will help prolong the life of the electrodes. After use, always check skin for unusual red areas, rash or other skin difficulties. If there are any skin problems, does not apply electrodes to the same area. Never remove the electrodes from the unit by pulling the wires. Do not use the TENS unit or electrodes other than as directed. Do not change electrode placement without consultating your therapist or physician. Keep 2 fingers with between each electrode. Wear time ratio is 2:1, on to off times.    For example on for 30 minutes off for 15 minutes and then on for 30 minutes off for 15 minutes     Sciatica  Sciatica is pain, weakness, tingling, or loss of feeling (numbness) along the sciatic nerve. The sciatic nerve starts in the lower back and goes down the back of each leg. Sciatica usually goes away on its own or with treatment. Sometimes, sciatica may come back (recur). What are the causes? This condition happens when the sciatic nerve is pinched or has pressure put on it. This may be the result of:  A disk in between the bones of the spine bulging out too far (herniated disk).  Changes in the spinal  disks that occur with aging.  A condition that affects a muscle in the butt.  Extra bone growth near the sciatic nerve.  A break (fracture) of the area between your hip bones (pelvis).  Pregnancy.  Tumor. This is rare. What increases the risk? You are more likely to develop this condition if you:  Play sports that put pressure or stress on the spine.  Have poor strength and ease of movement (flexibility).  Have had a back injury in the past.  Have had back surgery.  Sit for long periods of time.  Do activities that involve bending or lifting over and over again.  Are very overweight (obese). What are the signs or symptoms? Symptoms can vary from mild to very bad. They may include:  Any of these problems in the lower back, leg, hip, or butt: ? Mild tingling, loss of feeling, or dull aches. ? Burning sensations. ? Sharp pains.  Loss of feeling in the back of the calf or the sole of the foot.  Leg weakness.  Very bad back pain that makes it hard to move. These symptoms may get worse when you cough, sneeze, or laugh. They may also get worse when you sit or stand for long periods of time. How is this treated? This condition often gets better without any treatment. However, treatment may include:  Changing or cutting back on physical activity when  you have pain.  Doing exercises and stretching.  Putting ice or heat on the affected area.  Medicines that help: ? To relieve pain and swelling. ? To relax your muscles.  Shots (injections) of medicines that help to relieve pain, irritation, and swelling.  Surgery. Follow these instructions at home: Medicines  Take over-the-counter and prescription medicines only as told by your doctor.  Ask your doctor if the medicine prescribed to you: ? Requires you to avoid driving or using heavy machinery. ? Can cause trouble pooping (constipation). You may need to take these steps to prevent or treat trouble pooping:  Drink  enough fluids to keep your pee (urine) pale yellow.  Take over-the-counter or prescription medicines.  Eat foods that are high in fiber. These include beans, whole grains, and fresh fruits and vegetables.  Limit foods that are high in fat and sugar. These include fried or sweet foods. Managing pain      If told, put ice on the affected area. ? Put ice in a plastic bag. ? Place a towel between your skin and the bag. ? Leave the ice on for 20 minutes, 2-3 times a day.  If told, put heat on the affected area. Use the heat source that your doctor tells you to use, such as a moist heat pack or a heating pad. ? Place a towel between your skin and the heat source. ? Leave the heat on for 20-30 minutes. ? Remove the heat if your skin turns bright red. This is very important if you are unable to feel pain, heat, or cold. You may have a greater risk of getting burned. Activity   Return to your normal activities as told by your doctor. Ask your doctor what activities are safe for you.  Avoid activities that make your symptoms worse.  Take short rests during the day. ? When you rest for a long time, do some physical activity or stretching between periods of rest. ? Avoid sitting for a long time without moving. Get up and move around at least one time each hour.  Exercise and stretch regularly, as told by your doctor.  Do not lift anything that is heavier than 10 lb (4.5 kg) while you have symptoms of sciatica. ? Avoid lifting heavy things even when you do not have symptoms. ? Avoid lifting heavy things over and over.  When you lift objects, always lift in a way that is safe for your body. To do this, you should: ? Bend your knees. ? Keep the object close to your body. ? Avoid twisting. General instructions  Stay at a healthy weight.  Wear comfortable shoes that support your feet. Avoid wearing high heels.  Avoid sleeping on a mattress that is too soft or too hard. You might have  less pain if you sleep on a mattress that is firm enough to support your back.  Keep all follow-up visits as told by your doctor. This is important. Contact a doctor if:  You have pain that: ? Wakes you up when you are sleeping. ? Gets worse when you lie down. ? Is worse than the pain you have had in the past. ? Lasts longer than 4 weeks.  You lose weight without trying. Get help right away if:  You cannot control when you pee (urinate) or poop (have a bowel movement).  You have weakness in any of these areas and it gets worse: ? Lower back. ? The area between your hip bones. ? Butt. ?  Legs.  You have redness or swelling of your back.  You have a burning feeling when you pee. Summary  Sciatica is pain, weakness, tingling, or loss of feeling (numbness) along the sciatic nerve.  This condition happens when the sciatic nerve is pinched or has pressure put on it.  Sciatica can cause pain, tingling, or loss of feeling (numbness) in the lower back, legs, hips, and butt.  Treatment often includes rest, exercise, medicines, and putting ice or heat on the affected area. This information is not intended to replace advice given to you by your health care provider. Make sure you discuss any questions you have with your health care provider. Document Revised: 05/25/2018 Document Reviewed: 05/25/2018 Elsevier Patient Education  2020 [employer].

## 2019-12-28 ENCOUNTER — Other Ambulatory Visit: Payer: Self-pay

## 2019-12-28 ENCOUNTER — Emergency Department (HOSPITAL_COMMUNITY)
Admission: EM | Admit: 2019-12-28 | Discharge: 2019-12-29 | Disposition: A | Discharge status: Home / Self Care | Payer: BC Managed Care – PPO | Attending: Emergency Medicine | Admitting: Emergency Medicine

## 2019-12-28 ENCOUNTER — Emergency Department (HOSPITAL_COMMUNITY): Payer: BC Managed Care – PPO

## 2019-12-28 ENCOUNTER — Encounter (HOSPITAL_COMMUNITY): Payer: Self-pay

## 2019-12-28 DIAGNOSIS — Z87891 Personal history of nicotine dependence: Secondary | ICD-10-CM | POA: Insufficient documentation

## 2019-12-28 DIAGNOSIS — R109 Unspecified abdominal pain: Secondary | ICD-10-CM | POA: Diagnosis not present

## 2019-12-28 DIAGNOSIS — N12 Tubulo-interstitial nephritis, not specified as acute or chronic: Secondary | ICD-10-CM | POA: Diagnosis not present

## 2019-12-28 DIAGNOSIS — N10 Acute tubulo-interstitial nephritis: Secondary | ICD-10-CM | POA: Insufficient documentation

## 2019-12-28 LAB — URINALYSIS, ROUTINE W REFLEX MICROSCOPIC
Bilirubin Urine: NEGATIVE
Glucose, UA: NEGATIVE mg/dL
Ketones, ur: NEGATIVE mg/dL
Nitrite: NEGATIVE
Protein, ur: NEGATIVE mg/dL
RBC / HPF: >50 RBC/hpf — ABNORMAL HIGH (ref 0–5)
Specific Gravity, Urine: 1.019 (ref 1.005–1.030)
pH: 6 (ref 5.0–8.0)

## 2019-12-28 LAB — BASIC METABOLIC PANEL
Anion gap: 10 (ref 5–15)
BUN: 12 mg/dL (ref 6–20)
CO2: 25 mmol/L (ref 22–32)
Calcium: 9.1 mg/dL (ref 8.9–10.3)
Chloride: 105 mmol/L (ref 98–111)
Creatinine, Ser: 1 mg/dL (ref 0.61–1.24)
GFR calc Af Amer: >60 mL/min (ref >60)
GFR calc non Af Amer: >60 mL/min (ref >60)
Glucose, Bld: 133 mg/dL — ABNORMAL HIGH (ref 70–99)
Potassium: 3.8 mmol/L (ref 3.5–5.1)
Sodium: 140 mmol/L (ref 135–145)

## 2019-12-28 LAB — CBC
HCT: 47.5 % (ref 39.0–52.0)
Hemoglobin: 15.8 g/dL (ref 13.0–17.0)
MCH: 29.6 pg (ref 26.0–34.0)
MCHC: 33.3 g/dL (ref 30.0–36.0)
MCV: 89 fL (ref 80.0–100.0)
Platelets: 315 10*3/uL (ref 150–400)
RBC: 5.34 MIL/uL (ref 4.22–5.81)
RDW: 13.7 % (ref 11.5–15.5)
WBC: 10.6 10*3/uL — ABNORMAL HIGH (ref 4.0–10.5)
nRBC: 0 % (ref 0.0–0.2)

## 2019-12-28 LAB — CK: Total CK: 64 U/L (ref 49–397)

## 2019-12-28 MED ORDER — KETOROLAC TROMETHAMINE 30 MG/ML IJ SOLN
30.0000 mg | Freq: Once | INTRAMUSCULAR | Status: DC
  Filled 2019-12-28: qty 1

## 2019-12-28 MED ORDER — SODIUM CHLORIDE 0.9 % IV BOLUS
1000.0000 mL | Freq: Once | INTRAVENOUS | Status: AC
  Administered 2019-12-28: 1000 mL via INTRAVENOUS

## 2019-12-28 MED ORDER — ONDANSETRON 4 MG PO TBDP
4.0000 mg | ORAL_TABLET | Freq: Once | ORAL | Status: AC
  Administered 2019-12-28: 4 mg via ORAL
  Filled 2019-12-28: qty 1

## 2019-12-28 MED ORDER — OXYCODONE-ACETAMINOPHEN 5-325 MG PO TABS
1.0000 | ORAL_TABLET | ORAL | Status: DC | PRN
  Administered 2019-12-28: 1 via ORAL
  Filled 2019-12-28: qty 1

## 2019-12-28 MED ORDER — KETOROLAC TROMETHAMINE 30 MG/ML IJ SOLN
30.0000 mg | Freq: Once | INTRAMUSCULAR | Status: AC
  Administered 2019-12-28: 30 mg via INTRAVENOUS

## 2019-12-28 MED ORDER — HYDROCODONE-ACETAMINOPHEN 5-325 MG PO TABS
1.0000 | ORAL_TABLET | Freq: Once | ORAL | Status: AC
  Administered 2019-12-28: 1 via ORAL
  Filled 2019-12-28: qty 1

## 2019-12-28 NOTE — ED Triage Notes (Signed)
Paint arrived with complaint of left sided flank pain since Sunday. States urine has been dark. Today began having NV. Hx of kidney stones in the past.

## 2019-12-28 NOTE — ED Provider Notes (Addendum)
[facility]-EMERGENCY DEPT Provider Note   CSN: [id number] Arrival date & time: 12/28/19  6546    History Chief Complaint  Patient presents with   Flank Pain   Jacob Nixon. is a 29 y.o. male with past medical history significant for low back pain, hyponatremia, hypokalemia, kidney stones who presents for evaluation of left-sided flank pain.  Began on Sunday, 3 days PTA.  States his urine has been dark and he has had dysuria.  Has had 3 episodes of NBNB emesis and persistent nausea.  Feels like prior kidney stones.  Denies headache, lightness, dizziness, chest pain, shortness of breath.  His pain does not radiate into his abdomen.  Not worse with movement.  Has had some blood in his urine.  He has been able to fully empty his bladder.  No fever, chills.  No diarrhea or constipation.  Last bowel movement yesterday without melena or bright red blood per rectum.  Has not take anything at home for symptoms.  Denies additional aggravating or relieving factors. No recent injury or trauma.  History obtained from patient and past medical records. No interpretor was used.  HPI     Past Medical History:  Diagnosis Date   Bronchitis    Cellulitis of right hand - RECURRENT    Kidney stones     Patient Active Problem List   Diagnosis Date Noted   Left anterior knee pain 10/13/2019   Cervical disc disorder with radiculopathy of cervical region 08/23/2019   Low back pain 08/23/2019   Nonallopathic lesion of lumbosacral region 08/23/2019   Nonallopathic lesion of sacral region 08/23/2019   Nonallopathic lesion of thoracic region 08/23/2019   Labral tear of shoulder 12/15/2018   Hyponatremia 10/26/2016   Hypokalemia 10/26/2016   Elevated glucose level    Cellulitis of hand 08/04/2016   Carpal tunnel syndrome on right 05/08/2016   Abscess of upper arm    Cellulitis 01/22/2016   Leukocytosis 01/22/2016   Neuropathy 01/22/2016   Nephrolithiasis  01/22/2016   Cellulitis of right hand    Cellulitis of forearm, right 07/30/2015    Past Surgical History:  Procedure Laterality Date   CARPAL TUNNEL RELEASE Left 04/09/2016   Procedure: CARPAL TUNNEL RELEASE;  Surgeon: Cammy Copa, MD;  Location: [facility] OR;  Service: Orthopedics;  Laterality: Left;   HAND SURGERY     KIDNEY STONE SURGERY     lithortripsy       Family History  Problem Relation Age of Onset   Hypertension Other    Diabetes Father     Social History   Tobacco Use   Smoking status: Former Smoker   Smokeless tobacco: Never Used  Building services engineer Use: Every day   Substances: Nicotine, Flavoring  Substance Use Topics   Alcohol use: Yes    Comment: 2-3 times a month    Drug use: No    Home Medications Prior to Admission medications   Medication Sig Start Date End Date Taking? Authorizing Provider  acetaZOLAMIDE (DIAMOX) 125 MG tablet Take 1 tablet (125 mg total) by mouth 2 (two) times daily. 06/02/19   Judi Saa, DO  gabapentin (NEURONTIN) 300 MG capsule Take 1 capsule (300 mg total) by mouth 3 (three) times daily as needed (nerve pain). 12/22/19   Rodolph Bong, MD  HYDROcodone-acetaminophen (NORCO/VICODIN) 5-325 MG tablet Take 1 tablet by mouth every 4 (four) hours as needed. 12/29/19   Laray Rivkin A, PA-C  ibuprofen (ADVIL) 800  MG tablet Take 1 tablet (800 mg total) by mouth 3 (three) times daily. 10/17/19   Wieters, Hallie C, PA-C  montelukast (SINGULAIR) 10 MG tablet Take 1 tablet (10 mg total) by mouth at bedtime. 12/15/18   Judi Saa, DO  omeprazole (PRILOSEC) 20 MG capsule Take 1 capsule (20 mg total) by mouth daily. 11/21/19   Roxy Horseman, PA-C  ondansetron (ZOFRAN ODT) 4 MG disintegrating tablet Take 1 tablet (4 mg total) by mouth every 8 (eight) hours as needed for nausea or vomiting. 12/29/19   Noga Fogg A, PA-C  predniSONE (DELTASONE) 50 MG tablet Take 1 tablet (50 mg total) by mouth daily. 12/22/19   Rodolph Bong, MD  sucralfate (CARAFATE) 1 g tablet Take 1 tablet (1 g total) by mouth 4 (four) times daily -  with meals and at bedtime. 11/21/19   Roxy Horseman, PA-C  sulfamethoxazole-trimethoprim (BACTRIM DS) 800-160 MG tablet Take 1 tablet by mouth 2 (two) times daily for 7 days. 12/29/19 01/05/20  Eliott Amparan A, PA-C  traZODone (DESYREL) 50 MG tablet Take 0.5-1 tablets (25-50 mg total) by mouth at bedtime as needed for sleep. 06/02/19   Judi Saa, DO    Allergies    Coconut flavor, Fentanyl, and Penicillins  Review of Systems   Review of Systems  Constitutional: Negative.   HENT: Negative.   Respiratory: Negative.   Cardiovascular: Negative.   Gastrointestinal: Negative.   Genitourinary: Positive for dysuria, flank pain and hematuria. Negative for decreased urine volume, difficulty urinating, discharge, frequency, penile pain, penile swelling, scrotal swelling, testicular pain and urgency.  Musculoskeletal: Negative for gait problem, neck pain and neck stiffness.  Skin: Negative.   Neurological: Negative.   All other systems reviewed and are negative.   Physical Exam Updated Vital Signs BP (!) 145/92    Pulse 85    Temp 98.4 F (36.9 C)    Resp 18    SpO2 99%   Physical Exam Vitals and nursing note reviewed.  Constitutional:      General: He is not in acute distress.    Appearance: He is well-developed. He is not ill-appearing, toxic-appearing or diaphoretic.  HENT:     Head: Normocephalic and atraumatic.     Nose: Nose normal.     Mouth/Throat:     Mouth: Mucous membranes are moist.  Eyes:     Pupils: Pupils are equal, round, and reactive to light.  Cardiovascular:     Rate and Rhythm: Normal rate and regular rhythm.     Pulses: Normal pulses.          Radial pulses are 2+ on the right side and 2+ on the left side.       Dorsalis pedis pulses are 2+ on the right side and 2+ on the left side.       Posterior tibial pulses are 2+ on the right side and 2+ on the  left side.     Heart sounds: Normal heart sounds.  Pulmonary:     Effort: Pulmonary effort is normal. No respiratory distress.     Breath sounds: Normal breath sounds.     Comments: Speaks in full sentences without difficulty. Clear to auscultation bilaterally.  Chest:     Comments: Equal rise and fall to chest Abdominal:     General: Bowel sounds are normal. There is no distension.     Palpations: Abdomen is soft. There is no mass.     Tenderness: There is no abdominal tenderness.  There is left CVA tenderness. There is no right CVA tenderness, guarding or rebound. Negative signs include Murphy's sign and McBurney's sign.     Hernia: No hernia is present.     Comments: Soft, nontender without rebound or guarding.  Positive CVA tap on left  Musculoskeletal:        General: No swelling, tenderness, deformity or signs of injury. Normal range of motion.     Cervical back: Normal range of motion and neck supple.     Right lower leg: No edema.     Left lower leg: No edema.     Comments: Compartments soft.  Moves all 4 extremities at difficulty.  Denna Haggard' sign negative  Skin:    General: Skin is warm and dry.     Capillary Refill: Capillary refill takes less than 2 seconds.     Comments: No edema, erythema or warmth.  No rashes or lesions  Neurological:     General: No focal deficit present.     Mental Status: He is alert and oriented to person, place, and time.     Cranial Nerves: Cranial nerves are intact.     Sensory: Sensation is intact.     Motor: Motor function is intact.     Coordination: Coordination is intact.     Gait: Gait is intact.     Comments: CN 2 through 12 grossly intact. Tactile temp to extremities    ED Results / Procedures / Treatments   Labs (all labs ordered are listed, but only abnormal results are displayed) Labs Reviewed  URINALYSIS, ROUTINE W REFLEX MICROSCOPIC - Abnormal; Notable for the following components:      Result Value   Hgb urine dipstick LARGE  (*)    Leukocytes,Ua SMALL (*)    RBC / HPF >50 (*)    Bacteria, UA RARE (*)    All other components within normal limits  CBC - Abnormal; Notable for the following components:   WBC 10.6 (*)    All other components within normal limits  BASIC METABOLIC PANEL - Abnormal; Notable for the following components:   Glucose, Bld 133 (*)    All other components within normal limits  URINE CULTURE  CK    EKG None  Radiology CT Renal Stone Study  Result Date: 12/28/2019 CLINICAL DATA:  Left flank pain, nausea, vomiting EXAM: CT ABDOMEN AND PELVIS WITHOUT CONTRAST TECHNIQUE: Multidetector CT imaging of the abdomen and pelvis was performed following the standard protocol without IV contrast. COMPARISON:  11/21/2019 FINDINGS: Lower chest: Visualized lung bases are clear bilaterally. The visualized heart and pericardium are unremarkable. Hepatobiliary: Liver unremarkable. Gallbladder unremarkable. No intra or extrahepatic biliary ductal dilation. Pancreas: Unremarkable Spleen: Unremarkable Adrenals/Urinary Tract: The adrenal glands are unremarkable. The kidneys are normal in size and position. Multiple nonobstructing renal calculi are again identified bilaterally within the kidneys measuring up to 5 mm within the upper pole of the left kidney. These are unchanged in appearance since prior examination. No hydronephrosis. No ureteral calculi. The bladder is unremarkable. Stomach/Bowel: The stomach, small bowel, and large bowel are unremarkable. Appendix normal. No free intraperitoneal gas or fluid. Vascular/Lymphatic: The abdominal vasculature is unremarkable. No pathologic adenopathy within the abdomen and pelvis. Reproductive: Prostate is unremarkable. Other: Tiny fat containing broad-based umbilical hernia. Rectum unremarkable. Musculoskeletal: No acute bone abnormality. IMPRESSION: 1. Stable bilateral nonobstructing renal calculi. No hydronephrosis or ureteral calculi. Electronically Signed   By: Helyn Numbers MD   On: 12/28/2019 22:01    Procedures  Procedures (including critical care time)  Medications Ordered in ED Medications  oxyCODONE-acetaminophen (PERCOCET/ROXICET) 5-325 MG per tablet 1 tablet (1 tablet Oral Given 12/28/19 1939)  sulfamethoxazole-trimethoprim (BACTRIM DS) 800-160 MG per tablet 1 tablet (has no administration in time range)  ondansetron (ZOFRAN-ODT) disintegrating tablet 4 mg (4 mg Oral Given 12/28/19 1939)  HYDROcodone-acetaminophen (NORCO/VICODIN) 5-325 MG per tablet 1 tablet (1 tablet Oral Given 12/28/19 2320)  sodium chloride 0.9 % bolus 1,000 mL (0 mLs Intravenous Stopped 12/29/19 0107)  ketorolac (TORADOL) 30 MG/ML injection 30 mg (30 mg Intravenous Given 12/28/19 2321)  HYDROmorphone (DILAUDID) injection 1 mg (1 mg Intravenous Given 12/29/19 0113)  methocarbamol (ROBAXIN) tablet 500 mg (500 mg Oral Given 12/29/19 0111)   ED Course  I have reviewed the triage vital signs and the nursing notes.  Pertinent labs & imaging results that were available during my care of the patient were reviewed by me and considered in my medical decision making (see chart for details).  29 year old presents for evaluation of left-sided flank pain over the last 2 days.  Initially was intermittent however is now constant.  Feels similar to his prior kidney stones.  Pain does not radiate.  His abdomen is soft, nontender.  He has had some blood in his urine.  He is able to fully empty his bladder.  2 episodes of NBNB emesis.  Last bowel movement yesterday without melena or bright red blood per rectum.  His heart and lungs are clear.  He does have positive CVA tap on left.  No overlying skin changes.  Neurovascularly intact  Labs and imaging personally viewed interpreted from triage: CBC leukocytosis at 10.6 Metabolic panel with mild hyperglycemia to 133, CK 64, urinalysis with large blood, positive leukocytes, bacteria with mucus.  Urine culture sent CT renal stone bilateral renal stones  however no ureteral stones, hydronephrosis or perinephric stranding.  Patient reassessed.  Pain controlled.  He is tolerating p.o. intake.  Given bacteria, leuks on urine will treat for pyelonephritis.  DC home with Keflex.  He will return for any worsening symptoms.  He is ready followed by [facility].  Patient is nontoxic, nonseptic appearing, in no apparent distress.  Patient's pain and other symptoms adequately managed in emergency department.  Fluid bolus given.  Labs, imaging and vitals reviewed.  Patient does not meet the SIRS or Sepsis criteria.  On repeat exam patient does not have a surgical abdomin and there are no peritoneal signs.  No indication of appendicitis, bowel obstruction, bowel perforation, cholecystitis, diverticulitis, AAA, dissection.  The patient has been appropriately medically screened and/or stabilized in the ED. I have low suspicion for any other emergent medical condition which would require further screening, evaluation or treatment in the ED or require inpatient management.  Patient is hemodynamically stable and in no acute distress.  Patient able to ambulate in department prior to ED.  Evaluation does not show acute pathology that would require ongoing or additional emergent interventions while in the emergency department or further inpatient treatment.  I have discussed the diagnosis with the patient and answered all questions.  Pain is been managed while in the emergency department and patient has no further complaints prior to discharge.  Patient is comfortable with plan discussed in room and is stable for discharge at this time.  I have discussed strict return precautions for returning to the emergency department.  Patient was encouraged to follow-up with PCP/specialist refer to at discharge.     MDM Rules/Calculators/A&P  Final Clinical Impression(s) / ED Diagnoses Final diagnoses:  Left flank pain  Pyelonephritis    Rx / DC  Orders ED Discharge Orders         Ordered    sulfamethoxazole-trimethoprim (BACTRIM DS) 800-160 MG tablet  2 times daily     Discontinue  Reprint     12/29/19 0135    HYDROcodone-acetaminophen (NORCO/VICODIN) 5-325 MG tablet  Every 4 hours PRN     Discontinue  Reprint     12/29/19 0135    ondansetron (ZOFRAN ODT) 4 MG disintegrating tablet  Every 8 hours PRN     Discontinue  Reprint     12/29/19 0135           Katieann Hungate A, PA-C 12/29/19 0143    Christee Mervine A, PA-C 12/29/19 0143    Molpus, John, MD 12/29/19 0502

## 2019-12-29 LAB — URINE CULTURE: Culture: <10000 — AB

## 2019-12-29 MED ORDER — HYDROCODONE-ACETAMINOPHEN 5-325 MG PO TABS: 1.0000 | ORAL_TABLET | ORAL | 0 refills | Status: DC | PRN

## 2019-12-29 MED ORDER — HYDROMORPHONE HCL 1 MG/ML IJ SOLN
1.0000 mg | Freq: Once | INTRAMUSCULAR | Status: AC
  Administered 2019-12-29: 1 mg via INTRAVENOUS
  Filled 2019-12-29: qty 1

## 2019-12-29 MED ORDER — SULFAMETHOXAZOLE-TRIMETHOPRIM 800-160 MG PO TABS
1.0000 | ORAL_TABLET | Freq: Once | ORAL | Status: AC
  Administered 2019-12-29: 1 via ORAL
  Filled 2019-12-29: qty 1

## 2019-12-29 MED ORDER — SULFAMETHOXAZOLE-TRIMETHOPRIM 800-160 MG PO TABS
1.0000 | ORAL_TABLET | Freq: Two times a day (BID) | ORAL | 0 refills | Status: AC
Start: 2019-12-29 — End: 2020-01-05

## 2019-12-29 MED ORDER — METHOCARBAMOL 500 MG PO TABS
500.0000 mg | ORAL_TABLET | Freq: Once | ORAL | Status: AC
  Administered 2019-12-29: 500 mg via ORAL
  Filled 2019-12-29: qty 1

## 2019-12-29 MED ORDER — ONDANSETRON 4 MG PO TBDP
4.0000 mg | ORAL_TABLET | Freq: Three times a day (TID) | ORAL | 0 refills | Status: DC | PRN
Start: 2019-12-29 — End: 2020-06-09

## 2019-12-29 NOTE — Discharge Instructions (Signed)
Take the antibiotics as prescribed Take the Zofran as needed for nausea or vomiting Take the Norco for pain  Follow up with [facility]  Return for new or worsening symptoms

## 2019-12-30 DIAGNOSIS — N200 Calculus of kidney: Secondary | ICD-10-CM | POA: Diagnosis not present

## 2020-01-25 ENCOUNTER — Ambulatory Visit (HOSPITAL_COMMUNITY)
Admission: EM | Admit: 2020-01-25 | Discharge: 2020-01-25 | Disposition: A | Discharge status: Home / Self Care | Payer: BC Managed Care – PPO | Attending: Family Medicine | Admitting: Family Medicine

## 2020-01-25 ENCOUNTER — Ambulatory Visit (INDEPENDENT_AMBULATORY_CARE_PROVIDER_SITE_OTHER): Payer: BC Managed Care – PPO

## 2020-01-25 ENCOUNTER — Encounter (HOSPITAL_COMMUNITY): Payer: Self-pay

## 2020-01-25 ENCOUNTER — Other Ambulatory Visit: Payer: Self-pay

## 2020-01-25 DIAGNOSIS — Z20822 Contact with and (suspected) exposure to covid-19: Secondary | ICD-10-CM | POA: Diagnosis not present

## 2020-01-25 DIAGNOSIS — Z880 Allergy status to penicillin: Secondary | ICD-10-CM | POA: Diagnosis not present

## 2020-01-25 DIAGNOSIS — Z885 Allergy status to narcotic agent status: Secondary | ICD-10-CM | POA: Diagnosis not present

## 2020-01-25 DIAGNOSIS — J029 Acute pharyngitis, unspecified: Secondary | ICD-10-CM | POA: Diagnosis not present

## 2020-01-25 DIAGNOSIS — J9811 Atelectasis: Secondary | ICD-10-CM | POA: Insufficient documentation

## 2020-01-25 DIAGNOSIS — J069 Acute upper respiratory infection, unspecified: Secondary | ICD-10-CM | POA: Insufficient documentation

## 2020-01-25 DIAGNOSIS — R05 Cough: Secondary | ICD-10-CM

## 2020-01-25 DIAGNOSIS — R0602 Shortness of breath: Secondary | ICD-10-CM

## 2020-01-25 DIAGNOSIS — Z87891 Personal history of nicotine dependence: Secondary | ICD-10-CM | POA: Insufficient documentation

## 2020-01-25 LAB — POCT RAPID STREP A, ED / UC: Streptococcus, Group A Screen (Direct): NEGATIVE

## 2020-01-25 MED ORDER — HYDROCODONE-HOMATROPINE 5-1.5 MG/5ML PO SYRP: 5.0000 mL | ORAL_SOLUTION | Freq: Four times a day (QID) | ORAL | 0 refills | Status: DC | PRN

## 2020-01-25 NOTE — Discharge Instructions (Addendum)
Be aware, your cough medication may cause drowsiness. Please do not drive, operate heavy machinery or make important decisions while on this medication, it can cloud your judgement.  You have been tested for COVID-19 today. If your test returns positive, you will receive a phone call from [facility] regarding your results. Negative test results are not called. Both positive and negative results area always visible on MyChart. If you do not have a MyChart account, sign up instructions are provided in your discharge papers. Please do not hesitate to contact us should you have questions or concerns.  

## 2020-01-25 NOTE — ED Triage Notes (Signed)
Pt is here with SOB, deep cough and sore throat that started Saturday, pt has not taken any meds to relieve discomfort.

## 2020-01-26 LAB — SARS CORONAVIRUS 2 (TAT 6-24 HRS): SARS Coronavirus 2: NEGATIVE

## 2020-01-26 NOTE — ED Provider Notes (Signed)
[facility]   [id number] 01/25/20 Arrival Time: 1630  ASSESSMENT & PLAN:  1. Viral URI with cough   2. Shortness of breath     Rapid strep negative. I have personally viewed the imaging studies ordered this visit. Lower atelectasis. No PNA or pneumothorax.   Meds ordered this encounter  Medications  . HYDROcodone-homatropine (HYCODAN) 5-1.5 MG/5ML syrup    Sig: Take 5 mLs by mouth every 6 (six) hours as needed for cough.    Dispense:  90 mL    Refill:  0    COVID-19 testing sent. See letter/work note on file for self-isolation guidelines. OTC symptom care as needed.   Follow-up Information    [facility] EMERGENCY DEPARTMENT.   Specialty: Emergency Medicine Why: If symptoms worsen in any way. Contact information: [address] [id number] [place] [address] [phone]              Reviewed expectations re: course of current medical issues. Questions answered. Outlined signs and symptoms indicating need for more acute intervention. Understanding verbalized. After Visit Summary given.   SUBJECTIVE: History from: patient. Jacob Nixon. is a 29 y.o. male who reports URI symptoms with SOB today. No wheezing. More with exertion. Afebrile. Known COVID-19 contact: none. Recent travel: none. Normal PO intake without n/v/d. Also with sore throat.    OBJECTIVE:  Vitals:   01/25/20 1843 01/25/20 1845  BP:  119/82  Pulse:  (!) 111  Resp:  19  Temp:  99 F (37.2 C)  TempSrc:  Oral  SpO2:  98%  Weight: 121.8 kg     General appearance: alert; no distress Eyes: PERRLA; EOMI; conjunctiva normal HENT: [place]; AT; nasal congestion Neck: supple  Lungs: speaks full sentences without difficulty; unlabored Extremities: no edema Skin: warm and dry Neurologic: normal gait Psychological: alert and cooperative; normal mood and affect  Labs:  Labs Reviewed  SARS CORONAVIRUS 2 (TAT 6-24 HRS)  CULTURE, GROUP A  STREP [facility])  POCT RAPID STREP A, ED / UC    Imaging: DG Chest 2 View  Result Date: 01/25/2020 CLINICAL DATA:  Cough and shortness of breath. EXAM: CHEST - 2 VIEW COMPARISON:  Chest x-ray dated April 28, 2019. FINDINGS: The heart size and mediastinal contours are within normal limits. Normal pulmonary vascularity. Minimal bibasilar atelectasis. No focal consolidation, pleural effusion, or pneumothorax. No acute osseous abnormality. IMPRESSION: 1. Minimal bibasilar atelectasis. Electronically Signed   By: Obie Dredge M.D.   On: 01/25/2020 19:14    Allergies  Allergen Reactions  . Coconut Flavor     Scratchy throat  . Fentanyl     Pt reports feeling sick with fentanyl  . Penicillins     DID THE REACTION INVOLVE: Swelling of the face/tongue/throat, SOB, or low BP? Y Sudden or severe rash/hives, skin peeling, or the inside of the mouth or nose? N  Did it require medical treatment? Y When did it last happen?January 2020 If all above answers are "NO", may proceed with cephalosporin use.     Past Medical History:  Diagnosis Date  . Bronchitis   . Cellulitis of right hand - RECURRENT   . Kidney stones    Social History   Socioeconomic History  . Marital status: Married    Spouse name: Not on file  . Number of children: Not on file  . Years of education: Not on file  . Highest education level: Not on file  Occupational History  . Not on  file  Tobacco Use  . Smoking status: Former Smoker    Types: Cigarettes  . Smokeless tobacco: Never Used  Vaping Use  . Vaping Use: Every day  . Substances: Nicotine, Flavoring  Substance and Sexual Activity  . Alcohol use: Yes  . Drug use: No  . Sexual activity: Yes    Birth control/protection: None  Other Topics Concern  . Not on file  Social History Narrative  . Not on file   Social Determinants of Health   Financial Resource Strain:   . Difficulty of Paying Living Expenses: Not on file  Food Insecurity:   . Worried  About Programme researcher, broadcasting/film/video in the Last Year: Not on file  . Ran Out of Food in the Last Year: Not on file  Transportation Needs:   . Lack of Transportation (Medical): Not on file  . Lack of Transportation (Non-Medical): Not on file  Physical Activity:   . Days of Exercise per Week: Not on file  . Minutes of Exercise per Session: Not on file  Stress:   . Feeling of Stress : Not on file  Social Connections:   . Frequency of Communication with Friends and Family: Not on file  . Frequency of Social Gatherings with Friends and Family: Not on file  . Attends Religious Services: Not on file  . Active Member of Clubs or Organizations: Not on file  . Attends Banker Meetings: Not on file  . Marital Status: Not on file  Intimate Partner Violence:   . Fear of Current or Ex-Partner: Not on file  . Emotionally Abused: Not on file  . Physically Abused: Not on file  . Sexually Abused: Not on file   Family History  Problem Relation Age of Onset  . Hypertension Other   . Diabetes Father   . Healthy Mother    Past Surgical History:  Procedure Laterality Date  . CARPAL TUNNEL RELEASE Left 04/09/2016   Procedure: CARPAL TUNNEL RELEASE;  Surgeon: Cammy Copa, MD;  Location: [facility] OR;  Service: Orthopedics;  Laterality: Left;  . HAND SURGERY    . KIDNEY STONE SURGERY     lithortripsy     Mardella Layman, MD 01/26/20 [phone]

## 2020-01-28 LAB — CULTURE, GROUP A STREP (THRC)

## 2020-04-20 IMAGING — CR DG WRIST COMPLETE 3+V*R*
4 series · 4 of 4 positions shown · non-contrast
Comparison: None.

CLINICAL DATA: Pain and swelling for 2 weeks, no known injury,
initial encounter

EXAM:
RIGHT WRIST - COMPLETE 3+ VIEW

[x wrist pa right]
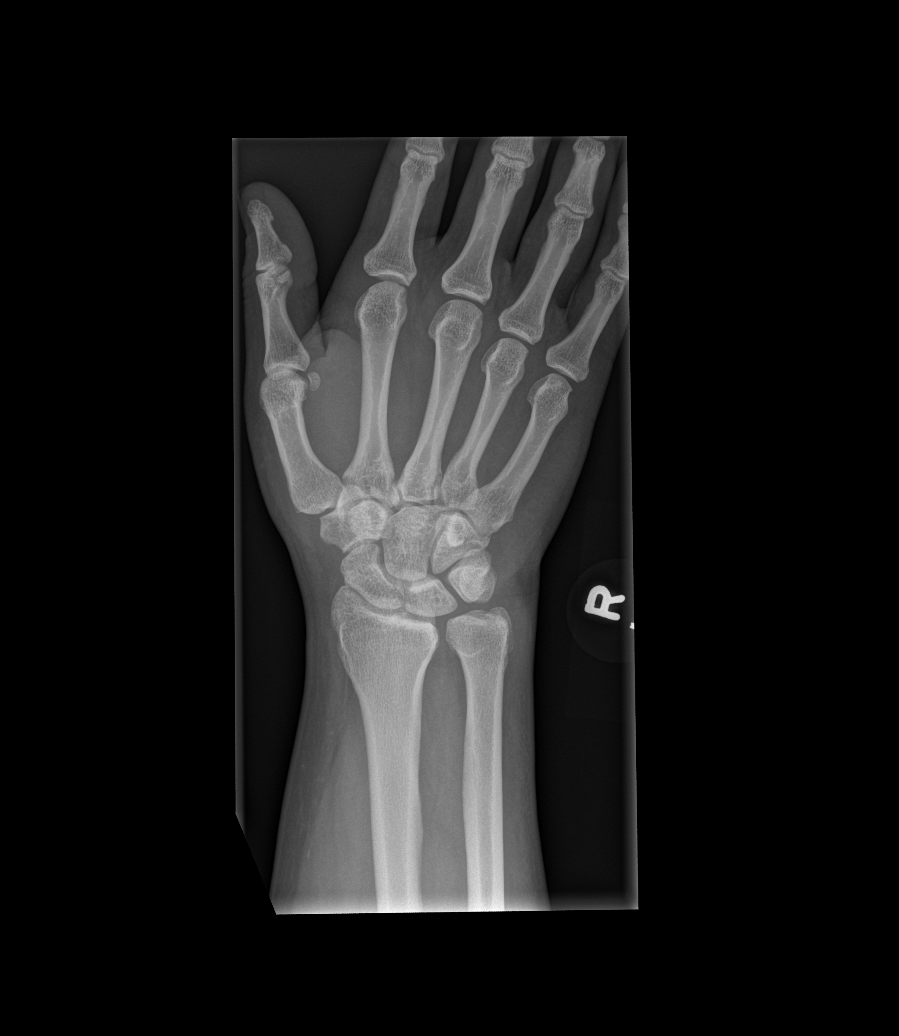

[x wrist navicular view right]
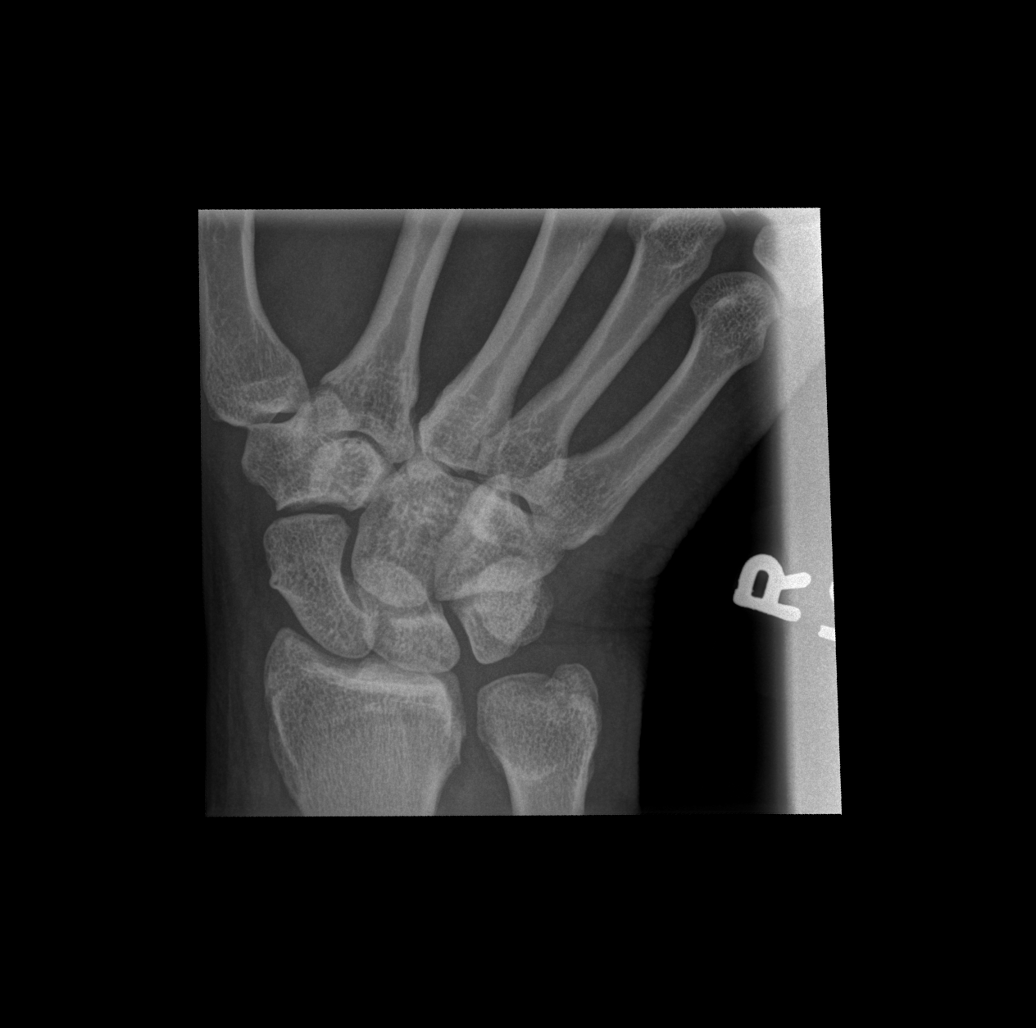

[x wrist obl right]
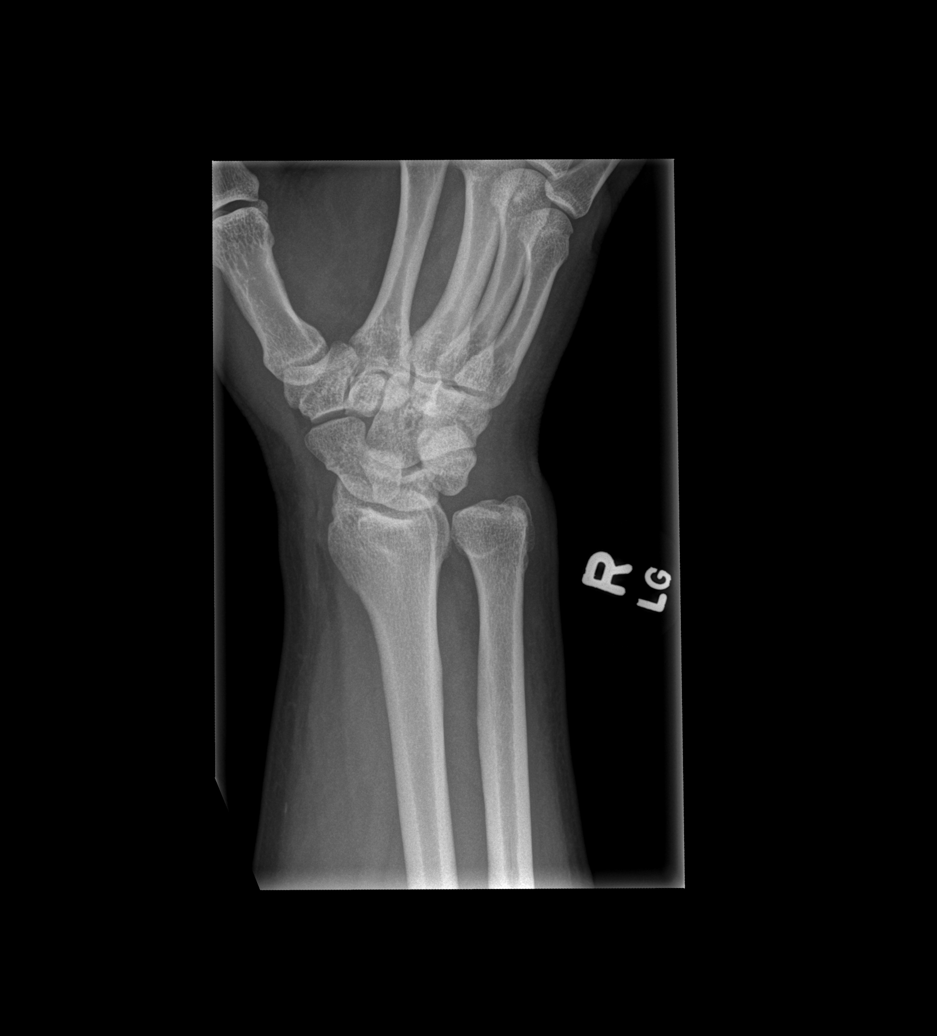

[x wrist lat right]
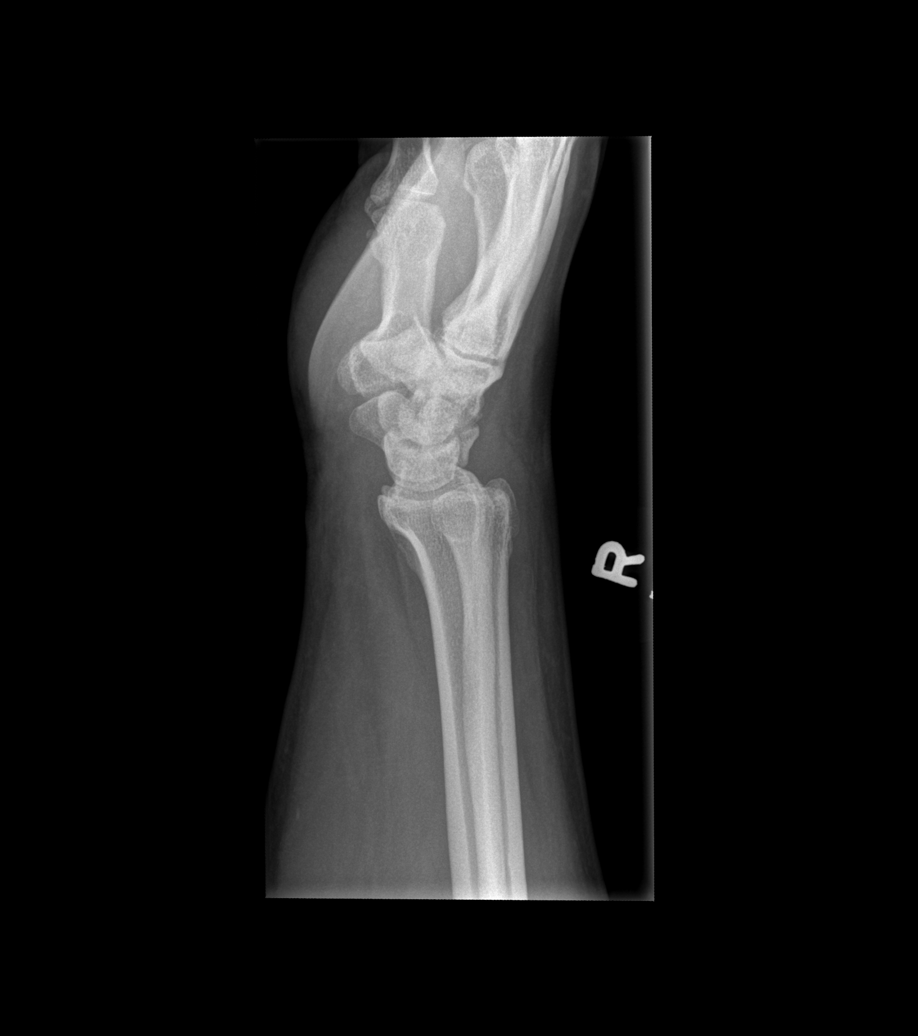

[4 of 4 positions shown; findings below may reference images not displayed]

FINDINGS: There is no evidence of fracture or dislocation. There is no
evidence of arthropathy or other focal bone abnormality. Soft
tissues are unremarkable.
IMPRESSION: No acute abnormality noted.

## 2020-05-21 ENCOUNTER — Emergency Department: Payer: HRSA Program

## 2020-05-21 ENCOUNTER — Other Ambulatory Visit: Payer: Self-pay

## 2020-05-21 ENCOUNTER — Emergency Department
Admission: EM | Admit: 2020-05-21 | Discharge: 2020-05-21 | Disposition: A | Discharge status: Home / Self Care | Payer: HRSA Program | Attending: Emergency Medicine | Admitting: Emergency Medicine

## 2020-05-21 ENCOUNTER — Encounter: Payer: Self-pay | Admitting: Emergency Medicine

## 2020-05-21 DIAGNOSIS — Z87891 Personal history of nicotine dependence: Secondary | ICD-10-CM | POA: Diagnosis not present

## 2020-05-21 DIAGNOSIS — U071 COVID-19: Secondary | ICD-10-CM | POA: Insufficient documentation

## 2020-05-21 DIAGNOSIS — R059 Cough, unspecified: Secondary | ICD-10-CM | POA: Diagnosis present

## 2020-05-21 LAB — RESP PANEL BY RT-PCR (FLU A&B, COVID) ARPGX2
Influenza A by PCR: NEGATIVE
Influenza B by PCR: NEGATIVE
SARS Coronavirus 2 by RT PCR: POSITIVE — AB

## 2020-05-21 LAB — GROUP A STREP BY PCR: Group A Strep by PCR: NOT DETECTED

## 2020-05-21 MED ORDER — ACETAMINOPHEN 325 MG PO TABS
650.0000 mg | ORAL_TABLET | Freq: Once | ORAL | Status: AC
  Administered 2020-05-21: 650 mg via ORAL
  Filled 2020-05-21: qty 2

## 2020-05-21 NOTE — ED Provider Notes (Signed)
[facility] Emergency Department Provider Note  ____________________________________________  Time seen: Approximately 1:41 PM  I have reviewed the triage vital signs and the nursing notes.   HISTORY  Chief Complaint URI and Headache    HPI Jacob Nixon. is a 30 y.o. male that presents to the emergency department for evaluation of low-grade fever, headache, sore throat, nonproductive cough, body aches for 2 days.  He states that it feels like he is swallowing razor blades.  Patient states that he was just notified that he has a Covid exposure.  He received the first dose of the [place] vaccine in October but forgot to go back and get the second.  No shortness of breath, chest pain, vomiting, abdominal pain, diarrhea.  Past Medical History:  Diagnosis Date  . Bronchitis   . Cellulitis of right hand - RECURRENT   . Kidney stones     Patient Active Problem List   Diagnosis Date Noted  . Left anterior knee pain 10/13/2019  . Cervical disc disorder with radiculopathy of cervical region 08/23/2019  . Low back pain 08/23/2019  . Nonallopathic lesion of lumbosacral region 08/23/2019  . Nonallopathic lesion of sacral region 08/23/2019  . Nonallopathic lesion of thoracic region 08/23/2019  . Labral tear of shoulder 12/15/2018  . Hyponatremia 10/26/2016  . Hypokalemia 10/26/2016  . Elevated glucose level   . Cellulitis of hand 08/04/2016  . Carpal tunnel syndrome on right 05/08/2016  . Abscess of upper arm   . Cellulitis 01/22/2016  . Leukocytosis 01/22/2016  . Neuropathy 01/22/2016  . Nephrolithiasis 01/22/2016  . Cellulitis of right hand   . Cellulitis of forearm, right 07/30/2015    Past Surgical History:  Procedure Laterality Date  . CARPAL TUNNEL RELEASE Left 04/09/2016   Procedure: CARPAL TUNNEL RELEASE;  Surgeon: Meredith Pel, MD;  Location: [place];  Service: Orthopedics;  Laterality: Left;  . HAND SURGERY    . KIDNEY STONE SURGERY      lithortripsy    Prior to Admission medications   Medication Sig Start Date End Date Taking? Authorizing Provider  acetaZOLAMIDE (DIAMOX) 125 MG tablet Take 1 tablet (125 mg total) by mouth 2 (two) times daily. 06/02/19   Lyndal Pulley, DO  gabapentin (NEURONTIN) 300 MG capsule Take 1 capsule (300 mg total) by mouth 3 (three) times daily as needed (nerve pain). 12/22/19   Gregor Hams, MD  HYDROcodone-homatropine [facility]) 5-1.5 MG/5ML syrup Take 5 mLs by mouth every 6 (six) hours as needed for cough. 01/25/20   Vanessa Kick, MD  ibuprofen (ADVIL) 800 MG tablet Take 1 tablet (800 mg total) by mouth 3 (three) times daily. 10/17/19   Wieters, Hallie C, PA-C  montelukast (SINGULAIR) 10 MG tablet Take 1 tablet (10 mg total) by mouth at bedtime. 12/15/18   Lyndal Pulley, DO  omeprazole (PRILOSEC) 20 MG capsule Take 1 capsule (20 mg total) by mouth daily. 11/21/19   Montine Circle, PA-C  ondansetron (ZOFRAN ODT) 4 MG disintegrating tablet Take 1 tablet (4 mg total) by mouth every 8 (eight) hours as needed for nausea or vomiting. 12/29/19   Henderly, Britni A, PA-C  predniSONE (DELTASONE) 50 MG tablet Take 1 tablet (50 mg total) by mouth daily. 12/22/19   Gregor Hams, MD  sucralfate (CARAFATE) 1 g tablet Take 1 tablet (1 g total) by mouth 4 (four) times daily -  with meals and at bedtime. 11/21/19   Montine Circle, PA-C  traZODone (DESYREL) 50 MG tablet  Take 0.5-1 tablets (25-50 mg total) by mouth at bedtime as needed for sleep. 06/02/19   Judi Saa, DO    Allergies Coconut flavor, Fentanyl, and Penicillins  Family History  Problem Relation Age of Onset  . Hypertension Other   . Diabetes Father   . Healthy Mother     Social History Social History   Tobacco Use  . Smoking status: Former Smoker    Types: Cigarettes  . Smokeless tobacco: Never Used  Vaping Use  . Vaping Use: Every day  . Substances: Nicotine, Flavoring  Substance Use Topics  . Alcohol use: Yes  . Drug use: No      Review of Systems  Constitutional: Positive for fever. Eyes: No visual changes. No discharge. ENT: Negative for congestion and rhinorrhea. Positive for sore throat. Cardiovascular: No chest pain. Respiratory: Positive for cough. No SOB. Gastrointestinal: No abdominal pain.  No nausea, no vomiting.  No diarrhea.  No constipation. Musculoskeletal: Positive for body aches. Skin: Negative for rash, abrasions, lacerations, ecchymosis. Neurological: Positive for headache.   ____________________________________________   PHYSICAL EXAM:  VITAL SIGNS: ED Triage Vitals  Enc Vitals Group     BP 05/21/20 1037 116/76     Pulse Rate 05/21/20 1037 (!) 115     Resp 05/21/20 1037 20     Temp 05/21/20 1037 (!) 100.4 F (38 C)     Temp Source 05/21/20 1037 Oral     SpO2 05/21/20 1037 97 %     Weight 05/21/20 1005 260 lb (117.9 kg)     Height 05/21/20 1005 5\' 11"  (1.803 m)     Head Circumference --      Peak Flow --      Pain Score 05/21/20 1005 7     Pain Loc --      Pain Edu? --      Excl. in GC? --      Constitutional: Alert and oriented. Well appearing and in no acute distress. Eyes: Conjunctivae are normal. PERRL. EOMI. No discharge. Head: Atraumatic. ENT: No frontal and maxillary sinus tenderness.      Ears: Tympanic membranes pearly gray with good landmarks. No discharge.      Nose: Mild congestion/rhinnorhea.      Mouth/Throat: Mucous membranes are moist. Oropharynx non-erythematous. Tonsils not enlarged. No exudates. Uvula midline. Neck: No stridor.   Hematological/Lymphatic/Immunilogical: No cervical lymphadenopathy. Cardiovascular: Normal rate, regular rhythm.  Good peripheral circulation. Respiratory: Normal respiratory effort without tachypnea or retractions. Lungs CTAB. Good air entry to the bases with no decreased or absent breath sounds. Gastrointestinal: Bowel sounds 4 quadrants. Soft and nontender to palpation. No guarding or rigidity. No palpable masses. No  distention. Musculoskeletal: Full range of motion to all extremities. No gross deformities appreciated. Neurologic:  Normal speech and language. No gross focal neurologic deficits are appreciated.  Skin:  Skin is warm, dry and intact. No rash noted. Psychiatric: Mood and affect are normal. Speech and behavior are normal. Patient exhibits appropriate insight and judgement.   ____________________________________________   LABS (all labs ordered are listed, but only abnormal results are displayed)  Labs Reviewed  RESP PANEL BY RT-PCR (FLU A&B, COVID) ARPGX2 - Abnormal; Notable for the following components:      Result Value   SARS Coronavirus 2 by RT PCR POSITIVE (*)    All other components within normal limits  GROUP A STREP BY PCR   ____________________________________________  EKG   ____________________________________________  RADIOLOGY 07/19/20, personally viewed and evaluated these  images (plain radiographs) as part of my medical decision making, as well as reviewing the written report by the radiologist.  DG Chest 1 View  Result Date: 05/21/2020 CLINICAL DATA:  Cough, fever EXAM: CHEST  1 VIEW COMPARISON:  01/25/2020 FINDINGS: Heart and mediastinal contours are within normal limits. No focal opacities or effusions. No acute bony abnormality. IMPRESSION: No active disease. Electronically Signed   By: Charlett Nose M.D.   On: 05/21/2020 13:56    ____________________________________________    PROCEDURES  Procedure(s) performed:    Procedures    Medications  acetaminophen (TYLENOL) tablet 650 mg (650 mg Oral Given 05/21/20 1340)     ____________________________________________   INITIAL IMPRESSION / ASSESSMENT AND PLAN / ED COURSE  Pertinent labs & imaging results that were available during my care of the patient were reviewed by me and considered in my medical decision making (see chart for details).  Review of the [place] CSRS was performed in accordance  of the [facility] prior to dispensing any controlled drugs.   Patient's diagnosis is consistent with Covid. Vital signs and exam are reassuring. Covid test is positive. Patient appears well and is staying well hydrated. Patient feels comfortable going home.  Patient is to follow up with PCP as needed or otherwise directed. Patient is given ED precautions to return to the ED for any worsening or new symptoms.  Jacob Nixon. was evaluated in Emergency Department on 05/21/2020 for the symptoms described in the history of present illness. He was evaluated in the context of the global COVID-19 pandemic, which necessitated consideration that the patient might be at risk for infection with the SARS-CoV-2 virus that causes COVID-19. Institutional protocols and algorithms that pertain to the evaluation of patients at risk for COVID-19 are in a state of rapid change based on information released by regulatory bodies including the CDC and federal and state organizations. These policies and algorithms were followed during the patient's care in the ED.   ____________________________________________  FINAL CLINICAL IMPRESSION(S) / ED DIAGNOSES  Final diagnoses:  COVID-19      NEW MEDICATIONS STARTED DURING THIS VISIT:  ED Discharge Orders    None          This chart was dictated using voice recognition software/Dragon. Despite best efforts to proofread, errors can occur which can change the meaning. Any change was purely unintentional.    Enid Derry, PA-C 05/21/20 1559    Minna Antis, MD 05/23/20 [phone]

## 2020-05-21 NOTE — ED Triage Notes (Signed)
Pt reports bodyaches and HA and one of hs co-workers has covid so he would like to be tested.

## 2020-06-08 ENCOUNTER — Other Ambulatory Visit: Payer: Self-pay

## 2020-06-08 ENCOUNTER — Encounter: Payer: Self-pay | Admitting: *Deleted

## 2020-06-08 DIAGNOSIS — R310 Gross hematuria: Secondary | ICD-10-CM | POA: Diagnosis not present

## 2020-06-08 DIAGNOSIS — Z87891 Personal history of nicotine dependence: Secondary | ICD-10-CM | POA: Diagnosis not present

## 2020-06-08 DIAGNOSIS — R319 Hematuria, unspecified: Secondary | ICD-10-CM | POA: Diagnosis present

## 2020-06-08 LAB — URINALYSIS, COMPLETE (UACMP) WITH MICROSCOPIC
Bacteria, UA: NONE SEEN
Bilirubin Urine: NEGATIVE
Glucose, UA: NEGATIVE mg/dL
Ketones, ur: NEGATIVE mg/dL
Leukocytes,Ua: NEGATIVE
Nitrite: NEGATIVE
Protein, ur: 100 mg/dL — AB
RBC / HPF: >50 RBC/hpf — ABNORMAL HIGH (ref 0–5)
Specific Gravity, Urine: 1.026 (ref 1.005–1.030)
Squamous Epithelial / HPF: NONE SEEN (ref 0–5)
pH: 5 (ref 5.0–8.0)

## 2020-06-08 LAB — CBC
HCT: 44.5 % (ref 39.0–52.0)
Hemoglobin: 15 g/dL (ref 13.0–17.0)
MCH: 28.8 pg (ref 26.0–34.0)
MCHC: 33.7 g/dL (ref 30.0–36.0)
MCV: 85.4 fL (ref 80.0–100.0)
Platelets: 469 10*3/uL — ABNORMAL HIGH (ref 150–400)
RBC: 5.21 MIL/uL (ref 4.22–5.81)
RDW: 13.2 % (ref 11.5–15.5)
WBC: 12.3 10*3/uL — ABNORMAL HIGH (ref 4.0–10.5)
nRBC: 0 % (ref 0.0–0.2)

## 2020-06-08 LAB — BASIC METABOLIC PANEL
Anion gap: 12 (ref 5–15)
BUN: 11 mg/dL (ref 6–20)
CO2: 24 mmol/L (ref 22–32)
Calcium: 9.2 mg/dL (ref 8.9–10.3)
Chloride: 101 mmol/L (ref 98–111)
Creatinine, Ser: 0.9 mg/dL (ref 0.61–1.24)
GFR, Estimated: >60 mL/min (ref >60)
Glucose, Bld: 134 mg/dL — ABNORMAL HIGH (ref 70–99)
Potassium: 3.8 mmol/L (ref 3.5–5.1)
Sodium: 137 mmol/L (ref 135–145)

## 2020-06-08 NOTE — ED Triage Notes (Addendum)
Pt to ED with blood in urine started this evening with left groin pain after urinating. Pt reports color of urine is similar top "cranberry juice" unsure if there were clots. No hx of hematuria but hx of kidney stones. Nausea without vomiting or diarrhea also reported today.   Pt took an old percocet at 20:00 this evening that helped slightly with the pain but pt reporting it is wearing off at this time.

## 2020-06-09 ENCOUNTER — Emergency Department: Payer: BC Managed Care – PPO

## 2020-06-09 ENCOUNTER — Emergency Department
Admission: EM | Admit: 2020-06-09 | Discharge: 2020-06-09 | Disposition: A | Discharge status: Home / Self Care | Payer: BC Managed Care – PPO | Attending: Emergency Medicine | Admitting: Emergency Medicine

## 2020-06-09 ENCOUNTER — Encounter: Payer: Self-pay | Admitting: Radiology

## 2020-06-09 DIAGNOSIS — R310 Gross hematuria: Secondary | ICD-10-CM

## 2020-06-09 MED ORDER — ACETAMINOPHEN 500 MG PO TABS: 1000.0000 mg | ORAL_TABLET | Freq: Three times a day (TID) | ORAL | 0 refills | Status: DC | PRN

## 2020-06-09 MED ORDER — OXYCODONE HCL 5 MG PO TABS
5.0000 mg | ORAL_TABLET | Freq: Once | ORAL | Status: AC
  Administered 2020-06-09: 5 mg via ORAL
  Filled 2020-06-09: qty 1

## 2020-06-09 MED ORDER — ONDANSETRON 4 MG PO TBDP: 4.0000 mg | ORAL_TABLET | Freq: Three times a day (TID) | ORAL | 0 refills | Status: DC | PRN

## 2020-06-09 MED ORDER — SULFAMETHOXAZOLE-TRIMETHOPRIM 800-160 MG PO TABS: 1.0000 | ORAL_TABLET | Freq: Two times a day (BID) | ORAL | 0 refills | Status: AC

## 2020-06-09 MED ORDER — KETOROLAC TROMETHAMINE 30 MG/ML IJ SOLN
15.0000 mg | Freq: Once | INTRAMUSCULAR | Status: AC
  Administered 2020-06-09: 15 mg via INTRAVENOUS
  Filled 2020-06-09: qty 1

## 2020-06-09 MED ORDER — ONDANSETRON 4 MG PO TBDP
4.0000 mg | ORAL_TABLET | Freq: Once | ORAL | Status: AC
  Administered 2020-06-09: 4 mg via ORAL
  Filled 2020-06-09: qty 1

## 2020-06-09 MED ORDER — MORPHINE SULFATE (PF) 4 MG/ML IV SOLN
4.0000 mg | Freq: Once | INTRAVENOUS | Status: AC
  Administered 2020-06-09: 4 mg via INTRAVENOUS
  Filled 2020-06-09: qty 1

## 2020-06-09 MED ORDER — TRAMADOL HCL 50 MG PO TABS: 50.0000 mg | ORAL_TABLET | Freq: Four times a day (QID) | ORAL | 0 refills | Status: DC | PRN

## 2020-06-09 MED ORDER — IOHEXOL 350 MG/ML SOLN
100.0000 mL | Freq: Once | INTRAVENOUS | Status: AC | PRN
  Administered 2020-06-09: 100 mL via INTRAVENOUS
  Filled 2020-06-09: qty 100

## 2020-06-09 MED ORDER — SODIUM CHLORIDE 0.9 % IV SOLN
1.0000 g | Freq: Once | INTRAVENOUS | Status: AC
  Administered 2020-06-09: 1 g via INTRAVENOUS
  Filled 2020-06-09: qty 10

## 2020-06-09 MED ORDER — KETOROLAC TROMETHAMINE 30 MG/ML IJ SOLN
30.0000 mg | Freq: Once | INTRAMUSCULAR | Status: AC
  Administered 2020-06-09: 30 mg via INTRAMUSCULAR
  Filled 2020-06-09: qty 1

## 2020-06-09 MED ORDER — ACETAMINOPHEN 500 MG PO TABS
1000.0000 mg | ORAL_TABLET | Freq: Once | ORAL | Status: AC
  Administered 2020-06-09: 1000 mg via ORAL
  Filled 2020-06-09: qty 2

## 2020-06-09 MED ORDER — SODIUM CHLORIDE 0.9 % IV BOLUS
1000.0000 mL | Freq: Once | INTRAVENOUS | Status: AC
  Administered 2020-06-09: 1000 mL via INTRAVENOUS

## 2020-06-09 NOTE — ED Provider Notes (Signed)
----------------------------------------- °  9:18 AM on 06/09/2020 -----------------------------------------  I took over care of this patient from Dr. Don Perking.  CT angio of the abdomen/pelvis was pending.  It is negative.  On reassessment, the patient appears comfortable.  His blood pressure has normalized and his other vital signs have remained stable.  He is stable for discharge at this time.  I counseled him on the results of the work-up and gave him return precautions.  He expresses understanding.   Dionne Bucy, MD 06/09/20 [phone]

## 2020-06-09 NOTE — ED Notes (Signed)
Pt to CT

## 2020-06-09 NOTE — Discharge Instructions (Addendum)
Take the antibiotics as prescribed and finish the full course.    Return to the ER for new, worsening, or persistent severe pain, fever, vomiting, weakness, or any other new or worsening symptoms that concern you.

## 2020-06-09 NOTE — ED Provider Notes (Signed)
[facility] Emergency Department Provider Note  ____________________________________________  Time seen: Approximately 7:50 AM  I have reviewed the triage vital signs and the nursing notes.   HISTORY  Chief Complaint Hematuria   HPI Jacob Nixon. is a 30 y.o. male with a history of kidney stones who presents for evaluation of hematuria.  Patient reports that his symptoms started this evening.  He is complaining of sharp left-sided abdominal pain radiating into his groin and into the flank associated with gross hematuria.  He is not on blood thinners.  Denies any prior history of hematuria.  He denies dysuria.  Has had nausea but no vomiting.  He took a Percocet at home which helped the pain slightly.  He currently complaining of severe pain.  Patient has had prior kidney stones requiring lithotripsy.   Past Medical History:  Diagnosis Date  . Bronchitis   . Cellulitis of right hand - RECURRENT   . Kidney stones     Patient Active Problem List   Diagnosis Date Noted  . Left anterior knee pain 10/13/2019  . Cervical disc disorder with radiculopathy of cervical region 08/23/2019  . Low back pain 08/23/2019  . Nonallopathic lesion of lumbosacral region 08/23/2019  . Nonallopathic lesion of sacral region 08/23/2019  . Nonallopathic lesion of thoracic region 08/23/2019  . Labral tear of shoulder 12/15/2018  . Hyponatremia 10/26/2016  . Hypokalemia 10/26/2016  . Elevated glucose level   . Cellulitis of hand 08/04/2016  . Carpal tunnel syndrome on right 05/08/2016  . Abscess of upper arm   . Cellulitis 01/22/2016  . Leukocytosis 01/22/2016  . Neuropathy 01/22/2016  . Nephrolithiasis 01/22/2016  . Cellulitis of right hand   . Cellulitis of forearm, right 07/30/2015    Past Surgical History:  Procedure Laterality Date  . CARPAL TUNNEL RELEASE Left 04/09/2016   Procedure: CARPAL TUNNEL RELEASE;  Surgeon: Cammy Copa, MD;  Location: [facility] OR;   Service: Orthopedics;  Laterality: Left;  . HAND SURGERY    . KIDNEY STONE SURGERY     lithortripsy    Prior to Admission medications   Medication Sig Start Date End Date Taking? Authorizing Provider  acetaminophen (TYLENOL) 500 MG tablet Take 2 tablets (1,000 mg total) by mouth every 8 (eight) hours as needed for mild pain, moderate pain, fever or headache. 06/09/20 06/09/21 Yes Don Perking, [place], MD  ondansetron (ZOFRAN ODT) 4 MG disintegrating tablet Take 1 tablet (4 mg total) by mouth every 8 (eight) hours as needed. 06/09/20  Yes Don Perking, [place], MD  sulfamethoxazole-trimethoprim (BACTRIM DS) 800-160 MG tablet Take 1 tablet by mouth 2 (two) times daily for 7 days. 06/09/20 06/16/20 Yes Dyquan Minks, [place], MD  traMADol (ULTRAM) 50 MG tablet Take 1 tablet (50 mg total) by mouth every 6 (six) hours as needed. 06/09/20 06/09/21 Yes Shanequa Whitenight, [place], MD  acetaZOLAMIDE (DIAMOX) 125 MG tablet Take 1 tablet (125 mg total) by mouth 2 (two) times daily. 06/02/19   Judi Saa, DO  gabapentin (NEURONTIN) 300 MG capsule Take 1 capsule (300 mg total) by mouth 3 (three) times daily as needed (nerve pain). 12/22/19   Rodolph Bong, MD  ibuprofen (ADVIL) 800 MG tablet Take 1 tablet (800 mg total) by mouth 3 (three) times daily. 10/17/19   Wieters, Hallie C, PA-C  montelukast (SINGULAIR) 10 MG tablet Take 1 tablet (10 mg total) by mouth at bedtime. 12/15/18   Judi Saa, DO  omeprazole (PRILOSEC) 20 MG capsule Take 1 capsule (20  mg total) by mouth daily. 11/21/19   Roxy Horseman, PA-C  predniSONE (DELTASONE) 50 MG tablet Take 1 tablet (50 mg total) by mouth daily. 12/22/19   Rodolph Bong, MD  sucralfate (CARAFATE) 1 g tablet Take 1 tablet (1 g total) by mouth 4 (four) times daily -  with meals and at bedtime. 11/21/19   Roxy Horseman, PA-C  traZODone (DESYREL) 50 MG tablet Take 0.5-1 tablets (25-50 mg total) by mouth at bedtime as needed for sleep. 06/02/19   Judi Saa, DO    Allergies Coconut  flavor, Fentanyl, and Penicillins  Family History  Problem Relation Age of Onset  . Hypertension Other   . Diabetes Father   . Healthy Mother     Social History Social History   Tobacco Use  . Smoking status: Former Smoker    Types: Cigarettes  . Smokeless tobacco: Never Used  Vaping Use  . Vaping Use: Every day  . Substances: Nicotine, Flavoring  Substance Use Topics  . Alcohol use: Yes  . Drug use: No    Review of Systems  Constitutional: Negative for fever. Eyes: Negative for visual changes. ENT: Negative for sore throat. Neck: No neck pain  Cardiovascular: Negative for chest pain. Respiratory: Negative for shortness of breath. Gastrointestinal: + L sided abdominal pain and nausea. No vomiting or diarrhea. Genitourinary: Negative for dysuria. + hematuria Musculoskeletal: Negative for back pain. Skin: Negative for rash. Neurological: Negative for headaches, weakness or numbness. Psych: No SI or HI  ____________________________________________   PHYSICAL EXAM:  VITAL SIGNS: ED Triage Vitals  Enc Vitals Group     BP 06/08/20 2242 115/68     Pulse Rate 06/08/20 2242 98     Resp 06/08/20 2242 16     Temp 06/08/20 2242 99.2 F (37.3 C)     Temp Source 06/08/20 2242 Oral     SpO2 06/08/20 2242 100 %     Weight 06/08/20 2243 260 lb (117.9 kg)     Height 06/08/20 2243 5\' 11"  (1.803 m)     Head Circumference --      Peak Flow --      Pain Score 06/08/20 2243 8     Pain Loc --      Pain Edu? --      Excl. in GC? --     Constitutional: Alert and oriented. Well appearing and in no apparent distress. HEENT:      Head: Normocephalic and atraumatic.         Eyes: Conjunctivae are normal. Sclera is non-icteric.       Mouth/Throat: Mucous membranes are moist.       Neck: Supple with no signs of meningismus. Cardiovascular: Regular rate and rhythm. No murmurs, gallops, or rubs. 2+ symmetrical distal pulses are present in all extremities. No JVD. Respiratory:  Normal respiratory effort. Lungs are clear to auscultation bilaterally. No wheezes, crackles, or rhonchi.  Gastrointestinal: Soft, tender to palpation on the left quadrants, and non distended with positive bowel sounds. No rebound or guarding. Genitourinary: L CVA tenderness. Musculoskeletal: Nontender with normal range of motion in all extremities. No edema, cyanosis, or erythema of extremities. Neurologic: Normal speech and language. Face is symmetric. Moving all extremities. No gross focal neurologic deficits are appreciated. Skin: Skin is warm, dry and intact. No rash noted. Psychiatric: Mood and affect are normal. Speech and behavior are normal.  ____________________________________________   LABS (all labs ordered are listed, but only abnormal results are displayed)  Labs Reviewed  URINALYSIS, COMPLETE (UACMP) WITH MICROSCOPIC - Abnormal; Notable for the following components:      Result Value   Color, Urine AMBER (*)    APPearance CLOUDY (*)    Hgb urine dipstick LARGE (*)    Protein, ur 100 (*)    RBC / HPF >50 (*)    All other components within normal limits  BASIC METABOLIC PANEL - Abnormal; Notable for the following components:   Glucose, Bld 134 (*)    All other components within normal limits  CBC - Abnormal; Notable for the following components:   WBC 12.3 (*)    Platelets 469 (*)    All other components within normal limits   ____________________________________________  EKG  none  ____________________________________________  RADIOLOGY  I have personally reviewed the images performed during this visit and I agree with the Radiologist's read.   Interpretation by Radiologist:  CT Renal Stone Study  Result Date: 06/09/2020 CLINICAL DATA:  Left groin pain after urinating. EXAM: CT ABDOMEN AND PELVIS WITHOUT CONTRAST TECHNIQUE: Multidetector CT imaging of the abdomen and pelvis was performed following the standard protocol without IV contrast. COMPARISON:  CT  dated 12/28/2019 FINDINGS: Lower chest: The lung bases are clear. The heart size is normal. Hepatobiliary: There is decreased hepatic attenuation suggestive of hepatic steatosis. Normal gallbladder.There is no biliary ductal dilation. Pancreas: Normal contours without ductal dilatation. No peripancreatic fluid collection. Spleen: Unremarkable. Adrenals/Urinary Tract: --Adrenal glands: Unremarkable. --Right kidney/ureter: Are punctate nonobstructing stones involving the right kidney. No right-sided hydronephrosis. --Left kidney/ureter: There is a nonobstructing 5 mm stone in the left renal pelvis. There are additional nonobstructing punctate stones in the lower pole the left kidney. There is no left-sided hydronephrosis. --Urinary bladder: Unremarkable. Stomach/Bowel: --Stomach/Duodenum: No hiatal hernia or other gastric abnormality. Normal duodenal course and caliber. --Small bowel: Unremarkable. --Colon: Unremarkable. --Appendix: Normal. Vascular/Lymphatic: Normal course and caliber of the major abdominal vessels. --No retroperitoneal lymphadenopathy. --No mesenteric lymphadenopathy. --No pelvic or inguinal lymphadenopathy. Reproductive: Unremarkable Other: No ascites or free air. The abdominal wall is normal. Musculoskeletal. No acute displaced fractures. IMPRESSION: 1. No acute abdominopelvic abnormality. 2. Bilateral nonobstructing nephrolithiasis. 3. Hepatic steatosis. Electronically Signed   By: Katherine Mantlehristopher  Green M.D.   On: 06/09/2020 06:20     ____________________________________________   PROCEDURES  Procedure(s) performed:yes .1-3 Lead EKG Interpretation Performed by: Nita SickleVeronese, [place], MD Authorized by: Nita SickleVeronese, [place], MD     Interpretation: normal     ECG rate assessment: normal     Rhythm: sinus rhythm     Ectopy: none     Critical Care performed:  None ____________________________________________   INITIAL IMPRESSION / ASSESSMENT AND PLAN / ED COURSE  30 y.o. male with a  history of kidney stones who presents for evaluation of hematuria, left-sided abdominal pain and left flank pain.  Initially patient was hemodynamically stable but became hypotensive with BP of 96/60.  Initial CT renal did not show any evidence of ureteral stone to justify patient's symptoms of hematuria or pain.  UA showing gross blood and some white cells but no nitrites and no bacteria therefore UTI was less likely but since patient had mild elevated white count I did send urine for culture and treated him with Rocephin with plan to discharge home on Bactrim.  He has no fever.  I ordered a CT angio to rule out any vascular etiology of his hematuria especially with now patient being hypotensive and with pretty severe pain in the setting of a negative etiology on CT without contrast.  Patient has received Toradol, oxycodone, IV morphine and is still complaining of pain.  I signed patient out to Dr. Marisa Severin at 7 AM pending the CT angio of the chest.  I also started patient on IV fluids for hypotension.  I reviewed patient's old medical records showing the patient's blood pressure is usually in the 120s to the 130s.  Ddx UTI, pyelonephritis, kidney stone, AAA, dissection.  I did discuss with patient follow-up with urology to rule out malignancy as possible cause of patient's gross hematuria in case he is discharged.       _____________________________________________ Please note:  Patient was evaluated in Emergency Department today for the symptoms described in the history of present illness. Patient was evaluated in the context of the global COVID-19 pandemic, which necessitated consideration that the patient might be at risk for infection with the SARS-CoV-2 virus that causes COVID-19. Institutional protocols and algorithms that pertain to the evaluation of patients at risk for COVID-19 are in a state of rapid change based on information released by regulatory bodies including the CDC and federal and  state organizations. These policies and algorithms were followed during the patient's care in the ED.  Some ED evaluations and interventions may be delayed as a result of limited staffing during the pandemic.   [place] Controlled Substance Database was reviewed by me. ____________________________________________   FINAL CLINICAL IMPRESSION(S) / ED DIAGNOSES   Final diagnoses:  Gross hematuria      NEW MEDICATIONS STARTED DURING THIS VISIT:  ED Discharge Orders         Ordered    traMADol (ULTRAM) 50 MG tablet  Every 6 hours PRN        06/09/20 0630    acetaminophen (TYLENOL) 500 MG tablet  Every 8 hours PRN        06/09/20 0630    ondansetron (ZOFRAN ODT) 4 MG disintegrating tablet  Every 8 hours PRN        06/09/20 0630    sulfamethoxazole-trimethoprim (BACTRIM DS) 800-160 MG tablet  2 times daily        06/09/20 0630           Note:  This document was prepared using Dragon voice recognition software and may include unintentional dictation errors.    Nita Sickle, MD 06/09/20 [phone]

## 2020-06-09 NOTE — ED Notes (Signed)
MD at bedside. 

## 2020-06-15 ENCOUNTER — Ambulatory Visit (INDEPENDENT_AMBULATORY_CARE_PROVIDER_SITE_OTHER): Payer: Self-pay | Admitting: Urology

## 2020-06-15 ENCOUNTER — Encounter: Payer: Self-pay | Admitting: Urology

## 2020-06-15 ENCOUNTER — Ambulatory Visit
Admission: RE | Admit: 2020-06-15 | Discharge: 2020-06-15 | Disposition: A | Discharge status: Home / Self Care | Payer: BC Managed Care – PPO | Source: Ambulatory Visit | Attending: Urology | Admitting: Urology

## 2020-06-15 ENCOUNTER — Other Ambulatory Visit: Payer: Self-pay

## 2020-06-15 ENCOUNTER — Telehealth: Payer: Self-pay

## 2020-06-15 VITALS — BP 134/87 | HR 106 | Ht 71.0 in | Wt 253.8 lb

## 2020-06-15 DIAGNOSIS — N200 Calculus of kidney: Secondary | ICD-10-CM | POA: Diagnosis present

## 2020-06-15 LAB — URINALYSIS, COMPLETE
Bilirubin, UA: NEGATIVE
Glucose, UA: NEGATIVE
Ketones, UA: NEGATIVE
Leukocytes,UA: NEGATIVE
Nitrite, UA: NEGATIVE
Protein,UA: NEGATIVE
Specific Gravity, UA: 1.025 (ref 1.005–1.030)
Urobilinogen, Ur: 1 mg/dL (ref 0.2–1.0)
pH, UA: 6.5 (ref 5.0–7.5)

## 2020-06-15 LAB — MICROSCOPIC EXAMINATION

## 2020-06-15 MED ORDER — HYDROCODONE-ACETAMINOPHEN 5-325 MG PO TABS
1.0000 | ORAL_TABLET | Freq: Four times a day (QID) | ORAL | 0 refills | Status: DC | PRN
Start: 2020-06-15 — End: 2020-06-22

## 2020-06-15 NOTE — Telephone Encounter (Signed)
Called pt asked that he get KUB prior to today's appointment. KUB ordered. Pt gave verbal understanding.

## 2020-06-15 NOTE — Progress Notes (Signed)
06/15/20 4:57 PM   Jacob Nixon. [birth year] [id number]  CC: Left flank pain  HPI: I saw Jacob Nixon in urology clinic for 1 week of left flank pain.  He is a 30 year old male with multiple prior kidney stones, including lithotripsy x2 who reports 1 week of left-sided flank and groin pain and blood in the urine with similar symptoms to prior stone episodes.  He was seen in the ED on 06/09/2020 and CT showed a 5 mm left UPJ stone without hydronephrosis, and this was read as a nonobstructing stone.  On my review this appears to be just above the UPJ, and the likely etiology of his symptoms.  He reports ongoing left-sided flank and groin pain.  Urinalysis today consistent with a partially obstructing stone with 0-5 WBCs, 11-30 RBCs, few bacteria, no yeast, nitrite negative, no leukocytes.  He denies any fevers or chills.  He denies any urinary symptoms.  He has a strong family history of kidney stones.   PMH: Past Medical History:  Diagnosis Date  . Bronchitis   . Cellulitis of right hand - RECURRENT   . Kidney stones     Surgical History: Past Surgical History:  Procedure Laterality Date  . CARPAL TUNNEL RELEASE Left 04/09/2016   Procedure: CARPAL TUNNEL RELEASE;  Surgeon: Cammy Copa, MD;  Location: [facility] OR;  Service: Orthopedics;  Laterality: Left;  . HAND SURGERY    . KIDNEY STONE SURGERY     lithortripsy    Family History: Family History  Problem Relation Age of Onset  . Hypertension Other   . Diabetes Father   . Healthy Mother     Social History:  reports that he has quit smoking. His smoking use included cigarettes. He has never used smokeless tobacco. He reports current alcohol use. He reports that he does not use drugs.  Physical Exam: BP 134/87 (BP Location: Left Arm, Patient Position: Sitting, Cuff Size: Large)   Pulse (!) 106   Ht 5\' 11"  (1.803 m)   Wt 253 lb 12.8 oz (115.1 kg)   BMI 35.40 kg/m    Constitutional:  Alert and oriented, No acute  distress. Cardiovascular: No clubbing, cyanosis, or edema. Respiratory: Normal respiratory effort, no increased work of breathing. GI: Abdomen is soft, nontender, nondistended, no abdominal masses GU: Left CVA tenderness  Laboratory Data: Reviewed, see HPI  Pertinent Imaging: I have personally viewed and interpreted the CT stone protocol dated 06/09/2020 that shows a 5 mm left UPJ stone without hydronephrosis. 900HU, 12cm SSD. KUB today confirms location of stone and is clearly seen.  Assessment & Plan:   30 year old male with recurrent stone disease and 5 mm left renal pelvis stone at the UPJ likely causing intermittent obstruction and hematuria.  No clinical or laboratory signs of infection.  Has had successful shockwave lithotripsy twice previously, and he would like to pursue this again.  We discussed various treatment options for urolithiasis including observation with or without medical expulsive therapy, shockwave lithotripsy (SWL), ureteroscopy and laser lithotripsy with stent placement, and percutaneous nephrolithotomy.  We discussed that management is based on stone size, location, density, patient co-morbidities, and patient preference.   Stones <65mm in size have a >80% spontaneous passage rate. Data surrounding the use of tamsulosin for medical expulsive therapy is controversial, but meta analyses suggests it is most efficacious for distal stones between 5-90mm in size. Possible side effects include dizziness/lightheadedness, and retrograde ejaculation.  SWL has a lower stone free rate in a single procedure,  but also a lower complication rate compared to ureteroscopy and avoids a stent and associated stent related symptoms. Possible complications include renal hematoma, steinstrasse, and need for additional treatment.  Ureteroscopy with laser lithotripsy and stent placement has a higher stone free rate than SWL in a single procedure, however increased complication rate including  possible infection, ureteral injury, bleeding, and stent related morbidity. Common stent related symptoms include dysuria, urgency/frequency, and flank pain.  He opts for shockwave lithotripsy, will schedule next week Norco sent to pharmacy Will need metabolic follow-up with his recurrent stone disease at young age  Legrand Rams, MD 06/15/2020  [facility] [address] [place], [place] [address] [phone]

## 2020-06-15 NOTE — H&P (View-Only) (Signed)
 06/15/20 4:57 PM   Jacob L Brouse Jr. 04/07/1991 [id number]  CC: Left flank pain  HPI: I saw Jacob Nixon in urology clinic for 1 week of left flank pain.  He is a 29-year-old male with multiple prior kidney stones, including lithotripsy x2 who reports 1 week of left-sided flank and groin pain and blood in the urine with similar symptoms to prior stone episodes.  He was seen in the ED on 06/09/2020 and CT showed a 5 mm left UPJ stone without hydronephrosis, and this was read as a nonobstructing stone.  On my review this appears to be just above the UPJ, and the likely etiology of his symptoms.  He reports ongoing left-sided flank and groin pain.  Urinalysis today consistent with a partially obstructing stone with 0-5 WBCs, 11-30 RBCs, few bacteria, no yeast, nitrite negative, no leukocytes.  He denies any fevers or chills.  He denies any urinary symptoms.  He has a strong family history of kidney stones.   PMH: Past Medical History:  Diagnosis Date  . Bronchitis   . Cellulitis of right hand - RECURRENT   . Kidney stones     Surgical History: Past Surgical History:  Procedure Laterality Date  . CARPAL TUNNEL RELEASE Left 04/09/2016   Procedure: CARPAL TUNNEL RELEASE;  Surgeon: Scott Gregory [name], MD;  Location: [facility] OR;  Service: Orthopedics;  Laterality: Left;  . HAND SURGERY    . KIDNEY STONE SURGERY     lithortripsy    Family History: Family History  Problem Relation Age of Onset  . Hypertension Other   . Diabetes Father   . Healthy Mother     Social History:  reports that he has quit smoking. His smoking use included cigarettes. He has never used smokeless tobacco. He reports current alcohol use. He reports that he does not use drugs.  Physical Exam: BP 134/87 (BP Location: Left Arm, Patient Position: Sitting, Cuff Size: Large)   Pulse (!) 106   Ht 5' 11" (1.803 m)   Wt 253 lb 12.8 oz (115.1 kg)   BMI 35.40 kg/m    Constitutional:  Alert and oriented, No acute  distress. Cardiovascular: No clubbing, cyanosis, or edema. Respiratory: Normal respiratory effort, no increased work of breathing. GI: Abdomen is soft, nontender, nondistended, no abdominal masses GU: Left CVA tenderness  Laboratory Data: Reviewed, see HPI  Pertinent Imaging: I have personally viewed and interpreted the CT stone protocol dated 06/09/2020 that shows a 5 mm left UPJ stone without hydronephrosis. 900HU, 12cm SSD. KUB today confirms location of stone and is clearly seen.  Assessment & Plan:   29-year-old male with recurrent stone disease and 5 mm left renal pelvis stone at the UPJ likely causing intermittent obstruction and hematuria.  No clinical or laboratory signs of infection.  Has had successful shockwave lithotripsy twice previously, and he would like to pursue this again.  We discussed various treatment options for urolithiasis including observation with or without medical expulsive therapy, shockwave lithotripsy (SWL), ureteroscopy and laser lithotripsy with stent placement, and percutaneous nephrolithotomy.  We discussed that management is based on stone size, location, density, patient co-morbidities, and patient preference.   Stones <5mm in size have a >80% spontaneous passage rate. Data surrounding the use of tamsulosin for medical expulsive therapy is controversial, but meta analyses suggests it is most efficacious for distal stones between 5-10mm in size. Possible side effects include dizziness/lightheadedness, and retrograde ejaculation.  SWL has a lower stone free rate in a single procedure,   but also a lower complication rate compared to ureteroscopy and avoids a stent and associated stent related symptoms. Possible complications include renal hematoma, steinstrasse, and need for additional treatment.  Ureteroscopy with laser lithotripsy and stent placement has a higher stone free rate than SWL in a single procedure, however increased complication rate including  possible infection, ureteral injury, bleeding, and stent related morbidity. Common stent related symptoms include dysuria, urgency/frequency, and flank pain.  He opts for shockwave lithotripsy, will schedule next week Norco sent to pharmacy Will need metabolic follow-up with his recurrent stone disease at young age  Legrand Rams, MD 06/15/2020  [facility] [address] [place], [place] [address] [phone]

## 2020-06-15 NOTE — Patient Instructions (Signed)
[employer] (25th ed., pp. 811-816). [place], [place]: [employer], Else[employer]. Retrieved from https://www.clinicalkey.com/#!/content/book/[email or web address]-[email or web address]?[email or web address]">  Lithotripsy  Lithotripsy is a treatment that can help break up kidney stones that are too large to pass on their own. This is a nonsurgical procedure that crushes a kidney stone with shock waves. These shock waves pass through your body and focus on the kidney stone. They cause the kidney stone to break up into smaller pieces while it is still in the urinary tract. The smaller pieces of stone can pass more easily out of your body in the urine. Tell a health care provider about:  Any allergies you have.  All medicines you are taking, including vitamins, herbs, eye drops, creams, and over-the-counter medicines.  Any problems you or family members have had with anesthetic medicines.  Any blood disorders you have.  Any surgeries you have had.  Any medical conditions you have.  Whether you are pregnant or may be pregnant. What are the risks? Generally, this is a safe procedure. However, problems may occur, including:  Infection.  Bleeding from the kidney.  Bruising of the kidney or skin.  Scarring of the kidney, which can lead to: ? Increased blood pressure. ? Poor kidney function. ? Return (recurrence) of kidney stones.  Damage to other structures or organs, such as the liver, colon, spleen, or pancreas.  Blockage (obstruction) of the tube that carries urine from the kidney to the bladder (ureter).  Failure of the kidney stone to break into pieces (fragments). What happens before the procedure? Staying hydrated Follow instructions from your health care provider about hydration, which may include:  Up to 2 hours before the procedure - you may continue to drink clear liquids, such as water, clear fruit juice, black coffee, and plain tea. Eating and drinking restrictions Follow  instructions from your health care provider about eating and drinking, which may include:  8 hours before the procedure - stop eating heavy meals or foods, such as meat, fried foods, or fatty foods.  6 hours before the procedure - stop eating light meals or foods, such as toast or cereal.  6 hours before the procedure - stop drinking milk or drinks that contain milk.  2 hours before the procedure - stop drinking clear liquids. Medicines Ask your health care provider about:  Changing or stopping your regular medicines. This is especially important if you are taking diabetes medicines or blood thinners.  Taking medicines such as aspirin and ibuprofen. These medicines can thin your blood. Do not take these medicines unless your health care provider tells you to take them.  Taking over-the-counter medicines, vitamins, herbs, and supplements. Tests You may have tests, such as:  Blood tests.  Urine tests.  Imaging tests, such as a CT scan. General instructions  Plan to have someone take you home from the hospital or clinic.  If you will be going home right after the procedure, plan to have someone with you for 24 hours.  Ask your health care provider what steps will be taken to help prevent infection. These may include washing skin with a germ-killing soap. What happens during the procedure?  An IV will be inserted into one of your veins.  You will be given one or more of the following: ? A medicine to help you relax (sedative). ? A medicine to make you fall asleep (general anesthetic).  A water-filled cushion may be placed behind your kidney or on your abdomen. In some cases, you may be placed in a tub of   lukewarm water.  Your body will be positioned in a way that makes it easy to target the kidney stone.  An X-ray or ultrasound exam will be done to locate your stone.  Shock waves will be aimed at the stone. If you are awake, you may feel a tapping sensation as the shock  waves pass through your body.  A flexible tube with holes in it (stent) may be placed in the ureter. This will help keep urine flowing from the kidney if the fragments of the stone have been blocking the ureter. The procedure may vary among health care providers and hospitals.   What happens after the procedure?  You may have an X-ray to see whether the procedure was able to break up the kidney stone and how much of the stone has passed. If large stone fragments remain after treatment, you may need to have a second procedure at a later time.  Your blood pressure, heart rate, breathing rate, and blood oxygen level will be monitored until you leave the hospital or clinic.  You may be given antibiotics or pain medicine as needed.  If a stent was placed in your ureter during surgery, it may stay in place for a few weeks.  You may need to strain your urine to collect pieces of the kidney stone for testing.  You will need to drink plenty of water.  If you were given a sedative during the procedure, it can affect you for several hours. Do not drive or operate machinery until your health care provider says that it is safe. Summary  Lithotripsy is a treatment that can help break up kidney stones that are too large to pass on their own.  Lithotripsy is a nonsurgical procedure that crushes a kidney stone with shock waves.  Generally, this is a safe procedure. However, problems may occur, including damage to the kidney or other organs, infection, or obstruction of the tube that carries urine from the kidney to the bladder (ureter).  You may have a stent placed in your ureter to help drain your urine. This stent may stay in place for a few weeks.  After the procedure, you will need to drink plenty of water. You may be asked to strain your urine to collect pieces of the kidney stone for testing. This information is not intended to replace advice given to you by your health care provider. Make sure  you discuss any questions you have with your health care provider. Document Revised: 02/17/2019 Document Reviewed: 02/17/2019 Elsevier Patient Education  2021 Elsevier Inc.  Lithotripsy, Care After This sheet gives you information about how to care for yourself after your procedure. Your health care provider may also give you more specific instructions. If you have problems or questions, contact your health care provider. What can I expect after the procedure? After the procedure, it is common to have:  Some blood in your urine. This should only last for a few days.  Soreness in your back, sides, or upper abdomen for a few days.  Blotches or bruises on the area where the shock wave entered the skin.  Pain, discomfort, or nausea when pieces (fragments) of the kidney stone move through the tube that carries urine from the kidney to the bladder (ureter). Stone fragments may pass soon after the procedure, but they may continue to pass for up to 4-8 weeks. ? If you have severe pain or nausea, contact your health care provider. This may be caused by a large   stone that was not broken up, and this may mean that you need more treatment.  Some pain or discomfort during urination.  Some pain or discomfort in the lower abdomen or (in men) at the base of the penis. Follow these instructions at home: Medicines  Take over-the-counter and prescription medicines only as told by your health care provider.  If you were prescribed an antibiotic medicine, take it as told by your health care provider. Do not stop taking the antibiotic even if you start to feel better.  Ask your health care provider if the medicine prescribed to you requires you to avoid driving or using machinery. Eating and drinking  Drink enough fluid to keep your urine pale yellow. This helps any remaining pieces of the stone to pass. It can also help prevent new stones from forming.  Eat plenty of fresh fruits and  vegetables.  Follow instructions from your health care provider about eating or drinking restrictions. You may be instructed to: ? Reduce how much salt (sodium) you eat or drink. Check ingredients and nutrition facts on packaged foods and beverages to see how much sodium they contain. ? Reduce how much meat you eat.  Eat the recommended amount of calcium for your age and gender. Ask your health care provider how much calcium you should have.      General instructions  Get plenty of rest.  Return to your normal activities as told by your health care provider. Ask your health care provider what activities are safe for you. Most people can resume normal activities 1-2 days after the procedure.  If you were given a sedative during the procedure, it can affect you for several hours. Do not drive or operate machinery until your health care provider says that it is safe.  Your health care provider may direct you to lie in a certain position (postural drainage) and tap firmly (percuss) over your kidney area to help stone fragments pass. Follow instructions as told by your health care provider.  If directed, strain all urine through the strainer that was provided by your health care provider. ? Keep all fragments for your health care provider to see. Any stones that are found may be sent to a medical lab for examination. The stone may be as small as a grain of salt.  Keep all follow-up visits as told by your health care provider. This is important. Contact a health care provider if:  You have a fever or chills.  You have nausea that is severe or does not go away.  You have any of these urinary symptoms: ? Blood in your urine for longer than your health care provider told you to expect. ? Urine that smells bad or unusual. ? Feeling a strong urge to urinate after emptying your bladder. ? Pain or burning with urination that does not go away. ? Urinating more often than usual and this does not  go away.  You have a stent and it comes out. Get help right away if:  You have severe pain in your back, sides, or upper abdomen.  You have any of these urinary symptoms: ? Severe pain while urinating. ? More blood in your urine or having blood in your urine when you did not before. ? Passing blood clots in your urine. ? Passing only a small amount of urine or being unable to pass any urine at all.  You have severe nausea that leads to persistent vomiting.  You faint. Summary  After this   procedure, it is common to have some pain, discomfort, or nausea when pieces (fragments) of the kidney stone move through the tube that carries urine from the kidney to the bladder (ureter). If this pain or nausea is severe, however, you should contact your health care provider.  Return to your normal activities as told by your health care provider. Ask your health care provider what activities are safe for you.  Drink enough fluid to keep your urine pale yellow. This helps any remaining pieces of the stone to pass, and it can help prevent new stones from forming.  If directed, strain your urine and keep all fragments for your health care provider to see. Fragments or stones may be as small as a grain of salt.  Get help right away if you have severe pain in your back, sides, or upper abdomen, or if you have severe pain while urinating. This information is not intended to replace advice given to you by your health care provider. Make sure you discuss any questions you have with your health care provider. Document Revised: 02/17/2019 Document Reviewed: 02/17/2019 Elsevier Patient Education  2021 Elsevier Inc.  

## 2020-06-19 ENCOUNTER — Other Ambulatory Visit: Payer: Self-pay | Admitting: Radiology

## 2020-06-19 DIAGNOSIS — N200 Calculus of kidney: Secondary | ICD-10-CM

## 2020-06-20 LAB — CULTURE, URINE COMPREHENSIVE

## 2020-06-21 MED ORDER — DIAZEPAM 5 MG PO TABS: 10.0000 mg | ORAL_TABLET | ORAL | Status: AC

## 2020-06-21 MED ORDER — DIPHENHYDRAMINE HCL 25 MG PO CAPS: 25.0000 mg | ORAL_CAPSULE | ORAL | Status: AC

## 2020-06-21 MED ORDER — CIPROFLOXACIN IN D5W 400 MG/200ML IV SOLN: 400.0000 mg | INTRAVENOUS | Status: AC

## 2020-06-21 MED ORDER — SODIUM CHLORIDE 0.9 % IV SOLN: INTRAVENOUS | Status: DC

## 2020-06-22 ENCOUNTER — Encounter: Payer: Self-pay | Admitting: Urology

## 2020-06-22 ENCOUNTER — Ambulatory Visit
Admission: RE | Admit: 2020-06-22 | Discharge: 2020-06-22 | Disposition: A | Discharge status: Home / Self Care | Payer: BC Managed Care – PPO | Attending: Urology | Admitting: Urology

## 2020-06-22 ENCOUNTER — Ambulatory Visit: Payer: BC Managed Care – PPO

## 2020-06-22 ENCOUNTER — Other Ambulatory Visit: Payer: Self-pay

## 2020-06-22 ENCOUNTER — Encounter
Admission: RE | Disposition: A | Discharge status: Home / Self Care | Payer: Self-pay | Source: Home / Self Care | Attending: Urology

## 2020-06-22 DIAGNOSIS — N201 Calculus of ureter: Secondary | ICD-10-CM | POA: Diagnosis not present

## 2020-06-22 DIAGNOSIS — E669 Obesity, unspecified: Secondary | ICD-10-CM | POA: Insufficient documentation

## 2020-06-22 DIAGNOSIS — Z87891 Personal history of nicotine dependence: Secondary | ICD-10-CM | POA: Insufficient documentation

## 2020-06-22 DIAGNOSIS — N200 Calculus of kidney: Secondary | ICD-10-CM

## 2020-06-22 DIAGNOSIS — Z87442 Personal history of urinary calculi: Secondary | ICD-10-CM | POA: Insufficient documentation

## 2020-06-22 DIAGNOSIS — R109 Unspecified abdominal pain: Secondary | ICD-10-CM | POA: Diagnosis present

## 2020-06-22 HISTORY — PX: EXTRACORPOREAL SHOCK WAVE LITHOTRIPSY: SHX1557

## 2020-06-22 SURGERY — LITHOTRIPSY, ESWL
Anesthesia: Moderate Sedation | Laterality: Left

## 2020-06-22 MED ORDER — DIPHENHYDRAMINE HCL 25 MG PO CAPS
ORAL_CAPSULE | ORAL | Status: AC
  Administered 2020-06-22: 25 mg via ORAL
  Filled 2020-06-22: qty 1

## 2020-06-22 MED ORDER — OXYCODONE-ACETAMINOPHEN 5-325 MG PO TABS
ORAL_TABLET | ORAL | Status: AC
  Filled 2020-06-22: qty 2

## 2020-06-22 MED ORDER — KETOROLAC TROMETHAMINE 30 MG/ML IJ SOLN: 30.0000 mg | Freq: Once | INTRAMUSCULAR | Status: AC

## 2020-06-22 MED ORDER — HYDROCODONE-ACETAMINOPHEN 5-325 MG PO TABS: 1.0000 | ORAL_TABLET | Freq: Four times a day (QID) | ORAL | 0 refills | Status: DC | PRN

## 2020-06-22 MED ORDER — CIPROFLOXACIN IN D5W 400 MG/200ML IV SOLN
INTRAVENOUS | Status: AC
  Administered 2020-06-22: 400 mg via INTRAVENOUS
  Filled 2020-06-22: qty 200

## 2020-06-22 MED ORDER — TAMSULOSIN HCL 0.4 MG PO CAPS: 0.4000 mg | ORAL_CAPSULE | Freq: Every day | ORAL | 0 refills | Status: DC

## 2020-06-22 MED ORDER — KETOROLAC TROMETHAMINE 30 MG/ML IJ SOLN
INTRAMUSCULAR | Status: AC
  Administered 2020-06-22: 30 mg via INTRAVENOUS
  Filled 2020-06-22: qty 2

## 2020-06-22 MED ORDER — OXYCODONE-ACETAMINOPHEN 5-325 MG PO TABS
2.0000 | ORAL_TABLET | Freq: Once | ORAL | Status: AC
  Administered 2020-06-22: 2 via ORAL

## 2020-06-22 MED ORDER — DIAZEPAM 5 MG PO TABS
ORAL_TABLET | ORAL | Status: AC
  Administered 2020-06-22: 10 mg via ORAL
  Filled 2020-06-22: qty 2

## 2020-06-22 MED ORDER — ONDANSETRON HCL 4 MG/2ML IJ SOLN
INTRAMUSCULAR | Status: AC
  Filled 2020-06-22: qty 2

## 2020-06-22 NOTE — Progress Notes (Signed)
Arrived via wheelchair from the lithotripsy truck, pt crying uncontrollably. Pt stated pain is 10/10 or more in left flank area. Dr Apolinar Junes notified and toradol was ordered and given with no relief. Mother in with pt.

## 2020-06-22 NOTE — Discharge Instructions (Signed)
AMBULATORY SURGERY  DISCHARGE INSTRUCTIONS   1) The drugs that you were given will stay in your system until tomorrow so for the next 24 hours you should not:  A) Drive an automobile B) Make any legal decisions C) Drink any alcoholic beverage   2) You may resume regular meals tomorrow.  Today it is better to start with liquids and gradually work up to solid foods.  You may eat anything you prefer, but it is better to start with liquids, then soup and crackers, and gradually work up to solid foods.   3) Please notify your doctor immediately if you have any unusual bleeding, trouble breathing, redness and pain at the surgery site, drainage, fever, or pain not relieved by medication.    4) Additional Instructions:        Please contact your physician with any problems or Same Day Surgery at [phone], Monday through Friday 6 am to 4 pm, or [place] at [facility] number at [phone].  Kidney Stones  Kidney stones are solid, rock-like deposits that form inside of the kidneys. The kidneys are a pair of organs that make urine. A kidney stone may form in a kidney and move into other parts of the urinary tract, including the tubes that connect the kidneys to the bladder (ureters), the bladder, and the tube that carries urine out of the body (urethra). As the stone moves through these areas, it can cause intense pain and block the flow of urine. Kidney stones are created when high levels of certain minerals are found in the urine. The stones are usually passed out of the body through urination, but in some cases, medical treatment may be needed to remove them. What are the causes? Kidney stones may be caused by:  A condition in which certain glands produce too much parathyroid hormone (primary hyperparathyroidism), which causes too much calcium buildup in the blood.  A buildup of uric acid crystals in the bladder (hyperuricosuria). Uric acid is a chemical that the body  produces when you eat certain foods. It usually exits the body in the urine.  Narrowing (stricture) of one or both of the ureters.  A kidney blockage that is present at birth (congenital obstruction).  Past surgery on the kidney or the ureters, such as gastric bypass surgery. What increases the risk? The following factors may make you more likely to develop this condition:  Having had a kidney stone in the past.  Having a family history of kidney stones.  Not drinking enough water.  Eating a diet that is high in protein, salt (sodium), or sugar.  Being overweight or obese. What are the signs or symptoms? Symptoms of a kidney stone may include:  Pain in the side of the abdomen, right below the ribs (flank pain). Pain usually spreads (radiates) to the groin.  Needing to urinate frequently or urgently.  Painful urination.  Blood in the urine (hematuria).  Nausea.  Vomiting.  Fever and chills. How is this diagnosed? This condition may be diagnosed based on:  Your symptoms and medical history.  A physical exam.  Blood tests.  Urine tests. These may be done before and after the stone passes out of your body through urination.  Imaging tests, such as a CT scan, abdominal X-ray, or ultrasound.  A procedure to examine the inside of the bladder (cystoscopy). How is this treated? Treatment for kidney stones depends on the size, location, and makeup of the stones. Kidney stones will often pass out of  the body through urination. You may need to:  Increase your fluid intake to help pass the stone. In some cases, you may be given fluids through an IV and may need to be monitored at the hospital.  Take medicine for pain.  Make changes in your diet to help prevent kidney stones from coming back. Sometimes, medical procedures are needed to remove a kidney stone. This may involve:  A procedure to break up kidney stones using: ? A focused beam of light (laser  therapy). ? Shock waves (extracorporeal shock wave lithotripsy).  Surgery to remove kidney stones. This may be needed if you have severe pain or have stones that block your urinary tract. Follow these instructions at home: Medicines  Take over-the-counter and prescription medicines only as told by your health care provider.  Ask your health care provider if the medicine prescribed to you requires you to avoid driving or using heavy machinery. Eating and drinking  Drink enough fluid to keep your urine pale yellow. You may be instructed to drink at least 8-10 glasses of water each day. This will help you pass the kidney stone.  If directed, change your diet. This may include: ? Limiting how much sodium you eat. ? Eating more fruits and vegetables. ? Limiting how much animal protein--such as red meat, poultry, fish, and eggs--you eat.  Follow instructions from your health care provider about eating or drinking restrictions. General instructions  Collect urine samples as told by your health care provider. You may need to collect a urine sample: ? 24 hours after you pass the stone. ? 8-12 weeks after passing the kidney stone, and every 6-12 months after that.  Strain your urine every time you urinate, for as long as directed. Use the strainer that your health care provider recommends.  Do not throw out the kidney stone after passing it. Keep the stone so it can be tested by your health care provider. Testing the makeup of your kidney stone may help prevent you from getting kidney stones in the future.  Keep all follow-up visits as told by your health care provider. This is important. You may need follow-up X-rays or ultrasounds to make sure that your stone has passed. How is this prevented? To prevent another kidney stone:  Drink enough fluid to keep your urine pale yellow. This is the best way to prevent kidney stones.  Eat a healthy diet and follow recommendations from your health  care provider about foods to avoid. You may be instructed to eat a low-protein diet. Recommendations vary depending on the type of kidney stone that you have.  Maintain a healthy weight.   Where to find more information  National Kidney Foundation (NKF): www.kidney.org  [facility]): www.urologyhealth.org Contact a health care provider if:  You have pain that gets worse or does not get better with medicine. Get help right away if:  You have a fever or chills.  You develop severe pain.  You develop new abdominal pain.  You faint.  You are unable to urinate. Summary  Kidney stones are solid, rock-like deposits that form inside of the kidneys.  Kidney stones can cause nausea, vomiting, blood in the urine, abdominal pain, and the urge to urinate frequently.  Treatment for kidney stones depends on the size, location, and makeup of the stones. Kidney stones will often pass out of the body through urination.  Kidney stones can be prevented by drinking enough fluids, eating a healthy diet, and maintaining a healthy  weight. This information is not intended to replace advice given to you by your health care provider. Make sure you discuss any questions you have with your health care provider. Document Revised: 09/22/2018 Document Reviewed: 09/22/2018 Elsevier Patient Education  2021 Elsevier Inc. See [facility] discharge instructions in chart.

## 2020-06-22 NOTE — Interval H&P Note (Signed)
History and Physical Interval Note:  06/22/2020 7:34 AM  Jacob Nixon.  has presented today for surgery, with the diagnosis of Kidney stone.  The various methods of treatment have been discussed with the patient and family. After consideration of risks, benefits and other options for treatment, the patient has consented to  Procedure(s): EXTRACORPOREAL SHOCK WAVE LITHOTRIPSY (ESWL) (Left) as a surgical intervention.  The patient's history has been reviewed, patient examined, no change in status, stable for surgery.  I have reviewed the patient's chart and labs.  Questions were answered to the patient's satisfaction.    RRR CTAB   Vanna Scotland

## 2020-07-01 ENCOUNTER — Encounter: Payer: Self-pay | Admitting: Emergency Medicine

## 2020-07-01 ENCOUNTER — Other Ambulatory Visit: Payer: Self-pay

## 2020-07-01 DIAGNOSIS — N202 Calculus of kidney with calculus of ureter: Secondary | ICD-10-CM | POA: Diagnosis not present

## 2020-07-01 DIAGNOSIS — N136 Pyonephrosis: Secondary | ICD-10-CM | POA: Diagnosis present

## 2020-07-01 DIAGNOSIS — Z202 Contact with and (suspected) exposure to infections with a predominantly sexual mode of transmission: Secondary | ICD-10-CM | POA: Diagnosis present

## 2020-07-01 DIAGNOSIS — Z880 Allergy status to penicillin: Secondary | ICD-10-CM

## 2020-07-01 DIAGNOSIS — R6510 Systemic inflammatory response syndrome (SIRS) of non-infectious origin without acute organ dysfunction: Secondary | ICD-10-CM | POA: Diagnosis present

## 2020-07-01 DIAGNOSIS — Z87891 Personal history of nicotine dependence: Secondary | ICD-10-CM

## 2020-07-01 DIAGNOSIS — Z91018 Allergy to other foods: Secondary | ICD-10-CM

## 2020-07-01 DIAGNOSIS — Z87442 Personal history of urinary calculi: Secondary | ICD-10-CM

## 2020-07-01 DIAGNOSIS — N3289 Other specified disorders of bladder: Secondary | ICD-10-CM | POA: Diagnosis not present

## 2020-07-01 DIAGNOSIS — Z79899 Other long term (current) drug therapy: Secondary | ICD-10-CM

## 2020-07-01 DIAGNOSIS — R109 Unspecified abdominal pain: Secondary | ICD-10-CM | POA: Diagnosis not present

## 2020-07-01 DIAGNOSIS — Z8616 Personal history of COVID-19: Secondary | ICD-10-CM

## 2020-07-01 DIAGNOSIS — Z20822 Contact with and (suspected) exposure to covid-19: Secondary | ICD-10-CM | POA: Diagnosis present

## 2020-07-01 NOTE — ED Triage Notes (Addendum)
Pt presents with [facility] with c/o left flank flank pain radiating to the front. Pt was recently had lithotripsy on his left kidney on the 3 and has a hx of stones. Pt reported to EMS that he had just taken two of his hydrocodone. Pt reports not eating today or being able to use the restroom. Per EMS VS were BP 100/60, HR 93, RR 22, 98% RA, CBG 147, and temp was 97.3. Pt reports that this is the worst pain that he has ever been in.

## 2020-07-02 ENCOUNTER — Inpatient Hospital Stay: Payer: BC Managed Care – PPO

## 2020-07-02 ENCOUNTER — Inpatient Hospital Stay: Payer: BC Managed Care – PPO | Admitting: Certified Registered Nurse Anesthetist

## 2020-07-02 ENCOUNTER — Encounter
Admission: EM | Disposition: A | Discharge status: Home / Self Care | Payer: Self-pay | Source: Home / Self Care | Attending: Family Medicine

## 2020-07-02 ENCOUNTER — Emergency Department: Payer: BC Managed Care – PPO

## 2020-07-02 ENCOUNTER — Inpatient Hospital Stay
Admission: EM | Admit: 2020-07-02 | Discharge: 2020-07-07 | DRG: 660 | Disposition: A | Discharge status: Home / Self Care | Payer: BC Managed Care – PPO | Attending: Family Medicine | Admitting: Family Medicine

## 2020-07-02 DIAGNOSIS — Z79899 Other long term (current) drug therapy: Secondary | ICD-10-CM | POA: Diagnosis not present

## 2020-07-02 DIAGNOSIS — R319 Hematuria, unspecified: Secondary | ICD-10-CM | POA: Diagnosis not present

## 2020-07-02 DIAGNOSIS — Z202 Contact with and (suspected) exposure to infections with a predominantly sexual mode of transmission: Secondary | ICD-10-CM | POA: Diagnosis present

## 2020-07-02 DIAGNOSIS — N139 Obstructive and reflux uropathy, unspecified: Secondary | ICD-10-CM | POA: Diagnosis present

## 2020-07-02 DIAGNOSIS — A419 Sepsis, unspecified organism: Secondary | ICD-10-CM | POA: Diagnosis not present

## 2020-07-02 DIAGNOSIS — N3289 Other specified disorders of bladder: Secondary | ICD-10-CM | POA: Diagnosis not present

## 2020-07-02 DIAGNOSIS — R52 Pain, unspecified: Secondary | ICD-10-CM

## 2020-07-02 DIAGNOSIS — R1032 Left lower quadrant pain: Secondary | ICD-10-CM | POA: Diagnosis not present

## 2020-07-02 DIAGNOSIS — Z20822 Contact with and (suspected) exposure to covid-19: Secondary | ICD-10-CM | POA: Diagnosis present

## 2020-07-02 DIAGNOSIS — Z87442 Personal history of urinary calculi: Secondary | ICD-10-CM | POA: Diagnosis not present

## 2020-07-02 DIAGNOSIS — Z87891 Personal history of nicotine dependence: Secondary | ICD-10-CM | POA: Diagnosis not present

## 2020-07-02 DIAGNOSIS — R112 Nausea with vomiting, unspecified: Secondary | ICD-10-CM | POA: Diagnosis not present

## 2020-07-02 DIAGNOSIS — D72829 Elevated white blood cell count, unspecified: Secondary | ICD-10-CM | POA: Diagnosis not present

## 2020-07-02 DIAGNOSIS — R109 Unspecified abdominal pain: Secondary | ICD-10-CM | POA: Diagnosis present

## 2020-07-02 DIAGNOSIS — Z91018 Allergy to other foods: Secondary | ICD-10-CM | POA: Diagnosis not present

## 2020-07-02 DIAGNOSIS — N200 Calculus of kidney: Secondary | ICD-10-CM

## 2020-07-02 DIAGNOSIS — Z8616 Personal history of COVID-19: Secondary | ICD-10-CM | POA: Diagnosis not present

## 2020-07-02 DIAGNOSIS — N132 Hydronephrosis with renal and ureteral calculous obstruction: Secondary | ICD-10-CM | POA: Diagnosis not present

## 2020-07-02 DIAGNOSIS — N136 Pyonephrosis: Secondary | ICD-10-CM | POA: Diagnosis present

## 2020-07-02 DIAGNOSIS — N202 Calculus of kidney with calculus of ureter: Secondary | ICD-10-CM | POA: Diagnosis present

## 2020-07-02 DIAGNOSIS — N10 Acute tubulo-interstitial nephritis: Secondary | ICD-10-CM | POA: Diagnosis not present

## 2020-07-02 DIAGNOSIS — N201 Calculus of ureter: Secondary | ICD-10-CM | POA: Diagnosis not present

## 2020-07-02 DIAGNOSIS — N23 Unspecified renal colic: Secondary | ICD-10-CM

## 2020-07-02 DIAGNOSIS — Z880 Allergy status to penicillin: Secondary | ICD-10-CM | POA: Diagnosis not present

## 2020-07-02 DIAGNOSIS — R6510 Systemic inflammatory response syndrome (SIRS) of non-infectious origin without acute organ dysfunction: Secondary | ICD-10-CM | POA: Diagnosis present

## 2020-07-02 HISTORY — PX: CYSTOSCOPY WITH RETROGRADE PYELOGRAM, URETEROSCOPY AND STENT PLACEMENT: SHX5789

## 2020-07-02 LAB — CBC
HCT: 41.2 % (ref 39.0–52.0)
Hemoglobin: 13.8 g/dL (ref 13.0–17.0)
MCH: 28.5 pg (ref 26.0–34.0)
MCHC: 33.5 g/dL (ref 30.0–36.0)
MCV: 85.1 fL (ref 80.0–100.0)
Platelets: 309 10*3/uL (ref 150–400)
RBC: 4.84 MIL/uL (ref 4.22–5.81)
RDW: 13.5 % (ref 11.5–15.5)
WBC: 13.3 10*3/uL — ABNORMAL HIGH (ref 4.0–10.5)
nRBC: 0 % (ref 0.0–0.2)

## 2020-07-02 LAB — BASIC METABOLIC PANEL
Anion gap: 9 (ref 5–15)
BUN: 12 mg/dL (ref 6–20)
CO2: 24 mmol/L (ref 22–32)
Calcium: 9 mg/dL (ref 8.9–10.3)
Chloride: 103 mmol/L (ref 98–111)
Creatinine, Ser: 0.99 mg/dL (ref 0.61–1.24)
GFR, Estimated: >60 mL/min (ref >60)
Glucose, Bld: 111 mg/dL — ABNORMAL HIGH (ref 70–99)
Potassium: 3.8 mmol/L (ref 3.5–5.1)
Sodium: 136 mmol/L (ref 135–145)

## 2020-07-02 LAB — URINALYSIS, COMPLETE (UACMP) WITH MICROSCOPIC
Bilirubin Urine: NEGATIVE
Glucose, UA: NEGATIVE mg/dL
Ketones, ur: NEGATIVE mg/dL
Nitrite: NEGATIVE
Protein, ur: NEGATIVE mg/dL
Specific Gravity, Urine: 1.013 (ref 1.005–1.030)
Squamous Epithelial / HPF: NONE SEEN (ref 0–5)
pH: 6 (ref 5.0–8.0)

## 2020-07-02 LAB — PROCALCITONIN: Procalcitonin: <0.1 ng/mL

## 2020-07-02 LAB — RESP PANEL BY RT-PCR (FLU A&B, COVID) ARPGX2
Influenza A by PCR: NEGATIVE
Influenza B by PCR: NEGATIVE
SARS Coronavirus 2 by RT PCR: NEGATIVE

## 2020-07-02 LAB — PROTIME-INR
INR: 1.1 (ref 0.8–1.2)
Prothrombin Time: 13.5 seconds (ref 11.4–15.2)

## 2020-07-02 LAB — LACTIC ACID, PLASMA: Lactic Acid, Venous: 1.1 mmol/L (ref 0.5–1.9)

## 2020-07-02 LAB — APTT: aPTT: 27 seconds (ref 24–36)

## 2020-07-02 SURGERY — CYSTOURETEROSCOPY, WITH RETROGRADE PYELOGRAM AND STENT INSERTION
Anesthesia: General | Laterality: Bilateral

## 2020-07-02 MED ORDER — MORPHINE SULFATE (PF) 4 MG/ML IV SOLN
4.0000 mg | Freq: Once | INTRAVENOUS | Status: AC
Start: 2020-07-02 — End: 2020-07-02
  Administered 2020-07-02: 4 mg via INTRAVENOUS
  Filled 2020-07-02: qty 1

## 2020-07-02 MED ORDER — ONDANSETRON HCL 4 MG/2ML IJ SOLN: 4.0000 mg | Freq: Once | INTRAMUSCULAR | Status: DC | PRN

## 2020-07-02 MED ORDER — PROPOFOL 10 MG/ML IV BOLUS
INTRAVENOUS | Status: AC
  Filled 2020-07-02: qty 20

## 2020-07-02 MED ORDER — DOXYCYCLINE HYCLATE 100 MG PO TABS
100.0000 mg | ORAL_TABLET | Freq: Two times a day (BID) | ORAL | Status: DC
  Administered 2020-07-02 – 2020-07-07 (×10): 100 mg via ORAL
  Filled 2020-07-02 (×10): qty 1

## 2020-07-02 MED ORDER — ONDANSETRON HCL 4 MG/2ML IJ SOLN
4.0000 mg | Freq: Once | INTRAMUSCULAR | Status: AC
  Administered 2020-07-02: 4 mg via INTRAVENOUS
  Filled 2020-07-02: qty 2

## 2020-07-02 MED ORDER — DOXYCYCLINE HYCLATE 100 MG PO TABS
100.0000 mg | ORAL_TABLET | Freq: Once | ORAL | Status: AC
  Administered 2020-07-02: 100 mg via ORAL
  Filled 2020-07-02: qty 1

## 2020-07-02 MED ORDER — SODIUM CHLORIDE 0.9 % IV BOLUS
1000.0000 mL | Freq: Once | INTRAVENOUS | Status: AC
  Administered 2020-07-02: 1000 mL via INTRAVENOUS

## 2020-07-02 MED ORDER — EPHEDRINE 5 MG/ML INJ
INTRAVENOUS | Status: AC
  Filled 2020-07-02: qty 10

## 2020-07-02 MED ORDER — FENTANYL CITRATE (PF) 100 MCG/2ML IJ SOLN
INTRAMUSCULAR | Status: AC
  Administered 2020-07-02: 25 ug via INTRAVENOUS
  Filled 2020-07-02: qty 2

## 2020-07-02 MED ORDER — IOHEXOL 180 MG/ML  SOLN
INTRAMUSCULAR | Status: DC | PRN
  Administered 2020-07-02: 5 mL

## 2020-07-02 MED ORDER — SODIUM CHLORIDE 0.9 % IR SOLN
Status: DC | PRN
  Administered 2020-07-02: 3000 mL

## 2020-07-02 MED ORDER — FENTANYL CITRATE (PF) 100 MCG/2ML IJ SOLN
INTRAMUSCULAR | Status: DC | PRN
  Administered 2020-07-02: 50 ug via INTRAVENOUS

## 2020-07-02 MED ORDER — LACTATED RINGERS IV BOLUS
500.0000 mL | Freq: Once | INTRAVENOUS | Status: AC
  Administered 2020-07-02: 500 mL via INTRAVENOUS

## 2020-07-02 MED ORDER — EPHEDRINE SULFATE 50 MG/ML IJ SOLN
INTRAMUSCULAR | Status: DC | PRN
  Administered 2020-07-02: 5 mg via INTRAVENOUS

## 2020-07-02 MED ORDER — SODIUM CHLORIDE 0.9 % IV SOLN
1.0000 g | Freq: Once | INTRAVENOUS | Status: AC
  Administered 2020-07-02: 1 g via INTRAVENOUS
  Filled 2020-07-02: qty 10

## 2020-07-02 MED ORDER — LIDOCAINE HCL (CARDIAC) PF 100 MG/5ML IV SOSY
PREFILLED_SYRINGE | INTRAVENOUS | Status: DC | PRN
  Administered 2020-07-02: 100 mg via INTRAVENOUS

## 2020-07-02 MED ORDER — FENTANYL CITRATE (PF) 100 MCG/2ML IJ SOLN
INTRAMUSCULAR | Status: AC
  Filled 2020-07-02: qty 2

## 2020-07-02 MED ORDER — FENTANYL CITRATE (PF) 100 MCG/2ML IJ SOLN
50.0000 ug | INTRAMUSCULAR | Status: DC | PRN
  Administered 2020-07-02 (×2): 50 ug via INTRAVENOUS
  Filled 2020-07-02 (×2): qty 2

## 2020-07-02 MED ORDER — DEXAMETHASONE SODIUM PHOSPHATE 10 MG/ML IJ SOLN
INTRAMUSCULAR | Status: AC
  Filled 2020-07-02: qty 1

## 2020-07-02 MED ORDER — SODIUM CHLORIDE 0.9 % IV SOLN: 1.0000 g | Freq: Once | INTRAVENOUS | Status: DC

## 2020-07-02 MED ORDER — TAMSULOSIN HCL 0.4 MG PO CAPS
0.4000 mg | ORAL_CAPSULE | Freq: Once | ORAL | Status: AC
  Administered 2020-07-02: 0.4 mg via ORAL
  Filled 2020-07-02: qty 1

## 2020-07-02 MED ORDER — ONDANSETRON HCL 4 MG/2ML IJ SOLN
4.0000 mg | Freq: Three times a day (TID) | INTRAMUSCULAR | Status: DC | PRN
  Administered 2020-07-03 – 2020-07-06 (×3): 4 mg via INTRAVENOUS
  Filled 2020-07-02 (×5): qty 2

## 2020-07-02 MED ORDER — DEXAMETHASONE SODIUM PHOSPHATE 10 MG/ML IJ SOLN
INTRAMUSCULAR | Status: DC | PRN
  Administered 2020-07-02: 10 mg via INTRAVENOUS

## 2020-07-02 MED ORDER — MIDAZOLAM HCL 2 MG/2ML IJ SOLN
INTRAMUSCULAR | Status: AC
  Filled 2020-07-02: qty 2

## 2020-07-02 MED ORDER — ACETAMINOPHEN 325 MG PO TABS: 650.0000 mg | ORAL_TABLET | Freq: Four times a day (QID) | ORAL | Status: DC | PRN

## 2020-07-02 MED ORDER — ONDANSETRON HCL 4 MG/2ML IJ SOLN
INTRAMUSCULAR | Status: AC
  Filled 2020-07-02: qty 2

## 2020-07-02 MED ORDER — CLINDAMYCIN PHOSPHATE 900 MG/50ML IV SOLN
INTRAVENOUS | Status: AC
  Filled 2020-07-02: qty 50

## 2020-07-02 MED ORDER — CLINDAMYCIN PHOSPHATE 900 MG/50ML IV SOLN
INTRAVENOUS | Status: DC | PRN
  Administered 2020-07-02: 900 mg via INTRAVENOUS

## 2020-07-02 MED ORDER — FENTANYL CITRATE (PF) 100 MCG/2ML IJ SOLN
25.0000 ug | INTRAMUSCULAR | Status: AC | PRN
  Administered 2020-07-02 (×6): 25 ug via INTRAVENOUS

## 2020-07-02 MED ORDER — DOXYCYCLINE HYCLATE 100 MG PO CAPS: 100.0000 mg | ORAL_CAPSULE | Freq: Two times a day (BID) | ORAL | 0 refills | Status: DC

## 2020-07-02 MED ORDER — MIDAZOLAM HCL 2 MG/2ML IJ SOLN
INTRAMUSCULAR | Status: DC | PRN
  Administered 2020-07-02: 2 mg via INTRAVENOUS

## 2020-07-02 MED ORDER — LACTATED RINGERS IV SOLN: INTRAVENOUS | Status: DC

## 2020-07-02 MED ORDER — TRAZODONE HCL 50 MG PO TABS: 25.0000 mg | ORAL_TABLET | Freq: Every evening | ORAL | Status: DC | PRN

## 2020-07-02 MED ORDER — ONDANSETRON 4 MG PO TBDP: 4.0000 mg | ORAL_TABLET | Freq: Three times a day (TID) | ORAL | 0 refills | Status: DC | PRN

## 2020-07-02 MED ORDER — OXYCODONE-ACETAMINOPHEN 5-325 MG PO TABS
1.0000 | ORAL_TABLET | ORAL | Status: DC | PRN
  Administered 2020-07-03 – 2020-07-05 (×8): 1 via ORAL
  Filled 2020-07-02 (×10): qty 1

## 2020-07-02 MED ORDER — CEPHALEXIN 500 MG PO CAPS: 500.0000 mg | ORAL_CAPSULE | Freq: Two times a day (BID) | ORAL | 0 refills | Status: DC

## 2020-07-02 MED ORDER — SUCCINYLCHOLINE CHLORIDE 200 MG/10ML IV SOSY
PREFILLED_SYRINGE | INTRAVENOUS | Status: AC
  Filled 2020-07-02: qty 10

## 2020-07-02 MED ORDER — TAMSULOSIN HCL 0.4 MG PO CAPS: 0.4000 mg | ORAL_CAPSULE | Freq: Every day | ORAL | 0 refills | Status: DC

## 2020-07-02 MED ORDER — ACETAMINOPHEN 10 MG/ML IV SOLN
INTRAVENOUS | Status: DC | PRN
  Administered 2020-07-02: 1000 mg via INTRAVENOUS

## 2020-07-02 MED ORDER — LIDOCAINE HCL (PF) 2 % IJ SOLN
INTRAMUSCULAR | Status: AC
  Filled 2020-07-02: qty 5

## 2020-07-02 MED ORDER — ONDANSETRON HCL 4 MG/2ML IJ SOLN
INTRAMUSCULAR | Status: DC | PRN
  Administered 2020-07-02: 4 mg via INTRAVENOUS

## 2020-07-02 MED ORDER — PROPOFOL 10 MG/ML IV BOLUS
INTRAVENOUS | Status: DC | PRN
  Administered 2020-07-02: 180 mg via INTRAVENOUS

## 2020-07-02 MED ORDER — LACTATED RINGERS IV BOLUS
1000.0000 mL | Freq: Once | INTRAVENOUS | Status: AC
  Administered 2020-07-02: 1000 mL via INTRAVENOUS

## 2020-07-02 MED ORDER — HYDROMORPHONE HCL 1 MG/ML IJ SOLN
1.0000 mg | INTRAMUSCULAR | Status: DC | PRN
  Administered 2020-07-02 – 2020-07-03 (×4): 1 mg via INTRAVENOUS
  Filled 2020-07-02 (×4): qty 1

## 2020-07-02 MED ORDER — ACETAMINOPHEN 10 MG/ML IV SOLN
INTRAVENOUS | Status: AC
  Filled 2020-07-02: qty 100

## 2020-07-02 MED ORDER — KETOROLAC TROMETHAMINE 30 MG/ML IJ SOLN
15.0000 mg | Freq: Once | INTRAMUSCULAR | Status: AC
  Administered 2020-07-02: 15 mg via INTRAVENOUS
  Filled 2020-07-02: qty 1

## 2020-07-02 MED ORDER — HYDROMORPHONE HCL 1 MG/ML IJ SOLN
1.0000 mg | Freq: Once | INTRAMUSCULAR | Status: AC
  Administered 2020-07-02: 1 mg via INTRAVENOUS
  Filled 2020-07-02: qty 1

## 2020-07-02 MED ORDER — OXYCODONE HCL 5 MG PO TABS
5.0000 mg | ORAL_TABLET | Freq: Once | ORAL | Status: AC
  Administered 2020-07-02: 5 mg via ORAL
  Filled 2020-07-02: qty 1

## 2020-07-02 MED ORDER — DEXTROSE 5 % IV SOLN: 500.0000 mg | Freq: Once | INTRAVENOUS | Status: DC

## 2020-07-02 MED ORDER — SODIUM CHLORIDE 0.9 % IV SOLN
2.0000 g | INTRAVENOUS | Status: DC
  Administered 2020-07-03 – 2020-07-04 (×2): 2 g via INTRAVENOUS
  Filled 2020-07-02: qty 2
  Filled 2020-07-02: qty 20
  Filled 2020-07-02: qty 2

## 2020-07-02 MED ORDER — ACETAMINOPHEN 500 MG PO TABS
1000.0000 mg | ORAL_TABLET | Freq: Once | ORAL | Status: AC
  Administered 2020-07-02: 1000 mg via ORAL
  Filled 2020-07-02: qty 2

## 2020-07-02 MED ORDER — TAMSULOSIN HCL 0.4 MG PO CAPS: 0.4000 mg | ORAL_CAPSULE | Freq: Every day | ORAL | Status: DC

## 2020-07-02 SURGICAL SUPPLY — 18 items
BAG DRAIN CYSTO-URO LG1000N (MISCELLANEOUS) ×2 IMPLANT
BRUSH SCRUB EZ 1% IODOPHOR (MISCELLANEOUS) ×2 IMPLANT
CATH URETL 5X70 OPEN END (CATHETERS) ×3 IMPLANT
DRAPE UTILITY 15X26 TOWEL STRL (DRAPES) ×2 IMPLANT
GLOVE SURG ENC MOIS LTX SZ6.5 (GLOVE) ×2 IMPLANT
GOWN STRL REUS W/ TWL LRG LVL3 (GOWN DISPOSABLE) ×2 IMPLANT
GOWN STRL REUS W/TWL LRG LVL3 (GOWN DISPOSABLE) ×4
GUIDEWIRE STR DUAL SENSOR (WIRE) ×2 IMPLANT
GUIDEWIRE STR ZIPWIRE 035X150 (MISCELLANEOUS) ×1 IMPLANT
KIT TURNOVER CYSTO (KITS) ×2 IMPLANT
MANIFOLD NEPTUNE II (INSTRUMENTS) ×2 IMPLANT
PACK CYSTO AR (MISCELLANEOUS) ×2 IMPLANT
SET CYSTO W/LG BORE CLAMP LF (SET/KITS/TRAYS/PACK) ×2 IMPLANT
SOL .9 NS 3000ML IRR  AL (IV SOLUTION) ×2
SOL .9 NS 3000ML IRR AL (IV SOLUTION) ×1
SOL .9 NS 3000ML IRR UROMATIC (IV SOLUTION) ×1 IMPLANT
SURGILUBE 2OZ TUBE FLIPTOP (MISCELLANEOUS) ×2 IMPLANT
WATER STERILE IRR 1000ML POUR (IV SOLUTION) ×2 IMPLANT

## 2020-07-02 NOTE — Anesthesia Procedure Notes (Signed)
Procedure Name: LMA Insertion Date/Time: 07/02/2020 4:06 PM Performed by: Karoline Caldwell, CRNA Pre-anesthesia Checklist: Patient identified, Patient being monitored, Timeout performed, Emergency Drugs available and Suction available Patient Re-evaluated:Patient Re-evaluated prior to induction Oxygen Delivery Method: Circle system utilized Preoxygenation: Pre-oxygenation with 100% oxygen Induction Type: IV induction Ventilation: Mask ventilation without difficulty LMA: LMA inserted LMA Size: 4.5 Tube type: Oral Number of attempts: 1 Placement Confirmation: positive ETCO2 and breath sounds checked- equal and bilateral Tube secured with: Tape Dental Injury: Teeth and Oropharynx as per pre-operative assessment

## 2020-07-02 NOTE — ED Provider Notes (Signed)
[facility] Emergency Department Provider Note  ____________________________________________  Time seen: Approximately 3:51 AM  I have reviewed the triage vital signs and the nursing notes.   HISTORY  Chief Complaint Flank Pain   HPI Jacob Nixon. is a 30 y.o. male with a history of recurrent kidney stones who presents for evaluation of left flank pain.  Patient reports recent lithotripsy done 10 days ago for a left-sided renal pelvic stone.  Reports passing several fragments.  Had no pain until this afternoon.   Patient reports sudden onset of severe sharp left flank pain associated with nausea.  Took 2 hydrocodone at home.  No dysuria or hematuria, no abdominal pain, no vomiting or fever.  The pain is identical to his prior kidney stones.  Patient also requesting treatment for chlamydia.  Reports that one of his sexual partners called him 2 days ago to tell him they tested positive for chlamydia.  He denies any symptoms of penile discharge or dysuria.  Past Medical History:  Diagnosis Date  . Bronchitis   . Cellulitis of right hand - RECURRENT   . Kidney stones     Patient Active Problem List   Diagnosis Date Noted  . Left anterior knee pain 10/13/2019  . Cervical disc disorder with radiculopathy of cervical region 08/23/2019  . Low back pain 08/23/2019  . Nonallopathic lesion of lumbosacral region 08/23/2019  . Nonallopathic lesion of sacral region 08/23/2019  . Nonallopathic lesion of thoracic region 08/23/2019  . Labral tear of shoulder 12/15/2018  . Arthritis of left acromioclavicular joint 07/15/2018  . Impingement syndrome of left shoulder 04/01/2018  . Hyponatremia 10/26/2016  . Hypokalemia 10/26/2016  . Elevated glucose level   . Arm swelling 10/15/2016  . Prediabetes 10/15/2016  . Cellulitis of hand 08/04/2016  . Carpal tunnel syndrome on right 05/08/2016  . Abscess of upper arm   . Cellulitis 01/22/2016  . Leukocytosis 01/22/2016   . Neuropathy 01/22/2016  . Nephrolithiasis 01/22/2016  . Cellulitis of right hand   . Cellulitis of forearm, right 07/30/2015    Past Surgical History:  Procedure Laterality Date  . CARPAL TUNNEL RELEASE Left 04/09/2016   Procedure: CARPAL TUNNEL RELEASE;  Surgeon: Cammy Copa, MD;  Location: [facility] OR;  Service: Orthopedics;  Laterality: Left;  . EXTRACORPOREAL SHOCK WAVE LITHOTRIPSY Left 06/22/2020   Procedure: EXTRACORPOREAL SHOCK WAVE LITHOTRIPSY (ESWL);  Surgeon: Vanna Scotland, MD;  Location: [facility] ORS;  Service: Urology;  Laterality: Left;  . HAND SURGERY    . KIDNEY STONE SURGERY     lithortripsy    Prior to Admission medications   Medication Sig Start Date End Date Taking? Authorizing Provider  cephALEXin (KEFLEX) 500 MG capsule Take 1 capsule (500 mg total) by mouth 2 (two) times daily for 7 days. 07/02/20 07/09/20 Yes Shandon Burlingame, [place], MD  doxycycline (VIBRAMYCIN) 100 MG capsule Take 1 capsule (100 mg total) by mouth 2 (two) times daily for 7 days. 07/02/20 07/09/20 Yes Sunny Aguon, [place], MD  ondansetron (ZOFRAN ODT) 4 MG disintegrating tablet Take 1 tablet (4 mg total) by mouth every 8 (eight) hours as needed. 07/02/20  Yes Don Perking, [place], MD  tamsulosin (FLOMAX) 0.4 MG CAPS capsule Take 1 capsule (0.4 mg total) by mouth daily for 7 days. 07/02/20 07/09/20 Yes Don Perking, [place], MD  acetaminophen (TYLENOL) 500 MG tablet Take 2 tablets (1,000 mg total) by mouth every 8 (eight) hours as needed for mild pain, moderate pain, fever or headache. 06/09/20 06/09/21  Nita Sickle, MD  acetaZOLAMIDE (DIAMOX) 125 MG tablet Take 1 tablet (125 mg total) by mouth 2 (two) times daily. 06/02/19   Judi Saa, DO  HYDROcodone-acetaminophen (NORCO) 5-325 MG tablet Take 1 tablet by mouth every 6 (six) hours as needed for moderate pain. 06/22/20   Vanna Scotland, MD  traMADol (ULTRAM) 50 MG tablet Take 1 tablet (50 mg total) by mouth every 6 (six) hours as needed. 06/09/20 06/09/21   Nita Sickle, MD  traZODone (DESYREL) 50 MG tablet Take 0.5-1 tablets (25-50 mg total) by mouth at bedtime as needed for sleep. 06/02/19   Judi Saa, DO    Allergies Coconut flavor and Penicillins  Family History  Problem Relation Age of Onset  . Hypertension Other   . Diabetes Father   . Healthy Mother     Social History Social History   Tobacco Use  . Smoking status: Former Smoker    Types: Cigarettes  . Smokeless tobacco: Never Used  Vaping Use  . Vaping Use: Every day  . Substances: Nicotine, Flavoring  Substance Use Topics  . Alcohol use: Yes  . Drug use: No    Review of Systems  Constitutional: Negative for fever. Eyes: Negative for visual changes. ENT: Negative for sore throat. Neck: No neck pain  Cardiovascular: Negative for chest pain. Respiratory: Negative for shortness of breath. Gastrointestinal: Negative for abdominal pain, vomiting or diarrhea. Genitourinary: Negative for dysuria. + L flank pain Musculoskeletal: Negative for back pain. Skin: Negative for rash. Neurological: Negative for headaches, weakness or numbness. Psych: No SI or HI  ____________________________________________   PHYSICAL EXAM:  VITAL SIGNS: ED Triage Vitals  Enc Vitals Group     BP 07/01/20 2359 (!) 144/109     Pulse Rate 07/01/20 2359 88     Resp 07/01/20 2359 18     Temp 07/01/20 2359 98.5 F (36.9 C)     Temp Source 07/01/20 2359 Oral     SpO2 07/01/20 2359 100 %     Weight 07/01/20 2356 253 lb (114.8 kg)     Height 07/01/20 2356 5\' 11"  (1.803 m)     Head Circumference --      Peak Flow --      Pain Score 07/01/20 2356 10     Pain Loc --      Pain Edu? --      Excl. in GC? --     Constitutional: Alert and oriented. Well appearing and in no apparent distress. HEENT:      Head: Normocephalic and atraumatic.         Eyes: Conjunctivae are normal. Sclera is non-icteric.       Mouth/Throat: Mucous membranes are moist.       Neck: Supple with no  signs of meningismus. Cardiovascular: Regular rate and rhythm. No murmurs, gallops, or rubs.  Respiratory: Normal respiratory effort. Lungs are clear to auscultation bilaterally.  Gastrointestinal: Soft, non tender, and non distended with positive bowel sounds. No rebound or guarding. Genitourinary: No CVA tenderness. Musculoskeletal:  No edema, cyanosis, or erythema of extremities. Neurologic: Normal speech and language. Face is symmetric. Moving all extremities. No gross focal neurologic deficits are appreciated. Skin: Skin is warm, dry and intact. No rash noted. Psychiatric: Mood and affect are normal. Speech and behavior are normal.  ____________________________________________   LABS (all labs ordered are listed, but only abnormal results are displayed)  Labs Reviewed  URINALYSIS, COMPLETE (UACMP) WITH MICROSCOPIC - Abnormal; Notable for the following components:      Result  Value   Color, Urine YELLOW (*)    APPearance HAZY (*)    Hgb urine dipstick LARGE (*)    Leukocytes,Ua SMALL (*)    Bacteria, UA RARE (*)    All other components within normal limits  BASIC METABOLIC PANEL - Abnormal; Notable for the following components:   Glucose, Bld 111 (*)    All other components within normal limits  CBC - Abnormal; Notable for the following components:   WBC 13.3 (*)    All other components within normal limits  URINE CULTURE  RESP PANEL BY RT-PCR (FLU A&B, COVID) [id number]   ____________________________________________  EKG  none  ____________________________________________  RADIOLOGY  I have personally reviewed the images performed during this visit and I agree with the Radiologist's read.   Interpretation by Radiologist:  DG Abdomen 1 View  Result Date: 07/02/2020 CLINICAL DATA:  Left flank pain.  Recent lithotripsy. EXAM: ABDOMEN - 1 VIEW COMPARISON:  Radiograph 06/22/2020.  Most recent CT 06/09/2020 FINDINGS: Previous stone in the region of the left ureteropelvic  junction is no longer seen. No definite stone or stone fragments along the course of the ureter or in the pelvis. No evidence of radiopaque calculi. No bowel dilatation. Occasional air-fluid levels within nondilated small bowel in the central abdomen. No free intra-abdominal air. Lung bases are clear. No acute osseous abnormalities are seen. IMPRESSION: Previous left UPJ calculus no longer seen. No radiograph evidence of stone or stone fragments along the course of the ureters or in the pelvis. Electronically Signed   By: Narda RutherfordMelanie  Sanford M.D.   On: 07/02/2020 00:30   CT Renal Stone Study  Result Date: 07/02/2020 CLINICAL DATA:  Left flank pain.  Previous lithotripsy. EXAM: CT ABDOMEN AND PELVIS WITHOUT CONTRAST TECHNIQUE: Multidetector CT imaging of the abdomen and pelvis was performed following the standard protocol without IV contrast. COMPARISON:  06/09/2020 FINDINGS: Lower chest: Normal Hepatobiliary: Normal Pancreas: Normal Spleen: Normal Adrenals/Urinary Tract: Adrenal glands are normal. Right kidney shows a 3 mm stone at the UPJ without gross hydronephrosis. Left kidney shows a 6 mm stone at the left UPJ with mild fullness of the renal collecting system. Nonobstructing 3 mm stone in the lower pole. No stone more distal in either ureter. No stone in the bladder. Stomach/Bowel: Stomach and small intestine are normal. No colon pathology. Normal appendix. Vascular/Lymphatic: Normal Reproductive: Normal Other: No free fluid or air. Musculoskeletal: Normal IMPRESSION: 1. 6 mm stone at the left UPJ/proximal ureter with mild fullness of the renal collecting system. 3 mm nonobstructing stone in the lower pole of the left kidney. 2. 3 mm stone at the right UPJ/proximal ureter without appreciable hydronephrosis. Electronically Signed   By: Paulina FusiMark  Shogry M.D.   On: 07/02/2020 03:56     ____________________________________________   PROCEDURES  Procedure(s) performed: None Procedures Critical Care performed:   None ____________________________________________   INITIAL IMPRESSION / ASSESSMENT AND PLAN / ED COURSE   30 y.o. male with a history of recurrent kidney stones who presents for evaluation of left flank pain.  Review of old medical records show the patient had a 5 mm left renal pelvic stone back in January.  Was having symptoms from it and followed up with urology.  10 days ago he had lithotripsy for it.  Reports passing several fragments and resolution of his pain.  Pain restarted again today.  No signs of sepsis.  Patient is hemodynamically stable.  Abdomen is soft with no tenderness.  Will obtain a repeat CT renal  as a KUB was visualized by me with no acute findings, confirmed by radiology.  UA is pending.  Labs show leukocytosis most likely stress related in the setting of a possible stone.  Patient received IV Dilaudid in the waiting room but reports that the pain is starting to return.  Will give IV Toradol and redosed Zofran.  In reference to STD exposure we will treat patient for chlamydia and gonorrhea with IV Rocephin and p.o. doxycycline.  Discussed safe sex with patient.  _________________________ 5:41 AM on 07/02/2020 -----------------------------------------  CT consistent with a proximal 6 mm stone.  UA with some bacteria but no nitrites.  Possibly from his exposure to STD versus contaminant.  Culture has been sent.  Patient was treated with IV Rocephin for STD which should cover for UTI as well.  Intractable pain in spite of several rounds of IV and p.o. narcotic medications, Toradol, Flomax, and Tylenol.  Discussed with Dr. Annabell Howells from Urology who recommended keeping patient n.p.o. and bring him into the hospitalist service for pain control and possible stenting.  Unfortunately we are unable to do lithotripsy until Thursday due to mobile truck not being here until then.  Will discuss with the hospitalist for admission    _____________________________________________ Please note:   Patient was evaluated in Emergency Department today for the symptoms described in the history of present illness. Patient was evaluated in the context of the global COVID-19 pandemic, which necessitated consideration that the patient might be at risk for infection with the SARS-CoV-2 virus that causes COVID-19. Institutional protocols and algorithms that pertain to the evaluation of patients at risk for COVID-19 are in a state of rapid change based on information released by regulatory bodies including the CDC and federal and state organizations. These policies and algorithms were followed during the patient's care in the ED.  Some ED evaluations and interventions may be delayed as a result of limited staffing during the pandemic.   [place] Controlled Substance Database was reviewed by me. ____________________________________________   FINAL CLINICAL IMPRESSION(S) / ED DIAGNOSES   Final diagnoses:  Kidney stone  Renal colic on left side  STD exposure  Intractable pain      NEW MEDICATIONS STARTED DURING THIS VISIT:  ED Discharge Orders         Ordered    doxycycline (VIBRAMYCIN) 100 MG capsule  2 times daily        07/02/20 0443    tamsulosin (FLOMAX) 0.4 MG CAPS capsule  Daily        07/02/20 0443    cephALEXin (KEFLEX) 500 MG capsule  2 times daily        07/02/20 0443    ondansetron (ZOFRAN ODT) 4 MG disintegrating tablet  Every 8 hours PRN        07/02/20 0443           Note:  This document was prepared using Dragon voice recognition software and may include unintentional dictation errors.    Don Perking, [place], MD 07/02/20 [phone]

## 2020-07-02 NOTE — H&P (Addendum)
Urology Consult   Physician requesting consult: Lorretta Harp, MD  Reason for consult: Left flank pain, obstructing left proximal ureteral stone  History of Present Illness: Jacob Nixon. is a 30 y.o. with a history of nephrolithiasis who underwent L ESWL for a left 6 mm renal pelvis stone by Dr. Vanna Scotland on 06/22/2020.  He states that in a week following the surgery, his pain was controlled.  He developed acute onset left flank pain yesterday, 07/01/2020.  This associate with nausea and emesis.  He denies right flank pain.  He denies fevers or chills.  He denies dysuria or gross hematuria.  CT scan 07/02/2020 revealed a 6 mm stone in the left UPJ/proximal ureter with mild fullness of the renal collecting system.  There is also a 3 mm nonobstructing stone no left lower pole.  There is also a 3 mm stone at the right proximal ureter without hydronephrosis.  He was found to be afebrile, WBC 13.3, lactic acid 1.1, creatinine 0.99, urinalysis Small leukocyte esterase, negative nitrate, urine culture pending.  He was admitted to internal medicine for pain management refractory to outpatient management.  Per internal medicine, he needs criteria for sepsis with pulse greater than 90 and mild leukocytosis.  As such, we discussed left ureteral stent placement.  He denies a history of voiding or storage urinary symptoms, hematuria, UTIs, STDs, urolithiasis, GU malignancy/trauma/surgery.  Past Medical History:  Diagnosis Date  . Bronchitis   . Cellulitis of right hand - RECURRENT   . Kidney stones     Past Surgical History:  Procedure Laterality Date  . CARPAL TUNNEL RELEASE Left 04/09/2016   Procedure: CARPAL TUNNEL RELEASE;  Surgeon: Cammy Copa, MD;  Location: [facility] OR;  Service: Orthopedics;  Laterality: Left;  . EXTRACORPOREAL SHOCK WAVE LITHOTRIPSY Left 06/22/2020   Procedure: EXTRACORPOREAL SHOCK WAVE LITHOTRIPSY (ESWL);  Surgeon: Vanna Scotland, MD;  Location: [facility] ORS;  Service: Urology;   Laterality: Left;  . HAND SURGERY    . KIDNEY STONE SURGERY     lithortripsy    Current Hospital Medications:  Home Meds:  No current facility-administered medications on file prior to encounter.   Current Outpatient Medications on File Prior to Encounter  Medication Sig Dispense Refill  . acetaminophen (TYLENOL) 500 MG tablet Take 2 tablets (1,000 mg total) by mouth every 8 (eight) hours as needed for mild pain, moderate pain, fever or headache. 50 tablet 0  . acetaZOLAMIDE (DIAMOX) 125 MG tablet Take 1 tablet (125 mg total) by mouth 2 (two) times daily. 60 tablet 1  . HYDROcodone-acetaminophen (NORCO) 5-325 MG tablet Take 1 tablet by mouth every 6 (six) hours as needed for moderate pain. 10 tablet 0  . traMADol (ULTRAM) 50 MG tablet Take 1 tablet (50 mg total) by mouth every 6 (six) hours as needed. 12 tablet 0  . traZODone (DESYREL) 50 MG tablet Take 0.5-1 tablets (25-50 mg total) by mouth at bedtime as needed for sleep. 30 tablet 3     Scheduled Meds: . doxycycline  100 mg Oral Q12H  . [START ON 07/03/2020] tamsulosin  0.4 mg Oral Daily   Continuous Infusions: . cefTRIAXone (ROCEPHIN)  IV    . [START ON 07/03/2020] cefTRIAXone (ROCEPHIN)  IV    . lactated ringers 125 mL/hr at 07/02/20 1420   PRN Meds:.acetaminophen, fentaNYL (SUBLIMAZE) injection, HYDROmorphone (DILAUDID) injection, ondansetron (ZOFRAN) IV, oxyCODONE-acetaminophen  Allergies:  Allergies  Allergen Reactions  . Coconut Flavor     Scratchy throat  . Penicillins  DID THE REACTION INVOLVE: Swelling of the face/tongue/throat, SOB, or low BP? Y Sudden or severe rash/hives, skin peeling, or the inside of the mouth or nose? N  Did it require medical treatment? Y When did it last happen?January 2020 If all above answers are "NO", may proceed with cephalosporin use.     Family History  Problem Relation Age of Onset  . Hypertension Other   . Diabetes Father   . Healthy Mother     Social History:   reports that he has quit smoking. His smoking use included cigarettes. He has never used smokeless tobacco. He reports current alcohol use. He reports that he does not use drugs.  ROS: A complete review of systems was performed.  All systems are negative except for pertinent findings as noted.  Physical Exam:  Vital signs in last 24 hours: Temp:  [98.5 F (36.9 C)] 98.5 F (36.9 C) (02/12 2359) Pulse Rate:  [68-97] 97 (02/13 1400) Resp:  [16-20] 20 (02/13 1334) BP: (93-144)/(60-116) 102/75 (02/13 1400) SpO2:  [95 %-100 %] 98 % (02/13 1400) Weight:  [114.8 kg] 114.8 kg (02/12 2356) Constitutional:  Alert and oriented, No acute distress Cardiovascular: Regular rate and rhythm Respiratory: Normal respiratory effort, Lungs clear bilaterally GI: Abdomen is soft, nontender, nondistended, no abdominal masses GU: No CVA tenderness Neurologic: Grossly intact, no focal deficits Psychiatric: Normal mood and affect  Laboratory Data:  Recent Labs    07/02/20 0001  WBC 13.3*  HGB 13.8  HCT 41.2  PLT 309    Recent Labs    07/02/20 0001  NA 136  K 3.8  CL 103  GLUCOSE 111*  BUN 12  CALCIUM 9.0  CREATININE 0.99     Results for orders placed or performed during the hospital encounter of 07/02/20 (from the past 24 hour(s))  Urinalysis, Complete w Microscopic Urine, Clean Catch     Status: Abnormal   Collection Time: 07/02/20 12:01 AM  Result Value Ref Range   Color, Urine YELLOW (A) YELLOW   APPearance HAZY (A) CLEAR   Specific Gravity, Urine 1.013 1.005 - 1.030   pH 6.0 5.0 - 8.0   Glucose, UA NEGATIVE NEGATIVE mg/dL   Hgb urine dipstick LARGE (A) NEGATIVE   Bilirubin Urine NEGATIVE NEGATIVE   Ketones, ur NEGATIVE NEGATIVE mg/dL   Protein, ur NEGATIVE NEGATIVE mg/dL   Nitrite NEGATIVE NEGATIVE   Leukocytes,Ua SMALL (A) NEGATIVE   RBC / HPF 21-50 0 - 5 RBC/hpf   WBC, UA 11-20 0 - 5 WBC/hpf   Bacteria, UA RARE (A) NONE SEEN   Squamous Epithelial / LPF NONE SEEN 0 - 5    Mucus PRESENT   Basic metabolic panel     Status: Abnormal   Collection Time: 07/02/20 12:01 AM  Result Value Ref Range   Sodium 136 135 - 145 mmol/L   Potassium 3.8 3.5 - 5.1 mmol/L   Chloride 103 98 - 111 mmol/L   CO2 24 22 - 32 mmol/L   Glucose, Bld 111 (H) 70 - 99 mg/dL   BUN 12 6 - 20 mg/dL   Creatinine, Ser 9.56 0.61 - 1.24 mg/dL   Calcium 9.0 8.9 - 21.3 mg/dL   GFR, Estimated >08 >65 mL/min   Anion gap 9 5 - 15  CBC     Status: Abnormal   Collection Time: 07/02/20 12:01 AM  Result Value Ref Range   WBC 13.3 (H) 4.0 - 10.5 K/uL   RBC 4.84 4.22 - 5.81 MIL/uL  Hemoglobin 13.8 13.0 - 17.0 g/dL   HCT 40.9 81.1 - 91.4 %   MCV 85.1 80.0 - 100.0 fL   MCH 28.5 26.0 - 34.0 pg   MCHC 33.5 30.0 - 36.0 g/dL   RDW 78.2 95.6 - 21.3 %   Platelets 309 150 - 400 K/uL   nRBC 0.0 0.0 - 0.2 %  Resp Panel by RT-PCR (Flu A&B, Covid) Nasopharyngeal Swab     Status: None   Collection Time: 07/02/20  5:41 AM   Specimen: Nasopharyngeal Swab; Nasopharyngeal(NP) swabs in vial transport medium  Result Value Ref Range   SARS Coronavirus 2 by RT PCR NEGATIVE NEGATIVE   Influenza A by PCR NEGATIVE NEGATIVE   Influenza B by PCR NEGATIVE NEGATIVE  Protime-INR     Status: None   Collection Time: 07/02/20  8:00 AM  Result Value Ref Range   Prothrombin Time 13.5 11.4 - 15.2 seconds   INR 1.1 0.8 - 1.2  APTT     Status: None   Collection Time: 07/02/20  8:00 AM  Result Value Ref Range   aPTT 27 24 - 36 seconds  Lactic acid, plasma     Status: None   Collection Time: 07/02/20  8:00 AM  Result Value Ref Range   Lactic Acid, Venous 1.1 0.5 - 1.9 mmol/L  Procalcitonin     Status: None   Collection Time: 07/02/20  8:00 AM  Result Value Ref Range   Procalcitonin <0.10 ng/mL   Recent Results (from the past 240 hour(s))  Resp Panel by RT-PCR (Flu A&B, Covid) Nasopharyngeal Swab     Status: None   Collection Time: 07/02/20  5:41 AM   Specimen: Nasopharyngeal Swab; Nasopharyngeal(NP) swabs in vial  transport medium  Result Value Ref Range Status   SARS Coronavirus 2 by RT PCR NEGATIVE NEGATIVE Final    Comment: (NOTE) SARS-CoV-2 target nucleic acids are NOT DETECTED.  The SARS-CoV-2 RNA is generally detectable in upper respiratory specimens during the acute phase of infection. The lowest concentration of SARS-CoV-2 viral copies this assay can detect is 138 copies/mL. A negative result does not preclude SARS-Cov-2 infection and should not be used as the sole basis for treatment or other patient management decisions. A negative result may occur with  improper specimen collection/handling, submission of specimen other than nasopharyngeal swab, presence of viral mutation(s) within the areas targeted by this assay, and inadequate number of viral copies(<138 copies/mL). A negative result must be combined with clinical observations, patient history, and epidemiological information. The expected result is Negative.  Fact Sheet for Patients:  BloggerCourse.com  Fact Sheet for Healthcare Providers:  SeriousBroker.it  This test is no t yet approved or cleared by the [place] FDA and  has been authorized for detection and/or diagnosis of SARS-CoV-2 by FDA under an Emergency Use Authorization (EUA). This EUA will remain  in effect (meaning this test can be used) for the duration of the COVID-19 declaration under Section 564(b)(1) of the Act, 21 U.S.C.section 360bbb-3(b)(1), unless the authorization is terminated  or revoked sooner.       Influenza A by PCR NEGATIVE NEGATIVE Final   Influenza B by PCR NEGATIVE NEGATIVE Final    Comment: (NOTE) The Xpert Xpress SARS-CoV-2/FLU/RSV plus assay is intended as an aid in the diagnosis of influenza from Nasopharyngeal swab specimens and should not be used as a sole basis for treatment. Nasal washings and aspirates are unacceptable for Xpert Xpress SARS-CoV-2/FLU/RSV testing.  Fact  Sheet for Patients: BloggerCourse.com  Fact Sheet for Healthcare Providers: SeriousBroker.ithttps://www.fda.gov/media/152162/download  This test is not yet approved or cleared by the [place] FDA and has been authorized for detection and/or diagnosis of SARS-CoV-2 by FDA under an Emergency Use Authorization (EUA). This EUA will remain in effect (meaning this test can be used) for the duration of the COVID-19 declaration under Section 564(b)(1) of the Act, 21 U.S.C. section 360bbb-3(b)(1), unless the authorization is terminated or revoked.  Performed at [facility], [address] [place], [place] [address]     Renal Function: Recent Labs    07/02/20 0001  CREATININE 0.99   Estimated Creatinine Clearance: 140.6 mL/min (by C-G formula based on SCr of 0.99 mg/dL).  Radiologic Imaging: DG Abdomen 1 View  Result Date: 07/02/2020 CLINICAL DATA:  Left flank pain.  Recent lithotripsy. EXAM: ABDOMEN - 1 VIEW COMPARISON:  Radiograph 06/22/2020.  Most recent CT 06/09/2020 FINDINGS: Previous stone in the region of the left ureteropelvic junction is no longer seen. No definite stone or stone fragments along the course of the ureter or in the pelvis. No evidence of radiopaque calculi. No bowel dilatation. Occasional air-fluid levels within nondilated small bowel in the central abdomen. No free intra-abdominal air. Lung bases are clear. No acute osseous abnormalities are seen. IMPRESSION: Previous left UPJ calculus no longer seen. No radiograph evidence of stone or stone fragments along the course of the ureters or in the pelvis. Electronically Signed   By: Narda RutherfordMelanie  Sanford M.D.   On: 07/02/2020 00:30   DG OR UROLOGY CYSTO IMAGE ([facility] ONLY)  Result Date: 07/02/2020 There is no interpretation for this exam.  This order is for images obtained during a surgical procedure.  Please See "Surgeries" Tab for more information regarding the procedure.   DG OR UROLOGY CYSTO IMAGE  ([facility] ONLY)  Result Date: 07/02/2020 There is no interpretation for this exam.  This order is for images obtained during a surgical procedure.  Please See "Surgeries" Tab for more information regarding the procedure.   CT Renal Stone Study  Result Date: 07/02/2020 CLINICAL DATA:  Left flank pain.  Previous lithotripsy. EXAM: CT ABDOMEN AND PELVIS WITHOUT CONTRAST TECHNIQUE: Multidetector CT imaging of the abdomen and pelvis was performed following the standard protocol without IV contrast. COMPARISON:  06/09/2020 FINDINGS: Lower chest: Normal Hepatobiliary: Normal Pancreas: Normal Spleen: Normal Adrenals/Urinary Tract: Adrenal glands are normal. Right kidney shows a 3 mm stone at the UPJ without gross hydronephrosis. Left kidney shows a 6 mm stone at the left UPJ with mild fullness of the renal collecting system. Nonobstructing 3 mm stone in the lower pole. No stone more distal in either ureter. No stone in the bladder. Stomach/Bowel: Stomach and small intestine are normal. No colon pathology. Normal appendix. Vascular/Lymphatic: Normal Reproductive: Normal Other: No free fluid or air. Musculoskeletal: Normal IMPRESSION: 1. 6 mm stone at the left UPJ/proximal ureter with mild fullness of the renal collecting system. 3 mm nonobstructing stone in the lower pole of the left kidney. 2. 3 mm stone at the right UPJ/proximal ureter without appreciable hydronephrosis. Electronically Signed   By: Paulina FusiMark  Shogry M.D.   On: 07/02/2020 03:56    I independently reviewed the above imaging studies.  Impression/Recommendation 1. Obstructing left ureteral stone with signs and suction:  CT scan 07/02/2020 revealed a 6 mm stone in the left UPJ/proximal ureter with mild fullness of the renal collecting system. S/p L ESWL 06/22/2020 for a 6mm left RP stone.  He was found to be afebrile, WBC 13.3, lactic  acid 1.1, creatinine 0.99, urinalysis Small leukocyte esterase, negative nitrate, urine culture pending. 2. Right proximal  ureteral stone: Measures 3 mm on CT A/P 07/02/2020.  He is asymptomatic denying from the stone.  No proximal hydronephrosis.  Recommend continued medical expulsive therapy.    -Reviewed CT A/P findings with the patient.  Discussed that he is a 6 mm LEFT proximal ureteral stone likely the same stone s/p L ESWL on 06/22/2020.  Discussed that he had according to internal medicine MD, signs meeting sepsis criteria including elevated heart rate and mild leukocytosis.  Discussed proceeding with cystoscopy, left retrograde pyelogram, left ureteral stent placement with plans for definitive treatment of his left proximal ureteral stone at a later date.  -We also discussed he has a proximal right ureteral stone with no hydronephrosis.  We discussed options including right ureteral stent placement versus medical expulsive therapy.  Given that there is no hydronephrosis on the right side and he is asymptomatic on the right, he elects to continue medical expulsive therapy for the small right proximal ureteral stone. -Pain control, hydration, flomax, abx OCTOR -Will need followup to treat his b/l ureteral stones at a later date. Will message staff to arrange.  -The risks, benefits and alternatives of cystoscopy with cysto, LEFT JJ stent placement was discussed with the patient.  Risks include, but are not limited to: bleeding, urinary tract infection, ureteral injury, ureteral stricture disease, chronic pain, urinary symptoms, bladder injury, stent migration, the need for nephrostomy tube placement, MI, CVA, DVT, PE and the inherent risks with general anesthesia.  The patient voices understanding and wishes to proceed.    Matt R. Felica Chargois MD 07/02/2020, 3:35 PM  [facility]  Pager: [phone]  CC: Lorretta Harp, MD

## 2020-07-02 NOTE — Anesthesia Postprocedure Evaluation (Signed)
Anesthesia Post Note  Patient: Jacob Nixon.  Procedure(s) Performed: CYSTOSCOPY WITH RETROGRADE PYELOGRAM, URETEROSCOPY AND STENT PLACEMENT POSSIBLY BILATERAL (Bilateral )  Patient location during evaluation: PACU Anesthesia Type: General Level of consciousness: awake and alert Pain management: pain level controlled Vital Signs Assessment: post-procedure vital signs reviewed and stable Respiratory status: spontaneous breathing and respiratory function stable Cardiovascular status: stable Anesthetic complications: no   No complications documented.   Last Vitals:  Vitals:   07/02/20 1716 07/02/20 1730  BP: 116/90 105/76  Pulse: 70   Resp: (!) 7 16  Temp:    SpO2: 100%     Last Pain:  Vitals:   07/02/20 1730  TempSrc:   PainSc: 0-No pain                 Alaric Gladwin K

## 2020-07-02 NOTE — Progress Notes (Signed)
Brief chart note. Full consult note to follow.  [name] S/p L ESWL on 07/27/2020 with Dr. Apolinar Junes for a 77mm L UPJ stone. Pt developed acute L flank pain yesterday, nausea, emesis. No dysuria. CT 07/02/2020 with LEFT 46mm proximal ureteral stone with mild fullness and RIGHT 14mm proximal ureteral stone without hydro.  Pt afebrile, HR 92, WBC 13.3, lactic acid 1.1, Cr 0.99, UA small LE, neg nitrite. UCx pending.  Although afebrile, meeting sepsis criteria per IM given leukocytosis and HR 92.  Called OR to post case. There are several ortho cases going today and earliest available is around 4PM. Will plan for cysto, L RPG, L stent around then unless pt is clinically deteriorating. Discussed with Dr. Clyde Lundborg and with Jacob Nixon on the phone.  Matt R. Bryce Cheever MD [facility]  Pager: [phone]

## 2020-07-02 NOTE — Transfer of Care (Signed)
Immediate Anesthesia Transfer of Care Note  Patient: Jacob Nixon.  Procedure(s) Performed: CYSTOSCOPY WITH RETROGRADE PYELOGRAM, URETEROSCOPY AND STENT PLACEMENT POSSIBLY BILATERAL (Bilateral )  Patient Location: PACU  Anesthesia Type:General  Level of Consciousness: sedated  Airway & Oxygen Therapy: Patient Spontanous Breathing and Patient connected to face mask oxygen  Post-op Assessment: Report given to RN and Post -op Vital signs reviewed and stable  Post vital signs: Reviewed and stable  Last Vitals:  Vitals Value Taken Time  BP 101/64 07/02/20 1645  Temp    Pulse 71 07/02/20 1649  Resp 10 07/02/20 1649  SpO2 100 % 07/02/20 1649  Vitals shown include unvalidated device data.  Last Pain:  Vitals:   07/02/20 1205  TempSrc:   PainSc: 6          Complications: No complications documented.

## 2020-07-02 NOTE — Op Note (Signed)
Operative Note  Preoperative diagnosis:  1.  Left proximal ureteral stone with signs of obstruction and infection 2. Right proximal ureteral stone (80mm) without sign of obstruction or hydronephrosis  Postoperative diagnosis: 1.  Left proximal ureteral stone with signs of obstruction and infection 2. Right proximal ureteral stone (1mm) without sign of obstruction or hydronephrosis  Procedure(s): 1.  Cystoscopy 2. Left retrograde pyelogram with interpretation 3. Left ureteral stent placement  Surgeon: Jettie Pagan, MD  Assistants:  None  Anesthesia:  General  Complications:  None  EBL:  Minimal  Specimens: 1. Left renal pelvis urine culture  Drains/Catheters: 1.  Left 6Fr x 26cm ureteral stent  Intraoperative findings:   1. Cystoscopy demonstrated no suspicious lesions, masses, stones or other pathology. 2. Left retrograde pyelogram demonstrated moderate left hydronephrosis. 3. Successful left ureteral stent placement with curl in the renal pelvis and bladder respectively.  Indication:  Jacob Nixon. is a 30 y.o. male with a history of a left renal pelvis stone measuring 6 mm.  He is s/p left ESWL on 06/22/2020 by Dr. Apolinar Junes.  He presented today with acute left sided flank pain, mild leukocytosis, tachycardia. His pain is refractory to medical management. After reviewing the management options for treatment, he elected to proceed with the above surgical procedure(s). We have discussed the potential benefits and risks of the procedure, side effects of the proposed treatment, the likelihood of the patient achieving the goals of the procedure, and any potential problems that might occur during the procedure or recuperation. Informed consent has been obtained.  Description of procedure: The patient was taken to the operating room and general anesthesia was induced.  The patient was placed in the dorsal lithotomy position, prepped and draped in the usual sterile fashion, and  preoperative antibiotics were administered. A preoperative time-out was performed.   Cystourethroscopy was performed. He did have mild fossa navicularis stenosis that required dilation from 18 Fr to a 22 Fr to allow accomodation of the cystoscope.  The remainder of the patient's urethra was examined and was normal. There was no bilobar prostatic hypertrophy. The bladder was then systematically examined in its entirety. There was no evidence for any bladder tumors, stones, or other mucosal pathology.    Attention then turned to the left ureteral orifice. A 0.038 zip wire was passed through the left orifice. The stone was somewhat impacted however, we were able to navigate beyond the stone using the 0.038 zip wire. Over the wire a 5 Fr open ended catheter was inserted and passed up to the level of the renal pelvis. Aspirate was obtained and sent off as  left renal pelvis urine for culture. Omnipaque contrast was injected through the ureteral catheter and a retrograde pyelogram was performed with findings as dictated above. The wire was then replaced and the open ended catheter was removed.   A 6Fr x 26cm ureteral stent was advance over the wire. The stent was positioned appropriately under fluoroscopic and cystoscopic guidance.  The wire was then removed with an adequate stent curl noted in the renal pelvis as well as in the bladder.  The bladder was then emptied and the procedure ended.  The patient appeared to tolerate the procedure well and without complications.  The patient was able to be awakened and transferred to the recovery unit in satisfactory condition.   Plan:  Admitted to IM overnight for observation. Likely discharge home tomorrow as long as afebrile tonight. Will plan for elective ESWL vs URS/LL to treat his  remaining stones.   Jacob R. Claryssa Sandner MD [facility]  Pager: [phone]

## 2020-07-02 NOTE — ED Notes (Signed)
Pt taken to CT at this time.

## 2020-07-02 NOTE — H&P (Addendum)
History and Physical    Jacob MorasAdrian L Flegal Jr. DZH:[id number] DOB: May 25, 1990 DOA: 07/02/2020  Referring MD/NP/PA:   PCP: Patient, No Pcp Per   Patient coming from:  The patient is coming from home.  At baseline, pt is independent for most of ADL.        Chief Complaint: left flank pain  HPI: Jacob Morasdrian L Liguori Jr. is a 30 y.o. male with medical history significant of kidney stone, who presents with left flank pain.  Patient recently underwent lithotripsy treatment for left UPJ stone by Dr. Apolinar JunesBrandon on 06/22/2020. He reports passing several fragments.  Had no pain until yesterday afternoon when he developed sudden onset of left flank pain, which is severe, sharp, radiating to front area, associated with nausea and vomiting.  He has had at least 5 times with nonbilious nonbloody vomiting.  Denies diarrhea or abdominal pain.  No fever or chills.  Denies symptoms of UTI.  No chest pain, cough, shortness breath. Patient is also requesting treatment for chlamydia.  Reports that one of his sexual partners called him 2 days ago to tell him they tested positive for chlamydia.  He denies any symptoms of penile discharge or dysuria.  ED Course: pt was found to have WBC 13.3, negative Covid PCR, urinalysis (hazy appearance, small amount of leukocyte, rare bacteria, WBC 11-20), electrolytes renal function okay, temperature normal, blood pressure 134/116, heart rate 92, RR 18, oxygen saturation 98% on room air.  Patient is admitted to MedSurg bed as inpatient.  Dr. Annabell HowellsWrenn of urology is consulted.  CT-renal stone study: 1. 6 mm stone at the left UPJ/proximal ureter with mild fullness of the renal collecting system. 3 mm nonobstructing stone in the lower pole of the left kidney. 2. 3 mm stone at the right UPJ/proximal ureter without appreciable Hydronephrosis.    Review of Systems:   General: no fevers, chills, no body weight gain, has fatigue HEENT: no blurry vision, hearing changes or sore  throat Respiratory: no dyspnea, coughing, wheezing CV: no chest pain, no palpitations GI: has nausea, vomiting, no abdominal pain, diarrhea, constipation GU: no dysuria, burning on urination, increased urinary frequency, hematuria  Ext: no leg edema Neuro: no unilateral weakness, numbness, or tingling, no vision change or hearing loss Skin: no rash, no skin tear. MSK: No muscle spasm, no deformity, no limitation of range of movement in spin. Has left flank pain Heme: No easy bruising.  Travel history: No recent long distant travel.  Allergy:  Allergies  Allergen Reactions  . Coconut Flavor     Scratchy throat  . Penicillins     DID THE REACTION INVOLVE: Swelling of the face/tongue/throat, SOB, or low BP? Y Sudden or severe rash/hives, skin peeling, or the inside of the mouth or nose? N  Did it require medical treatment? Y When did it last happen?January 2020 If all above answers are "NO", may proceed with cephalosporin use.     Past Medical History:  Diagnosis Date  . Bronchitis   . Cellulitis of right hand - RECURRENT   . Kidney stones     Past Surgical History:  Procedure Laterality Date  . CARPAL TUNNEL RELEASE Left 04/09/2016   Procedure: CARPAL TUNNEL RELEASE;  Surgeon: Cammy CopaScott Gregory [name], MD;  [facility];  Service: Orthopedics;  Laterality: Left;  . EXTRACORPOREAL SHOCK WAVE LITHOTRIPSY Left 06/22/2020   Procedure: EXTRACORPOREAL SHOCK WAVE LITHOTRIPSY (ESWL);  Surgeon: Vanna ScotlandBrandon, Ashley, MD;  Location: [facility] ORS;  Service: Urology;  Laterality: Left;  . HAND  SURGERY    . KIDNEY STONE SURGERY     lithortripsy    Social History:  reports that he has quit smoking. His smoking use included cigarettes. He has never used smokeless tobacco. He reports current alcohol use. He reports that he does not use drugs.  Family History:  Family History  Problem Relation Age of Onset  . Hypertension Other   . Diabetes Father   . Healthy Mother      Prior to Admission  medications   Medication Sig Start Date End Date Taking? Authorizing Provider  acetaminophen (TYLENOL) 500 MG tablet Take 2 tablets (1,000 mg total) by mouth every 8 (eight) hours as needed for mild pain, moderate pain, fever or headache. 06/09/20 06/09/21 Yes Don Perking, [place], MD  acetaZOLAMIDE (DIAMOX) 125 MG tablet Take 1 tablet (125 mg total) by mouth 2 (two) times daily. 06/02/19  Yes Judi Saa, DO  cephALEXin (KEFLEX) 500 MG capsule Take 1 capsule (500 mg total) by mouth 2 (two) times daily for 7 days. 07/02/20 07/09/20 Yes Veronese, [place], MD  doxycycline (VIBRAMYCIN) 100 MG capsule Take 1 capsule (100 mg total) by mouth 2 (two) times daily for 7 days. 07/02/20 07/09/20 Yes Veronese, [place], MD  HYDROcodone-acetaminophen [facility]) 5-325 MG tablet Take 1 tablet by mouth every 6 (six) hours as needed for moderate pain. 06/22/20  Yes Vanna Scotland, MD  ondansetron (ZOFRAN ODT) 4 MG disintegrating tablet Take 1 tablet (4 mg total) by mouth every 8 (eight) hours as needed. 07/02/20  Yes Don Perking, [place], MD  tamsulosin (FLOMAX) 0.4 MG CAPS capsule Take 1 capsule (0.4 mg total) by mouth daily for 7 days. 07/02/20 07/09/20 Yes Veronese, [place], MD  traMADol (ULTRAM) 50 MG tablet Take 1 tablet (50 mg total) by mouth every 6 (six) hours as needed. 06/09/20 06/09/21 Yes Veronese, [place], MD  traZODone (DESYREL) 50 MG tablet Take 0.5-1 tablets (25-50 mg total) by mouth at bedtime as needed for sleep. 06/02/19  Yes Judi Saa, DO    Physical Exam: Vitals:   07/01/20 2356 07/01/20 2359 07/02/20 0312 07/02/20 0720  BP:  (!) 144/109 (!) 134/116 93/73  Pulse:  88 92 71  Resp:  18 16 20   Temp:  98.5 F (36.9 C)    TempSrc:  Oral    SpO2:  100% 98% 98%  Weight: 114.8 kg     Height: 5\' 11"  (1.803 m)      General: Not in acute distress HEENT:       Eyes: PERRL, EOMI, no scleral icterus.       ENT: No discharge from the ears and nose, no pharynx injection, no tonsillar enlargement.         Neck: No JVD, no bruit, no mass felt. Heme: No neck lymph node enlargement. Cardiac: S1/S2, RRR, No murmurs, No gallops or rubs. Respiratory: No rales, wheezing, rhonchi or rubs. GI: Soft, nondistended, nontender, no rebound pain, no organomegaly, BS present. GU: positive left CVA tenderness Ext: No pitting leg edema bilaterally. 1+DP/PT pulse bilaterally. Musculoskeletal: No joint deformities, No joint redness or warmth, no limitation of ROM in spin. Skin: No rashes.  Neuro: Alert, oriented X3, cranial nerves II-XII grossly intact, moves all extremities normally.   Psych: Patient is not psychotic, no suicidal or hemocidal ideation.  Labs on Admission: I have personally reviewed following labs and imaging studies  CBC: Recent Labs  Lab 07/02/20 0001  WBC 13.3*  HGB 13.8  HCT 41.2  MCV 85.1  PLT 309   Basic  Metabolic Panel: Recent Labs  Lab 07/02/20 0001  NA 136  K 3.8  CL 103  CO2 24  GLUCOSE 111*  BUN 12  CREATININE 0.99  CALCIUM 9.0   GFR: Estimated Creatinine Clearance: 140.6 mL/min (by C-G formula based on SCr of 0.99 mg/dL). Liver Function Tests: No results for input(s): AST, ALT, ALKPHOS, BILITOT, PROT, ALBUMIN in the last 168 hours. No results for input(s): LIPASE, AMYLASE in the last 168 hours. No results for input(s): AMMONIA in the last 168 hours. Coagulation Profile: Recent Labs  Lab 07/02/20 0800  INR 1.1   Cardiac Enzymes: No results for input(s): CKTOTAL, CKMB, CKMBINDEX, TROPONINI in the last 168 hours. BNP (last 3 results) No results for input(s): PROBNP in the last 8760 hours. HbA1C: No results for input(s): HGBA1C in the last 72 hours. CBG: No results for input(s): GLUCAP in the last 168 hours. Lipid Profile: No results for input(s): CHOL, HDL, LDLCALC, TRIG, CHOLHDL, LDLDIRECT in the last 72 hours. Thyroid Function Tests: No results for input(s): TSH, T4TOTAL, FREET4, T3FREE, THYROIDAB in the last 72 hours. Anemia Panel: No results for  input(s): VITAMINB12, FOLATE, FERRITIN, TIBC, IRON, RETICCTPCT in the last 72 hours. Urine analysis:    Component Value Date/Time   COLORURINE YELLOW (A) 07/02/2020 0001   APPEARANCEUR HAZY (A) 07/02/2020 0001   APPEARANCEUR Cloudy (A) 06/15/2020 1411   LABSPEC 1.013 07/02/2020 0001   PHURINE 6.0 07/02/2020 0001   GLUCOSEU NEGATIVE 07/02/2020 0001   HGBUR LARGE (A) 07/02/2020 0001   BILIRUBINUR NEGATIVE 07/02/2020 0001   BILIRUBINUR Negative 06/15/2020 1411   KETONESUR NEGATIVE 07/02/2020 0001   PROTEINUR NEGATIVE 07/02/2020 0001   UROBILINOGEN 1.0 11/28/2014 1026   NITRITE NEGATIVE 07/02/2020 0001   LEUKOCYTESUR SMALL (A) 07/02/2020 0001   Sepsis Labs: @LABRCNTIP (procalcitonin:4,lacticidven:4) ) Recent Results (from the past 240 hour(s))  Resp Panel by RT-PCR (Flu A&B, Covid) Nasopharyngeal Swab     Status: None   Collection Time: 07/02/20  5:41 AM   Specimen: Nasopharyngeal Swab; Nasopharyngeal(NP) swabs in vial transport medium  Result Value Ref Range Status   SARS Coronavirus 2 by RT PCR NEGATIVE NEGATIVE Final    Comment: (NOTE) SARS-CoV-2 target nucleic acids are NOT DETECTED.  The SARS-CoV-2 RNA is generally detectable in upper respiratory specimens during the acute phase of infection. The lowest concentration of SARS-CoV-2 viral copies this assay can detect is 138 copies/mL. A negative result does not preclude SARS-Cov-2 infection and should not be used as the sole basis for treatment or other patient management decisions. A negative result may occur with  improper specimen collection/handling, submission of specimen other than nasopharyngeal swab, presence of viral mutation(s) within the areas targeted by this assay, and inadequate number of viral copies(<138 copies/mL). A negative result must be combined with clinical observations, patient history, and epidemiological information. The expected result is Negative.  Fact Sheet for Patients:   07/04/20  Fact Sheet for Healthcare Providers:  BloggerCourse.com  This test is no t yet approved or cleared by the [place] FDA and  has been authorized for detection and/or diagnosis of SARS-CoV-2 by FDA under an Emergency Use Authorization (EUA). This EUA will remain  in effect (meaning this test can be used) for the duration of the COVID-19 declaration under Section 564(b)(1) of the Act, 21 U.S.C.section 360bbb-3(b)(1), unless the authorization is terminated  or revoked sooner.       Influenza A by PCR NEGATIVE NEGATIVE Final   Influenza B by PCR NEGATIVE NEGATIVE Final  Comment: (NOTE) The Xpert Xpress SARS-CoV-2/FLU/RSV plus assay is intended as an aid in the diagnosis of influenza from Nasopharyngeal swab specimens and should not be used as a sole basis for treatment. Nasal washings and aspirates are unacceptable for Xpert Xpress SARS-CoV-2/FLU/RSV testing.  Fact Sheet for Patients: BloggerCourse.com  Fact Sheet for Healthcare Providers: SeriousBroker.it  This test is not yet approved or cleared by the [place] FDA and has been authorized for detection and/or diagnosis of SARS-CoV-2 by FDA under an Emergency Use Authorization (EUA). This EUA will remain in effect (meaning this test can be used) for the duration of the COVID-19 declaration under Section 564(b)(1) of the Act, 21 U.S.C. section 360bbb-3(b)(1), unless the authorization is terminated or revoked.  Performed at [facility], [address] [place], [place] [address]      Radiological Exams on Admission: DG Abdomen 1 View  Result Date: 07/02/2020 CLINICAL DATA:  Left flank pain.  Recent lithotripsy. EXAM: ABDOMEN - 1 VIEW COMPARISON:  Radiograph 06/22/2020.  Most recent CT 06/09/2020 FINDINGS: Previous stone in the region of the left ureteropelvic junction is no longer seen.  No definite stone or stone fragments along the course of the ureter or in the pelvis. No evidence of radiopaque calculi. No bowel dilatation. Occasional air-fluid levels within nondilated small bowel in the central abdomen. No free intra-abdominal air. Lung bases are clear. No acute osseous abnormalities are seen. IMPRESSION: Previous left UPJ calculus no longer seen. No radiograph evidence of stone or stone fragments along the course of the ureters or in the pelvis. Electronically Signed   By: Narda Rutherford M.D.   On: 07/02/2020 00:30   CT Renal Stone Study  Result Date: 07/02/2020 CLINICAL DATA:  Left flank pain.  Previous lithotripsy. EXAM: CT ABDOMEN AND PELVIS WITHOUT CONTRAST TECHNIQUE: Multidetector CT imaging of the abdomen and pelvis was performed following the standard protocol without IV contrast. COMPARISON:  06/09/2020 FINDINGS: Lower chest: Normal Hepatobiliary: Normal Pancreas: Normal Spleen: Normal Adrenals/Urinary Tract: Adrenal glands are normal. Right kidney shows a 3 mm stone at the UPJ without gross hydronephrosis. Left kidney shows a 6 mm stone at the left UPJ with mild fullness of the renal collecting system. Nonobstructing 3 mm stone in the lower pole. No stone more distal in either ureter. No stone in the bladder. Stomach/Bowel: Stomach and small intestine are normal. No colon pathology. Normal appendix. Vascular/Lymphatic: Normal Reproductive: Normal Other: No free fluid or air. Musculoskeletal: Normal IMPRESSION: 1. 6 mm stone at the left UPJ/proximal ureter with mild fullness of the renal collecting system. 3 mm nonobstructing stone in the lower pole of the left kidney. 2. 3 mm stone at the right UPJ/proximal ureter without appreciable hydronephrosis. Electronically Signed   By: Paulina Fusi M.D.   On: 07/02/2020 03:56     EKG:  Not done in ED, will get one.   Assessment/Plan Principal Problem:   Obstructive uropathy Active Problems:   Acute unilateral obstructive  uropathy   Acute pyelonephritis   Sepsis (HCC)   STD exposure   Sepsis due to acute pyelonephritis 2/2 obstructive uropathy: pt meets criteria for sepsis with leukocytosis with WBC 13.3, tachycardia with heart rate 92.  Pending lactic acid.  Currently hemodynamically stable. Dr. Annabell Howells of urology is consulted --> he will let Dr. Charlton Haws know.  -will admit to med-surg bed as inpt -IV Rocephin -Follow-up of blood culture and urine culture -will get Procalcitonin and trend lactic acid levels per sepsis protocol. -IVF: 2L of IVF  bolus in ED, followed by 125 cc/h of LR  STD exposure: -started doxycycline -Check HIV antibody    DVT ppx: SCD Code Status: Full code Family Communication: Yes, patient's mother   at bed side Disposition Plan:  Anticipate discharge back to previous environment Consults called:  Dr. Annabell Howells of urology  Admission status and Level of care: Med-Surg:    Med-surg bed as inpt        Status is: Inpatient  Remains inpatient appropriate because:Inpatient level of care appropriate due to severity of illness   Dispo: The patient is from: Home              Anticipated d/c is to: Home              Anticipated d/c date is: 2 days              Patient currently is not medically stable to d/c.   Difficult to place patient No          Date of Service 07/02/2020    Lorretta Harp [facility]   If 7PM-7AM, please contact night-coverage [email or web address] 07/02/2020, 9:08 AM

## 2020-07-02 NOTE — Anesthesia Preprocedure Evaluation (Signed)
Anesthesia Evaluation  Patient identified by MRN, date of birth, ID band Patient awake    Reviewed: Allergy & Precautions, NPO status , Patient's Chart, lab work & pertinent test results  History of Anesthesia Complications Negative for: history of anesthetic complications  Airway Mallampati: II       Dental   Pulmonary neg sleep apnea, neg COPD, Not current smoker, former smoker,           Cardiovascular (-) hypertension(-) Past MI and (-) CHF (-) dysrhythmias (-) Valvular Problems/Murmurs     Neuro/Psych neg Seizures    GI/Hepatic Neg liver ROS, GERD  ,  Endo/Other  neg diabetes  Renal/GU Renal disease (wtones)     Musculoskeletal   Abdominal (+) + obese,   Peds  Hematology   Anesthesia Other Findings   Reproductive/Obstetrics                             Anesthesia Physical Anesthesia Plan  ASA: II  Anesthesia Plan: General   Post-op Pain Management:    Induction: Intravenous  PONV Risk Score and Plan:   Airway Management Planned: LMA  Additional Equipment:   Intra-op Plan:   Post-operative Plan:   Informed Consent: I have reviewed the patients History and Physical, chart, labs and discussed the procedure including the risks, benefits and alternatives for the proposed anesthesia with the patient or authorized representative who has indicated his/her understanding and acceptance.       Plan Discussed with:   Anesthesia Plan Comments:         Anesthesia Quick Evaluation

## 2020-07-03 ENCOUNTER — Encounter: Payer: Self-pay | Admitting: Urology

## 2020-07-03 DIAGNOSIS — N201 Calculus of ureter: Secondary | ICD-10-CM

## 2020-07-03 LAB — BASIC METABOLIC PANEL
Anion gap: 6 (ref 5–15)
BUN: 8 mg/dL (ref 6–20)
CO2: 24 mmol/L (ref 22–32)
Calcium: 8.9 mg/dL (ref 8.9–10.3)
Chloride: 104 mmol/L (ref 98–111)
Creatinine, Ser: 0.78 mg/dL (ref 0.61–1.24)
GFR, Estimated: >60 mL/min (ref >60)
Glucose, Bld: 157 mg/dL — ABNORMAL HIGH (ref 70–99)
Potassium: 4.3 mmol/L (ref 3.5–5.1)
Sodium: 134 mmol/L — ABNORMAL LOW (ref 135–145)

## 2020-07-03 LAB — URINE CULTURE
Culture: NO GROWTH
Culture: NO GROWTH

## 2020-07-03 LAB — CBC
HCT: 40.7 % (ref 39.0–52.0)
Hemoglobin: 13.9 g/dL (ref 13.0–17.0)
MCH: 29.1 pg (ref 26.0–34.0)
MCHC: 34.2 g/dL (ref 30.0–36.0)
MCV: 85.1 fL (ref 80.0–100.0)
Platelets: 326 10*3/uL (ref 150–400)
RBC: 4.78 MIL/uL (ref 4.22–5.81)
RDW: 13.6 % (ref 11.5–15.5)
WBC: 9.1 10*3/uL (ref 4.0–10.5)
nRBC: 0 % (ref 0.0–0.2)

## 2020-07-03 LAB — HIV ANTIBODY (ROUTINE TESTING W REFLEX): HIV Screen 4th Generation wRfx: NONREACTIVE

## 2020-07-03 MED ORDER — SODIUM CHLORIDE 0.9 % IV SOLN
INTRAVENOUS | Status: DC | PRN
  Administered 2020-07-03: 06:00:00 250 mL via INTRAVENOUS

## 2020-07-03 MED ORDER — HYDROMORPHONE HCL 1 MG/ML IJ SOLN: 0.5000 mg | INTRAMUSCULAR | Status: DC | PRN

## 2020-07-03 MED ORDER — HYDROMORPHONE HCL 1 MG/ML IJ SOLN
1.0000 mg | INTRAMUSCULAR | Status: DC | PRN
  Filled 2020-07-03: qty 1

## 2020-07-03 MED ORDER — MELATONIN 3 MG PO TABS
3.0000 mg | ORAL_TABLET | Freq: Every day | ORAL | Status: DC
  Administered 2020-07-03 – 2020-07-06 (×4): 3 mg via ORAL
  Filled 2020-07-03 (×5): qty 1

## 2020-07-03 MED ORDER — TRAZODONE HCL 50 MG PO TABS
50.0000 mg | ORAL_TABLET | Freq: Every day | ORAL | Status: DC
  Administered 2020-07-03 – 2020-07-06 (×4): 50 mg via ORAL
  Filled 2020-07-03 (×4): qty 1

## 2020-07-03 MED ORDER — OXYBUTYNIN CHLORIDE 5 MG PO TABS
5.0000 mg | ORAL_TABLET | Freq: Three times a day (TID) | ORAL | Status: DC
  Administered 2020-07-03 – 2020-07-07 (×12): 5 mg via ORAL
  Filled 2020-07-03 (×12): qty 1

## 2020-07-03 MED ORDER — TAMSULOSIN HCL 0.4 MG PO CAPS
0.8000 mg | ORAL_CAPSULE | Freq: Every day | ORAL | Status: DC
  Administered 2020-07-03 – 2020-07-07 (×5): 0.8 mg via ORAL
  Filled 2020-07-03 (×5): qty 2

## 2020-07-03 MED ORDER — HYDROMORPHONE HCL 1 MG/ML IJ SOLN
1.0000 mg | INTRAMUSCULAR | Status: DC | PRN
  Administered 2020-07-03 – 2020-07-04 (×4): 1 mg via INTRAVENOUS
  Filled 2020-07-03 (×5): qty 1

## 2020-07-03 MED ORDER — MORPHINE SULFATE (PF) 4 MG/ML IV SOLN
4.0000 mg | INTRAVENOUS | Status: DC | PRN
  Administered 2020-07-03 (×2): 4 mg via INTRAVENOUS
  Filled 2020-07-03 (×2): qty 1

## 2020-07-03 NOTE — Progress Notes (Signed)
Patient had requested pain/nausea medication via another staff member. Upon entering patient's room , patient lying there with eyes closed in no acute distress. . This RN did not wake patient as he appeared comfortable. Will reassess needs for pain/nausea medication. Anselm Jungling

## 2020-07-03 NOTE — Progress Notes (Addendum)
Urology Inpatient Progress Note  Subjective: No acute events overnight. He is afebrile, VSS. WBC count down today, 9.1. Creatinine down, 0.78. Urine and blood cultures pending, on antibiotics as below. Patient is spontaneously voiding. He reports severe left flank pain, gross hematuria, dysuria, urgency, and frequency today. He does not believe his pain is well controlled and is concerned about discharging home. He denies right flank pain.  Anti-infectives: Anti-infectives (From admission, onward)   Start     Dose/Rate Route Frequency Ordered Stop   07/03/20 0700  cefTRIAXone (ROCEPHIN) 2 g in sodium chloride 0.9 % 100 mL IVPB        2 g 200 mL/hr over 30 Minutes Intravenous Every 24 hours 07/02/20 0726     07/02/20 1500  doxycycline (VIBRA-TABS) tablet 100 mg        100 mg Oral Every 12 hours 07/02/20 0726     07/02/20 0730  cefTRIAXone (ROCEPHIN) 1 g in sodium chloride 0.9 % 100 mL IVPB        1 g 200 mL/hr over 30 Minutes Intravenous  Once 07/02/20 0725     07/02/20 0315  cefTRIAXone (ROCEPHIN) 500 mg in dextrose 5 % 50 mL IVPB  Status:  Discontinued        500 mg 100 mL/hr over 30 Minutes Intravenous  Once 07/02/20 0303 07/02/20 0309   07/02/20 0315  doxycycline (VIBRA-TABS) tablet 100 mg        100 mg Oral  Once 07/02/20 0303 07/02/20 0309   07/02/20 0315  cefTRIAXone (ROCEPHIN) 1 g in sodium chloride 0.9 % 100 mL IVPB        1 g 200 mL/hr over 30 Minutes Intravenous  Once 07/02/20 0309 07/02/20 0544   07/02/20 0000  doxycycline (VIBRAMYCIN) 100 MG capsule        100 mg Oral 2 times daily 07/02/20 0443 07/09/20 2359   07/02/20 0000  cephALEXin (KEFLEX) 500 MG capsule        500 mg Oral 2 times daily 07/02/20 0443 07/09/20 2359      Current Facility-Administered Medications  Medication Dose Route Frequency Provider Last Rate Last Admin  . 0.9 %  sodium chloride infusion   Intravenous PRN Sidney Ace, MD   Stopped at 07/03/20 [phone]  . acetaminophen (TYLENOL) tablet 650  mg  650 mg Oral Q6H PRN Ivor Costa, MD      . cefTRIAXone (ROCEPHIN) 1 g in sodium chloride 0.9 % 100 mL IVPB  1 g Intravenous Once Ivor Costa, MD      . cefTRIAXone (ROCEPHIN) 2 g in sodium chloride 0.9 % 100 mL IVPB  2 g Intravenous Q24H Ivor Costa, MD 200 mL/hr at 07/03/20 3846 Infusion Verify at 07/03/20 6599  . doxycycline (VIBRA-TABS) tablet 100 mg  100 mg Oral Q12H Ivor Costa, MD   100 mg at 07/02/20 2202  . fentaNYL (SUBLIMAZE) injection 50 mcg  50 mcg Intravenous Q2H PRN Ivor Costa, MD   50 mcg at 07/02/20 1042  . HYDROmorphone (DILAUDID) injection 1 mg  1 mg Intravenous Q3H PRN Ivor Costa, MD   1 mg at 07/03/20 3570  . lactated ringers infusion   Intravenous Continuous Ivor Costa, MD   Stopped at 07/03/20 [phone]  . ondansetron (ZOFRAN) injection 4 mg  4 mg Intravenous Q8H PRN Ivor Costa, MD   4 mg at 07/03/20 0049  . oxyCODONE-acetaminophen (PERCOCET/ROXICET) 5-325 MG per tablet 1 tablet  1 tablet Oral Q4H PRN Ivor Costa, MD   1 tablet  at 07/03/20 0538  . tamsulosin (FLOMAX) capsule 0.4 mg  0.4 mg Oral Daily Ivor Costa, MD      . traZODone (DESYREL) tablet 25-50 mg  25-50 mg Oral QHS PRN Ivor Costa, MD       Objective: Vital signs in last 24 hours: Temp:  [97 F (36.1 C)-98 F (36.7 C)] 98 F (36.7 C) (02/14 0839) Pulse Rate:  [68-97] 70 (02/14 0839) Resp:  [7-26] 19 (02/13 2045) BP: (94-126)/(58-90) 108/65 (02/14 0839) SpO2:  [94 %-100 %] 100 % (02/14 0839)  Intake/Output from previous day: 02/13 0701 - 02/14 0700 In: 3638.7 [P.O.:240; I.V.:1780; IV Piggyback:1618.7] Out: 150 [Urine:150] Intake/Output this shift: No intake/output data recorded.  Physical Exam Vitals and nursing note reviewed.  Constitutional:      General: He is not in acute distress.    Appearance: He is not ill-appearing, toxic-appearing or diaphoretic.  HENT:     Head: Normocephalic and atraumatic.  Pulmonary:     Effort: Pulmonary effort is normal. No respiratory distress.  Skin:    General: Skin  is warm and dry.  Neurological:     Mental Status: He is alert and oriented to person, place, and time.  Psychiatric:        Mood and Affect: Mood normal.        Behavior: Behavior normal.    Lab Results:  Recent Labs    07/02/20 0001 07/03/20 0521  WBC 13.3* 9.1  HGB 13.8 13.9  HCT 41.2 40.7  PLT 309 326   BMET Recent Labs    07/02/20 0001 07/03/20 0521  NA 136 134*  K 3.8 4.3  CL 103 104  CO2 24 24  GLUCOSE 111* 157*  BUN 12 8  CREATININE 0.99 0.78  CALCIUM 9.0 8.9   PT/INR Recent Labs    07/02/20 0800  LABPROT 13.5  INR 1.1   Assessment & Plan: 30 year old male s/p ESWL for management of a 5 mm left UPJ stone, readmitted with persistent left UVJ stone with urinary infection and a new 3 mm right proximal ureteral stone.  Patient remains asymptomatic of his right ureteral stone, however he appears to be having severe stent symptoms on the left side and his pain is poorly controlled.  Recommendations: -Increase Flomax to 0.8 mg daily for MET and management of left sided stent symptoms -Start oxybutynin 5 mg every 8 hours for management of stent discomfort -Continue empiric antibiotics with plans for 14-day course of culture appropriate therapy -Outpatient follow-up with Dr. Erlene Quan in 1 week to discuss definitive management of his stone/stones -Recommend against discharging patient until pain is well controlled  Debroah Loop, PA-C 07/03/2020

## 2020-07-03 NOTE — Progress Notes (Signed)
PROGRESS NOTE    Cornelius MorasAdrian L Bragdon Jr.  UEA:[id number]:[id number] DOB: Nov 22, 1990 DOA: 07/02/2020 PCP: Patient, No Pcp Per   Brief Narrative:  30 y.o. male with medical history significant of kidney stone, who presents with left flank pain.  Patient recently underwent lithotripsy treatment for left UPJ stone by Dr. Apolinar JunesBrandon on 06/22/2020. He reports passing several fragments. Had no pain until yesterday afternoon when he developed sudden onset of left flank pain, which is severe, sharp, radiating to front area, associated with nausea and vomiting.  He has had at least 5 times with nonbilious nonbloody vomiting.  Denies diarrhea or abdominal pain.  No fever or chills.  Denies symptoms of UTI.  No chest pain, cough, shortness breath. Patient is also requesting treatment for chlamydia. Reports that one of his sexual partners called him 2 days ago to tell him they tested positive for chlamydia. He denies any symptoms of penile discharge or dysuria.  Urology consulted from admission.  Patient underwent ESWL for management of 5 mm left UPJ stone.  Readmitted with persistent left UVJ stone with associated urinary tract infection and new 3 mm right proximal ureteral stone.  Patient having significant pain associated with stent symptoms.   Assessment & Plan:   Principal Problem:   Obstructive uropathy Active Problems:   Acute unilateral obstructive uropathy   Acute pyelonephritis   Sepsis (HCC)   STD exposure  Sepsis due to acute pyelonephritis 2/2 obstructive uropathy:  pt meets criteria for sepsis with leukocytosis with WBC 13.3, tachycardia with heart rate 92.  Patient status post cystoscopy with left ureteral stent placement Having significant pain associated with nephrolithiasis and stent Plan discussed with urology Plan: Continue IV antibiotics Continue IV fluids Multimodal to 1522 on 07/04/2020  STD exposure: -started doxycycline -HIV negative   DVT prophylaxis: SCDs Code Status:  Full Family Communication: None today Disposition Plan: Status is: Inpatient  Remains inpatient appropriate because:Inpatient level of care appropriate due to severity of illness   Dispo: The patient is from: Home              Anticipated d/c is to: Home              Anticipated d/c date is: 1 day              Patient currently is not medically stable to d/c.   Difficult to place patient No  Pain poorly controlled at the moment.  Multimodal pain control approach.  Possible discharge home on 07/04/2020 of pain is controlled     Level of care: Med-Surg  Consultants:   Urology  Procedures:   Cystoscopy, left ureteral stent placement, 07/02/2020  Antimicrobials:   Ceftriaxone   Subjective: Seen and examined.  Resting comfortably in bed.  Endorses severe pain in the left flank.  Objective: Vitals:   07/02/20 2134 07/02/20 2208 07/02/20 2309 07/03/20 0515  BP: 126/82 123/82 109/74 110/71  Pulse: 92 97 82 81  Resp:      Temp: 97.8 F (36.6 C) 97.9 F (36.6 C) 97.8 F (36.6 C) 98 F (36.7 C)  TempSrc: Oral Oral Oral Oral  SpO2: 100% 98% 96% 100%  Weight:      Height:        Intake/Output Summary (Last 24 hours) at 07/03/2020 0623 Last data filed at 07/03/2020 0421 Gross per 24 hour  Intake 3383.51 ml  Output 150 ml  Net 3233.51 ml   Filed Weights   07/01/20 2356  Weight: 114.8 kg  Examination:  General exam: Appears calm and comfortable  Respiratory system: Clear to auscultation. Respiratory effort normal. Cardiovascular system: S1 & S2 heard, RRR. No JVD, murmurs, rubs, gallops or clicks. No pedal edema. Gastrointestinal system: Tender to palpation left flank, abdomen soft, nondistended.  Positive bowel sounds Central nervous system: Alert and oriented. No focal neurological deficits. Extremities: Symmetric 5 x 5 power. Skin: No rashes, lesions or ulcers Psychiatry: Judgement and insight appear normal. Mood & affect appropriate.     Data Reviewed:  I have personally reviewed following labs and imaging studies  CBC: Recent Labs  Lab 07/02/20 0001 07/03/20 0521  WBC 13.3* 9.1  HGB 13.8 13.9  HCT 41.2 40.7  MCV 85.1 85.1  PLT 309 326   Basic Metabolic Panel: Recent Labs  Lab 07/02/20 0001 07/03/20 0521  NA 136 134*  K 3.8 4.3  CL 103 104  CO2 24 24  GLUCOSE 111* 157*  BUN 12 8  CREATININE 0.99 0.78  CALCIUM 9.0 8.9   GFR: Estimated Creatinine Clearance: 174 mL/min (by C-G formula based on SCr of 0.78 mg/dL). Liver Function Tests: No results for input(s): AST, ALT, ALKPHOS, BILITOT, PROT, ALBUMIN in the last 168 hours. No results for input(s): LIPASE, AMYLASE in the last 168 hours. No results for input(s): AMMONIA in the last 168 hours. Coagulation Profile: Recent Labs  Lab 07/02/20 0800  INR 1.1   Cardiac Enzymes: No results for input(s): CKTOTAL, CKMB, CKMBINDEX, TROPONINI in the last 168 hours. BNP (last 3 results) No results for input(s): PROBNP in the last 8760 hours. HbA1C: No results for input(s): HGBA1C in the last 72 hours. CBG: No results for input(s): GLUCAP in the last 168 hours. Lipid Profile: No results for input(s): CHOL, HDL, LDLCALC, TRIG, CHOLHDL, LDLDIRECT in the last 72 hours. Thyroid Function Tests: No results for input(s): TSH, T4TOTAL, FREET4, T3FREE, THYROIDAB in the last 72 hours. Anemia Panel: No results for input(s): VITAMINB12, FOLATE, FERRITIN, TIBC, IRON, RETICCTPCT in the last 72 hours. Sepsis Labs: Recent Labs  Lab 07/02/20 0800  PROCALCITON <0.10  LATICACIDVEN 1.1    Recent Results (from the past 240 hour(s))  Resp Panel by RT-PCR (Flu A&B, Covid) Nasopharyngeal Swab     Status: None   Collection Time: 07/02/20  5:41 AM   Specimen: Nasopharyngeal Swab; Nasopharyngeal(NP) swabs in vial transport medium  Result Value Ref Range Status   SARS Coronavirus 2 by RT PCR NEGATIVE NEGATIVE Final    Comment: (NOTE) SARS-CoV-2 target nucleic acids are NOT DETECTED.  The  SARS-CoV-2 RNA is generally detectable in upper respiratory specimens during the acute phase of infection. The lowest concentration of SARS-CoV-2 viral copies this assay can detect is 138 copies/mL. A negative result does not preclude SARS-Cov-2 infection and should not be used as the sole basis for treatment or other patient management decisions. A negative result may occur with  improper specimen collection/handling, submission of specimen other than nasopharyngeal swab, presence of viral mutation(s) within the areas targeted by this assay, and inadequate number of viral copies(<138 copies/mL). A negative result must be combined with clinical observations, patient history, and epidemiological information. The expected result is Negative.  Fact Sheet for Patients:  BloggerCourse.com  Fact Sheet for Healthcare Providers:  SeriousBroker.it  This test is no t yet approved or cleared by the [place] FDA and  has been authorized for detection and/or diagnosis of SARS-CoV-2 by FDA under an Emergency Use Authorization (EUA). This EUA will remain  in effect (meaning this test can  be used) for the duration of the COVID-19 declaration under Section 564(b)(1) of the Act, 21 U.S.C.section 360bbb-3(b)(1), unless the authorization is terminated  or revoked sooner.       Influenza A by PCR NEGATIVE NEGATIVE Final   Influenza B by PCR NEGATIVE NEGATIVE Final    Comment: (NOTE) The Xpert Xpress SARS-CoV-2/FLU/RSV plus assay is intended as an aid in the diagnosis of influenza from Nasopharyngeal swab specimens and should not be used as a sole basis for treatment. Nasal washings and aspirates are unacceptable for Xpert Xpress SARS-CoV-2/FLU/RSV testing.  Fact Sheet for Patients: BloggerCourse.com  Fact Sheet for Healthcare Providers: SeriousBroker.it  This test is not yet approved or  cleared by the [place] FDA and has been authorized for detection and/or diagnosis of SARS-CoV-2 by FDA under an Emergency Use Authorization (EUA). This EUA will remain in effect (meaning this test can be used) for the duration of the COVID-19 declaration under Section 564(b)(1) of the Act, 21 U.S.C. section 360bbb-3(b)(1), unless the authorization is terminated or revoked.  Performed at [facility], [address] [place], [place] [address]          Radiology Studies: DG Abdomen 1 View  Result Date: 07/02/2020 CLINICAL DATA:  Left flank pain.  Recent lithotripsy. EXAM: ABDOMEN - 1 VIEW COMPARISON:  Radiograph 06/22/2020.  Most recent CT 06/09/2020 FINDINGS: Previous stone in the region of the left ureteropelvic junction is no longer seen. No definite stone or stone fragments along the course of the ureter or in the pelvis. No evidence of radiopaque calculi. No bowel dilatation. Occasional air-fluid levels within nondilated small bowel in the central abdomen. No free intra-abdominal air. Lung bases are clear. No acute osseous abnormalities are seen. IMPRESSION: Previous left UPJ calculus no longer seen. No radiograph evidence of stone or stone fragments along the course of the ureters or in the pelvis. Electronically Signed   By: Narda Rutherford M.D.   On: 07/02/2020 00:30   DG OR UROLOGY CYSTO IMAGE ([facility] ONLY)  Result Date: 07/02/2020 There is no interpretation for this exam.  This order is for images obtained during a surgical procedure.  Please See "Surgeries" Tab for more information regarding the procedure.   DG OR UROLOGY CYSTO IMAGE ([facility] ONLY)  Result Date: 07/02/2020 There is no interpretation for this exam.  This order is for images obtained during a surgical procedure.  Please See "Surgeries" Tab for more information regarding the procedure.   DG OR UROLOGY CYSTO IMAGE ([facility] ONLY)  Result Date: 07/02/2020 There is no interpretation for this exam.  This  order is for images obtained during a surgical procedure.  Please See "Surgeries" Tab for more information regarding the procedure.   CT Renal Stone Study  Result Date: 07/02/2020 CLINICAL DATA:  Left flank pain.  Previous lithotripsy. EXAM: CT ABDOMEN AND PELVIS WITHOUT CONTRAST TECHNIQUE: Multidetector CT imaging of the abdomen and pelvis was performed following the standard protocol without IV contrast. COMPARISON:  06/09/2020 FINDINGS: Lower chest: Normal Hepatobiliary: Normal Pancreas: Normal Spleen: Normal Adrenals/Urinary Tract: Adrenal glands are normal. Right kidney shows a 3 mm stone at the UPJ without gross hydronephrosis. Left kidney shows a 6 mm stone at the left UPJ with mild fullness of the renal collecting system. Nonobstructing 3 mm stone in the lower pole. No stone more distal in either ureter. No stone in the bladder. Stomach/Bowel: Stomach and small intestine are normal. No colon pathology. Normal appendix. Vascular/Lymphatic: Normal Reproductive: Normal Other: No free fluid or  air. Musculoskeletal: Normal IMPRESSION: 1. 6 mm stone at the left UPJ/proximal ureter with mild fullness of the renal collecting system. 3 mm nonobstructing stone in the lower pole of the left kidney. 2. 3 mm stone at the right UPJ/proximal ureter without appreciable hydronephrosis. Electronically Signed   By: Paulina Fusi M.D.   On: 07/02/2020 03:56        Scheduled Meds: . doxycycline  100 mg Oral Q12H  . tamsulosin  0.4 mg Oral Daily   Continuous Infusions: . sodium chloride 250 mL (07/03/20 0618)  . cefTRIAXone (ROCEPHIN)  IV    . cefTRIAXone (ROCEPHIN)  IV 2 g (07/03/20 3762)  . lactated ringers 125 mL/hr at 07/03/20 0616     LOS: 1 day    Time spent: 25 minutes    Tresa Moore, MD [facility] Pager [phone]  If 7PM-7AM, please contact night-coverage 07/03/2020, 6:23 AM

## 2020-07-04 LAB — GLUCOSE, CAPILLARY: Glucose-Capillary: 110 mg/dL — ABNORMAL HIGH (ref 70–99)

## 2020-07-04 MED ORDER — LACTATED RINGERS IV SOLN: INTRAVENOUS | Status: DC

## 2020-07-04 MED ORDER — HYDROMORPHONE HCL 1 MG/ML IJ SOLN
1.5000 mg | INTRAMUSCULAR | Status: DC | PRN
  Administered 2020-07-04 – 2020-07-05 (×7): 1.5 mg via INTRAVENOUS
  Filled 2020-07-04 (×8): qty 1.5

## 2020-07-04 NOTE — Progress Notes (Signed)
PROGRESS NOTE    Jacob Nixon.  UXL:[id number] DOB: 1991/04/30 DOA: 07/02/2020 PCP: Patient, No Pcp Per   Brief Narrative:  30 y.o. male with medical history significant of kidney stone, who presents with left flank pain.  Patient recently underwent lithotripsy treatment for left UPJ stone by Dr. Apolinar Junes on 06/22/2020. He reports passing several fragments. Had no pain until yesterday afternoon when he developed sudden onset of left flank pain, which is severe, sharp, radiating to front area, associated with nausea and vomiting.  He has had at least 5 times with nonbilious nonbloody vomiting.  Denies diarrhea or abdominal pain.  No fever or chills.  Denies symptoms of UTI.  No chest pain, cough, shortness breath. Patient is also requesting treatment for chlamydia. Reports that one of his sexual partners called him 2 days ago to tell him they tested positive for chlamydia. He denies any symptoms of penile discharge or dysuria.  Urology consulted from admission.  Patient underwent ESWL for management of 5 mm left UPJ stone.  Readmitted with persistent left UVJ stone with associated urinary tract infection and new 3 mm right proximal ureteral stone.    Patient having significant pain associated with stent symptoms.  He was seen in follow-up by urology.  Currently there is a plan for left ureteroscopy with laser lithotripsy on Thursday, 07/06/2020.  Patient has no sign of infection and theoretically could be discharged however he continues to endorse severe pain and at this point his pain control precludes a safe discharge.     Assessment & Plan:   Principal Problem:   Obstructive uropathy Active Problems:   Acute unilateral obstructive uropathy   Acute pyelonephritis   Sepsis (HCC)   STD exposure  Sepsis due to acute pyelonephritis 2/2 obstructive uropathy:  pt meets criteria for sepsis with leukocytosis with WBC 13.3, tachycardia with heart rate 92.  Patient status post cystoscopy  with left ureteral stent placement Having significant pain associated with nephrolithiasis and stent Plan discussed with urology Plan to return to the OR on Thursday for ureteroscopy and laser lithotripsy Ideally this should help with his pain control Only reason for continued hospital stay is pain management All cultures negative, no indication for antibiotics Plan: DC IV antibiotics Continue IV fluids, rate decreased Multimodal pain control.  Currently patient is on rather aggressive IV Dilaudid.  I had a lengthy discussion with him and he is aware that we cannot continue this medication long-term.  We will need to start tapering this medication by tomorrow.  STD exposure: -started doxycycline -HIV negative   DVT prophylaxis: SCDs Code Status: Full Family Communication: None today Disposition Plan: Status is: Inpatient  Remains inpatient appropriate because:Ongoing active pain requiring inpatient pain management and Inpatient level of care appropriate due to severity of illness   Dispo: The patient is from: Home              Anticipated d/c is to: Home              Anticipated d/c date is: 2 days              Patient currently is not medically stable to d/c.   Difficult to place patient No   Having issues with pain control.  Currently patient is on IV Dilaudid.  Unable to wean yet.  Plan for ureteroscopy and laser lithotripsy on Thursday, 07/07/2019.  Anticipate the patient will remain in the hospital up until the procedure.  Level of care: Med-Surg  Consultants:   Urology  Procedures:   Cystoscopy, left ureteral stent placement, 07/02/2020  Antimicrobials:   Ceftriaxone   Subjective: Seen and examined.  Resting comfortably in bed.  Endorses severe pain in the left flank.  Objective: Vitals:   07/03/20 2303 07/04/20 0435 07/04/20 0958 07/04/20 1200  BP: (!) 124/59 102/61 118/70 105/74  Pulse: 61 65 62 79  Resp: 16 15 17 20   Temp: 97.9 F  (36.6 C) 97.9 F (36.6 C) 97.9 F (36.6 C) 97.7 F (36.5 C)  TempSrc: Oral Oral Oral Oral  SpO2: 98% 98% 100% 99%  Weight:      Height:        Intake/Output Summary (Last 24 hours) at 07/04/2020 1312 Last data filed at 07/04/2020 0900 Gross per 24 hour  Intake 240 ml  Output --  Net 240 ml   Filed Weights   07/01/20 2356  Weight: 114.8 kg    Examination:  General exam: Appears calm and comfortable  Respiratory system: Clear to auscultation. Respiratory effort normal. Cardiovascular system: S1 & S2 heard, RRR. No JVD, murmurs, rubs, gallops or clicks. No pedal edema. Gastrointestinal system: Tender to palpation left flank, abdomen soft, nondistended.  Positive bowel sounds Central nervous system: Alert and oriented. No focal neurological deficits. Extremities: Symmetric 5 x 5 power. Skin: No rashes, lesions or ulcers Psychiatry: Judgement and insight appear normal. Mood & affect appropriate.     Data Reviewed: I have personally reviewed following labs and imaging studies  CBC: Recent Labs  Lab 07/02/20 0001 07/03/20 0521  WBC 13.3* 9.1  HGB 13.8 13.9  HCT 41.2 40.7  MCV 85.1 85.1  PLT 309 326   Basic Metabolic Panel: Recent Labs  Lab 07/02/20 0001 07/03/20 0521  NA 136 134*  K 3.8 4.3  CL 103 104  CO2 24 24  GLUCOSE 111* 157*  BUN 12 8  CREATININE 0.99 0.78  CALCIUM 9.0 8.9   GFR: Estimated Creatinine Clearance: 174 mL/min (by C-G formula based on SCr of 0.78 mg/dL). Liver Function Tests: No results for input(s): AST, ALT, ALKPHOS, BILITOT, PROT, ALBUMIN in the last 168 hours. No results for input(s): LIPASE, AMYLASE in the last 168 hours. No results for input(s): AMMONIA in the last 168 hours. Coagulation Profile: Recent Labs  Lab 07/02/20 0800  INR 1.1   Cardiac Enzymes: No results for input(s): CKTOTAL, CKMB, CKMBINDEX, TROPONINI in the last 168 hours. BNP (last 3 results) No results for input(s): PROBNP in the last 8760 hours. HbA1C: No  results for input(s): HGBA1C in the last 72 hours. CBG: Recent Labs  Lab 07/04/20 0843  GLUCAP 110*   Lipid Profile: No results for input(s): CHOL, HDL, LDLCALC, TRIG, CHOLHDL, LDLDIRECT in the last 72 hours. Thyroid Function Tests: No results for input(s): TSH, T4TOTAL, FREET4, T3FREE, THYROIDAB in the last 72 hours. Anemia Panel: No results for input(s): VITAMINB12, FOLATE, FERRITIN, TIBC, IRON, RETICCTPCT in the last 72 hours. Sepsis Labs: Recent Labs  Lab 07/02/20 0800  PROCALCITON <0.10  LATICACIDVEN 1.1    Recent Results (from the past 240 hour(s))  Urine Culture     Status: None   Collection Time: 07/02/20 12:01 AM   Specimen: Urine, Clean Catch  Result Value Ref Range Status   Specimen Description   Final    URINE, CLEAN CATCH Performed at [facility], [address] [place], [place]    Special Requests   Final    NONE Performed at [facility], [address] [place], [place] [address]    Culture   Final    NO GROWTH Performed at [facility], [address] [place], [place] [address]    Report Status 07/03/2020 FINAL  Final  Resp Panel by RT-PCR (Flu A&B, Covid) Nasopharyngeal Swab     Status: None   Collection Time: 07/02/20  5:41 AM   Specimen: Nasopharyngeal Swab; Nasopharyngeal(NP) swabs in vial transport medium  Result Value Ref Range Status   SARS Coronavirus 2 by RT PCR NEGATIVE NEGATIVE Final    Comment: (NOTE) SARS-CoV-2 target nucleic acids are NOT DETECTED.  The SARS-CoV-2 RNA is generally detectable in upper respiratory specimens during the acute phase of infection. The lowest concentration of SARS-CoV-2 viral copies this assay can detect is 138 copies/mL. A negative result does not preclude SARS-Cov-2 infection and should not be used as the sole basis for treatment or other patient management decisions. A negative result may occur with  improper specimen collection/handling, submission of specimen  other than nasopharyngeal swab, presence of viral mutation(s) within the areas targeted by this assay, and inadequate number of viral copies(<138 copies/mL). A negative result must be combined with clinical observations, patient history, and epidemiological information. The expected result is Negative.  Fact Sheet for Patients:  BloggerCourse.comhttps://www.fda.gov/media/152166/download  Fact Sheet for Healthcare Providers:  SeriousBroker.ithttps://www.fda.gov/media/152162/download  This test is no t yet approved or cleared by the [place] FDA and  has been authorized for detection and/or diagnosis of SARS-CoV-2 by FDA under an Emergency Use Authorization (EUA). This EUA will remain  in effect (meaning this test can be used) for the duration of the COVID-19 declaration under Section 564(b)(1) of the Act, 21 U.S.C.section 360bbb-3(b)(1), unless the authorization is terminated  or revoked sooner.       Influenza A by PCR NEGATIVE NEGATIVE Final   Influenza B by PCR NEGATIVE NEGATIVE Final    Comment: (NOTE) The Xpert Xpress SARS-CoV-2/FLU/RSV plus assay is intended as an aid in the diagnosis of influenza from Nasopharyngeal swab specimens and should not be used as a sole basis for treatment. Nasal washings and aspirates are unacceptable for Xpert Xpress SARS-CoV-2/FLU/RSV testing.  Fact Sheet for Patients: BloggerCourse.comhttps://www.fda.gov/media/152166/download  Fact Sheet for Healthcare Providers: SeriousBroker.ithttps://www.fda.gov/media/152162/download  This test is not yet approved or cleared by the [place] FDA and has been authorized for detection and/or diagnosis of SARS-CoV-2 by FDA under an Emergency Use Authorization (EUA). This EUA will remain in effect (meaning this test can be used) for the duration of the COVID-19 declaration under Section 564(b)(1) of the Act, 21 U.S.C. section 360bbb-3(b)(1), unless the authorization is terminated or revoked.  Performed at [facility], [address]  [place], [place] [address]   Culture, blood (x 2)     Status: None (Preliminary result)   Collection Time: 07/02/20  8:00 AM   Specimen: BLOOD  Result Value Ref Range Status   Specimen Description BLOOD RIGHT ANTECUBITAL  Final   Special Requests   Final    BOTTLES DRAWN AEROBIC AND ANAEROBIC Blood Culture adequate volume   Culture   Final    NO GROWTH 2 DAYS Performed at [facility], [address] [place], [place] [address]    Report Status PENDING  Incomplete  Culture, blood (x 2)     Status: None (Preliminary result)   Collection Time: 07/02/20  8:00 AM   Specimen: BLOOD  Result Value Ref Range Status   Specimen Description BLOOD LEFT ANTECUBITAL  Final   Special Requests   Final    BOTTLES DRAWN AEROBIC AND ANAEROBIC Blood Culture adequate volume   Culture   Final    NO GROWTH 2 DAYS Performed at [facility], [address] [place], [place] [address]    Report Status PENDING  Incomplete  Urine Culture     Status: None   Collection Time: 07/02/20  4:26 PM   Specimen: Kidney; Urine  Result Value Ref Range Status   Specimen Description   Final    KIDNEY left kidney Performed at [facility], [address] [place], [place] [address]    Special Requests   Final    clindamycin, rocephin Performed at [facility], [address], [place], [place] [address]    Culture   Final    NO GROWTH Performed at [facility], [address], [place], [place] [address]    Report Status 07/03/2020 FINAL  Final         Radiology Studies: DG OR UROLOGY CYSTO IMAGE [facility] ONLY)  Result Date: 07/02/2020 There is no interpretation for this exam.  This order is for images obtained during a surgical procedure.  Please See "Surgeries" Tab for more information regarding the procedure.   DG OR UROLOGY CYSTO IMAGE ([facility] ONLY)  Result Date: 07/02/2020 There is no interpretation for this exam.  This order is for images obtained during a  surgical procedure.  Please See "Surgeries" Tab for more information regarding the procedure.   DG OR UROLOGY CYSTO IMAGE ([facility] ONLY)  Result Date: 07/02/2020 There is no interpretation for this exam.  This order is for images obtained during a surgical procedure.  Please See "Surgeries" Tab for more information regarding the procedure.        Scheduled Meds: . doxycycline  100 mg Oral Q12H  . melatonin  3 mg Oral QHS  . oxybutynin  5 mg Oral TID  . tamsulosin  0.8 mg Oral Daily  . traZODone  50 mg Oral QHS   Continuous Infusions: . sodium chloride Stopped (07/03/20 0619)  . [START ON 07/07/2020] lactated ringers       LOS: 2 days    Time spent: 25 minutes    Tresa Moore, MD [facility] Pager [phone]  If 7PM-7AM, please contact night-coverage 07/04/2020, 1:12 PM

## 2020-07-04 NOTE — H&P (View-Only) (Signed)
Urology Inpatient Progress Note  Subjective: No acute events overnight. He is afebrile, VSS. Urine cultures finalized negative. Blood cultures with no growth at 2 days. On antibiotics as below. No labs available today. Patient continues to report left flank pain associated with his left-sided stent. He reports occasional twinges of right flank pain that is not terribly bothersome for him. He is concerned about his ability to control his pain on an outpatient basis while his left ureteral stent is in place. Additionally, he reports he will not be able to remove the ureteral stent left on tether by himself and requests to be sedated for removal.  Anti-infectives: Anti-infectives (From admission, onward)   Start     Dose/Rate Route Frequency Ordered Stop   07/03/20 0700  cefTRIAXone (ROCEPHIN) 2 g in sodium chloride 0.9 % 100 mL IVPB        2 g 200 mL/hr over 30 Minutes Intravenous Every 24 hours 07/02/20 0726     07/02/20 1500  doxycycline (VIBRA-TABS) tablet 100 mg        100 mg Oral Every 12 hours 07/02/20 0726     07/02/20 0730  cefTRIAXone (ROCEPHIN) 1 g in sodium chloride 0.9 % 100 mL IVPB        1 g 200 mL/hr over 30 Minutes Intravenous  Once 07/02/20 0725     07/02/20 0315  cefTRIAXone (ROCEPHIN) 500 mg in dextrose 5 % 50 mL IVPB  Status:  Discontinued        500 mg 100 mL/hr over 30 Minutes Intravenous  Once 07/02/20 0303 07/02/20 0309   07/02/20 0315  doxycycline (VIBRA-TABS) tablet 100 mg        100 mg Oral  Once 07/02/20 0303 07/02/20 0309   07/02/20 0315  cefTRIAXone (ROCEPHIN) 1 g in sodium chloride 0.9 % 100 mL IVPB        1 g 200 mL/hr over 30 Minutes Intravenous  Once 07/02/20 0309 07/02/20 0544   07/02/20 0000  doxycycline (VIBRAMYCIN) 100 MG capsule        100 mg Oral 2 times daily 07/02/20 0443 07/09/20 2359   07/02/20 0000  cephALEXin (KEFLEX) 500 MG capsule        500 mg Oral 2 times daily 07/02/20 0443 07/09/20 2359      Current Facility-Administered Medications   Medication Dose Route Frequency Provider Last Rate Last Admin  . 0.9 %  sodium chloride infusion   Intravenous PRN Sidney Ace, MD   Stopped at 07/03/20 [phone]  . acetaminophen (TYLENOL) tablet 650 mg  650 mg Oral Q6H PRN Ivor Costa, MD      . cefTRIAXone (ROCEPHIN) 1 g in sodium chloride 0.9 % 100 mL IVPB  1 g Intravenous Once Ivor Costa, MD      . cefTRIAXone (ROCEPHIN) 2 g in sodium chloride 0.9 % 100 mL IVPB  2 g Intravenous Q24H Ivor Costa, MD 200 mL/hr at 07/04/20 0539 2 g at 07/04/20 0539  . doxycycline (VIBRA-TABS) tablet 100 mg  100 mg Oral Q12H Ivor Costa, MD   100 mg at 07/04/20 4496  . HYDROmorphone (DILAUDID) injection 1 mg  1 mg Intravenous Q3H PRN Ralene Muskrat B, MD   1 mg at 07/04/20 0828  . lactated ringers infusion   Intravenous Continuous Ivor Costa, MD 125 mL/hr at 07/03/20 2311 New Bag at 07/03/20 2311  . melatonin tablet 3 mg  3 mg Oral QHS Ralene Muskrat B, MD   3 mg at 07/03/20 2101  .  ondansetron (ZOFRAN) injection 4 mg  4 mg Intravenous Q8H PRN Ivor Costa, MD   4 mg at 07/03/20 0049  . oxybutynin (DITROPAN) tablet 5 mg  5 mg Oral TID Ralene Muskrat B, MD   5 mg at 07/04/20 0828  . oxyCODONE-acetaminophen (PERCOCET/ROXICET) 5-325 MG per tablet 1 tablet  1 tablet Oral Q4H PRN Ivor Costa, MD   1 tablet at 07/03/20 2106  . tamsulosin (FLOMAX) capsule 0.8 mg  0.8 mg Oral Daily Priscella Mann, Sudheer B, MD   0.8 mg at 07/04/20 0828  . traZODone (DESYREL) tablet 25-50 mg  25-50 mg Oral QHS PRN Ivor Costa, MD      . traZODone (DESYREL) tablet 50 mg  50 mg Oral QHS Ralene Muskrat B, MD   50 mg at 07/03/20 2100   Objective: Vital signs in last 24 hours: Temp:  [97.9 F (36.6 C)-98.2 F (36.8 C)] 97.9 F (36.6 C) (02/15 0435) Pulse Rate:  [61-94] 65 (02/15 0435) Resp:  [15-17] 15 (02/15 0435) BP: (102-125)/(59-76) 102/61 (02/15 0435) SpO2:  [98 %-100 %] 98 % (02/15 0435)  Intake/Output from previous day: No intake/output data recorded. Intake/Output this  shift: No intake/output data recorded.  Physical Exam Vitals and nursing note reviewed.  Constitutional:      Comments: Uncomfortable appearing  Pulmonary:     Effort: Pulmonary effort is normal. No respiratory distress.  Skin:    General: Skin is warm and dry.  Neurological:     Mental Status: He is alert and oriented to person, place, and time.  Psychiatric:        Mood and Affect: Mood normal.        Behavior: Behavior normal.    Lab Results:  Recent Labs    07/02/20 0001 07/03/20 0521  WBC 13.3* 9.1  HGB 13.8 13.9  HCT 41.2 40.7  PLT 309 326   BMET Recent Labs    07/02/20 0001 07/03/20 0521  NA 136 134*  K 3.8 4.3  CL 103 104  CO2 24 24  GLUCOSE 111* 157*  BUN 12 8  CREATININE 0.99 0.78  CALCIUM 9.0 8.9   PT/INR Recent Labs    07/02/20 0800  LABPROT 13.5  INR 1.1   Assessment & Plan: 30 year old male with persistent 5-6 mm left UVJ stone following ESWL with possible urinary infection, cultures negative, now s/p left ureteral stent placement.  Also with a new 3 mm right proximal ureteral stone.  Patient is minimally symptomatic of his right-sided stone but continues to struggle with poor pain control associated with his left ureteral stent.  I offered him left ureteroscopy with laser lithotripsy and stent exchange with Dr. Erlene Quan this Thursday, 07/06/2020.  Patient is in agreement with this plan, booking sheet completed today.  I explained to the patient that if his new left ureteral stent is left on a tether and he is unable to remove it himself, he will have to wait longer than 3 days to have it removed by me in clinic early next week.  I further explained that we do not sedate patients for stent removal, even if they require cystoscopy to do so.  He expressed understanding  I recommend continued MET for management of his right ureteral stone given recent failure of ESWL on the left, radiolucency of the stone on recent KUB dated 07/02/2020, and poor  tolerance of ureteral stent.  Ureteroscopy this week can be performed on an inpatient or outpatient basis per primary team preferences with regard to patient's  pain control.  Recommendations: -Continue Flomax 0.8 mg daily and oxybutynin 5 mg every 8 hours for both MET and management of left-sided stent symptoms -Pain control as needed -Okay to discontinue empiric antibiotics with negative urine cultures and clinical stability -Left ureteroscopy with laser lithotripsy and stent exchange with Dr. Erlene Quan in 2 days, 07/06/2020  Debroah Loop, PA-C 07/04/2020

## 2020-07-04 NOTE — Progress Notes (Signed)
Urology Inpatient Progress Note  Subjective: No acute events overnight. He is afebrile, VSS. Urine cultures finalized negative. Blood cultures with no growth at 2 days. On antibiotics as below. No labs available today. Patient continues to report left flank pain associated with his left-sided stent. He reports occasional twinges of right flank pain that is not terribly bothersome for him. He is concerned about his ability to control his pain on an outpatient basis while his left ureteral stent is in place. Additionally, he reports he will not be able to remove the ureteral stent left on tether by himself and requests to be sedated for removal.  Anti-infectives: Anti-infectives (From admission, onward)   Start     Dose/Rate Route Frequency Ordered Stop   07/03/20 0700  cefTRIAXone (ROCEPHIN) 2 g in sodium chloride 0.9 % 100 mL IVPB        2 g 200 mL/hr over 30 Minutes Intravenous Every 24 hours 07/02/20 0726     07/02/20 1500  doxycycline (VIBRA-TABS) tablet 100 mg        100 mg Oral Every 12 hours 07/02/20 0726     07/02/20 0730  cefTRIAXone (ROCEPHIN) 1 g in sodium chloride 0.9 % 100 mL IVPB        1 g 200 mL/hr over 30 Minutes Intravenous  Once 07/02/20 0725     07/02/20 0315  cefTRIAXone (ROCEPHIN) 500 mg in dextrose 5 % 50 mL IVPB  Status:  Discontinued        500 mg 100 mL/hr over 30 Minutes Intravenous  Once 07/02/20 0303 07/02/20 0309   07/02/20 0315  doxycycline (VIBRA-TABS) tablet 100 mg        100 mg Oral  Once 07/02/20 0303 07/02/20 0309   07/02/20 0315  cefTRIAXone (ROCEPHIN) 1 g in sodium chloride 0.9 % 100 mL IVPB        1 g 200 mL/hr over 30 Minutes Intravenous  Once 07/02/20 0309 07/02/20 0544   07/02/20 0000  doxycycline (VIBRAMYCIN) 100 MG capsule        100 mg Oral 2 times daily 07/02/20 0443 07/09/20 2359   07/02/20 0000  cephALEXin (KEFLEX) 500 MG capsule        500 mg Oral 2 times daily 07/02/20 0443 07/09/20 2359      Current Facility-Administered Medications   Medication Dose Route Frequency Provider Last Rate Last Admin  . 0.9 %  sodium chloride infusion   Intravenous PRN Sidney Ace, MD   Stopped at 07/03/20 [phone]  . acetaminophen (TYLENOL) tablet 650 mg  650 mg Oral Q6H PRN Ivor Costa, MD      . cefTRIAXone (ROCEPHIN) 1 g in sodium chloride 0.9 % 100 mL IVPB  1 g Intravenous Once Ivor Costa, MD      . cefTRIAXone (ROCEPHIN) 2 g in sodium chloride 0.9 % 100 mL IVPB  2 g Intravenous Q24H Ivor Costa, MD 200 mL/hr at 07/04/20 0539 2 g at 07/04/20 0539  . doxycycline (VIBRA-TABS) tablet 100 mg  100 mg Oral Q12H Ivor Costa, MD   100 mg at 07/04/20 6811  . HYDROmorphone (DILAUDID) injection 1 mg  1 mg Intravenous Q3H PRN Ralene Muskrat B, MD   1 mg at 07/04/20 0828  . lactated ringers infusion   Intravenous Continuous Ivor Costa, MD 125 mL/hr at 07/03/20 2311 New Bag at 07/03/20 2311  . melatonin tablet 3 mg  3 mg Oral QHS Ralene Muskrat B, MD   3 mg at 07/03/20 2101  .  ondansetron (ZOFRAN) injection 4 mg  4 mg Intravenous Q8H PRN Ivor Costa, MD   4 mg at 07/03/20 0049  . oxybutynin (DITROPAN) tablet 5 mg  5 mg Oral TID Ralene Muskrat B, MD   5 mg at 07/04/20 0828  . oxyCODONE-acetaminophen (PERCOCET/ROXICET) 5-325 MG per tablet 1 tablet  1 tablet Oral Q4H PRN Ivor Costa, MD   1 tablet at 07/03/20 2106  . tamsulosin (FLOMAX) capsule 0.8 mg  0.8 mg Oral Daily Priscella Mann, Sudheer B, MD   0.8 mg at 07/04/20 0828  . traZODone (DESYREL) tablet 25-50 mg  25-50 mg Oral QHS PRN Ivor Costa, MD      . traZODone (DESYREL) tablet 50 mg  50 mg Oral QHS Ralene Muskrat B, MD   50 mg at 07/03/20 2100   Objective: Vital signs in last 24 hours: Temp:  [97.9 F (36.6 C)-98.2 F (36.8 C)] 97.9 F (36.6 C) (02/15 0435) Pulse Rate:  [61-94] 65 (02/15 0435) Resp:  [15-17] 15 (02/15 0435) BP: (102-125)/(59-76) 102/61 (02/15 0435) SpO2:  [98 %-100 %] 98 % (02/15 0435)  Intake/Output from previous day: No intake/output data recorded. Intake/Output this  shift: No intake/output data recorded.  Physical Exam Vitals and nursing note reviewed.  Constitutional:      Comments: Uncomfortable appearing  Pulmonary:     Effort: Pulmonary effort is normal. No respiratory distress.  Skin:    General: Skin is warm and dry.  Neurological:     Mental Status: He is alert and oriented to person, place, and time.  Psychiatric:        Mood and Affect: Mood normal.        Behavior: Behavior normal.    Lab Results:  Recent Labs    07/02/20 0001 07/03/20 0521  WBC 13.3* 9.1  HGB 13.8 13.9  HCT 41.2 40.7  PLT 309 326   BMET Recent Labs    07/02/20 0001 07/03/20 0521  NA 136 134*  K 3.8 4.3  CL 103 104  CO2 24 24  GLUCOSE 111* 157*  BUN 12 8  CREATININE 0.99 0.78  CALCIUM 9.0 8.9   PT/INR Recent Labs    07/02/20 0800  LABPROT 13.5  INR 1.1   Assessment & Plan: 30 year old male with persistent 5-6 mm left UVJ stone following ESWL with possible urinary infection, cultures negative, now s/p left ureteral stent placement.  Also with a new 3 mm right proximal ureteral stone.  Patient is minimally symptomatic of his right-sided stone but continues to struggle with poor pain control associated with his left ureteral stent.  I offered him left ureteroscopy with laser lithotripsy and stent exchange with Dr. Erlene Quan this Thursday, 07/06/2020.  Patient is in agreement with this plan, booking sheet completed today.  I explained to the patient that if his new left ureteral stent is left on a tether and he is unable to remove it himself, he will have to wait longer than 3 days to have it removed by me in clinic early next week.  I further explained that we do not sedate patients for stent removal, even if they require cystoscopy to do so.  He expressed understanding  I recommend continued MET for management of his right ureteral stone given recent failure of ESWL on the left, radiolucency of the stone on recent KUB dated 07/02/2020, and poor  tolerance of ureteral stent.  Ureteroscopy this week can be performed on an inpatient or outpatient basis per primary team preferences with regard to patient's  pain control.  Recommendations: -Continue Flomax 0.8 mg daily and oxybutynin 5 mg every 8 hours for both MET and management of left-sided stent symptoms -Pain control as needed -Okay to discontinue empiric antibiotics with negative urine cultures and clinical stability -Left ureteroscopy with laser lithotripsy and stent exchange with Dr. Erlene Quan in 2 days, 07/06/2020  Debroah Loop, PA-C 07/04/2020

## 2020-07-05 ENCOUNTER — Other Ambulatory Visit: Payer: Self-pay | Admitting: Urology

## 2020-07-05 MED ORDER — DIPHENHYDRAMINE HCL 50 MG/ML IJ SOLN: 12.5000 mg | Freq: Four times a day (QID) | INTRAMUSCULAR | Status: DC | PRN

## 2020-07-05 MED ORDER — HYDROMORPHONE HCL 1 MG/ML IJ SOLN
1.5000 mg | Freq: Once | INTRAMUSCULAR | Status: AC
  Administered 2020-07-05: 1.5 mg via INTRAVENOUS
  Filled 2020-07-05: qty 1.5

## 2020-07-05 MED ORDER — HYDROMORPHONE 1 MG/ML IV SOLN
INTRAVENOUS | Status: DC
  Administered 2020-07-06: 25 mg via INTRAVENOUS
  Filled 2020-07-05: qty 30

## 2020-07-05 MED ORDER — DIPHENHYDRAMINE HCL 12.5 MG/5ML PO ELIX
12.5000 mg | ORAL_SOLUTION | Freq: Four times a day (QID) | ORAL | Status: DC | PRN
  Filled 2020-07-05: qty 5

## 2020-07-05 MED ORDER — SODIUM CHLORIDE 0.9% FLUSH: 9.0000 mL | INTRAVENOUS | Status: DC | PRN

## 2020-07-05 MED ORDER — HYDROMORPHONE 1 MG/ML IV SOLN
INTRAVENOUS | Status: DC
  Administered 2020-07-05 (×2): 25 mg via INTRAVENOUS
  Filled 2020-07-05 (×2): qty 30
  Filled 2020-07-05 (×3): qty 25

## 2020-07-05 MED ORDER — NALOXONE HCL 0.4 MG/ML IJ SOLN: 0.4000 mg | INTRAMUSCULAR | Status: DC | PRN

## 2020-07-05 MED ORDER — KETOROLAC TROMETHAMINE 15 MG/ML IJ SOLN
15.0000 mg | Freq: Three times a day (TID) | INTRAMUSCULAR | Status: DC
  Administered 2020-07-05 – 2020-07-06 (×3): 15 mg via INTRAVENOUS
  Filled 2020-07-05 (×3): qty 1

## 2020-07-05 MED ORDER — ACETAMINOPHEN 500 MG PO TABS
500.0000 mg | ORAL_TABLET | Freq: Three times a day (TID) | ORAL | Status: DC
  Administered 2020-07-05 – 2020-07-07 (×5): 500 mg via ORAL
  Filled 2020-07-05 (×6): qty 1

## 2020-07-05 NOTE — Progress Notes (Signed)
Urology Consult Follow Up  Subjective: Patient still experiencing left sided flank pain that radiates to the left groin.  It is worsened by voiding.  He only has mild pressure on the right.    No acute events overnight.  Afebrile VSS.   Urine culture and blood cultures negative.    No new labs.   Anti-infectives: Anti-infectives (From admission, onward)   Start     Dose/Rate Route Frequency Ordered Stop   07/03/20 0700  cefTRIAXone (ROCEPHIN) 2 g in sodium chloride 0.9 % 100 mL IVPB  Status:  Discontinued        2 g 200 mL/hr over 30 Minutes Intravenous Every 24 hours 07/02/20 0726 07/04/20 1001   07/02/20 1500  doxycycline (VIBRA-TABS) tablet 100 mg        100 mg Oral Every 12 hours 07/02/20 0726     07/02/20 0730  cefTRIAXone (ROCEPHIN) 1 g in sodium chloride 0.9 % 100 mL IVPB  Status:  Discontinued        1 g 200 mL/hr over 30 Minutes Intravenous  Once 07/02/20 0725 07/04/20 1001   07/02/20 0315  cefTRIAXone (ROCEPHIN) 500 mg in dextrose 5 % 50 mL IVPB  Status:  Discontinued        500 mg 100 mL/hr over 30 Minutes Intravenous  Once 07/02/20 0303 07/02/20 0309   07/02/20 0315  doxycycline (VIBRA-TABS) tablet 100 mg        100 mg Oral  Once 07/02/20 0303 07/02/20 0309   07/02/20 0315  cefTRIAXone (ROCEPHIN) 1 g in sodium chloride 0.9 % 100 mL IVPB        1 g 200 mL/hr over 30 Minutes Intravenous  Once 07/02/20 0309 07/02/20 0544   07/02/20 0000  doxycycline (VIBRAMYCIN) 100 MG capsule        100 mg Oral 2 times daily 07/02/20 0443 07/09/20 2359   07/02/20 0000  cephALEXin (KEFLEX) 500 MG capsule        500 mg Oral 2 times daily 07/02/20 0443 07/09/20 2359      Current Facility-Administered Medications  Medication Dose Route Frequency Provider Last Rate Last Admin  . 0.9 %  sodium chloride infusion   Intravenous PRN Tresa Moore, MD   Stopped at 07/03/20 [phone]  . acetaminophen (TYLENOL) tablet 650 mg  650 mg Oral Q6H PRN Lorretta Harp, MD      . doxycycline (VIBRA-TABS)  tablet 100 mg  100 mg Oral Q12H Lorretta Harp, MD   100 mg at 07/04/20 2307  . HYDROmorphone (DILAUDID) injection 1.5 mg  1.5 mg Intravenous Q3H PRN Lolita Patella B, MD   1.5 mg at 07/05/20 0519  . [START ON 07/07/2020] lactated ringers infusion   Intravenous Continuous Sreenath, Sudheer B, MD      . melatonin tablet 3 mg  3 mg Oral QHS Lolita Patella B, MD   3 mg at 07/04/20 2307  . ondansetron (ZOFRAN) injection 4 mg  4 mg Intravenous Q8H PRN Lorretta Harp, MD   4 mg at 07/05/20 1191  . oxybutynin (DITROPAN) tablet 5 mg  5 mg Oral TID Lolita Patella B, MD   5 mg at 07/04/20 2117  . oxyCODONE-acetaminophen (PERCOCET/ROXICET) 5-325 MG per tablet 1 tablet  1 tablet Oral Q4H PRN Lorretta Harp, MD   1 tablet at 07/05/20 0654  . tamsulosin (FLOMAX) capsule 0.8 mg  0.8 mg Oral Daily Georgeann Oppenheim, Sudheer B, MD   0.8 mg at 07/04/20 0828  . traZODone (DESYREL) tablet 50 mg  50 mg Oral QHS Lolita Patella B, MD   50 mg at 07/04/20 2307     Objective: Vital signs in last 24 hours: Temp:  [97.4 F (36.3 C)-97.9 F (36.6 C)] 97.4 F (36.3 C) (02/16 0728) Pulse Rate:  [53-82] 58 (02/16 0738) Resp:  [16-20] 20 (02/16 0543) BP: (78-118)/(44-87) 103/74 (02/16 0738) SpO2:  [98 %-100 %] 99 % (02/16 0728)  Intake/Output from previous day: 02/15 0701 - 02/16 0700 In: 240 [P.O.:240] Out: -  Intake/Output this shift: No intake/output data recorded.   Physical Exam Constitutional:  Well nourished. Alert and oriented, No acute distress. HEENT: [place] AT, moist mucus membranes.  Trachea midline Cardiovascular: No clubbing, cyanosis, or edema. Respiratory: Normal respiratory effort, no increased work of breathing. GU: Left CVA tenderness.  No bladder fullness or masses.   Neurologic: Grossly intact, no focal deficits, moving all 4 extremities. Psychiatric: Normal mood and affect.  Lab Results:  Recent Labs    07/03/20 0521  WBC 9.1  HGB 13.9  HCT 40.7  PLT 326   BMET Recent Labs    07/03/20 0521   NA 134*  K 4.3  CL 104  CO2 24  GLUCOSE 157*  BUN 8  CREATININE 0.78  CALCIUM 8.9   PT/INR Recent Labs    07/02/20 0800  LABPROT 13.5  INR 1.1   ABG No results for input(s): PHART, HCO3 in the last 72 hours.  Invalid input(s): PCO2, PO2  Studies/Results: No results found.   Assessment and Plan: 30 year old male who is status post left ureteral stent placement on July 02, 2020 for a 6 mm left renal pelvis stone that remained after left ESWL on June 22, 2020 after presenting to the ED with refractory pain. He was also found to have a 3 mm stone at the right proximal ureter without hydronephrosis.  Patient is scheduled for left ureteroscopy with laser lithotripsy with left ureteral stent exchange and right retrograde and potentially treat the 3 millimeter stone at the right proximal ureter if appropriate tomorrow.  I reviewed the procedure with the patient and answered his questions. I did remind him that he will be discharged after the procedure tomorrow and he stated that he was fine with going home.  Recommendations: - NPO at midnight  - continue tamsulosin 0.8 mg daily and oxybutynin IR 5 mg every 8 hours to help manage stent discomfort - continue to attempt to wean off IV pain medication and switch to oral pain medications to prepare for discharge tomorrow after procedure  - patient desires to have the stent removed by urology staff versus removing the stent himself    LOS: 3 days    [facility] 07/05/2020

## 2020-07-05 NOTE — Progress Notes (Signed)
Jacob Nixon PROGRESS NOTE    Jacob Nixon.  RPR:[id number] DOB: [birth year] DOA: 07/02/2020 PCP: Jacob Nixon, No Pcp Per      Brief Narrative:  Jacob Nixon is a 30 y.o. M with a recurrent cellulitis (?  [place]), nephrolithiasis with recent lithotripsy of left UPJ stone on 2/3 who presented with vomiting.  He had leukocytosis, tachycardia, started on IV fluids and antibiotics and admitted to the hospitalist service.        Assessment & Plan:  Sepsis due to acute pyelonephritis secondary to obstructive uropathy Met sepsis criteria on admission with leukocytosis, tachycardia, and pyelonephritis.  Now status post cystoscopy with left ureteral stent placement on 2/13.  Culture negative, antibiotics stopped.  6 doses of Dilaudid in the last 24 hours, every 3 hours.  Also getting occasional Percocet.  Plan is for cystoscopy tomorrow.  No new fever.  He has irritative symptoms with voiding, which I would expect given his stents..  -Start scheduled acetaminophen -Start Toradol -Transition to PCA pump for optimal pain control  -Continue high-dose Flomax, oxybutynin    STD exposure -Continue doxycycline         Disposition: Status is: Inpatient  Remains inpatient appropriate because:Ongoing active pain requiring inpatient pain management   Dispo: The Jacob Nixon is from: Home              Anticipated d/c is to: Home              Anticipated d/c date is: 1 day              Jacob Nixon currently is not medically stable to d/c.   Difficult to place Jacob Nixon No       Level of care: Med-Surg       MDM: The below labs and imaging reports were reviewed and summarized above.  Medication management as above.  Frequent dosing of IV opiates.     DVT prophylaxis: SCDs Start: 07/02/20 2023  Code Status: Full code Family Communication:     Consultants:   Urology  Procedures:   2/17 cystoscopy plan           Subjective: Jacob Nixon is  having left-sided flank pain, spasmodic.  No fever.  He has pain radiating from the flank with urination.  Objective: Vitals:   07/04/20 1957 07/05/20 0543 07/05/20 0728 07/05/20 0738  BP: 116/87 111/71 (!) 78/44 103/74  Pulse: 75 66 (!) 53 (!) 58  Resp: 20 20    Temp: 97.7 F (36.5 C) 97.6 F (36.4 C) (!) 97.4 F (36.3 C)   TempSrc: Oral Oral Oral   SpO2: 100% 98% 99%   Weight:      Height:        Intake/Output Summary (Last 24 hours) at 07/05/2020 1549 Last data filed at 07/05/2020 0930 Gross per 24 hour  Intake 480 ml  Output --  Net 480 ml   Filed Weights   07/01/20 2356  Weight: 114.8 kg    Examination: General appearance:  adult male, alert and in occasional pain in the left flank.   HEENT: Anicteric, conjunctiva pink, lids and lashes normal. No nasal deformity, discharge, epistaxis.  Lips moist.   Skin: Warm and dry.  no jaundice.  No suspicious rashes or lesions. Cardiac: RRR, nl S1-S2, no murmurs appreciated.  Capillary refill is brisk.  JVP not visible.  No LE edema.  Radial pulses 2+ and symmetric. Respiratory: Normal respiratory rate and rhythm.  CTAB without rales or wheezes. Abdomen:  MSK: No deformities or effusions.  Normal muscle bulk and tone Neuro: Awake and alert.  EOMI, moves all extremities. Speech fluent.    Psych: Sensorium intact and responding to questions, attention normal. Affect normal.  Judgment and insight appear normal.    Data Reviewed: I have personally reviewed following labs and imaging studies:  CBC: Recent Labs  Lab 07/02/20 0001 07/03/20 0521  WBC 13.3* 9.1  HGB 13.8 13.9  HCT 41.2 40.7  MCV 85.1 85.1  PLT 309 027   Basic Metabolic Panel: Recent Labs  Lab 07/02/20 0001 07/03/20 0521  NA 136 134*  K 3.8 4.3  CL 103 104  CO2 24 24  GLUCOSE 111* 157*  BUN 12 8  CREATININE 0.99 0.78  CALCIUM 9.0 8.9   GFR: Estimated Creatinine Clearance: 174 mL/min (by C-G formula based on SCr of 0.78 mg/dL). Liver Function  Tests: No results for input(s): AST, ALT, ALKPHOS, BILITOT, PROT, ALBUMIN in the last 168 hours. No results for input(s): LIPASE, AMYLASE in the last 168 hours. No results for input(s): AMMONIA in the last 168 hours. Coagulation Profile: Recent Labs  Lab 07/02/20 0800  INR 1.1   Cardiac Enzymes: No results for input(s): CKTOTAL, CKMB, CKMBINDEX, TROPONINI in the last 168 hours. BNP (last 3 results) No results for input(s): PROBNP in the last 8760 hours. HbA1C: No results for input(s): HGBA1C in the last 72 hours. CBG: Recent Labs  Lab 07/04/20 0843  GLUCAP 110*   Lipid Profile: No results for input(s): CHOL, HDL, LDLCALC, TRIG, CHOLHDL, LDLDIRECT in the last 72 hours. Thyroid Function Tests: No results for input(s): TSH, T4TOTAL, FREET4, T3FREE, THYROIDAB in the last 72 hours. Anemia Panel: No results for input(s): VITAMINB12, FOLATE, FERRITIN, TIBC, IRON, RETICCTPCT in the last 72 hours. Urine analysis:    Component Value Date/Time   COLORURINE YELLOW (A) 07/02/2020 0001   APPEARANCEUR HAZY (A) 07/02/2020 0001   APPEARANCEUR Cloudy (A) 06/15/2020 1411   LABSPEC 1.013 07/02/2020 0001   PHURINE 6.0 07/02/2020 0001   GLUCOSEU NEGATIVE 07/02/2020 0001   HGBUR LARGE (A) 07/02/2020 0001   BILIRUBINUR NEGATIVE 07/02/2020 0001   BILIRUBINUR Negative 06/15/2020 1411   KETONESUR NEGATIVE 07/02/2020 0001   PROTEINUR NEGATIVE 07/02/2020 0001   UROBILINOGEN 1.0 11/28/2014 1026   NITRITE NEGATIVE 07/02/2020 0001   LEUKOCYTESUR SMALL (A) 07/02/2020 0001   Sepsis Labs: '@LABRCNTIP' (procalcitonin:4,lacticacidven:4)  ) Recent Results (from the past 240 hour(s))  Urine Culture     Status: None   Collection Time: 07/02/20 12:01 AM   Specimen: Urine, Clean Catch  Result Value Ref Range Status   Specimen Description   Final    URINE, CLEAN CATCH Performed at [facility], [address] [place], [place] [address]    Special Requests   Final    NONE Performed at  [facility], [address] [place] [address]    Culture   Final    NO GROWTH Performed at [facility], [place] [address] [place], [place] [address]    Report Status 07/03/2020 FINAL  Final  Resp Panel by RT-PCR (Flu A&B, Covid) Nasopharyngeal Swab     Status: None   Collection Time: 07/02/20  5:41 AM   Specimen: Nasopharyngeal Swab; Nasopharyngeal(NP) swabs in vial transport medium  Result Value Ref Range Status   SARS Coronavirus 2 by RT PCR NEGATIVE NEGATIVE Final    Comment: (NOTE) SARS-CoV-2 target nucleic acids are NOT DETECTED.  The SARS-CoV-2 RNA is generally detectable in upper  respiratory specimens during the acute phase of infection. The lowest concentration of SARS-CoV-2 viral copies this assay can detect is 138 copies/mL. A negative result does not preclude SARS-Cov-2 infection and should not be used as the sole basis for treatment or other Jacob Nixon management decisions. A negative result may occur with  improper specimen collection/handling, submission of specimen other than nasopharyngeal swab, presence of viral mutation(s) within the areas targeted by this assay, and inadequate number of viral copies(<138 copies/mL). A negative result must be combined with clinical observations, Jacob Nixon history, and epidemiological information. The expected result is Negative.  Fact Sheet for Patients:  EntrepreneurPulse.com.au  Fact Sheet for Healthcare Providers:  IncredibleEmployment.be  This test is no t yet approved or cleared by the [place] FDA and  has been authorized for detection and/or diagnosis of SARS-CoV-2 by FDA under an Emergency Use Authorization (EUA). This EUA will remain  in effect (meaning this test can be used) for the duration of the COVID-19 declaration under Section 564(b)(1) of the Act, 21 U.S.C.section 360bbb-3(b)(1), unless the authorization is terminated  or revoked sooner.        Influenza A by PCR NEGATIVE NEGATIVE Final   Influenza B by PCR NEGATIVE NEGATIVE Final    Comment: (NOTE) The Xpert Xpress SARS-CoV-2/FLU/RSV plus assay is intended as an aid in the diagnosis of influenza from Nasopharyngeal swab specimens and should not be used as a sole basis for treatment. Nasal washings and aspirates are unacceptable for Xpert Xpress SARS-CoV-2/FLU/RSV testing.  Fact Sheet for Patients: EntrepreneurPulse.com.au  Fact Sheet for Healthcare Providers: IncredibleEmployment.be  This test is not yet approved or cleared by the [place] FDA and has been authorized for detection and/or diagnosis of SARS-CoV-2 by FDA under an Emergency Use Authorization (EUA). This EUA will remain in effect (meaning this test can be used) for the duration of the COVID-19 declaration under Section 564(b)(1) of the Act, 21 U.S.C. section 360bbb-3(b)(1), unless the authorization is terminated or revoked.  Performed at [facility], [address] [place], [place] [address]   Culture, blood (x 2)     Status: None (Preliminary result)   Collection Time: 07/02/20  8:00 AM   Specimen: BLOOD  Result Value Ref Range Status   Specimen Description BLOOD RIGHT ANTECUBITAL  Final   Special Requests   Final    BOTTLES DRAWN AEROBIC AND ANAEROBIC Blood Culture adequate volume   Culture   Final    NO GROWTH 3 DAYS Performed at [facility], [address], [place], [place] [address]    Report Status PENDING  Incomplete  Culture, blood (x 2)     Status: None (Preliminary result)   Collection Time: 07/02/20  8:00 AM   Specimen: BLOOD  Result Value Ref Range Status   Specimen Description BLOOD LEFT ANTECUBITAL  Final   Special Requests   Final    BOTTLES DRAWN AEROBIC AND ANAEROBIC Blood Culture adequate volume   Culture   Final    NO GROWTH 3 DAYS Performed at [facility], [address] [place], [place] [address]     Report Status PENDING  Incomplete  Urine Culture     Status: None   Collection Time: 07/02/20  4:26 PM   Specimen: Kidney; Urine  Result Value Ref Range Status   Specimen Description   Final    KIDNEY left kidney Performed at [facility], [address], [place], [place] [address]    Special Requests   Final  clindamycin, rocephin Performed at [facility], [address] [place], [place] [address]    Culture   Final    NO GROWTH Performed at [facility], [place] [address] [place], [place] [address]    Report Status 07/03/2020 FINAL  Final         Radiology Studies: No results found.      Scheduled Meds: . acetaminophen  500 mg Oral TID  . doxycycline  100 mg Oral Q12H  . HYDROmorphone   Intravenous Q4H  . ketorolac  15 mg Intravenous Q8H  . melatonin  3 mg Oral QHS  . oxybutynin  5 mg Oral TID  . tamsulosin  0.8 mg Oral Daily  . traZODone  50 mg Oral QHS   Continuous Infusions: . sodium chloride Stopped (07/03/20 0619)     LOS: 3 days    Time spent: 35-minute    Edwin Dada, MD [facility] 07/05/2020, 3:49 PM     Please page though Gregory or Epic secure chat:  For [employer], Adult nurse

## 2020-07-06 ENCOUNTER — Encounter
Admission: EM | Disposition: A | Discharge status: Home / Self Care | Payer: Self-pay | Source: Home / Self Care | Attending: Family Medicine

## 2020-07-06 ENCOUNTER — Encounter: Payer: Self-pay | Admitting: Internal Medicine

## 2020-07-06 ENCOUNTER — Inpatient Hospital Stay: Payer: BC Managed Care – PPO | Admitting: Registered Nurse

## 2020-07-06 ENCOUNTER — Inpatient Hospital Stay: Payer: BC Managed Care – PPO

## 2020-07-06 DIAGNOSIS — N201 Calculus of ureter: Secondary | ICD-10-CM

## 2020-07-06 HISTORY — PX: CYSTOSCOPY/URETEROSCOPY/HOLMIUM LASER/STENT PLACEMENT: SHX6546

## 2020-07-06 LAB — GLUCOSE, CAPILLARY
Glucose-Capillary: 94 mg/dL (ref 70–99)
Glucose-Capillary: 91 mg/dL (ref 70–99)

## 2020-07-06 LAB — COMPREHENSIVE METABOLIC PANEL
ALT: 22 U/L (ref 0–44)
AST: 19 U/L (ref 15–41)
Albumin: 3 g/dL — ABNORMAL LOW (ref 3.5–5.0)
Alkaline Phosphatase: 71 U/L (ref 38–126)
Anion gap: 7 (ref 5–15)
BUN: 10 mg/dL (ref 6–20)
CO2: 29 mmol/L (ref 22–32)
Calcium: 8.9 mg/dL (ref 8.9–10.3)
Chloride: 103 mmol/L (ref 98–111)
Creatinine, Ser: 0.95 mg/dL (ref 0.61–1.24)
GFR, Estimated: >60 mL/min (ref >60)
Glucose, Bld: 116 mg/dL — ABNORMAL HIGH (ref 70–99)
Potassium: 4 mmol/L (ref 3.5–5.1)
Sodium: 139 mmol/L (ref 135–145)
Total Bilirubin: 0.4 mg/dL (ref 0.3–1.2)
Total Protein: 6.5 g/dL (ref 6.5–8.1)

## 2020-07-06 LAB — CBC
HCT: 40.1 % (ref 39.0–52.0)
Hemoglobin: 13.3 g/dL (ref 13.0–17.0)
MCH: 28.8 pg (ref 26.0–34.0)
MCHC: 33.2 g/dL (ref 30.0–36.0)
MCV: 86.8 fL (ref 80.0–100.0)
Platelets: 301 10*3/uL (ref 150–400)
RBC: 4.62 MIL/uL (ref 4.22–5.81)
RDW: 14 % (ref 11.5–15.5)
WBC: 12.7 10*3/uL — ABNORMAL HIGH (ref 4.0–10.5)
nRBC: 0 % (ref 0.0–0.2)

## 2020-07-06 SURGERY — CYSTOSCOPY/URETEROSCOPY/HOLMIUM LASER/STENT PLACEMENT
Anesthesia: General | Laterality: Bilateral

## 2020-07-06 MED ORDER — FENTANYL CITRATE (PF) 100 MCG/2ML IJ SOLN: INTRAMUSCULAR | Status: DC | PRN

## 2020-07-06 MED ORDER — NAPROXEN SODIUM 550 MG PO TABS
550.0000 mg | ORAL_TABLET | Freq: Once | ORAL | Status: DC
  Filled 2020-07-06: qty 1

## 2020-07-06 MED ORDER — KETOROLAC TROMETHAMINE 30 MG/ML IJ SOLN: 30.0000 mg | Freq: Once | INTRAMUSCULAR | Status: DC | PRN

## 2020-07-06 MED ORDER — ACETAMINOPHEN 160 MG/5ML PO SOLN
325.0000 mg | ORAL | Status: DC | PRN
  Filled 2020-07-06: qty 20.3

## 2020-07-06 MED ORDER — LIDOCAINE HCL (PF) 2 % IJ SOLN
INTRAMUSCULAR | Status: AC
  Filled 2020-07-06: qty 5

## 2020-07-06 MED ORDER — DROPERIDOL 2.5 MG/ML IJ SOLN
0.6250 mg | Freq: Once | INTRAMUSCULAR | Status: DC | PRN
  Filled 2020-07-06: qty 2

## 2020-07-06 MED ORDER — IOPAMIDOL (ISOVUE-200) INJECTION 41%
INTRAVENOUS | Status: DC | PRN
  Administered 2020-07-06: 40 mL via INTRAVENOUS

## 2020-07-06 MED ORDER — MORPHINE SULFATE (PF) 4 MG/ML IV SOLN
4.0000 mg | Freq: Once | INTRAVENOUS | Status: AC
  Administered 2020-07-06: 4 mg via INTRAVENOUS

## 2020-07-06 MED ORDER — MORPHINE SULFATE (PF) 4 MG/ML IV SOLN: 4.0000 mg | INTRAVENOUS | Status: DC | PRN

## 2020-07-06 MED ORDER — EPHEDRINE SULFATE 50 MG/ML IJ SOLN
INTRAMUSCULAR | Status: DC | PRN
  Administered 2020-07-06: 5 mg via INTRAVENOUS

## 2020-07-06 MED ORDER — BELLADONNA ALKALOIDS-OPIUM 16.2-60 MG RE SUPP
RECTAL | Status: DC | PRN
  Administered 2020-07-06: 1 via RECTAL

## 2020-07-06 MED ORDER — PROMETHAZINE HCL 25 MG/ML IJ SOLN: 6.2500 mg | INTRAMUSCULAR | Status: DC | PRN

## 2020-07-06 MED ORDER — OXYCODONE HCL 5 MG PO TABS: 5.0000 mg | ORAL_TABLET | ORAL | Status: DC | PRN

## 2020-07-06 MED ORDER — OXYBUTYNIN CHLORIDE 5 MG PO TABS
5.0000 mg | ORAL_TABLET | Freq: Once | ORAL | Status: AC
  Administered 2020-07-06: 5 mg via ORAL
  Filled 2020-07-06: qty 1

## 2020-07-06 MED ORDER — HYDROCODONE-ACETAMINOPHEN 7.5-325 MG PO TABS: 1.0000 | ORAL_TABLET | Freq: Once | ORAL | Status: DC | PRN

## 2020-07-06 MED ORDER — NAPROXEN 500 MG PO TBEC: 500.0000 mg | DELAYED_RELEASE_TABLET | Freq: Two times a day (BID) | ORAL | 0 refills | Status: DC | PRN

## 2020-07-06 MED ORDER — DOXYCYCLINE HYCLATE 100 MG PO TABS: 100.0000 mg | ORAL_TABLET | Freq: Two times a day (BID) | ORAL | 0 refills | Status: DC

## 2020-07-06 MED ORDER — DEXMEDETOMIDINE (PRECEDEX) IN NS 20 MCG/5ML (4 MCG/ML) IV SYRINGE
PREFILLED_SYRINGE | INTRAVENOUS | Status: AC
  Filled 2020-07-06: qty 5

## 2020-07-06 MED ORDER — ACETAMINOPHEN 325 MG PO TABS: 325.0000 mg | ORAL_TABLET | ORAL | Status: DC | PRN

## 2020-07-06 MED ORDER — FENTANYL CITRATE (PF) 100 MCG/2ML IJ SOLN: 25.0000 ug | INTRAMUSCULAR | Status: DC | PRN

## 2020-07-06 MED ORDER — LIDOCAINE HCL (CARDIAC) PF 100 MG/5ML IV SOSY
PREFILLED_SYRINGE | INTRAVENOUS | Status: DC | PRN
  Administered 2020-07-06: 100 mg via INTRAVENOUS

## 2020-07-06 MED ORDER — BELLADONNA ALKALOIDS-OPIUM 16.2-60 MG RE SUPP
RECTAL | Status: AC
  Filled 2020-07-06: qty 1

## 2020-07-06 MED ORDER — HYDROMORPHONE HCL 1 MG/ML IJ SOLN
1.0000 mg | INTRAMUSCULAR | Status: DC | PRN
  Administered 2020-07-06 – 2020-07-07 (×5): 1 mg via INTRAVENOUS
  Filled 2020-07-06 (×6): qty 1

## 2020-07-06 MED ORDER — OXYCODONE HCL 5 MG PO TABS
5.0000 mg | ORAL_TABLET | ORAL | Status: AC
  Administered 2020-07-06: 5 mg via ORAL
  Filled 2020-07-06: qty 1

## 2020-07-06 MED ORDER — MIDAZOLAM HCL 2 MG/2ML IJ SOLN
INTRAMUSCULAR | Status: DC | PRN
  Administered 2020-07-06: 2 mg via INTRAVENOUS

## 2020-07-06 MED ORDER — OXYCODONE HCL 5 MG PO TABS
5.0000 mg | ORAL_TABLET | Freq: Once | ORAL | Status: AC
  Administered 2020-07-06: 5 mg via ORAL
  Filled 2020-07-06: qty 1

## 2020-07-06 MED ORDER — PROPOFOL 10 MG/ML IV BOLUS
INTRAVENOUS | Status: AC
  Filled 2020-07-06: qty 20

## 2020-07-06 MED ORDER — LACTATED RINGERS IV SOLN: INTRAVENOUS | Status: DC

## 2020-07-06 MED ORDER — KETOROLAC TROMETHAMINE 30 MG/ML IJ SOLN
INTRAMUSCULAR | Status: AC
  Filled 2020-07-06: qty 1

## 2020-07-06 MED ORDER — DEXMEDETOMIDINE (PRECEDEX) IN NS 20 MCG/5ML (4 MCG/ML) IV SYRINGE
PREFILLED_SYRINGE | INTRAVENOUS | Status: DC | PRN
  Administered 2020-07-06: 4 ug via INTRAVENOUS

## 2020-07-06 MED ORDER — MORPHINE SULFATE (PF) 4 MG/ML IV SOLN
INTRAVENOUS | Status: AC
  Filled 2020-07-06: qty 1

## 2020-07-06 MED ORDER — IOHEXOL 300 MG/ML  SOLN
100.0000 mL | Freq: Once | INTRAMUSCULAR | Status: AC | PRN
  Administered 2020-07-06: 100 mL via INTRAVENOUS

## 2020-07-06 MED ORDER — FENTANYL CITRATE (PF) 100 MCG/2ML IJ SOLN
INTRAMUSCULAR | Status: AC
  Filled 2020-07-06: qty 2

## 2020-07-06 MED ORDER — NAPROXEN SODIUM 550 MG PO TABS: 550.0000 mg | ORAL_TABLET | Freq: Once | ORAL | Status: DC

## 2020-07-06 MED ORDER — FENTANYL CITRATE (PF) 100 MCG/2ML IJ SOLN
INTRAMUSCULAR | Status: DC | PRN
  Administered 2020-07-06: 25 ug via INTRAVENOUS

## 2020-07-06 MED ORDER — PROPOFOL 10 MG/ML IV BOLUS
INTRAVENOUS | Status: DC | PRN
  Administered 2020-07-06: 150 mg via INTRAVENOUS

## 2020-07-06 MED ORDER — IBUPROFEN 400 MG PO TABS
800.0000 mg | ORAL_TABLET | Freq: Once | ORAL | Status: AC
  Administered 2020-07-06: 800 mg via ORAL
  Filled 2020-07-06: qty 2

## 2020-07-06 MED ORDER — KETOROLAC TROMETHAMINE 30 MG/ML IJ SOLN
30.0000 mg | Freq: Three times a day (TID) | INTRAMUSCULAR | Status: DC
  Administered 2020-07-06 – 2020-07-07 (×2): 30 mg via INTRAVENOUS
  Filled 2020-07-06 (×3): qty 1

## 2020-07-06 MED ORDER — MIDAZOLAM HCL 2 MG/2ML IJ SOLN
INTRAMUSCULAR | Status: AC
  Filled 2020-07-06: qty 2

## 2020-07-06 MED ORDER — KETOROLAC TROMETHAMINE 30 MG/ML IJ SOLN
INTRAMUSCULAR | Status: DC | PRN
  Administered 2020-07-06: 30 mg via INTRAVENOUS

## 2020-07-06 SURGICAL SUPPLY — 33 items
ADH LQ OCL WTPRF AMP STRL LF (MISCELLANEOUS) ×1
ADHESIVE MASTISOL STRL (MISCELLANEOUS) ×1 IMPLANT
BAG DRAIN CYSTO-URO LG1000N (MISCELLANEOUS) ×2 IMPLANT
BASKET ZERO TIP 1.9FR (BASKET) ×1 IMPLANT
BRUSH SCRUB EZ 1% IODOPHOR (MISCELLANEOUS) ×2 IMPLANT
BSKT STON RTRVL ZERO TP 1.9FR (BASKET) ×1
CATH URETL 5X70 OPEN END (CATHETERS) ×2 IMPLANT
CNTNR SPEC 2.5X3XGRAD LEK (MISCELLANEOUS)
CONT SPEC 4OZ STER OR WHT (MISCELLANEOUS)
CONT SPEC 4OZ STRL OR WHT (MISCELLANEOUS)
CONTAINER SPEC 2.5X3XGRAD LEK (MISCELLANEOUS) IMPLANT
DRAPE UTILITY 15X26 TOWEL STRL (DRAPES) ×2 IMPLANT
DRSG TEGADERM 2-3/8X2-3/4 SM (GAUZE/BANDAGES/DRESSINGS) ×1 IMPLANT
GLOVE SURG ENC MOIS LTX SZ6.5 (GLOVE) ×2 IMPLANT
GOWN STRL REUS W/ TWL LRG LVL3 (GOWN DISPOSABLE) ×2 IMPLANT
GOWN STRL REUS W/TWL LRG LVL3 (GOWN DISPOSABLE) ×4
GUIDEWIRE GREEN .038 145CM (MISCELLANEOUS) ×1 IMPLANT
GUIDEWIRE STR DUAL SENSOR (WIRE) ×3 IMPLANT
INFUSOR MANOMETER BAG 3000ML (MISCELLANEOUS) ×2 IMPLANT
INTRODUCER DILATOR DOUBLE (INTRODUCER) IMPLANT
KIT TURNOVER CYSTO (KITS) ×2 IMPLANT
MANIFOLD NEPTUNE II (INSTRUMENTS) ×1 IMPLANT
PACK CYSTO AR (MISCELLANEOUS) ×2 IMPLANT
SET CYSTO W/LG BORE CLAMP LF (SET/KITS/TRAYS/PACK) ×2 IMPLANT
SHEATH URETERAL 12FRX35CM (MISCELLANEOUS) ×1 IMPLANT
SOL .9 NS 3000ML IRR  AL (IV SOLUTION) ×2
SOL .9 NS 3000ML IRR AL (IV SOLUTION) ×1
SOL .9 NS 3000ML IRR UROMATIC (IV SOLUTION) ×1 IMPLANT
STENT URET 6FRX24 CONTOUR (STENTS) IMPLANT
STENT URET 6FRX26 CONTOUR (STENTS) ×2 IMPLANT
SURGILUBE 2OZ TUBE FLIPTOP (MISCELLANEOUS) ×2 IMPLANT
TRACTIP FLEXIVA PULSE ID 200 (Laser) ×2 IMPLANT
WATER STERILE IRR 1000ML POUR (IV SOLUTION) ×2 IMPLANT

## 2020-07-06 NOTE — Anesthesia Preprocedure Evaluation (Addendum)
Anesthesia Evaluation  Patient identified by MRN, date of birth, ID band Patient awake    Reviewed: Allergy & Precautions, H&P , NPO status , reviewed documented beta blocker date and time   Airway Mallampati: II  TM Distance: >3 FB Neck ROM: full    Dental  (+) Teeth Intact, Chipped   Pulmonary former smoker,    Pulmonary exam normal        Cardiovascular Normal cardiovascular exam     Neuro/Psych  Neuromuscular disease    GI/Hepatic neg GERD  ,  Endo/Other    Renal/GU Renal disease     Musculoskeletal  (+) Arthritis ,   Abdominal   Peds  Hematology   Anesthesia Other Findings Past Medical History: No date: Bronchitis No date: Cellulitis of right hand - RECURRENT No date: Kidney stones Past Surgical History: 04/09/2016: CARPAL TUNNEL RELEASE; Left     Comment:  Procedure: CARPAL TUNNEL RELEASE;  Surgeon: Cammy Copa, MD;  Location: [facility] OR;  Service:               Orthopedics;  Laterality: Left; 07/02/2020: CYSTOSCOPY WITH RETROGRADE PYELOGRAM, URETEROSCOPY AND  STENT PLACEMENT; Bilateral     Comment:  Procedure: CYSTOSCOPY WITH RETROGRADE PYELOGRAM,               URETEROSCOPY AND STENT PLACEMENT POSSIBLY BILATERAL;                Surgeon: Jannifer Hick, MD;  Location: [facility] ORS;                Service: Urology;  Laterality: Bilateral; 06/22/2020: EXTRACORPOREAL SHOCK WAVE LITHOTRIPSY; Left     Comment:  Procedure: EXTRACORPOREAL SHOCK WAVE LITHOTRIPSY (ESWL);              Surgeon: Vanna Scotland, MD;  Location: [facility] OR[facility];                Service: Urology;  Laterality: Left; No date: HAND SURGERY No date: KIDNEY STONE SURGERY     Comment:  lithortripsy BMI    Body Mass Index: 35.29 kg/m     Reproductive/Obstetrics                            Anesthesia Physical Anesthesia Plan  ASA: II  Anesthesia Plan: General   Post-op Pain Management:    Induction:  Intravenous  PONV Risk Score and Plan: Ondansetron and Treatment may vary due to age or medical condition  Airway Management Planned: LMA  Additional Equipment:   Intra-op Plan:   Post-operative Plan: Extubation in OR  Informed Consent: I have reviewed the patients History and Physical, chart, labs and discussed the procedure including the risks, benefits and alternatives for the proposed anesthesia with the patient or authorized representative who has indicated his/her understanding and acceptance.     Dental Advisory Given  Plan Discussed with: CRNA  Anesthesia Plan Comments: (LMA used few days ago, no issues)        Anesthesia Quick Evaluation

## 2020-07-06 NOTE — Transfer of Care (Signed)
Immediate Anesthesia Transfer of Care Note  Patient: Jacob Nixon.  Procedure(s) Performed: CYSTOSCOPY/URETEROSCOPY/HOLMIUM LASER/LEFT STENT EXCHANGE, RIGHT STENT PLACEMENT (Bilateral )  Patient Location: PACU  Anesthesia Type:General  Level of Consciousness: awake, alert  and oriented  Airway & Oxygen Therapy: Patient Spontanous Breathing and Patient connected to face mask oxygen  Post-op Assessment: Report given to RN and Post -op Vital signs reviewed and stable  Post vital signs: Reviewed and stable  Last Vitals:  Vitals Value Taken Time  BP 108/59 07/06/20 1420  Temp 36.8 C 07/06/20 1420  Pulse 98 07/06/20 1420  Resp 7 07/06/20 1420  SpO2 99 % 07/06/20 1420    Last Pain:  Vitals:   07/06/20 1420  TempSrc:   PainSc: 0-No pain      Patients Stated Pain Goal: 2 (07/05/20 2200)  Complications: No complications documented.

## 2020-07-06 NOTE — Op Note (Signed)
Date of procedure: 07/06/20  Preoperative diagnosis:  1. Left proximal ureteral calculus 2. Right proximal ureteral calculus  Postoperative diagnosis:  1. Bilateral nonobstructing renal calculi   Procedure: 1. Cystoscopy 2. Urethral dilation 3. Left ureteroscopy laser lithotripsy and basket extraction 4. Left retrograde pyelogram 5. Left ureteral stent exchange 6. Right ureteroscopy with laser lithotripsy 7. Right ureteral stent exchange 8. Right retrograde pyelogram 9. Interpretation of fluoroscopy less than 30 minutes  Surgeon: Vanna Scotland, MD  Anesthesia: General  Complications: None  Intraoperative findings: Previously located left proximal ureteral stone, 6 mm found within a lower pole calyx, completely obliterated treated and all fragments removed via basket.  Stent replaced.  Diagnostic ureteroscopy on the right given presence of right flank discomfort and small nonobstructing right proximal fragment.  A few small stone was encountered in the renal pelvis as well as few nonobstructing punctate Randall's plaques treated within the right kidney.  Stent also placed into the on the side.  EBL: Minimal  Specimens: 6 x 26 French double-J ureteral stent bilaterally on tethers  Drains: Stone fragment  Indication: Jacob Nixon. is a 30 y.o. patient with left proximal ureteral calculus requiring stent placement for pain who returns today for staged procedure.  He also has an incidental right proximal ureteral nonobstructing fragment with some right-sided pressure.  After reviewing the management options for treatment, he elected to proceed with the above surgical procedure(s). We have discussed the potential benefits and risks of the procedure, side effects of the proposed treatment, the likelihood of the patient achieving the goals of the procedure, and any potential problems that might occur during the procedure or recuperation. Informed consent has been  obtained.  Description of procedure:  The patient was taken to the operating room and general anesthesia was induced.  The patient was placed in the dorsal lithotomy position, prepped and draped in the usual sterile fashion, and preoperative antibiotics were administered. A preoperative time-out was performed.   A 21 French scope was advanced per urethra into the bladder.  Attention was turned to the left ureter orifice which a ureteral stent was seen emanating.  The distal coil of the stent was grasped and brought to level the urethral meatus.  This was then cannulated using a sensor wire up to the level of the kidney.  The stent was removed and the wire snapped in place as a safety wire.  I did have attempted to advance a semirigid ureteroscope through the distal ureter was not able to make much progress here due to his anatomy.  I injected Conray which showed no obvious hydroureteronephrosis or filling defects.  As such, a Super Stiff wire was placed through the scope up to the level of the kidney as a working wire.  I then used a Adriana Simas 12/14 [place] access sheath along this wire under fluoroscopic guidance which was advanced easily to the proximal ureter.  The inner cannula and wire were then removed.  The scope was advanced to the renal pelvis and each every calyx was directly visualized.  I encountered the 6 mm stone in a lower pole.  Presumably been pushed here at the time of stent placement or perhaps with wire manipulation earlier in the procedure.  I brought in a 242 m laser fiber and using the settings of 0.2 J and 40 Hz, I fragmented the stone into 6 or 7 smaller pieces.  I then used a nitinol basket to remove each and every fragment such that the entire collecting  system was clear.  Final retrograde pyelogram showed no contrast extravasation and created roadmap to ensure that each calyx was visualized.  Then slowly backed the access sheath out along with the scope inspecting along the way.  There  were no ureteral injuries and only a mild amount of edema from the previous stent.  I left this wire in place until the end of the procedure at which time a 6 x 26 French stent was placed fluoroscopically with a coil of both within the renal pelvis as well as within the bladder left on a tether.  Attention was then turned to the right ureteral orifice.  I did use male sounds again to dilate the urethral meatus as I did at the beginning of the case to accommodate the scope.  Attention was turned to the right ureteral orifice.  Using a wire and open-ended ureteral catheter, cannulated the UO and injected contrast.  The ureter itself appeared very delicate and the collecting system was completely decompressed.  Given his right flank pressures on his intolerance of stenting stones in terms of pain control, I like to go ahead and perform diagnostic ureteroscopy on the side as a precaution.  A Super Stiff wire was advanced to the level of the kidney.  I then advanced the scope which went quite easily up to the renal pelvis.  A very small approximately 2 to 3 mm fragment was identified within the renal pelvis.  I obliterated this using the same laser and laser settings.  Each every calyx was directly visualized and no additional fragments were identified.  A final retrograde pyelogram again showed no contrast and no additional filling defects or residual stone burden.  The scope was then backed down the length of the ureter.  I ended up placing another 6 x 26 French double-J ureteral stent on the side as well on a tether using the same aforementioned technique in the setting of bilateral ureteral manipulation.  The bladder was then drained.  This then strings were affixed to the patient's groin using Mastisol and Tegaderm.  The patient was then cleaned and dried, repositioned in supine position reversed myesthesia and taken to the PACU in stable condition.  Notably, given his history of severe stent discomfort and  intolerance, I did place a BNO suppository prior to him awakening and we try to optimize pain medication.  Plan: Postoperative plan was communicated with the hospitalist.  He may be discharged once his pain is adequately controlled.  We will have him remove his own stent on Monday.  We will have him follow-up in 4 weeks with renal ultrasound prior.  This plan was also discussed with his mother with whom he is currently living.  Vanna Scotland, M.D.

## 2020-07-06 NOTE — Interval H&P Note (Signed)
History and Physical Interval Note:  07/06/2020 12:35 PM  [name].  has presented today for surgery, with the diagnosis of Left UPJ stone,Right proximal Ureteral stone.  The various methods of treatment have been discussed with the patient and family. After consideration of risks, benefits and other options for treatment, the patient has consented to  Procedure(s): CYSTOSCOPY/URETEROSCOPY/HOLMIUM LASER/STENT EXCHANGE (Bilateral) as a surgical intervention.  The patient's history has been reviewed, patient examined, no change in status, stable for surgery.  I have reviewed the patient's chart and labs.  Questions were answered to the patient's satisfaction.    RRR CTAB  Jacob Nixon

## 2020-07-06 NOTE — Progress Notes (Addendum)
Right stent removed per MD verbal order. Left stent came out in the process. This caused the patient no pain. Pt's pain is improving with scheduled meds + PRN dilaudid and Toradol. Pt urine still bloody red. Will continue to monitor.

## 2020-07-06 NOTE — Progress Notes (Signed)
   07/06/20 1130  Clinical Encounter Type  Visited With Other (Comment) (See note)   I along with another chaplain made several attempts to visit Ms. Arceneaux. He was not available.   Chaplain Trudie Buckler, [place]

## 2020-07-06 NOTE — Discharge Summary (Signed)
Physician Discharge Summary  Jacob Nixon. ZPH:[id number] DOB: 1991-02-12 DOA: 07/02/2020  PCP: Patient, No Pcp Per  Admit date: 07/02/2020 Discharge date: 07/07/2020  Admitted From: Home  Disposition:  Home   Recommendations for Outpatient Follow-up:  1. Follow up with Urology Dr. Erlene Quan as directed in 4 weeks 2. Repeat chlaymdia and GC testing in 3 months to monitor for reinfection      Home Health: None  Equipment/Devices: None  Discharge Condition: Good  CODE STATUS: FULL Diet recommendation: Regular  Brief/Interim Summary: Jacob Nixon is a 31 y.o. M with a recurrent cellulitis vs rheumatologic disease (these episodes resolve with antibiotics), nephrolithiasis with recent lithotripsy of left UPJ stone on 2/3 who presented with vomiting.  He had leukocytosis, tachycardia, started on IV fluids and antibiotics and admitted to the hospitalist service.          PRINCIPAL HOSPITAL DIAGNOSIS: Nephrolithiasis    Discharge Diagnoses:   SIRS due to nephrolithiasis complicated by unremitting kidney stone pain  Met SIRS criteria on admission with leukocytosis, tachycardia, but sepsis ruled out, cultures negative, antibiotics stopped.    Underwent cystoscopy with left ureteral stent placement on 2/13 but had continued pain due to residual stones and could not be discharged.    Placed on Dilaudid PCA, taken back to OR on 2/17 today for Pete cystoscopy, laser lithotripsy and basket extraction on the left, as well as laser lithotripsy and stone extraction on the right, with right ureteral stent exchange.  ADDENDUM:  Post-op, patient in continued severe pain, out of proportion to expected.  This was discussed with Dr. Erlene Quan. I evaluated the patient at the bedside, who was rating his pain at "a thousand" out of 10, "worst pain ever", writhing in bed, and screaming in pain.  His vital signs were WNL (afebrile, BP 108/68, pulse 77, RR 16, SpO2 98% on room air).  Given his pain  out of proportion to expected, imaging was obtained, results below.  These results were discussed with On call Urology who recommended removing dislodged right ureteral stent, with awareness that left stent might dislodge in the process.   Both stents were removed, and patient's pain improved.  This morning, he has some mild to moderate bladder spasm, but no flank pain.        STD exposure Patient had a chlamydia exposure.  He was tested at an outside facility.   Here he was treated empirically and discharged to complete 7 days doxycycline.  He should be retested in 3 months to monitor for reinfection.    Abnormal CT lung findings CT chest showed ground glass opacities.  This was discussed with patient.  Patient had absolutely no respiratory symptoms on query, no dyspnea, no hypoxia.  I presume this was from prior COVID infection.  In the absence of respiratory symptoms, I do not think further follow up of this is necessary.        Discharge Instructions  Discharge Instructions    Discharge instructions   Complete by: As directed    Take naproxen 500 mg up to twice daily IF you need this longer than 1 week, talk to your primary doctor  You may also take oxycodone 5 mg or 10 mg  Take 5 mg (half tab) first, before you take 10, and avoid use with other sedating medicines   GO see your primary care in 1 week   Increase activity slowly   Complete by: As directed    No wound care   Complete  by: As directed    No wound care   Complete by: As directed      Allergies as of 07/07/2020      Reactions   Coconut Flavor    Scratchy throat   Penicillins    DID THE REACTION INVOLVE: Swelling of the face/tongue/throat, SOB, or low BP? Y Sudden or severe rash/hives, skin peeling, or the inside of the mouth or nose? N  Did it require medical treatment? Y When did it last happen?January 2020 If all above answers are "NO", may proceed with cephalosporin use.      Medication  List    STOP taking these medications   HYDROcodone-acetaminophen 5-325 MG tablet Commonly known as: Norco   traMADol 50 MG tablet Commonly known as: Ultram     TAKE these medications   acetaminophen 500 MG tablet Commonly known as: TYLENOL Take 2 tablets (1,000 mg total) by mouth every 8 (eight) hours as needed for mild pain, moderate pain, fever or headache.   acetaZOLAMIDE 125 MG tablet Commonly known as: DIAMOX Take 1 tablet (125 mg total) by mouth 2 (two) times daily.   doxycycline 100 MG tablet Commonly known as: VIBRA-TABS Take 1 tablet (100 mg total) by mouth every 12 (twelve) hours.   naproxen 500 MG EC tablet Commonly known as: EC NAPROSYN Take 1 tablet (500 mg total) by mouth 2 (two) times daily as needed.   ondansetron 4 MG disintegrating tablet Commonly known as: Zofran ODT Take 1 tablet (4 mg total) by mouth every 8 (eight) hours as needed.   Oxycodone HCl 10 MG Tabs Take 1 tablet (10 mg total) by mouth every 12 (twelve) hours as needed for up to 5 days.   tamsulosin 0.4 MG Caps capsule Commonly known as: FLOMAX Take 1 capsule (0.4 mg total) by mouth daily for 7 days.   traZODone 50 MG tablet Commonly known as: DESYREL Take 0.5-1 tablets (25-50 mg total) by mouth at bedtime as needed for sleep.       Follow-up [facility]. Call in 4 weeks.   Specialty: Urology Why: For follow up with renal ultrasound prior to visit Contact information: [place], [place] [phone]       Primary Care Follow up.   Why: If pain is uncontrolled after 7 days              Allergies  Allergen Reactions  . Coconut Flavor     Scratchy throat  . Penicillins     DID THE REACTION INVOLVE: Swelling of the face/tongue/throat, SOB, or low BP? Y Sudden or severe rash/hives, skin peeling, or the inside of the mouth or nose? N  Did it require medical treatment? Y When did it last  happen?January 2020 If all above answers are "NO", may proceed with cephalosporin use.     Consultations:  Urology   Procedures/Studies: DG Abdomen 1 View  Result Date: 07/02/2020 CLINICAL DATA:  Left flank pain.  Recent lithotripsy. EXAM: ABDOMEN - 1 VIEW COMPARISON:  Radiograph 06/22/2020.  Most recent CT 06/09/2020 FINDINGS: Previous stone in the region of the left ureteropelvic junction is no longer seen. No definite stone or stone fragments along the course of the ureter or in the pelvis. No evidence of radiopaque calculi. No bowel dilatation. Occasional air-fluid levels within nondilated small bowel in the central abdomen. No free intra-abdominal air. Lung bases are clear. No acute osseous abnormalities are seen. IMPRESSION: Previous left  UPJ calculus no longer seen. No radiograph evidence of stone or stone fragments along the course of the ureters or in the pelvis. Electronically Signed   By: Keith Rake M.D.   On: 07/02/2020 00:30   DG Abd 1 View  Result Date: 06/22/2020 CLINICAL DATA:  Preoperative study.  Left UPJ stone. EXAM: ABDOMEN - 1 VIEW COMPARISON:  06/15/2020. FINDINGS: Stable 6 mm calcific density noted over the left UPJ region. This is consistent with known left UPJ stone. No significant interim change in position. Nonspecific air-filled loops of small bowel noted. Colon is nondistended. No free air identified. No acute bony abnormality. IMPRESSION: Stable 6 mm calcific density noted over the left UPJ region. This is consistent with known left UPJ stone. No significant interim change in position. Electronically Signed   By: Marcello Moores  Register   On: 06/22/2020 06:59   Abdomen 1 view (KUB)  Result Date: 06/15/2020 CLINICAL DATA:  Kidney stones EXAM: ABDOMEN - 1 VIEW COMPARISON:  10/11/2018, CT 06/09/2020 FINDINGS: Nonobstructed gas pattern. 6 mm calcification to the left of L1, in the region of left UPJ. IMPRESSION: 6 mm calcification/suspected stone to the left of L1, in  the region of left UPJ or proximal ureter. Electronically Signed   By: Donavan Foil M.D.   On: 06/15/2020 17:35   CT ABDOMEN PELVIS W CONTRAST  Result Date: 07/06/2020 CLINICAL DATA:  Acute onset right flank pain, history of renal calculi and left ureteral stent placement EXAM: CT ABDOMEN AND PELVIS WITH CONTRAST TECHNIQUE: Multidetector CT imaging of the abdomen and pelvis was performed using the standard protocol following bolus administration of intravenous contrast. CONTRAST:  164m OMNIPAQUE IOHEXOL 300 MG/ML  SOLN COMPARISON:  07/02/2020, 07/06/2020 FINDINGS: Lower chest: Patchy airspace disease within the lingula could reflect infection or aspiration. No effusion or pneumothorax. Hepatobiliary: No focal liver abnormality is seen. No gallstones, gallbladder wall thickening, or biliary dilatation. Pancreas: Unremarkable. No pancreatic ductal dilatation or surrounding inflammatory changes. Spleen: Normal in size without focal abnormality. Adrenals/Urinary Tract: There has been interval placement of a left ureteral stent, extending from the left renal pelvis to the bladder lumen. The left-sided obstructive uropathy seen previously has resolved in the interim. Left UPJ calculus on prior study has been removed. Fragmented calculi are seen within a lower pole calyx, measuring less than 3 mm. The left perirenal hematoma on prior study is unchanged measuring 11 mm in thickness, consistent with known history of recent lithotripsy. The recently placed right ureteral stent has migrated into the bladder lumen. There has been interval development of right-sided obstructive uropathy, though I do not see any residual calculus within the right ureter. Peri ureteral fat stranding surrounding the proximal right ureter. Gas within the right renal collecting system consistent with recent cystoscopy. Minimal excretion of contrast noted in the right kidney on delayed images. Adrenals are unremarkable. Minimal gas within the  bladder lumen consistent with recent cystoscopy. No filling defects. Stomach/Bowel: No bowel obstruction or ileus. Normal appendix right lower quadrant. No bowel wall thickening or inflammatory change. Vascular/Lymphatic: No significant vascular findings are present. No enlarged abdominal or pelvic lymph nodes. Reproductive: Prostate is unremarkable. Other: No free fluid or free gas.  No abdominal wall hernia. Musculoskeletal: No acute or destructive bony lesions. Reconstructed images demonstrate no additional findings. IMPRESSION: 1. Interval development of right-sided obstructive uropathy, with no residual right-sided calculus identified. The right ureteral stent placed earlier today has migrated into the bladder lumen. 2. Stable position of the left ureteral stent, with resolution  of obstructive uropathy seen previously. Residual calculi lower pole left kidney, with stable left perirenal hematoma compatible with recent lithotripsy. 3. Minimal gas within the bladder lumen and right renal calices consistent with recent cystoscopy and ureteroscopy. 4. Patchy lingular airspace disease, which could reflect infection or aspiration. Electronically Signed   By: Randa Ngo M.D.   On: 07/06/2020 20:07   DG OR UROLOGY CYSTO IMAGE ([facility] ONLY)  Result Date: 07/06/2020 There is no interpretation for this exam.  This order is for images obtained during a surgical procedure.  Please See "Surgeries" Tab for more information regarding the procedure.   DG OR UROLOGY CYSTO IMAGE ([facility] ONLY)  Result Date: 07/02/2020 There is no interpretation for this exam.  This order is for images obtained during a surgical procedure.  Please See "Surgeries" Tab for more information regarding the procedure.   DG OR UROLOGY CYSTO IMAGE ([facility] ONLY)  Result Date: 07/02/2020 There is no interpretation for this exam.  This order is for images obtained during a surgical procedure.  Please See "Surgeries" Tab for more information regarding  the procedure.   DG OR UROLOGY CYSTO IMAGE ([facility] ONLY)  Result Date: 07/02/2020 There is no interpretation for this exam.  This order is for images obtained during a surgical procedure.  Please See "Surgeries" Tab for more information regarding the procedure.   CT Renal Stone Study  Result Date: 07/02/2020 CLINICAL DATA:  Left flank pain.  Previous lithotripsy. EXAM: CT ABDOMEN AND PELVIS WITHOUT CONTRAST TECHNIQUE: Multidetector CT imaging of the abdomen and pelvis was performed following the standard protocol without IV contrast. COMPARISON:  06/09/2020 FINDINGS: Lower chest: Normal Hepatobiliary: Normal Pancreas: Normal Spleen: Normal Adrenals/Urinary Tract: Adrenal glands are normal. Right kidney shows a 3 mm stone at the UPJ without gross hydronephrosis. Left kidney shows a 6 mm stone at the left UPJ with mild fullness of the renal collecting system. Nonobstructing 3 mm stone in the lower pole. No stone more distal in either ureter. No stone in the bladder. Stomach/Bowel: Stomach and small intestine are normal. No colon pathology. Normal appendix. Vascular/Lymphatic: Normal Reproductive: Normal Other: No free fluid or air. Musculoskeletal: Normal IMPRESSION: 1. 6 mm stone at the left UPJ/proximal ureter with mild fullness of the renal collecting system. 3 mm nonobstructing stone in the lower pole of the left kidney. 2. 3 mm stone at the right UPJ/proximal ureter without appreciable hydronephrosis. Electronically Signed   By: Nelson Chimes M.D.   On: 07/02/2020 03:56   CT Renal Stone Study  Result Date: 06/09/2020 CLINICAL DATA:  Left groin pain after urinating. EXAM: CT ABDOMEN AND PELVIS WITHOUT CONTRAST TECHNIQUE: Multidetector CT imaging of the abdomen and pelvis was performed following the standard protocol without IV contrast. COMPARISON:  CT dated 12/28/2019 FINDINGS: Lower chest: The lung bases are clear. The heart size is normal. Hepatobiliary: There is decreased hepatic attenuation  suggestive of hepatic steatosis. Normal gallbladder.There is no biliary ductal dilation. Pancreas: Normal contours without ductal dilatation. No peripancreatic fluid collection. Spleen: Unremarkable. Adrenals/Urinary Tract: --Adrenal glands: Unremarkable. --Right kidney/ureter: Are punctate nonobstructing stones involving the right kidney. No right-sided hydronephrosis. --Left kidney/ureter: There is a nonobstructing 5 mm stone in the left renal pelvis. There are additional nonobstructing punctate stones in the lower pole the left kidney. There is no left-sided hydronephrosis. --Urinary bladder: Unremarkable. Stomach/Bowel: --Stomach/Duodenum: No hiatal hernia or other gastric abnormality. Normal duodenal course and caliber. --Small bowel: Unremarkable. --Colon: Unremarkable. --Appendix: Normal. Vascular/Lymphatic: Normal course and caliber of the major abdominal  vessels. --No retroperitoneal lymphadenopathy. --No mesenteric lymphadenopathy. --No pelvic or inguinal lymphadenopathy. Reproductive: Unremarkable Other: No ascites or free air. The abdominal wall is normal. Musculoskeletal. No acute displaced fractures. IMPRESSION: 1. No acute abdominopelvic abnormality. 2. Bilateral nonobstructing nephrolithiasis. 3. Hepatic steatosis. Electronically Signed   By: Constance Holster M.D.   On: 06/09/2020 06:20   CT Angio Abd/Pel W and/or Wo Contrast  Result Date: 06/09/2020 CLINICAL DATA:  Abdominal pain, left groin pain and hematuria. EXAM: CT ANGIOGRAPHY ABDOMEN AND PELVIS WITH CONTRAST AND WITHOUT CONTRAST TECHNIQUE: Multidetector CT imaging of the abdomen and pelvis was performed using the standard protocol during bolus administration of intravenous contrast. Multiplanar reconstructed images and MIPs were obtained and reviewed to evaluate the vascular anatomy. CONTRAST:  161m OMNIPAQUE IOHEXOL 350 MG/ML SOLN COMPARISON:  CT of the abdomen and pelvis without contrast earlier today as well as prior CT of the  abdomen and pelvis without contrast on 12/28/2019 FINDINGS: VASCULAR Aorta: Normal caliber aorta without atherosclerosis, aneurysm, dissection, vasculitis or significant stenosis. Celiac: Patent without evidence of aneurysm, dissection, vasculitis or significant stenosis. SMA: Patent without evidence of aneurysm, dissection, vasculitis or significant stenosis. Renals: Both renal arteries are patent without evidence of aneurysm, dissection, vasculitis, fibromuscular dysplasia or significant stenosis. IMA: Patent without evidence of aneurysm, dissection, vasculitis or significant stenosis. Inflow: Normally patent bilateral iliac arteries without evidence of atherosclerosis or aneurysm. Proximal Outflow: Normally patent bilateral common femoral arteries and femoral bifurcations. Review of the MIP images confirms the above findings. NON-VASCULAR Lower chest: No acute abnormality. Hepatobiliary: No focal liver abnormality is seen. No gallstones, gallbladder wall thickening, or biliary dilatation. Pancreas: Unremarkable. No pancreatic ductal dilatation or surrounding inflammatory changes. Spleen: Normal in size without focal abnormality. Adrenals/Urinary Tract: Adrenal glands are unremarkable. Stable nonobstructing 6 mm calculus in the left renal pelvis and punctate lower pole calculi bilaterally. No evidence of ureteral or bladder calculi. No focal renal masses or hydronephrosis. Bladder is unremarkable. Stomach/Bowel: No evidence of bowel obstruction, ileus, inflammation or lesion. Mild diverticulosis of the sigmoid colon. No free intraperitoneal air. The appendix is normal. Lymphatic: No enlarged abdominal or pelvic lymph nodes. Reproductive: Prostate is unremarkable. Other: Small left inguinal hernia containing fat. No abdominopelvic ascites. Musculoskeletal: No acute or significant osseous findings. IMPRESSION: 1. Unremarkable CTA of the abdomen and pelvis. No arterial vascular abnormalities. 2. Stable nonobstructing  6 mm calculus in the left renal pelvis and punctate lower pole calculi bilaterally. No evidence of ureteral or bladder calculi. No renal masses identified. 3. Mild sigmoid diverticulosis without evidence of acute diverticulitis. 4. Small left inguinal hernia containing fat. Electronically Signed   By: GAletta EdouardM.D.   On: 06/09/2020 08:18    Date of procedure: 07/06/20  Preoperative diagnosis:  1. Left proximal ureteral calculus 2. Right proximal ureteral calculus  Postoperative diagnosis:  1. Bilateral nonobstructing renal calculi   Procedure: 1. Cystoscopy 2. Urethral dilation 3. Left ureteroscopy laser lithotripsy and basket extraction 4. Left retrograde pyelogram 5. Left ureteral stent exchange 6. Right ureteroscopy with laser lithotripsy 7. Right ureteral stent exchange 8. Right retrograde pyelogram 9. Interpretation of fluoroscopy less than 30 minutes  Surgeon: AHollice Espy MD  Anesthesia: General  Complications: None  Intraoperative findings: Previously located left proximal ureteral stone, 6 mm found within a lower pole calyx, completely obliterated treated and all fragments removed via basket.  Stent replaced.  Diagnostic ureteroscopy on the right given presence of right flank discomfort and small nonobstructing right proximal fragment.  A few small stone was  encountered in the renal pelvis as well as few nonobstructing punctate Randall's plaques treated within the right kidney.  Stent also placed into the on the side.  EBL: Minimal  Specimens: 6 x 26 French double-J ureteral stent bilaterally on tethers  Drains: Stone fragment  Indication: Jacob Nixon. is a 29 y.o. patient with left proximal ureteral calculus requiring stent placement for pain who returns today for staged procedure.  He also has an incidental right proximal ureteral nonobstructing fragment with some right-sided pressure.  After reviewing the management options for treatment, he  elected to proceed with the above surgical procedure(s). We have discussed the potential benefits and risks of the procedure, side effects of the proposed treatment, the likelihood of the patient achieving the goals of the procedure, and any potential problems that might occur during the procedure or recuperation. Informed consent has been obtained.  Description of procedure:  The patient was taken to the operating room and general anesthesia was induced.  The patient was placed in the dorsal lithotomy position, prepped and draped in the usual sterile fashion, and preoperative antibiotics were administered. A preoperative time-out was performed.   A 21 French scope was advanced per urethra into the bladder.  Attention was turned to the left ureter orifice which a ureteral stent was seen emanating.  The distal coil of the stent was grasped and brought to level the urethral meatus.  This was then cannulated using a sensor wire up to the level of the kidney.  The stent was removed and the wire snapped in place as a safety wire.  I did have attempted to advance a semirigid ureteroscope through the distal ureter was not able to make much progress here due to his anatomy.  I injected Conray which showed no obvious hydroureteronephrosis or filling defects.  As such, a Super Stiff wire was placed through the scope up to the level of the kidney as a working wire.  I then used a Lacinda Axon 12/14 [place] access sheath along this wire under fluoroscopic guidance which was advanced easily to the proximal ureter.  The inner cannula and wire were then removed.  The scope was advanced to the renal pelvis and each every calyx was directly visualized.  I encountered the 6 mm stone in a lower pole.  Presumably been pushed here at the time of stent placement or perhaps with wire manipulation earlier in the procedure.  I brought in a 242 m laser fiber and using the settings of 0.2 J and 40 Hz, I fragmented the stone into 6 or 7  smaller pieces.  I then used a nitinol basket to remove each and every fragment such that the entire collecting system was clear.  Final retrograde pyelogram showed no contrast extravasation and created roadmap to ensure that each calyx was visualized.  Then slowly backed the access sheath out along with the scope inspecting along the way.  There were no ureteral injuries and only a mild amount of edema from the previous stent.  I left this wire in place until the end of the procedure at which time a 6 x 26 French stent was placed fluoroscopically with a coil of both within the renal pelvis as well as within the bladder left on a tether.  Attention was then turned to the right ureteral orifice.  I did use male sounds again to dilate the urethral meatus as I did at the beginning of the case to accommodate the scope.  Attention was turned to  the right ureteral orifice.  Using a wire and open-ended ureteral catheter, cannulated the UO and injected contrast.  The ureter itself appeared very delicate and the collecting system was completely decompressed.  Given his right flank pressures on his intolerance of stenting stones in terms of pain control, I like to go ahead and perform diagnostic ureteroscopy on the side as a precaution.  A Super Stiff wire was advanced to the level of the kidney.  I then advanced the scope which went quite easily up to the renal pelvis.  A very small approximately 2 to 3 mm fragment was identified within the renal pelvis.  I obliterated this using the same laser and laser settings.  Each every calyx was directly visualized and no additional fragments were identified.  A final retrograde pyelogram again showed no contrast and no additional filling defects or residual stone burden.  The scope was then backed down the length of the ureter.  I ended up placing another 6 x 26 French double-J ureteral stent on the side as well on a tether using the same aforementioned technique in the setting of  bilateral ureteral manipulation.  The bladder was then drained.  This then strings were affixed to the patient's groin using Mastisol and Tegaderm.  The patient was then cleaned and dried, repositioned in supine position reversed myesthesia and taken to the PACU in stable condition.  Notably, given his history of severe stent discomfort and intolerance, I did place a BNO suppository prior to him awakening and we try to optimize pain medication.  Plan: Postoperative plan was communicated with the hospitalist.  He may be discharged once his pain is adequately controlled.  We will have him remove his own stent on Monday.  We will have him follow-up in 4 weeks with renal ultrasound prior.  This plan was also discussed with his mother with whom he is currently living.  Jacob Nixon, M.D.           Subjective: Flank pain resolved, ability to be restored, still some hematuria/gross.  No vomiting, confusion, fever, cough, dyspnea, chest discomfort.      Discharge Exam: Vitals:   07/07/20 0340 07/07/20 0749  BP: 115/63 100/72  Pulse: 72 84  Resp: 18 18  Temp: 98 F (36.7 C) 97.9 F (36.6 C)  SpO2: 97% 98%   Vitals:   07/06/20 1658 07/06/20 2020 07/07/20 0340 07/07/20 0749  BP: 108/68 (!) 95/58 115/63 100/72  Pulse: 77 70 72 84  Resp: '16 20 18 18  ' Temp: 98.4 F (36.9 C) 97.7 F (36.5 C) 98 F (36.7 C) 97.9 F (36.6 C)  TempSrc: Oral Oral    SpO2: 98% 100% 97% 98%  Weight:      Height:       General appearance: Adult male, lying in bed, no acute distress     HEENT:   Skin:  Cardiac: RRR, no murmurs, no lower extremity edema Respiratory: Normal respiratory rate and rhythm, lungs clear without rales wheezes Abdomen: Abdomen soft without signs of patient. MSK:  Neuro: Extraocular movements intact, moves all extremities with generalized weakness but normal coordination, speech fluent Psych: Attention normal, affect normal, judgment insight appear normal     The  results of significant diagnostics from this hospitalization (including imaging, microbiology, ancillary and laboratory) are listed below for reference.     Microbiology: Recent Results (from the past 240 hour(s))  Urine Culture     Status: None   Collection Time: 07/02/20 12:01 AM  Specimen: Urine, Clean Catch  Result Value Ref Range Status   Specimen Description   Final    URINE, CLEAN CATCH Performed at [facility], [address], [place], [place] [address]    Special Requests   Final    NONE Performed at [facility], [address] [place], [place] [address]    Culture   Final    NO GROWTH Performed at [facility], [place] [address], [place], [place] [address]    Report Status 07/03/2020 FINAL  Final  Resp Panel by RT-PCR (Flu A&B, Covid) Nasopharyngeal Swab     Status: None   Collection Time: 07/02/20  5:41 AM   Specimen: Nasopharyngeal Swab; Nasopharyngeal(NP) swabs in vial transport medium  Result Value Ref Range Status   SARS Coronavirus 2 by RT PCR NEGATIVE NEGATIVE Final    Comment: (NOTE) SARS-CoV-2 target nucleic acids are NOT DETECTED.  The SARS-CoV-2 RNA is generally detectable in upper respiratory specimens during the acute phase of infection. The lowest concentration of SARS-CoV-2 viral copies this assay can detect is 138 copies/mL. A negative result does not preclude SARS-Cov-2 infection and should not be used as the sole basis for treatment or other patient management decisions. A negative result may occur with  improper specimen collection/handling, submission of specimen other than nasopharyngeal swab, presence of viral mutation(s) within the areas targeted by this assay, and inadequate number of viral copies(<138 copies/mL). A negative result must be combined with clinical observations, patient history, and epidemiological information. The expected result is Negative.  Fact Sheet for Patients:   EntrepreneurPulse.com.au  Fact Sheet for Healthcare Providers:  IncredibleEmployment.be  This test is no t yet approved or cleared by the [place] FDA and  has been authorized for detection and/or diagnosis of SARS-CoV-2 by FDA under an Emergency Use Authorization (EUA). This EUA will remain  in effect (meaning this test can be used) for the duration of the COVID-19 declaration under Section 564(b)(1) of the Act, 21 U.S.C.section 360bbb-3(b)(1), unless the authorization is terminated  or revoked sooner.       Influenza A by PCR NEGATIVE NEGATIVE Final   Influenza B by PCR NEGATIVE NEGATIVE Final    Comment: (NOTE) The Xpert Xpress SARS-CoV-2/FLU/RSV plus assay is intended as an aid in the diagnosis of influenza from Nasopharyngeal swab specimens and should not be used as a sole basis for treatment. Nasal washings and aspirates are unacceptable for Xpert Xpress SARS-CoV-2/FLU/RSV testing.  Fact Sheet for Patients: EntrepreneurPulse.com.au  Fact Sheet for Healthcare Providers: IncredibleEmployment.be  This test is not yet approved or cleared by the [place] FDA and has been authorized for detection and/or diagnosis of SARS-CoV-2 by FDA under an Emergency Use Authorization (EUA). This EUA will remain in effect (meaning this test can be used) for the duration of the COVID-19 declaration under Section 564(b)(1) of the Act, 21 U.S.C. section 360bbb-3(b)(1), unless the authorization is terminated or revoked.  Performed at [facility], [address] [place], [place] [address]   Culture, blood (x 2)     Status: None   Collection Time: 07/02/20  8:00 AM   Specimen: BLOOD  Result Value Ref Range Status   Specimen Description BLOOD RIGHT ANTECUBITAL  Final   Special Requests   Final    BOTTLES DRAWN AEROBIC AND ANAEROBIC Blood Culture adequate volume   Culture   Final    NO GROWTH 5  DAYS Performed at [facility], [place], [place]  [address]    Report Status 07/07/2020 FINAL  Final  Culture, blood (x 2)     Status: None   Collection Time: 07/02/20  8:00 AM   Specimen: BLOOD  Result Value Ref Range Status   Specimen Description BLOOD LEFT ANTECUBITAL  Final   Special Requests   Final    BOTTLES DRAWN AEROBIC AND ANAEROBIC Blood Culture adequate volume   Culture   Final    NO GROWTH 5 DAYS Performed at [facility], [address] [place], [place] [address]    Report Status 07/07/2020 FINAL  Final  Urine Culture     Status: None   Collection Time: 07/02/20  4:26 PM   Specimen: Kidney; Urine  Result Value Ref Range Status   Specimen Description   Final    KIDNEY left kidney Performed at [facility], [address] [place], [place] [address]    Special Requests   Final    clindamycin, rocephin Performed at [facility], [address] [place], [place] [address]    Culture   Final    NO GROWTH Performed at [facility], [place] [address], [place], [place] [address]    Report Status 07/03/2020 FINAL  Final     Labs: BNP (last 3 results) No results for input(s): BNP in the last 8760 hours. Basic Metabolic Panel: Recent Labs  Lab 07/02/20 0001 07/03/20 0521 07/06/20 0412  NA 136 134* 139  K 3.8 4.3 4.0  CL 103 104 103  CO2 '24 24 29  ' GLUCOSE 111* 157* 116*  BUN '12 8 10  ' CREATININE 0.99 0.78 0.95  CALCIUM 9.0 8.9 8.9   Liver Function Tests: Recent Labs  Lab 07/06/20 0412  AST 19  ALT 22  ALKPHOS 71  BILITOT 0.4  PROT 6.5  ALBUMIN 3.0*   No results for input(s): LIPASE, AMYLASE in the last 168 hours. No results for input(s): AMMONIA in the last 168 hours. CBC: Recent Labs  Lab 07/02/20 0001 07/03/20 0521 07/06/20 0412  WBC 13.3* 9.1 12.7*  HGB 13.8 13.9 13.3  HCT 41.2 40.7 40.1  MCV 85.1 85.1 86.8  PLT 309 326 301   Cardiac Enzymes: No results for input(s): CKTOTAL,  CKMB, CKMBINDEX, TROPONINI in the last 168 hours. BNP: Invalid input(s): POCBNP CBG: Recent Labs  Lab 07/04/20 0843 07/06/20 0730 07/06/20 1127 07/07/20 0802  GLUCAP 110* 94 91 93   D-Dimer No results for input(s): DDIMER in the last 72 hours. Hgb A1c No results for input(s): HGBA1C in the last 72 hours. Lipid Profile No results for input(s): CHOL, HDL, LDLCALC, TRIG, CHOLHDL, LDLDIRECT in the last 72 hours. Thyroid function studies No results for input(s): TSH, T4TOTAL, T3FREE, THYROIDAB in the last 72 hours.  Invalid input(s): FREET3 Anemia work up No results for input(s): VITAMINB12, FOLATE, FERRITIN, TIBC, IRON, RETICCTPCT in the last 72 hours. Urinalysis    Component Value Date/Time   COLORURINE YELLOW (A) 07/02/2020 0001   APPEARANCEUR HAZY (A) 07/02/2020 0001   APPEARANCEUR Cloudy (A) 06/15/2020 1411   LABSPEC 1.013 07/02/2020 0001   PHURINE 6.0 07/02/2020 0001   GLUCOSEU NEGATIVE 07/02/2020 0001   HGBUR LARGE (A) 07/02/2020 0001   BILIRUBINUR NEGATIVE 07/02/2020 0001   BILIRUBINUR Negative 06/15/2020 1411   KETONESUR NEGATIVE 07/02/2020 0001   PROTEINUR NEGATIVE 07/02/2020 0001   UROBILINOGEN 1.0 11/28/2014 1026   NITRITE NEGATIVE 07/02/2020 0001   LEUKOCYTESUR SMALL (A) 07/02/2020 0001   Sepsis Labs Invalid input(s): PROCALCITONIN,  WBC,  [place] [facility] Recent Results (from the past 240 hour(s))  Urine Culture     Status: None   Collection Time: 07/02/20 12:01 AM   Specimen: Urine, Clean Catch  Result Value Ref Range Status   Specimen Description   Final    URINE, CLEAN CATCH Performed at [facility], [address] [place], [place] [address]    Special Requests   Final    NONE Performed at [facility], [address] [place], [place] [address]    Culture   Final    NO GROWTH Performed at [facility], [place] [address], [place], [place] [address]    Report Status 07/03/2020 FINAL  Final  Resp Panel by RT-PCR  (Flu A&B, Covid) Nasopharyngeal Swab     Status: None   Collection Time: 07/02/20  5:41 AM   Specimen: Nasopharyngeal Swab; Nasopharyngeal(NP) swabs in vial transport medium  Result Value Ref Range Status   SARS Coronavirus 2 by RT PCR NEGATIVE NEGATIVE Final    Comment: (NOTE) SARS-CoV-2 target nucleic acids are NOT DETECTED.  The SARS-CoV-2 RNA is generally detectable in upper respiratory specimens during the acute phase of infection. The lowest concentration of SARS-CoV-2 viral copies this assay can detect is 138 copies/mL. A negative result does not preclude SARS-Cov-2 infection and should not be used as the sole basis for treatment or other patient management decisions. A negative result may occur with  improper specimen collection/handling, submission of specimen other than nasopharyngeal swab, presence of viral mutation(s) within the areas targeted by this assay, and inadequate number of viral copies(<138 copies/mL). A negative result must be combined with clinical observations, patient history, and epidemiological information. The expected result is Negative.  Fact Sheet for Patients:  EntrepreneurPulse.com.au  Fact Sheet for Healthcare Providers:  IncredibleEmployment.be  This test is no t yet approved or cleared by the [place] FDA and  has been authorized for detection and/or diagnosis of SARS-CoV-2 by FDA under an Emergency Use Authorization (EUA). This EUA will remain  in effect (meaning this test can be used) for the duration of the COVID-19 declaration under Section 564(b)(1) of the Act, 21 U.S.C.section 360bbb-3(b)(1), unless the authorization is terminated  or revoked sooner.       Influenza A by PCR NEGATIVE NEGATIVE Final   Influenza B by PCR NEGATIVE NEGATIVE Final    Comment: (NOTE) The Xpert Xpress SARS-CoV-2/FLU/RSV plus assay is intended as an aid in the diagnosis of influenza from Nasopharyngeal swab specimens  and should not be used as a sole basis for treatment. Nasal washings and aspirates are unacceptable for Xpert Xpress SARS-CoV-2/FLU/RSV testing.  Fact Sheet for Patients: EntrepreneurPulse.com.au  Fact Sheet for Healthcare Providers: IncredibleEmployment.be  This test is not yet approved or cleared by the [place] FDA and has been authorized for detection and/or diagnosis of SARS-CoV-2 by FDA under an Emergency Use Authorization (EUA). This EUA will remain in effect (meaning this test can be used) for the duration of the COVID-19 declaration under Section 564(b)(1) of the Act, 21 U.S.C. section 360bbb-3(b)(1), unless the authorization is terminated or revoked.  Performed at [facility], [place] [place], [place] [address]   Culture, blood (x 2)     Status: None   Collection Time: 07/02/20  8:00 AM   Specimen: BLOOD  Result Value Ref Range Status   Specimen Description BLOOD RIGHT ANTECUBITAL  Final   Special Requests   Final    BOTTLES DRAWN AEROBIC AND ANAEROBIC Blood  Culture adequate volume   Culture   Final    NO GROWTH 5 DAYS Performed at [facility], [address] [place], [place] [address]    Report Status 07/07/2020 FINAL  Final  Culture, blood (x 2)     Status: None   Collection Time: 07/02/20  8:00 AM   Specimen: BLOOD  Result Value Ref Range Status   Specimen Description BLOOD LEFT ANTECUBITAL  Final   Special Requests   Final    BOTTLES DRAWN AEROBIC AND ANAEROBIC Blood Culture adequate volume   Culture   Final    NO GROWTH 5 DAYS Performed at [facility], [address], [place], [place] [address]    Report Status 07/07/2020 FINAL  Final  Urine Culture     Status: None   Collection Time: 07/02/20  4:26 PM   Specimen: Kidney; Urine  Result Value Ref Range Status   Specimen Description   Final    KIDNEY left kidney Performed at [facility], [address]  [place], [place] [address]    Special Requests   Final    clindamycin, rocephin Performed at [facility], [address] [place], [place] [address]    Culture   Final    NO GROWTH Performed at [facility], [place] [address] [place], [place] [address]    Report Status 07/03/2020 FINAL  Final     Time coordinating discharge: 25 minutes The [place] controlled substances registry was reviewed for this patient prior to filling the <5 days supply controlled substances script.      SIGNED:   Edwin Dada, MD  [facility] 07/07/2020, 2:08 PM

## 2020-07-06 NOTE — Anesthesia Procedure Notes (Signed)
Procedure Name: LMA Insertion Date/Time: 07/06/2020 1:02 PM Performed by: Karoline Caldwell, CRNA Pre-anesthesia Checklist: Patient identified, Patient being monitored, Timeout performed, Emergency Drugs available and Suction available Patient Re-evaluated:Patient Re-evaluated prior to induction Oxygen Delivery Method: Circle system utilized Preoxygenation: Pre-oxygenation with 100% oxygen Induction Type: IV induction Ventilation: Mask ventilation without difficulty LMA: LMA inserted LMA Size: 4.5 Tube type: Oral Number of attempts: 1 Placement Confirmation: positive ETCO2 and breath sounds checked- equal and bilateral Tube secured with: Tape Dental Injury: Teeth and Oropharynx as per pre-operative assessment

## 2020-07-06 NOTE — Progress Notes (Signed)
Patient having pain and unable to void, MD aware, and verbal received from primary RN to hold bladder scan

## 2020-07-06 NOTE — Progress Notes (Signed)
Patient complains of right flank pain 10/10, Dr. Apolinar Junes aware but did not give any orders, & Dr. Maryfrances Bunnell aware and CT scan was ordered. Patient and mother updated on plan of care and 4 mg of Morphine was given.

## 2020-07-06 NOTE — Discharge Instructions (Signed)
Kidney Stones Kidney stones are rock-like masses that form inside of the kidneys. Kidneys are organs that make pee (urine). A kidney stone may move into other parts of the urinary tract, including:  The tubes that connect the kidneys to the bladder (ureters).  The bladder.  The tube that carries urine out of the body (urethra). Kidney stones can cause very bad pain and can block the flow of pee. The stone usually leaves your body (passes) through your pee. You may need to have a doctor take out the stone. What are the causes? Kidney stones may be caused by:  A condition in which certain glands make too much parathyroid hormone (primary hyperparathyroidism).  A buildup of a type of crystals in the bladder made of a chemical called uric acid. The body makes uric acid when you eat certain foods.  Narrowing (stricture) of one or both of the ureters.  A kidney blockage that you were born with.  Past surgery on the kidney or the ureters, such as gastric bypass surgery. What increases the risk? You are more likely to develop this condition if:  You have had a kidney stone in the past.  You have a family history of kidney stones.  You do not drink enough water.  You eat a diet that is high in protein, salt (sodium), or sugar.  You are overweight or very overweight (obese). What are the signs or symptoms? Symptoms of a kidney stone may include:  Pain in the side of the belly, right below the ribs (flank pain). Pain usually spreads (radiates) to the groin.  Needing to pee often or right away (urgently).  Pain when going pee (urinating).  Blood in your pee (hematuria).  Feeling like you may vomit (nauseous).  Vomiting.  Fever and chills. How is this treated? Treatment depends on the size, location, and makeup of the kidney stones. The stones will often pass out of the body through peeing. You may need to:  Drink more fluid to help pass the stone. In some cases, you may be  given fluids through an IV tube put into one of your veins at the hospital.  Take medicine for pain.  Make changes in your diet to help keep kidney stones from coming back. Sometimes, medical procedures are needed to remove a kidney stone. This may involve:  A procedure to break up kidney stones using a beam of light (laser) or shock waves.  Surgery to remove the kidney stones. Follow these instructions at home: Medicines  Take over-the-counter and prescription medicines only as told by your doctor.  Ask your doctor if the medicine prescribed to you requires you to avoid driving or using heavy machinery. Eating and drinking  Drink enough fluid to keep your pee pale yellow. You may be told to drink at least 8-10 glasses of water each day. This will help you pass the stone.  If told by your doctor, change your diet. This may include: ? Limiting how much salt you eat. ? Eating more fruits and vegetables. ? Limiting how much meat, poultry, fish, and eggs you eat.  Follow instructions from your doctor about eating or drinking restrictions. General instructions  Collect pee samples as told by your doctor. You may need to collect a pee sample: ? 24 hours after a stone comes out. ? 8-12 weeks after a stone comes out, and every 6-12 months after that.  Strain your pee every time you pee (urinate), for as long as told. Use  the strainer that your doctor recommends.  Do not throw out the stone. Keep it so that it can be tested by your doctor.  Keep all follow-up visits as told by your doctor. This is important. You may need follow-up tests. How is this prevented? To prevent another kidney stone:  Drink enough fluid to keep your pee pale yellow. This is the best way to prevent kidney stones.  Eat healthy foods.  Avoid certain foods as told by your doctor. You may be told to eat less protein.  Stay at a healthy weight.   Where to find more information  National Kidney Foundation  (NKF): www.kidney.org  [facility]): www.urologyhealth.org Contact a doctor if:  You have pain that gets worse or does not get better with medicine. Get help right away if:  You have a fever or chills.  You get very bad pain.  You get new pain in your belly (abdomen).  You pass out (faint).  You cannot pee. Summary  Kidney stones are rock-like masses that form inside of the kidneys.  Kidney stones can cause very bad pain and can block the flow of pee.  The stones will often pass out of the body through peeing.  Drink enough fluid to keep your pee pale yellow. This information is not intended to replace advice given to you by your health care provider. Make sure you discuss any questions you have with your health care provider. Document Revised: 09/22/2018 Document Reviewed: 09/22/2018 Elsevier Patient Education  2021 Elsevier Inc. You have a ureteral stent in place.  This is a tube that extends from your kidney to your bladder.  This may cause urinary bleeding, burning with urination, and urinary frequency.  Please call our office or present to the ED if you develop fevers >101 or pain which is not able to be controlled with oral pain medications.  You may be given either Flomax and/ or ditropan to help with bladder spasms and stent pain in addition to pain medications.    Your stent is attached to strings.  These are taped to the head of your penis.  On Monday morning, you may untape the stent string and pulled gently until both stents are removed.  If you have any issues during this result, please call our office and we can arrange for same-day work and have one of our staff remove your stents.  If the tape does not loosen easily, you may soak in the bathtub or use a damp cloth to help loosen the adhesive.  [facility] [address], [address] [place], [place] [address] [phone]

## 2020-07-07 ENCOUNTER — Encounter: Payer: Self-pay | Admitting: Urology

## 2020-07-07 ENCOUNTER — Other Ambulatory Visit: Payer: Self-pay

## 2020-07-07 ENCOUNTER — Telehealth: Payer: Self-pay | Admitting: Urology

## 2020-07-07 DIAGNOSIS — R109 Unspecified abdominal pain: Secondary | ICD-10-CM | POA: Diagnosis not present

## 2020-07-07 DIAGNOSIS — R310 Gross hematuria: Secondary | ICD-10-CM

## 2020-07-07 DIAGNOSIS — R319 Hematuria, unspecified: Secondary | ICD-10-CM | POA: Insufficient documentation

## 2020-07-07 DIAGNOSIS — N200 Calculus of kidney: Secondary | ICD-10-CM

## 2020-07-07 DIAGNOSIS — Z87891 Personal history of nicotine dependence: Secondary | ICD-10-CM | POA: Insufficient documentation

## 2020-07-07 LAB — GLUCOSE, CAPILLARY: Glucose-Capillary: 93 mg/dL (ref 70–99)

## 2020-07-07 LAB — CULTURE, BLOOD (ROUTINE X 2)
Culture: NO GROWTH
Culture: NO GROWTH
Special Requests: ADEQUATE
Special Requests: ADEQUATE

## 2020-07-07 MED ORDER — IBUPROFEN 400 MG PO TABS: 600.0000 mg | ORAL_TABLET | Freq: Once | ORAL | Status: DC

## 2020-07-07 MED ORDER — CALCIUM CARBONATE ANTACID 500 MG PO CHEW: 3.0000 | CHEWABLE_TABLET | Freq: Once | ORAL | Status: DC

## 2020-07-07 MED ORDER — OXYCODONE HCL 5 MG PO TABS
10.0000 mg | ORAL_TABLET | Freq: Four times a day (QID) | ORAL | Status: DC | PRN
  Administered 2020-07-07: 10 mg via ORAL
  Filled 2020-07-07: qty 2

## 2020-07-07 MED ORDER — PROPOFOL 500 MG/50ML IV EMUL
INTRAVENOUS | Status: AC
  Filled 2020-07-07: qty 100

## 2020-07-07 MED ORDER — OXYCODONE HCL 5 MG PO TABS
5.0000 mg | ORAL_TABLET | Freq: Once | ORAL | Status: AC
Start: 2020-07-07 — End: 2020-07-07
  Administered 2020-07-07: 5 mg via ORAL
  Filled 2020-07-07: qty 1

## 2020-07-07 MED ORDER — EPHEDRINE 5 MG/ML INJ
INTRAVENOUS | Status: AC
  Filled 2020-07-07: qty 10

## 2020-07-07 MED ORDER — OXYCODONE HCL 10 MG PO TABS: 10.0000 mg | ORAL_TABLET | Freq: Two times a day (BID) | ORAL | 0 refills | Status: AC | PRN

## 2020-07-07 MED ORDER — PROPOFOL 500 MG/50ML IV EMUL
INTRAVENOUS | Status: AC
  Filled 2020-07-07: qty 50

## 2020-07-07 NOTE — Progress Notes (Signed)
Urology Consult Follow Up  Subjective: Patient sitting in bed with 5 out of 10 pain.  He states the pain is in the right flank area.  He is voiding with gross hematuria which is to be expected.  Unfortunately, his right ureteral stent had migrated into his bladder and both stents were removed last night.    VSS afebrile.  Anti-infectives: Anti-infectives (From admission, onward)   Start     Dose/Rate Route Frequency Ordered Stop   07/06/20 0000  doxycycline (VIBRA-TABS) 100 MG tablet        100 mg Oral Every 12 hours 07/06/20 1636     07/03/20 0700  cefTRIAXone (ROCEPHIN) 2 g in sodium chloride 0.9 % 100 mL IVPB  Status:  Discontinued        2 g 200 mL/hr over 30 Minutes Intravenous Every 24 hours 07/02/20 0726 07/04/20 1001   07/02/20 1500  doxycycline (VIBRA-TABS) tablet 100 mg        100 mg Oral Every 12 hours 07/02/20 0726     07/02/20 0730  cefTRIAXone (ROCEPHIN) 1 g in sodium chloride 0.9 % 100 mL IVPB  Status:  Discontinued        1 g 200 mL/hr over 30 Minutes Intravenous  Once 07/02/20 0725 07/04/20 1001   07/02/20 0315  cefTRIAXone (ROCEPHIN) 500 mg in dextrose 5 % 50 mL IVPB  Status:  Discontinued        500 mg 100 mL/hr over 30 Minutes Intravenous  Once 07/02/20 0303 07/02/20 0309   07/02/20 0315  doxycycline (VIBRA-TABS) tablet 100 mg        100 mg Oral  Once 07/02/20 0303 07/02/20 0309   07/02/20 0315  cefTRIAXone (ROCEPHIN) 1 g in sodium chloride 0.9 % 100 mL IVPB        1 g 200 mL/hr over 30 Minutes Intravenous  Once 07/02/20 0309 07/02/20 0544   07/02/20 0000  doxycycline (VIBRAMYCIN) 100 MG capsule  Status:  Discontinued        100 mg Oral 2 times daily 07/02/20 0443 07/06/20    07/02/20 0000  cephALEXin (KEFLEX) 500 MG capsule  Status:  Discontinued        500 mg Oral 2 times daily 07/02/20 0443 07/06/20       Current Facility-Administered Medications  Medication Dose Route Frequency Provider Last Rate Last Admin  . 0.9 %  sodium chloride infusion    Intravenous PRN Vanna Scotland, MD   Stopped at 07/03/20 [phone]  . acetaminophen (TYLENOL) tablet 500 mg  500 mg Oral TID Vanna Scotland, MD   500 mg at 07/06/20 2152  . doxycycline (VIBRA-TABS) tablet 100 mg  100 mg Oral Q12H Vanna Scotland, MD   100 mg at 07/06/20 2153  . ketorolac (TORADOL) 30 MG/ML injection 30 mg  30 mg Intravenous Q8H Danford, Earl Lites, MD   30 mg at 07/07/20 0448  . melatonin tablet 3 mg  3 mg Oral QHS Vanna Scotland, MD   3 mg at 07/06/20 2321  . ondansetron (ZOFRAN) injection 4 mg  4 mg Intravenous Q8H PRN Vanna Scotland, MD   4 mg at 07/06/20 0558  . oxybutynin (DITROPAN) tablet 5 mg  5 mg Oral TID Vanna Scotland, MD   5 mg at 07/06/20 2152  . oxyCODONE (Oxy IR/ROXICODONE) immediate release tablet 10 mg  10 mg Oral Q6H PRN Alberteen Sam, MD   10 mg at 07/07/20 0749  . tamsulosin (FLOMAX) capsule 0.8 mg  0.8 mg Oral Daily  Vanna Scotland, MD   0.8 mg at 07/06/20 1031  . traZODone (DESYREL) tablet 50 mg  50 mg Oral QHS Vanna Scotland, MD   50 mg at 07/06/20 2321     Objective: Vital signs in last 24 hours: Temp:  [97.6 F (36.4 C)-98.5 F (36.9 C)] 97.9 F (36.6 C) (02/18 0749) Pulse Rate:  [70-108] 84 (02/18 0749) Resp:  [0-20] 18 (02/18 0749) BP: (95-160)/(58-96) 100/72 (02/18 0749) SpO2:  [95 %-100 %] 98 % (02/18 0749) Weight:  [114.8 kg] 114.8 kg (02/17 1240)  Intake/Output from previous day: 02/17 0701 - 02/18 0700 In: -  Out: 1500 [Urine:1500] Intake/Output this shift: No intake/output data recorded.   Physical Exam Constitutional:  Well nourished. Alert and oriented, No acute distress. HEENT: [place] AT, moist mucus membranes.  Trachea midline Cardiovascular: No clubbing, cyanosis, or edema. Respiratory: Normal respiratory effort, no increased work of breathing. Neurologic: Grossly intact, no focal deficits, moving all 4 extremities. Psychiatric: Normal mood and affect.  Lab Results:  Recent Labs    07/06/20 0412  WBC 12.7*   HGB 13.3  HCT 40.1  PLT 301   BMET Recent Labs    07/06/20 0412  NA 139  K 4.0  CL 103  CO2 29  GLUCOSE 116*  BUN 10  CREATININE 0.95  CALCIUM 8.9   PT/INR No results for input(s): LABPROT, INR in the last 72 hours. ABG No results for input(s): PHART, HCO3 in the last 72 hours.  Invalid input(s): PCO2, PO2  Studies/Results: CT ABDOMEN PELVIS W CONTRAST  Result Date: 07/06/2020 CLINICAL DATA:  Acute onset right flank pain, history of renal calculi and left ureteral stent placement EXAM: CT ABDOMEN AND PELVIS WITH CONTRAST TECHNIQUE: Multidetector CT imaging of the abdomen and pelvis was performed using the standard protocol following bolus administration of intravenous contrast. CONTRAST:  OMNIPAQUE IOHEXOL 300 MG/ML  SOLN COMPARISON:  07/02/2020, 07/06/2020 FINDINGS: Lower chest: Patchy airspace disease within the lingula could reflect infection or aspiration. No effusion or pneumothorax. Hepatobiliary: No focal liver abnormality is seen. No gallstones, gallbladder wall thickening, or biliary dilatation. Pancreas: Unremarkable. No pancreatic ductal dilatation or surrounding inflammatory changes. Spleen: Normal in size without focal abnormality. Adrenals/Urinary Tract: There has been interval placement of a left ureteral stent, extending from the left renal pelvis to the bladder lumen. The left-sided obstructive uropathy seen previously has resolved in the interim. Left UPJ calculus on prior study has been removed. Fragmented calculi are seen within a lower pole calyx, measuring less than 3 mm. The left perirenal hematoma on prior study is unchanged measuring 11 mm in thickness, consistent with known history of recent lithotripsy. The recently placed right ureteral stent has migrated into the bladder lumen. There has been interval development of right-sided obstructive uropathy, though I do not see any residual calculus within the right ureter. Peri ureteral fat stranding  surrounding the proximal right ureter. Gas within the right renal collecting system consistent with recent cystoscopy. Minimal excretion of contrast noted in the right kidney on delayed images. Adrenals are unremarkable. Minimal gas within the bladder lumen consistent with recent cystoscopy. No filling defects. Stomach/Bowel: No bowel obstruction or ileus. Normal appendix right lower quadrant. No bowel wall thickening or inflammatory change. Vascular/Lymphatic: No significant vascular findings are present. No enlarged abdominal or pelvic lymph nodes. Reproductive: Prostate is unremarkable. Other: No free fluid or free gas.  No abdominal wall hernia. Musculoskeletal: No acute or destructive bony lesions. Reconstructed images demonstrate no additional findings. IMPRESSION: 1. Interval  development of right-sided obstructive uropathy, with no residual right-sided calculus identified. The right ureteral stent placed earlier today has migrated into the bladder lumen. 2. Stable position of the left ureteral stent, with resolution of obstructive uropathy seen previously. Residual calculi lower pole left kidney, with stable left perirenal hematoma compatible with recent lithotripsy. 3. Minimal gas within the bladder lumen and right renal calices consistent with recent cystoscopy and ureteroscopy. 4. Patchy lingular airspace disease, which could reflect infection or aspiration. Electronically Signed   By: Sharlet Salina M.D.   On: 07/06/2020 20:07   DG OR UROLOGY CYSTO IMAGE ([facility] ONLY)  Result Date: 07/06/2020 There is no interpretation for this exam.  This order is for images obtained during a surgical procedure.  Please See "Surgeries" Tab for more information regarding the procedure.     Assessment and Plan: 30 year old male who was admitted for intractable pain and underwent urgent left ureteral stent placement on July 02, 2020 for 6 mm left renal pelvic stone that remained after left ESWL that was performed  on June 22, 2020.  He was also found to have a 3 mm stone at the right proximal ureter without hydronephrosis during ED visit.  He underwent left ureteroscopy with laser lithotripsy and left ureteral stent exchange and right ureteroscopy with laser lithotripsy and right ureteral stent placement yesterday.  Patient continued to have intense pain after the procedure, so a CT scan was obtained for further evaluation.  It found that the right ureteral stent had migrated into the bladder, but the left ureteral stent remained in position.  The right stent was removed by nursing staff and the left stent came out as well.  Recommendations: -Continue to wean off IV pain medication in preparation for discharge today -Continue Flomax 0.8 mg daily to help with any ureteral edema that may occur after stents have been removed -Will need follow up in 4 weeks with an renal ultrasound prior with [place]      LOS: 5 days    [facility] 07/07/2020

## 2020-07-07 NOTE — Telephone Encounter (Signed)
He should be discharged today and he will need a follow-up with Dr. Apolinar Junes with renal ultrasound prior in 4 weeks.  I will put in the renal ultrasound order.

## 2020-07-07 NOTE — Progress Notes (Signed)
Patient and mother given instructions to keep all appointments, when to return for worsening symptoms, both IVs discontinued without bleeding, medications were reviewed, and patient discharged via wheelchair with all belongings.

## 2020-07-07 NOTE — ED Triage Notes (Addendum)
Pt presents to ER via [facility] with c/o right sided flank pain since 2130 tonight.  Pt had BIL kidney stents placed yesterday, but there were complications and stents were removed yesterday as well.  Pt DC'd from hospital today.  Pt endorses extreme right sided flank pain, along with hematuria.  meds PTA: 100 mcg fentanyl, 4 mg zofran

## 2020-07-07 NOTE — Progress Notes (Signed)
[facility] PROGRESS NOTE    Cornelius MorasAdrian L Luczak Jr.  RUE:[id number] DOB: Apr 13, 1991 DOA: 07/02/2020 PCP: Patient, No Pcp Per      Brief Narrative:  Mr. Freida Busmanllen is a 30 y.o. M with a recurrent cellulitis (?  [place]), nephrolithiasis with recent lithotripsy of left UPJ stone on 2/3 who presented with vomiting.  He had leukocytosis, tachycardia, started on IV fluids and antibiotics and admitted to the hospitalist service.        Assessment & Plan:  Nephrolithiasis with obstructive uropathy, and unremitting pain Sepsis was ruled out   Patient underwent cystoscopy and double stent placement today.  Post-procedure, he had pain out of proportion to expected.  CT showed dislodgment of his right stnet.  Discused with Urology on Call, Dr. Richardo HanksSninsky, who recommended removal of right stent, with awareness that this might also remove left stent, which it did.  Patient required IV morphine and three doses of IV dilaudid despite Flomax, Ditropan, and Toradol  His symptoms did improve after stent reomval.  He voided with mild difficult.y     STD exposure Continue doxycycline, this is for empiric treatment of chlamydia, not pyelonephritis.         Disposition: Status is: Inpatient  Remains inpatient appropriate because:Ongoing active pain requiring inpatient pain management   Dispo: The patient is from: Home              Anticipated d/c is to: Home              Anticipated d/c date is: 1 day              Patient currently is not medically stable to d/c.   Difficult to place patient No       Level of care: Med-Surg       MDM: The below labs and imaging reports were reviewed and summarized above.  Medication management as above.  Frequent dosing of IV opiates.     DVT prophylaxis:   Code Status: Full code Family Communication: Mother at the bedside    Consultants:   Urology  Procedures:   2/17 cystoscopy              Subjective: Patient with excruciating complaint, "a thousand" out of 10, "worst pain ever", writhing in bed, gross hematuria       Objective: Vitals:   07/06/20 1658 07/06/20 2020 07/07/20 0340 07/07/20 0749  BP: 108/68 (!) 95/58 115/63 100/72  Pulse: 77 70 72 84  Resp: 16 20 18 18   Temp: 98.4 F (36.9 C) 97.7 F (36.5 C) 98 F (36.7 C) 97.9 F (36.6 C)  TempSrc: Oral Oral    SpO2: 98% 100% 97% 98%  Weight:      Height:        Intake/Output Summary (Last 24 hours) at 07/07/2020 1417 Last data filed at 07/07/2020 [id number] Gross per 24 hour  Intake 120 ml  Output 1100 ml  Net -980 ml   Filed Weights   07/01/20 2356 07/06/20 1240  Weight: 114.8 kg 114.8 kg    Examination: General appearance: Adult male, appears uncomfortable     HEENT: Anicteric, conjunctival pink, lids and lashes normal.  No nasal deformity, discharge, or epistaxis Skin: Skin warm and dry, numerous tattoos, no rashes or bruises Cardiac: RRR, no murmurs, no lower extremity edema Respiratory: Clear without rales or wheezes, normal respiratory effort Abdomen: Abdomen tender throughout, voluntary guarding, no rigidity or rebound MSK:  Neuro: Awake and alert, extraocular [place],  moves all extremities normal strength and pronation, speech fluent Psych: Attention diminished by pain, affect blunted, judgment severe    Data Reviewed: I have personally reviewed following labs and imaging studies:  CBC: Recent Labs  Lab 07/02/20 0001 07/03/20 0521 07/06/20 0412  WBC 13.3* 9.1 12.7*  HGB 13.8 13.9 13.3  HCT 41.2 40.7 40.1  MCV 85.1 85.1 86.8  PLT 309 326 301   Basic Metabolic Panel: Recent Labs  Lab 07/02/20 0001 07/03/20 0521 07/06/20 0412  NA 136 134* 139  K 3.8 4.3 4.0  CL 103 104 103  CO2 24 24 29   GLUCOSE 111* 157* 116*  BUN 12 8 10   CREATININE 0.99 0.78 0.95  CALCIUM 9.0 8.9 8.9   GFR: Estimated Creatinine Clearance: 146.5 mL/min (by C-G formula based on SCr of  0.95 mg/dL). Liver Function Tests: Recent Labs  Lab 07/06/20 0412  AST 19  ALT 22  ALKPHOS 71  BILITOT 0.4  PROT 6.5  ALBUMIN 3.0*   No results for input(s): LIPASE, AMYLASE in the last 168 hours. No results for input(s): AMMONIA in the last 168 hours. Coagulation Profile: Recent Labs  Lab 07/02/20 0800  INR 1.1   Cardiac Enzymes: No results for input(s): CKTOTAL, CKMB, CKMBINDEX, TROPONINI in the last 168 hours. BNP (last 3 results) No results for input(s): PROBNP in the last 8760 hours. HbA1C: No results for input(s): HGBA1C in the last 72 hours. CBG: Recent Labs  Lab 07/04/20 0843 07/06/20 0730 07/06/20 1127 07/07/20 0802  GLUCAP 110* 94 91 93   Lipid Profile: No results for input(s): CHOL, HDL, LDLCALC, TRIG, CHOLHDL, LDLDIRECT in the last 72 hours. Thyroid Function Tests: No results for input(s): TSH, T4TOTAL, FREET4, T3FREE, THYROIDAB in the last 72 hours. Anemia Panel: No results for input(s): VITAMINB12, FOLATE, FERRITIN, TIBC, IRON, RETICCTPCT in the last 72 hours. Urine analysis:    Component Value Date/Time   COLORURINE YELLOW (A) 07/02/2020 0001   APPEARANCEUR HAZY (A) 07/02/2020 0001   APPEARANCEUR Cloudy (A) 06/15/2020 1411   LABSPEC 1.013 07/02/2020 0001   PHURINE 6.0 07/02/2020 0001   GLUCOSEU NEGATIVE 07/02/2020 0001   HGBUR LARGE (A) 07/02/2020 0001   BILIRUBINUR NEGATIVE 07/02/2020 0001   BILIRUBINUR Negative 06/15/2020 1411   KETONESUR NEGATIVE 07/02/2020 0001   PROTEINUR NEGATIVE 07/02/2020 0001   UROBILINOGEN 1.0 11/28/2014 1026   NITRITE NEGATIVE 07/02/2020 0001   LEUKOCYTESUR SMALL (A) 07/02/2020 0001   Sepsis Labs: @LABRCNTIP (procalcitonin:4,lacticacidven:4)  ) Recent Results (from the past 240 hour(s))  Urine Culture     Status: None   Collection Time: 07/02/20 12:01 AM   Specimen: Urine, Clean Catch  Result Value Ref Range Status   Specimen Description   Final    URINE, CLEAN CATCH Performed at [facility],  [address] [place], [facility] [address]    Special Requests   Final    NONE Performed at [facility], [address] [place], [facility] [address]    Culture   Final    NO GROWTH Performed at [facility], [address], [place], [facility] [address]    Report Status 07/03/2020 FINAL  Final  Resp Panel by RT-PCR (Flu A&B, Covid) Nasopharyngeal Swab     Status: None   Collection Time: 07/02/20  5:41 AM   Specimen: Nasopharyngeal Swab; Nasopharyngeal(NP) swabs in vial transport medium  Result Value Ref Range Status   SARS Coronavirus 2 by RT PCR NEGATIVE NEGATIVE Final    Comment: (NOTE) SARS-CoV-2 target nucleic  acids are NOT DETECTED.  The SARS-CoV-2 RNA is generally detectable in upper respiratory specimens during the acute phase of infection. The lowest concentration of SARS-CoV-2 viral copies this assay can detect is 138 copies/mL. A negative result does not preclude SARS-Cov-2 infection and should not be used as the sole basis for treatment or other patient management decisions. A negative result may occur with  improper specimen collection/handling, submission of specimen other than nasopharyngeal swab, presence of viral mutation(s) within the areas targeted by this assay, and inadequate number of viral copies(<138 copies/mL). A negative result must be combined with clinical observations, patient history, and epidemiological information. The expected result is Negative.  Fact Sheet for Patients:  BloggerCourse.com  Fact Sheet for Healthcare Providers:  SeriousBroker.it  This test is no t yet approved or cleared by the [place] FDA and  has been authorized for detection and/or diagnosis of SARS-CoV-2 by FDA under an Emergency Use Authorization (EUA). This EUA will remain  in effect (meaning this test can be used) for the duration of the COVID-19 declaration under Section 564(b)(1) of the Act,  21 U.S.C.section 360bbb-3(b)(1), unless the authorization is terminated  or revoked sooner.       Influenza A by PCR NEGATIVE NEGATIVE Final   Influenza B by PCR NEGATIVE NEGATIVE Final    Comment: (NOTE) The Xpert Xpress SARS-CoV-2/FLU/RSV plus assay is intended as an aid in the diagnosis of influenza from Nasopharyngeal swab specimens and should not be used as a sole basis for treatment. Nasal washings and aspirates are unacceptable for Xpert Xpress SARS-CoV-2/FLU/RSV testing.  Fact Sheet for Patients: BloggerCourse.com  Fact Sheet for Healthcare Providers: SeriousBroker.it  This test is not yet approved or cleared by the [place] FDA and has been authorized for detection and/or diagnosis of SARS-CoV-2 by FDA under an Emergency Use Authorization (EUA). This EUA will remain in effect (meaning this test can be used) for the duration of the COVID-19 declaration under Section 564(b)(1) of the Act, 21 U.S.C. section 360bbb-3(b)(1), unless the authorization is terminated or revoked.  Performed at [facility], [address] [place], [place] [address]   Culture, blood (x 2)     Status: None   Collection Time: 07/02/20  8:00 AM   Specimen: BLOOD  Result Value Ref Range Status   Specimen Description BLOOD RIGHT ANTECUBITAL  Final   Special Requests   Final    BOTTLES DRAWN AEROBIC AND ANAEROBIC Blood Culture adequate volume   Culture   Final    NO GROWTH 5 DAYS Performed at [facility], [address], [place], [place] [address]    Report Status 07/07/2020 FINAL  Final  Culture, blood (x 2)     Status: None   Collection Time: 07/02/20  8:00 AM   Specimen: BLOOD  Result Value Ref Range Status   Specimen Description BLOOD LEFT ANTECUBITAL  Final   Special Requests   Final    BOTTLES DRAWN AEROBIC AND ANAEROBIC Blood Culture adequate volume   Culture   Final    NO GROWTH 5 DAYS Performed at  [facility], [address] [place], [place] [address]    Report Status 07/07/2020 FINAL  Final  Urine Culture     Status: None   Collection Time: 07/02/20  4:26 PM   Specimen: Kidney; Urine  Result Value Ref Range Status   Specimen Description   Final    KIDNEY left kidney Performed at [facility], [address], [place], [place]  [address]    Special Requests   Final    clindamycin, rocephin Performed at [facility], [address], [place], [place] [address]    Culture   Final    NO GROWTH Performed at [facility], [address], [place], [place] [address]    Report Status 07/03/2020 FINAL  Final         Radiology Studies: CT ABDOMEN PELVIS W CONTRAST  Result Date: 07/06/2020 CLINICAL DATA:  Acute onset right flank pain, history of renal calculi and left ureteral stent placement EXAM: CT ABDOMEN AND PELVIS WITH CONTRAST TECHNIQUE: Multidetector CT imaging of the abdomen and pelvis was performed using the standard protocol following bolus administration of intravenous contrast. CONTRAST:  OMNIPAQUE IOHEXOL 300 MG/ML  SOLN COMPARISON:  07/02/2020, 07/06/2020 FINDINGS: Lower chest: Patchy airspace disease within the lingula could reflect infection or aspiration. No effusion or pneumothorax. Hepatobiliary: No focal liver abnormality is seen. No gallstones, gallbladder wall thickening, or biliary dilatation. Pancreas: Unremarkable. No pancreatic ductal dilatation or surrounding inflammatory changes. Spleen: Normal in size without focal abnormality. Adrenals/Urinary Tract: There has been interval placement of a left ureteral stent, extending from the left renal pelvis to the bladder lumen. The left-sided obstructive uropathy seen previously has resolved in the interim. Left UPJ calculus on prior study has been removed. Fragmented calculi are seen within a lower pole calyx, measuring less than 3 mm. The left perirenal hematoma on prior study is  unchanged measuring 11 mm in thickness, consistent with known history of recent lithotripsy. The recently placed right ureteral stent has migrated into the bladder lumen. There has been interval development of right-sided obstructive uropathy, though I do not see any residual calculus within the right ureter. Peri ureteral fat stranding surrounding the proximal right ureter. Gas within the right renal collecting system consistent with recent cystoscopy. Minimal excretion of contrast noted in the right kidney on delayed images. Adrenals are unremarkable. Minimal gas within the bladder lumen consistent with recent cystoscopy. No filling defects. Stomach/Bowel: No bowel obstruction or ileus. Normal appendix right lower quadrant. No bowel wall thickening or inflammatory change. Vascular/Lymphatic: No significant vascular findings are present. No enlarged abdominal or pelvic lymph nodes. Reproductive: Prostate is unremarkable. Other: No free fluid or free gas.  No abdominal wall hernia. Musculoskeletal: No acute or destructive bony lesions. Reconstructed images demonstrate no additional findings. IMPRESSION: 1. Interval development of right-sided obstructive uropathy, with no residual right-sided calculus identified. The right ureteral stent placed earlier today has migrated into the bladder lumen. 2. Stable position of the left ureteral stent, with resolution of obstructive uropathy seen previously. Residual calculi lower pole left kidney, with stable left perirenal hematoma compatible with recent lithotripsy. 3. Minimal gas within the bladder lumen and right renal calices consistent with recent cystoscopy and ureteroscopy. 4. Patchy lingular airspace disease, which could reflect infection or aspiration. Electronically Signed   By: Sharlet Salina M.D.   On: 07/06/2020 20:07   DG OR UROLOGY CYSTO IMAGE ([facility] ONLY)  Result Date: 07/06/2020 There is no interpretation for this exam.  This order is for images obtained  during a surgical procedure.  Please See "Surgeries" Tab for more information regarding the procedure.        Scheduled Meds: . acetaminophen  500 mg Oral TID  . doxycycline  100 mg Oral Q12H  . ketorolac  30 mg Intravenous Q8H  . melatonin  3 mg Oral QHS  . oxybutynin  5 mg Oral TID  . tamsulosin  0.8 mg Oral Daily  . traZODone  50 mg Oral QHS   Continuous Infusions: . sodium chloride Stopped (07/03/20 0619)     LOS: 5 days    Time spent: 35 minutes    Alberteen Sam, MD [facility] 07/07/2020, 2:17 PM     Please page though AMION or Epic secure chat:  For [employer], Higher education careers adviser

## 2020-07-08 ENCOUNTER — Emergency Department: Payer: BC Managed Care – PPO

## 2020-07-08 ENCOUNTER — Encounter: Payer: Self-pay | Admitting: Internal Medicine

## 2020-07-08 ENCOUNTER — Observation Stay
Admission: EM | Admit: 2020-07-08 | Discharge: 2020-07-10 | Disposition: A | Discharge status: Home / Self Care | Payer: BC Managed Care – PPO | Attending: Internal Medicine | Admitting: Internal Medicine

## 2020-07-08 DIAGNOSIS — R109 Unspecified abdominal pain: Secondary | ICD-10-CM | POA: Diagnosis present

## 2020-07-08 DIAGNOSIS — N200 Calculus of kidney: Secondary | ICD-10-CM

## 2020-07-08 DIAGNOSIS — R10A1 Flank pain, right side: Secondary | ICD-10-CM | POA: Diagnosis present

## 2020-07-08 LAB — URINALYSIS, COMPLETE (UACMP) WITH MICROSCOPIC
Bilirubin Urine: NEGATIVE
Glucose, UA: NEGATIVE mg/dL
Ketones, ur: NEGATIVE mg/dL
Nitrite: NEGATIVE
Protein, ur: 100 mg/dL — AB
RBC / HPF: >50 RBC/hpf — ABNORMAL HIGH (ref 0–5)
Specific Gravity, Urine: 1.009 (ref 1.005–1.030)
Squamous Epithelial / HPF: NONE SEEN (ref 0–5)
pH: 9 — ABNORMAL HIGH (ref 5.0–8.0)

## 2020-07-08 LAB — BASIC METABOLIC PANEL
Anion gap: 9 (ref 5–15)
BUN: 10 mg/dL (ref 6–20)
CO2: 23 mmol/L (ref 22–32)
Calcium: 8.5 mg/dL — ABNORMAL LOW (ref 8.9–10.3)
Chloride: 104 mmol/L (ref 98–111)
Creatinine, Ser: 1.05 mg/dL (ref 0.61–1.24)
GFR, Estimated: >60 mL/min (ref >60)
Glucose, Bld: 127 mg/dL — ABNORMAL HIGH (ref 70–99)
Potassium: 3.5 mmol/L (ref 3.5–5.1)
Sodium: 136 mmol/L (ref 135–145)

## 2020-07-08 LAB — CBC
HCT: 41 % (ref 39.0–52.0)
Hemoglobin: 13.6 g/dL (ref 13.0–17.0)
MCH: 28.8 pg (ref 26.0–34.0)
MCHC: 33.2 g/dL (ref 30.0–36.0)
MCV: 86.9 fL (ref 80.0–100.0)
Platelets: 303 10*3/uL (ref 150–400)
RBC: 4.72 MIL/uL (ref 4.22–5.81)
RDW: 14 % (ref 11.5–15.5)
WBC: 11.2 10*3/uL — ABNORMAL HIGH (ref 4.0–10.5)
nRBC: 0 % (ref 0.0–0.2)

## 2020-07-08 LAB — LACTIC ACID, PLASMA: Lactic Acid, Venous: 0.8 mmol/L (ref 0.5–1.9)

## 2020-07-08 MED ORDER — OXYCODONE HCL 5 MG PO TABS
10.0000 mg | ORAL_TABLET | Freq: Two times a day (BID) | ORAL | Status: DC | PRN
  Administered 2020-07-08 – 2020-07-10 (×2): 10 mg via ORAL
  Filled 2020-07-08 (×2): qty 2

## 2020-07-08 MED ORDER — SODIUM CHLORIDE 0.9 % IV SOLN: INTRAVENOUS | Status: DC

## 2020-07-08 MED ORDER — HYDROMORPHONE HCL 1 MG/ML IJ SOLN
1.0000 mg | Freq: Once | INTRAMUSCULAR | Status: AC
  Administered 2020-07-08: 1 mg via INTRAVENOUS
  Filled 2020-07-08: qty 1

## 2020-07-08 MED ORDER — ONDANSETRON HCL 4 MG/2ML IJ SOLN: 4.0000 mg | Freq: Four times a day (QID) | INTRAMUSCULAR | Status: DC | PRN

## 2020-07-08 MED ORDER — KETOROLAC TROMETHAMINE 30 MG/ML IJ SOLN
30.0000 mg | Freq: Once | INTRAMUSCULAR | Status: AC
  Administered 2020-07-08: 30 mg via INTRAVENOUS
  Filled 2020-07-08: qty 1

## 2020-07-08 MED ORDER — ACETAMINOPHEN 500 MG PO TABS: 1000.0000 mg | ORAL_TABLET | Freq: Three times a day (TID) | ORAL | Status: DC | PRN

## 2020-07-08 MED ORDER — HYDROMORPHONE HCL 1 MG/ML IJ SOLN
1.0000 mg | Freq: Once | INTRAMUSCULAR | Status: AC
Start: 2020-07-08 — End: 2020-07-08
  Administered 2020-07-08: 1 mg via INTRAVENOUS
  Filled 2020-07-08: qty 1

## 2020-07-08 MED ORDER — SODIUM CHLORIDE 0.9 % IV BOLUS
1000.0000 mL | Freq: Once | INTRAVENOUS | Status: AC
  Administered 2020-07-08: 1000 mL via INTRAVENOUS

## 2020-07-08 MED ORDER — MORPHINE SULFATE (PF) 2 MG/ML IV SOLN
2.0000 mg | INTRAVENOUS | Status: DC | PRN
  Administered 2020-07-08 – 2020-07-09 (×5): 2 mg via INTRAVENOUS
  Filled 2020-07-08 (×5): qty 1

## 2020-07-08 MED ORDER — TRAZODONE HCL 50 MG PO TABS
25.0000 mg | ORAL_TABLET | Freq: Every evening | ORAL | Status: DC | PRN
  Administered 2020-07-08 – 2020-07-10 (×2): 50 mg via ORAL
  Filled 2020-07-08 (×2): qty 1

## 2020-07-08 MED ORDER — KETOROLAC TROMETHAMINE 30 MG/ML IJ SOLN: 30.0000 mg | Freq: Once | INTRAMUSCULAR | Status: DC

## 2020-07-08 MED ORDER — ONDANSETRON HCL 4 MG PO TABS: 4.0000 mg | ORAL_TABLET | Freq: Four times a day (QID) | ORAL | Status: DC | PRN

## 2020-07-08 MED ORDER — TAMSULOSIN HCL 0.4 MG PO CAPS
0.4000 mg | ORAL_CAPSULE | Freq: Every day | ORAL | Status: DC
  Administered 2020-07-08 – 2020-07-10 (×3): 0.4 mg via ORAL
  Filled 2020-07-08 (×3): qty 1

## 2020-07-08 NOTE — Anesthesia Postprocedure Evaluation (Signed)
Anesthesia Post Note  Patient: Jacob Nixon.  Procedure(s) Performed: CYSTOSCOPY/URETEROSCOPY/HOLMIUM LASER/LEFT STENT EXCHANGE, RIGHT STENT PLACEMENT (Bilateral )  Patient location during evaluation: PACU Anesthesia Type: General Level of consciousness: awake and alert Pain management: pain level controlled Vital Signs Assessment: post-procedure vital signs reviewed and stable Respiratory status: spontaneous breathing, nonlabored ventilation and respiratory function stable Cardiovascular status: blood pressure returned to baseline and stable Postop Assessment: no apparent nausea or vomiting Anesthetic complications: no   No complications documented.   Last Vitals:  Vitals:   07/07/20 0340 07/07/20 0749  BP: 115/63 100/72  Pulse: 72 84  Resp: 18 18  Temp: 36.7 C 36.6 C  SpO2: 97% 98%    Last Pain:  Vitals:   07/07/20 0849  TempSrc:   PainSc: Asleep                 Christia Reading

## 2020-07-08 NOTE — ED Notes (Signed)
Pt reports he "is alright" when asked about pain; specified 5/10 when this RN inquired of him. Room temp adjusted for pt. Given another warm blanket. Bed locked low. Rail up. Call bell within reach. Pt frustrated that his diet order is liquid currently. Educated; pt remains irritated but cooperative. Resp reg/unlabored. Skin dry. Denies any other needs currently.

## 2020-07-08 NOTE — ED Notes (Signed)
Patient given sprite per request. 

## 2020-07-08 NOTE — H&P (Signed)
History and Physical    Jacob Nixon. LOV:[id number] DOB: [birth year] DOA: 07/08/2020  PCP: Patient, No Pcp Per   Patient coming from: Home  I have personally briefly reviewed patient's old medical records in [facility]  Chief Complaint: Right flank pain  HPI: Jacob Nixon. is a 30 y.o. male with medical history significant for nephrolithiasis status post recent lithotripsy and bilateral ureteral stent exchange for nephrolithiasis who returns to the emergency room couple of hours post discharge for evaluation of worsening right flank pain.  He rates his pain a 10 x 10 in intensity at its worst.  Pain is nonradiating and is associated with nausea and vomiting.  Patient states that he did have hematuria which he was told to expect but is concerned about passing a lot of clots.  He denies having any fever or chills.  He received multiple doses of hydromorphone in the ER with improvement in his pain but not complete resolution and continues to rate his pain a 7 x 10 in intensity at its worst.  He complains of dysuria but denies having any frequency. He denies having any chest pain, no shortness of breath, no palpitations, no diaphoresis, no dizziness, no headache, no diarrhea, no constipation, no fever, no chills, no cough, no sore throat, no mental status changes or any focal deficits. Labs show sodium 136, potassium 3.5, chloride 104, bicarb 23, glucose 127, BUN 10, creatinine 1.05, calcium 8.5, lactic acid 0.8, white count 11.2, hemoglobin 13.6, hematocrit 41.0, MCV 86.9, RDW 14, platelet count 303 Urinalysis shows some pyuria Renal stone CT shows left-sided nephrolithiasis without obstructive uropathy. Mild right perinephric stranding.   ED Course: Patient is a 30 year old African-American male with a history of nephrolithiasis who presents to the ER for evaluation of worsening right flank pain.  He is status post recent lithotripsy as well as bilateral ureteral stent exchange.   Patient received multiple doses of hydromorphone in the ER without any significant improvement in his pain and will be referred to observation status for further evaluation.  Urology has been consulted.    Review of Systems: As per HPI otherwise all other systems reviewed and negative.   Past Medical History:  Diagnosis Date  . Bronchitis   . Cellulitis of right hand - RECURRENT   . Kidney stones     Past Surgical History:  Procedure Laterality Date  . CARPAL TUNNEL RELEASE Left 04/09/2016   Procedure: CARPAL TUNNEL RELEASE;  Surgeon: Cammy Copa, MD;  Location: [facility] OR;  Service: Orthopedics;  Laterality: Left;  . CYSTOSCOPY WITH RETROGRADE PYELOGRAM, URETEROSCOPY AND STENT PLACEMENT Bilateral 07/02/2020   Procedure: CYSTOSCOPY WITH RETROGRADE PYELOGRAM, URETEROSCOPY AND STENT PLACEMENT POSSIBLY BILATERAL;  Surgeon: Jannifer Hick, MD;  Location: [facility] ORS;  Service: Urology;  Laterality: Bilateral;  . CYSTOSCOPY/URETEROSCOPY/HOLMIUM LASER/STENT PLACEMENT Bilateral 07/06/2020   Procedure: CYSTOSCOPY/URETEROSCOPY/HOLMIUM LASER/LEFT STENT EXCHANGE, RIGHT STENT PLACEMENT;  Surgeon: Vanna Scotland, MD;  Location: [facility] ORS;  Service: Urology;  Laterality: Bilateral;  . EXTRACORPOREAL SHOCK WAVE LITHOTRIPSY Left 06/22/2020   Procedure: EXTRACORPOREAL SHOCK WAVE LITHOTRIPSY (ESWL);  Surgeon: Vanna Scotland, MD;  Location: [facility] ORS;  Service: Urology;  Laterality: Left;  . HAND SURGERY    . KIDNEY STONE SURGERY     lithortripsy     reports that he has quit smoking. His smoking use included cigarettes. He has never used smokeless tobacco. He reports current alcohol use. He reports that he does not use drugs.  Allergies  Allergen Reactions  .  Coconut Flavor     Scratchy throat  . Penicillins     DID THE REACTION INVOLVE: Swelling of the face/tongue/throat, SOB, or low BP? Y Sudden or severe rash/hives, skin peeling, or the inside of the mouth or nose? N  Did it require medical  treatment? Y When did it last happen?January 2020 If all above answers are "NO", may proceed with cephalosporin use.     Family History  Problem Relation Age of Onset  . Hypertension Other   . Diabetes Father   . Healthy Mother       Prior to Admission medications   Medication Sig Start Date End Date Taking? Authorizing Provider  acetaminophen (TYLENOL) 500 MG tablet Take 2 tablets (1,000 mg total) by mouth every 8 (eight) hours as needed for mild pain, moderate pain, fever or headache. 06/09/20 06/09/21  Don Perking, [place], MD  acetaZOLAMIDE (DIAMOX) 125 MG tablet Take 1 tablet (125 mg total) by mouth 2 (two) times daily. 06/02/19   Judi Saa, DO  doxycycline (VIBRA-TABS) 100 MG tablet Take 1 tablet (100 mg total) by mouth every 12 (twelve) hours. 07/06/20   Danford, Earl Lites, MD  naproxen (EC NAPROSYN) 500 MG EC tablet Take 1 tablet (500 mg total) by mouth 2 (two) times daily as needed. 07/06/20 08/05/20  Danford, Earl Lites, MD  ondansetron (ZOFRAN ODT) 4 MG disintegrating tablet Take 1 tablet (4 mg total) by mouth every 8 (eight) hours as needed. 07/02/20   Nita Sickle, MD  Oxycodone HCl 10 MG TABS Take 1 tablet (10 mg total) by mouth every 12 (twelve) hours as needed for up to 5 days. 07/07/20 07/12/20  Alberteen Sam, MD  tamsulosin (FLOMAX) 0.4 MG CAPS capsule Take 1 capsule (0.4 mg total) by mouth daily for 7 days. 07/02/20 07/09/20  Nita Sickle, MD  traZODone (DESYREL) 50 MG tablet Take 0.5-1 tablets (25-50 mg total) by mouth at bedtime as needed for sleep. 06/02/19   Judi Saa, DO    Physical Exam: Vitals:   07/07/20 2339 07/08/20 0409 07/08/20 0630 07/08/20 0654  BP: (!) 121/95 (!) 160/126 111/62   Pulse: 75 89 92   Resp: (!) Temp: 99.2 F (37.3 C)   97.7 F (36.5 C)  TempSrc: Oral   Oral  SpO2: 99% 97% 95%   Weight:      Height:         Vitals:   07/07/20 2339 07/08/20 0409 07/08/20 0630 07/08/20 0654  BP: (!)  121/95 (!) 160/126 111/62   Pulse: 75 89 92   Resp: (!) Temp: 99.2 F (37.3 C)   97.7 F (36.5 C)  TempSrc: Oral   Oral  SpO2: 99% 97% 95%   Weight:      Height:          Constitutional: Alert and oriented x 3 . Not in any apparent distress HEENT:      Head: Normocephalic and atraumatic.         Eyes: PERLA, EOMI, Conjunctivae are normal. Sclera is non-icteric.       Mouth/Throat: Mucous membranes are moist.       Neck: Supple with no signs of meningismus. Cardiovascular: Regular rate and rhythm. No murmurs, gallops, or rubs. 2+ symmetrical distal pulses are present . No JVD. No LE edema Respiratory: Respiratory effort normal .Lungs sounds clear bilaterally. No wheezes, crackles, or rhonchi.  Gastrointestinal: Soft,  tender right flank, and non distended with  positive bowel sounds.  Genitourinary:  Right CVA tenderness. Musculoskeletal: Nontender with normal range of motion in all extremities. No cyanosis, or erythema of extremities. Neurologic:  Face is symmetric. Moving all extremities. No gross focal neurologic deficits  Skin: Skin is warm, dry.  No rash or ulcers Psychiatric: Mood and affect are normal   Labs on Admission: I have personally reviewed following labs and imaging studies  CBC: Recent Labs  Lab 07/02/20 0001 07/03/20 0521 07/06/20 0412 07/07/20 2342  WBC 13.3* 9.1 12.7* 11.2*  HGB 13.8 13.9 13.3 13.6  HCT 41.2 40.7 40.1 41.0  MCV 85.1 85.1 86.8 86.9  PLT 309 326 301 303   Basic Metabolic Panel: Recent Labs  Lab 07/02/20 0001 07/03/20 0521 07/06/20 0412 07/07/20 2342  NA 136 134* 139 136  K 3.8 4.3 4.0 3.5  CL 103 104 103 104  CO2 24 24 29 23   GLUCOSE 111* 157* 116* 127*  BUN 12 8 10 10   CREATININE 0.99 0.78 0.95 1.05  CALCIUM 9.0 8.9 8.9 8.5*   GFR: Estimated Creatinine Clearance: 134.3 mL/min (by C-G formula based on SCr of 1.05 mg/dL). Liver Function Tests: Recent Labs  Lab 07/06/20 0412  AST 19  ALT 22  ALKPHOS 71   BILITOT 0.4  PROT 6.5  ALBUMIN 3.0*   No results for input(s): LIPASE, AMYLASE in the last 168 hours. No results for input(s): AMMONIA in the last 168 hours. Coagulation Profile: Recent Labs  Lab 07/02/20 0800  INR 1.1   Cardiac Enzymes: No results for input(s): CKTOTAL, CKMB, CKMBINDEX, TROPONINI in the last 168 hours. BNP (last 3 results) No results for input(s): PROBNP in the last 8760 hours. HbA1C: No results for input(s): HGBA1C in the last 72 hours. CBG: Recent Labs  Lab 07/04/20 0843 07/06/20 0730 07/06/20 1127 07/07/20 0802  GLUCAP 110* 94 91 93   Lipid Profile: No results for input(s): CHOL, HDL, LDLCALC, TRIG, CHOLHDL, LDLDIRECT in the last 72 hours. Thyroid Function Tests: No results for input(s): TSH, T4TOTAL, FREET4, T3FREE, THYROIDAB in the last 72 hours. Anemia Panel: No results for input(s): VITAMINB12, FOLATE, FERRITIN, TIBC, IRON, RETICCTPCT in the last 72 hours. Urine analysis:    Component Value Date/Time   COLORURINE AMBER (A) 07/07/2020 2342   APPEARANCEUR CLOUDY (A) 07/07/2020 2342   APPEARANCEUR Cloudy (A) 06/15/2020 1411   LABSPEC 1.009 07/07/2020 2342   PHURINE 9.0 (H) 07/07/2020 2342   GLUCOSEU NEGATIVE 07/07/2020 2342   HGBUR LARGE (A) 07/07/2020 2342   BILIRUBINUR NEGATIVE 07/07/2020 2342   BILIRUBINUR Negative 06/15/2020 1411   KETONESUR NEGATIVE 07/07/2020 2342   PROTEINUR 100 (A) 07/07/2020 2342   UROBILINOGEN 1.0 11/28/2014 1026   NITRITE NEGATIVE 07/07/2020 2342   LEUKOCYTESUR TRACE (A) 07/07/2020 2342    Radiological Exams on Admission: CT ABDOMEN PELVIS W CONTRAST  Result Date: 07/06/2020 CLINICAL DATA:  Acute onset right flank pain, history of renal calculi and left ureteral stent placement EXAM: CT ABDOMEN AND PELVIS WITH CONTRAST TECHNIQUE: Multidetector CT imaging of the abdomen and pelvis was performed using the standard protocol following bolus administration of intravenous contrast. CONTRAST:  100mL OMNIPAQUE IOHEXOL  300 MG/ML  SOLN COMPARISON:  07/02/2020, 07/06/2020 FINDINGS: Lower chest: Patchy airspace disease within the lingula could reflect infection or aspiration. No effusion or pneumothorax. Hepatobiliary: No focal liver abnormality is seen. No gallstones, gallbladder wall thickening, or biliary dilatation. Pancreas: Unremarkable. No pancreatic ductal dilatation or surrounding inflammatory changes. Spleen: Normal in size without focal abnormality. Adrenals/Urinary Tract: There has been  interval placement of a left ureteral stent, extending from the left renal pelvis to the bladder lumen. The left-sided obstructive uropathy seen previously has resolved in the interim. Left UPJ calculus on prior study has been removed. Fragmented calculi are seen within a lower pole calyx, measuring less than 3 mm. The left perirenal hematoma on prior study is unchanged measuring 11 mm in thickness, consistent with known history of recent lithotripsy. The recently placed right ureteral stent has migrated into the bladder lumen. There has been interval development of right-sided obstructive uropathy, though I do not see any residual calculus within the right ureter. Peri ureteral fat stranding surrounding the proximal right ureter. Gas within the right renal collecting system consistent with recent cystoscopy. Minimal excretion of contrast noted in the right kidney on delayed images. Adrenals are unremarkable. Minimal gas within the bladder lumen consistent with recent cystoscopy. No filling defects. Stomach/Bowel: No bowel obstruction or ileus. Normal appendix right lower quadrant. No bowel wall thickening or inflammatory change. Vascular/Lymphatic: No significant vascular findings are present. No enlarged abdominal or pelvic lymph nodes. Reproductive: Prostate is unremarkable. Other: No free fluid or free gas.  No abdominal wall hernia. Musculoskeletal: No acute or destructive bony lesions. Reconstructed images demonstrate no additional  findings. IMPRESSION: 1. Interval development of right-sided obstructive uropathy, with no residual right-sided calculus identified. The right ureteral stent placed earlier today has migrated into the bladder lumen. 2. Stable position of the left ureteral stent, with resolution of obstructive uropathy seen previously. Residual calculi lower pole left kidney, with stable left perirenal hematoma compatible with recent lithotripsy. 3. Minimal gas within the bladder lumen and right renal calices consistent with recent cystoscopy and ureteroscopy. 4. Patchy lingular airspace disease, which could reflect infection or aspiration. Electronically Signed   By: Sharlet Salina M.D.   On: 07/06/2020 20:07   DG OR UROLOGY CYSTO IMAGE ([facility] ONLY)  Result Date: 07/06/2020 There is no interpretation for this exam.  This order is for images obtained during a surgical procedure.  Please See "Surgeries" Tab for more information regarding the procedure.   CT Renal Stone Study  Result Date: 07/08/2020 CLINICAL DATA:  Flank pain.  History of kidney stones. EXAM: CT ABDOMEN AND PELVIS WITHOUT CONTRAST TECHNIQUE: Multidetector CT imaging of the abdomen and pelvis was performed following the standard protocol without IV contrast. COMPARISON:  None. FINDINGS: LOWER CHEST: Normal. HEPATOBILIARY: Normal hepatic contours. No intra- or extrahepatic biliary dilatation. The gallbladder is normal. PANCREAS: Normal pancreas. No ductal dilatation or peripancreatic fluid collection. SPLEEN: Normal. ADRENALS/URINARY TRACT: The adrenal glands are normal. No ureterolithiasis. Stone at the lower pole of the left kidney measures 5 mm. There other smaller, more superior stones. There is mild right perinephric stranding. The urinary bladder is normal for degree of distention STOMACH/BOWEL: No hiatal hernia. Normal duodenal course and caliber. no small bowel dilatation or inflammation. No focal colonic abnormality. Normal appendix. VASCULAR/LYMPHATIC:  Normal course and caliber of the major abdominal vessels. No abdominal or pelvic lymphadenopathy. REPRODUCTIVE: Normal prostate size with symmetric seminal vesicles. MUSCULOSKELETAL. No bony spinal canal stenosis or focal osseous abnormality. OTHER: None. IMPRESSION: 1. Left-sided nephrolithiasis without obstructive uropathy. 2. Mild right perinephric stranding. Electronically Signed   By: Deatra Robinson M.D.   On: 07/08/2020 02:33     Assessment/Plan Principal Problem:   Acute right flank pain Active Problems:   Nephrolithiasis    Acute right flank pain Patient has a history of nephrolithiasis and is status post recent left-sided lithotripsy as well as  bilateral ureteral stent exchange He presents for evaluation of right flank pain and has pyuria as well as right perinephric stranding Per urology the CT scan findings are expected as well as the pyuria Pain control IV fluid hydration We will request urology consult   DVT prophylaxis: SCD Code Status: full code Family Communication: Greater than 50% of time was spent discussing patient's condition and plan of care with him at the bedside.  All questions and concerns have been addressed.  He verbalizes understanding and agrees with the plan. Disposition Plan: Back to previous home environment Consults called: Urology Status: Observation    Kashus Karlen MD [facility]     07/08/2020, 9:24 AM

## 2020-07-08 NOTE — H&P (Signed)
Urology Consult  I have been asked to see the patient by Dr. Adria Devon, for evaluation and management of right flank pain.  Chief Complaint: right flank pain  History of Present Illness: Jacob Nixon. is a 30 y.o. year old with male well-known to the urologic service who was discharged yesterday after prolonged admission for obstructing ureteral calculus ultimately underwent bilateral ureteroscopy with laser lithotripsy and stent placement 2 days ago.  His postoperative course was complicated by severe pain and inadvertently premature dislodgment of both of his ureteral stents.  Ultimately, his pain was able to be controlled on postop day 1 and he was discharged home.  He returned to the emergency room approximately 14 hours after discharge with recurrent right flank pain.  He underwent another complete work-up including borderline but improving leukocytosis, normal creatinine, essentially benign noncontrast CT scan including some minimal anticipated right perinephric stranding without hydronephrosis bilaterally.  His urinalysis was fairly bland and consistent with stents removed less than 24 hours ago.  He was otherwise normotensive, afebrile and no other signs or symptoms of infection.  Unfortunately, his pain was not able to be controlled in the emergency room and he was readmitted.  Notably, he had a prolonged admission with severe pain requiring Dilaudid and ultimately PCA and did not tolerate the stent particularly well.  Infection was ruled out on previous admission.  He was on ceftriaxone throughout that admission.  He does have multiple psychological stressors which we had previously discussed including going through divorce, living with his parents, recent severe illness with Covid, etc.  Past Medical History:  Diagnosis Date  . Bronchitis   . Cellulitis of right hand - RECURRENT   . Kidney stones     Past Surgical History:  Procedure Laterality Date  . CARPAL TUNNEL RELEASE  Left 04/09/2016   Procedure: CARPAL TUNNEL RELEASE;  Surgeon: Cammy Copa, MD;  Location: [facility] OR;  Service: Orthopedics;  Laterality: Left;  . CYSTOSCOPY WITH RETROGRADE PYELOGRAM, URETEROSCOPY AND STENT PLACEMENT Bilateral 07/02/2020   Procedure: CYSTOSCOPY WITH RETROGRADE PYELOGRAM, URETEROSCOPY AND STENT PLACEMENT POSSIBLY BILATERAL;  Surgeon: Jannifer Hick, MD;  Location: [facility] ORS;  Service: Urology;  Laterality: Bilateral;  . CYSTOSCOPY/URETEROSCOPY/HOLMIUM LASER/STENT PLACEMENT Bilateral 07/06/2020   Procedure: CYSTOSCOPY/URETEROSCOPY/HOLMIUM LASER/LEFT STENT EXCHANGE, RIGHT STENT PLACEMENT;  Surgeon: Vanna Scotland, MD;  Location: [facility] ORS;  Service: Urology;  Laterality: Bilateral;  . EXTRACORPOREAL SHOCK WAVE LITHOTRIPSY Left 06/22/2020   Procedure: EXTRACORPOREAL SHOCK WAVE LITHOTRIPSY (ESWL);  Surgeon: Vanna Scotland, MD;  Location: [facility] ORS;  Service: Urology;  Laterality: Left;  . HAND SURGERY    . KIDNEY STONE SURGERY     lithortripsy    Home Medications:  Current Meds  Medication Sig  . acetaminophen (TYLENOL) 500 MG tablet Take 2 tablets (1,000 mg total) by mouth every 8 (eight) hours as needed for mild pain, moderate pain, fever or headache.  Marland Kitchen acetaZOLAMIDE (DIAMOX) 125 MG tablet Take 1 tablet (125 mg total) by mouth 2 (two) times daily.  Marland Kitchen doxycycline (VIBRA-TABS) 100 MG tablet Take 1 tablet (100 mg total) by mouth every 12 (twelve) hours.  . naproxen (EC NAPROSYN) 500 MG EC tablet Take 1 tablet (500 mg total) by mouth 2 (two) times daily as needed.  . ondansetron (ZOFRAN ODT) 4 MG disintegrating tablet Take 1 tablet (4 mg total) by mouth every 8 (eight) hours as needed.  . Oxycodone HCl 10 MG TABS Take 1 tablet (10 mg total) by mouth every 12 (twelve) hours  as needed for up to 5 days.  . tamsulosin (FLOMAX) 0.4 MG CAPS capsule Take 1 capsule (0.4 mg total) by mouth daily for 7 days.    Allergies:  Allergies  Allergen Reactions  . Coconut Flavor     Scratchy  throat  . Penicillins     DID THE REACTION INVOLVE: Swelling of the face/tongue/throat, SOB, or low BP? Y Sudden or severe rash/hives, skin peeling, or the inside of the mouth or nose? N  Did it require medical treatment? Y When did it last happen?January 2020 If all above answers are "NO", may proceed with cephalosporin use.     Family History  Problem Relation Age of Onset  . Hypertension Other   . Diabetes Father   . Healthy Mother     Social History:  reports that he has quit smoking. His smoking use included cigarettes. He has never used smokeless tobacco. He reports current alcohol use. He reports that he does not use drugs.  ROS: A complete review of systems was performed.  All systems are negative except for pertinent findings as noted.  Physical Exam:  Vital signs in last 24 hours: Temp:  [97.7 F (36.5 C)-99.2 F (37.3 C)] 98 F (36.7 C) (02/19 1602) Pulse Rate:  [60-92] 64 (02/19 1602) Resp:  [16-22] 16 (02/19 1602) BP: (106-160)/(62-126) 121/79 (02/19 1602) SpO2:  [95 %-100 %] 98 % (02/19 1602) Weight:  [117.9 kg] 117.9 kg (02/18 2337) Constitutional:  Alert and oriented, No acute distress.  Appears quite comfortable. HEENT: [place] AT, moist mucus membranes.  Trachea midline, no masses Cardiovascular: Regular rate and rhythm, no clubbing, cyanosis, or edema. Respiratory: Normal respiratory effort, lungs clear bilaterally GI: Abdomen is soft, nontender, nondistended, no abdominal masses GU: No CVA tenderness Neurologic: Grossly intact, no focal deficits, moving all 4 extremities Psychiatric: Normal mood and affect   Laboratory Data:  Recent Labs    07/06/20 0412 07/07/20 2342  WBC 12.7* 11.2*  HGB 13.3 13.6  HCT 40.1 41.0     Radiologic Imaging: CT ABDOMEN PELVIS W CONTRAST  Result Date: 07/06/2020 CLINICAL DATA:  Acute onset right flank pain, history of renal calculi and left ureteral stent placement EXAM: CT ABDOMEN AND PELVIS WITH CONTRAST  TECHNIQUE: Multidetector CT imaging of the abdomen and pelvis was performed using the standard protocol following bolus administration of intravenous contrast. CONTRAST:  OMNIPAQUE IOHEXOL 300 MG/ML  SOLN COMPARISON:  07/02/2020, 07/06/2020 FINDINGS: Lower chest: Patchy airspace disease within the lingula could reflect infection or aspiration. No effusion or pneumothorax. Hepatobiliary: No focal liver abnormality is seen. No gallstones, gallbladder wall thickening, or biliary dilatation. Pancreas: Unremarkable. No pancreatic ductal dilatation or surrounding inflammatory changes. Spleen: Normal in size without focal abnormality. Adrenals/Urinary Tract: There has been interval placement of a left ureteral stent, extending from the left renal pelvis to the bladder lumen. The left-sided obstructive uropathy seen previously has resolved in the interim. Left UPJ calculus on prior study has been removed. Fragmented calculi are seen within a lower pole calyx, measuring less than 3 mm. The left perirenal hematoma on prior study is unchanged measuring 11 mm in thickness, consistent with known history of recent lithotripsy. The recently placed right ureteral stent has migrated into the bladder lumen. There has been interval development of right-sided obstructive uropathy, though I do not see any residual calculus within the right ureter. Peri ureteral fat stranding surrounding the proximal right ureter. Gas within the right renal collecting system consistent with recent cystoscopy. Minimal excretion of  contrast noted in the right kidney on delayed images. Adrenals are unremarkable. Minimal gas within the bladder lumen consistent with recent cystoscopy. No filling defects. Stomach/Bowel: No bowel obstruction or ileus. Normal appendix right lower quadrant. No bowel wall thickening or inflammatory change. Vascular/Lymphatic: No significant vascular findings are present. No enlarged abdominal or pelvic lymph nodes.  Reproductive: Prostate is unremarkable. Other: No free fluid or free gas.  No abdominal wall hernia. Musculoskeletal: No acute or destructive bony lesions. Reconstructed images demonstrate no additional findings. IMPRESSION: 1. Interval development of right-sided obstructive uropathy, with no residual right-sided calculus identified. The right ureteral stent placed earlier today has migrated into the bladder lumen. 2. Stable position of the left ureteral stent, with resolution of obstructive uropathy seen previously. Residual calculi lower pole left kidney, with stable left perirenal hematoma compatible with recent lithotripsy. 3. Minimal gas within the bladder lumen and right renal calices consistent with recent cystoscopy and ureteroscopy. 4. Patchy lingular airspace disease, which could reflect infection or aspiration. Electronically Signed   By: Sharlet Salina M.D.   On: 07/06/2020 20:07   CT Renal Stone Study  Result Date: 07/08/2020 CLINICAL DATA:  Flank pain.  History of kidney stones. EXAM: CT ABDOMEN AND PELVIS WITHOUT CONTRAST TECHNIQUE: Multidetector CT imaging of the abdomen and pelvis was performed following the standard protocol without IV contrast. COMPARISON:  None. FINDINGS: LOWER CHEST: Normal. HEPATOBILIARY: Normal hepatic contours. No intra- or extrahepatic biliary dilatation. The gallbladder is normal. PANCREAS: Normal pancreas. No ductal dilatation or peripancreatic fluid collection. SPLEEN: Normal. ADRENALS/URINARY TRACT: The adrenal glands are normal. No ureterolithiasis. Stone at the lower pole of the left kidney measures 5 mm. There other smaller, more superior stones. There is mild right perinephric stranding. The urinary bladder is normal for degree of distention STOMACH/BOWEL: No hiatal hernia. Normal duodenal course and caliber. no small bowel dilatation or inflammation. No focal colonic abnormality. Normal appendix. VASCULAR/LYMPHATIC: Normal course and caliber of the major  abdominal vessels. No abdominal or pelvic lymphadenopathy. REPRODUCTIVE: Normal prostate size with symmetric seminal vesicles. MUSCULOSKELETAL. No bony spinal canal stenosis or focal osseous abnormality. OTHER: None. IMPRESSION: 1. Left-sided nephrolithiasis without obstructive uropathy. 2. Mild right perinephric stranding. Electronically Signed   By: Deatra Robinson M.D.   On: 07/08/2020 02:33   Above CT scan was personally reviewed.  Agree with radiologic interpretation.  There is in fact decreased amount of right renal pelvic fullness on the scan is compared to the 1 less than 2 days ago.  There is a less right-sided periureteral stranding but slightly more perinephric stranding which again is anticipated, normal postoperative CT scan findings.  Urinalysis with greater than 50 red blood cells, 20-50 white blood cells, rare bacteria, nitrate negative, otherwise unremarkable.  Impression/ Plan: 30 year old male with readmitted for post op flank pain following bilateral ureteroscopy.  Personally reviewed all CT imaging compared to previous as well as lab work.  There is no evidence of infection, complication, urinary obstruction, or any other clear etiology for his discomfort other than normal postoperative pain which appears to be poorly tolerated.    He has been admitted to the hospital service, supportive care and pain meds as needed.  Recommend trying to avoid narcotics at all possible and try to optimize with agents such as Pyridium, BNO suppository, antianxiolytics and Toradol.  Outpatient follow-up has been arranged.  Urology will sign off.  No planned intervention.  07/08/2020, 4:08 PM  Vanna Scotland,  MD

## 2020-07-08 NOTE — ED Provider Notes (Signed)
[facility] Emergency Department Provider Note ____________________________________________   Event Date/Time   First MD Initiated Contact with Patient 07/08/20 0149     (approximate)  I have reviewed the triage vital signs and the nursing notes.   HISTORY  Chief Complaint Flank Pain    HPI Jacob Nixon. is a 30 y.o. male with PMH as noted below including recent lithotripsy and stenting for bilateral kidney stones who presents with recurrent right flank pain, acute onset around 9:30 PM, severe intensity, and associated with hematuria.  The patient states that when he voided this afternoon initially he was passing clots in the urine although now the urine is just pink and clear.  He reports nausea and vomiting as well as some chills.  Past Medical History:  Diagnosis Date  . Bronchitis   . Cellulitis of right hand - RECURRENT   . Kidney stones     Patient Active Problem List   Diagnosis Date Noted  . Acute unilateral obstructive uropathy 07/02/2020  . Obstructive uropathy 07/02/2020  . Acute pyelonephritis 07/02/2020  . Sepsis (HCC) 07/02/2020  . STD exposure   . Left anterior knee pain 10/13/2019  . Cervical disc disorder with radiculopathy of cervical region 08/23/2019  . Low back pain 08/23/2019  . Nonallopathic lesion of lumbosacral region 08/23/2019  . Nonallopathic lesion of sacral region 08/23/2019  . Nonallopathic lesion of thoracic region 08/23/2019  . Labral tear of shoulder 12/15/2018  . Arthritis of left acromioclavicular joint 07/15/2018  . Impingement syndrome of left shoulder 04/01/2018  . Hyponatremia 10/26/2016  . Hypokalemia 10/26/2016  . Elevated glucose level   . Arm swelling 10/15/2016  . Prediabetes 10/15/2016  . Cellulitis of hand 08/04/2016  . Carpal tunnel syndrome on right 05/08/2016  . Abscess of upper arm   . Cellulitis 01/22/2016  . Leukocytosis 01/22/2016  . Neuropathy 01/22/2016  . Nephrolithiasis  01/22/2016  . Cellulitis of right hand   . Cellulitis of forearm, right 07/30/2015    Past Surgical History:  Procedure Laterality Date  . CARPAL TUNNEL RELEASE Left 04/09/2016   Procedure: CARPAL TUNNEL RELEASE;  Surgeon: Cammy Copa, MD;  Location: [facility] OR;  Service: Orthopedics;  Laterality: Left;  . CYSTOSCOPY WITH RETROGRADE PYELOGRAM, URETEROSCOPY AND STENT PLACEMENT Bilateral 07/02/2020   Procedure: CYSTOSCOPY WITH RETROGRADE PYELOGRAM, URETEROSCOPY AND STENT PLACEMENT POSSIBLY BILATERAL;  Surgeon: Jannifer Hick, MD;  Location: [facility] ORS;  Service: Urology;  Laterality: Bilateral;  . CYSTOSCOPY/URETEROSCOPY/HOLMIUM LASER/STENT PLACEMENT Bilateral 07/06/2020   Procedure: CYSTOSCOPY/URETEROSCOPY/HOLMIUM LASER/LEFT STENT EXCHANGE, RIGHT STENT PLACEMENT;  Surgeon: Vanna Scotland, MD;  Location: [facility] ORS;  Service: Urology;  Laterality: Bilateral;  . EXTRACORPOREAL SHOCK WAVE LITHOTRIPSY Left 06/22/2020   Procedure: EXTRACORPOREAL SHOCK WAVE LITHOTRIPSY (ESWL);  Surgeon: Vanna Scotland, MD;  Location: [facility] ORS;  Service: Urology;  Laterality: Left;  . HAND SURGERY    . KIDNEY STONE SURGERY     lithortripsy    Prior to Admission medications   Medication Sig Start Date End Date Taking? Authorizing Provider  acetaminophen (TYLENOL) 500 MG tablet Take 2 tablets (1,000 mg total) by mouth every 8 (eight) hours as needed for mild pain, moderate pain, fever or headache. 06/09/20 06/09/21  Don Perking, [place], MD  acetaZOLAMIDE (DIAMOX) 125 MG tablet Take 1 tablet (125 mg total) by mouth 2 (two) times daily. 06/02/19   Judi Saa, DO  doxycycline (VIBRA-TABS) 100 MG tablet Take 1 tablet (100 mg total) by mouth every 12 (twelve) hours. 07/06/20   Danford,  Earl Liteshristopher P, MD  naproxen (EC NAPROSYN) 500 MG EC tablet Take 1 tablet (500 mg total) by mouth 2 (two) times daily as needed. 07/06/20 08/05/20  Danford, Earl Liteshristopher P, MD  ondansetron (ZOFRAN ODT) 4 MG disintegrating tablet Take 1 tablet (4  mg total) by mouth every 8 (eight) hours as needed. 07/02/20   Nita SickleVeronese, [place], MD  Oxycodone HCl 10 MG TABS Take 1 tablet (10 mg total) by mouth every 12 (twelve) hours as needed for up to 5 days. 07/07/20 07/12/20  Alberteen Samanford, Christopher P, MD  tamsulosin (FLOMAX) 0.4 MG CAPS capsule Take 1 capsule (0.4 mg total) by mouth daily for 7 days. 07/02/20 07/09/20  Nita SickleVeronese, [place], MD  traZODone (DESYREL) 50 MG tablet Take 0.5-1 tablets (25-50 mg total) by mouth at bedtime as needed for sleep. 06/02/19   Judi SaaSmith, Zachary M, DO    Allergies Coconut flavor and Penicillins  Family History  Problem Relation Age of Onset  . Hypertension Other   . Diabetes Father   . Healthy Mother     Social History Social History   Tobacco Use  . Smoking status: Former Smoker    Types: Cigarettes  . Smokeless tobacco: Never Used  Vaping Use  . Vaping Use: Every day  . Substances: Nicotine, Flavoring  Substance Use Topics  . Alcohol use: Yes  . Drug use: No    Review of Systems  Constitutional: Positive for chills. Eyes: No visual changes. ENT: No sore throat. Cardiovascular: Denies chest pain. Respiratory: Denies shortness of breath. Gastrointestinal: Positive for nausea and vomiting. Genitourinary: Positive for hematuria. Musculoskeletal: Positive for back pain. Skin: Negative for rash. Neurological: Negative for headaches, focal weakness or numbness.   ____________________________________________   PHYSICAL EXAM:  VITAL SIGNS: ED Triage Vitals  Enc Vitals Group     BP 07/07/20 2339 (!) 121/95     Pulse Rate 07/07/20 2339 75     Resp 07/07/20 2339 (!) 22     Temp 07/07/20 2339 99.2 F (37.3 C)     Temp Source 07/07/20 2339 Oral     SpO2 07/07/20 2339 99 %     Weight 07/07/20 2337 260 lb (117.9 kg)     Height 07/07/20 2337 5\' 11"  (1.803 m)     Head Circumference --      Peak Flow --      Pain Score 07/07/20 2339 10     Pain Loc --      Pain Edu? --      Excl. in GC? --      Constitutional: Alert and oriented.  Uncomfortable appearing but in no acute distress. Eyes: Conjunctivae are normal.  Head: Atraumatic. Nose: No congestion/rhinnorhea. Mouth/Throat: Mucous membranes are moist.   Neck: Normal range of motion.  Cardiovascular: Normal rate, regular rhythm.  Good peripheral circulation. Respiratory: Normal respiratory effort.  No retractions. Gastrointestinal: Soft with moderate right flank tenderness.  No distention.  Genitourinary: Right CVA tenderness. Musculoskeletal: Extremities warm and well perfused.  Neurologic:  Normal speech and language. No gross focal neurologic deficits are appreciated.  Skin:  Skin is warm and dry. No rash noted. Psychiatric: Mood and affect are normal. Speech and behavior are normal.  ____________________________________________   LABS (all labs ordered are listed, but only abnormal results are displayed)  Labs Reviewed  URINALYSIS, COMPLETE (UACMP) WITH MICROSCOPIC - Abnormal; Notable for the following components:      Result Value   Color, Urine AMBER (*)    APPearance CLOUDY (*)    pH  9.0 (*)    Hgb urine dipstick LARGE (*)    Protein, ur 100 (*)    Leukocytes,Ua TRACE (*)    RBC / HPF >50 (*)    Bacteria, UA RARE (*)    All other components within normal limits  BASIC METABOLIC PANEL - Abnormal; Notable for the following components:   Glucose, Bld 127 (*)    Calcium 8.5 (*)    All other components within normal limits  CBC - Abnormal; Notable for the following components:   WBC 11.2 (*)    All other components within normal limits  URINE CULTURE  LACTIC ACID, PLASMA   ____________________________________________  EKG   ____________________________________________  RADIOLOGY  CT abdomen/pelvis: Mild right perinephric stranding; left intrarenal stones  ____________________________________________   PROCEDURES  Procedure(s) performed: No  Procedures  Critical Care performed:  No ____________________________________________   INITIAL IMPRESSION / ASSESSMENT AND PLAN / ED COURSE  Pertinent labs & imaging results that were available during my care of the patient were reviewed by me and considered in my medical decision making (see chart for details).  30 year old male with PMH as noted above presents with recurrent severe right flank pain and hematuria.  I reviewed the past medical records in Epic.  The patient was initially seen by urology on 1/27 for a 5 mm stone in the left renal pelvis thought to be causing intermittent obstruction.  He had shockwave lithotripsy on 2/3.  He then presented to the ED for recurrent left flank pain on 2/13 and was admitted for pain control.  He had cystoscopy and left stent placement at that time.  However, he had persistent pain and was taken back to the OR on 2/17 for laser lithotripsy and basket extraction on the left as well as laser lithotripsy and stone extraction on the right with right stent exchange.  Postoperatively he continued to have severe right flank pain.  CT yesterday showed that the right ureteral stent migrated into the bladder and he developed right-sided obstructive uropathy.  The right stent was removed and the left dislodged in the process was also removed.  Subsequently the pain improved and he was discharged yesterday.  On exam currently, the patient is overall relatively well-appearing.  He has a borderline elevated temperature but otherwise normal vital signs.  He has right flank and right CVA tenderness.  Basic labs show a normal creatinine, and improved WBC count from 2 days ago, and urinalysis showing RBCs and some WBCs, nitrate negative.  Differential includes postoperative pain, recurrent obstructive uropathy either due to stone or other etiology, polynephritis, or less likely non-GU related etiology.  We will give analgesia, obtain a repeat CT and reassess.  The patient has expressed a strong preference  for CT without contrast because apparently his pain increased markedly after receiving a contrast CT yesterday.  ----------------------------------------- 4:06 AM on 07/08/2020 -----------------------------------------  CT shows mild perinephric stranding on the right but no other acute abnormality to explain the pain.  It is possible that he could be having pyelonephritis although given his improving WBC count and the urinalysis results (as well as a recent negative urine culture on 2/13 (I think this is less likely.  Lactate is normal.  The patient reported significantly improved pain and appeared comfortable.  My plan will be to observe him over the next few hours and also discuss with urology.  If he has refractory pain he may need admission.  If his symptoms resolve he will likely be appropriate for discharge  home.  ----------------------------------------- 6:37 AM on 07/08/2020 -----------------------------------------  I discussed the case with Dr. Apolinar Junes from urology and went over the CT and lab results with her.  She advises that the CT and urinalysis are consistent with postobstructive findings, and that there is no evidence for pyelonephritis.  She would not recommend treating with an antibiotic.    She advises that there is no recommendation for further urologic intervention at this time, and if the patient required additional pain control he could be admitted to the hospitalist.  The patient has continued to have recurrent relatively severe pain requiring additional analgesia.  The etiology of this persistent pain is unclear although it seems from reading the prior notes and discussing with Dr. Apolinar Junes that the patient had somewhat disproportionate pain throughout much of his hospital stay.  I discussed this with the patient; he feels that his pain is not well controlled enough to go home.  I will discuss with the hospitalist for  admission.  ----------------------------------------- 7:28 AM on 07/08/2020 -----------------------------------------  Case discussed with Dr. Joylene Igo from the hospitalist service.  ____________________________________________   FINAL CLINICAL IMPRESSION(S) / ED DIAGNOSES  Final diagnoses:  Flank pain      NEW MEDICATIONS STARTED DURING THIS VISIT:  New Prescriptions   No medications on file     Note:  This document was prepared using Dragon voice recognition software and may include unintentional dictation errors.   Dionne Bucy, MD 07/08/20 [phone]

## 2020-07-08 NOTE — ED Notes (Signed)
When given option of other PRN pain med pt verbalizes wants to wait to receive morphine in .

## 2020-07-08 NOTE — ED Notes (Signed)
Patient requesting pain medication upon reassessment. EDP informed. No new orders at this time.

## 2020-07-08 NOTE — ED Notes (Signed)
Patient on phone in room, NAD noted. Patient requesting pain medication. Will inform EDP.

## 2020-07-09 DIAGNOSIS — R109 Unspecified abdominal pain: Secondary | ICD-10-CM | POA: Diagnosis not present

## 2020-07-09 LAB — BASIC METABOLIC PANEL
Anion gap: 6 (ref 5–15)
BUN: 8 mg/dL (ref 6–20)
CO2: 24 mmol/L (ref 22–32)
Calcium: 8.6 mg/dL — ABNORMAL LOW (ref 8.9–10.3)
Chloride: 107 mmol/L (ref 98–111)
Creatinine, Ser: 0.78 mg/dL (ref 0.61–1.24)
GFR, Estimated: >60 mL/min (ref >60)
Glucose, Bld: 104 mg/dL — ABNORMAL HIGH (ref 70–99)
Potassium: 4 mmol/L (ref 3.5–5.1)
Sodium: 137 mmol/L (ref 135–145)

## 2020-07-09 LAB — CBC
HCT: 38.5 % — ABNORMAL LOW (ref 39.0–52.0)
Hemoglobin: 13.1 g/dL (ref 13.0–17.0)
MCH: 29.6 pg (ref 26.0–34.0)
MCHC: 34 g/dL (ref 30.0–36.0)
MCV: 87.1 fL (ref 80.0–100.0)
Platelets: 287 10*3/uL (ref 150–400)
RBC: 4.42 MIL/uL (ref 4.22–5.81)
RDW: 14.2 % (ref 11.5–15.5)
WBC: 7.7 10*3/uL (ref 4.0–10.5)
nRBC: 0 % (ref 0.0–0.2)

## 2020-07-09 LAB — URINE CULTURE: Culture: NO GROWTH

## 2020-07-09 MED ORDER — KETOROLAC TROMETHAMINE 15 MG/ML IJ SOLN
15.0000 mg | Freq: Four times a day (QID) | INTRAMUSCULAR | Status: DC | PRN
  Administered 2020-07-09: 15 mg via INTRAVENOUS
  Filled 2020-07-09 (×2): qty 1

## 2020-07-09 MED ORDER — KETOROLAC TROMETHAMINE 15 MG/ML IJ SOLN: 7.5000 mg | Freq: Four times a day (QID) | INTRAMUSCULAR | Status: DC | PRN

## 2020-07-09 MED ORDER — PHENAZOPYRIDINE HCL 100 MG PO TABS
100.0000 mg | ORAL_TABLET | Freq: Three times a day (TID) | ORAL | Status: DC
  Filled 2020-07-09 (×4): qty 1

## 2020-07-09 NOTE — Progress Notes (Signed)
PROGRESS NOTE    Jacob Nixon.  HBZ:[id number] DOB: 31-Oct-1990 DOA: 07/08/2020 PCP: Patient, No Pcp Per    Brief Narrative:  Jacob Nixon. is a 30 y.o. male with medical history significant for nephrolithiasis status post recent lithotripsy and bilateral ureteral stent exchange for nephrolithiasis who returns to the emergency room couple of hours post discharge for evaluation of worsening right flank pain.  He rates his pain a 10 x 10 in intensity at its worst  2/20-pain at times bad and othe times ok. After using bathroom, has more pain. Po intake good. Currently has 7/10 pain  Consultants:   urology  Procedures:   Antimicrobials:       Subjective: As above   Objective: Vitals:   07/08/20 2032 07/09/20 0101 07/09/20 0517 07/09/20 1219  BP: 129/88 114/70 113/81 136/82  Pulse: 87 90 (!) 50 87  Resp: 17 18 16 18   Temp: 98.2 F (36.8 C) 98 F (36.7 C) (!) 97.5 F (36.4 C) 97.9 F (36.6 C)  TempSrc:    Oral  SpO2: 98% 96% 97% 100%  Weight:      Height:        Intake/Output Summary (Last 24 hours) at 07/09/2020 1232 Last data filed at 07/09/2020 1015 Gross per 24 hour  Intake 2266.03 ml  Output 3625 ml  Net -1358.97 ml   Filed Weights   07/07/20 2337  Weight: 117.9 kg    Examination:  General exam: Appears calm and comfortable  Respiratory system: Clear to auscultation. Respiratory effort normal. Cardiovascular system: S1 & S2 heard, RRR. No JVD, murmurs, rubs, gallops or clicks.  Gastrointestinal system: Abdomen is nondistended, soft and nontender. No organomegaly or masses felt. Normal bowel sounds heard. Central nervous system: Alert and oriented. No focal neurological deficits. Extremities: No edema Skin: Warm dry Psychiatry: Judgement and insight appear normal. Mood & affect appropriate.     Data Reviewed: I have personally reviewed following labs and imaging studies  CBC: Recent Labs  Lab 07/03/20 0521 07/06/20 0412 07/07/20 2342  07/09/20 0455  WBC 9.1 12.7* 11.2* 7.7  HGB 13.9 13.3 13.6 13.1  HCT 40.7 40.1 41.0 38.5*  MCV 85.1 86.8 86.9 87.1  PLT 326 301 303 287   Basic Metabolic Panel: Recent Labs  Lab 07/03/20 0521 07/06/20 0412 07/07/20 2342 07/09/20 0455  NA 134* 139 136 137  K 4.3 4.0 3.5 4.0  CL 104 103 104 107  CO2 24 29 23 24   GLUCOSE 157* 116* 127* 104*  BUN 8 10 10 8   CREATININE 0.78 0.95 1.05 0.78  CALCIUM 8.9 8.9 8.5* 8.6*   GFR: Estimated Creatinine Clearance: 176.3 mL/min (by C-G formula based on SCr of 0.78 mg/dL). Liver Function Tests: Recent Labs  Lab 07/06/20 0412  AST 19  ALT 22  ALKPHOS 71  BILITOT 0.4  PROT 6.5  ALBUMIN 3.0*   No results for input(s): LIPASE, AMYLASE in the last 168 hours. No results for input(s): AMMONIA in the last 168 hours. Coagulation Profile: No results for input(s): INR, PROTIME in the last 168 hours. Cardiac Enzymes: No results for input(s): CKTOTAL, CKMB, CKMBINDEX, TROPONINI in the last 168 hours. BNP (last 3 results) No results for input(s): PROBNP in the last 8760 hours. HbA1C: No results for input(s): HGBA1C in the last 72 hours. CBG: Recent Labs  Lab 07/04/20 0843 07/06/20 0730 07/06/20 1127 07/07/20 0802  GLUCAP 110* 94 91 93   Lipid Profile: No results for input(s): CHOL, HDL, LDLCALC, TRIG,  CHOLHDL, LDLDIRECT in the last 72 hours. Thyroid Function Tests: No results for input(s): TSH, T4TOTAL, FREET4, T3FREE, THYROIDAB in the last 72 hours. Anemia Panel: No results for input(s): VITAMINB12, FOLATE, FERRITIN, TIBC, IRON, RETICCTPCT in the last 72 hours. Sepsis Labs: Recent Labs  Lab 07/08/20 0300  LATICACIDVEN 0.8    Recent Results (from the past 240 hour(s))  Urine Culture     Status: None   Collection Time: 07/02/20 12:01 AM   Specimen: Urine, Clean Catch  Result Value Ref Range Status   Specimen Description   Final    URINE, CLEAN CATCH Performed at [facility], [address] [place], [place]  [address]    Special Requests   Final    NONE Performed at [facility], [address] [place], [place] [address]    Culture   Final    NO GROWTH Performed at [facility], [address] [place], [place] [address]    Report Status 07/03/2020 FINAL  Final  Resp Panel by RT-PCR (Flu A&B, Covid) Nasopharyngeal Swab     Status: None   Collection Time: 07/02/20  5:41 AM   Specimen: Nasopharyngeal Swab; Nasopharyngeal(NP) swabs in vial transport medium  Result Value Ref Range Status   SARS Coronavirus 2 by RT PCR NEGATIVE NEGATIVE Final    Comment: (NOTE) SARS-CoV-2 target nucleic acids are NOT DETECTED.  The SARS-CoV-2 RNA is generally detectable in upper respiratory specimens during the acute phase of infection. The lowest concentration of SARS-CoV-2 viral copies this assay can detect is 138 copies/mL. A negative result does not preclude SARS-Cov-2 infection and should not be used as the sole basis for treatment or other patient management decisions. A negative result may occur with  improper specimen collection/handling, submission of specimen other than nasopharyngeal swab, presence of viral mutation(s) within the areas targeted by this assay, and inadequate number of viral copies(<138 copies/mL). A negative result must be combined with clinical observations, patient history, and epidemiological information. The expected result is Negative.  Fact Sheet for Patients:  BloggerCourse.comhttps://www.fda.gov/media/152166/download  Fact Sheet for Healthcare Providers:  SeriousBroker.ithttps://www.fda.gov/media/152162/download  This test is no t yet approved or cleared by the [place] FDA and  has been authorized for detection and/or diagnosis of SARS-CoV-2 by FDA under an Emergency Use Authorization (EUA). This EUA will remain  in effect (meaning this test can be used) for the duration of the COVID-19 declaration under Section 564(b)(1) of the Act, 21 U.S.C.section 360bbb-3(b)(1), unless the  authorization is terminated  or revoked sooner.       Influenza A by PCR NEGATIVE NEGATIVE Final   Influenza B by PCR NEGATIVE NEGATIVE Final    Comment: (NOTE) The Xpert Xpress SARS-CoV-2/FLU/RSV plus assay is intended as an aid in the diagnosis of influenza from Nasopharyngeal swab specimens and should not be used as a sole basis for treatment. Nasal washings and aspirates are unacceptable for Xpert Xpress SARS-CoV-2/FLU/RSV testing.  Fact Sheet for Patients: BloggerCourse.comhttps://www.fda.gov/media/152166/download  Fact Sheet for Healthcare Providers: SeriousBroker.ithttps://www.fda.gov/media/152162/download  This test is not yet approved or cleared by the [place] FDA and has been authorized for detection and/or diagnosis of SARS-CoV-2 by FDA under an Emergency Use Authorization (EUA). This EUA will remain in effect (meaning this test can be used) for the duration of the COVID-19 declaration under Section 564(b)(1) of the Act, 21 U.S.C. section 360bbb-3(b)(1), unless the authorization is terminated or revoked.  Performed at [facility], [address] [place], [place] [address]  Culture, blood (x 2)     Status: None   Collection Time: 07/02/20  8:00 AM   Specimen: BLOOD  Result Value Ref Range Status   Specimen Description BLOOD RIGHT ANTECUBITAL  Final   Special Requests   Final    BOTTLES DRAWN AEROBIC AND ANAEROBIC Blood Culture adequate volume   Culture   Final    NO GROWTH 5 DAYS Performed at [facility], [address] [place], [place] [address]    Report Status 07/07/2020 FINAL  Final  Culture, blood (x 2)     Status: None   Collection Time: 07/02/20  8:00 AM   Specimen: BLOOD  Result Value Ref Range Status   Specimen Description BLOOD LEFT ANTECUBITAL  Final   Special Requests   Final    BOTTLES DRAWN AEROBIC AND ANAEROBIC Blood Culture adequate volume   Culture   Final    NO GROWTH 5 DAYS Performed at [facility], [address],  [place], [place] [address]    Report Status 07/07/2020 FINAL  Final  Urine Culture     Status: None   Collection Time: 07/02/20  4:26 PM   Specimen: Kidney; Urine  Result Value Ref Range Status   Specimen Description   Final    KIDNEY left kidney Performed at [facility], [address], [place], [place] [address]    Special Requests   Final    clindamycin, rocephin Performed at [facility], [address] [place], [place] [address]    Culture   Final    NO GROWTH Performed at [facility], [address] [place], [place] [address]    Report Status 07/03/2020 FINAL  Final         Radiology Studies: CT Renal Stone Study  Result Date: 07/08/2020 CLINICAL DATA:  Flank pain.  History of kidney stones. EXAM: CT ABDOMEN AND PELVIS WITHOUT CONTRAST TECHNIQUE: Multidetector CT imaging of the abdomen and pelvis was performed following the standard protocol without IV contrast. COMPARISON:  None. FINDINGS: LOWER CHEST: Normal. HEPATOBILIARY: Normal hepatic contours. No intra- or extrahepatic biliary dilatation. The gallbladder is normal. PANCREAS: Normal pancreas. No ductal dilatation or peripancreatic fluid collection. SPLEEN: Normal. ADRENALS/URINARY TRACT: The adrenal glands are normal. No ureterolithiasis. Stone at the lower pole of the left kidney measures 5 mm. There other smaller, more superior stones. There is mild right perinephric stranding. The urinary bladder is normal for degree of distention STOMACH/BOWEL: No hiatal hernia. Normal duodenal course and caliber. no small bowel dilatation or inflammation. No focal colonic abnormality. Normal appendix. VASCULAR/LYMPHATIC: Normal course and caliber of the major abdominal vessels. No abdominal or pelvic lymphadenopathy. REPRODUCTIVE: Normal prostate size with symmetric seminal vesicles. MUSCULOSKELETAL. No bony spinal canal stenosis or focal osseous abnormality. OTHER: None. IMPRESSION: 1. Left-sided nephrolithiasis  without obstructive uropathy. 2. Mild right perinephric stranding. Electronically Signed   By: Deatra Robinson M.D.   On: 07/08/2020 02:33        Scheduled Meds: . phenazopyridine  100 mg Oral TID WC  . tamsulosin  0.4 mg Oral Daily   Continuous Infusions: . sodium chloride 100 mL/hr at 07/09/20 1015    Assessment & Plan:   Principal Problem:   Acute right flank pain Active Problems:   Nephrolithiasis   Acute right flank pain Patient has a history of nephrolithiasis and is status post recent left-sided lithotripsy as well as bilateral ureteral stent exchange He presents for evaluation of right flank pain and has pyuria  as well as right perinephric stranding Per urology the CT scan findings are expected as well as the pyuria 2/20-urologist input was appreciated-Randon recommends trying to avoid narcotics at all possible and try to optimize with agent such as Pyridium, BNO suppository, antianxiolytics and Toradol. Discussed with patient stopping narcotics such as morphine to begin with since he is getting IV and try to use the other medications as urology recommended he is agreeable to this. We will continue IV hydration If pain is controlled with current regimen can DC tomorrow  DVT prophylaxis: SCD Code Status: Full Family Communication: None at bedside  Status is: Observation  The patient remains OBS appropriate and will d/c before 2 midnights.  Dispo: The patient is from: Home              Anticipated d/c is to: Home              Anticipated d/c date is: 1 day              Patient currently is not medically stable to d/c.   Difficult to place patient No  Patient needs pain control prior to dc to prevent readmission.            LOS: 0 days   Time spent: with >50% on coc    Lynn Ito, MD [facility] Pager [phone]  If 7PM-7AM, please contact night-coverage 07/09/2020, 12:32 PM

## 2020-07-10 ENCOUNTER — Institutional Professional Consult (permissible substitution): Payer: BLUE CROSS/BLUE SHIELD | Admitting: Medical

## 2020-07-10 DIAGNOSIS — R109 Unspecified abdominal pain: Secondary | ICD-10-CM | POA: Diagnosis not present

## 2020-07-10 MED ORDER — TAMSULOSIN HCL 0.4 MG PO CAPS: 0.4000 mg | ORAL_CAPSULE | Freq: Every day | ORAL | 0 refills | Status: AC

## 2020-07-10 MED ORDER — PHENAZOPYRIDINE HCL 100 MG PO TABS: 100.0000 mg | ORAL_TABLET | Freq: Three times a day (TID) | ORAL | 0 refills | Status: AC

## 2020-07-10 NOTE — Plan of Care (Signed)
  Problem: Clinical Measurements: Goal: Will remain free from infection 07/10/2020 1039 by Jean Rosenthal, RN Outcome: Adequate for Discharge 07/10/2020 1038 by Jean Rosenthal, RN Outcome: Progressing Goal: Diagnostic test results will improve 07/10/2020 1039 by Jean Rosenthal, RN Outcome: Adequate for Discharge 07/10/2020 1038 by Jean Rosenthal, RN Outcome: Progressing   Problem: Pain Managment: Goal: General experience of comfort will improve 07/10/2020 1039 by Jean Rosenthal, RN Outcome: Adequate for Discharge 07/10/2020 1038 by Jean Rosenthal, RN Outcome: Progressing   Problem: Safety: Goal: Ability to remain free from injury will improve 07/10/2020 1039 by Jean Rosenthal, RN Outcome: Adequate for Discharge 07/10/2020 1038 by Jean Rosenthal, RN Outcome: Progressing

## 2020-07-10 NOTE — Plan of Care (Signed)
?  Problem: Clinical Measurements: ?Goal: Will remain free from infection ?Outcome: Progressing ?Goal: Diagnostic test results will improve ?Outcome: Progressing ?  ?Problem: Pain Managment: ?Goal: General experience of comfort will improve ?Outcome: Progressing ?  ?Problem: Safety: ?Goal: Ability to remain free from injury will improve ?Outcome: Progressing ?  ?

## 2020-07-10 NOTE — Discharge Summary (Signed)
Jacob Nixon. ZOX:[id number] DOB: [birth year] DOA: 07/08/2020  PCP: Patient, No Pcp Per  Admit date: 07/08/2020 Discharge date: 07/10/2020  Admitted From: Home Disposition: Home  Recommendations for Outpatient Follow-up:  1. Follow up with PCP in 1 week 2. Please obtain BMP/CBC in one week 3. Follow-up Dr. Apolinar Junes urology in 1 week   Discharge Condition:Stable CODE STATUS: Full Diet recommendation: Heart Healthy   Brief/Interim Summary: Per HPI Jacob Nixon. is a 30 y.o. male with medical history significant for nephrolithiasis status post recent lithotripsy and bilateral ureteral stent exchange for nephrolithiasis who returns to the emergency room couple of hours post discharge for evaluation of worsening right flank pain.  He rates his pain a 10 x 10 in intensity at its worst.Patient received multiple doses of hydromorphone in the ER without any significant improvement in his pain and will be referred to observation status for further evaluation.  Urology has been consulted.  Overnight his pain was controlled and he was stable to be discharged today.  Acute right flank pain Patient has a history of nephrolithiasis and is status post recent left-sided lithotripsy as well as bilateral ureteral stent exchange He presents for evaluation of right flank pain and has pyuria as well as right perinephric stranding Per urology the CT scan findingsareexpected as well as the pyuria Urologist input was appreciated- recommended trying to avoid narcotics at all possible and try to optimize with agent such as Pyridium, BNO suppository, antianxiolytics and Toradol. Discussed with patient stopping narcotics such as morphine to begin with since he is getting IV and try to use the other medications as urology recommended he is agreeable to this. His sx improved and pt was ok to be discharged home today.   Discharge Diagnoses:  Principal Problem:   Acute right flank pain Active Problems:    Nephrolithiasis    Discharge Instructions  Discharge Instructions    Call MD for:  temperature >100.4   Complete by: As directed    Diet - low sodium heart healthy   Complete by: As directed    Discharge instructions   Complete by: As directed    Follow up with urology in one week   Increase activity slowly   Complete by: As directed    Leave dressing on - Keep it clean, dry, and intact until clinic visit   Complete by: As directed      Allergies as of 07/10/2020      Reactions   Coconut Flavor    Scratchy throat   Penicillins    DID THE REACTION INVOLVE: Swelling of the face/tongue/throat, SOB, or low BP? Y Sudden or severe rash/hives, skin peeling, or the inside of the mouth or nose? N  Did it require medical treatment? Y When did it last happen?January 2020 If all above answers are "NO", may proceed with cephalosporin use.      Medication List    STOP taking these medications   acetaZOLAMIDE 125 MG tablet Commonly known as: DIAMOX   naproxen 500 MG EC tablet Commonly known as: EC NAPROSYN     TAKE these medications   acetaminophen 500 MG tablet Commonly known as: TYLENOL Take 2 tablets (1,000 mg total) by mouth every 8 (eight) hours as needed for mild pain, moderate pain, fever or headache.   doxycycline 100 MG tablet Commonly known as: VIBRA-TABS Take 1 tablet (100 mg total) by mouth every 12 (twelve) hours.   ondansetron 4 MG disintegrating tablet Commonly known as: Zofran ODT  Take 1 tablet (4 mg total) by mouth every 8 (eight) hours as needed.   Oxycodone HCl 10 MG Tabs Take 1 tablet (10 mg total) by mouth every 12 (twelve) hours as needed for up to 5 days.   phenazopyridine 100 MG tablet Commonly known as: PYRIDIUM Take 1 tablet (100 mg total) by mouth 3 (three) times daily with meals for 10 days.   tamsulosin 0.4 MG Caps capsule Commonly known as: FLOMAX Take 1 capsule (0.4 mg total) by mouth daily for 7 days.   traZODone 50 MG  tablet Commonly known as: DESYREL Take 0.5-1 tablets (25-50 mg total) by mouth at bedtime as needed for sleep.            Discharge Care Instructions  (From admission, onward)         Start     Ordered   07/10/20 0000  Leave dressing on - Keep it clean, dry, and intact until clinic visit        07/10/20 1015          Follow-up Information    Jacob Nixon, Ashley, MD Follow up in 1 week(s).   Specialty: Urology Contact information: [address] [place] [phone]              Allergies  Allergen Reactions  . Coconut Flavor     Scratchy throat  . Penicillins     DID THE REACTION INVOLVE: Swelling of the face/tongue/throat, SOB, or low BP? Y Sudden or severe rash/hives, skin peeling, or the inside of the mouth or nose? N  Did it require medical treatment? Y When did it last happen?January 2020 If all above answers are "NO", may proceed with cephalosporin use.     Consultations:  Urology   Procedures/Studies: DG Abdomen 1 View  Result Date: 07/02/2020 CLINICAL DATA:  Left flank pain.  Recent lithotripsy. EXAM: ABDOMEN - 1 VIEW COMPARISON:  Radiograph 06/22/2020.  Most recent CT 06/09/2020 FINDINGS: Previous stone in the region of the left ureteropelvic junction is no longer seen. No definite stone or stone fragments along the course of the ureter or in the pelvis. No evidence of radiopaque calculi. No bowel dilatation. Occasional air-fluid levels within nondilated small bowel in the central abdomen. No free intra-abdominal air. Lung bases are clear. No acute osseous abnormalities are seen. IMPRESSION: Previous left UPJ calculus no longer seen. No radiograph evidence of stone or stone fragments along the course of the ureters or in the pelvis. Electronically Signed   By: Narda RutherfordMelanie  Sanford M.D.   On: 07/02/2020 00:30   DG Abd 1 View  Result Date: 06/22/2020 CLINICAL DATA:  Preoperative study.  Left UPJ stone. EXAM: ABDOMEN - 1  VIEW COMPARISON:  06/15/2020. FINDINGS: Stable 6 mm calcific density noted over the left UPJ region. This is consistent with known left UPJ stone. No significant interim change in position. Nonspecific air-filled loops of small bowel noted. Colon is nondistended. No free air identified. No acute bony abnormality. IMPRESSION: Stable 6 mm calcific density noted over the left UPJ region. This is consistent with known left UPJ stone. No significant interim change in position. Electronically Signed   By: Maisie Fushomas  Register   On: 06/22/2020 06:59   Abdomen 1 view (KUB)  Result Date: 06/15/2020 CLINICAL DATA:  Kidney stones EXAM: ABDOMEN - 1 VIEW COMPARISON:  10/11/2018, CT 06/09/2020 FINDINGS: Nonobstructed gas pattern. 6 mm calcification to the left of L1, in the region of left UPJ. IMPRESSION: 6 mm  calcification/suspected stone to the left of L1, in the region of left UPJ or proximal ureter. Electronically Signed   By: Jasmine Pang M.D.   On: 06/15/2020 17:35   CT ABDOMEN PELVIS W CONTRAST  Result Date: 07/06/2020 CLINICAL DATA:  Acute onset right flank pain, history of renal calculi and left ureteral stent placement EXAM: CT ABDOMEN AND PELVIS WITH CONTRAST TECHNIQUE: Multidetector CT imaging of the abdomen and pelvis was performed using the standard protocol following bolus administration of intravenous contrast. CONTRAST:  OMNIPAQUE IOHEXOL 300 MG/ML  SOLN COMPARISON:  07/02/2020, 07/06/2020 FINDINGS: Lower chest: Patchy airspace disease within the lingula could reflect infection or aspiration. No effusion or pneumothorax. Hepatobiliary: No focal liver abnormality is seen. No gallstones, gallbladder wall thickening, or biliary dilatation. Pancreas: Unremarkable. No pancreatic ductal dilatation or surrounding inflammatory changes. Spleen: Normal in size without focal abnormality. Adrenals/Urinary Tract: There has been interval placement of a left ureteral stent, extending from the left renal pelvis to the  bladder lumen. The left-sided obstructive uropathy seen previously has resolved in the interim. Left UPJ calculus on prior study has been removed. Fragmented calculi are seen within a lower pole calyx, measuring less than 3 mm. The left perirenal hematoma on prior study is unchanged measuring 11 mm in thickness, consistent with known history of recent lithotripsy. The recently placed right ureteral stent has migrated into the bladder lumen. There has been interval development of right-sided obstructive uropathy, though I do not see any residual calculus within the right ureter. Peri ureteral fat stranding surrounding the proximal right ureter. Gas within the right renal collecting system consistent with recent cystoscopy. Minimal excretion of contrast noted in the right kidney on delayed images. Adrenals are unremarkable. Minimal gas within the bladder lumen consistent with recent cystoscopy. No filling defects. Stomach/Bowel: No bowel obstruction or ileus. Normal appendix right lower quadrant. No bowel wall thickening or inflammatory change. Vascular/Lymphatic: No significant vascular findings are present. No enlarged abdominal or pelvic lymph nodes. Reproductive: Prostate is unremarkable. Other: No free fluid or free gas.  No abdominal wall hernia. Musculoskeletal: No acute or destructive bony lesions. Reconstructed images demonstrate no additional findings. IMPRESSION: 1. Interval development of right-sided obstructive uropathy, with no residual right-sided calculus identified. The right ureteral stent placed earlier today has migrated into the bladder lumen. 2. Stable position of the left ureteral stent, with resolution of obstructive uropathy seen previously. Residual calculi lower pole left kidney, with stable left perirenal hematoma compatible with recent lithotripsy. 3. Minimal gas within the bladder lumen and right renal calices consistent with recent cystoscopy and ureteroscopy. 4. Patchy lingular airspace  disease, which could reflect infection or aspiration. Electronically Signed   By: Sharlet Salina M.D.   On: 07/06/2020 20:07   DG OR UROLOGY CYSTO IMAGE ([facility] ONLY)  Result Date: 07/06/2020 There is no interpretation for this exam.  This order is for images obtained during a surgical procedure.  Please See "Surgeries" Tab for more information regarding the procedure.   DG OR UROLOGY CYSTO IMAGE ([facility] ONLY)  Result Date: 07/02/2020 There is no interpretation for this exam.  This order is for images obtained during a surgical procedure.  Please See "Surgeries" Tab for more information regarding the procedure.   DG OR UROLOGY CYSTO IMAGE ([facility] ONLY)  Result Date: 07/02/2020 There is no interpretation for this exam.  This order is for images obtained during a surgical procedure.  Please See "Surgeries" Tab for more information regarding the procedure.   DG OR UROLOGY  CYSTO IMAGE [facility] ONLY)  Result Date: 07/02/2020 There is no interpretation for this exam.  This order is for images obtained during a surgical procedure.  Please See "Surgeries" Tab for more information regarding the procedure.   CT Renal Stone Study  Result Date: 07/08/2020 CLINICAL DATA:  Flank pain.  History of kidney stones. EXAM: CT ABDOMEN AND PELVIS WITHOUT CONTRAST TECHNIQUE: Multidetector CT imaging of the abdomen and pelvis was performed following the standard protocol without IV contrast. COMPARISON:  None. FINDINGS: LOWER CHEST: Normal. HEPATOBILIARY: Normal hepatic contours. No intra- or extrahepatic biliary dilatation. The gallbladder is normal. PANCREAS: Normal pancreas. No ductal dilatation or peripancreatic fluid collection. SPLEEN: Normal. ADRENALS/URINARY TRACT: The adrenal glands are normal. No ureterolithiasis. Stone at the lower pole of the left kidney measures 5 mm. There other smaller, more superior stones. There is mild right perinephric stranding. The urinary bladder is normal for degree of distention  STOMACH/BOWEL: No hiatal hernia. Normal duodenal course and caliber. no small bowel dilatation or inflammation. No focal colonic abnormality. Normal appendix. VASCULAR/LYMPHATIC: Normal course and caliber of the major abdominal vessels. No abdominal or pelvic lymphadenopathy. REPRODUCTIVE: Normal prostate size with symmetric seminal vesicles. MUSCULOSKELETAL. No bony spinal canal stenosis or focal osseous abnormality. OTHER: None. IMPRESSION: 1. Left-sided nephrolithiasis without obstructive uropathy. 2. Mild right perinephric stranding. Electronically Signed   By: Deatra Robinson M.D.   On: 07/08/2020 02:33   CT Renal Stone Study  Result Date: 07/02/2020 CLINICAL DATA:  Left flank pain.  Previous lithotripsy. EXAM: CT ABDOMEN AND PELVIS WITHOUT CONTRAST TECHNIQUE: Multidetector CT imaging of the abdomen and pelvis was performed following the standard protocol without IV contrast. COMPARISON:  06/09/2020 FINDINGS: Lower chest: Normal Hepatobiliary: Normal Pancreas: Normal Spleen: Normal Adrenals/Urinary Tract: Adrenal glands are normal. Right kidney shows a 3 mm stone at the UPJ without gross hydronephrosis. Left kidney shows a 6 mm stone at the left UPJ with mild fullness of the renal collecting system. Nonobstructing 3 mm stone in the lower pole. No stone more distal in either ureter. No stone in the bladder. Stomach/Bowel: Stomach and small intestine are normal. No colon pathology. Normal appendix. Vascular/Lymphatic: Normal Reproductive: Normal Other: No free fluid or air. Musculoskeletal: Normal IMPRESSION: 1. 6 mm stone at the left UPJ/proximal ureter with mild fullness of the renal collecting system. 3 mm nonobstructing stone in the lower pole of the left kidney. 2. 3 mm stone at the right UPJ/proximal ureter without appreciable hydronephrosis. Electronically Signed   By: Paulina Fusi M.D.   On: 07/02/2020 03:56       Subjective: Has no pain.  Overall doing well  Discharge Exam: Vitals:    07/10/20 0439 07/10/20 0717  BP: 129/79 (!) 137/96  Pulse: 70 65  Resp: 18 16  Temp: 97.7 F (36.5 C) 98.2 F (36.8 C)  SpO2: 99% 99%   Vitals:   07/09/20 2006 07/09/20 2326 07/10/20 0439 07/10/20 0717  BP: 125/74 137/88 129/79 (!) 137/96  Pulse: 73 68 70 65  Resp: 18 18 18 16   Temp: 98.2 F (36.8 C) 98 F (36.7 C) 97.7 F (36.5 C) 98.2 F (36.8 C)  TempSrc:      SpO2: 99% 98% 99% 99%  Weight:      Height:        General: Pt is alert, awake, not in acute distress Cardiovascular: RRR, S1/S2 +, no rubs, no gallops Respiratory: CTA bilaterally, no wheezing, no rhonchi Abdominal: Soft, NT, ND, bowel sounds + Extremities: no edema, no cyanosis  The results of significant diagnostics from this hospitalization (including imaging, microbiology, ancillary and laboratory) are listed below for reference.     Microbiology: Recent Results (from the past 240 hour(s))  Urine Culture     Status: None   Collection Time: 07/02/20 12:01 AM   Specimen: Urine, Clean Catch  Result Value Ref Range Status   Specimen Description   Final    URINE, CLEAN CATCH Performed at [facility], [address], [place], [place] [address]    Special Requests   Final    NONE Performed at [facility], [address] [place], [place] [address]    Culture   Final    NO GROWTH Performed at [facility], [address], [place], [place] [address]    Report Status 07/03/2020 FINAL  Final  Resp Panel by RT-PCR (Flu A&B, Covid) Nasopharyngeal Swab     Status: None   Collection Time: 07/02/20  5:41 AM   Specimen: Nasopharyngeal Swab; Nasopharyngeal(NP) swabs in vial transport medium  Result Value Ref Range Status   SARS Coronavirus 2 by RT PCR NEGATIVE NEGATIVE Final    Comment: (NOTE) SARS-CoV-2 target nucleic acids are NOT DETECTED.  The SARS-CoV-2 RNA is generally detectable in upper respiratory specimens during the acute phase of infection. The lowest concentration  of SARS-CoV-2 viral copies this assay can detect is 138 copies/mL. A negative result does not preclude SARS-Cov-2 infection and should not be used as the sole basis for treatment or other patient management decisions. A negative result may occur with  improper specimen collection/handling, submission of specimen other than nasopharyngeal swab, presence of viral mutation(s) within the areas targeted by this assay, and inadequate number of viral copies(<138 copies/mL). A negative result must be combined with clinical observations, patient history, and epidemiological information. The expected result is Negative.  Fact Sheet for Patients:  BloggerCourse.com  Fact Sheet for Healthcare Providers:  SeriousBroker.it  This test is no t yet approved or cleared by the [place] FDA and  has been authorized for detection and/or diagnosis of SARS-CoV-2 by FDA under an Emergency Use Authorization (EUA). This EUA will remain  in effect (meaning this test can be used) for the duration of the COVID-19 declaration under Section 564(b)(1) of the Act, 21 U.S.C.section 360bbb-3(b)(1), unless the authorization is terminated  or revoked sooner.       Influenza A by PCR NEGATIVE NEGATIVE Final   Influenza B by PCR NEGATIVE NEGATIVE Final    Comment: (NOTE) The Xpert Xpress SARS-CoV-2/FLU/RSV plus assay is intended as an aid in the diagnosis of influenza from Nasopharyngeal swab specimens and should not be used as a sole basis for treatment. Nasal washings and aspirates are unacceptable for Xpert Xpress SARS-CoV-2/FLU/RSV testing.  Fact Sheet for Patients: BloggerCourse.com  Fact Sheet for Healthcare Providers: SeriousBroker.it  This test is not yet approved or cleared by the [place] FDA and has been authorized for detection and/or diagnosis of SARS-CoV-2 by FDA under an Emergency Use  Authorization (EUA). This EUA will remain in effect (meaning this test can be used) for the duration of the COVID-19 declaration under Section 564(b)(1) of the Act, 21 U.S.C. section 360bbb-3(b)(1), unless the authorization is terminated or revoked.  Performed at [facility], [address] [place], [place] [address]   Culture, blood (x 2)     Status: None   Collection Time: 07/02/20  8:00 AM   Specimen: BLOOD  Result Value Ref Range Status   Specimen  Description BLOOD RIGHT ANTECUBITAL  Final   Special Requests   Final    BOTTLES DRAWN AEROBIC AND ANAEROBIC Blood Culture adequate volume   Culture   Final    NO GROWTH 5 DAYS Performed at [facility], [address] [place], [place] [address]    Report Status 07/07/2020 FINAL  Final  Culture, blood (x 2)     Status: None   Collection Time: 07/02/20  8:00 AM   Specimen: BLOOD  Result Value Ref Range Status   Specimen Description BLOOD LEFT ANTECUBITAL  Final   Special Requests   Final    BOTTLES DRAWN AEROBIC AND ANAEROBIC Blood Culture adequate volume   Culture   Final    NO GROWTH 5 DAYS Performed at [facility], [address] [place], [place] [address]    Report Status 07/07/2020 FINAL  Final  Urine Culture     Status: None   Collection Time: 07/02/20  4:26 PM   Specimen: Kidney; Urine  Result Value Ref Range Status   Specimen Description   Final    KIDNEY left kidney Performed at [facility], [address], [place], [place] [address]    Special Requests   Final    clindamycin, rocephin Performed at [facility], [address] [place], [place] [address]    Culture   Final    NO GROWTH Performed at [facility], [address] [place], [place] [address]    Report Status 07/03/2020 FINAL  Final  Urine culture     Status: None   Collection Time: 07/07/20 11:42 PM   Specimen: Urine, Random  Result Value Ref Range Status   Specimen Description    Final    URINE, RANDOM Performed at [facility], [address] [place], [place] [address]    Special Requests   Final    NONE Performed at [facility], [address] [place], [place] [address]    Culture   Final    NO GROWTH Performed at [facility], [address] [place], [place] [address]    Report Status 07/09/2020 FINAL  Final     Labs: BNP (last 3 results) No results for input(s): BNP in the last 8760 hours. Basic Metabolic Panel: Recent Labs  Lab 07/06/20 0412 07/07/20 2342 07/09/20 0455  NA 139 136 137  K 4.0 3.5 4.0  CL 103 104 107  CO2 GLUCOSE 116* 127* 104*  BUN CREATININE 0.95 1.05 0.78  CALCIUM 8.9 8.5* 8.6*   Liver Function Tests: Recent Labs  Lab 07/06/20 0412  AST 19  ALT 22  ALKPHOS 71  BILITOT 0.4  PROT 6.5  ALBUMIN 3.0*   No results for input(s): LIPASE, AMYLASE in the last 168 hours. No results for input(s): AMMONIA in the last 168 hours. CBC: Recent Labs  Lab 07/06/20 0412 07/07/20 2342 07/09/20 0455  WBC 12.7* 11.2* 7.7  HGB 13.3 13.6 13.1  HCT 40.1 41.0 38.5*  MCV 86.8 86.9 87.1  PLT 301 303 287   Cardiac Enzymes: No results for input(s): CKTOTAL, CKMB, CKMBINDEX, TROPONINI in the last 168 hours. BNP: Invalid input(s): POCBNP CBG: Recent Labs  Lab 07/04/20 0843 07/06/20 0730 07/06/20 1127 07/07/20 0802  GLUCAP 110* 94 91 93   D-Dimer No results for input(s): DDIMER in the last 72 hours. Hgb A1c No results for input(s): HGBA1C in the last 72 hours. Lipid Profile No  results for input(s): CHOL, HDL, LDLCALC, TRIG, CHOLHDL, LDLDIRECT in the last 72 hours. Thyroid function studies No results for input(s): TSH, T4TOTAL, T3FREE, THYROIDAB in the last 72 hours.  Invalid input(s): FREET3 Anemia work up No results for input(s): VITAMINB12, FOLATE, FERRITIN, TIBC, IRON, RETICCTPCT in the last 72 hours. Urinalysis    Component Value Date/Time   COLORURINE AMBER (A)  07/07/2020 2342   APPEARANCEUR CLOUDY (A) 07/07/2020 2342   APPEARANCEUR Cloudy (A) 06/15/2020 1411   LABSPEC 1.009 07/07/2020 2342   PHURINE 9.0 (H) 07/07/2020 2342   GLUCOSEU NEGATIVE 07/07/2020 2342   HGBUR LARGE (A) 07/07/2020 2342   BILIRUBINUR NEGATIVE 07/07/2020 2342   BILIRUBINUR Negative 06/15/2020 1411   KETONESUR NEGATIVE 07/07/2020 2342   PROTEINUR 100 (A) 07/07/2020 2342   UROBILINOGEN 1.0 11/28/2014 1026   NITRITE NEGATIVE 07/07/2020 2342   LEUKOCYTESUR TRACE (A) 07/07/2020 2342   Sepsis Labs Invalid input(s): PROCALCITONIN,  WBC,  LACTICIDVEN Microbiology Recent Results (from the past 240 hour(s))  Urine Culture     Status: None   Collection Time: 07/02/20 12:01 AM   Specimen: Urine, Clean Catch  Result Value Ref Range Status   Specimen Description   Final    URINE, CLEAN CATCH Performed at [facility], [address] [place], [place] [address]    Special Requests   Final    NONE Performed at [facility], [address] [place], [place] [address]    Culture   Final    NO GROWTH Performed at [facility], [address] [place], [place] [address]    Report Status 07/03/2020 FINAL  Final  Resp Panel by RT-PCR (Flu A&B, Covid) Nasopharyngeal Swab     Status: None   Collection Time: 07/02/20  5:41 AM   Specimen: Nasopharyngeal Swab; Nasopharyngeal(NP) swabs in vial transport medium  Result Value Ref Range Status   SARS Coronavirus 2 by RT PCR NEGATIVE NEGATIVE Final    Comment: (NOTE) SARS-CoV-2 target nucleic acids are NOT DETECTED.  The SARS-CoV-2 RNA is generally detectable in upper respiratory specimens during the acute phase of infection. The lowest concentration of SARS-CoV-2 viral copies this assay can detect is 138 copies/mL. A negative result does not preclude SARS-Cov-2 infection and should not be used as the sole basis for treatment or other patient management decisions. A negative result may occur with  improper  specimen collection/handling, submission of specimen other than nasopharyngeal swab, presence of viral mutation(s) within the areas targeted by this assay, and inadequate number of viral copies(<138 copies/mL). A negative result must be combined with clinical observations, patient history, and epidemiological information. The expected result is Negative.  Fact Sheet for Patients:  BloggerCourse.com  Fact Sheet for Healthcare Providers:  SeriousBroker.it  This test is no t yet approved or cleared by the [place] FDA and  has been authorized for detection and/or diagnosis of SARS-CoV-2 by FDA under an Emergency Use Authorization (EUA). This EUA will remain  in effect (meaning this test can be used) for the duration of the COVID-19 declaration under Section 564(b)(1) of the Act, 21 U.S.C.section 360bbb-3(b)(1), unless the authorization is terminated  or revoked sooner.       Influenza A by PCR NEGATIVE NEGATIVE Final   Influenza B by PCR NEGATIVE NEGATIVE Final    Comment: (NOTE) The Xpert Xpress SARS-CoV-2/FLU/RSV plus assay is intended as an aid in the diagnosis of influenza from Nasopharyngeal swab specimens and should not be used as a sole basis for treatment.  Nasal washings and aspirates are unacceptable for Xpert Xpress SARS-CoV-2/FLU/RSV testing.  Fact Sheet for Patients: BloggerCourse.com  Fact Sheet for Healthcare Providers: SeriousBroker.it  This test is not yet approved or cleared by the [place] FDA and has been authorized for detection and/or diagnosis of SARS-CoV-2 by FDA under an Emergency Use Authorization (EUA). This EUA will remain in effect (meaning this test can be used) for the duration of the COVID-19 declaration under Section 564(b)(1) of the Act, 21 U.S.C. section 360bbb-3(b)(1), unless the authorization is terminated or revoked.  Performed at  [facility], [address] [place], [place] [address]   Culture, blood (x 2)     Status: None   Collection Time: 07/02/20  8:00 AM   Specimen: BLOOD  Result Value Ref Range Status   Specimen Description BLOOD RIGHT ANTECUBITAL  Final   Special Requests   Final    BOTTLES DRAWN AEROBIC AND ANAEROBIC Blood Culture adequate volume   Culture   Final    NO GROWTH 5 DAYS Performed at [facility], [address] [place], [place] [address]    Report Status 07/07/2020 FINAL  Final  Culture, blood (x 2)     Status: None   Collection Time: 07/02/20  8:00 AM   Specimen: BLOOD  Result Value Ref Range Status   Specimen Description BLOOD LEFT ANTECUBITAL  Final   Special Requests   Final    BOTTLES DRAWN AEROBIC AND ANAEROBIC Blood Culture adequate volume   Culture   Final    NO GROWTH 5 DAYS Performed at [facility], [address] [place], [place] [address]    Report Status 07/07/2020 FINAL  Final  Urine Culture     Status: None   Collection Time: 07/02/20  4:26 PM   Specimen: Kidney; Urine  Result Value Ref Range Status   Specimen Description   Final    KIDNEY left kidney Performed at [facility], [address], [place], [place] [address]    Special Requests   Final    clindamycin, rocephin Performed at [facility], [address], [place], [place] [address]    Culture   Final    NO GROWTH Performed at [facility], [address] [place], [place] [address]    Report Status 07/03/2020 FINAL  Final  Urine culture     Status: None   Collection Time: 07/07/20 11:42 PM   Specimen: Urine, Random  Result Value Ref Range Status   Specimen Description   Final    URINE, RANDOM Performed at [facility], [address] [place], [place], [place] [address]    Special Requests   Final    NONE Performed at [facility], [address] [place] [place], [place] [address]    Culture   Final    NO GROWTH Performed at  [facility], [address], [place], [place] [address]    Report Status 07/09/2020 FINAL  Final     Time coordinating discharge: Over 30 minutes  SIGNED:   Lynn Ito, MD  [facility] 07/10/2020, 10:16 AM Pager   If 7PM-7AM, please contact night-coverage [email or web address] Password [email or web address]

## 2020-07-11 ENCOUNTER — Ambulatory Visit: Payer: Self-pay | Admitting: Urology

## 2020-07-11 LAB — CALCULI, WITH PHOTOGRAPH (CLINICAL LAB)
Calcium Oxalate Dihydrate: 90 %
Calcium Oxalate Monohydrate: 10 %
Weight Calculi: 2 mg

## 2020-07-11 NOTE — Telephone Encounter (Signed)
App made Patient requested to see Harle Battiest

## 2020-08-11 ENCOUNTER — Ambulatory Visit: Admission: RE | Admit: 2020-08-11 | Payer: BC Managed Care – PPO | Source: Ambulatory Visit

## 2020-08-16 ENCOUNTER — Ambulatory Visit: Payer: BC Managed Care – PPO | Admitting: Urology

## 2020-10-23 ENCOUNTER — Emergency Department (HOSPITAL_COMMUNITY)
Admission: EM | Admit: 2020-10-23 | Discharge: 2020-10-24 | Disposition: A | Discharge status: Home / Self Care | Payer: BC Managed Care – PPO | Attending: Emergency Medicine | Admitting: Emergency Medicine

## 2020-10-23 DIAGNOSIS — Z955 Presence of coronary angioplasty implant and graft: Secondary | ICD-10-CM | POA: Insufficient documentation

## 2020-10-23 DIAGNOSIS — Z87891 Personal history of nicotine dependence: Secondary | ICD-10-CM | POA: Insufficient documentation

## 2020-10-23 DIAGNOSIS — R079 Chest pain, unspecified: Secondary | ICD-10-CM | POA: Insufficient documentation

## 2020-10-23 DIAGNOSIS — M25512 Pain in left shoulder: Secondary | ICD-10-CM | POA: Diagnosis present

## 2020-10-23 DIAGNOSIS — M7522 Bicipital tendinitis, left shoulder: Secondary | ICD-10-CM | POA: Insufficient documentation

## 2020-10-23 DIAGNOSIS — R202 Paresthesia of skin: Secondary | ICD-10-CM | POA: Diagnosis not present

## 2020-10-23 DIAGNOSIS — R252 Cramp and spasm: Secondary | ICD-10-CM

## 2020-10-23 DIAGNOSIS — M7542 Impingement syndrome of left shoulder: Secondary | ICD-10-CM | POA: Diagnosis not present

## 2020-10-23 DIAGNOSIS — M778 Other enthesopathies, not elsewhere classified: Secondary | ICD-10-CM

## 2020-10-24 ENCOUNTER — Encounter (HOSPITAL_COMMUNITY): Payer: Self-pay | Admitting: Emergency Medicine

## 2020-10-24 ENCOUNTER — Other Ambulatory Visit: Payer: Self-pay

## 2020-10-24 ENCOUNTER — Emergency Department (HOSPITAL_COMMUNITY): Payer: BC Managed Care – PPO

## 2020-10-24 LAB — CBC
HCT: 46.7 % (ref 39.0–52.0)
Hemoglobin: 15.7 g/dL (ref 13.0–17.0)
MCH: 29.2 pg (ref 26.0–34.0)
MCHC: 33.6 g/dL (ref 30.0–36.0)
MCV: 87 fL (ref 80.0–100.0)
Platelets: 339 10*3/uL (ref 150–400)
RBC: 5.37 MIL/uL (ref 4.22–5.81)
RDW: 13.6 % (ref 11.5–15.5)
WBC: 10.6 10*3/uL — ABNORMAL HIGH (ref 4.0–10.5)
nRBC: 0 % (ref 0.0–0.2)

## 2020-10-24 LAB — BASIC METABOLIC PANEL
Anion gap: 8 (ref 5–15)
BUN: 12 mg/dL (ref 6–20)
CO2: 25 mmol/L (ref 22–32)
Calcium: 9 mg/dL (ref 8.9–10.3)
Chloride: 104 mmol/L (ref 98–111)
Creatinine, Ser: 0.99 mg/dL (ref 0.61–1.24)
GFR, Estimated: >60 mL/min (ref >60)
Glucose, Bld: 101 mg/dL — ABNORMAL HIGH (ref 70–99)
Potassium: 3.9 mmol/L (ref 3.5–5.1)
Sodium: 137 mmol/L (ref 135–145)

## 2020-10-24 LAB — TROPONIN I (HIGH SENSITIVITY)
Troponin I (High Sensitivity): <2 ng/L (ref <18)
Troponin I (High Sensitivity): <2 ng/L (ref <18)

## 2020-10-24 MED ORDER — NAPROXEN 500 MG PO TABS
500.0000 mg | ORAL_TABLET | Freq: Once | ORAL | Status: AC
  Administered 2020-10-24: 500 mg via ORAL
  Filled 2020-10-24: qty 1

## 2020-10-24 MED ORDER — ACETAMINOPHEN ER 650 MG PO TBCR: 650.0000 mg | EXTENDED_RELEASE_TABLET | Freq: Three times a day (TID) | ORAL | 0 refills | Status: DC

## 2020-10-24 MED ORDER — ACETAMINOPHEN 325 MG PO TABS
650.0000 mg | ORAL_TABLET | Freq: Once | ORAL | Status: AC
  Administered 2020-10-24: 650 mg via ORAL
  Filled 2020-10-24: qty 2

## 2020-10-24 MED ORDER — METHOCARBAMOL 500 MG PO TABS
500.0000 mg | ORAL_TABLET | Freq: Once | ORAL | Status: AC
  Administered 2020-10-24: 500 mg via ORAL
  Filled 2020-10-24: qty 1

## 2020-10-24 MED ORDER — NAPROXEN 500 MG PO TABS: 500.0000 mg | ORAL_TABLET | Freq: Two times a day (BID) | ORAL | 0 refills | Status: DC

## 2020-10-24 MED ORDER — METHOCARBAMOL 500 MG PO TABS: 500.0000 mg | ORAL_TABLET | Freq: Two times a day (BID) | ORAL | 0 refills | Status: DC

## 2020-10-24 NOTE — ED Provider Notes (Signed)
[facility]-EMERGENCY DEPT Provider Note   CSN: [id number] Arrival date & time: 10/23/20  2249     History Chief Complaint  Patient presents with  . Chest Pain    Jacob Nixon. is a 30 y.o. male.  HPI     30 year old male comes the ER with chief complaint of chest pain.  Patient reports that he started having shoulder pain about a week ago.  He had a mechanical fall, and he tried to break the fall with his upper extremity leading to pain in his left shoulder.  Since then he has noted some discomfort when he raises his left shoulder.  He has known history of labral tear to the same side.  2 days ago he started having intermittent episodes of chest pain.  Chest pain is sharp, unprovoked and anterior.  Last night he had some numbness and tingling in his left upper extremity prompting him to come to the ER.  Patient denies any medical history.  He does not have any history of heavy smoking, drinking, drug use and no premature CAD in the family. Pt has no hx of PE, DVT and denies any exogenous hormone (testosterone / estrogen) use, long distance travels or surgery in the past 6 weeks, active cancer, recent immobilization.   Past Medical History:  Diagnosis Date  . Bronchitis   . Cellulitis of right hand - RECURRENT   . Kidney stones     Patient Active Problem List   Diagnosis Date Noted  . Acute right flank pain 07/08/2020  . Acute unilateral obstructive uropathy 07/02/2020  . Obstructive uropathy 07/02/2020  . Acute pyelonephritis 07/02/2020  . Sepsis (HCC) 07/02/2020  . STD exposure   . Left anterior knee pain 10/13/2019  . Cervical disc disorder with radiculopathy of cervical region 08/23/2019  . Low back pain 08/23/2019  . Nonallopathic lesion of lumbosacral region 08/23/2019  . Nonallopathic lesion of sacral region 08/23/2019  . Nonallopathic lesion of thoracic region 08/23/2019  . Labral tear of shoulder 12/15/2018  . Arthritis of left  acromioclavicular joint 07/15/2018  . Impingement syndrome of left shoulder 04/01/2018  . Hyponatremia 10/26/2016  . Hypokalemia 10/26/2016  . Elevated glucose level   . Arm swelling 10/15/2016  . Prediabetes 10/15/2016  . Cellulitis of hand 08/04/2016  . Carpal tunnel syndrome on right 05/08/2016  . Abscess of upper arm   . Cellulitis 01/22/2016  . Leukocytosis 01/22/2016  . Neuropathy 01/22/2016  . Nephrolithiasis 01/22/2016  . Cellulitis of right hand   . Cellulitis of forearm, right 07/30/2015    Past Surgical History:  Procedure Laterality Date  . CARPAL TUNNEL RELEASE Left 04/09/2016   Procedure: CARPAL TUNNEL RELEASE;  Surgeon: Cammy Copa, MD;  Location: [facility] OR;  Service: Orthopedics;  Laterality: Left;  . CYSTOSCOPY WITH RETROGRADE PYELOGRAM, URETEROSCOPY AND STENT PLACEMENT Bilateral 07/02/2020   Procedure: CYSTOSCOPY WITH RETROGRADE PYELOGRAM, URETEROSCOPY AND STENT PLACEMENT POSSIBLY BILATERAL;  Surgeon: Jannifer Hick, MD;  Location: [facility] ORS;  Service: Urology;  Laterality: Bilateral;  . CYSTOSCOPY/URETEROSCOPY/HOLMIUM LASER/STENT PLACEMENT Bilateral 07/06/2020   Procedure: CYSTOSCOPY/URETEROSCOPY/HOLMIUM LASER/LEFT STENT EXCHANGE, RIGHT STENT PLACEMENT;  Surgeon: Vanna Scotland, MD;  Location: [facility] ORS;  Service: Urology;  Laterality: Bilateral;  . EXTRACORPOREAL SHOCK WAVE LITHOTRIPSY Left 06/22/2020   Procedure: EXTRACORPOREAL SHOCK WAVE LITHOTRIPSY (ESWL);  Surgeon: Vanna Scotland, MD;  Location: [facility] ORS;  Service: Urology;  Laterality: Left;  . HAND SURGERY    . KIDNEY STONE SURGERY     lithortripsy  Family History  Problem Relation Age of Onset  . Hypertension Other   . Diabetes Father   . Healthy Mother     Social History   Tobacco Use  . Smoking status: Former Smoker    Types: Cigarettes  . Smokeless tobacco: Never Used  Vaping Use  . Vaping Use: Every day  . Substances: Nicotine, Flavoring  Substance Use Topics  . Alcohol use: Yes   . Drug use: No    Home Medications Prior to Admission medications   Medication Sig Start Date End Date Taking? Authorizing Provider  acetaminophen (TYLENOL 8 HOUR) 650 MG CR tablet Take 1 tablet (650 mg total) by mouth every 8 (eight) hours. 10/24/20  Yes Derwood Kaplan, MD  methocarbamol (ROBAXIN) 500 MG tablet Take 1 tablet (500 mg total) by mouth 2 (two) times daily. 10/24/20  Yes Aprel Egelhoff, MD  naproxen (NAPROSYN) 500 MG tablet Take 1 tablet (500 mg total) by mouth 2 (two) times daily. 10/24/20  Yes Derwood Kaplan, MD  doxycycline (VIBRA-TABS) 100 MG tablet Take 1 tablet (100 mg total) by mouth every 12 (twelve) hours. 07/06/20   Danford, Earl Lites, MD  ondansetron (ZOFRAN ODT) 4 MG disintegrating tablet Take 1 tablet (4 mg total) by mouth every 8 (eight) hours as needed. 07/02/20   Nita Sickle, MD  traZODone (DESYREL) 50 MG tablet Take 0.5-1 tablets (25-50 mg total) by mouth at bedtime as needed for sleep. 06/02/19   Judi Saa, DO    Allergies    Coconut flavor and Penicillins  Review of Systems   Review of Systems  Constitutional: Positive for activity change.  Respiratory: Negative for shortness of breath.   Cardiovascular: Positive for chest pain.  Gastrointestinal: Negative for nausea and vomiting.  Neurological: Positive for numbness.  All other systems reviewed and are negative.   Physical Exam Updated Vital Signs BP 125/85   Pulse 69   Temp 98 F (36.7 C)   Resp 18   Ht 5\' 11"  (1.803 m)   Wt 117.9 kg   SpO2 100%   BMI 36.26 kg/m   Physical Exam Vitals and nursing note reviewed.  Constitutional:      Appearance: He is well-developed.  HENT:     Head: Atraumatic.  Cardiovascular:     Rate and Rhythm: Normal rate.  Pulmonary:     Effort: Pulmonary effort is normal.  Musculoskeletal:     Cervical back: Neck supple.  Skin:    General: Skin is warm.  Neurological:     Mental Status: He is alert and oriented to person, place, and time.      ED Results / Procedures / Treatments   Labs (all labs ordered are listed, but only abnormal results are displayed) Labs Reviewed  BASIC METABOLIC PANEL - Abnormal; Notable for the following components:      Result Value   Glucose, Bld 101 (*)    All other components within normal limits  CBC - Abnormal; Notable for the following components:   WBC 10.6 (*)    All other components within normal limits  TROPONIN I (HIGH SENSITIVITY)  TROPONIN I (HIGH SENSITIVITY)    EKG None   Date: 10/24/2020  Rate: 88  Rhythm: normal sinus rhythm  QRS Axis: normal  Intervals: normal  ST/T Wave abnormalities: normal  Conduction Disutrbances: none  Narrative Interpretation: unremarkable   Radiology DG Chest 2 View  Result Date: 10/24/2020 CLINICAL DATA:  Chest pain EXAM: CHEST - 2 VIEW COMPARISON:  05/21/2020 FINDINGS:  The heart size and mediastinal contours are within normal limits. Both lungs are clear. The visualized skeletal structures are unremarkable. IMPRESSION: No active cardiopulmonary disease. Electronically Signed   By: Helyn Numbers MD   On: 10/24/2020 01:38   DG Shoulder Left  Result Date: 10/24/2020 CLINICAL DATA:  Left shoulder pain EXAM: LEFT SHOULDER - 2+ VIEW COMPARISON:  None. FINDINGS: There is no evidence of fracture or dislocation. There is no evidence of arthropathy or other focal bone abnormality. Soft tissues are unremarkable. IMPRESSION: Negative. Electronically Signed   By: Helyn Numbers MD   On: 10/24/2020 01:38    Procedures Procedures   Medications Ordered in ED Medications  naproxen (NAPROSYN) tablet 500 mg (500 mg Oral Given 10/24/20 1249)  acetaminophen (TYLENOL) tablet 650 mg (650 mg Oral Given 10/24/20 1250)  methocarbamol (ROBAXIN) tablet 500 mg (500 mg Oral Given 10/24/20 1250)    ED Course  I have reviewed the triage vital signs and the nursing notes.  Pertinent labs & imaging results that were available during my care of the patient were  reviewed by me and considered in my medical decision making (see chart for details).    MDM Rules/Calculators/A&P                          30 year old male comes in a chief complaint of chest pain, shoulder pain and some left upper extremity numbness.  I think all of the symptoms are likely related to the known history of rotator cuff/labrum injury followed by the fall patient had a week ago.  My suspicion is that he is having pain in his shoulder, edema leading to impingement syndrome type symptoms and spasms to his chest.   Delta troponin is negative.  ACS clinical suspicion was low to begin with. Well score 0, PERC negative.  Patient is not having pleuritic pain.  No C-spine tenderness.  Plan is to treat conservatively with RICE and have patient follow-up with orthopedist.  Final Clinical Impression(s) / ED Diagnoses Final diagnoses:  Spasm  Shoulder tendinitis, left  Impingement syndrome of left shoulder    Rx / DC Orders ED Discharge Orders         Ordered    acetaminophen (TYLENOL 8 HOUR) 650 MG CR tablet  Every 8 hours        10/24/20 1212    naproxen (NAPROSYN) 500 MG tablet  2 times daily        10/24/20 1212    methocarbamol (ROBAXIN) 500 MG tablet  2 times daily        10/24/20 1212           Derwood Kaplan, MD 10/24/20 1300

## 2020-10-24 NOTE — Discharge Instructions (Addendum)
Start the conservative  treatment as requested (RICE) immediately. See the Orthopedist in 7-10 days. Consider taking antacids for the the next 2-3 days in case GERD is contributing.

## 2020-10-24 NOTE — ED Provider Notes (Signed)
Emergency Medicine Provider Triage Evaluation Note  Jacob Nixon. , a 30 y.o. male  was evaluated in triage.  Pt complains of chest pain.  Reports for the past 3 days he has been having pain in his left upper chest.  Also reports pain in his left shoulder after catching it during the fall a week ago.  Some intermittent shortness of breath.  No history of cardiac issues.  Reports he is also started to notice some numbness and tingling in his left arm.  Review of Systems  Positive: Chest pain, left shoulder pain, numbness, tingling Negative: Cough, shortness of breath, fever, syncope  Physical Exam  BP 126/87 (BP Location: Left Arm)   Pulse 82   Temp 97.9 F (36.6 C) (Oral)   Resp 17   Ht 5\' 11"  (1.803 m)   Wt 117.9 kg   SpO2 100%   BMI 36.26 kg/m  Gen:   Awake, no distress   Resp:  Normal effort  MSK:   Moves extremities without difficulty  Other:  Some tenderness over the left chest wall and into the left shoulder  Medical Decision Making  Medically screening exam initiated at 10:48 AM.  Appropriate orders placed.  . was informed that the remainder of the evaluation will be completed by another provider, this initial triage assessment does not replace that evaluation, and the importance of remaining in the ED until their evaluation is complete.     Jacob Moras, PA-C 10/24/20 1052    12/24/20, MD 10/24/20 1254

## 2020-10-24 NOTE — ED Triage Notes (Signed)
Patient is complaining of mid chest pain that started 10/22/20. Patient also complaining of left shoulder pain after trying to catch his fall a week ago.

## 2021-02-07 ENCOUNTER — Encounter (HOSPITAL_COMMUNITY): Payer: Self-pay | Admitting: Emergency Medicine

## 2021-02-07 ENCOUNTER — Ambulatory Visit (HOSPITAL_COMMUNITY)
Admission: EM | Admit: 2021-02-07 | Discharge: 2021-02-07 | Disposition: A | Discharge status: Home / Self Care | Payer: BC Managed Care – PPO | Attending: Emergency Medicine | Admitting: Emergency Medicine

## 2021-02-07 ENCOUNTER — Other Ambulatory Visit: Payer: Self-pay

## 2021-02-07 DIAGNOSIS — J209 Acute bronchitis, unspecified: Secondary | ICD-10-CM

## 2021-02-07 MED ORDER — ALBUTEROL SULFATE HFA 108 (90 BASE) MCG/ACT IN AERS: 1.0000 | INHALATION_SPRAY | Freq: Four times a day (QID) | RESPIRATORY_TRACT | 0 refills | Status: DC | PRN

## 2021-02-07 MED ORDER — AZITHROMYCIN 250 MG PO TABS: 250.0000 mg | ORAL_TABLET | Freq: Every day | ORAL | 0 refills | Status: DC

## 2021-02-07 MED ORDER — PROMETHAZINE-DM 6.25-15 MG/5ML PO SYRP: 5.0000 mL | ORAL_SOLUTION | Freq: Four times a day (QID) | ORAL | 0 refills | Status: DC | PRN

## 2021-02-07 NOTE — ED Triage Notes (Addendum)
Patient c/o nonproductive cough and sore throat x 10 days.   Patient endorses SOB at times.   Patient endorses constant sore throat with no improvement through out the day.   Patient endorses onset of symptoms began with nasal congestion.   Patient took an at home COVID test with negative results.   Patient endorses cough worsens at night.   Patient has taken Mucinex, Sudafed, and prescribed cough "syrup" with no relief of symptoms.

## 2021-02-07 NOTE — ED Provider Notes (Signed)
[facility]    CSN: [id number] Arrival date & time: 02/07/21  1524      History   Chief Complaint Chief Complaint  Patient presents with   Cough   Sore Throat    HPI Jacob Nixon. is a 30 y.o. male.   Patient here for evaluation of cough, sore throat, and shortness of breath that has been ongoing for the past 10 days.  Reports initially had some nasal congestion but that has resolved at this time.  Reports doing a at home COVID test that was negative.  Reports cough is worse at night.  Reports using OTC medication including Mucinex with minimal symptom relief.  Denies any trauma, injury, or other precipitating event.  Denies any specific alleviating or aggravating factors.  Denies any fevers, chest pain, N/V/D, numbness, tingling, weakness, abdominal pain, or headaches.    The history is provided by the patient.  Cough Associated symptoms: shortness of breath and sore throat   Sore Throat Associated symptoms include shortness of breath.   Past Medical History:  Diagnosis Date   Bronchitis    Cellulitis of right hand - RECURRENT    Kidney stones     Patient Active Problem List   Diagnosis Date Noted   Acute right flank pain 07/08/2020   Acute unilateral obstructive uropathy 07/02/2020   Obstructive uropathy 07/02/2020   Acute pyelonephritis 07/02/2020   Sepsis (HCC) 07/02/2020   STD exposure    Left anterior knee pain 10/13/2019   Cervical disc disorder with radiculopathy of cervical region 08/23/2019   Low back pain 08/23/2019   Nonallopathic lesion of lumbosacral region 08/23/2019   Nonallopathic lesion of sacral region 08/23/2019   Nonallopathic lesion of thoracic region 08/23/2019   Labral tear of shoulder 12/15/2018   Arthritis of left acromioclavicular joint 07/15/2018   Impingement syndrome of left shoulder 04/01/2018   Hyponatremia 10/26/2016   Hypokalemia 10/26/2016   Elevated glucose level    Arm swelling 10/15/2016   Prediabetes  10/15/2016   Cellulitis of hand 08/04/2016   Carpal tunnel syndrome on right 05/08/2016   Abscess of upper arm    Cellulitis 01/22/2016   Leukocytosis 01/22/2016   Neuropathy 01/22/2016   Nephrolithiasis 01/22/2016   Cellulitis of right hand    Cellulitis of forearm, right 07/30/2015    Past Surgical History:  Procedure Laterality Date   CARPAL TUNNEL RELEASE Left 04/09/2016   Procedure: CARPAL TUNNEL RELEASE;  Surgeon: Cammy Copa, MD;  Location: [facility];  Service: Orthopedics;  Laterality: Left;   CYSTOSCOPY WITH RETROGRADE PYELOGRAM, URETEROSCOPY AND STENT PLACEMENT Bilateral 07/02/2020   Procedure: CYSTOSCOPY WITH RETROGRADE PYELOGRAM, URETEROSCOPY AND STENT PLACEMENT POSSIBLY BILATERAL;  Surgeon: Jannifer Hick, MD;  Location: [facility];  Service: Urology;  Laterality: Bilateral;   CYSTOSCOPY/URETEROSCOPY/HOLMIUM LASER/STENT PLACEMENT Bilateral 07/06/2020   Procedure: CYSTOSCOPY/URETEROSCOPY/HOLMIUM LASER/LEFT STENT EXCHANGE, RIGHT STENT PLACEMENT;  Surgeon: Vanna Scotland, MD;  Location: [facility];  Service: Urology;  Laterality: Bilateral;   EXTRACORPOREAL SHOCK WAVE LITHOTRIPSY Left 06/22/2020   Procedure: EXTRACORPOREAL SHOCK WAVE LITHOTRIPSY (ESWL);  Surgeon: Vanna Scotland, MD;  Location: [facility];  Service: Urology;  Laterality: Left;   HAND SURGERY     KIDNEY STONE SURGERY     lithortripsy       Home Medications    Prior to Admission medications   Medication Sig Start Date End Date Taking? Authorizing Provider  albuterol (VENTOLIN HFA) 108 (90 Base) MCG/ACT inhaler Inhale 1-2 puffs into the lungs every 6 (six) hours  as needed for wheezing or shortness of breath. 02/07/21  Yes Ivette Loyal, NP  azithromycin (ZITHROMAX) 250 MG tablet Take 1 tablet (250 mg total) by mouth daily. Take first 2 tablets together, then 1 every day until finished. 02/07/21  Yes Ivette Loyal, NP  promethazine-dextromethorphan (PROMETHAZINE-DM) 6.25-15 MG/5ML syrup Take 5 mLs by mouth 4  (four) times daily as needed for cough. 02/07/21  Yes Ivette Loyal, NP  acetaminophen (TYLENOL 8 HOUR) 650 MG CR tablet Take 1 tablet (650 mg total) by mouth every 8 (eight) hours. 10/24/20   Derwood Kaplan, MD  doxycycline (VIBRA-TABS) 100 MG tablet Take 1 tablet (100 mg total) by mouth every 12 (twelve) hours. 07/06/20   Danford, Earl Lites, MD  methocarbamol (ROBAXIN) 500 MG tablet Take 1 tablet (500 mg total) by mouth 2 (two) times daily. 10/24/20   Derwood Kaplan, MD  naproxen (NAPROSYN) 500 MG tablet Take 1 tablet (500 mg total) by mouth 2 (two) times daily. 10/24/20   Derwood Kaplan, MD  ondansetron (ZOFRAN ODT) 4 MG disintegrating tablet Take 1 tablet (4 mg total) by mouth every 8 (eight) hours as needed. 07/02/20   Nita Sickle, MD  traZODone (DESYREL) 50 MG tablet Take 0.5-1 tablets (25-50 mg total) by mouth at bedtime as needed for sleep. 06/02/19   Judi Saa, DO    Family History Family History  Problem Relation Age of Onset   Hypertension Other    Diabetes Father    Healthy Mother     Social History Social History   Tobacco Use   Smoking status: Former    Types: Cigarettes   Smokeless tobacco: Never  Vaping Use   Vaping Use: Every day   Substances: Nicotine, Flavoring  Substance Use Topics   Alcohol use: Yes   Drug use: No     Allergies   Coconut flavor and Penicillins   Review of Systems Review of Systems  HENT:  Positive for sore throat.   Respiratory:  Positive for cough and shortness of breath.   All other systems reviewed and are negative.   Physical Exam Triage Vital Signs ED Triage Vitals  Enc Vitals Group     BP 02/07/21 1541 115/80     Pulse Rate 02/07/21 1541 93     Resp 02/07/21 1541 18     Temp 02/07/21 1541 99.3 F (37.4 C)     Temp Source 02/07/21 1541 Oral     SpO2 02/07/21 1541 97 %     Weight --      Height --      Head Circumference --      Peak Flow --      Pain Score 02/07/21 1538 5     Pain Loc --      Pain  Edu? --      Excl. in [place]? --    No data found.  Updated Vital Signs BP 115/80 (BP Location: Right Arm)   Pulse 93   Temp 99.3 F (37.4 C) (Oral)   Resp 18   SpO2 97%   Visual Acuity Right Eye Distance:   Left Eye Distance:   Bilateral Distance:    Right Eye Near:   Left Eye Near:    Bilateral Near:     Physical Exam Vitals and nursing note reviewed.  Constitutional:      General: He is not in acute distress.    Appearance: Normal appearance. He is not ill-appearing, toxic-appearing or diaphoretic.  HENT:  Head: Normocephalic and atraumatic.     Nose: Congestion present.     Mouth/Throat:     Mouth: Mucous membranes are moist.     Pharynx: Uvula midline. Pharyngeal swelling and posterior oropharyngeal erythema present.     Tonsils: No tonsillar exudate or tonsillar abscesses. 1+ on the right. 1+ on the left.  Eyes:     Conjunctiva/sclera: Conjunctivae normal.  Cardiovascular:     Rate and Rhythm: Normal rate and regular rhythm.     Pulses: Normal pulses.     Heart sounds: Normal heart sounds.  Pulmonary:     Effort: Pulmonary effort is normal.     Breath sounds: Normal breath sounds.  Abdominal:     General: Abdomen is flat.  Musculoskeletal:        General: Normal range of motion.     Cervical back: Normal range of motion.  Skin:    General: Skin is warm and dry.  Neurological:     General: No focal deficit present.     Mental Status: He is alert and oriented to person, place, and time.  Psychiatric:        Mood and Affect: Mood normal.     UC Treatments / Results  Labs (all labs ordered are listed, but only abnormal results are displayed) Labs Reviewed - No data to display  EKG   Radiology No results found.  Procedures Procedures (including critical care time)  Medications Ordered in UC Medications - No data to display  Initial Impression / Assessment and Plan / UC Course  I have reviewed the triage vital signs and the nursing  notes.  Pertinent labs & imaging results that were available during my care of the patient were reviewed by me and considered in my medical decision making (see chart for details).    Assessment negative for red flags or concerns.  This is likely acute bronchitis.  Will treat with azithromycin.  May use an inhaler as needed for coughing, shortness of breath, and wheezing.  Promethazine DM as needed for cough but instructed not to take this prior to driving as it can cause drowsiness.  Discussed conservative symptom management as described in discharge instructions.  Tylenol and/or ibuprofen as needed.  Encourage fluids and rest.  Follow-up as needed Final Clinical Impressions(s) / UC Diagnoses   Final diagnoses:  Acute bronchitis, unspecified organism     Discharge Instructions      Take the azithromycin as prescribed.    You can use the inhaler as needed for cough, shortness of breath, and wheezing.  You can take the Promethazine DM as needed for cough.  Do not take this prior to driving as it can make you sleepy.   You can take Tylenol and/or Ibuprofen as needed for fever reduction and pain relief.   For cough: honey 1/2 to 1 teaspoon (you can dilute the honey in water or another fluid).  You can also use guaifenesin and dextromethorphan for cough. You can use a humidifier for chest congestion and cough.  If you don't have a humidifier, you can sit in the bathroom with the hot shower running.     For sore throat: try warm salt water gargles, cepacol lozenges, throat spray, warm tea or water with lemon/honey, popsicles or ice, or OTC cold relief medicine for throat discomfort.    For congestion: take a daily anti-histamine like Zyrtec, Claritin, and a oral decongestant, such as pseudoephedrine.  You can also use Flonase 1-2 sprays in each nostril  daily.    It is important to stay hydrated: drink plenty of fluids (water, gatorade/powerade/pedialyte, juices, or teas) to keep your throat  moisturized and help further relieve irritation/discomfort.   Return or go to the Emergency Department if symptoms worsen or do not improve in the next few days.      ED Prescriptions     Medication Sig Dispense Auth. Provider   azithromycin (ZITHROMAX) 250 MG tablet Take 1 tablet (250 mg total) by mouth daily. Take first 2 tablets together, then 1 every day until finished. 6 tablet Ivette Loyal, NP   albuterol (VENTOLIN HFA) 108 (90 Base) MCG/ACT inhaler Inhale 1-2 puffs into the lungs every 6 (six) hours as needed for wheezing or shortness of breath. 18 g Ivette Loyal, NP   promethazine-dextromethorphan (PROMETHAZINE-DM) 6.25-15 MG/5ML syrup Take 5 mLs by mouth 4 (four) times daily as needed for cough. 118 mL Ivette Loyal, NP      PDMP not reviewed this encounter.   Ivette Loyal, NP 02/07/21 [phone]

## 2021-02-07 NOTE — Discharge Instructions (Addendum)
Take the azithromycin as prescribed.    You can use the inhaler as needed for cough, shortness of breath, and wheezing.  You can take the Promethazine DM as needed for cough.  Do not take this prior to driving as it can make you sleepy.   You can take Tylenol and/or Ibuprofen as needed for fever reduction and pain relief.   For cough: honey 1/2 to 1 teaspoon (you can dilute the honey in water or another fluid).  You can also use guaifenesin and dextromethorphan for cough. You can use a humidifier for chest congestion and cough.  If you don't have a humidifier, you can sit in the bathroom with the hot shower running.     For sore throat: try warm salt water gargles, cepacol lozenges, throat spray, warm tea or water with lemon/honey, popsicles or ice, or OTC cold relief medicine for throat discomfort.    For congestion: take a daily anti-histamine like Zyrtec, Claritin, and a oral decongestant, such as pseudoephedrine.  You can also use Flonase 1-2 sprays in each nostril daily.    It is important to stay hydrated: drink plenty of fluids (water, gatorade/powerade/pedialyte, juices, or teas) to keep your throat moisturized and help further relieve irritation/discomfort.   Return or go to the Emergency Department if symptoms worsen or do not improve in the next few days.

## 2021-05-15 ENCOUNTER — Ambulatory Visit (HOSPITAL_COMMUNITY)
Admission: EM | Admit: 2021-05-15 | Discharge: 2021-05-15 | Disposition: A | Discharge status: Home / Self Care | Payer: BC Managed Care – PPO

## 2021-05-15 ENCOUNTER — Other Ambulatory Visit: Payer: Self-pay

## 2021-05-15 ENCOUNTER — Ambulatory Visit
Admission: EM | Admit: 2021-05-15 | Discharge: 2021-05-15 | Disposition: A | Discharge status: Home / Self Care | Payer: BC Managed Care – PPO | Attending: Emergency Medicine | Admitting: Emergency Medicine

## 2021-05-15 ENCOUNTER — Encounter: Payer: Self-pay | Admitting: Emergency Medicine

## 2021-05-15 DIAGNOSIS — K0889 Other specified disorders of teeth and supporting structures: Secondary | ICD-10-CM | POA: Diagnosis present

## 2021-05-15 DIAGNOSIS — K047 Periapical abscess without sinus: Secondary | ICD-10-CM

## 2021-05-15 DIAGNOSIS — Z87891 Personal history of nicotine dependence: Secondary | ICD-10-CM | POA: Diagnosis not present

## 2021-05-15 DIAGNOSIS — R220 Localized swelling, mass and lump, head: Secondary | ICD-10-CM | POA: Diagnosis not present

## 2021-05-15 MED ORDER — CLINDAMYCIN HCL 300 MG PO CAPS: 300.0000 mg | ORAL_CAPSULE | Freq: Three times a day (TID) | ORAL | 0 refills | Status: DC

## 2021-05-15 MED ORDER — IBUPROFEN 800 MG PO TABS: 800.0000 mg | ORAL_TABLET | Freq: Three times a day (TID) | ORAL | 0 refills | Status: DC

## 2021-05-15 NOTE — ED Provider Notes (Signed)
Jacob Nixon    CSN: [id number] Arrival date & time: 05/15/21  1657      History   Chief Complaint Chief Complaint  Patient presents with   Facial Swelling    HPI Jacob Nixon. is a 30 y.o. male.  Patient presents with left lower facial swelling and pain x 1 week.  He reports pain with chewing.  His has a tooth in that area that is carious and "needs to be pulled"; he has not seen a dentist.  Denies fever, chills, rash, redness, bruising, or other symptoms.  Treatment attempted at home with ibuprofen, Tylenol, Aleve; none taken in the past 2 days.   The history is provided by the patient and medical records.   Past Medical History:  Diagnosis Date   Bronchitis    Cellulitis of right hand - RECURRENT    Kidney stones     Patient Active Problem List   Diagnosis Date Noted   Acute right flank pain 07/08/2020   Acute unilateral obstructive uropathy 07/02/2020   Obstructive uropathy 07/02/2020   Acute pyelonephritis 07/02/2020   Sepsis (HCC) 07/02/2020   STD exposure    Left anterior knee pain 10/13/2019   Cervical disc disorder with radiculopathy of cervical region 08/23/2019   Low back pain 08/23/2019   Nonallopathic lesion of lumbosacral region 08/23/2019   Nonallopathic lesion of sacral region 08/23/2019   Nonallopathic lesion of thoracic region 08/23/2019   Labral tear of shoulder 12/15/2018   Arthritis of left acromioclavicular joint 07/15/2018   Impingement syndrome of left shoulder 04/01/2018   Hyponatremia 10/26/2016   Hypokalemia 10/26/2016   Elevated glucose level    Arm swelling 10/15/2016   Prediabetes 10/15/2016   Cellulitis of hand 08/04/2016   Carpal tunnel syndrome on right 05/08/2016   Abscess of upper arm    Cellulitis 01/22/2016   Leukocytosis 01/22/2016   Neuropathy 01/22/2016   Nephrolithiasis 01/22/2016   Cellulitis of right hand    Cellulitis of forearm, right 07/30/2015    Past Surgical History:  Procedure Laterality  Date   CARPAL TUNNEL RELEASE Left 04/09/2016   Procedure: CARPAL TUNNEL RELEASE;  Surgeon: Cammy Copa, MD;  Location: [facility];  Service: Orthopedics;  Laterality: Left;   CYSTOSCOPY WITH RETROGRADE PYELOGRAM, URETEROSCOPY AND STENT PLACEMENT Bilateral 07/02/2020   Procedure: CYSTOSCOPY WITH RETROGRADE PYELOGRAM, URETEROSCOPY AND STENT PLACEMENT POSSIBLY BILATERAL;  Surgeon: Jannifer Hick, MD;  Location: [facility];  Service: Urology;  Laterality: Bilateral;   CYSTOSCOPY/URETEROSCOPY/HOLMIUM LASER/STENT PLACEMENT Bilateral 07/06/2020   Procedure: CYSTOSCOPY/URETEROSCOPY/HOLMIUM LASER/LEFT STENT EXCHANGE, RIGHT STENT PLACEMENT;  Surgeon: Vanna Scotland, MD;  Location: [facility];  Service: Urology;  Laterality: Bilateral;   EXTRACORPOREAL SHOCK WAVE LITHOTRIPSY Left 06/22/2020   Procedure: EXTRACORPOREAL SHOCK WAVE LITHOTRIPSY (ESWL);  Surgeon: Vanna Scotland, MD;  Location: [facility];  Service: Urology;  Laterality: Left;   HAND SURGERY     KIDNEY STONE SURGERY     lithortripsy       Home Medications    Prior to Admission medications   Medication Sig Start Date End Date Taking? Authorizing Provider  acetaminophen (TYLENOL 8 HOUR) 650 MG CR tablet Take 1 tablet (650 mg total) by mouth every 8 (eight) hours. 10/24/20  Yes Derwood Kaplan, MD  albuterol (VENTOLIN HFA) 108 (90 Base) MCG/ACT inhaler Inhale 1-2 puffs into the lungs every 6 (six) hours as needed for wheezing or shortness of breath. 02/07/21  Yes Ivette Loyal, NP  clindamycin (CLEOCIN) 300 MG capsule Take 1 capsule (  300 mg total) by mouth 3 (three) times daily. 05/15/21  Yes Sharion Balloon, NP  ibuprofen (ADVIL) 800 MG tablet Take 1 tablet (800 mg total) by mouth 3 (three) times daily. 05/15/21  Yes Sharion Balloon, NP  ondansetron (ZOFRAN ODT) 4 MG disintegrating tablet Take 1 tablet (4 mg total) by mouth every 8 (eight) hours as needed. 07/02/20   Rudene Re, MD  promethazine-dextromethorphan (PROMETHAZINE-DM) 6.25-15 MG/5ML  syrup Take 5 mLs by mouth 4 (four) times daily as needed for cough. 02/07/21   Pearson Forster, NP  traZODone (DESYREL) 50 MG tablet Take 0.5-1 tablets (25-50 mg total) by mouth at bedtime as needed for sleep. 06/02/19   Lyndal Pulley, DO    Family History Family History  Problem Relation Age of Onset   Hypertension Other    Diabetes Father    Healthy Mother     Social History Social History   Tobacco Use   Smoking status: Former    Types: Cigarettes   Smokeless tobacco: Never  Vaping Use   Vaping Use: Every day   Substances: Nicotine, Flavoring  Substance Use Topics   Alcohol use: Yes   Drug use: No     Allergies   Coconut flavor and Penicillins   Review of Systems Review of Systems  Constitutional:  Negative for chills and fever.  HENT:  Positive for facial swelling. Negative for ear pain, sore throat, trouble swallowing and voice change.   Respiratory:  Negative for cough and shortness of breath.   Cardiovascular:  Negative for chest pain and palpitations.  Gastrointestinal:  Negative for diarrhea and vomiting.  Skin:  Negative for color change and rash.  All other systems reviewed and are negative.   Physical Exam Triage Vital Signs ED Triage Vitals  Enc Vitals Group     BP      Pulse      Resp      Temp      Temp src      SpO2      Weight      Height      Head Circumference      Peak Flow      Pain Score      Pain Loc      Pain Edu?      Excl. in [place]?    No data found.  Updated Vital Signs BP 135/85 (BP Location: Left Arm)    Pulse 86    Temp 98.7 F (37.1 C) (Oral)    Resp 18    Ht 5\' 11"  (1.803 m)    Wt 260 lb (117.9 kg)    SpO2 99%    BMI 36.26 kg/m   Visual Acuity Right Eye Distance:   Left Eye Distance:   Bilateral Distance:    Right Eye Near:   Left Eye Near:    Bilateral Near:     Physical Exam Vitals and nursing note reviewed.  Constitutional:      General: He is not in acute distress.    Appearance: Normal appearance. He  is well-developed. He is not ill-appearing.  HENT:     Right Ear: Tympanic membrane normal.     Left Ear: Tympanic membrane normal.     Nose: Nose normal.     Mouth/Throat:     Mouth: Mucous membranes are moist.     Pharynx: Oropharynx is clear.      Comments: Tenderness and mild edema of face from ear to lower jaw.  Speech clear.  No difficulty swallowing.  Cardiovascular:     Rate and Rhythm: Normal rate and regular rhythm.     Heart sounds: Normal heart sounds.  Pulmonary:     Effort: Pulmonary effort is normal. No respiratory distress.     Breath sounds: Normal breath sounds.  Musculoskeletal:     Cervical back: Neck supple.  Skin:    General: Skin is warm and dry.  Neurological:     Mental Status: He is alert.  Psychiatric:        Mood and Affect: Mood normal.        Behavior: Behavior normal.     UC Treatments / Results  Labs (all labs ordered are listed, but only abnormal results are displayed) Labs Reviewed - No data to display  EKG   Radiology No results found.  Procedures Procedures (including critical care time)  Medications Ordered in UC Medications - No data to display  Initial Impression / Assessment and Plan / UC Course  I have reviewed the triage vital signs and the nursing notes.  Pertinent labs & imaging results that were available during my care of the patient were reviewed by me and considered in my medical decision making (see chart for details).   Dental abscess, facial swelling.  Left lower tooth carious and in poor repair.  No difficulty swallowing or breathing.  Mild facial edema on left lower side.  Treating with clindamycin and ibuprofen.  Education provided on dental abscess.  Dental resource guide provided and instructed patient to schedule an appointment with a dentist as soon as possible.  ED precautions discussed at length.  Patient agrees to plan of care.   Final Clinical Impressions(s) / UC Diagnoses   Final diagnoses:  Dental  abscess  Facial swelling     Discharge Instructions      Take the antibiotic and ibuprofen as prescribed.    A dental resource guide is attached.  Please call to make an appointment with a dentist as soon as possible.    Go to the emergency department if you have acute worsening symptoms.         ED Prescriptions     Medication Sig Dispense Auth. Provider   clindamycin (CLEOCIN) 300 MG capsule Take 1 capsule (300 mg total) by mouth 3 (three) times daily. 21 capsule Barkley Boards H, NP   ibuprofen (ADVIL) 800 MG tablet Take 1 tablet (800 mg total) by mouth 3 (three) times daily. 10 tablet Sharion Balloon, NP      I have reviewed the PDMP during this encounter.   Sharion Balloon, NP 05/15/21 1730

## 2021-05-15 NOTE — Discharge Instructions (Addendum)
Take the antibiotic and ibuprofen as prescribed.    A dental resource guide is attached.  Please call to make an appointment with a dentist as soon as possible.    Go to the emergency department if you have acute worsening symptoms.     

## 2021-05-15 NOTE — ED Triage Notes (Signed)
Patient c/o left side of face from left ear to jaw bone is swelling, very painful, hurts to talk and chew x 1 week.  Patient has taken Tylenol, Ibuprofen and Aleeve.  No apparent injury to the area.

## 2021-05-15 NOTE — ED Triage Notes (Signed)
Patient complains of left sided face swelling from left ear to jaw bone. Very painful, hurts to talk and chew x 1 week. Pt reports having a broken tooth on that side. Patient has taken Tylenol, Ibuprofen and Aleeve with no relief.

## 2021-05-16 ENCOUNTER — Emergency Department (HOSPITAL_COMMUNITY)
Admission: EM | Admit: 2021-05-16 | Discharge: 2021-05-16 | Disposition: A | Discharge status: Home / Self Care | Payer: BC Managed Care – PPO | Attending: Emergency Medicine | Admitting: Emergency Medicine

## 2021-05-16 ENCOUNTER — Encounter (HOSPITAL_COMMUNITY): Payer: Self-pay

## 2021-05-16 DIAGNOSIS — K047 Periapical abscess without sinus: Secondary | ICD-10-CM

## 2021-05-16 LAB — COMPREHENSIVE METABOLIC PANEL
ALT: 31 U/L (ref 0–44)
AST: 23 U/L (ref 15–41)
Albumin: 4 g/dL (ref 3.5–5.0)
Alkaline Phosphatase: 86 U/L (ref 38–126)
Anion gap: 7 (ref 5–15)
BUN: 12 mg/dL (ref 6–20)
CO2: 27 mmol/L (ref 22–32)
Calcium: 9.5 mg/dL (ref 8.9–10.3)
Chloride: 102 mmol/L (ref 98–111)
Creatinine, Ser: 0.92 mg/dL (ref 0.61–1.24)
GFR, Estimated: >60 mL/min (ref >60)
Glucose, Bld: 102 mg/dL — ABNORMAL HIGH (ref 70–99)
Potassium: 4 mmol/L (ref 3.5–5.1)
Sodium: 136 mmol/L (ref 135–145)
Total Bilirubin: 0.7 mg/dL (ref 0.3–1.2)
Total Protein: 8.3 g/dL — ABNORMAL HIGH (ref 6.5–8.1)

## 2021-05-16 LAB — CBC WITH DIFFERENTIAL/PLATELET
Abs Immature Granulocytes: 0.03 10*3/uL (ref 0.00–0.07)
Basophils Absolute: 0.1 10*3/uL (ref 0.0–0.1)
Basophils Relative: 1 %
Eosinophils Absolute: 0.2 10*3/uL (ref 0.0–0.5)
Eosinophils Relative: 2 %
HCT: 47.9 % (ref 39.0–52.0)
Hemoglobin: 15.9 g/dL (ref 13.0–17.0)
Immature Granulocytes: 0 %
Lymphocytes Relative: 33 %
Lymphs Abs: 4.1 10*3/uL — ABNORMAL HIGH (ref 0.7–4.0)
MCH: 29.1 pg (ref 26.0–34.0)
MCHC: 33.2 g/dL (ref 30.0–36.0)
MCV: 87.6 fL (ref 80.0–100.0)
Monocytes Absolute: 1 10*3/uL (ref 0.1–1.0)
Monocytes Relative: 8 %
Neutro Abs: 6.9 10*3/uL (ref 1.7–7.7)
Neutrophils Relative %: 56 %
Platelets: 349 10*3/uL (ref 150–400)
RBC: 5.47 MIL/uL (ref 4.22–5.81)
RDW: 13.4 % (ref 11.5–15.5)
WBC: 12.3 10*3/uL — ABNORMAL HIGH (ref 4.0–10.5)
nRBC: 0 % (ref 0.0–0.2)

## 2021-05-16 MED ORDER — KETOROLAC TROMETHAMINE 30 MG/ML IJ SOLN
30.0000 mg | Freq: Once | INTRAMUSCULAR | Status: AC
  Administered 2021-05-16: 01:00:00 30 mg via INTRAMUSCULAR
  Filled 2021-05-16: qty 1

## 2021-05-16 MED ORDER — CLINDAMYCIN HCL 300 MG PO CAPS
300.0000 mg | ORAL_CAPSULE | Freq: Once | ORAL | Status: AC
  Administered 2021-05-16: 01:00:00 300 mg via ORAL
  Filled 2021-05-16: qty 1

## 2021-05-16 NOTE — Discharge Instructions (Signed)
Take the antibiotics prescribed by the urgent care.  Please follow-up with the dentist listed.  The pain will start to improve as the antibiotics kick in.  Until then, continue alternating ibuprofen and Tylenol.

## 2021-05-16 NOTE — ED Provider Notes (Signed)
[facility] DEPT Provider Note   CSN: [id number] Arrival date & time: 05/15/21  2043     History   Chief Complaint Chief Complaint  Patient presents with   Facial Swelling    HPI Jacob Nixon. is a 30 y.o. male.  Patient presents to the emergency department with a dental complaint. Symptoms began 1 week ago. The patient has tried to alleviate pain with OTC meds.  Pain rated as severe, characterized as throbbing in nature and located left upper and lower rear molars. Patient denies fever, night sweats, chills, difficulty swallowing or opening mouth, SOB, nuchal rigidity or decreased ROM of neck.  Patient does not have a dentist and requests a resource guide at discharge.      HPI  Past Medical History:  Diagnosis Date   Bronchitis    Cellulitis of right hand - RECURRENT    Kidney stones     Patient Active Problem List   Diagnosis Date Noted   Acute right flank pain 07/08/2020   Acute unilateral obstructive uropathy 07/02/2020   Obstructive uropathy 07/02/2020   Acute pyelonephritis 07/02/2020   Sepsis ([place]) 07/02/2020   STD exposure    Left anterior knee pain 10/13/2019   Cervical disc disorder with radiculopathy of cervical region 08/23/2019   Low back pain 08/23/2019   Nonallopathic lesion of lumbosacral region 08/23/2019   Nonallopathic lesion of sacral region 08/23/2019   Nonallopathic lesion of thoracic region 08/23/2019   Labral tear of shoulder 12/15/2018   Arthritis of left acromioclavicular joint 07/15/2018   Impingement syndrome of left shoulder 04/01/2018   Hyponatremia 10/26/2016   Hypokalemia 10/26/2016   Elevated glucose level    Arm swelling 10/15/2016   Prediabetes 10/15/2016   Cellulitis of hand 08/04/2016   Carpal tunnel syndrome on right 05/08/2016   Abscess of upper arm    Cellulitis 01/22/2016   Leukocytosis 01/22/2016   Neuropathy 01/22/2016   Nephrolithiasis 01/22/2016   Cellulitis of right hand     Cellulitis of forearm, right 07/30/2015    Past Surgical History:  Procedure Laterality Date   CARPAL TUNNEL RELEASE Left 04/09/2016   Procedure: CARPAL TUNNEL RELEASE;  Surgeon: Meredith Pel, MD;  Location: [facility];  Service: Orthopedics;  Laterality: Left;   CYSTOSCOPY WITH RETROGRADE PYELOGRAM, URETEROSCOPY AND STENT PLACEMENT Bilateral 07/02/2020   Procedure: CYSTOSCOPY WITH RETROGRADE PYELOGRAM, URETEROSCOPY AND STENT PLACEMENT POSSIBLY BILATERAL;  Surgeon: Janith Lima, MD;  Location: [facility];  Service: Urology;  Laterality: Bilateral;   CYSTOSCOPY/URETEROSCOPY/HOLMIUM LASER/STENT PLACEMENT Bilateral 07/06/2020   Procedure: CYSTOSCOPY/URETEROSCOPY/HOLMIUM LASER/LEFT STENT EXCHANGE, RIGHT STENT PLACEMENT;  Surgeon: Hollice Espy, MD;  Location: [facility];  Service: Urology;  Laterality: Bilateral;   EXTRACORPOREAL SHOCK WAVE LITHOTRIPSY Left 06/22/2020   Procedure: EXTRACORPOREAL SHOCK WAVE LITHOTRIPSY (ESWL);  Surgeon: Hollice Espy, MD;  Location: [facility];  Service: Urology;  Laterality: Left;   HAND SURGERY     KIDNEY STONE SURGERY     lithortripsy        Home Medications    Prior to Admission medications   Medication Sig Start Date End Date Taking? Authorizing Provider  acetaminophen (TYLENOL 8 HOUR) 650 MG CR tablet Take 1 tablet (650 mg total) by mouth every 8 (eight) hours. 10/24/20   Varney Biles, MD  albuterol (VENTOLIN HFA) 108 (90 Base) MCG/ACT inhaler Inhale 1-2 puffs into the lungs every 6 (six) hours as needed for wheezing or shortness of breath. 02/07/21   Pearson Forster, NP  clindamycin (CLEOCIN)  300 MG capsule Take 1 capsule (300 mg total) by mouth 3 (three) times daily. 05/15/21   Mickie Bail, NP  ibuprofen (ADVIL) 800 MG tablet Take 1 tablet (800 mg total) by mouth 3 (three) times daily. 05/15/21   Mickie Bail, NP  ondansetron (ZOFRAN ODT) 4 MG disintegrating tablet Take 1 tablet (4 mg total) by mouth every 8 (eight) hours as needed. 07/02/20    Nita Sickle, MD  promethazine-dextromethorphan (PROMETHAZINE-DM) 6.25-15 MG/5ML syrup Take 5 mLs by mouth 4 (four) times daily as needed for cough. 02/07/21   Ivette Loyal, NP  traZODone (DESYREL) 50 MG tablet Take 0.5-1 tablets (25-50 mg total) by mouth at bedtime as needed for sleep. 06/02/19   Judi Saa, DO    Family History Family History  Problem Relation Age of Onset   Hypertension Other    Diabetes Father    Healthy Mother     Social History Social History   Tobacco Use   Smoking status: Former    Types: Cigarettes   Smokeless tobacco: Never  Vaping Use   Vaping Use: Every day   Substances: Nicotine, Flavoring  Substance Use Topics   Alcohol use: Yes   Drug use: No     Allergies   Coconut flavor and Penicillins   Review of Systems Review of Systems  Constitutional:  Negative for chills and fever.  HENT:  Positive for dental problem. Negative for drooling.   Neurological:  Negative for speech difficulty.  Psychiatric/Behavioral:  Positive for sleep disturbance.     Physical Exam Updated Vital Signs BP 110/73 (BP Location: Left Arm)    Pulse 82    Temp 98.8 F (37.1 C) (Oral)    Resp 18    Ht 5\' 11"  (1.803 m)    Wt 117.9 kg    SpO2 98%    BMI 36.26 kg/m   Physical Exam Physical Exam  Constitutional: Pt appears well-developed and well-nourished.  HENT:  Head: Normocephalic.  Right Ear: Tympanic membrane, external ear and ear canal normal.  Left Ear: Tympanic membrane, external ear and ear canal normal.  Nose: Nose normal. Right sinus exhibits no maxillary sinus tenderness and no frontal sinus tenderness. Left sinus exhibits no maxillary sinus tenderness and no frontal sinus tenderness.  Mouth/Throat: Uvula is midline, oropharynx is clear and moist and mucous membranes are normal. No oral lesions. No uvula swelling or lacerations. No oropharyngeal exudate, posterior oropharyngeal edema, posterior oropharyngeal erythema or tonsillar abscesses.   Poor dentition No gingival swelling, fluctuance or induration No gross abscess  No sublingual edema, tenderness to palpation, or sign of Ludwig's angina, or deep space infection Pain at left rear upper and lower molars with caries, no drainable abscess Eyes: Conjunctivae are normal. Pupils are equal, round, and reactive to light. Right eye exhibits no discharge. Left eye exhibits no discharge.  Neck: Normal range of motion. Neck supple.  No stridor Handling secretions without difficulty No nuchal rigidity No cervical lymphadenopathy Cardiovascular: Normal rate, regular rhythm and normal heart sounds.   Pulmonary/Chest: Effort normal. No respiratory distress.  Equal chest rise  Abdominal: Soft. Bowel sounds are normal. Pt exhibits no distension. There is no tenderness.  Lymphadenopathy: Pt has no cervical adenopathy.  Neurological: Pt is alert and oriented x 4  Skin: Skin is warm and dry.  Psychiatric: Pt has a normal mood and affect.  Nursing note and vitals reviewed.   ED Treatments / Results  Labs (all labs ordered are listed, but only abnormal  results are displayed) Labs Reviewed - No data to display  EKG    Radiology No results found.  Procedures Procedures (including critical care time)  Medications Ordered in ED Medications  ketorolac (TORADOL) 30 MG/ML injection 30 mg (has no administration in time range)  clindamycin (CLEOCIN) capsule 300 mg (has no administration in time range)     Initial Impression / Assessment and Plan / ED Course  I have reviewed the triage vital signs and the nursing notes.  Pertinent labs & imaging results that were available during my care of the patient were reviewed by me and considered in my medical decision making (see chart for details).        Patient with dentalgia.  No abscess requiring immediate incision and drainage.  Exam not concerning for Ludwig's angina or pharyngeal abscess.  Will treat with clinda as rxd by UC. Pt  instructed to follow-up with dentist.  Discussed return precautions. Pt safe for discharge.   Final Clinical Impressions(s) / ED Diagnoses   Final diagnoses:  Dental abscess    ED Discharge Orders     None        Montine Circle, PA-C 05/16/21 0013    Maudie Flakes, MD 05/16/21 [phone]

## 2021-08-22 ENCOUNTER — Emergency Department (HOSPITAL_COMMUNITY): Payer: Self-pay

## 2021-08-22 ENCOUNTER — Encounter (HOSPITAL_COMMUNITY): Payer: Self-pay | Admitting: Emergency Medicine

## 2021-08-22 ENCOUNTER — Inpatient Hospital Stay (HOSPITAL_COMMUNITY)
Admission: EM | Admit: 2021-08-22 | Discharge: 2021-08-25 | DRG: 603 | Disposition: A | Discharge status: Home / Self Care | Payer: Self-pay | Attending: Internal Medicine | Admitting: Internal Medicine

## 2021-08-22 DIAGNOSIS — M545 Low back pain, unspecified: Secondary | ICD-10-CM | POA: Diagnosis present

## 2021-08-22 DIAGNOSIS — Z79899 Other long term (current) drug therapy: Secondary | ICD-10-CM

## 2021-08-22 DIAGNOSIS — Z880 Allergy status to penicillin: Secondary | ICD-10-CM

## 2021-08-22 DIAGNOSIS — L0390 Cellulitis, unspecified: Secondary | ICD-10-CM | POA: Diagnosis present

## 2021-08-22 DIAGNOSIS — M5440 Lumbago with sciatica, unspecified side: Secondary | ICD-10-CM | POA: Diagnosis present

## 2021-08-22 DIAGNOSIS — Z87891 Personal history of nicotine dependence: Secondary | ICD-10-CM

## 2021-08-22 DIAGNOSIS — G8929 Other chronic pain: Secondary | ICD-10-CM | POA: Diagnosis present

## 2021-08-22 DIAGNOSIS — Z885 Allergy status to narcotic agent status: Secondary | ICD-10-CM

## 2021-08-22 DIAGNOSIS — A419 Sepsis, unspecified organism: Secondary | ICD-10-CM

## 2021-08-22 DIAGNOSIS — L03113 Cellulitis of right upper limb: Principal | ICD-10-CM | POA: Diagnosis present

## 2021-08-22 DIAGNOSIS — Z9102 Food additives allergy status: Secondary | ICD-10-CM

## 2021-08-22 HISTORY — DX: Cutaneous abscess of limb, unspecified: L02.419

## 2021-08-22 LAB — CBC WITH DIFFERENTIAL/PLATELET
Abs Immature Granulocytes: 0.02 10*3/uL (ref 0.00–0.07)
Basophils Absolute: 0 10*3/uL (ref 0.0–0.1)
Basophils Relative: 0 %
Eosinophils Absolute: 0.2 10*3/uL (ref 0.0–0.5)
Eosinophils Relative: 2 %
HCT: 46.3 % (ref 39.0–52.0)
Hemoglobin: 15.6 g/dL (ref 13.0–17.0)
Immature Granulocytes: 0 %
Lymphocytes Relative: 24 %
Lymphs Abs: 3 10*3/uL (ref 0.7–4.0)
MCH: 29.1 pg (ref 26.0–34.0)
MCHC: 33.7 g/dL (ref 30.0–36.0)
MCV: 86.2 fL (ref 80.0–100.0)
Monocytes Absolute: 1.1 10*3/uL — ABNORMAL HIGH (ref 0.1–1.0)
Monocytes Relative: 9 %
Neutro Abs: 7.8 10*3/uL — ABNORMAL HIGH (ref 1.7–7.7)
Neutrophils Relative %: 65 %
Platelets: 338 10*3/uL (ref 150–400)
RBC: 5.37 MIL/uL (ref 4.22–5.81)
RDW: 13.7 % (ref 11.5–15.5)
WBC: 12.1 10*3/uL — ABNORMAL HIGH (ref 4.0–10.5)
nRBC: 0 % (ref 0.0–0.2)

## 2021-08-22 LAB — LACTIC ACID, PLASMA
Lactic Acid, Venous: 0.9 mmol/L (ref 0.5–1.9)
Lactic Acid, Venous: 0.9 mmol/L (ref 0.5–1.9)

## 2021-08-22 LAB — URINALYSIS, ROUTINE W REFLEX MICROSCOPIC
Bacteria, UA: NONE SEEN
Bilirubin Urine: NEGATIVE
Glucose, UA: NEGATIVE mg/dL
Ketones, ur: NEGATIVE mg/dL
Leukocytes,Ua: NEGATIVE
Nitrite: NEGATIVE
Protein, ur: NEGATIVE mg/dL
Specific Gravity, Urine: 1.003 — ABNORMAL LOW (ref 1.005–1.030)
pH: 7 (ref 5.0–8.0)

## 2021-08-22 LAB — SEDIMENTATION RATE: Sed Rate: 22 mm/hr — ABNORMAL HIGH (ref 0–16)

## 2021-08-22 LAB — BASIC METABOLIC PANEL
Anion gap: 5 (ref 5–15)
BUN: 10 mg/dL (ref 6–20)
CO2: 25 mmol/L (ref 22–32)
Calcium: 8.8 mg/dL — ABNORMAL LOW (ref 8.9–10.3)
Chloride: 104 mmol/L (ref 98–111)
Creatinine, Ser: 0.87 mg/dL (ref 0.61–1.24)
GFR, Estimated: >60 mL/min (ref >60)
Glucose, Bld: 94 mg/dL (ref 70–99)
Potassium: 3.7 mmol/L (ref 3.5–5.1)
Sodium: 134 mmol/L — ABNORMAL LOW (ref 135–145)

## 2021-08-22 LAB — C-REACTIVE PROTEIN: CRP: 6.3 mg/dL — ABNORMAL HIGH (ref <1.0)

## 2021-08-22 MED ORDER — LACTATED RINGERS IV BOLUS (SEPSIS)
1000.0000 mL | Freq: Once | INTRAVENOUS | Status: AC
  Administered 2021-08-22: 1000 mL via INTRAVENOUS

## 2021-08-22 MED ORDER — SODIUM CHLORIDE 0.9 % IV SOLN
2.0000 g | INTRAVENOUS | Status: DC
  Administered 2021-08-22 – 2021-08-24 (×3): 2 g via INTRAVENOUS
  Filled 2021-08-22 (×3): qty 20

## 2021-08-22 MED ORDER — FENTANYL CITRATE PF 50 MCG/ML IJ SOSY
50.0000 ug | PREFILLED_SYRINGE | Freq: Once | INTRAMUSCULAR | Status: DC
  Filled 2021-08-22: qty 1

## 2021-08-22 MED ORDER — ONDANSETRON HCL 4 MG/2ML IJ SOLN
4.0000 mg | Freq: Four times a day (QID) | INTRAMUSCULAR | Status: AC | PRN
  Administered 2021-08-22 – 2021-08-24 (×3): 4 mg via INTRAVENOUS
  Filled 2021-08-22 (×3): qty 2

## 2021-08-22 MED ORDER — LACTATED RINGERS IV BOLUS
1000.0000 mL | Freq: Once | INTRAVENOUS | Status: AC
  Administered 2021-08-22: 1000 mL via INTRAVENOUS

## 2021-08-22 MED ORDER — ENOXAPARIN SODIUM 60 MG/0.6ML IJ SOSY
60.0000 mg | PREFILLED_SYRINGE | INTRAMUSCULAR | Status: DC
  Administered 2021-08-22 – 2021-08-24 (×3): 60 mg via SUBCUTANEOUS
  Filled 2021-08-22 (×3): qty 0.6

## 2021-08-22 MED ORDER — GADOBUTROL 1 MMOL/ML IV SOLN
10.0000 mL | Freq: Once | INTRAVENOUS | Status: AC | PRN
  Administered 2021-08-22: 10 mL via INTRAVENOUS

## 2021-08-22 MED ORDER — HYDROMORPHONE HCL 1 MG/ML IJ SOLN
1.0000 mg | INTRAMUSCULAR | Status: DC | PRN
  Administered 2021-08-22: 1 mg via INTRAVENOUS
  Filled 2021-08-22: qty 1

## 2021-08-22 MED ORDER — SODIUM CHLORIDE 0.9% FLUSH
3.0000 mL | Freq: Two times a day (BID) | INTRAVENOUS | Status: DC
  Administered 2021-08-22 – 2021-08-25 (×6): 3 mL via INTRAVENOUS

## 2021-08-22 MED ORDER — POLYETHYLENE GLYCOL 3350 17 G PO PACK: 17.0000 g | PACK | Freq: Every day | ORAL | Status: DC | PRN

## 2021-08-22 MED ORDER — VANCOMYCIN HCL 500 MG/100ML IV SOLN
500.0000 mg | Freq: Once | INTRAVENOUS | Status: AC
  Administered 2021-08-22: 500 mg via INTRAVENOUS
  Filled 2021-08-22: qty 100

## 2021-08-22 MED ORDER — MORPHINE SULFATE (PF) 4 MG/ML IV SOLN
4.0000 mg | Freq: Once | INTRAVENOUS | Status: AC
  Administered 2021-08-22: 4 mg via INTRAVENOUS
  Filled 2021-08-22: qty 1

## 2021-08-22 MED ORDER — ACETAMINOPHEN 325 MG PO TABS: 650.0000 mg | ORAL_TABLET | Freq: Four times a day (QID) | ORAL | Status: DC | PRN

## 2021-08-22 MED ORDER — HYDROMORPHONE HCL 1 MG/ML IJ SOLN
0.5000 mg | INTRAMUSCULAR | Status: DC | PRN
  Administered 2021-08-22 – 2021-08-25 (×10): 1 mg via INTRAVENOUS
  Filled 2021-08-22 (×10): qty 1

## 2021-08-22 MED ORDER — HYDROCODONE-ACETAMINOPHEN 5-325 MG PO TABS
1.0000 | ORAL_TABLET | Freq: Four times a day (QID) | ORAL | Status: DC | PRN
  Administered 2021-08-22 – 2021-08-25 (×7): 2 via ORAL
  Filled 2021-08-22 (×7): qty 2

## 2021-08-22 MED ORDER — VANCOMYCIN HCL 2000 MG/400ML IV SOLN
2000.0000 mg | Freq: Once | INTRAVENOUS | Status: AC
  Administered 2021-08-22: 2000 mg via INTRAVENOUS
  Filled 2021-08-22: qty 400

## 2021-08-22 MED ORDER — ACETAMINOPHEN 500 MG PO TABS
1000.0000 mg | ORAL_TABLET | Freq: Once | ORAL | Status: AC
  Administered 2021-08-22: 1000 mg via ORAL
  Filled 2021-08-22: qty 2

## 2021-08-22 MED ORDER — LACTATED RINGERS IV SOLN: INTRAVENOUS | Status: AC

## 2021-08-22 MED ORDER — PIPERACILLIN-TAZOBACTAM 3.375 G IVPB 30 MIN
3.3750 g | Freq: Once | INTRAVENOUS | Status: AC
  Administered 2021-08-22: 3.375 g via INTRAVENOUS
  Filled 2021-08-22: qty 50

## 2021-08-22 MED ORDER — CLINDAMYCIN PHOSPHATE 600 MG/50ML IV SOLN: 600.0000 mg | Freq: Three times a day (TID) | INTRAVENOUS | Status: DC

## 2021-08-22 MED ORDER — MORPHINE SULFATE (PF) 4 MG/ML IV SOLN
4.0000 mg | Freq: Once | INTRAVENOUS | Status: AC
Start: 2021-08-22 — End: 2021-08-22
  Administered 2021-08-22: 4 mg via INTRAVENOUS
  Filled 2021-08-22: qty 1

## 2021-08-22 MED ORDER — ACETAMINOPHEN 650 MG RE SUPP: 650.0000 mg | Freq: Four times a day (QID) | RECTAL | Status: DC | PRN

## 2021-08-22 NOTE — ED Provider Notes (Signed)
?[facility]-EMERGENCY DEPT ?Provider Note ? ? ?CSN: [id number] ?Arrival date & time: 08/22/21  1220 ? ?  ? ?History ? ?Chief Complaint  ?Patient presents with  ? Hand Pain  ? ? ?Jacob Morasdrian L Stubblefield Jr. is a 31 y.o. male. ? ? ?Hand Pain ? ? ?31 year old right-handed male with a history of recurrent episodes of right hand cellulitis presenting to the emergency department with a chief complaint of hand pain, redness and swelling. ? ?The patient states that he has had a low back ache for the past two weeks, but over the last two days it has worsened. Endorses new pain down his left leg. No urinary or fecal incontinence. No weakness in the lower extremities. Significant pain with ROM and palpation of the midline of the lower back.  ? ?Also endorses right hand swelling. He is right handed. Swelling has been ongoing for the past two days. Endorses new tingling in his left hand and pain.  ?Home Medications ?Prior to Admission medications   ?Medication Sig Start Date End Date Taking? Authorizing Provider  ?acetaminophen (TYLENOL 8 HOUR) 650 MG CR tablet Take 1 tablet (650 mg total) by mouth every 8 (eight) hours. 10/24/20   Derwood KaplanNanavati, Ankit, MD  ?albuterol (VENTOLIN HFA) 108 (90 Base) MCG/ACT inhaler Inhale 1-2 puffs into the lungs every 6 (six) hours as needed for wheezing or shortness of breath. 02/07/21   Ivette LoyalSmith, Fowler R, NP  ?clindamycin (CLEOCIN) 300 MG capsule Take 1 capsule (300 mg total) by mouth 3 (three) times daily. 05/15/21   Mickie Bailate, Kelly H, NP  ?ibuprofen (ADVIL) 800 MG tablet Take 1 tablet (800 mg total) by mouth 3 (three) times daily. 05/15/21   Mickie Bailate, Kelly H, NP  ?ondansetron (ZOFRAN ODT) 4 MG disintegrating tablet Take 1 tablet (4 mg total) by mouth every 8 (eight) hours as needed. 07/02/20   Nita SickleVeronese, Lincoln, MD  ?promethazine-dextromethorphan (PROMETHAZINE-DM) 6.25-15 MG/5ML syrup Take 5 mLs by mouth 4 (four) times daily as needed for cough. 02/07/21   Ivette LoyalSmith, Fowler R, NP  ?traZODone (DESYREL) 50  MG tablet Take 0.5-1 tablets (25-50 mg total) by mouth at bedtime as needed for sleep. 06/02/19   Judi SaaSmith, Zachary M, DO  ?   ? ?Allergies    ?Coconut flavor and Penicillins   ? ?Review of Systems   ?Review of Systems  ?All other systems reviewed and are negative. ? ?Physical Exam ?Updated Vital Signs ?BP (!) 147/85   Pulse 83   Temp 98.2 ?F (36.8 ?C) (Oral)   Resp 17   Ht 5\' 11"  (1.803 m)   Wt 115.7 kg   SpO2 98%   BMI 35.57 kg/m?  ?Physical Exam ?Vitals and nursing note reviewed.  ?Constitutional:   ?   General: He is not in acute distress. ?HENT:  ?   Head: Normocephalic and atraumatic.  ?Eyes:  ?   Conjunctiva/sclera: Conjunctivae normal.  ?   Pupils: Pupils are equal, round, and reactive to light.  ?Cardiovascular:  ?   Rate and Rhythm: Normal rate and regular rhythm.  ?Pulmonary:  ?   Effort: Pulmonary effort is normal. No respiratory distress.  ?Abdominal:  ?   General: There is no distension.  ?   Tenderness: There is no guarding.  ?Musculoskeletal:     ?   General: Tenderness present. No deformity or signs of injury.  ?   Cervical back: Neck supple.  ?   Comments: Significant midline tenderness to palpation of the lower lumbar spine. Right hand swollen,  erythematous, tender to palpation. 2+ radial pulses, motor function intact along the median, ulnar, radial nerve distributions.   ?Skin: ?   Findings: No lesion or rash.  ?Neurological:  ?   General: No focal deficit present.  ?   Mental Status: He is alert. Mental status is at baseline.  ? ? ?ED Results / Procedures / Treatments   ?Labs ?(all labs ordered are listed, but only abnormal results are displayed) ?Labs Reviewed  ?CBC WITH DIFFERENTIAL/PLATELET - Abnormal; Notable for the following components:  ?    Result Value  ? WBC 12.1 (*)   ? Neutro Abs 7.8 (*)   ? Monocytes Absolute 1.1 (*)   ? All other components within normal limits  ?BASIC METABOLIC PANEL - Abnormal; Notable for the following components:  ? Sodium 134 (*)   ? Calcium 8.8 (*)   ? All  other components within normal limits  ?SEDIMENTATION RATE - Abnormal; Notable for the following components:  ? Sed Rate 22 (*)   ? All other components within normal limits  ?CULTURE, BLOOD (SINGLE)  ?CULTURE, BLOOD (ROUTINE X 2)  ?CULTURE, BLOOD (ROUTINE X 2)  ?URINE CULTURE  ?LACTIC ACID, PLASMA  ?C-REACTIVE PROTEIN  ?URINALYSIS, ROUTINE W REFLEX MICROSCOPIC  ?LACTIC ACID, PLASMA  ? ? ?EKG ?None ? ?Radiology ?CT Lumbar Spine Wo Contrast ? ?Result Date: 08/22/2021 ?CLINICAL DATA:  Low back pain radiating down the left leg. EXAM: CT LUMBAR SPINE WITHOUT CONTRAST TECHNIQUE: Multidetector CT imaging of the lumbar spine was performed without intravenous contrast administration. Multiplanar CT image reconstructions were also generated. RADIATION DOSE REDUCTION: This exam was performed according to the departmental dose-optimization program which includes automated exposure control, adjustment of the mA and/or kV according to patient size and/or use of iterative reconstruction technique. COMPARISON:  CT abdomen and pelvis 07/08/2020 FINDINGS: Segmentation: 5 lumbar type vertebrae. Alignment: Normal. Vertebrae: No acute fracture or suspicious osseous lesion. Paraspinal and other soft tissues: Unremarkable. Disc levels: T12-L1 and L1-2: Negative. L2-3: A left foraminal to extraforaminal disc osteophyte complex has significantly increased in size and results in mild-to-moderate left neural foraminal stenosis and may affect the left L2 nerve lateral to the foramen. Patent spinal canal and right neural foramen. L3-4: Minimal disc bulging and endplate spurring without evidence of significant stenosis. L4-5: Mild disc bulging result in likely mild left lateral recess stenosis and mild bilateral neural foraminal stenosis without significant spinal stenosis. L5-S1: Minimal disc bulging without evidence of significant stenosis. IMPRESSION: 1. Mild-to-moderate left neural foraminal stenosis at L2-3 due to a disc osteophyte complex.  2. Mild bilateral neural foraminal and left lateral recess stenosis at L4-5. Electronically Signed   By: Sebastian Ache M.D.   On: 08/22/2021 13:45  ? ?DG Hand Complete Right ? ?Result Date: 08/22/2021 ?CLINICAL DATA:  Hand pain and swelling, diagnosed with cellulitis with numbness and tingling. EXAM: RIGHT HAND - COMPLETE 3+ VIEW COMPARISON:  February 02, 2019. FINDINGS: Degenerative changes, mild about the wrist and hand similar to prior imaging in the wrist. No sign of acute fracture or dislocation. No acute bony abnormality. Diffuse soft tissue swelling in the hand marked soft tissue swelling over the volar and dorsal aspect of the hand. IMPRESSION: Diffuse soft tissue swelling about the hand without acute bony abnormality. Correlate with signs of infection/cellulitis. Swelling more extensive than expected for simple cellulitis. Electronically Signed   By: Donzetta Kohut M.D.   On: 08/22/2021 13:38   ? ?Procedures ?Ultrasound ED Soft Tissue ? ?Date/Time: 08/22/2021 4:24 PM ?  Performed by: Ernie Avena, MD ?Authorized by: Ernie Avena, MD  ? ?Procedure details:  ?  Indications: limb pain and evaluate for cellulitis   ?  Transverse view:  Visualized ?  Longitudinal view:  Visualized ?  Images: not archived   ?Location:  ?  Location: upper extremity   ?  Side:  Right ?Findings:  ?   no abscess present ?   cellulitis present ?.Critical Care ?Performed by: Ernie Avena, MD ?Authorized by: Ernie Avena, MD  ? ?Critical care provider statement:  ?  Critical care time (minutes):  30 ?  Critical care was necessary to treat or prevent imminent or life-threatening deterioration of the following conditions:  Sepsis ?  Critical care was time spent personally by me on the following activities:  Development of treatment plan with patient or surrogate, examination of patient, evaluation of patient's response to treatment, obtaining history from patient or surrogate, ordering and performing treatments and interventions,  ordering and review of laboratory studies, ordering and review of radiographic studies, pulse oximetry, re-evaluation of patient's condition and review of old charts  ? ? ?Medications Ordered in ED ?Medications  ?

## 2021-08-22 NOTE — ED Notes (Signed)
Patient transported to MRI. Will start medications when patient returns. ?

## 2021-08-22 NOTE — H&P (Signed)
?History and Physical  ? ?Jacob MorasAdrian L Draughn Jr. AVW:[id number] DOB: January 06, 1991 DOA: 08/22/2021 ? ?PCP: Patient, No Pcp Per (Inactive)  ? ?Patient coming from: Home ? ?Chief Complaint: Cellulitis, back pain ? ?HPI: Jacob Morasdrian L Piloto Jr. is a 31 y.o. male with medical history significant of recurrent cellulitis, neuropathy, renal stones, cervical disc disease, low back pain, obstructive uropathy presenting with ongoing back pain and recurrent cellulitis. ? ?Patient presenting with recurrent cellulitis of his right hand.  Has had to follow-up in the past admitted to date of swelling and redness.  Also reports some tingling in his left hand.  Additionally he has had chronic back pain with worsening backache for the past 2 weeks.  Significantly worse for the past 2 days.  Now with some pain going down his left leg.  No urinary or fecal incontinence, no weakness.  Significant pain of the back on palpation or with movement. ? ?Has had previous episodes of cellulitis of the hand work-up.  Most recent was 5 years ago.  Was initially seen here and then [facility].  States he has had multiple imaging studies as well as a work-up for rheumatoid arthritis and lupus which was all negative.  He was to have a biopsy but he lost his insurance and this was never done.  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 ? ?He denies fevers, chills, chest pain, shortness of breath, abdominal pain, constipation, diarrhea, nausea, vomiting. ? ?ED Course: Vital signs in ED significant for heart rate in the 80s to 100s, blood pressure in the 120s to 150 systolic.  Lab work-up with BMP with sodium 134, calcium 8.8.  CBC with leukocytosis of 12.1.  Sed rate 22, CRP 6.3, lactic acid normal.  Repeat pending.  Urinalysis with hemoglobin only, urine cultures and blood cultures pending.   Right hand x-ray with soft tissue swelling but no bony abnormality, consistent with possible cellulitis though degree of swelling seems out of proportion.  CT of the L-spine with moderate left foraminal stenosis at L2-L3 and mild left stenosis at L4-L5.  MRI of the L-spine was negative for discitis or abscess.  Did show inflammation at the anterior spinous and paraspinous soft tissues at the L4/L5 level.  Patient received vancomycin and Zosyn in the ED as well as morphine and Dilaudid.  Patient received 2 L of IV fluid and was started on a rate of IV fluids.  EDP spoke with hand surgery on-call who agreed based on imaging likely cellulitis and state to reconsult if needed. ? ?Review of Systems: As per HPI otherwise all other systems reviewed and are negative. ? ?Past Medical History:  ?Diagnosis Date  ? Abscess of upper arm   ? Bronchitis   ? Cellulitis of forearm, right 07/30/2015  ? Cellulitis of hand 08/04/2016  ? Cellulitis of right hand   ? Cellulitis of right hand - RECURRENT   ? Kidney stones   ? ? ?Past Surgical History:  ?Procedure Laterality Date  ? CARPAL TUNNEL RELEASE Left 04/09/2016  ? Procedure: CARPAL TUNNEL RELEASE;  Surgeon: Cammy CopaScott Gregory [name], MD;  [facility];  Service: Orthopedics;  Laterality: Left;  ? CYSTOSCOPY WITH RETROGRADE PYELOGRAM, URETEROSCOPY AND STENT PLACEMENT Bilateral 07/02/2020  ? Procedure: CYSTOSCOPY WITH RETROGRADE PYELOGRAM, URETEROSCOPY AND STENT PLACEMENT POSSIBLY BILATERAL;  Surgeon: Jannifer HickGay, Matthew R,  MD;  Location: [facility] ORS;  Service: Urology;  Laterality: Bilateral;  ? CYSTOSCOPY/URETEROSCOPY/HOLMIUM LASER/STENT PLACEMENT Bilateral 07/06/2020  ? Procedure: CYSTOSCOPY/URETEROSCOPY/HOLMIUM LASER/LEFT STENT EXCHANGE, RIGHT STENT PLACEMENT;  Surgeon: Vanna Scotland, MD;  Location: [facility] ORS;  Service: Urology;  Laterality: Bilateral;  ? EXTRACORPOREAL SHOCK WAVE LITHOTRIPSY Left 06/22/2020  ? Procedure: EXTRACORPOREAL SHOCK WAVE LITHOTRIPSY (ESWL);  Surgeon: Vanna Scotland, MD;   Location: [facility] ORS;  Service: Urology;  Laterality: Left;  ? HAND SURGERY    ? KIDNEY STONE SURGERY    ? lithortripsy  ? ? ?Social History ? reports that he has quit smoking. His smoking use included cigarettes. He has never used smokeless tobacco. He reports current alcohol use. He reports that he does not use drugs. ? ?Allergies  ?Allergen Reactions  ? Fentanyl Other (See Comments)  ?  Patient prefers to NOT take this  ? Coconut Flavor Other (See Comments)  ?  Scratchy throat  ? Penicillins Swelling and Other (See Comments)  ?  DID THE REACTION INVOLVE: Swelling of the face/tongue/throat, SOB, or low BP? Y ?Sudden or severe rash/hives, skin peeling, or the inside of the mouth or nose? N  ?Did it require medical treatment? Y ?When did it last happen?   January 2020 ?If all above answers are "NO", may proceed with cephalosporin use. ?  ? ? ?Family History  ?Problem Relation Age of Onset  ? Hypertension Other   ? Diabetes Father   ? Healthy Mother   ?Reviewed on admission ? ?Prior to Admission medications   ?Medication Sig Start Date End Date Taking? Authorizing Provider  ?naproxen sodium (ALEVE) 220 MG tablet Take 220-440 mg by mouth 2 (two) times daily as needed (for mild pain or headaches).   Yes [provider]  ?ZYRTEC ALLERGY 10 MG tablet Take 10 mg by mouth daily as needed for allergies or rhinitis.   Yes [provider]  ?acetaminophen (TYLENOL 8 HOUR) 650 MG CR tablet Take 1 tablet (650 mg total) by mouth every 8 (eight) hours. ?Patient not taking: Reported on 08/22/2021 10/24/20   Derwood Kaplan, MD  ?ibuprofen (ADVIL) 800 MG tablet Take 1 tablet (800 mg total) by mouth 3 (three) times daily. ?Patient not taking: Reported on 08/22/2021 05/15/21   Mickie Bail, NP  ? ? ?Physical Exam: ?Vitals:  ? 08/22/21 1730 08/22/21 1800 08/22/21 1915 08/22/21 1959  ?BP: 110/81 132/74 109/80 109/84  ?Pulse: 83 91 94 88  ?Resp: 16 18 18 18   ?Temp:      ?TempSrc:      ?SpO2: 97% 97% 100% 100%  ?Weight:       ?Height:      ? ? ?Physical Exam ?Constitutional:   ?   General: He is not in acute distress. ?   Appearance: Normal appearance.  ?HENT:  ?   Head: Normocephalic and atraumatic.  ?   Mouth/Throat:  ?   Mouth: Mucous membranes are moist.  ?   Pharynx: Oropharynx is clear.  ?Eyes:  ?   Extraocular Movements: Extraocular movements intact.  ?   Pupils: Pupils are equal, round, and reactive to light.  ?Cardiovascular:  ?   Rate and Rhythm: Normal rate and regular rhythm.  ?   Pulses: Normal pulses.  ?   Heart sounds: Normal heart sounds.  ?Pulmonary:  ?   Effort: Pulmonary effort is normal. No respiratory distress.  ?   Breath sounds: Normal breath sounds.  ?Abdominal:  ?   General: Bowel sounds are normal. There is  no distension.  ?   Palpations: Abdomen is soft.  ?   Tenderness: There is no abdominal tenderness.  ?Musculoskeletal:     ?   General: Swelling and tenderness present. No deformity.  ?   Comments: Right hand with erythema, edema, warmth ?Low back with significant tenderness  ?Skin: ?   General: Skin is warm and dry.  ?Neurological:  ?   General: No focal deficit present.  ?   Mental Status: Mental status is at baseline.  ? ?Labs on Admission: I have personally reviewed following labs and imaging studies ? ?CBC: ?Recent Labs  ?Lab 08/22/21 ?1316  ?WBC 12.1*  ?NEUTROABS 7.8*  ?HGB 15.6  ?HCT 46.3  ?MCV 86.2  ?PLT 338  ? ? ?Basic Metabolic Panel: ?Recent Labs  ?Lab 08/22/21 ?1316  ?NA 134*  ?K 3.7  ?CL 104  ?CO2 25  ?GLUCOSE 94  ?BUN 10  ?CREATININE 0.87  ?CALCIUM 8.8*  ? ? ?GFR: ?Estimated Creatinine Clearance: 159.2 mL/min (by C-G formula based on SCr of 0.87 mg/dL). ? ?Liver Function Tests: ?No results for input(s): AST, ALT, ALKPHOS, BILITOT, PROT, ALBUMIN in the last 168 hours. ? ?Urine analysis: ?   ?Component Value Date/Time  ? COLORURINE COLORLESS (A) 08/22/2021 1421  ? APPEARANCEUR CLEAR 08/22/2021 1421  ? APPEARANCEUR Cloudy (A) 06/15/2020 1411  ? LABSPEC 1.003 (L) 08/22/2021 1421  ? PHURINE 7.0  08/22/2021 1421  ? GLUCOSEU NEGATIVE 08/22/2021 1421  ? HGBUR SMALL (A) 08/22/2021 1421  ? BILIRUBINUR NEGATIVE 08/22/2021 1421  ? BILIRUBINUR Negative 06/15/2020 1411  ? KETONESUR NEGATIVE 08/22/2021 1421

## 2021-08-22 NOTE — ED Triage Notes (Signed)
Per patient, states right hand swelling and pain-states this has happened before and was diagnosed with cellulitis-states numbness and tingling  ?

## 2021-08-22 NOTE — ED Notes (Signed)
Pt transported to CT ?

## 2021-08-22 NOTE — ED Provider Notes (Signed)
Cellulitis dorsum of right hand. ?Acute worsening of chronic low back pain with radiculopathy and midline tenderness.  Patient denies IV drug abuse. ? ?MRI back to rule out epidural abscess. ?Anticipated admission for sepsis secondary to cellulitis hand ?Patient was started empirically on vancomycin ?Physical Exam  ?BP 134/84   Pulse 91   Temp 98.2 ?F (36.8 ?C) (Oral)   Resp 18   Ht 5\' 11"  (1.803 m)   Wt 115.7 kg   SpO2 99%   BMI 35.57 kg/m?  ? ?Physical Exam ?.  Pain with range of motion of the fingers.  Digits are not swollen.  Palm is nonswollen.  Medial volar aspect of the arm is not swollen.  Erythema is going up to the dorsal aspect to about the base of the tattoo in image. ? ? ? ? ?Procedures  ?Procedures ? ?ED Course / MDM  ? ?Clinical Course as of 08/22/21 1529  ?Wed Aug 22, 2021  ?1316 Pulse Rate(!): 105 [JL]  ?1418 WBC(!): 12.1 [JL]  ?  ?Clinical Course User Index ?[JL] Aug 24, 2021, MD  ? ?Medical Decision Making ?Amount and/or Complexity of Data Reviewed ?Labs: ordered. Decision-making details documented in ED Course. ?Radiology: ordered. ? ?Risk ?OTC drugs. ?Prescription drug management. ? ? ?Consult: Reviewed with Dr. Ernie Avena hand surgery.  He has examined images and we discussed case.  At this time he feels like Most consistent with cellulitis.  He advises at this time admit to hospitalist service.  He will be available up to tomorrow morning in the event reconsultation is needed.  Patient is improving can continue treating as routine cellulitis. ? ?Consult: Reviewed Dr. Leanne Chang [facility] hospitalist for admission. ? ? ? ? ?  ?Alinda Money, MD ?08/22/21 1917 ? ?

## 2021-08-22 NOTE — ED Notes (Signed)
Patient back from MRI.

## 2021-08-22 NOTE — Progress Notes (Signed)
A consult was received from an ED physician for vancomycin per pharmacy dosing.  The patient's profile has been reviewed for ht/wt/allergies/indication/available labs.   ?A one time order has been placed for vancomycin 2000mg  IV and vancomycin 500mg  IV for a total loading dose of 2500mg  IV.  ? ?Further antibiotics/pharmacy consults should be ordered by admitting physician if indicated.       ?                ?Thank you, ? ?Dimple Nanas, PharmD ?08/22/2021 1:19 PM ? ?

## 2021-08-22 NOTE — Progress Notes (Signed)
A consult was received from an ED physician for Zosyn per pharmacy dosing.  The patient's profile has been reviewed for ht/wt/allergies/indication/available labs.   ?A one time order has been placed for Zosyn 3.375g.  Further antibiotics/pharmacy consults should be ordered by admitting physician if indicated.       ?                ?Thank you, ?Loralee Pacas, PharmD, BCPS ?08/22/2021  4:41 PM ? ?

## 2021-08-22 NOTE — Sepsis Progress Note (Addendum)
Code Sepsis protocol being monitored by [facility]. Vancomycin given prior to code sepsis call. ?

## 2021-08-23 ENCOUNTER — Other Ambulatory Visit: Payer: Self-pay

## 2021-08-23 DIAGNOSIS — A419 Sepsis, unspecified organism: Secondary | ICD-10-CM

## 2021-08-23 LAB — COMPREHENSIVE METABOLIC PANEL
ALT: 13 U/L (ref 0–44)
AST: 14 U/L — ABNORMAL LOW (ref 15–41)
Albumin: 3.2 g/dL — ABNORMAL LOW (ref 3.5–5.0)
Alkaline Phosphatase: 67 U/L (ref 38–126)
Anion gap: 7 (ref 5–15)
BUN: 10 mg/dL (ref 6–20)
CO2: 25 mmol/L (ref 22–32)
Calcium: 8.2 mg/dL — ABNORMAL LOW (ref 8.9–10.3)
Chloride: 105 mmol/L (ref 98–111)
Creatinine, Ser: 0.84 mg/dL (ref 0.61–1.24)
GFR, Estimated: >60 mL/min (ref >60)
Glucose, Bld: 80 mg/dL (ref 70–99)
Potassium: 3.6 mmol/L (ref 3.5–5.1)
Sodium: 137 mmol/L (ref 135–145)
Total Bilirubin: 0.4 mg/dL (ref 0.3–1.2)
Total Protein: 6.9 g/dL (ref 6.5–8.1)

## 2021-08-23 LAB — CBC
HCT: 42.4 % (ref 39.0–52.0)
Hemoglobin: 14 g/dL (ref 13.0–17.0)
MCH: 29.2 pg (ref 26.0–34.0)
MCHC: 33 g/dL (ref 30.0–36.0)
MCV: 88.5 fL (ref 80.0–100.0)
Platelets: 307 10*3/uL (ref 150–400)
RBC: 4.79 MIL/uL (ref 4.22–5.81)
RDW: 13.4 % (ref 11.5–15.5)
WBC: 10.3 10*3/uL (ref 4.0–10.5)
nRBC: 0 % (ref 0.0–0.2)

## 2021-08-23 LAB — HIV ANTIBODY (ROUTINE TESTING W REFLEX): HIV Screen 4th Generation wRfx: NONREACTIVE

## 2021-08-23 NOTE — Hospital Course (Signed)
31 y.o. male with medical history significant of recurrent cellulitis, neuropathy, renal stones, cervical disc disease, low back pain, obstructive uropathy presenting with ongoing back pain and recurrent cellulitis. ? ?Patient presenting with recurrent cellulitis of his right hand.  Has had to follow-up in the past admitted to date of swelling and redness.  Also reports some tingling in his left hand.  Additionally he has had chronic back pain with worsening backache for the past 2 weeks.  Significantly worse for the past 2 days.  Now with some pain going down his left leg.  No urinary or fecal incontinence, no weakness.  Significant pain of the back on palpation or with movement. ?  ?Has had previous episodes of cellulitis of the hand work-up.  Most recent was 5 years ago.  Was initially seen here and then later at [facility].  States he has had multiple imaging studies as well as a work-up for rheumatoid arthritis and lupus which was all negative.  He was to have a biopsy but he lost his insurance and this was never done.  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 ? ?He denies fevers, chills, chest pain, shortness of breath, abdominal pain, constipation, diarrhea, nausea, vomiting. ?

## 2021-08-23 NOTE — Progress Notes (Signed)
Chaplain provided [employer] on [employer], Healthcare POA.  He stated he would be interested in completing it and assigned his fiance as his healthcare agent.  Chaplain let him know how to get in touch with her when he is ready to sign it.  He wants his fiance to fill out the paperwork for him.   ? ?Chaplain provided document, went over the purpose of the document, and showcased how to fill it out.  Chaplain also checked in with Koleen Nimrod who voiced that he was feeling "so-so" today.  Chaplain offered support and let him know about the presence of the Spiritual Care team.  ? ? ? 08/23/21 1300  ?Clinical Encounter Type  ?Visited With Patient  ?Visit Type Initial;Social support  ?Referral From Patient  ?Consult/Referral To Chaplain  ?Spiritual Encounters  ?Spiritual Needs Literature;Brochure  ? ? ?

## 2021-08-23 NOTE — Progress Notes (Signed)
?Progress Note ? ? ?Patient: Jacob Nixon. WUJ:[id number] DOB: Mar 16, 1991 DOA: 08/22/2021     0 ?DOS: the patient was seen and examined on 08/23/2021 ?  ?Brief hospital course: ?31 y.o. male with medical history significant of recurrent cellulitis, neuropathy, renal stones, cervical disc disease, low back pain, obstructive uropathy presenting with ongoing back pain and recurrent cellulitis. ? ?Patient presenting with recurrent cellulitis of his right hand.  Has had to follow-up in the past admitted to date of swelling and redness.  Also reports some tingling in his left hand.  Additionally he has had chronic back pain with worsening backache for the past 2 weeks.  Significantly worse for the past 2 days.  Now with some pain going down his left leg.  No urinary or fecal incontinence, no weakness.  Significant pain of the back on palpation or with movement. ?  ?Has had previous episodes of cellulitis of the hand work-up.  Most recent was 5 years ago.  Was initially seen here and then later at [facility].  States he has had multiple imaging studies as well as a work-up for rheumatoid arthritis and lupus which was all negative.  He was to have a biopsy but he lost his insurance and this was never done.  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 ? ?He denies fevers, chills, chest pain, shortness of breath, abdominal pain, constipation, diarrhea, nausea, vomiting. ? ?Assessment and Plan: ?No notes have been filed under this hospital service. ?Service: Hospitalist ? ?Cellulitis ?> Patient with history of recurrent cellulitis returning with evidence of cellulitis. ?> Right hand with erythema, edema, warmth.  Leukocytosis 12.1 at presentation.  Soft tissue swelling on right hand x-ray without bony involvement. ?> Vancomycin and Zosyn in the ED ?> Also noted to  have some inflammatory changes in the back but no abscess or discitis ?> EDP discussed with neurosurgeon on-call, they believe likely cellulitis ?> Previous work-up for rheumatoid arthritis and lupus negative.  Previous imaging negative for explanation.  ?- Continued on ceftriaxone (history of penicillin allergy but pt did tolerate rocephin in past) ?- Currently afebrile ? ?Back pain ?> Acute on chronic back pain.  Inflammation noted in the soft tissue at the L4-L5 level but no abscess or discitis. ?- Continue as needed pain control (Tylenol for mild pain, Norco for moderate pain or severe pain, Dilaudid for breakthrough pain) ?-Stable this AM ?  ? ?  ? ?Subjective: Continues with R hand swelling and pain ? ?Physical Exam: ?Vitals:  ? 08/23/21 0342 08/23/21 0734 08/23/21 0828 08/23/21 1309  ?BP: 103/71 99/62  115/78  ?Pulse: 84 70  81  ?Resp: 14 16  14   ?Temp: (!) 97.4 ?F (36.3 ?C) 97.7 ?F (36.5 ?C)  98 ?F (36.7 ?C)  ?TempSrc: Oral Axillary  Oral  ?SpO2: 99% 100% 100% 99%  ?Weight:      ?Height:      ? ?General exam: Awake, laying in bed, in nad ?Respiratory system: Normal respiratory effort, no wheezing ?Cardiovascular system: regular rate, s1, s2 ?Gastrointestinal system: Soft, nondistended, positive BS ?Central nervous system: CN2-12 grossly intact, strength intact ?Extremities: Perfused, R wrist swelling and pain on palpation ?Skin: Normal skin turgor, no notable skin lesions seen ?Psychiatry: Mood normal // no visual hallucinations  ? ?Data Reviewed: ? ?Labs reviewed. WBC 10.3 ? ?Family  Communication: Pt in room, family at bedside ? ?Disposition: ?Status is: Observation ?The patient will require care spanning > 2 midnights and should be moved to inpatient because: Severity of illness ? Planned Discharge Destination: Home ? ? ? ? ?Author: ?Rickey Barbara, MD ?08/23/2021 3:09 PM ? ?For on call review [email or web address]  ?

## 2021-08-24 ENCOUNTER — Inpatient Hospital Stay (HOSPITAL_COMMUNITY): Payer: Self-pay

## 2021-08-24 DIAGNOSIS — M5440 Lumbago with sciatica, unspecified side: Secondary | ICD-10-CM

## 2021-08-24 LAB — CBC
HCT: 43.6 % (ref 39.0–52.0)
Hemoglobin: 14.2 g/dL (ref 13.0–17.0)
MCH: 28.8 pg (ref 26.0–34.0)
MCHC: 32.6 g/dL (ref 30.0–36.0)
MCV: 88.4 fL (ref 80.0–100.0)
Platelets: 299 10*3/uL (ref 150–400)
RBC: 4.93 MIL/uL (ref 4.22–5.81)
RDW: 13.4 % (ref 11.5–15.5)
WBC: 8.3 10*3/uL (ref 4.0–10.5)
nRBC: 0 % (ref 0.0–0.2)

## 2021-08-24 LAB — COMPREHENSIVE METABOLIC PANEL
ALT: 18 U/L (ref 0–44)
AST: 15 U/L (ref 15–41)
Albumin: 3 g/dL — ABNORMAL LOW (ref 3.5–5.0)
Alkaline Phosphatase: 68 U/L (ref 38–126)
Anion gap: 7 (ref 5–15)
BUN: 9 mg/dL (ref 6–20)
CO2: 26 mmol/L (ref 22–32)
Calcium: 8.5 mg/dL — ABNORMAL LOW (ref 8.9–10.3)
Chloride: 104 mmol/L (ref 98–111)
Creatinine, Ser: 0.79 mg/dL (ref 0.61–1.24)
GFR, Estimated: >60 mL/min (ref >60)
Glucose, Bld: 114 mg/dL — ABNORMAL HIGH (ref 70–99)
Potassium: 3.9 mmol/L (ref 3.5–5.1)
Sodium: 137 mmol/L (ref 135–145)
Total Bilirubin: 0.4 mg/dL (ref 0.3–1.2)
Total Protein: 6.8 g/dL (ref 6.5–8.1)

## 2021-08-24 LAB — URINE CULTURE: Culture: NO GROWTH

## 2021-08-24 MED ORDER — IOHEXOL 300 MG/ML  SOLN
100.0000 mL | Freq: Once | INTRAMUSCULAR | Status: AC | PRN
  Administered 2021-08-24: 100 mL via INTRAVENOUS

## 2021-08-24 MED ORDER — SODIUM CHLORIDE (PF) 0.9 % IJ SOLN
INTRAMUSCULAR | Status: AC
  Filled 2021-08-24: qty 50

## 2021-08-24 NOTE — Progress Notes (Signed)
?Progress Note ? ? ?Patient: Jacob Nixon. [id number]:[id number] DOB: [birth year] DOA: 08/22/2021     1 ?DOS: the patient was seen and examined on 08/24/2021 ?  ?Brief hospital course: ?31 y.o. male with medical history significant of recurrent cellulitis, neuropathy, renal stones, cervical disc disease, low back pain, obstructive uropathy presenting with ongoing back pain and recurrent cellulitis. ? ?Patient presenting with recurrent cellulitis of his right hand.  Has had to follow-up in the past admitted to date of swelling and redness.  Also reports some tingling in his left hand.  Additionally he has had chronic back pain with worsening backache for the past 2 weeks.  Significantly worse for the past 2 days.  Now with some pain going down his left leg.  No urinary or fecal incontinence, no weakness.  Significant pain of the back on palpation or with movement. ?  ?Has had previous episodes of cellulitis of the hand work-up.  Most recent was 5 years ago.  Was initially seen here and then later at [facility].  States he has had multiple imaging studies as well as a work-up for rheumatoid arthritis and lupus which was all negative.  He was to have a biopsy but he lost his insurance and this was never done.  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 ? ?He denies fevers, chills, chest pain, shortness of breath, abdominal pain, constipation, diarrhea, nausea, vomiting. ? ?Assessment and Plan: ?No notes have been filed under this hospital service. ?Service: Hospitalist ? ?Cellulitis ?> Patient with history of recurrent cellulitis returning with evidence of cellulitis. ?> Right hand with erythema, edema, warmth.  Leukocytosis 12.1 at presentation.  Soft tissue swelling on right hand x-ray without bony involvement. ?> Vancomycin and Zosyn in the ED ?> Also noted to  have some inflammatory changes in the back but no abscess or discitis ?> Previous work-up for rheumatoid arthritis and lupus negative. ?- Continued on ceftriaxone (history of penicillin allergy but pt did tolerate rocephin in past) ?- R hand swelling persists with continued pain. Swelling seems to now spare part of forearm ?-Given continued symptoms, have ordered CT R hand ?-repeat cbc in AM ? ?Back pain ?> Acute on chronic back pain.  Inflammation noted in the soft tissue at the L4-L5 level but no abscess or discitis. ?- Continue as needed pain control (Tylenol for mild pain, Norco for moderate pain or severe pain, Dilaudid for breakthrough pain) ?-remain stable ?  ?  ? ?Subjective: Continues to report R hand pain and swelling, albeit swelling seems improved somewhat.  ? ?Physical Exam: ?Vitals:  ? 08/23/21 2028 08/24/21 0522 08/24/21 9357 08/24/21 1437  ?BP: 113/80 (!) 88/53 110/82 112/67  ?Pulse: 84 66 78 69  ?Resp: 18 17 18 18   ?Temp: 98.1 ?F (36.7 ?C)  98.6 ?F (37 ?C) 98 ?F (36.7 ?C)  ?TempSrc: Oral  Oral Oral  ?SpO2: 98% 98% 100% 99%  ?Weight:      ?Height:      ? ?General exam: Conversant, in no acute distress ?Respiratory system: normal chest rise, clear, no audible wheezing ?Cardiovascular system: regular rhythm, s1-s2 ?Gastrointestinal system: Nondistended, nontender, pos BS ?Central nervous system: No seizures, no tremors ?Extremities: No cyanosis, no joint deformities ?Skin: No rashes, no pallor ?Psychiatry: Affect normal // no auditory hallucinations  ? ?Data Reviewed: ? ?Labs  reviewed. WBC 8.3 ? ?Family Communication: Pt in room, family at bedside ? ?Disposition: ?Status is: Observation ?The patient will require care spanning > 2 midnights and should be moved to inpatient because: Severity of illness ? Planned Discharge Destination: Home ? ? ? ? ?Author: ?Rickey Barbara, MD ?08/24/2021 3:52 PM ? ?For on call review [email or web address]  ?

## 2021-08-25 LAB — COMPREHENSIVE METABOLIC PANEL
ALT: 20 U/L (ref 0–44)
AST: 17 U/L (ref 15–41)
Albumin: 3.2 g/dL — ABNORMAL LOW (ref 3.5–5.0)
Alkaline Phosphatase: 69 U/L (ref 38–126)
Anion gap: 8 (ref 5–15)
BUN: 9 mg/dL (ref 6–20)
CO2: 25 mmol/L (ref 22–32)
Calcium: 8.6 mg/dL — ABNORMAL LOW (ref 8.9–10.3)
Chloride: 102 mmol/L (ref 98–111)
Creatinine, Ser: 0.88 mg/dL (ref 0.61–1.24)
GFR, Estimated: >60 mL/min (ref >60)
Glucose, Bld: 140 mg/dL — ABNORMAL HIGH (ref 70–99)
Potassium: 3.7 mmol/L (ref 3.5–5.1)
Sodium: 135 mmol/L (ref 135–145)
Total Bilirubin: 0.4 mg/dL (ref 0.3–1.2)
Total Protein: 7.2 g/dL (ref 6.5–8.1)

## 2021-08-25 LAB — CBC
HCT: 42.5 % (ref 39.0–52.0)
Hemoglobin: 14.4 g/dL (ref 13.0–17.0)
MCH: 29.4 pg (ref 26.0–34.0)
MCHC: 33.9 g/dL (ref 30.0–36.0)
MCV: 86.7 fL (ref 80.0–100.0)
Platelets: 300 10*3/uL (ref 150–400)
RBC: 4.9 MIL/uL (ref 4.22–5.81)
RDW: 13.1 % (ref 11.5–15.5)
WBC: 8.8 10*3/uL (ref 4.0–10.5)
nRBC: 0 % (ref 0.0–0.2)

## 2021-08-25 LAB — TROPONIN I (HIGH SENSITIVITY)
Troponin I (High Sensitivity): <2 ng/L (ref <18)
Troponin I (High Sensitivity): <2 ng/L (ref <18)

## 2021-08-25 MED ORDER — CEPHALEXIN 500 MG PO CAPS
500.0000 mg | ORAL_CAPSULE | Freq: Two times a day (BID) | ORAL | 0 refills | Status: AC
Start: 2021-08-25 — End: 2021-08-30

## 2021-08-25 MED ORDER — HYDROCODONE-ACETAMINOPHEN 5-325 MG PO TABS: 1.0000 | ORAL_TABLET | Freq: Four times a day (QID) | ORAL | 0 refills | Status: DC | PRN

## 2021-08-25 MED ORDER — ONDANSETRON HCL 4 MG/2ML IJ SOLN
4.0000 mg | Freq: Four times a day (QID) | INTRAMUSCULAR | Status: DC | PRN
  Administered 2021-08-25: 4 mg via INTRAVENOUS
  Filled 2021-08-25: qty 2

## 2021-08-25 NOTE — Progress Notes (Signed)
Patient will be discharging this afternoon home with wife. Belongings were returned. Education on medications will be provided.  ?

## 2021-08-25 NOTE — Discharge Summary (Signed)
?Physician Discharge Summary ?  ?Patient: Jacob Nixon. MRN: [id number] DOB: December 08, 1990  ?Admit date:     08/22/2021  ?Discharge date: 08/25/21  ?Discharge Physician: Rickey Barbara  ? ?PCP: Patient, No Pcp Per (Inactive)  ? ?Recommendations at discharge:  ? ? Follow up with PCP in 1-2 weeks ? ?NCCSR reviewed. No recent controlled substances listed. Limited quantity of narcotic prescribed for acute pain ? ?Discharge Diagnoses: ?Principal Problem: ?  Cellulitis ?Active Problems: ?  Low back pain ? ?Resolved Problems: ?  * No resolved hospital problems. * ? ?Hospital Course: ?31 y.o. male with medical history significant of recurrent cellulitis, neuropathy, renal stones, cervical disc disease, low back pain, obstructive uropathy presenting with ongoing back pain and recurrent cellulitis. ? ?Patient presenting with recurrent cellulitis of his right hand.  Has had to follow-up in the past admitted to date of swelling and redness.  Also reports some tingling in his left hand.  Additionally he has had chronic back pain with worsening backache for the past 2 weeks.  Significantly worse for the past 2 days.  Now with some pain going down his left leg.  No urinary or fecal incontinence, no weakness.  Significant pain of the back on palpation or with movement. ?  ?Has had previous episodes of cellulitis of the hand work-up.  Most recent was 5 years ago.  Was initially seen here and then later at [facility].  States he has had multiple imaging studies as well as a work-up for rheumatoid arthritis and lupus which was all negative.  He was to have a biopsy but he lost his insurance and this was never done.  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 ? ?He denies fevers, chills, chest pain, shortness of breath, abdominal pain, constipation, diarrhea, nausea,  vomiting. ? ?Assessment and Plan: ?No notes have been filed under this hospital service. ?Service: Hospitalist ? ?Cellulitis ?> Patient with history of recurrent cellulitis returning with evidence of cellulitis. ?> Right hand with erythema, edema, warmth.  Leukocytosis 12.1 at presentation.  Soft tissue swelling on right hand x-ray without bony involvement. ?> Vancomycin and Zosyn in the ED ?> Also noted to have some inflammatory changes in the back but no abscess or discitis ?> Previous work-up for rheumatoid arthritis and lupus negative. ?- Continued on ceftriaxone (history of penicillin allergy but pt did tolerate rocephin in past) ?- Ordered and reviewed CT hand and forearm. Soft tissue swelling noted, otherwise unremarkable ?-Edema further improved. Patient to complete course of keflex on d/c ? ?Back pain ?> Acute on chronic back pain.  Inflammation noted in the soft tissue at the L4-L5 level but no abscess or discitis. ?- Continue as needed pain control (Tylenol for mild pain, Norco for moderate pain or severe pain, Dilaudid for breakthrough pain) ?-remain stable ?  ? ?  ? ? ?Consultants:  ?Procedures performed:   ?Disposition: Home ?Diet recommendation:  ?Regular diet ?DISCHARGE MEDICATION: ?Allergies as of 08/25/2021   ? ?   Reactions  ? Fentanyl Other (See Comments)  ? Patient prefers to NOT take this  ? Coconut Flavor Other (See Comments)  ? Scratchy throat  ? Penicillins Swelling, Other (See Comments)  ? Patient had atypical chest pain with some shortness of breath after a dose of Bicillin CR in January 2020. ED physician noted  no signs of allergic reaction at the time. Since that time tolerated Zosyn X 1 dose in the ED 08/22/21   ? ?  ? ?  ?Medication List  ?  ? ?STOP taking these medications   ? ?Aleve 220 MG tablet ?Generic drug: naproxen sodium ?  ? ?  ? ?TAKE these medications   ? ?acetaminophen 650 MG CR tablet ?Commonly known as: Tylenol 8 Hour ?Take 1 tablet (650 mg total) by mouth every 8 (eight)  hours. ?  ?cephALEXin 500 MG capsule ?Commonly known as: KEFLEX ?Take 1 capsule (500 mg total) by mouth 2 (two) times daily for 5 days. ?  ?HYDROcodone-acetaminophen 5-325 MG tablet ?Commonly known as: NORCO/VICODIN ?Take 1 tablet by mouth every 6 (six) hours as needed for severe pain. ?  ?ibuprofen 800 MG tablet ?Commonly known as: ADVIL ?Take 1 tablet (800 mg total) by mouth 3 (three) times daily. ?  ?ZyrTEC Allergy 10 MG tablet ?Generic drug: cetirizine ?Take 10 mg by mouth daily as needed for allergies or rhinitis. ?  ? ?  ? ? Follow-up Information   ? ? Follow up with PCP in 1-2 weeks Follow up.   ?Why: Hospital follow up ? ?  ?  ? ?  ?  ? ?  ? ?Discharge Exam: ?Ceasar MonsFiled Weights  ? 08/22/21 1227  ?Weight: 115.7 kg  ? ?General exam: Awake, laying in bed, in nad ?Respiratory system: Normal respiratory effort, no wheezing ?Cardiovascular system: regular rate, s1, s2 ?Gastrointestinal system: Soft, nondistended, positive BS ?Central nervous system: CN2-12 grossly intact, strength intact ?Extremities: Perfused, no clubbing ?Skin: Normal skin turgor, no notable skin lesions seen ?Psychiatry: Mood normal // no visual hallucinations  ? ?Condition at discharge: improving ? ?The results of significant diagnostics from this hospitalization (including imaging, microbiology, ancillary and laboratory) are listed below for reference.  ? ?Imaging Studies: ?CT Lumbar Spine Wo Contrast ? ?Result Date: 08/22/2021 ?CLINICAL DATA:  Low back pain radiating down the left leg. EXAM: CT LUMBAR SPINE WITHOUT CONTRAST TECHNIQUE: Multidetector CT imaging of the lumbar spine was performed without intravenous contrast administration. Multiplanar CT image reconstructions were also generated. RADIATION DOSE REDUCTION: This exam was performed according to the departmental dose-optimization program which includes automated exposure control, adjustment of the mA and/or kV according to patient size and/or use of iterative reconstruction technique.  COMPARISON:  CT abdomen and pelvis 07/08/2020 FINDINGS: Segmentation: 5 lumbar type vertebrae. Alignment: Normal. Vertebrae: No acute fracture or suspicious osseous lesion. Paraspinal and other soft tissues: Unremarkable. Disc levels: T12-L1 and L1-2: Negative. L2-3: A left foraminal to extraforaminal disc osteophyte complex has significantly increased in size and results in mild-to-moderate left neural foraminal stenosis and may affect the left L2 nerve lateral to the foramen. Patent spinal canal and right neural foramen. L3-4: Minimal disc bulging and endplate spurring without evidence of significant stenosis. L4-5: Mild disc bulging result in likely mild left lateral recess stenosis and mild bilateral neural foraminal stenosis without significant spinal stenosis. L5-S1: Minimal disc bulging without evidence of significant stenosis. IMPRESSION: 1. Mild-to-moderate left neural foraminal stenosis at L2-3 due to a disc osteophyte complex. 2. Mild bilateral neural foraminal and left lateral recess stenosis at L4-5. Electronically Signed   By: Sebastian AcheAllen  Grady M.D.   On: 08/22/2021 13:45  ? ?MR Lumbar Spine W Wo Contrast ? ?Result Date: 08/22/2021 ?CLINICAL DATA:  Epidural abscess suspected. Worsening low back pain radiating down the left leg. EXAM: MRI LUMBAR SPINE WITHOUT AND WITH CONTRAST TECHNIQUE: Multiplanar and multiecho pulse  sequences of the lumbar spine were obtained without and with intravenous contrast. CONTRAST:  31mL GADAVIST GADOBUTROL 1 MMOL/ML IV SOLN COMPARISON:  Lumbar spine CT 08/22/2021 FINDINGS: Segmentation:  Standard. Alignment:  Normal. Vertebrae: No fracture, suspicious marrow lesion, significant marrow edema, or evidence of discitis. No epidural fluid collection. Conus medullaris and cauda equina: Conus extends to the L1 level. Conus and cauda equina appear normal. Paraspinal and other soft tissues: Mild edema and enhancement in the soft tissues adjacent to the L4 and L5 spinous processes, left  greater than right. No paraspinal fluid collection. Disc levels: T12-L1 and L1-2: Negative. L2-3: Mild disc bulging and a left foraminal to extraforaminal disc osteophyte complex result in mild left neural foraminal

## 2021-08-27 LAB — CULTURE, BLOOD (ROUTINE X 2)
Culture: NO GROWTH
Culture: NO GROWTH

## 2021-09-03 ENCOUNTER — Emergency Department (HOSPITAL_COMMUNITY)
Admission: EM | Admit: 2021-09-03 | Discharge: 2021-09-03 | Discharge status: Against Medical Advice | Payer: Self-pay | Source: Home / Self Care

## 2021-09-04 ENCOUNTER — Emergency Department (HOSPITAL_COMMUNITY): Payer: Self-pay

## 2021-09-04 ENCOUNTER — Emergency Department (HOSPITAL_COMMUNITY)
Admission: EM | Admit: 2021-09-04 | Discharge: 2021-09-04 | Disposition: A | Discharge status: Home / Self Care | Payer: Self-pay | Attending: Emergency Medicine | Admitting: Emergency Medicine

## 2021-09-04 ENCOUNTER — Encounter (HOSPITAL_COMMUNITY): Payer: Self-pay

## 2021-09-04 ENCOUNTER — Other Ambulatory Visit: Payer: Self-pay

## 2021-09-04 DIAGNOSIS — M79601 Pain in right arm: Secondary | ICD-10-CM | POA: Insufficient documentation

## 2021-09-04 DIAGNOSIS — Z91040 Latex allergy status: Secondary | ICD-10-CM | POA: Insufficient documentation

## 2021-09-04 DIAGNOSIS — M7989 Other specified soft tissue disorders: Secondary | ICD-10-CM | POA: Insufficient documentation

## 2021-09-04 LAB — BASIC METABOLIC PANEL
Anion gap: 5 (ref 5–15)
BUN: 11 mg/dL (ref 6–20)
CO2: 23 mmol/L (ref 22–32)
Calcium: 8.6 mg/dL — ABNORMAL LOW (ref 8.9–10.3)
Chloride: 107 mmol/L (ref 98–111)
Creatinine, Ser: 0.86 mg/dL (ref 0.61–1.24)
GFR, Estimated: >60 mL/min (ref >60)
Glucose, Bld: 101 mg/dL — ABNORMAL HIGH (ref 70–99)
Potassium: 4 mmol/L (ref 3.5–5.1)
Sodium: 135 mmol/L (ref 135–145)

## 2021-09-04 LAB — CBC WITH DIFFERENTIAL/PLATELET
Abs Immature Granulocytes: 0.01 10*3/uL (ref 0.00–0.07)
Basophils Absolute: 0.1 10*3/uL (ref 0.0–0.1)
Basophils Relative: 1 %
Eosinophils Absolute: 0.2 10*3/uL (ref 0.0–0.5)
Eosinophils Relative: 3 %
HCT: 45.8 % (ref 39.0–52.0)
Hemoglobin: 15.3 g/dL (ref 13.0–17.0)
Immature Granulocytes: 0 %
Lymphocytes Relative: 35 %
Lymphs Abs: 2.8 10*3/uL (ref 0.7–4.0)
MCH: 29 pg (ref 26.0–34.0)
MCHC: 33.4 g/dL (ref 30.0–36.0)
MCV: 86.7 fL (ref 80.0–100.0)
Monocytes Absolute: 0.7 10*3/uL (ref 0.1–1.0)
Monocytes Relative: 8 %
Neutro Abs: 4.4 10*3/uL (ref 1.7–7.7)
Neutrophils Relative %: 53 %
Platelets: 304 10*3/uL (ref 150–400)
RBC: 5.28 MIL/uL (ref 4.22–5.81)
RDW: 13.4 % (ref 11.5–15.5)
WBC: 8.2 10*3/uL (ref 4.0–10.5)
nRBC: 0 % (ref 0.0–0.2)

## 2021-09-04 LAB — LACTIC ACID, PLASMA: Lactic Acid, Venous: 1.1 mmol/L (ref 0.5–1.9)

## 2021-09-04 MED ORDER — DOXYCYCLINE HYCLATE 100 MG PO CAPS: 100.0000 mg | ORAL_CAPSULE | Freq: Two times a day (BID) | ORAL | 0 refills | Status: AC

## 2021-09-04 MED ORDER — HYDROCODONE-ACETAMINOPHEN 5-325 MG PO TABS
1.0000 | ORAL_TABLET | Freq: Once | ORAL | Status: AC
  Administered 2021-09-04: 1 via ORAL
  Filled 2021-09-04: qty 1

## 2021-09-04 MED ORDER — HYDROMORPHONE HCL 1 MG/ML IJ SOLN
1.0000 mg | Freq: Once | INTRAMUSCULAR | Status: AC
Start: 2021-09-04 — End: 2021-09-04
  Administered 2021-09-04: 1 mg via INTRAVENOUS
  Filled 2021-09-04: qty 1

## 2021-09-04 MED ORDER — MORPHINE SULFATE (PF) 2 MG/ML IV SOLN
2.0000 mg | Freq: Once | INTRAVENOUS | Status: AC
  Administered 2021-09-04: 2 mg via INTRAVENOUS
  Filled 2021-09-04: qty 1

## 2021-09-04 MED ORDER — HYDROCODONE-ACETAMINOPHEN 5-325 MG PO TABS: 2.0000 | ORAL_TABLET | ORAL | 0 refills | Status: AC | PRN

## 2021-09-04 MED ORDER — KETOROLAC TROMETHAMINE 30 MG/ML IJ SOLN
30.0000 mg | Freq: Once | INTRAMUSCULAR | Status: AC
  Administered 2021-09-04: 30 mg via INTRAVENOUS
  Filled 2021-09-04: qty 1

## 2021-09-04 NOTE — ED Provider Notes (Signed)
?[facility]-EMERGENCY DEPT ?Provider Note ? ? ?CSN: [id number] ?Arrival date & time: 09/04/21  0806 ? ?  ? ?History ? ?Chief Complaint  ?Patient presents with  ? Arm Pain  ? Arm Swelling  ? ? ?Jacob Morasdrian L Kang Jr. is a 31 y.o. male. ? ?HPI ?31 y/o M with PMH of suspected recurrent cellulitis of the right arm and carpal tunnel syndrome of the right hand s/p release in 2017 presents with migrating right arm swelling x3 days. Patient states the swelling started on the dorsum of the right hand and has been rapidly spreading up to the mid bicep. Patient elicits associated paresthesia to the dorsum of the hand, pain with extension of the 2nd through 5th digits and wrist, chills, and whole body cramps. Denies fever, sweats, unintentional weight loss, headaches, vision changes, confusion, trouble speaking, ataxia, numbness/tingling/weakness of the legs and left arm, chest pain, SHOB, cough, abdominal pain, changes in bowel habits, and changes in urinary habits. Denies recent injuries to the hand. Pt was recently hospitalized for a similar presentation to today and was discharged on 4/8. He also had a similar presentation in 2018 and [facility] and had a negative workup.  He received IV antibiotics during this last hospitilization and was discharged on a course of Keflex which he completed last Thursday. Infectious disease was not convinced it was infectious in nature during that hospitalization. He received a workup from rheumatology which did not reveal a cause. Denies history of IVDU. Denies known history or family history of blood dyscrasias.  ? 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 He then stopped that job for several years. He restarted working detailing cars recently and presented again with a similar symptoms of 4/5. He went back to work detailing cars again after he was discharged. He states " I may  need to find a new job"  ?  ? ?Home Medications ?Prior to Admission medications   ?Medication Sig Start Date End Date Taking? Authorizing Provider  ?doxycycline (VIBRAMYCIN) 100 MG capsule Take 1 capsule (100 mg total) by mouth 2 (two) times daily for 10 days. 09/04/21 09/14/21 Yes Mare FerrariBelaya, Edgard Debord A, PA-C  ?HYDROcodone-acetaminophen (NORCO/VICODIN) 5-325 MG tablet Take 2 tablets by mouth every 4 (four) hours as needed for up to 5 days. 09/04/21 09/09/21 Yes Mare FerrariBelaya, Dustin Bumbaugh A, PA-C  ?ZYRTEC ALLERGY 10 MG tablet Take 10 mg by mouth daily as needed for allergies or rhinitis.   Yes [provider]  ?ibuprofen (ADVIL) 800 MG tablet Take 1 tablet (800 mg total) by mouth 3 (three) times daily. ?Patient not taking: Reported on 08/22/2021 05/15/21   Mickie Bailate, Kelly H, NP  ?   ? ?Allergies    ?Fentanyl, Coconut flavor, Penicillins, and Latex   ? ?Review of Systems   ?Review of Systems ? ?Physical Exam ?Updated Vital Signs ?BP 128/80   Pulse 76   Temp 98 ?F (36.7 ?C) (Oral)   Resp (!) 21   Ht 5\' 11"  (1.803 m)   Wt 113.4 kg   SpO2 100%   BMI 34.87 kg/m?  ?Physical Exam ?Vitals and nursing note reviewed.  ?Constitutional:   ?   General: He is not in acute distress. ?   Appearance: He is well-developed.  ?HENT:  ?   Head: Normocephalic and atraumatic.  ?Eyes:  ?   Conjunctiva/sclera: Conjunctivae normal.  ?Cardiovascular:  ?   Rate and  Rhythm: Normal rate and regular rhythm.  ?   Heart sounds: No murmur heard. ?Pulmonary:  ?   Effort: Pulmonary effort is normal. No respiratory distress.  ?   Breath sounds: Normal breath sounds.  ?Abdominal:  ?   Palpations: Abdomen is soft.  ?   Tenderness: There is no abdominal tenderness.  ?Musculoskeletal:     ?   General: No swelling.  ?   Cervical back: Neck supple.  ?   Comments: 2+ swelling in right hand, wrist extending up to elbow. Compartments are soft, no overlying erythema, warmth. Very tender to palpation. 2+  radial pulses. Unable to extend digits 2-5 secondary to pain, full  flexion of all 5 digits. Cap refill <2. Extremity warm, well perfused.   ?Skin: ?   General: Skin is warm and dry.  ?   Capillary Refill: Capillary refill takes less than 2 seconds.  ?Neurological:  ?   Mental Status: He is alert.  ?Psychiatric:     ?   Mood and Affect: Mood normal.  ? ? ? ? ? ?ED Results / Procedures / Treatments   ?Labs ?(all labs ordered are listed, but only abnormal results are displayed) ?Labs Reviewed  ?BASIC METABOLIC PANEL - Abnormal; Notable for the following components:  ?    Result Value  ? Glucose, Bld 101 (*)   ? Calcium 8.6 (*)   ? All other components within normal limits  ?CULTURE, BLOOD (ROUTINE X 2)  ?CULTURE, BLOOD (ROUTINE X 2)  ?CBC WITH DIFFERENTIAL/PLATELET  ?LACTIC ACID, PLASMA  ? ? ?EKG ?None ? ?Radiology ?DG Forearm Right ? ?Result Date: 09/04/2021 ?CLINICAL DATA:  Right arm pain and swelling for several weeks. EXAM: RIGHT FOREARM - 2 VIEW COMPARISON:  None. FINDINGS: There is no evidence of fracture or other focal bone lesions. Soft tissues are unremarkable. IMPRESSION: Negative. Electronically Signed   By: Lupita Raider M.D.   On: 09/04/2021 10:19  ? ?DG Wrist Complete Right ? ?Result Date: 09/04/2021 ?CLINICAL DATA:  Right arm pain and swelling for several weeks. EXAM: RIGHT WRIST - COMPLETE 3+ VIEW COMPARISON:  None. FINDINGS: There is no evidence of fracture or dislocation. There is no evidence of arthropathy or other focal bone abnormality. Soft tissues are unremarkable. IMPRESSION: Negative. Electronically Signed   By: Lupita Raider M.D.   On: 09/04/2021 10:18  ? ?DG Hand Complete Right ? ?Result Date: 09/04/2021 ?CLINICAL DATA:  Right arm pain and swelling for several weeks. EXAM: RIGHT HAND - COMPLETE 3+ VIEW COMPARISON:  None. FINDINGS: There is no evidence of fracture or dislocation. There is no evidence of arthropathy or other focal bone abnormality. Soft tissues are unremarkable. IMPRESSION: Negative. Electronically Signed   By: Lupita Raider M.D.   On:  09/04/2021 10:17   ? ?Procedures ?Procedures  ? ? ?Medications Ordered in ED ?Medications  ?HYDROcodone-acetaminophen (NORCO/VICODIN) 5-325 MG per tablet 1 tablet (1 tablet Oral Given 09/04/21 1026)  ?morphine (PF) 2 MG/ML injection 2 mg (2 mg Intravenous Given 09/04/21 1149)  ?ketorolac (TORADOL) 30 MG/ML injection 30 mg (30 mg Intravenous Given 09/04/21 1149)  ?HYDROmorphone (DILAUDID) injection 1 mg (1 mg Intravenous Given 09/04/21 1300)  ? ? ?ED Course/ Medical Decision Making/ A&P ?  ?                        ?Medical Decision Making ?Amount and/or Complexity of Data Reviewed ?Labs: ordered. ?Radiology: ordered. ? ?Risk ?Prescription drug management. ? ?  31 year old male presenting with right arm swelling.  On arrival, he is afebrile, not tachycardic, tachypneic or hypoxic.  His right arm does appear to be swollen, nonpitting, no warmth, no significant erythema.  He does have pain with range of motion of his right arm and light touch.  His compartments are soft.  Differential diagnosis includes cellulitis, compartment syndrome, swelling from possible carpal tunnel syndrome, right upper extremity DVT. ? ?I personally reviewed his prior records from his last hospitalization and notes from his orthopedic evaluation for carpal tunnel.  He had been admitted to the hospital on 4/5 and discharged on 4/9 and treated for cellulitis of the right arm.  He stated that he completed Keflex on Thursday and his symptoms started again on Sunday.  He has had some chills but no known fevers. ? ?I personally reviewed and interpreted his lab work.  CBC without leukocytosis, BMP largely unremarkable.  Lactic acid normal.  Blood culture sent.   ? ?I ordered, reviewed and interpreted his imaging, agree with radiology read.  Right hand, forearm, wrist x-rays overall normal. ? ?Acute interventions included pain management with Toradol, morphine, and Dilaudid.  On reevaluation, patient notes some improvement in pain. ? ?I have lower suspicion  for acute DVT given his symptoms had improved with antibiotics prior.  I have low suspicion for compartment syndrome given arm is soft and pulses are intact.  Although his current presentation does not look acutely ce

## 2021-09-04 NOTE — Discharge Instructions (Addendum)
You were evaluated in the Emergency Department and after careful evaluation, we did not find any emergent condition requiring admission or further testing in the hospital. ? ?Please take the doxycycline as directed until finished.  Please use the Norco for pain.  You may also take ibuprofen and use Norco for breakthrough pain.  Do not take any additional Tylenol with the Norco ? ?Please make sure to follow-up with your primary care doctor within a week.  If you do not have one, please call the phone number on your discharge paperwork in order to establish with one.  I would also recommend that you follow-up with your orthopedic doctor whose contact information I have provided in your discharge paperwork for evaluation of possible carpal tunnel syndrome. ? ?Please return to the Emergency Department if you experience any worsening of your condition.   Thank you for allowing [place] to be a part of your care.  ?

## 2021-09-04 NOTE — ED Triage Notes (Signed)
Patient c/o right arm swelling and pain since 08/22/21. Patient states he was admitted for the same on 08/22/21. ?

## 2021-09-08 IMAGING — CR DG ABDOMEN 1V
1 series · 2 of 2 positions shown · non-contrast
Comparison: 06/15/2020.

CLINICAL DATA: Preoperative study.  Left UPJ stone.

EXAM:
ABDOMEN - 1 VIEW

[Series 1: dg abd 1 view · 0.14mm/px · 2 of 2 slices shown]
[im 1/2]
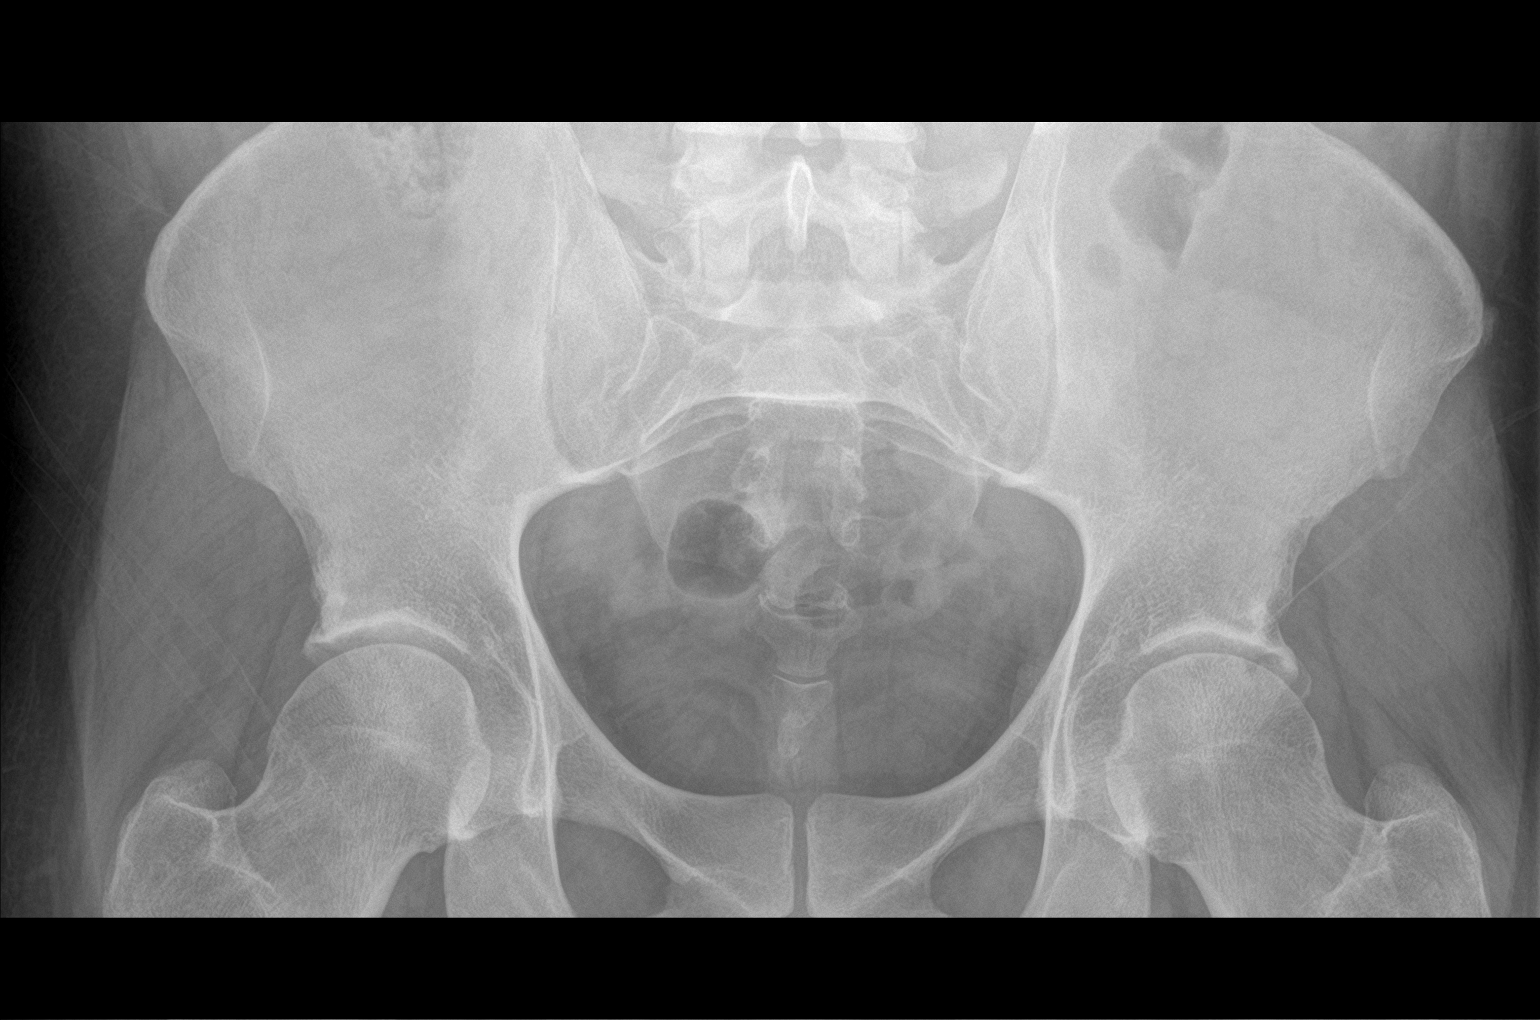
[im 2/2]
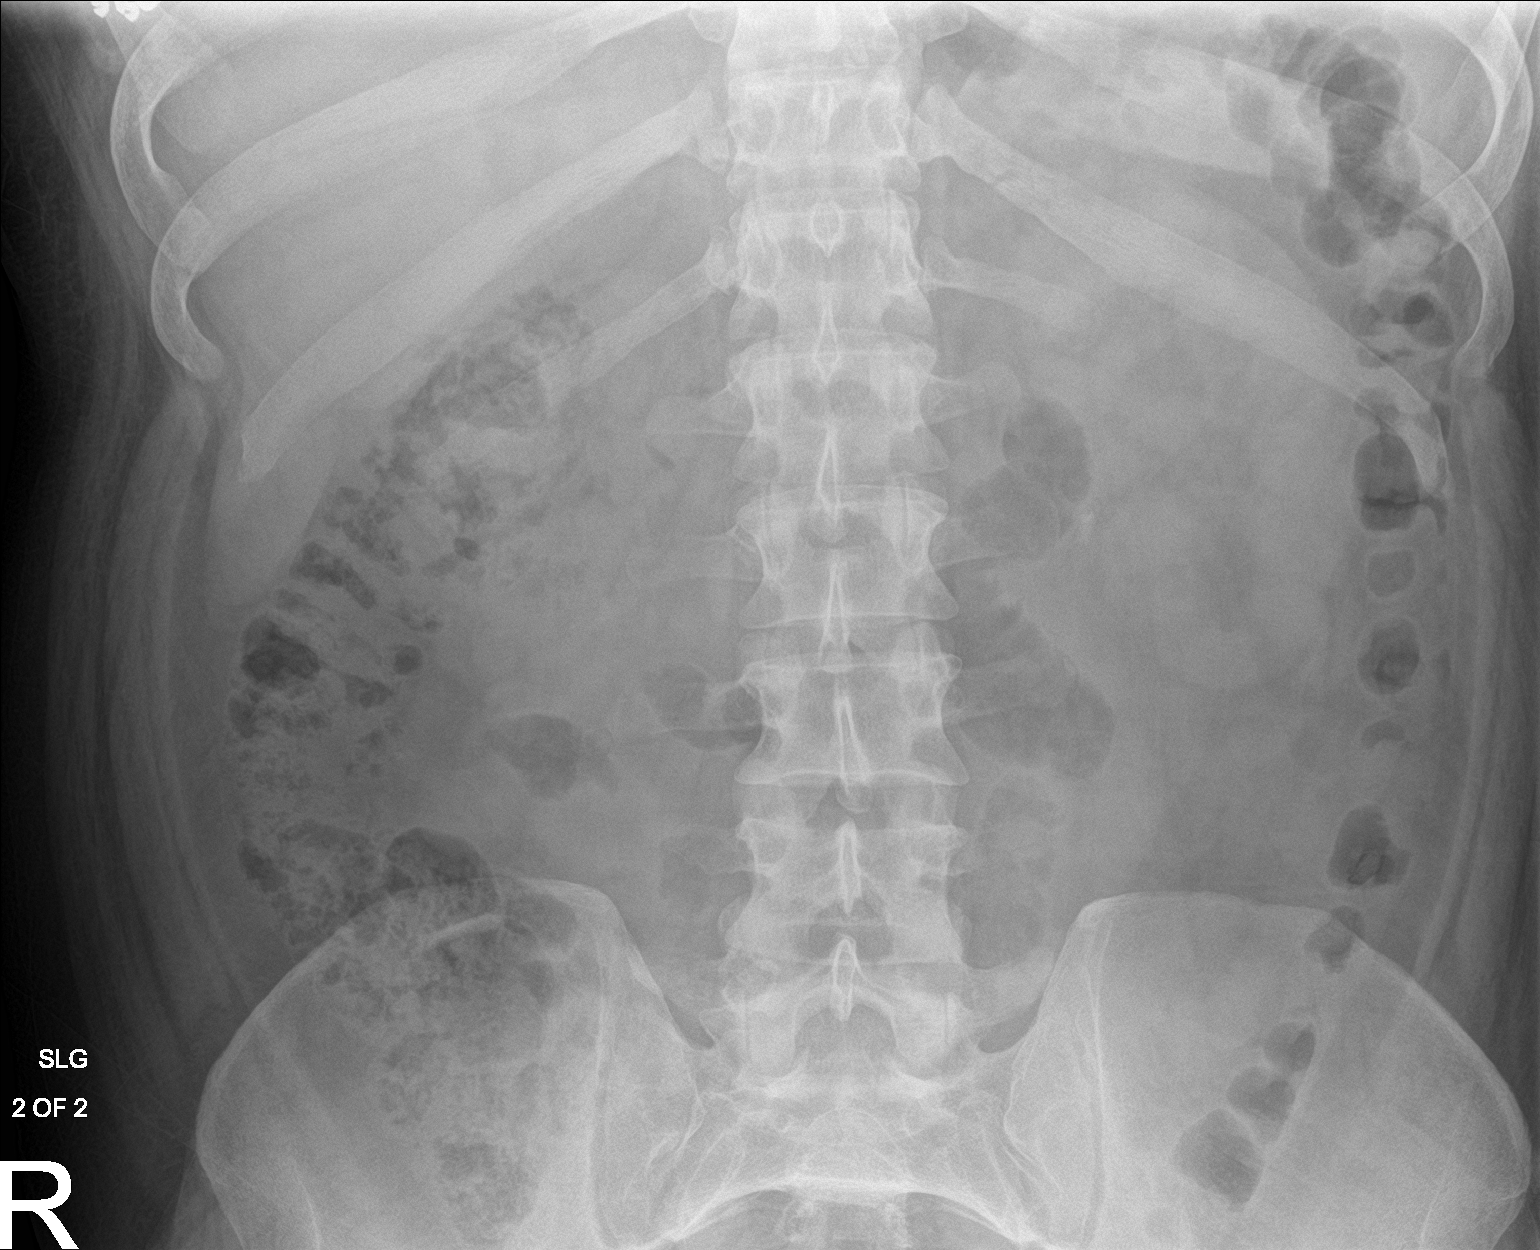

[2 of 2 positions shown; findings below may reference images not displayed]

FINDINGS: Stable 6 mm calcific density noted over the left UPJ region. This is
consistent with known left UPJ stone. No significant interim change
in position. Nonspecific air-filled loops of small bowel noted.
Colon is nondistended. No free air identified. No acute bony
abnormality.
IMPRESSION: Stable 6 mm calcific density noted over the left UPJ region. This is
consistent with known left UPJ stone. No significant interim change
in position.

## 2021-09-09 LAB — CULTURE, BLOOD (ROUTINE X 2)
Culture: NO GROWTH
Culture: NO GROWTH
Special Requests: ADEQUATE
Special Requests: ADEQUATE

## 2022-03-25 ENCOUNTER — Emergency Department (HOSPITAL_COMMUNITY): Payer: BC Managed Care – PPO

## 2022-03-25 ENCOUNTER — Encounter (HOSPITAL_COMMUNITY): Payer: Self-pay

## 2022-03-25 ENCOUNTER — Other Ambulatory Visit: Payer: Self-pay

## 2022-03-25 ENCOUNTER — Emergency Department (HOSPITAL_COMMUNITY)
Admission: EM | Admit: 2022-03-25 | Discharge: 2022-03-25 | Disposition: A | Discharge status: Home / Self Care | Payer: BC Managed Care – PPO | Attending: Emergency Medicine | Admitting: Emergency Medicine

## 2022-03-25 DIAGNOSIS — Z91040 Latex allergy status: Secondary | ICD-10-CM | POA: Insufficient documentation

## 2022-03-25 DIAGNOSIS — R319 Hematuria, unspecified: Secondary | ICD-10-CM | POA: Diagnosis not present

## 2022-03-25 DIAGNOSIS — R109 Unspecified abdominal pain: Secondary | ICD-10-CM | POA: Insufficient documentation

## 2022-03-25 LAB — CBC WITH DIFFERENTIAL/PLATELET
Abs Immature Granulocytes: 0.02 10*3/uL (ref 0.00–0.07)
Basophils Absolute: 0.1 10*3/uL (ref 0.0–0.1)
Basophils Relative: 1 %
Eosinophils Absolute: 0.2 10*3/uL (ref 0.0–0.5)
Eosinophils Relative: 2 %
HCT: 47.5 % (ref 39.0–52.0)
Hemoglobin: 15.5 g/dL (ref 13.0–17.0)
Immature Granulocytes: 0 %
Lymphocytes Relative: 36 %
Lymphs Abs: 3.4 10*3/uL (ref 0.7–4.0)
MCH: 28.8 pg (ref 26.0–34.0)
MCHC: 32.6 g/dL (ref 30.0–36.0)
MCV: 88.3 fL (ref 80.0–100.0)
Monocytes Absolute: 0.8 10*3/uL (ref 0.1–1.0)
Monocytes Relative: 8 %
Neutro Abs: 5 10*3/uL (ref 1.7–7.7)
Neutrophils Relative %: 53 %
Platelets: 319 10*3/uL (ref 150–400)
RBC: 5.38 MIL/uL (ref 4.22–5.81)
RDW: 13.5 % (ref 11.5–15.5)
WBC: 9.5 10*3/uL (ref 4.0–10.5)
nRBC: 0 % (ref 0.0–0.2)

## 2022-03-25 LAB — URINALYSIS, ROUTINE W REFLEX MICROSCOPIC
Bilirubin Urine: NEGATIVE
Glucose, UA: NEGATIVE mg/dL
Ketones, ur: NEGATIVE mg/dL
Leukocytes,Ua: NEGATIVE
Nitrite: NEGATIVE
Protein, ur: NEGATIVE mg/dL
Specific Gravity, Urine: 1.02 (ref 1.005–1.030)
pH: 5 (ref 5.0–8.0)

## 2022-03-25 LAB — BASIC METABOLIC PANEL
Anion gap: 8 (ref 5–15)
BUN: 9 mg/dL (ref 6–20)
CO2: 25 mmol/L (ref 22–32)
Calcium: 8.6 mg/dL — ABNORMAL LOW (ref 8.9–10.3)
Chloride: 106 mmol/L (ref 98–111)
Creatinine, Ser: 0.96 mg/dL (ref 0.61–1.24)
GFR, Estimated: >60 mL/min (ref >60)
Glucose, Bld: 81 mg/dL (ref 70–99)
Potassium: 4.1 mmol/L (ref 3.5–5.1)
Sodium: 139 mmol/L (ref 135–145)

## 2022-03-25 MED ORDER — KETOROLAC TROMETHAMINE 30 MG/ML IJ SOLN
15.0000 mg | Freq: Once | INTRAMUSCULAR | Status: AC
  Administered 2022-03-25: 15 mg via INTRAMUSCULAR
  Filled 2022-03-25: qty 1

## 2022-03-25 MED ORDER — LIDOCAINE 5 % EX PTCH
1.0000 | MEDICATED_PATCH | CUTANEOUS | Status: DC
  Administered 2022-03-25: 1 via TRANSDERMAL
  Filled 2022-03-25: qty 1

## 2022-03-25 MED ORDER — ONDANSETRON HCL 4 MG/2ML IJ SOLN
4.0000 mg | Freq: Once | INTRAMUSCULAR | Status: AC
  Administered 2022-03-25: 4 mg via INTRAVENOUS
  Filled 2022-03-25: qty 2

## 2022-03-25 MED ORDER — HYDROMORPHONE HCL 1 MG/ML IJ SOLN
1.0000 mg | Freq: Once | INTRAMUSCULAR | Status: AC
  Administered 2022-03-25: 1 mg via INTRAVENOUS
  Filled 2022-03-25: qty 1

## 2022-03-25 MED ORDER — OXYCODONE-ACETAMINOPHEN 5-325 MG PO TABS: 1.0000 | ORAL_TABLET | Freq: Four times a day (QID) | ORAL | 0 refills | Status: DC | PRN

## 2022-03-25 MED ORDER — TAMSULOSIN HCL 0.4 MG PO CAPS: 0.4000 mg | ORAL_CAPSULE | Freq: Every day | ORAL | 0 refills | Status: DC

## 2022-03-25 MED ORDER — ONDANSETRON 8 MG PO TBDP: 8.0000 mg | ORAL_TABLET | Freq: Three times a day (TID) | ORAL | 0 refills | Status: DC | PRN

## 2022-03-25 MED ORDER — KETOROLAC TROMETHAMINE 10 MG PO TABS
10.0000 mg | ORAL_TABLET | Freq: Four times a day (QID) | ORAL | 0 refills | Status: DC | PRN
Start: 2022-03-25 — End: 2022-07-08

## 2022-03-25 NOTE — ED Notes (Signed)
An After Visit Summary was printed and given to the patient. Discharge instructions given and no further questions at this time.  Pt leaving with fiance, who states she will be driving.

## 2022-03-25 NOTE — ED Provider Notes (Signed)
[facility] [place] [facility]-EMERGENCY DEPT Provider Note   CSN: [id number] Arrival date & time: 03/25/22  1311     History  Chief Complaint  Patient presents with   Flank Pain    Jacob Nixon. is a 31 y.o. male.   Flank Pain     Patient with previous history of nephrolithiasis, obstructive uropathy, pyelo-, previous cystoscopy and lithotripsy presents today due to left flank pain.  Started acutely last night, its constant.  It is worse with movement.  Associated with hematuria, denies any fevers, nausea, vomiting, chest pain, shortness of breath, hemoptysis, hematemesis, rectal bleeding, back pain.  Home Medications Prior to Admission medications   Medication Sig Start Date End Date Taking? Authorizing Provider  ketorolac (TORADOL) 10 MG tablet Take 1 tablet (10 mg total) by mouth every 6 (six) hours as needed. 03/25/22  Yes Theron Arista, PA-C  ondansetron (ZOFRAN-ODT) 8 MG disintegrating tablet Take 1 tablet (8 mg total) by mouth every 8 (eight) hours as needed for nausea or vomiting. 03/25/22  Yes Theron Arista, PA-C  oxyCODONE-acetaminophen (PERCOCET/ROXICET) 5-325 MG tablet Take 1 tablet by mouth every 6 (six) hours as needed for severe pain. 03/25/22  Yes Theron Arista, PA-C  tamsulosin (FLOMAX) 0.4 MG CAPS capsule Take 1 capsule (0.4 mg total) by mouth daily after supper. 03/25/22  Yes Theron Arista, PA-C  ibuprofen (ADVIL) 800 MG tablet Take 1 tablet (800 mg total) by mouth 3 (three) times daily. Patient not taking: Reported on 08/22/2021 05/15/21   Mickie Bail, NP  ZYRTEC ALLERGY 10 MG tablet Take 10 mg by mouth daily as needed for allergies or rhinitis.    [provider]      Allergies    Fentanyl, Coconut flavor, Penicillins, and Latex    Review of Systems   Review of Systems  Genitourinary:  Positive for flank pain.    Physical Exam Updated Vital Signs BP (!) 145/96   Pulse 67   Temp 98.2 F (36.8 C) (Oral)   Resp (!) 23   Ht 5\' 11"  (1.803 m)    Wt 120.2 kg   SpO2 97%   BMI 36.96 kg/m  Physical Exam Vitals and nursing note reviewed. Exam conducted with a chaperone present.  Constitutional:      Appearance: Normal appearance.  HENT:     Head: Normocephalic and atraumatic.  Eyes:     General: No scleral icterus.       Right eye: No discharge.        Left eye: No discharge.     Extraocular Movements: Extraocular movements intact.     Pupils: Pupils are equal, round, and reactive to light.  Cardiovascular:     Rate and Rhythm: Normal rate and regular rhythm.     Pulses: Normal pulses.     Heart sounds: Normal heart sounds. No murmur heard.    No friction rub. No gallop.     Comments: Regular rate and rhythm, upper and lower extremity pulses are symmetric bilaterally Pulmonary:     Effort: Pulmonary effort is normal. No respiratory distress.     Breath sounds: Normal breath sounds.  Abdominal:     General: Abdomen is flat. Bowel sounds are normal. There is no distension.     Palpations: Abdomen is soft.     Tenderness: There is no abdominal tenderness. There is left CVA tenderness.  Skin:    General: Skin is warm and dry.     Coloration: Skin is not jaundiced.  Neurological:  Mental Status: He is alert. Mental status is at baseline.     Coordination: Coordination normal.     ED Results / Procedures / Treatments   Labs (all labs ordered are listed, but only abnormal results are displayed) Labs Reviewed  BASIC METABOLIC PANEL - Abnormal; Notable for the following components:      Result Value   Calcium 8.6 (*)    All other components within normal limits  URINALYSIS, ROUTINE W REFLEX MICROSCOPIC - Abnormal; Notable for the following components:   APPearance HAZY (*)    Hgb urine dipstick MODERATE (*)    Bacteria, UA RARE (*)    All other components within normal limits  URINE CULTURE  CBC WITH DIFFERENTIAL/PLATELET    EKG None  Radiology DG Chest 2 View  Result Date: 03/25/2022 CLINICAL DATA:  Cough  EXAM: CHEST - 2 VIEW COMPARISON:  Chest x-ray October 24, 2020 FINDINGS: The cardiomediastinal silhouette is unchanged in contour. Low lung volumes with bronchovascular crowding. No focal pulmonary opacity. No pleural effusion or pneumothorax. The visualized upper abdomen is unremarkable. No acute osseous abnormality. IMPRESSION: No acute cardiopulmonary abnormality. Electronically Signed   By: Jacob Moores M.D.   On: 03/25/2022 19:14   CT Renal Stone Study  Result Date: 03/25/2022 CLINICAL DATA:  Left flank pain EXAM: CT ABDOMEN AND PELVIS WITHOUT CONTRAST TECHNIQUE: Multidetector CT imaging of the abdomen and pelvis was performed following the standard protocol without IV contrast. RADIATION DOSE REDUCTION: This exam was performed according to the departmental dose-optimization program which includes automated exposure control, adjustment of the mA and/or kV according to patient size and/or use of iterative reconstruction technique. COMPARISON:  07/08/2020 FINDINGS: Lower chest: Unremarkable Hepatobiliary: Unremarkable Pancreas: Unremarkable Spleen: Unremarkable Adrenals/Urinary Tract: Both adrenal glands appear normal. Nonobstructive 2 mm punctate right kidney upper pole calculus. Nonobstructive punctate 2 mm left kidney lower pole renal calculus. No ureteral or bladder calculus. Stomach/Bowel: Prominent stool throughout the colon favors constipation. Normal appendix. Vascular/Lymphatic: Unremarkable Reproductive: Unremarkable Other: No supplemental non-categorized findings. Musculoskeletal: Mild but advanced for age degenerative spurring in both acetabula and femoral heads. Chronically fragmented spur of the left anterior acetabulum. IMPRESSION: 1. Prominent stool throughout the colon favors constipation. 2. Single bilateral nonobstructive renal calculi. 3. Mild but advanced for age degenerative spurring in both acetabula and femoral heads. Chronically fragmented spur of the left anterior acetabulum.  Electronically Signed   By: Gaylyn Rong M.D.   On: 03/25/2022 15:18    Procedures Procedures    Medications Ordered in ED Medications  lidocaine (LIDODERM) 5 % 1 patch (1 patch Transdermal Patch Applied 03/25/22 1842)  ketorolac (TORADOL) 30 MG/ML injection 15 mg (15 mg Intramuscular Given 03/25/22 1410)  HYDROmorphone (DILAUDID) injection 1 mg (1 mg Intravenous Given 03/25/22 1842)  ondansetron (ZOFRAN) injection 4 mg (4 mg Intravenous Given 03/25/22 1842)  HYDROmorphone (DILAUDID) injection 1 mg (1 mg Intravenous Given 03/25/22 2036)    ED Course/ Medical Decision Making/ A&P                           Medical Decision Making Amount and/or Complexity of Data Reviewed Labs: ordered. Radiology: ordered.  Risk Prescription drug management.   Patient presents due to left flank pain.  Differential includes but not limited to nephrolithiasis, UTI, pyelonephritis, obstructive uropathy, MSK, atypical pneumonia, dissection AAA also considered.  I reviewed external medical records including previous neurology visits.  Also spoke with patient's fianc who is at bedside and permission  was given.  On exam he has left CVA tenderness.  Abdomen is soft nontender, upper and lower extremity pulses are symmetric bilaterally.  Lungs are clear to auscultation. -BP (!) 145/96   Pulse 67   Temp 98.2 F (36.8 C) (Oral)   Resp (!) 23   Ht 5\' 11"  (1.803 m)   Wt 120.2 kg   SpO2 97%   BMI 36.96 kg/m   I ordered, viewed and interpreted laboratory work-up. CBC without leukocytosis or anemia. BMP without gross electrolyte derangement or AKI. UA is notable for moderate hematuria.  No signs of infection such as leukocyturia, nitrites or pyuria. Urine culture is pending.  CT renal study is negative for kidney stone.  He does have 2 renal stones but none of them are obstructing which would be causing his pain.  He has incidental degenerative changes which I discussed with him.    This x-ray is  negative for pneumonia or acute process.  I reevaluated the patient after pain medicine.  I reviewed his home medicine.  Also given 2 mg of Dilaudid and Toradol.  Symptoms seem most consistent with a kidney stone especially with the hematuria.  He may have a uropath or urethritis, culture pending. No fever or infective symptoms so will hold off an antibiotics.  Possibly musculoskeletal in nature.  He has no symptoms or exam findings that would be suggestive of dissection, AAA.  Even briefly considered PE but he is not hypoxic, tachycardic or having any chest pain or shortness of breath so I think that would be far stretched.  I referred patient to urology, will treat pain, flomax for suspected uropathy.  Very strict return precautions were discussed with the patient.        Final Clinical Impression(s) / ED Diagnoses Final diagnoses:  Flank pain    Rx / DC Orders ED Discharge Orders          Ordered    ondansetron (ZOFRAN-ODT) 8 MG disintegrating tablet  Every 8 hours PRN        03/25/22 2023    oxyCODONE-acetaminophen (PERCOCET/ROXICET) 5-325 MG tablet  Every 6 hours PRN        03/25/22 2024    ketorolac (TORADOL) 10 MG tablet  Every 6 hours PRN        03/25/22 2025    tamsulosin (FLOMAX) 0.4 MG CAPS capsule  Daily after supper        03/25/22 2025              Sherrill Raring, PA-C 03/25/22 2121    Milton Ferguson, MD 03/27/22 [phone]

## 2022-03-25 NOTE — Discharge Instructions (Signed)
You are seen today in the emergency department due to left-sided pain.  Your care today was very reassuring, you do have 2 stones one in each of your kidney under 2 mm with are not obstructing so should not be the source of your pain.  That said the blood in the urine and the location seems like a kidney stones as we discussed we going to treat as such.  Take the Flomax in the evenings.  Take the Percocet as needed for severe pain, take Toradol every 6 hours as needed.  You can take both of these medicines together.  Take Zofran as needed for nausea and vomiting.  Do not take Tylenol for breakthrough pain.  Return to the ED for shortness breath, chest pain, coughing up blood, severe abdominal pain, inability to urinate, burning when you pee, fevers.  If your urine is positive for bacteria we will send an antibiotic.  Please call and schedule an appointment with [facility],.

## 2022-03-25 NOTE — ED Triage Notes (Signed)
Pt reports left flank pain with nausea x1 day with urinary frequency, urgency, dribbling.  Hx of kidney stones Ibuprofen with no relief.

## 2022-03-25 NOTE — ED Provider Triage Note (Signed)
Emergency Medicine Provider Triage Evaluation Note  Margie Ege , a 31 y.o. male  was evaluated in triage.  Pt complains of left flank pain started yet today.  Urine appears darker than normal.  Denies dysuria, history of previous kidney stones and this feels the same.  Denies fevers, nausea, vomiting..  Review of Systems  Per HPI  Physical Exam  BP (!) 146/93 (BP Location: Left Arm)   Pulse 86   Temp 99.2 F (37.3 C) (Oral)   Resp 16   Ht 5\' 11"  (1.803 m)   Wt 120.2 kg   SpO2 98%   BMI 36.96 kg/m  Gen:   Awake, no distress   Resp:  Normal effort  MSK:   Moves extremities without difficulty  Other:    Medical Decision Making  Medically screening exam initiated at 1:56 PM.  Appropriate orders placed.  Margie Ege. was informed that the remainder of the evaluation will be completed by another provider, this initial triage assessment does not replace that evaluation, and the importance of remaining in the ED until their evaluation is complete.     Sherrill Raring, PA-C 03/25/22 1356

## 2022-03-26 LAB — URINE CULTURE: Culture: <10000 — AB

## 2022-07-08 ENCOUNTER — Emergency Department (HOSPITAL_COMMUNITY)
Admission: EM | Admit: 2022-07-08 | Discharge: 2022-07-08 | Disposition: A | Discharge status: Home / Self Care | Payer: BC Managed Care – PPO | Attending: Emergency Medicine | Admitting: Emergency Medicine

## 2022-07-08 ENCOUNTER — Emergency Department (HOSPITAL_BASED_OUTPATIENT_CLINIC_OR_DEPARTMENT_OTHER): Payer: BC Managed Care – PPO

## 2022-07-08 ENCOUNTER — Encounter (HOSPITAL_COMMUNITY): Payer: Self-pay

## 2022-07-08 ENCOUNTER — Emergency Department (HOSPITAL_COMMUNITY): Payer: BC Managed Care – PPO

## 2022-07-08 ENCOUNTER — Other Ambulatory Visit: Payer: Self-pay

## 2022-07-08 DIAGNOSIS — M25521 Pain in right elbow: Secondary | ICD-10-CM

## 2022-07-08 DIAGNOSIS — Z91040 Latex allergy status: Secondary | ICD-10-CM | POA: Insufficient documentation

## 2022-07-08 DIAGNOSIS — L538 Other specified erythematous conditions: Secondary | ICD-10-CM

## 2022-07-08 DIAGNOSIS — M79601 Pain in right arm: Secondary | ICD-10-CM | POA: Diagnosis not present

## 2022-07-08 DIAGNOSIS — R2231 Localized swelling, mass and lump, right upper limb: Secondary | ICD-10-CM | POA: Diagnosis not present

## 2022-07-08 DIAGNOSIS — R52 Pain, unspecified: Secondary | ICD-10-CM | POA: Diagnosis not present

## 2022-07-08 DIAGNOSIS — M7989 Other specified soft tissue disorders: Secondary | ICD-10-CM | POA: Diagnosis not present

## 2022-07-08 LAB — CBC
HCT: 47 % (ref 39.0–52.0)
Hemoglobin: 15.6 g/dL (ref 13.0–17.0)
MCH: 29 pg (ref 26.0–34.0)
MCHC: 33.2 g/dL (ref 30.0–36.0)
MCV: 87.4 fL (ref 80.0–100.0)
Platelets: 343 10*3/uL (ref 150–400)
RBC: 5.38 MIL/uL (ref 4.22–5.81)
RDW: 13.2 % (ref 11.5–15.5)
WBC: 10.5 10*3/uL (ref 4.0–10.5)
nRBC: 0 % (ref 0.0–0.2)

## 2022-07-08 LAB — BASIC METABOLIC PANEL
Anion gap: 10 (ref 5–15)
BUN: 11 mg/dL (ref 6–20)
CO2: 21 mmol/L — ABNORMAL LOW (ref 22–32)
Calcium: 8.4 mg/dL — ABNORMAL LOW (ref 8.9–10.3)
Chloride: 105 mmol/L (ref 98–111)
Creatinine, Ser: 0.9 mg/dL (ref 0.61–1.24)
GFR, Estimated: >60 mL/min (ref >60)
Glucose, Bld: 179 mg/dL — ABNORMAL HIGH (ref 70–99)
Potassium: 3.5 mmol/L (ref 3.5–5.1)
Sodium: 136 mmol/L (ref 135–145)

## 2022-07-08 MED ORDER — HYDROCODONE-ACETAMINOPHEN 5-325 MG PO TABS
1.0000 | ORAL_TABLET | Freq: Once | ORAL | Status: AC
  Administered 2022-07-08: 1 via ORAL
  Filled 2022-07-08: qty 1

## 2022-07-08 MED ORDER — LEVOFLOXACIN 500 MG PO TABS: 500.0000 mg | ORAL_TABLET | Freq: Every day | ORAL | 0 refills | Status: DC

## 2022-07-08 MED ORDER — ETODOLAC 300 MG PO CAPS: 300.0000 mg | ORAL_CAPSULE | Freq: Three times a day (TID) | ORAL | 0 refills | Status: AC

## 2022-07-08 MED ORDER — ACETAMINOPHEN 500 MG PO TABS
1000.0000 mg | ORAL_TABLET | Freq: Once | ORAL | Status: AC
Start: 2022-07-08 — End: 2022-07-08
  Administered 2022-07-08: 1000 mg via ORAL
  Filled 2022-07-08: qty 2

## 2022-07-08 NOTE — ED Notes (Signed)
ED Provider at bedside. 

## 2022-07-08 NOTE — ED Notes (Signed)
PIV placed, bloodwork sent to lab. Pt laying on stretcher nad

## 2022-07-08 NOTE — ED Notes (Signed)
Pt brought back to room, changed into hospital gown. Redness on right arm marked by patient last night. Call bell within reach.

## 2022-07-08 NOTE — ED Notes (Signed)
After PIV removal, pt requesting to talk to ED MD once more before leaving due to noticing his right upper arm red and swelling. MD Tomi Bamberger in room talking to patient.

## 2022-07-08 NOTE — ED Notes (Signed)
Patient given dc paperwork and verb understanding. Ual to waiting room.

## 2022-07-08 NOTE — ED Triage Notes (Signed)
Patient has had right elbow redness and swelling. Stated he thinks he has cellulitis. Painful to touch. Since 1pm yesterday patient has lost feeling in his right hand.

## 2022-07-08 NOTE — Discharge Instructions (Addendum)
Take the medications as prescribed to help with the pain and inflammation in your elbow.  Follow-up with an orthopedic doctor for further evaluation.  Follow-up with a primary care doctor as we discussed

## 2022-07-08 NOTE — ED Provider Notes (Addendum)
[place] EMERGENCY DEPARTMENT AT [facility] Provider Note   CSN: [id number] Arrival date & time: 07/08/22  [id number]     History  Chief Complaint  Patient presents with   Arm Swelling    Jacob Nixon. is a 32 y.o. male.  HPI   Patient presents to the ED for evaluation of right elbow pain.  Patient states he has had some recurrent issues of pain in his elbow.  At times he was diagnosed with cellulitis but he states when he saw infectious disease physicians they told him he did not have cellulitis.  He does not have any history of DVT or PE.  Patient states last night he noticed more redness and swelling in the medial aspect of his right elbow area.  The swelling and redness has decreased this morning.  He feels like his fingers are numb in his right hand.  He denies any recent falls or injuries  Home Medications Prior to Admission medications   Medication Sig Start Date End Date Taking? Authorizing Provider  etodolac (LODINE) 300 MG capsule Take 1 capsule (300 mg total) by mouth every 8 (eight) hours for 10 days. 07/08/22 07/18/22 Yes Jacob Rank, MD  ondansetron (ZOFRAN-ODT) 8 MG disintegrating tablet Take 1 tablet (8 mg total) by mouth every 8 (eight) hours as needed for nausea or vomiting. 03/25/22   Sherrill Raring, PA-C  oxyCODONE-acetaminophen (PERCOCET/ROXICET) 5-325 MG tablet Take 1 tablet by mouth every 6 (six) hours as needed for severe pain. 03/25/22   Sherrill Raring, PA-C  tamsulosin (FLOMAX) 0.4 MG CAPS capsule Take 1 capsule (0.4 mg total) by mouth daily after supper. 03/25/22   Sherrill Raring, PA-C  ZYRTEC ALLERGY 10 MG tablet Take 10 mg by mouth daily as needed for allergies or rhinitis.    [provider]      Allergies    Fentanyl, Coconut flavor, Penicillins, and Latex    Review of Systems   Review of Systems  Physical Exam Updated Vital Signs BP 112/72   Pulse 85   Temp 98.3 F (36.8 C) (Oral)   Resp 19   Ht 1.803 m (5' 11"$ )   Wt 117.9 kg   SpO2  98%   BMI 36.26 kg/m  Physical Exam Vitals and nursing note reviewed.  Constitutional:      General: He is not in acute distress.    Appearance: He is well-developed.  HENT:     Head: Normocephalic and atraumatic.     Right Ear: External ear normal.     Left Ear: External ear normal.  Eyes:     General: No scleral icterus.       Right eye: No discharge.        Left eye: No discharge.     Conjunctiva/sclera: Conjunctivae normal.  Neck:     Trachea: No tracheal deviation.  Cardiovascular:     Rate and Rhythm: Normal rate.  Pulmonary:     Effort: Pulmonary effort is normal. No respiratory distress.     Breath sounds: No stridor.  Abdominal:     General: There is no distension.  Musculoskeletal:        General: No swelling or deformity.     Cervical back: Neck supple.  Skin:    General: Skin is warm and dry.     Findings: No rash.  Neurological:     Mental Status: He is alert. Mental status is at baseline.     Cranial Nerves: No dysarthria or facial  asymmetry.     Motor: No seizure activity.     Comments: ?  Decreased grip strength right hand, subjective decrease sensation, mild tenderness medial aspect left antecubital fossa, no induration or erythema     ED Results / Procedures / Treatments   Labs (all labs ordered are listed, but only abnormal results are displayed) Labs Reviewed  BASIC METABOLIC PANEL - Abnormal; Notable for the following components:      Result Value   CO2 21 (*)    Glucose, Bld 179 (*)    Calcium 8.4 (*)    All other components within normal limits  CBC    EKG None  Radiology DG Elbow Complete Right  Result Date: 07/08/2022 CLINICAL DATA:  Elbow pain and swelling EXAM: RIGHT ELBOW - COMPLETE 3+ VIEW COMPARISON:  09/04/2021 FINDINGS: There is no evidence of fracture, dislocation, or joint effusion. Small fragmented osteophyte of the proximal ulna, unchanged. Mild arthropathy of the proximal radioulnar joint. No erosion or periosteal  elevation. Diffuse soft tissue swelling and edema. No soft tissue gas. IMPRESSION: 1. Diffuse soft tissue swelling and edema. 2. No acute osseous abnormality.  No joint effusion. 3. Mild degenerative changes of the right elbow. Electronically Signed   By: Davina Poke D.O.   On: 07/08/2022 09:23    Procedures Procedures    Medications Ordered in ED Medications  acetaminophen (TYLENOL) tablet 1,000 mg (1,000 mg Oral Given 07/08/22 [id number])    ED Course/ Medical Decision Making/ A&P Clinical Course as of 07/08/22 1014  Mon Jul 08, 2022  0937 X-ray shows no acute bony abnormality, soft tissue swelling, Doppler study negative for DVT [JK]  [id number] CBC and metabolic panel normal. [JK]    Clinical Course User Index [JK] Jacob Rank, MD                             Medical Decision Making Problems Addressed: Elbow pain, right: acute illness or injury that poses a threat to life or bodily functions  Amount and/or Complexity of Data Reviewed Labs: ordered. Decision-making details documented in ED Course. Radiology: ordered and independent interpretation performed.  Risk OTC drugs. Prescription drug management.   Pt presented to the ED with left elbow pain.  Patient previously was diagnosed with cellulitis.  He states that when he saw infectious disease however they told him he did not have an infection.  Sounds like he has had some recurrent issues of joint inflammation.  Possible he could have an underlying rheumatologic condition.  His complaints right now are suggestive of possible inflammatory tendinitis bursitis.  Nerve impingement is also a possibility with his complaints of numbness and tingling in his hand.  He is not having any neck pain or upper arm pain.  I am more suspicious of peripheral neuropathy.  There is no findings to suggest acute infection.  Does not have evidence of DVT.  Will plan on discharge home with NSAIDs outpatient orthopedic follow-up        Final Clinical  Impression(s) / ED Diagnoses Final diagnoses:  Elbow pain, right    Rx / DC Orders ED Discharge Orders          Ordered    etodolac (LODINE) 300 MG capsule  Every 8 hours       Note to Pharmacy: As needed for pain   07/08/22 1014              Jacob Rank, MD 07/08/22 1017  Patient called me back to the room because he has noticed some increasing redness now in the medial aspect of his left arm.  He is concerned that he may be developing infection.  Certainly cannot rule out cellulitis although this would be atypical for him to have recurrent episodes in the same area.  Is concerned because he has been admitted to the hospital in the past for cellulitis.  Will go ahead and start him on a course of antibiotics.  Stressed the importance of outpatient follow-up with an orthopedic and primary care doctor.   Jacob Rank, MD 07/08/22 1036

## 2022-09-04 ENCOUNTER — Emergency Department (HOSPITAL_COMMUNITY)
Admission: EM | Admit: 2022-09-04 | Discharge: 2022-09-04 | Disposition: A | Discharge status: Home / Self Care | Payer: BC Managed Care – PPO | Attending: Emergency Medicine | Admitting: Emergency Medicine

## 2022-09-04 ENCOUNTER — Other Ambulatory Visit: Payer: Self-pay

## 2022-09-04 ENCOUNTER — Encounter (HOSPITAL_COMMUNITY): Payer: Self-pay

## 2022-09-04 DIAGNOSIS — Z91040 Latex allergy status: Secondary | ICD-10-CM | POA: Insufficient documentation

## 2022-09-04 DIAGNOSIS — M25532 Pain in left wrist: Secondary | ICD-10-CM | POA: Diagnosis present

## 2022-09-04 DIAGNOSIS — M7989 Other specified soft tissue disorders: Secondary | ICD-10-CM | POA: Insufficient documentation

## 2022-09-04 LAB — BASIC METABOLIC PANEL
Anion gap: 9 (ref 5–15)
BUN: 12 mg/dL (ref 6–20)
CO2: 20 mmol/L — ABNORMAL LOW (ref 22–32)
Calcium: 8.3 mg/dL — ABNORMAL LOW (ref 8.9–10.3)
Chloride: 107 mmol/L (ref 98–111)
Creatinine, Ser: 0.95 mg/dL (ref 0.61–1.24)
GFR, Estimated: >60 mL/min (ref >60)
Glucose, Bld: 122 mg/dL — ABNORMAL HIGH (ref 70–99)
Potassium: 3.8 mmol/L (ref 3.5–5.1)
Sodium: 136 mmol/L (ref 135–145)

## 2022-09-04 LAB — CBC WITH DIFFERENTIAL/PLATELET
Abs Immature Granulocytes: 0.04 10*3/uL (ref 0.00–0.07)
Basophils Absolute: 0.1 10*3/uL (ref 0.0–0.1)
Basophils Relative: 0 %
Eosinophils Absolute: 0.2 10*3/uL (ref 0.0–0.5)
Eosinophils Relative: 2 %
HCT: 46.7 % (ref 39.0–52.0)
Hemoglobin: 15.5 g/dL (ref 13.0–17.0)
Immature Granulocytes: 0 %
Lymphocytes Relative: 20 %
Lymphs Abs: 2.7 10*3/uL (ref 0.7–4.0)
MCH: 28.9 pg (ref 26.0–34.0)
MCHC: 33.2 g/dL (ref 30.0–36.0)
MCV: 87 fL (ref 80.0–100.0)
Monocytes Absolute: 1.4 10*3/uL — ABNORMAL HIGH (ref 0.1–1.0)
Monocytes Relative: 11 %
Neutro Abs: 9.1 10*3/uL — ABNORMAL HIGH (ref 1.7–7.7)
Neutrophils Relative %: 67 %
Platelets: 333 10*3/uL (ref 150–400)
RBC: 5.37 MIL/uL (ref 4.22–5.81)
RDW: 13.5 % (ref 11.5–15.5)
WBC: 13.5 10*3/uL — ABNORMAL HIGH (ref 4.0–10.5)
nRBC: 0 % (ref 0.0–0.2)

## 2022-09-04 LAB — URIC ACID: Uric Acid, Serum: 7 mg/dL (ref 3.7–8.6)

## 2022-09-04 MED ORDER — IBUPROFEN 800 MG PO TABS: 800.0000 mg | ORAL_TABLET | Freq: Three times a day (TID) | ORAL | 0 refills | Status: DC

## 2022-09-04 MED ORDER — CLINDAMYCIN HCL 150 MG PO CAPS: 300.0000 mg | ORAL_CAPSULE | Freq: Three times a day (TID) | ORAL | 0 refills | Status: DC

## 2022-09-04 MED ORDER — CLINDAMYCIN HCL 300 MG PO CAPS
300.0000 mg | ORAL_CAPSULE | Freq: Once | ORAL | Status: AC
  Administered 2022-09-04: 300 mg via ORAL
  Filled 2022-09-04: qty 1

## 2022-09-04 MED ORDER — IBUPROFEN 800 MG PO TABS
800.0000 mg | ORAL_TABLET | Freq: Once | ORAL | Status: AC
  Administered 2022-09-04: 800 mg via ORAL
  Filled 2022-09-04: qty 1

## 2022-09-04 MED ORDER — OXYCODONE-ACETAMINOPHEN 5-325 MG PO TABS
1.0000 | ORAL_TABLET | Freq: Once | ORAL | Status: AC
  Administered 2022-09-04: 1 via ORAL
  Filled 2022-09-04: qty 1

## 2022-09-04 NOTE — ED Triage Notes (Signed)
Left arm swelling that suddenly appeared ~930Pm last night.   Says he has an extensive history of same but usually on right arm.

## 2022-09-04 NOTE — ED Provider Notes (Signed)
[facility]PARTMENT AT [facility] Provider Note   CSN: [id number] Arrival date & time: 09/04/22  0209     History  Chief Complaint  Patient presents with   Arm Swelling    Jacob Nixon. is a 32 y.o. male.  The history is provided by the patient and medical records.   32 year old male presenting to the ED with left arm/wrist pain that began around 9:30PM last evening.  States recurrent hx of same, but usually involving his right arm/elbow.  He denies any injury, trauma, or falls.  He did work in his car over the weekend but was not doing any repetitive activity.  He states in 2020 he did undergo extensive workup for this condition with rheumatology, no identifiable cause was found.  Recently evaluated by hand surgery (Dr. Frazier Butt) who recommended MRI, however unable to get this done.  He has been seen by infectious disease who did not feel that he had cellulitis.  Patient states he is just frustrated and wants some answers.  He denies any fevers.  Home Medications Prior to Admission medications   Medication Sig Start Date End Date Taking? Authorizing Provider  levofloxacin (LEVAQUIN) 500 MG tablet Take 1 tablet (500 mg total) by mouth daily. 07/08/22   Linwood Dibbles, MD  ondansetron (ZOFRAN-ODT) 8 MG disintegrating tablet Take 1 tablet (8 mg total) by mouth every 8 (eight) hours as needed for nausea or vomiting. 03/25/22   Theron Arista, PA-C  oxyCODONE-acetaminophen (PERCOCET/ROXICET) 5-325 MG tablet Take 1 tablet by mouth every 6 (six) hours as needed for severe pain. 03/25/22   Theron Arista, PA-C  tamsulosin (FLOMAX) 0.4 MG CAPS capsule Take 1 capsule (0.4 mg total) by mouth daily after supper. 03/25/22   Theron Arista, PA-C  ZYRTEC ALLERGY 10 MG tablet Take 10 mg by mouth daily as needed for allergies or rhinitis.    [provider]      Allergies    Fentanyl, Coconut flavor, Penicillins, and Latex    Review of Systems   Review of Systems   Musculoskeletal:  Positive for arthralgias.  All other systems reviewed and are negative.   Physical Exam Updated Vital Signs BP (!) 152/116 (BP Location: Left Arm)   Pulse (!) 110   Temp 98.7 F (37.1 C) (Oral)   Resp 18   Ht  (1.803 m)   Wt 117.9 kg   SpO2 100%   BMI 36.26 kg/m   Physical Exam Vitals and nursing note reviewed.  Constitutional:      Appearance: He is well-developed.  HENT:     Head: Normocephalic and atraumatic.  Eyes:     Conjunctiva/sclera: Conjunctivae normal.     Pupils: Pupils are equal, round, and reactive to light.  Cardiovascular:     Rate and Rhythm: Normal rate and regular rhythm.     Heart sounds: Normal heart sounds.  Pulmonary:     Effort: Pulmonary effort is normal.     Breath sounds: Normal breath sounds.  Abdominal:     General: Bowel sounds are normal.     Palpations: Abdomen is soft.  Musculoskeletal:        General: Normal range of motion.     Cervical back: Normal range of motion.     Comments: Swelling noted to left wrist, there does not appear to be any erythema/induration, no warmth to touch noted; he does have pain with ROM of left wrist but PROM is WNL; radial pulse intact, normal cap  refill, decreased sensation to left thumb, index, and ring fingers which is baseline due to prior carpal tunnel, normal sensation of ring and little fingers  Skin:    General: Skin is warm and dry.  Neurological:     Mental Status: He is alert and oriented to person, place, and time.      ED Results / Procedures / Treatments   Labs (all labs ordered are listed, but only abnormal results are displayed) Labs Reviewed  CBC WITH DIFFERENTIAL/PLATELET - Abnormal; Notable for the following components:      Result Value   WBC 13.5 (*)    Neutro Abs 9.1 (*)    Monocytes Absolute 1.4 (*)    All other components within normal limits  BASIC METABOLIC PANEL - Abnormal; Notable for the following components:   CO2 20 (*)    Glucose, Bld 122  (*)    Calcium 8.3 (*)    All other components within normal limits  CULTURE, BLOOD (ROUTINE X 2)  CULTURE, BLOOD (ROUTINE X 2)  URIC ACID    EKG None  Radiology No results found.  Procedures Procedures    Medications Ordered in ED Medications  clindamycin (CLEOCIN) capsule 300 mg (has no administration in time range)  ibuprofen (ADVIL) tablet 800 mg (has no administration in time range)  oxyCODONE-acetaminophen (PERCOCET/ROXICET) 5-325 MG per tablet 1 tablet (1 tablet Oral Given 09/04/22 0419)    ED Course/ Medical Decision Making/ A&P                             Medical Decision Making Amount and/or Complexity of Data Reviewed Labs: ordered. ECG/medicine tests: ordered and independent interpretation performed.  Risk Prescription drug management.   32 y.o. M here with left wrist pain/swelling.  Hx of recurrent issues similarly in the past, usually affecting right elbow/arm.  Has had prior work-up with rheumatology without identifiable cause.  He has been admitted several times for suspected cellulitis, however infectious disease felt otherwise.  Symptoms and left wrist began yesterday evening.  Does admit to working on his car over the weekend but denies any repetitive motions.  He is afebrile and nontoxic in appearance here.  He does not have any significant erythema, induration, or warmth to touch of the left breast.  There is some mild soft tissue swelling.  Pain with range of motion of the wrist, however passively able to range her wrist without difficulty.  He has chronic decreased sensation of left thumb, index, and middle finger which is unchanged.  His hand is warm and well-perfused.  Labs as above--does have leukocytosis at 13.5.  No significant electrolyte derangement.  Uric acid is normal.  Bedside US performed by Dr. Eudelia Bunch-- appears to have some inflammation/fluid built up around tendon of volar wrist.  He was working on his car over the weekend, possibly  inciting event.  He does have leukocytosis, but as he is afebrile without signs of toxicity, do not feel he needs admission at this time. Not consistent with septic joint. We will send set of blood cultures, start clindamycin.  He has been seen by hand surgery at [facility], Dr. Frazier Butt, recently so will have him follow-up in clinic.   He can return here for any new/acute changes.  Final Clinical Impression(s) / ED Diagnoses Final diagnoses:  Left wrist pain    Rx / DC Orders ED Discharge Orders          Ordered  clindamycin (CLEOCIN) 150 MG capsule  3 times daily        09/04/22 0602    ibuprofen (ADVIL) 800 MG tablet  3 times daily        09/04/22 0602              Garlon Hatchet, PA-C 09/04/22 2956    Nira Conn, MD 09/04/22 1910

## 2022-09-04 NOTE — Discharge Instructions (Addendum)
Take the prescribed medication as directed.  Try to rest the wrist as much as you can. Follow-up with Dr. Frazier Butt-- call their office to get appt scheduled. Return to the ED for new or worsening symptoms.

## 2022-09-04 NOTE — ED Notes (Signed)
Patient verbalizes understanding of discharge instructions. Opportunity for questioning and answers were provided. Armband removed by staff, pt discharged from ED. Ambulated out to lobby  

## 2022-09-05 LAB — BLOOD CULTURE ID PANEL (REFLEXED) - BCID2

## 2022-09-05 LAB — CULTURE, BLOOD (ROUTINE X 2)

## 2022-09-05 NOTE — ED Notes (Signed)
Pt notified to come back for his + blood culture results. (09/05/2022, 1610).

## 2022-09-06 LAB — CULTURE, BLOOD (ROUTINE X 2)
Culture: NO GROWTH
Special Requests: ADEQUATE

## 2022-09-07 ENCOUNTER — Telehealth (HOSPITAL_BASED_OUTPATIENT_CLINIC_OR_DEPARTMENT_OTHER): Payer: Self-pay | Admitting: *Deleted

## 2022-09-07 LAB — CULTURE, BLOOD (ROUTINE X 2)

## 2022-09-07 NOTE — Telephone Encounter (Signed)
Post ED Visit - Positive Culture Follow-up  Culture report reviewed by antimicrobial stewardship pharmacist: [facility]  Enzo Bi, Pharm.D.  Celedonio Miyamoto, Pharm.D., BCPS AQ-ID  Garvin Fila, Pharm.D., BCPS  Georgina Pillion, [address].D., BCPS  [place], [address].D., BCPS, AAHIVP  Estella Husk, Pharm.D., BCPS, AAHIVP  Lysle Pearl, PharmD, BCPS  Phillips Climes, PharmD, BCPS  Agapito Games, PharmD, BCPS  Verlan Friends, PharmD  Mervyn Gay, PharmD, BCPS  Vinnie Level, PharmD  [facility]  Len Childs, PharmD  Greer Pickerel, PharmD  Adalberto Cole, PharmD  Perlie Gold, Rph  Lonell Face) Jean Rosenthal, PharmD  Earl Many, PharmD  Junita Push, PharmD  Dorna Leitz, PharmD  Terrilee Files, PharmD  Lynann Beaver, PharmD  Keturah Barre, PharmD  Loralee Pacas, PharmD  Misty Stanley, PharmD   Positive blood culture Treated with Clindamycin. Note in chart that patient was notified to come back to E for + blood cultures 4/16. No further action needed  Patsey Berthold 09/07/2022, 9:31 AM

## 2022-09-09 LAB — CULTURE, BLOOD (ROUTINE X 2)

## 2022-12-13 ENCOUNTER — Other Ambulatory Visit: Payer: Self-pay

## 2022-12-13 ENCOUNTER — Encounter (HOSPITAL_BASED_OUTPATIENT_CLINIC_OR_DEPARTMENT_OTHER): Payer: Self-pay | Admitting: Orthopedic Surgery

## 2022-12-13 NOTE — Progress Notes (Signed)
Spoke with Lynnea Ferrier, at Dr. Georgia Duff office to notify that we have been unable to reach patient. She stated that patient told her he was getting married last weekend and to please call him back on Monday (August 5th).

## 2022-12-18 ENCOUNTER — Encounter (HOSPITAL_BASED_OUTPATIENT_CLINIC_OR_DEPARTMENT_OTHER): Payer: Self-pay | Admitting: Orthopedic Surgery

## 2022-12-18 ENCOUNTER — Ambulatory Visit (HOSPITAL_BASED_OUTPATIENT_CLINIC_OR_DEPARTMENT_OTHER): Payer: BC Managed Care – PPO | Admitting: Certified Registered"

## 2022-12-18 ENCOUNTER — Ambulatory Visit (HOSPITAL_BASED_OUTPATIENT_CLINIC_OR_DEPARTMENT_OTHER)
Admission: RE | Admit: 2022-12-18 | Discharge: 2022-12-18 | Disposition: A | Discharge status: Home / Self Care | Payer: BC Managed Care – PPO | Source: Home / Self Care | Attending: Orthopedic Surgery | Admitting: Orthopedic Surgery

## 2022-12-18 ENCOUNTER — Other Ambulatory Visit: Payer: Self-pay

## 2022-12-18 ENCOUNTER — Encounter (HOSPITAL_BASED_OUTPATIENT_CLINIC_OR_DEPARTMENT_OTHER)
Admission: RE | Disposition: A | Discharge status: Home / Self Care | Payer: Self-pay | Source: Home / Self Care | Attending: Orthopedic Surgery

## 2022-12-18 DIAGNOSIS — M659 Synovitis and tenosynovitis, unspecified: Secondary | ICD-10-CM | POA: Insufficient documentation

## 2022-12-18 DIAGNOSIS — G5601 Carpal tunnel syndrome, right upper limb: Secondary | ICD-10-CM | POA: Insufficient documentation

## 2022-12-18 DIAGNOSIS — G709 Myoneural disorder, unspecified: Secondary | ICD-10-CM | POA: Diagnosis not present

## 2022-12-18 DIAGNOSIS — Z01818 Encounter for other preprocedural examination: Secondary | ICD-10-CM

## 2022-12-18 DIAGNOSIS — Z87891 Personal history of nicotine dependence: Secondary | ICD-10-CM | POA: Diagnosis not present

## 2022-12-18 HISTORY — PX: HYPOTHENAR FAT PAD TRANSFER: SHX6408

## 2022-12-18 HISTORY — PX: CARPAL TUNNEL RELEASE: SHX101

## 2022-12-18 HISTORY — DX: Other complications of anesthesia, initial encounter: T88.59XA

## 2022-12-18 SURGERY — CARPAL TUNNEL RELEASE
Anesthesia: General | Site: Wrist | Laterality: Right

## 2022-12-18 MED ORDER — DEXAMETHASONE SODIUM PHOSPHATE 10 MG/ML IJ SOLN
INTRAMUSCULAR | Status: DC | PRN
  Administered 2022-12-18: 10 mg via INTRAVENOUS

## 2022-12-18 MED ORDER — OXYCODONE HCL 5 MG PO TABS: 5.0000 mg | ORAL_TABLET | Freq: Four times a day (QID) | ORAL | 0 refills | Status: AC | PRN

## 2022-12-18 MED ORDER — 0.9 % SODIUM CHLORIDE (POUR BTL) OPTIME
TOPICAL | Status: DC | PRN
  Administered 2022-12-18: 120 mL

## 2022-12-18 MED ORDER — FENTANYL CITRATE (PF) 100 MCG/2ML IJ SOLN
INTRAMUSCULAR | Status: AC
  Filled 2022-12-18: qty 2

## 2022-12-18 MED ORDER — KETOROLAC TROMETHAMINE 30 MG/ML IJ SOLN
30.0000 mg | Freq: Once | INTRAMUSCULAR | Status: AC | PRN
  Administered 2022-12-18: 30 mg via INTRAVENOUS

## 2022-12-18 MED ORDER — ONDANSETRON HCL 4 MG/2ML IJ SOLN
INTRAMUSCULAR | Status: DC | PRN
  Administered 2022-12-18: 4 mg via INTRAVENOUS

## 2022-12-18 MED ORDER — KETOROLAC TROMETHAMINE 30 MG/ML IJ SOLN
INTRAMUSCULAR | Status: AC
  Filled 2022-12-18: qty 1

## 2022-12-18 MED ORDER — LACTATED RINGERS IV SOLN: INTRAVENOUS | Status: DC

## 2022-12-18 MED ORDER — PROPOFOL 10 MG/ML IV BOLUS
INTRAVENOUS | Status: AC
  Filled 2022-12-18: qty 20

## 2022-12-18 MED ORDER — CEFAZOLIN IN SODIUM CHLORIDE 3-0.9 GM/100ML-% IV SOLN
3.0000 g | INTRAVENOUS | Status: AC
  Administered 2022-12-18: 3 g via INTRAVENOUS

## 2022-12-18 MED ORDER — DEXAMETHASONE SODIUM PHOSPHATE 10 MG/ML IJ SOLN
INTRAMUSCULAR | Status: AC
  Filled 2022-12-18: qty 1

## 2022-12-18 MED ORDER — MIDAZOLAM HCL 2 MG/2ML IJ SOLN
INTRAMUSCULAR | Status: AC
  Filled 2022-12-18: qty 2

## 2022-12-18 MED ORDER — OXYCODONE HCL 5 MG/5ML PO SOLN: 5.0000 mg | Freq: Once | ORAL | Status: AC | PRN

## 2022-12-18 MED ORDER — CEFAZOLIN IN SODIUM CHLORIDE 3-0.9 GM/100ML-% IV SOLN
INTRAVENOUS | Status: AC
  Filled 2022-12-18: qty 100

## 2022-12-18 MED ORDER — PROPOFOL 10 MG/ML IV BOLUS
INTRAVENOUS | Status: DC | PRN
  Administered 2022-12-18: 200 mg via INTRAVENOUS

## 2022-12-18 MED ORDER — BUPIVACAINE HCL (PF) 0.25 % IJ SOLN
INTRAMUSCULAR | Status: DC | PRN
Start: 2022-12-18 — End: 2022-12-18
  Administered 2022-12-18: 20 mL

## 2022-12-18 MED ORDER — LIDOCAINE HCL (CARDIAC) PF 100 MG/5ML IV SOSY
PREFILLED_SYRINGE | INTRAVENOUS | Status: DC | PRN
  Administered 2022-12-18: 60 mg via INTRAVENOUS

## 2022-12-18 MED ORDER — OXYCODONE HCL 5 MG PO TABS
5.0000 mg | ORAL_TABLET | Freq: Once | ORAL | Status: AC | PRN
  Administered 2022-12-18: 5 mg via ORAL

## 2022-12-18 MED ORDER — ONDANSETRON HCL 4 MG/2ML IJ SOLN
INTRAMUSCULAR | Status: AC
  Filled 2022-12-18: qty 2

## 2022-12-18 MED ORDER — ACETAMINOPHEN 500 MG PO TABS
1000.0000 mg | ORAL_TABLET | Freq: Once | ORAL | Status: AC
  Administered 2022-12-18: 1000 mg via ORAL

## 2022-12-18 MED ORDER — LIDOCAINE 2% (20 MG/ML) 5 ML SYRINGE
INTRAMUSCULAR | Status: AC
  Filled 2022-12-18: qty 5

## 2022-12-18 MED ORDER — MIDAZOLAM HCL 5 MG/5ML IJ SOLN
INTRAMUSCULAR | Status: DC | PRN
  Administered 2022-12-18: 2 mg via INTRAVENOUS

## 2022-12-18 MED ORDER — PROMETHAZINE HCL 25 MG/ML IJ SOLN
INTRAMUSCULAR | Status: AC
  Filled 2022-12-18: qty 1

## 2022-12-18 MED ORDER — ACETAMINOPHEN 500 MG PO TABS
ORAL_TABLET | ORAL | Status: AC
  Filled 2022-12-18: qty 2

## 2022-12-18 MED ORDER — FENTANYL CITRATE (PF) 100 MCG/2ML IJ SOLN
INTRAMUSCULAR | Status: DC | PRN
  Administered 2022-12-18: 50 ug via INTRAVENOUS

## 2022-12-18 MED ORDER — FENTANYL CITRATE (PF) 100 MCG/2ML IJ SOLN
25.0000 ug | INTRAMUSCULAR | Status: DC | PRN
  Administered 2022-12-18 (×2): 50 ug via INTRAVENOUS

## 2022-12-18 MED ORDER — OXYCODONE HCL 5 MG PO TABS
ORAL_TABLET | ORAL | Status: AC
  Filled 2022-12-18: qty 1

## 2022-12-18 MED ORDER — AMISULPRIDE (ANTIEMETIC) 5 MG/2ML IV SOLN: 10.0000 mg | Freq: Once | INTRAVENOUS | Status: DC | PRN

## 2022-12-18 MED ORDER — PROMETHAZINE HCL 25 MG/ML IJ SOLN
6.2500 mg | INTRAMUSCULAR | Status: DC | PRN
  Administered 2022-12-18: 6.25 mg via INTRAVENOUS

## 2022-12-18 SURGICAL SUPPLY — 38 items
APL PRP STRL LF DISP 70% ISPRP (MISCELLANEOUS) ×1
BLADE SURG 15 STRL LF DISP TIS (BLADE) ×2 IMPLANT
BLADE SURG 15 STRL SS (BLADE) ×1
BNDG CMPR 5X3 KNIT ELC UNQ LF (GAUZE/BANDAGES/DRESSINGS) ×1
BNDG CMPR 5X4 KNIT ELC UNQ LF (GAUZE/BANDAGES/DRESSINGS) ×1
BNDG CMPR 9X4 STRL LF SNTH (GAUZE/BANDAGES/DRESSINGS) ×1
BNDG ELASTIC 3INX 5YD STR LF (GAUZE/BANDAGES/DRESSINGS) ×2 IMPLANT
BNDG ELASTIC 4INX 5YD STR LF (GAUZE/BANDAGES/DRESSINGS) IMPLANT
BNDG ESMARK 4X9 LF (GAUZE/BANDAGES/DRESSINGS) ×2 IMPLANT
BNDG GAUZE DERMACEA FLUFF 4 (GAUZE/BANDAGES/DRESSINGS) ×2 IMPLANT
BNDG GZE DERMACEA 4 6PLY (GAUZE/BANDAGES/DRESSINGS) ×1
CHLORAPREP W/TINT 26 (MISCELLANEOUS) ×2 IMPLANT
CORD BIPOLAR FORCEPS 12FT (ELECTRODE) ×2 IMPLANT
COVER BACK TABLE 60X90IN (DRAPES) ×2 IMPLANT
CUFF TOURN SGL QUICK 24 (TOURNIQUET CUFF) ×1
CUFF TRNQT CYL 24X4X16.5-23 (TOURNIQUET CUFF) IMPLANT
DRAPE EXTREMITY T 121X128X90 (DISPOSABLE) ×2 IMPLANT
DRAPE SURG 17X23 STRL (DRAPES) ×2 IMPLANT
GAUZE XEROFORM 1X8 LF (GAUZE/BANDAGES/DRESSINGS) ×2 IMPLANT
GLOVE BIOGEL PI IND STRL 7.0 (GLOVE) ×2 IMPLANT
GLOVE BIOGEL PI IND STRL 7.5 (GLOVE) IMPLANT
GLOVE SURG SS PI 7.0 STRL IVOR (GLOVE) IMPLANT
GOWN STRL REUS W/ TWL LRG LVL3 (GOWN DISPOSABLE) ×4 IMPLANT
GOWN STRL REUS W/TWL LRG LVL3 (GOWN DISPOSABLE) ×2
NDL HYPO 25X1 1.5 SAFETY (NEEDLE) ×2 IMPLANT
NEEDLE HYPO 25X1 1.5 SAFETY (NEEDLE) ×1
NS IRRIG 1000ML POUR BTL (IV SOLUTION) ×2 IMPLANT
PACK BASIN DAY SURGERY FS (CUSTOM PROCEDURE TRAY) ×2 IMPLANT
PAD CAST 4YDX4 CTTN HI CHSV (CAST SUPPLIES) IMPLANT
PADDING CAST COTTON 4X4 STRL (CAST SUPPLIES) ×1
SHEET MEDIUM DRAPE 40X70 STRL (DRAPES) ×2 IMPLANT
SLEEVE SCD COMPRESS KNEE MED (STOCKING) IMPLANT
SPLINT FIBERGLASS 4X30 (CAST SUPPLIES) IMPLANT
SUT ETHILON 4 0 PS 2 18 (SUTURE) ×2 IMPLANT
SYR BULB EAR ULCER 3OZ GRN STR (SYRINGE) ×2 IMPLANT
SYR CONTROL 10ML LL (SYRINGE) ×2 IMPLANT
TOWEL GREEN STERILE FF (TOWEL DISPOSABLE) ×4 IMPLANT
UNDERPAD 30X36 HEAVY ABSORB (UNDERPADS AND DIAPERS) ×2 IMPLANT

## 2022-12-18 NOTE — Anesthesia Preprocedure Evaluation (Signed)
Anesthesia Evaluation  Patient identified by MRN, date of birth, ID band Patient awake    Reviewed: Allergy & Precautions, NPO status , Patient's Chart, lab work & pertinent test results  Airway Mallampati: II  TM Distance: >3 FB Neck ROM: Full    Dental  (+) Chipped, Missing   Pulmonary Patient abstained from smoking., former smoker   Pulmonary exam normal        Cardiovascular negative cardio ROS Normal cardiovascular exam     Neuro/Psych  Neuromuscular disease  negative psych ROS   GI/Hepatic negative GI ROS, Neg liver ROS,,,  Endo/Other  negative endocrine ROS    Renal/GU Renal disease     Musculoskeletal negative musculoskeletal ROS (+)    Abdominal  (+) + obese  Peds  Hematology negative hematology ROS (+)   Anesthesia Other Findings Right carpal tunnel syndrome  Reproductive/Obstetrics                             Anesthesia Physical Anesthesia Plan  ASA: 2  Anesthesia Plan: General   Post-op Pain Management:    Induction: Intravenous  PONV Risk Score and Plan: 2 and Ondansetron, Dexamethasone, Midazolam and Treatment may vary due to age or medical condition  Airway Management Planned: LMA  Additional Equipment:   Intra-op Plan:   Post-operative Plan: Extubation in OR  Informed Consent: I have reviewed the patients History and Physical, chart, labs and discussed the procedure including the risks, benefits and alternatives for the proposed anesthesia with the patient or authorized representative who has indicated his/her understanding and acceptance.     Dental advisory given  Plan Discussed with: CRNA  Anesthesia Plan Comments:        Anesthesia Quick Evaluation

## 2022-12-18 NOTE — H&P (Signed)
HAND SURGERY   HPI: Patient is a 32 y.o. male who presents with numbness, paresthesias, and aching pain in the palm.  The numbness and paresthesias are in the median nerve distribution.  He has undergone two carpal tunnel releases previusly with the most recent being approximately 4 years ago.  He got relief from the first procedure but not the second.  EMG/NCS confirmed moderate CTS.  He has failed extensive nonsurgical management with activity modification, bracing, and corticosteroid injection.  The injection provided symptom relief for 3-4 weeks.  Patient denies any changes to their medical history or new systemic symptoms today.    Past Medical History:  Diagnosis Date   Abscess of upper arm    Bronchitis    Cellulitis of forearm, right 07/30/2015   Cellulitis of hand 08/04/2016   Cellulitis of right hand    Cellulitis of right hand - RECURRENT    Complication of anesthesia    woke up during sx one time   Kidney stones    Past Surgical History:  Procedure Laterality Date   CARPAL TUNNEL RELEASE Left 04/09/2016   Procedure: CARPAL TUNNEL RELEASE;  Surgeon: Cammy Copa, MD;  Location: [facility] OR;  Service: Orthopedics;  Laterality: Left;   CYSTOSCOPY WITH RETROGRADE PYELOGRAM, URETEROSCOPY AND STENT PLACEMENT Bilateral 07/02/2020   Procedure: CYSTOSCOPY WITH RETROGRADE PYELOGRAM, URETEROSCOPY AND STENT PLACEMENT POSSIBLY BILATERAL;  Surgeon: Jannifer Hick, MD;  Location: [facility] ORS;  Service: Urology;  Laterality: Bilateral;   CYSTOSCOPY/URETEROSCOPY/HOLMIUM LASER/STENT PLACEMENT Bilateral 07/06/2020   Procedure: CYSTOSCOPY/URETEROSCOPY/HOLMIUM LASER/LEFT STENT EXCHANGE, RIGHT STENT PLACEMENT;  Surgeon: Vanna Scotland, MD;  Location: [facility] ORS;  Service: Urology;  Laterality: Bilateral;   EXTRACORPOREAL SHOCK WAVE LITHOTRIPSY Left 06/22/2020   Procedure: EXTRACORPOREAL SHOCK WAVE LITHOTRIPSY (ESWL);  Surgeon: Vanna Scotland, MD;  Location: [facility] ORS;  Service: Urology;  Laterality:  Left;   HAND SURGERY     KIDNEY STONE SURGERY     lithortripsy   Social History   Socioeconomic History   Marital status: Married    Spouse name: Not on file   Number of children: Not on file   Years of education: Not on file   Highest education level: Not on file  Occupational History   Not on file  Tobacco Use   Smoking status: Former    Types: Cigarettes   Smokeless tobacco: Never  Vaping Use   Vaping status: Every Day   Substances: Nicotine, Flavoring  Substance and Sexual Activity   Alcohol use: Yes    Comment: occasional   Drug use: No   Sexual activity: Yes    Birth control/protection: None  Other Topics Concern   Not on file  Social History Narrative   Not on file   Social Determinants of Health   Financial Resource Strain: Not on file  Food Insecurity: Not on file  Transportation Needs: Not on file  Physical Activity: Not on file  Stress: Not on file  Social Connections: Unknown (09/22/2021)   [facility]   Social Network    Social Network: Not on file   Family History  Problem Relation Age of Onset   Hypertension Other    Diabetes Father    Healthy Mother    - negative except otherwise stated in the family history section Allergies  Allergen Reactions   Fentanyl Other (See Comments)    Patient prefers to NOT take this   Coconut Flavor Other (See Comments)    Scratchy throat   Penicillins Swelling  and Other (See Comments)    Patient had atypical chest pain with some shortness of breath after a dose of Bicillin CR in January 2020. ED physician noted no signs of allergic reaction at the time. Since that time tolerated Zosyn X 1 dose in the ED 08/22/21     Latex Rash   Prior to Admission medications   Medication Sig Start Date End Date Taking? Authorizing Provider  fexofenadine (ALLEGRA) 180 MG tablet Take 180 mg by mouth daily as needed for allergies or rhinitis.   Yes [provider]  ibuprofen (ADVIL) 800  MG tablet Take 1 tablet (800 mg total) by mouth 3 (three) times daily. 09/04/22  Yes Garlon Hatchet, PA-C  naproxen sodium (ALEVE) 220 MG tablet Take 220 mg by mouth daily as needed (pain).   Yes [provider]  clindamycin (CLEOCIN) 150 MG capsule Take 2 capsules (300 mg total) by mouth 3 (three) times daily. May dispense as 150mg  capsules 09/04/22   Allyne Gee, Rosezella Florida, PA-C  levofloxacin (LEVAQUIN) 500 MG tablet Take 1 tablet (500 mg total) by mouth daily. Patient not taking: Reported on 09/04/2022 07/08/22   Linwood Dibbles, MD  ondansetron (ZOFRAN-ODT) 8 MG disintegrating tablet Take 1 tablet (8 mg total) by mouth every 8 (eight) hours as needed for nausea or vomiting. Patient not taking: Reported on 09/04/2022 03/25/22   Theron Arista, PA-C  oxyCODONE-acetaminophen (PERCOCET/ROXICET) 5-325 MG tablet Take 1 tablet by mouth every 6 (six) hours as needed for severe pain. Patient not taking: Reported on 09/04/2022 03/25/22   Theron Arista, PA-C  tamsulosin (FLOMAX) 0.4 MG CAPS capsule Take 1 capsule (0.4 mg total) by mouth daily after supper. Patient not taking: Reported on 09/04/2022 03/25/22   Theron Arista, PA-C   No results found. - Positive ROS: All other systems have been reviewed and were otherwise negative with the exception of those mentioned in the HPI and as above.  Physical Exam: General: No acute distress, resting comfortably Cardiovascular: BUE warm and well perfused, normal rate Respiratory: Normal WOB on RA Skin: Warm and dry Neurologic: Sensation intact distally Psychiatric: Patient is at baseline mood and affect  Right Upper Extremity  Well healed incision at mid aspect of the palm.  Positive Tinel, Phalen, and Durkan signs in palm.  Intact thenar motor function without atrophy.  SILT m/u/r distribution.  Hand warm and well perfused w/ BCR.    Assessment: Patient presents with persistent right carpal tunnel syndrome that has failed extensive nonsurgical management.  Plan: OR  today for extensile revision right carpal tunnel release with possible hypothenar fat pad flap. We again reviewed the risks of surgery which include, but are not limited to, bleeding, infection, damage to the median or ulnar nerve or it's branches, persistent symptoms, pillar pain, skin necrosis overlying the hypothenar eminence, need for additional surgery.  Informed consent was signed.  All questions were answered.   Marlyne Beards, M.D. [facility] 10:20 AM

## 2022-12-18 NOTE — Interval H&P Note (Signed)
History and Physical Interval Note:  12/18/2022 10:24 AM  Jacob Nixon.  has presented today for surgery, with the diagnosis of Right carpal tunnel syndrome.  The various methods of treatment have been discussed with the patient and family. After consideration of risks, benefits and other options for treatment, the patient has consented to  Procedure(s): REVISION CARPAL TUNNEL RELEASE, HYOTHENAR FAT PAD FLAP (Right) as a surgical intervention.  The patient's history has been reviewed, patient examined, no change in status, stable for surgery.  I have reviewed the patient's chart and labs.  Questions were answered to the patient's satisfaction.     [name]

## 2022-12-18 NOTE — Discharge Instructions (Addendum)
Waylan Rocher, M.D. Hand Surgery  POST-OPERATIVE DISCHARGE INSTRUCTIONS   PRESCRIPTIONS: You may have been given a prescription to be taken as directed for post-operative pain control.  You may also take over the counter ibuprofen/aleve and tylenol for pain. Take this as directed on the packaging. Do not exceed 3000 mg tylenol/acetaminophen in 24 hours.  Ibuprofen 600-800 mg (3-4) tablets by mouth every 6 hours as needed for pain.  OR Aleve 2 tablets by mouth every 12 hours (twice daily) as needed for pain.  AND/OR Tylenol 1000 mg (2 tablets) every 8 hours as needed for pain.  Please use your pain medication carefully, as refills are limited and you may not be provided with one.  As stated above, please use over the counter pain medicine - it will also be helpful with decreasing your swelling.    ANESTHESIA: After your surgery, post-surgical discomfort or pain is likely. This discomfort can last several days to a few weeks. At certain times of the day your discomfort may be more intense.   Did you receive a nerve block?  A nerve block can provide pain relief for one hour to two days after your surgery. As long as the nerve block is working, you will experience little or no sensation in the area the surgeon operated on.  As the nerve block wears off, you will begin to experience pain or discomfort. It is very important that you begin taking your prescribed pain medication before the nerve block fully wears off. Treating your pain at the first sign of the block wearing off will ensure your pain is better controlled and more tolerable when full-sensation returns. Do not wait until the pain is intolerable, as the medicine will be less effective. It is better to treat pain in advance than to try and catch up.   General Anesthesia:  If you did not receive a nerve block during your surgery, you will need to start taking your pain medication shortly after your surgery and should continue  to do so as prescribed by your surgeon.     ICE AND ELEVATION: You may use ice for the first 48-72 hours, but it is not critical.   Motion of your fingers is very important to decrease the swelling.  Elevation, as much as possible for the next 48 hours, is critical for decreasing swelling as well as for pain relief. Elevation means when you are seated or lying down, you hand should be at or above your heart. When walking, the hand needs to be at or above the level of your elbow.  If the bandage gets too tight, it may need to be loosened. Please contact our office and we will instruct you in how to do this.    SURGICAL BANDAGES:  Keep your dressing and/or splint clean and dry at all times.  Do not remove until you are seen again in the office.  If careful, you may place a plastic bag over your bandage and tape the end to shower, but be careful, do not get your bandages wet.     HAND THERAPY:  You may not need any. If you do, we will begin this at your follow up visit in the clinic.    ACTIVITY AND WORK: You are encouraged to move any fingers which are not in the bandage.  Light use of the fingers is allowed to assist the other hand with daily hygiene and eating, but strong gripping or lifting is often uncomfortable and  should be avoided.  You might miss a variable period of time from work and hopefully this issue has been discussed prior to surgery. You may not do any heavy work with your affected hand for about 2 weeks.    [facility] Second Floor, [address] [place], [place] [address] [phone]    Post Anesthesia Home Care Instructions  Activity: Get plenty of rest for the remainder of the day. A responsible individual must stay with you for 24 hours following the procedure.  For the next 24 hours, DO NOT: -Drive a car -Advertising copywriter -Drink alcoholic beverages -Take any medication unless instructed by your physician -Make any legal decisions or sign  important papers.  Meals: Start with liquid foods such as gelatin or soup. Progress to regular foods as tolerated. Avoid greasy, spicy, heavy foods. If nausea and/or vomiting occur, drink only clear liquids until the nausea and/or vomiting subsides. Call your physician if vomiting continues.  Special Instructions/Symptoms: Your throat may feel dry or sore from the anesthesia or the breathing tube placed in your throat during surgery. If this causes discomfort, gargle with warm salt water. The discomfort should disappear within 24 hours.  Tylenol can be taken after 3:45 pm if needed

## 2022-12-18 NOTE — Transfer of Care (Signed)
Immediate Anesthesia Transfer of Care Note  Patient: Jacob Nixon.  Procedure(s) Performed: REVISION CARPAL TUNNEL RELEASE (Right: Wrist) HYPOTHENAR FAT PAD FLAP (Right: Wrist)  Patient Location: PACU  Anesthesia Type:General  Level of Consciousness: drowsy  Airway & Oxygen Therapy: Patient Spontanous Breathing and Patient connected to face mask oxygen  Post-op Assessment: Report given to RN and Post -op Vital signs reviewed and stable  Post vital signs: Reviewed and stable  Last Vitals:  Vitals Value Taken Time  BP 131/82 12/18/22 1201  Temp    Pulse 78 12/18/22 1202  Resp 15 12/18/22 1202  SpO2 100 % 12/18/22 1202  Vitals shown include unfiled device data.  Last Pain:  Vitals:   12/18/22 0931  TempSrc:   PainSc: 7       Patients Stated Pain Goal: 5 (12/18/22 0931)  Complications: No notable events documented.

## 2022-12-18 NOTE — Op Note (Signed)
Date of Surgery: 12/18/2022  INDICATIONS: Patient is a 32 y.o.-year-old male with numbness, paresthesias, and aching pain in the palm and wrist.  He has undergone two carpal tunnel releases but notes that he has no change in his symptoms following the second surgery.  He has failed extensive nonsurgical management with bracing, activity modification, oral anti-inflammatory medications, and corticosteroid injection.  Risks, benefits, and alternatives to surgery were again discussed with the patient in the preoperative area. The patient wishes to proceed with surgery.  Informed consent was signed after our discussion.   PREOPERATIVE DIAGNOSIS:  Right persistent carpal tunnel syndrome  POSTOPERATIVE DIAGNOSIS: Same.  PROCEDURE:  Right carpal tunnel release Right radial flexor tenosynovectomy at the wrist (16109)   SURGEON: Waylan Rocher, M.D.  ASSIST: None  ANESTHESIA:  general, local  IV FLUIDS AND URINE: See anesthesia.  ESTIMATED BLOOD LOSS: <5 mL.  IMPLANTS: * No implants in log *   DRAINS: None  COMPLICATIONS: None  DESCRIPTION OF PROCEDURE: The patient was met in the preoperative holding area where the surgical site was marked and the consent form was signed.  The patient was then taken to the operating room and transferred to the operating table.  All bony prominences were well padded.  A tourniquet was applied to the right upper arm.  General endotracheal anesthesia was induced.  The operative extremity was prepped and draped in the usual and sterile fashion.  A formal time-out was performed to confirm that this was the correct patient, surgery, side, and site.   Following formal timeout, the limb was gently exsanguinated with an Esmarch bandage and the tourniquet inflated to 200 mmHg.  I began by designing incision and followed his previous incision in the mid aspect of the palm.  I then extended this incision across the wrist in a [place] fashion extending in the distal  aspect of the forearm.  The skin was incised.  I began proximally with blunt dissection.  The palmaris longus tendon was identified.  I found the median nerve just deep to the palmaris longus.  I was then able to follow the median nerve distally into the wrist and palm.  The longitudinally running 58fibers of the superficial palmar fascia were divided.  The scar tissue overlying the carpal tunnel and median nerve was sharply divided under direct visualization using a tenotomy scissor.  The median nerve was decompressed to the level of the fat surrounding the palmar arch.  The median nerve did not appear to be adherent to the adjacent radial leaflet of the transverse carpal ligament.  There was, however, extensive tenosynovitis around the median nerve and along the flexor tendons.  With great care taken to protect the median nerve, a complete tenosynovectomy was performed of the each flexor tendon in the wrist and palm.  The median nerve was intact and uninjured following the tenosynovectomy.  I considered a hypothenar fat pad flap for coverage of the median nerve, however, I had some difficulty dissecting out the fat pad secondary to scarring from his previous surgeries.  In addition, the median nerve was not adherent to the adjacent radial leaflet and there were overall minimal adhesions around the nerve.  Following complete tenosynovectomy and decompression of the median nerve, the wound was thoroughly irrigated with copious sterile saline.  The tourniquet was deflated.  The stasis was achieved with bipolar cautery and direct pressure over the wound.  The incision was closed using a 4-0 nylon suture in combination of simple interrupted and horizontal mattress  fashion.  A local block was performed using 20 cc of quarter percent plain Marcaine.  The fingers were all pink and well-perfused with brisk capillary refill with the tourniquet deflated.  Wound was then dressed with Xeroform, folded Kerlix, cast padding, and  a well-padded volar splint was applied.    The patient was reversed from anesthesia and extubated uneventfully.  They were transferred from the operating table to the postoperative bed.  All counts were correct x 2 at the end of the procedure.  The patient was then taken to the PACU in stable condition.   POSTOPERATIVE PLAN: He will be discharged to home with appropriate pain medication and discharge instructions.  I will see him back in 10 to 14 days for his first postop visit.  Waylan Rocher, MD 12:07 PM

## 2022-12-18 NOTE — Anesthesia Postprocedure Evaluation (Signed)
Anesthesia Post Note  Patient: Jacob Nixon.  Procedure(s) Performed: REVISION CARPAL TUNNEL RELEASE (Right: Wrist) HYPOTHENAR FAT PAD FLAP (Right: Wrist)     Patient location during evaluation: PACU Anesthesia Type: General Level of consciousness: awake Pain management: pain level controlled Vital Signs Assessment: post-procedure vital signs reviewed and stable Respiratory status: spontaneous breathing, nonlabored ventilation and respiratory function stable Cardiovascular status: blood pressure returned to baseline and stable Postop Assessment: no apparent nausea or vomiting Anesthetic complications: no   No notable events documented.  Last Vitals:  Vitals:   12/18/22 1245 12/18/22 1305  BP: 107/89 130/87  Pulse: 81 73  Resp: 16 16  Temp:  (!) 36.2 C  SpO2: 96% 94%    Last Pain:  Vitals:   12/18/22 1305  TempSrc: Temporal  PainSc: 4                  Tyreke Kaeser P Athene Schuhmacher

## 2022-12-18 NOTE — Anesthesia Procedure Notes (Signed)
Procedure Name: LMA Insertion Date/Time: 12/18/2022 10:40 AM  Performed by: Lauralyn Primes, CRNAPre-anesthesia Checklist: Patient identified, Emergency Drugs available, Suction available and Patient being monitored Patient Re-evaluated:Patient Re-evaluated prior to induction Oxygen Delivery Method: Circle system utilized Preoxygenation: Pre-oxygenation with 100% oxygen Induction Type: IV induction Ventilation: Mask ventilation without difficulty LMA: LMA inserted LMA Size: 5.0 Number of attempts: 1 Airway Equipment and Method: Bite block Placement Confirmation: positive ETCO2 Tube secured with: Tape Dental Injury: Teeth and Oropharynx as per pre-operative assessment

## 2022-12-19 ENCOUNTER — Encounter (HOSPITAL_BASED_OUTPATIENT_CLINIC_OR_DEPARTMENT_OTHER): Payer: Self-pay | Admitting: Orthopedic Surgery

## 2023-06-16 ENCOUNTER — Encounter (HOSPITAL_BASED_OUTPATIENT_CLINIC_OR_DEPARTMENT_OTHER): Payer: Self-pay | Admitting: Emergency Medicine

## 2023-06-16 ENCOUNTER — Emergency Department (HOSPITAL_BASED_OUTPATIENT_CLINIC_OR_DEPARTMENT_OTHER)
Admission: EM | Admit: 2023-06-16 | Discharge: 2023-06-16 | Disposition: A | Discharge status: Home / Self Care | Payer: BC Managed Care – PPO | Attending: Emergency Medicine | Admitting: Emergency Medicine

## 2023-06-16 DIAGNOSIS — Z91040 Latex allergy status: Secondary | ICD-10-CM | POA: Diagnosis not present

## 2023-06-16 DIAGNOSIS — L731 Pseudofolliculitis barbae: Secondary | ICD-10-CM | POA: Insufficient documentation

## 2023-06-16 DIAGNOSIS — R519 Headache, unspecified: Secondary | ICD-10-CM | POA: Diagnosis present

## 2023-06-16 DIAGNOSIS — L738 Other specified follicular disorders: Secondary | ICD-10-CM

## 2023-06-16 MED ORDER — HYDROCODONE-ACETAMINOPHEN 5-325 MG PO TABS: 1.0000 | ORAL_TABLET | Freq: Four times a day (QID) | ORAL | 0 refills | Status: DC | PRN

## 2023-06-16 MED ORDER — IBUPROFEN 600 MG PO TABS: 600.0000 mg | ORAL_TABLET | Freq: Four times a day (QID) | ORAL | 0 refills | Status: DC | PRN

## 2023-06-16 MED ORDER — DOXYCYCLINE HYCLATE 100 MG PO CAPS: 100.0000 mg | ORAL_CAPSULE | Freq: Two times a day (BID) | ORAL | 0 refills | Status: DC

## 2023-06-16 NOTE — ED Provider Notes (Signed)
[place] EMERGENCY DEPARTMENT AT [facility] Provider Note   CSN: [id number] Arrival date & time: 06/16/23  1931     History  Chief Complaint  Patient presents with   Facial Pain    Jacob Nixon. is a 32 y.o. male.  Patient to ED with c/o progressively worsening pain and swelling involving the right side of his face. Symptoms started several days ago but he reports the pain has kept him awake for the past 2 nights having become severe. No fever. No dental issues. No drainage from the ear. No sinus or nasal congestion.   The history is provided by the patient. No language interpreter was used.       Home Medications Prior to Admission medications   Medication Sig Start Date End Date Taking? Authorizing Provider  doxycycline (VIBRAMYCIN) 100 MG capsule Take 1 capsule (100 mg total) by mouth 2 (two) times daily. 06/16/23  Yes Elpidio Anis, PA-C  HYDROcodone-acetaminophen (NORCO) 5-325 MG tablet Take 1 tablet by mouth every 6 (six) hours as needed for moderate pain (pain score 4-6). 06/16/23  Yes Faten Frieson, Melvenia Beam, PA-C  ibuprofen (ADVIL) 600 MG tablet Take 1 tablet (600 mg total) by mouth every 6 (six) hours as needed. 06/16/23  Yes Saurabh Hettich, Melvenia Beam, PA-C  clindamycin (CLEOCIN) 150 MG capsule Take 2 capsules (300 mg total) by mouth 3 (three) times daily. May dispense as 150mg  capsules 09/04/22   Allyne Gee, Rosezella [place], PA-C  fexofenadine (ALLEGRA) 180 MG tablet Take 180 mg by mouth daily as needed for allergies or rhinitis.    [provider]  levofloxacin (LEVAQUIN) 500 MG tablet Take 1 tablet (500 mg total) by mouth daily. Patient not taking: Reported on 09/04/2022 07/08/22   Linwood Dibbles, MD  naproxen sodium (ALEVE) 220 MG tablet Take 220 mg by mouth daily as needed (pain).    [provider]  ondansetron (ZOFRAN-ODT) 8 MG disintegrating tablet Take 1 tablet (8 mg total) by mouth every 8 (eight) hours as needed for nausea or vomiting. Patient not taking: Reported  on 09/04/2022 03/25/22   Theron Arista, PA-C  oxyCODONE-acetaminophen (PERCOCET/ROXICET) 5-325 MG tablet Take 1 tablet by mouth every 6 (six) hours as needed for severe pain. Patient not taking: Reported on 09/04/2022 03/25/22   Theron Arista, PA-C  tamsulosin (FLOMAX) 0.4 MG CAPS capsule Take 1 capsule (0.4 mg total) by mouth daily after supper. Patient not taking: Reported on 09/04/2022 03/25/22   Theron Arista, PA-C      Allergies    Fentanyl, Coconut flavoring agent (non-screening), Penicillins, and Latex    Review of Systems   Review of Systems  Physical Exam Updated Vital Signs BP (!) 135/105 (BP Location: Right Arm)   Pulse 98   Temp 97.6 F (36.4 C)   Resp 20   SpO2 99%  Physical Exam Vitals and nursing note reviewed.  Constitutional:      Appearance: He is well-developed.  HENT:     Head:     Comments: The patient has a full beard. There are tender, red areas in the right side/cheek that are slightly swollen. No submental or submandibular adenopathy. There is a single pustular lesion on the lateral aspect of the right brow.     Right Ear: Tympanic membrane and ear canal normal.     Nose: Nose normal. No congestion.     Mouth/Throat:     Mouth: Mucous membranes are moist.     Comments: Generally good dentition without significant decay or caries.  Pulmonary:     Effort: Pulmonary effort is normal.  Musculoskeletal:        General: Normal range of motion.     Cervical back: Normal range of motion.  Skin:    General: Skin is warm and dry.  Neurological:     Mental Status: He is alert and oriented to person, place, and time.     ED Results / Procedures / Treatments   Labs (all labs ordered are listed, but only abnormal results are displayed) Labs Reviewed - No data to display  EKG None  Radiology No results found.  Procedures Procedures    Medications Ordered in ED Medications - No data to display  ED Course/ Medical Decision Making/ A&P Clinical Course as of  06/19/23 Sanjuana Kava Jun 16, 2023  1934 No dental issues to suggest apical abscess. No ear infection. No evidence sinus infection. There are skin lesions present in the beard suggesting associated infection. Will treat with doxy and PCP follow up.  [SU]    Clinical Course User Index [SU] Elpidio Anis, PA-C                                 Medical Decision Making Risk Prescription drug management.           Final Clinical Impression(s) / ED Diagnoses Final diagnoses:  Folliculitis barbae    Rx / DC Orders ED Discharge Orders          Ordered    doxycycline (VIBRAMYCIN) 100 MG capsule  2 times daily        06/16/23 2009    HYDROcodone-acetaminophen (NORCO) 5-325 MG tablet  Every 6 hours PRN        06/16/23 2009    ibuprofen (ADVIL) 600 MG tablet  Every 6 hours PRN        06/16/23 2009              Elpidio Anis, PA-C 06/19/23 1935    Anders Simmonds T, DO 06/24/23 [phone]

## 2023-06-16 NOTE — ED Triage Notes (Signed)
C/o R sided ear and head pain and swelling. Reports causing headaches and intermittent vision changes. Denies fever

## 2023-06-16 NOTE — Discharge Instructions (Addendum)
Take the medications as prescribed. If no better in 2-3 days, return to ED for recheck. Return sooner with any worsening symptoms - fever, severe pain, new concern.

## 2023-08-12 ENCOUNTER — Other Ambulatory Visit: Payer: Self-pay

## 2023-08-12 ENCOUNTER — Emergency Department (HOSPITAL_BASED_OUTPATIENT_CLINIC_OR_DEPARTMENT_OTHER)
Admission: EM | Admit: 2023-08-12 | Discharge: 2023-08-13 | Disposition: A | Discharge status: Home / Self Care | Attending: Emergency Medicine | Admitting: Emergency Medicine

## 2023-08-12 ENCOUNTER — Emergency Department (HOSPITAL_BASED_OUTPATIENT_CLINIC_OR_DEPARTMENT_OTHER)

## 2023-08-12 ENCOUNTER — Encounter (HOSPITAL_BASED_OUTPATIENT_CLINIC_OR_DEPARTMENT_OTHER): Payer: Self-pay

## 2023-08-12 DIAGNOSIS — Z91040 Latex allergy status: Secondary | ICD-10-CM | POA: Insufficient documentation

## 2023-08-12 DIAGNOSIS — R109 Unspecified abdominal pain: Secondary | ICD-10-CM | POA: Insufficient documentation

## 2023-08-12 LAB — CBC WITH DIFFERENTIAL/PLATELET
Abs Immature Granulocytes: 0.01 10*3/uL (ref 0.00–0.07)
Basophils Absolute: 0 10*3/uL (ref 0.0–0.1)
Basophils Relative: 0 %
Eosinophils Absolute: 0.2 10*3/uL (ref 0.0–0.5)
Eosinophils Relative: 2 %
HCT: 43.9 % (ref 39.0–52.0)
Hemoglobin: 15 g/dL (ref 13.0–17.0)
Immature Granulocytes: 0 %
Lymphocytes Relative: 36 %
Lymphs Abs: 3.8 10*3/uL (ref 0.7–4.0)
MCH: 28.9 pg (ref 26.0–34.0)
MCHC: 34.2 g/dL (ref 30.0–36.0)
MCV: 84.6 fL (ref 80.0–100.0)
Monocytes Absolute: 0.8 10*3/uL (ref 0.1–1.0)
Monocytes Relative: 8 %
Neutro Abs: 5.6 10*3/uL (ref 1.7–7.7)
Neutrophils Relative %: 54 %
Platelets: 345 10*3/uL (ref 150–400)
RBC: 5.19 MIL/uL (ref 4.22–5.81)
RDW: 13.3 % (ref 11.5–15.5)
WBC: 10.5 10*3/uL (ref 4.0–10.5)
nRBC: 0 % (ref 0.0–0.2)

## 2023-08-12 LAB — BASIC METABOLIC PANEL
Anion gap: 8 (ref 5–15)
BUN: 12 mg/dL (ref 6–20)
CO2: 24 mmol/L (ref 22–32)
Calcium: 9.1 mg/dL (ref 8.9–10.3)
Chloride: 104 mmol/L (ref 98–111)
Creatinine, Ser: 0.97 mg/dL (ref 0.61–1.24)
GFR, Estimated: >60 mL/min (ref >60)
Glucose, Bld: 91 mg/dL (ref 70–99)
Potassium: 4.1 mmol/L (ref 3.5–5.1)
Sodium: 136 mmol/L (ref 135–145)

## 2023-08-12 MED ORDER — SODIUM CHLORIDE 0.9 % IV BOLUS
1000.0000 mL | Freq: Once | INTRAVENOUS | Status: AC
  Administered 2023-08-12: 1000 mL via INTRAVENOUS

## 2023-08-12 MED ORDER — MORPHINE SULFATE (PF) 4 MG/ML IV SOLN
4.0000 mg | Freq: Once | INTRAVENOUS | Status: AC
  Administered 2023-08-12: 4 mg via INTRAVENOUS
  Filled 2023-08-12: qty 1

## 2023-08-12 MED ORDER — OXYCODONE-ACETAMINOPHEN 5-325 MG PO TABS
2.0000 | ORAL_TABLET | Freq: Once | ORAL | Status: AC
  Administered 2023-08-13: 2 via ORAL
  Filled 2023-08-12: qty 2

## 2023-08-12 MED ORDER — ONDANSETRON HCL 4 MG/2ML IJ SOLN
4.0000 mg | Freq: Once | INTRAMUSCULAR | Status: AC
  Administered 2023-08-12: 4 mg via INTRAVENOUS
  Filled 2023-08-12: qty 2

## 2023-08-12 MED ORDER — KETOROLAC TROMETHAMINE 15 MG/ML IJ SOLN
15.0000 mg | Freq: Once | INTRAMUSCULAR | Status: AC
  Administered 2023-08-12: 15 mg via INTRAVENOUS
  Filled 2023-08-12: qty 1

## 2023-08-12 MED ORDER — TAMSULOSIN HCL 0.4 MG PO CAPS
0.4000 mg | ORAL_CAPSULE | Freq: Once | ORAL | Status: AC
Start: 2023-08-13 — End: 2023-08-13
  Administered 2023-08-13: 0.4 mg via ORAL
  Filled 2023-08-12: qty 1

## 2023-08-12 NOTE — ED Provider Notes (Signed)
 11:01 PM Assumed care from Dr. Adela Lank, please see their note for full history, physical and decision making until this point. In brief this is a 33 y.o. year old male who presented to the ED tonight with Flank Pain     Pending ct/meds for reeval and dispo.  Discharge instructions, including strict return precautions for new or worsening symptoms, given. Patient and/or family verbalized understanding and agreement with the plan as described.   Labs, studies and imaging reviewed by myself and considered in medical decision making if ordered. Imaging interpreted by radiology.  Labs Reviewed  CBC WITH DIFFERENTIAL/PLATELET  URINALYSIS, ROUTINE W REFLEX MICROSCOPIC  BASIC METABOLIC PANEL    CT Renal Stone Study    (Results Pending)    No follow-ups on file.

## 2023-08-12 NOTE — ED Provider Notes (Signed)
  EMERGENCY DEPARTMENT AT [facility] Provider Note   CSN: [id number] Arrival date & time: 08/12/23  2208     History  Chief Complaint  Patient presents with   Flank Pain    Jacob Nixon. is a 33 y.o. male.  33 yo M with a chief complaints of left flank pain.  Started suddenly about 5 hours ago.  Feels similar to kidney stone he said in the past.  No fevers or chills.  He nothing seems to make it better.  He has a history of multiple kidney stones.  Has required lithotripsy.   Flank Pain       Home Medications Prior to Admission medications   Medication Sig Start Date End Date Taking? Authorizing Provider  clindamycin (CLEOCIN) 150 MG capsule Take 2 capsules (300 mg total) by mouth 3 (three) times daily. May dispense as 150mg  capsules 09/04/22   Garlon Hatchet, PA-C  doxycycline (VIBRAMYCIN) 100 MG capsule Take 1 capsule (100 mg total) by mouth 2 (two) times daily. 06/16/23   Elpidio Anis, PA-C  fexofenadine (ALLEGRA) 180 MG tablet Take 180 mg by mouth daily as needed for allergies or rhinitis.    [provider]  HYDROcodone-acetaminophen (NORCO) 5-325 MG tablet Take 1 tablet by mouth every 6 (six) hours as needed for moderate pain (pain score 4-6). 06/16/23   Elpidio Anis, PA-C  ibuprofen (ADVIL) 600 MG tablet Take 1 tablet (600 mg total) by mouth every 6 (six) hours as needed. 06/16/23   Elpidio Anis, PA-C  levofloxacin (LEVAQUIN) 500 MG tablet Take 1 tablet (500 mg total) by mouth daily. Patient not taking: Reported on 09/04/2022 07/08/22   Linwood Dibbles, MD  naproxen sodium (ALEVE) 220 MG tablet Take 220 mg by mouth daily as needed (pain).    [provider]  ondansetron (ZOFRAN-ODT) 8 MG disintegrating tablet Take 1 tablet (8 mg total) by mouth every 8 (eight) hours as needed for nausea or vomiting. Patient not taking: Reported on 09/04/2022 03/25/22   Theron Arista, PA-C  oxyCODONE-acetaminophen (PERCOCET/ROXICET) 5-325 MG tablet Take 1  tablet by mouth every 6 (six) hours as needed for severe pain. Patient not taking: Reported on 09/04/2022 03/25/22   Theron Arista, PA-C  tamsulosin (FLOMAX) 0.4 MG CAPS capsule Take 1 capsule (0.4 mg total) by mouth daily after supper. Patient not taking: Reported on 09/04/2022 03/25/22   Theron Arista, PA-C      Allergies    Fentanyl, Coconut flavoring agent (non-screening), Penicillins, and Latex    Review of Systems   Review of Systems  Genitourinary:  Positive for flank pain.    Physical Exam Updated Vital Signs BP (!) 131/97 (BP Location: Right Arm)   Pulse 79   Temp 97.9 F (36.6 C) (Oral)   Resp 20   Ht 5\' 11"  (1.803 m)   Wt 117.9 kg   SpO2 97%   BMI 36.26 kg/m  Physical Exam Vitals and nursing note reviewed.  Constitutional:      Appearance: He is well-developed.  HENT:     Head: Normocephalic and atraumatic.  Eyes:     Pupils: Pupils are equal, round, and reactive to light.  Neck:     Vascular: No JVD.  Cardiovascular:     Rate and Rhythm: Normal rate and regular rhythm.     Heart sounds: No murmur heard.    No friction rub. No gallop.  Pulmonary:     Effort: No respiratory distress.     Breath sounds:  No wheezing.  Abdominal:     General: There is no distension.     Tenderness: There is no abdominal tenderness. There is no guarding or rebound.  Musculoskeletal:        General: Normal range of motion.     Cervical back: Normal range of motion and neck supple.  Skin:    Coloration: Skin is not pale.     Findings: No rash.  Neurological:     Mental Status: He is alert and oriented to person, place, and time.  Psychiatric:        Behavior: Behavior normal.     ED Results / Procedures / Treatments   Labs (all labs ordered are listed, but only abnormal results are displayed) Labs Reviewed  CBC WITH DIFFERENTIAL/PLATELET  BASIC METABOLIC PANEL  URINALYSIS, ROUTINE W REFLEX MICROSCOPIC    EKG None  Radiology No results  found.  Procedures Procedures    Medications Ordered in ED Medications  sodium chloride 0.9 % bolus 1,000 mL (1,000 mLs Intravenous New Bag/Given 08/12/23 2256)  morphine (PF) 4 MG/ML injection 4 mg (4 mg Intravenous Given 08/12/23 2256)  ondansetron (ZOFRAN) injection 4 mg (4 mg Intravenous Given 08/12/23 2256)  ketorolac (TORADOL) 15 MG/ML injection 15 mg (15 mg Intravenous Given 08/12/23 2256)    ED Course/ Medical Decision Making/ A&P                                 Medical Decision Making Amount and/or Complexity of Data Reviewed Labs: ordered. Radiology: ordered.  Risk Prescription drug management.   33 yo M with a chief complaint of left flank pain.  Feels like when he had kidney stone in the past.  Has required intervention previously.  Will treat pain and nausea.  IV fluids.  CT stone study.  Reassess.  No leukocytosis.  No anemia.   Signed out to Dr. Clayborne Dana, please see their note for further details of care in the ED.   The patients results and plan were reviewed and discussed.   Any x-rays performed were independently reviewed by myself.   Differential diagnosis were considered with the presenting HPI.  Medications  sodium chloride 0.9 % bolus 1,000 mL (1,000 mLs Intravenous New Bag/Given 08/12/23 2256)  morphine (PF) 4 MG/ML injection 4 mg (4 mg Intravenous Given 08/12/23 2256)  ondansetron (ZOFRAN) injection 4 mg (4 mg Intravenous Given 08/12/23 2256)  ketorolac (TORADOL) 15 MG/ML injection 15 mg (15 mg Intravenous Given 08/12/23 2256)    Vitals:   08/12/23 2213  BP: (!) 131/97  Pulse: 79  Resp: 20  Temp: 97.9 F (36.6 C)  TempSrc: Oral  SpO2: 97%  Weight: 117.9 kg  Height: 5\' 11"  (1.803 m)    Final diagnoses:  Left flank pain           Final Clinical Impression(s) / ED Diagnoses Final diagnoses:  Left flank pain    Rx / DC Orders ED Discharge Orders     None         Melene Plan, DO 08/12/23 2313

## 2023-08-12 NOTE — ED Triage Notes (Signed)
 Pt presents via POV c/o left sided flank pain x3 hrs. Reports hx of kidney stones.

## 2023-08-13 LAB — URINALYSIS, ROUTINE W REFLEX MICROSCOPIC
Bilirubin Urine: NEGATIVE
Glucose, UA: NEGATIVE mg/dL
Hgb urine dipstick: NEGATIVE
Ketones, ur: NEGATIVE mg/dL
Leukocytes,Ua: NEGATIVE
Nitrite: NEGATIVE
Protein, ur: NEGATIVE mg/dL
Specific Gravity, Urine: 1.019 (ref 1.005–1.030)
pH: 6.5 (ref 5.0–8.0)

## 2023-08-13 MED ORDER — HYDROMORPHONE HCL 1 MG/ML IJ SOLN
1.0000 mg | Freq: Once | INTRAMUSCULAR | Status: AC
  Administered 2023-08-13: 1 mg via INTRAVENOUS
  Filled 2023-08-13: qty 1

## 2023-08-13 MED ORDER — ONDANSETRON 8 MG PO TBDP: 8.0000 mg | ORAL_TABLET | Freq: Three times a day (TID) | ORAL | 0 refills | Status: DC | PRN

## 2023-08-13 MED ORDER — TAMSULOSIN HCL 0.4 MG PO CAPS: 0.4000 mg | ORAL_CAPSULE | Freq: Every day | ORAL | 0 refills | Status: DC

## 2023-08-13 MED ORDER — OXYCODONE-ACETAMINOPHEN 5-325 MG PO TABS: 1.0000 | ORAL_TABLET | Freq: Three times a day (TID) | ORAL | 0 refills | Status: DC | PRN

## 2023-08-13 MED ORDER — LACTATED RINGERS IV BOLUS
1000.0000 mL | Freq: Once | INTRAVENOUS | Status: AC
  Administered 2023-08-13: 1000 mL via INTRAVENOUS

## 2023-08-13 MED ORDER — KETOROLAC TROMETHAMINE 30 MG/ML IJ SOLN
15.0000 mg | Freq: Once | INTRAMUSCULAR | Status: AC
  Administered 2023-08-13: 15 mg via INTRAVENOUS
  Filled 2023-08-13: qty 1

## 2023-08-13 MED ORDER — IBUPROFEN 800 MG PO TABS: 800.0000 mg | ORAL_TABLET | Freq: Four times a day (QID) | ORAL | 0 refills | Status: DC | PRN

## 2023-10-12 ENCOUNTER — Emergency Department (HOSPITAL_BASED_OUTPATIENT_CLINIC_OR_DEPARTMENT_OTHER)
Admission: EM | Admit: 2023-10-12 | Discharge: 2023-10-12 | Disposition: A | Discharge status: Home / Self Care | Attending: Emergency Medicine | Admitting: Emergency Medicine

## 2023-10-12 ENCOUNTER — Encounter (HOSPITAL_BASED_OUTPATIENT_CLINIC_OR_DEPARTMENT_OTHER): Payer: Self-pay | Admitting: Emergency Medicine

## 2023-10-12 ENCOUNTER — Other Ambulatory Visit: Payer: Self-pay

## 2023-10-12 DIAGNOSIS — T63301A Toxic effect of unspecified spider venom, accidental (unintentional), initial encounter: Secondary | ICD-10-CM

## 2023-10-12 DIAGNOSIS — Z23 Encounter for immunization: Secondary | ICD-10-CM | POA: Diagnosis not present

## 2023-10-12 DIAGNOSIS — S60561A Insect bite (nonvenomous) of right hand, initial encounter: Secondary | ICD-10-CM | POA: Insufficient documentation

## 2023-10-12 DIAGNOSIS — W57XXXA Bitten or stung by nonvenomous insect and other nonvenomous arthropods, initial encounter: Secondary | ICD-10-CM | POA: Insufficient documentation

## 2023-10-12 DIAGNOSIS — Z91040 Latex allergy status: Secondary | ICD-10-CM | POA: Diagnosis not present

## 2023-10-12 DIAGNOSIS — Y93F9 Activity, other caregiving: Secondary | ICD-10-CM | POA: Diagnosis not present

## 2023-10-12 MED ORDER — TETANUS-DIPHTH-ACELL PERTUSSIS 5-2.5-18.5 LF-MCG/0.5 IM SUSY
0.5000 mL | PREFILLED_SYRINGE | Freq: Once | INTRAMUSCULAR | Status: AC
  Administered 2023-10-12: 0.5 mL via INTRAMUSCULAR
  Filled 2023-10-12: qty 0.5

## 2023-10-12 MED ORDER — KETOROLAC TROMETHAMINE 15 MG/ML IJ SOLN
15.0000 mg | Freq: Once | INTRAMUSCULAR | Status: AC
  Administered 2023-10-12: 15 mg via INTRAMUSCULAR
  Filled 2023-10-12: qty 1

## 2023-10-12 NOTE — ED Notes (Signed)
 Reviewed AVS/discharge instruction with patient. Time allotted for and all questions answered. Patient is agreeable for d/c and escorted to ed exit by staff.

## 2023-10-12 NOTE — Discharge Instructions (Signed)
 Today you are seen for a possible spider bite to the right hand.  You may take pain medications such as Tylenol  and Motrin  as needed for pain and swelling.  Please keep the area clean.  Please return to the ED if you have uncontrollable vomiting, fever, or puslike discharge wound.  Thank you for letting us  treat you today. After performing a physical exam, I feel you are safe to go home. Please follow up with your PCP in the next several days and provide them with your records from this visit. Return to the Emergency Room if pain becomes severe or symptoms worsen.

## 2023-10-12 NOTE — ED Provider Notes (Signed)
 [place] EMERGENCY DEPARTMENT AT [facility] Provider Note   CSN: [id number] Arrival date & time: 10/12/23  1723     History  Chief Complaint  Patient presents with   Insect Bite    Jacob Nixon. is a 33 y.o. male presents today for redness on his right hand that began approximately 30 to 45 minutes ago.  Patient states he was carrying a box and noticed a black spider and then noticed pain and swelling in his right hand.  Patient denies numbness, tingling, nausea, vomiting, fever, or chills.  HPI     Home Medications Prior to Admission medications   Medication Sig Start Date End Date Taking? Authorizing Provider  fexofenadine (ALLEGRA) 180 MG tablet Take 180 mg by mouth daily as needed for allergies or rhinitis.    [provider]  ibuprofen  (ADVIL ) 800 MG tablet Take 1 tablet (800 mg total) by mouth every 6 (six) hours as needed for moderate pain (pain score 4-6). 08/13/23   Mesner, Reymundo Caulk, MD  ondansetron  (ZOFRAN -ODT) 8 MG disintegrating tablet Take 1 tablet (8 mg total) by mouth every 8 (eight) hours as needed for nausea or vomiting. 08/13/23   Mesner, Reymundo Caulk, MD  oxyCODONE -acetaminophen  (PERCOCET ) 5-325 MG tablet Take 1-2 tablets by mouth every 8 (eight) hours as needed for severe pain (pain score 7-10). 08/13/23   Mesner, Reymundo Caulk, MD  tamsulosin  (FLOMAX ) 0.4 MG CAPS capsule Take 1 capsule (0.4 mg total) by mouth daily. Until stone passes 08/13/23   Mesner, Reymundo Caulk, MD      Allergies    Fentanyl , Coconut flavoring agent (non-screening), Penicillins, and Latex    Review of Systems   Review of Systems  Skin:  Positive for wound.    Physical Exam Updated Vital Signs BP (!) 124/90 (BP Location: Left Arm)   Pulse (!) 114   Temp 98.2 F (36.8 C)   Resp 19   SpO2 100%  Physical Exam Vitals and nursing note reviewed.  Constitutional:      General: He is not in acute distress.    Appearance: He is well-developed.  HENT:     Head: Normocephalic and  atraumatic.     Right Ear: External ear normal.     Left Ear: External ear normal.     Nose: Nose normal.     Mouth/Throat:     Mouth: Mucous membranes are dry.  Eyes:     Conjunctiva/sclera: Conjunctivae normal.  Cardiovascular:     Rate and Rhythm: Normal rate and regular rhythm.     Pulses: Normal pulses.     Heart sounds: No murmur heard. Pulmonary:     Effort: Pulmonary effort is normal. No respiratory distress.     Breath sounds: Normal breath sounds.  Abdominal:     Palpations: Abdomen is soft.     Tenderness: There is no abdominal tenderness.  Musculoskeletal:        General: No swelling.     Cervical back: Neck supple.  Skin:    General: Skin is warm and dry.     Capillary Refill: Capillary refill takes less than 2 seconds.     Findings: Erythema present.     Comments: Mild erythema noted to patient's right middle distal metacarpal with minimal swelling and no warmth noted on exam.  Patient is neurovascularly intact.  +2 radial pulses.  Patient does note tenderness to palpation of  Neurological:     Mental Status: He is alert.  Psychiatric:  Mood and Affect: Mood is anxious.     ED Results / Procedures / Treatments   Labs (all labs ordered are listed, but only abnormal results are displayed) Labs Reviewed - No data to display  EKG None  Radiology No results found.  Procedures Procedures    Medications Ordered in ED Medications  Tdap (BOOSTRIX ) injection 0.5 mL (has no administration in time range)    ED Course/ Medical Decision Making/ A&P                                 Medical Decision Making  This patient presents to the ED for concern of possible spider bite differential diagnosis includes spider bite, cellulitis, abscess   Problem List / ED Course:  Wound irrigated and Tdap updated Consider for mission further workup however patient's vital signs and physical exam are reassuring.  Patient advised to keep area clean and take Tylenol   Motrin  as needed for pain.  Patient given return precautions.  I feel patient is safe for discharge at this time.        Final Clinical Impression(s) / ED Diagnoses Final diagnoses:  Spider bite wound, accidental or unintentional, initial encounter    Rx / DC Orders ED Discharge Orders     None         Carie Charity, PA-C 10/12/23 1817    Teddi Favors, DO 10/21/23 1603

## 2023-10-12 NOTE — ED Triage Notes (Signed)
 Pt caox4, ambulatory, NAD c/o pain and redness in the R hand stating approx 30-45 min ago he was carrying a box and noticed a black spider and then noticed pain and swelling in his R hand. Pt also reports PMH cellulitis.

## 2023-12-05 ENCOUNTER — Encounter: Payer: Self-pay | Admitting: Advanced Practice Midwife

## 2023-12-16 ENCOUNTER — Emergency Department (HOSPITAL_BASED_OUTPATIENT_CLINIC_OR_DEPARTMENT_OTHER)
Admission: EM | Admit: 2023-12-16 | Discharge: 2023-12-16 | Disposition: A | Discharge status: Home / Self Care | Attending: Emergency Medicine | Admitting: Emergency Medicine

## 2023-12-16 ENCOUNTER — Other Ambulatory Visit: Payer: Self-pay

## 2023-12-16 ENCOUNTER — Emergency Department (HOSPITAL_BASED_OUTPATIENT_CLINIC_OR_DEPARTMENT_OTHER)

## 2023-12-16 ENCOUNTER — Encounter (HOSPITAL_BASED_OUTPATIENT_CLINIC_OR_DEPARTMENT_OTHER): Payer: Self-pay | Admitting: Emergency Medicine

## 2023-12-16 DIAGNOSIS — Z91040 Latex allergy status: Secondary | ICD-10-CM | POA: Insufficient documentation

## 2023-12-16 DIAGNOSIS — R109 Unspecified abdominal pain: Secondary | ICD-10-CM | POA: Insufficient documentation

## 2023-12-16 DIAGNOSIS — Z87442 Personal history of urinary calculi: Secondary | ICD-10-CM | POA: Insufficient documentation

## 2023-12-16 LAB — HEPATIC FUNCTION PANEL
ALT: 22 U/L (ref 0–44)
AST: 19 U/L (ref 15–41)
Albumin: 3.9 g/dL (ref 3.5–5.0)
Alkaline Phosphatase: 102 U/L (ref 38–126)
Bilirubin, Direct: 0.1 mg/dL (ref 0.0–0.2)
Indirect Bilirubin: 0.2 mg/dL — ABNORMAL LOW (ref 0.3–0.9)
Total Bilirubin: 0.4 mg/dL (ref 0.0–1.2)
Total Protein: 7.5 g/dL (ref 6.5–8.1)

## 2023-12-16 LAB — URINALYSIS, ROUTINE W REFLEX MICROSCOPIC
Bacteria, UA: NONE SEEN
Bilirubin Urine: NEGATIVE
Glucose, UA: NEGATIVE mg/dL
Ketones, ur: NEGATIVE mg/dL
Leukocytes,Ua: NEGATIVE
Nitrite: NEGATIVE
Protein, ur: NEGATIVE mg/dL
Specific Gravity, Urine: 1.019 (ref 1.005–1.030)
pH: 6.5 (ref 5.0–8.0)

## 2023-12-16 LAB — CBC
HCT: 43.5 % (ref 39.0–52.0)
Hemoglobin: 15 g/dL (ref 13.0–17.0)
MCH: 29.1 pg (ref 26.0–34.0)
MCHC: 34.5 g/dL (ref 30.0–36.0)
MCV: 84.3 fL (ref 80.0–100.0)
Platelets: 317 K/uL (ref 150–400)
RBC: 5.16 MIL/uL (ref 4.22–5.81)
RDW: 13.2 % (ref 11.5–15.5)
WBC: 10.3 K/uL (ref 4.0–10.5)
nRBC: 0 % (ref 0.0–0.2)

## 2023-12-16 LAB — BASIC METABOLIC PANEL WITH GFR
Anion gap: 11 (ref 5–15)
BUN: 6 mg/dL (ref 6–20)
CO2: 23 mmol/L (ref 22–32)
Calcium: 9.1 mg/dL (ref 8.9–10.3)
Chloride: 106 mmol/L (ref 98–111)
Creatinine, Ser: 0.91 mg/dL (ref 0.61–1.24)
GFR, Estimated: >60 mL/min (ref >60)
Glucose, Bld: 128 mg/dL — ABNORMAL HIGH (ref 70–99)
Potassium: 3.8 mmol/L (ref 3.5–5.1)
Sodium: 140 mmol/L (ref 135–145)

## 2023-12-16 MED ORDER — KETOROLAC TROMETHAMINE 15 MG/ML IJ SOLN
15.0000 mg | Freq: Once | INTRAMUSCULAR | Status: AC
  Administered 2023-12-16: 15 mg via INTRAVENOUS
  Filled 2023-12-16: qty 1

## 2023-12-16 MED ORDER — ONDANSETRON HCL 4 MG/2ML IJ SOLN
4.0000 mg | Freq: Once | INTRAMUSCULAR | Status: AC | PRN
Start: 2023-12-16 — End: 2023-12-16
  Administered 2023-12-16: 4 mg via INTRAVENOUS
  Filled 2023-12-16: qty 2

## 2023-12-16 MED ORDER — MORPHINE SULFATE (PF) 4 MG/ML IV SOLN
4.0000 mg | Freq: Once | INTRAVENOUS | Status: AC
  Administered 2023-12-16: 4 mg via INTRAVENOUS
  Filled 2023-12-16: qty 1

## 2023-12-16 MED ORDER — ONDANSETRON 4 MG PO TBDP: 4.0000 mg | ORAL_TABLET | Freq: Four times a day (QID) | ORAL | 0 refills | Status: DC | PRN

## 2023-12-16 MED ORDER — OXYCODONE-ACETAMINOPHEN 5-325 MG PO TABS
1.0000 | ORAL_TABLET | Freq: Once | ORAL | Status: AC
  Administered 2023-12-16: 1 via ORAL
  Filled 2023-12-16: qty 1

## 2023-12-16 MED ORDER — KETOROLAC TROMETHAMINE 10 MG PO TABS: 10.0000 mg | ORAL_TABLET | Freq: Four times a day (QID) | ORAL | 0 refills | Status: AC | PRN

## 2023-12-16 MED ORDER — SODIUM CHLORIDE 0.9 % IV BOLUS
1000.0000 mL | Freq: Once | INTRAVENOUS | Status: AC
  Administered 2023-12-16: 1000 mL via INTRAVENOUS

## 2023-12-16 NOTE — ED Notes (Signed)
 ED Provider at bedside.

## 2023-12-16 NOTE — Discharge Instructions (Signed)
 Please use Tylenol  or ibuprofen  for pain.  You may use 600 mg ibuprofen  every 6 hours or 1000 mg of Tylenol  every 6 hours.  You may choose to alternate between the 2.  This would be most effective.  Not to exceed 4 g of Tylenol  within 24 hours.  Not to exceed 3200 mg ibuprofen  24 hours.  You can take the other pain medicine instead of ibuprofen  that I prescribed.  You can use the nausea medication up to every 6 hours as needed.  If you have complete resolution of your pain then you likely had a kidney stone that passed on its own.  If you have continued or worsening pain you may want to return for further evaluation or follow-up with your urologist.

## 2023-12-16 NOTE — ED Notes (Signed)
 Dc instructions given, pt verbalized understanding. Out of ED with steady gait not in visible distress.

## 2023-12-16 NOTE — ED Triage Notes (Signed)
 Right flank pain  It feels like a kidney stone Started yesterday Dark urine reported

## 2023-12-16 NOTE — ED Notes (Signed)
 Patient transported to CT

## 2023-12-16 NOTE — ED Provider Notes (Signed)
 [place] EMERGENCY DEPARTMENT AT [facility] Provider Note   CSN: [id number] Arrival date & time: 12/16/23  1840     Patient presents with: Flank Pain   Jacob Nixon. is a 33 y.o. male with past medical history significant for previous kidney stones, cervical radiculopathy, arthritis who presents with concern for right sided flank pain starting yesterday, some dark urine reported.  He reports that he has had decreased p.o. intake secondary to the pain, intermittent nausea.  He rates the pain 7/10.  He denies any fever, chills.  He reports mild burning with urination.    Flank Pain       Prior to Admission medications   Medication Sig Start Date End Date Taking? Authorizing Provider  ketorolac  (TORADOL ) 10 MG tablet Take 1 tablet (10 mg total) by mouth every 6 (six) hours as needed. 12/16/23  Yes Xylia Scherger H, PA-C  ondansetron  (ZOFRAN -ODT) 4 MG disintegrating tablet Take 1 tablet (4 mg total) by mouth every 6 (six) hours as needed for nausea or vomiting. 12/16/23  Yes Adit Riddles H, PA-C  fexofenadine (ALLEGRA) 180 MG tablet Take 180 mg by mouth daily as needed for allergies or rhinitis.    [provider]  ibuprofen  (ADVIL ) 800 MG tablet Take 1 tablet (800 mg total) by mouth every 6 (six) hours as needed for moderate pain (pain score 4-6). 08/13/23   Mesner, Selinda, MD  oxyCODONE -acetaminophen  (PERCOCET ) 5-325 MG tablet Take 1-2 tablets by mouth every 8 (eight) hours as needed for severe pain (pain score 7-10). 08/13/23   Mesner, Selinda, MD  tamsulosin  (FLOMAX ) 0.4 MG CAPS capsule Take 1 capsule (0.4 mg total) by mouth daily. Until stone passes 08/13/23   Mesner, Selinda, MD    Allergies: Fentanyl , Coconut flavoring agent (non-screening), Penicillins, and Latex    Review of Systems  Genitourinary:  Positive for flank pain.  All other systems reviewed and are negative.   Updated Vital Signs BP 138/88 (BP Location: Right Arm)   Pulse 92   Temp  97.9 F (36.6 C)   Resp 17   SpO2 97%   Physical Exam Vitals and nursing note reviewed.  Constitutional:      General: He is not in acute distress.    Appearance: Normal appearance.  HENT:     Head: Normocephalic and atraumatic.  Eyes:     General:        Right eye: No discharge.        Left eye: No discharge.  Cardiovascular:     Rate and Rhythm: Normal rate and regular rhythm.     Heart sounds: No murmur heard.    No friction rub. No gallop.  Pulmonary:     Effort: Pulmonary effort is normal.     Breath sounds: Normal breath sounds.  Abdominal:     General: Bowel sounds are normal.     Palpations: Abdomen is soft.     Comments: Some focal tenderness to right flank/right thoracic paraspinous muscles.  No anterior abdominal tenderness, no CVA tenderness.  Musculoskeletal:     Comments: Intact strength 5/5 of bilateral upper and lower extremities.  Skin:    General: Skin is warm and dry.     Capillary Refill: Capillary refill takes less than 2 seconds.  Neurological:     Mental Status: He is alert and oriented to person, place, and time.  Psychiatric:        Mood and Affect: Mood normal.  Behavior: Behavior normal.     (all labs ordered are listed, but only abnormal results are displayed) Labs Reviewed  URINALYSIS, ROUTINE W REFLEX MICROSCOPIC - Abnormal; Notable for the following components:      Result Value   APPearance HAZY (*)    Hgb urine dipstick SMALL (*)    All other components within normal limits  BASIC METABOLIC PANEL WITH GFR - Abnormal; Notable for the following components:   Glucose, Bld 128 (*)    All other components within normal limits  HEPATIC FUNCTION PANEL - Abnormal; Notable for the following components:   Indirect Bilirubin 0.2 (*)    All other components within normal limits  CBC    EKG: None  Radiology: CT Renal Stone Study Result Date: 12/16/2023 CLINICAL DATA:  Right flank pain EXAM: CT ABDOMEN AND PELVIS WITHOUT CONTRAST  TECHNIQUE: Multidetector CT imaging of the abdomen and pelvis was performed following the standard protocol without IV contrast. RADIATION DOSE REDUCTION: This exam was performed according to the departmental dose-optimization program which includes automated exposure control, adjustment of the mA and/or kV according to patient size and/or use of iterative reconstruction technique. COMPARISON:  08/12/2023 FINDINGS: Lower chest: No acute abnormality Hepatobiliary: No focal hepatic abnormality. Gallbladder unremarkable. Pancreas: No focal abnormality or ductal dilatation. Spleen: No focal abnormality.  Normal size. Adrenals/Urinary Tract: 2 mm nonobstructing stone in the lower pole of the left kidney. No ureteral stones or hydronephrosis. No renal or adrenal abnormality. Urinary bladder unremarkable. Stomach/Bowel: Normal appendix. Stomach, large and small bowel grossly unremarkable. Vascular/Lymphatic: No evidence of aneurysm or adenopathy. Reproductive: No visible focal abnormality. Other: No free fluid or free air. Musculoskeletal: No acute bony abnormality. IMPRESSION: Left lower pole nephrolithiasis. No ureteral stones or hydronephrosis. No acute findings in the abdomen or pelvis. Electronically Signed   By: Franky Crease M.D.   On: 12/16/2023 20:33     Procedures   Medications Ordered in the ED  oxyCODONE -acetaminophen  (PERCOCET /ROXICET) 5-325 MG per tablet 1 tablet (1 tablet Oral Given 12/16/23 1917)  ketorolac  (TORADOL ) 15 MG/ML injection 15 mg (15 mg Intravenous Given 12/16/23 2034)  morphine  (PF) 4 MG/ML injection 4 mg (4 mg Intravenous Given 12/16/23 2103)  sodium chloride  0.9 % bolus 1,000 mL (1,000 mLs Intravenous New Bag/Given 12/16/23 2032)  ondansetron  (ZOFRAN ) injection 4 mg (4 mg Intravenous Given 12/16/23 2035)                                    Medical Decision Making Amount and/or Complexity of Data Reviewed Labs: ordered.  Risk Prescription drug management.   This patient is a  33 y.o. male  who presents to the ED for concern of flank pain.   Differential diagnoses prior to evaluation: The emergent differential diagnosis includes, but is not limited to,  AAA, renal vascular thrombosis, mesenteric ischemia, pyelonephritis, nephrolithiasis, cystitis, biliary colic, pancreatitis, PUD, appendicitis, diverticulitis, bowel obstruction . This is not an exhaustive differential.   Past Medical History / Co-morbidities / Social History: previous kidney stones, cervical radiculopathy, arthritis  Additional history: Chart reviewed. Pertinent results include: Reviewed lab work, imaging from previous emergency department visits, notably with kidney stone diagnosed in March of this year  Physical Exam: Physical exam performed. The pertinent findings include: Comments: Some focal tenderness to right flank/right thoracic paraspinous muscles.  No anterior abdominal tenderness, no CVA tenderness.  Musculoskeletal:     Comments: Intact strength 5/5 of bilateral  upper and lower extremities.  Lab Tests/Imaging studies: I personally interpreted labs/imaging and the pertinent results include: UA shows small hemoglobin, otherwise overall unremarkable, CBC unremarkable, metabolic and hepatic function panel shows no acute abnormality, I independently interpreted CT renal stone study which shows no evidence of acute intra-abdominal abnormality to explain patient's symptoms. I agree with the radiologist interpretation.   Medications: I ordered medication including morphine , Zofran , Toradol , fluid bolus for pain, on reassessment patient feeling significantly improved.  I have reviewed the patients home medicines and have made adjustments as needed.   Disposition: After consideration of the diagnostic results and the patients response to treatment, I feel that possibly musculoskeletal pain versus recently passed kidney stone, encourage close follow-up with urology, otherwise discharged in stable  condition today with toradol , zofran  as needed.   emergency department workup does not suggest an emergent condition requiring admission or immediate intervention beyond what has been performed at this time. The plan is: as above. The patient is safe for discharge and has been instructed to return immediately for worsening symptoms, change in symptoms or any other concerns.   Final diagnoses:  Flank pain    ED Discharge Orders          Ordered    ketorolac  (TORADOL ) 10 MG tablet  Every 6 hours PRN        12/16/23 2123    ondansetron  (ZOFRAN -ODT) 4 MG disintegrating tablet  Every 6 hours PRN        12/16/23 2123               Rosan Sherlean VEAR DEVONNA 12/16/23 2124    Cottie Donnice PARAS, MD 12/16/23 2248

## 2024-01-28 ENCOUNTER — Emergency Department (HOSPITAL_BASED_OUTPATIENT_CLINIC_OR_DEPARTMENT_OTHER)
Admission: EM | Admit: 2024-01-28 | Discharge: 2024-01-29 | Disposition: A | Discharge status: Home / Self Care | Attending: Emergency Medicine | Admitting: Emergency Medicine

## 2024-01-28 ENCOUNTER — Emergency Department (HOSPITAL_BASED_OUTPATIENT_CLINIC_OR_DEPARTMENT_OTHER)

## 2024-01-28 ENCOUNTER — Encounter (HOSPITAL_BASED_OUTPATIENT_CLINIC_OR_DEPARTMENT_OTHER): Payer: Self-pay

## 2024-01-28 ENCOUNTER — Other Ambulatory Visit: Payer: Self-pay

## 2024-01-28 DIAGNOSIS — Z91040 Latex allergy status: Secondary | ICD-10-CM | POA: Diagnosis not present

## 2024-01-28 DIAGNOSIS — N200 Calculus of kidney: Secondary | ICD-10-CM | POA: Diagnosis not present

## 2024-01-28 DIAGNOSIS — R109 Unspecified abdominal pain: Secondary | ICD-10-CM | POA: Diagnosis present

## 2024-01-28 LAB — CBC WITH DIFFERENTIAL/PLATELET
Abs Immature Granulocytes: 0.03 K/uL (ref 0.00–0.07)
Basophils Absolute: 0 K/uL (ref 0.0–0.1)
Basophils Relative: 0 %
Eosinophils Absolute: 0.2 K/uL (ref 0.0–0.5)
Eosinophils Relative: 2 %
HCT: 46.5 % (ref 39.0–52.0)
Hemoglobin: 15.6 g/dL (ref 13.0–17.0)
Immature Granulocytes: 0 %
Lymphocytes Relative: 33 %
Lymphs Abs: 3.9 K/uL (ref 0.7–4.0)
MCH: 28.7 pg (ref 26.0–34.0)
MCHC: 33.5 g/dL (ref 30.0–36.0)
MCV: 85.5 fL (ref 80.0–100.0)
Monocytes Absolute: 0.9 K/uL (ref 0.1–1.0)
Monocytes Relative: 7 %
Neutro Abs: 6.8 K/uL (ref 1.7–7.7)
Neutrophils Relative %: 58 %
Platelets: 351 K/uL (ref 150–400)
RBC: 5.44 MIL/uL (ref 4.22–5.81)
RDW: 13.6 % (ref 11.5–15.5)
WBC: 11.8 K/uL — ABNORMAL HIGH (ref 4.0–10.5)
nRBC: 0 % (ref 0.0–0.2)

## 2024-01-28 LAB — BASIC METABOLIC PANEL WITH GFR
Anion gap: 12 (ref 5–15)
BUN: 8 mg/dL (ref 6–20)
CO2: 24 mmol/L (ref 22–32)
Calcium: 9.9 mg/dL (ref 8.9–10.3)
Chloride: 102 mmol/L (ref 98–111)
Creatinine, Ser: 1.03 mg/dL (ref 0.61–1.24)
GFR, Estimated: >60 mL/min (ref >60)
Glucose, Bld: 100 mg/dL — ABNORMAL HIGH (ref 70–99)
Potassium: 4.3 mmol/L (ref 3.5–5.1)
Sodium: 138 mmol/L (ref 135–145)

## 2024-01-28 LAB — URINALYSIS, ROUTINE W REFLEX MICROSCOPIC
Bacteria, UA: NONE SEEN
Bilirubin Urine: NEGATIVE
Glucose, UA: NEGATIVE mg/dL
Ketones, ur: NEGATIVE mg/dL
Nitrite: NEGATIVE
Protein, ur: NEGATIVE mg/dL
RBC / HPF: >50 RBC/hpf (ref 0–5)
Specific Gravity, Urine: 1.016 (ref 1.005–1.030)
pH: 6.5 (ref 5.0–8.0)

## 2024-01-28 MED ORDER — OXYCODONE-ACETAMINOPHEN 5-325 MG PO TABS
1.0000 | ORAL_TABLET | Freq: Once | ORAL | Status: AC
  Administered 2024-01-28: 1 via ORAL
  Filled 2024-01-28: qty 1

## 2024-01-28 MED ORDER — MORPHINE SULFATE (PF) 4 MG/ML IV SOLN
4.0000 mg | Freq: Once | INTRAVENOUS | Status: AC
  Administered 2024-01-28: 4 mg via INTRAMUSCULAR
  Filled 2024-01-28: qty 1

## 2024-01-28 MED ORDER — KETOROLAC TROMETHAMINE 15 MG/ML IJ SOLN
15.0000 mg | Freq: Once | INTRAMUSCULAR | Status: AC
  Administered 2024-01-28: 15 mg via INTRAMUSCULAR
  Filled 2024-01-28: qty 1

## 2024-01-28 MED ORDER — ONDANSETRON HCL 4 MG PO TABS: 4.0000 mg | ORAL_TABLET | Freq: Three times a day (TID) | ORAL | Status: AC | PRN

## 2024-01-28 MED ORDER — OXYCODONE-ACETAMINOPHEN 5-325 MG PO TABS: 1.0000 | ORAL_TABLET | Freq: Four times a day (QID) | ORAL | 0 refills | Status: DC | PRN

## 2024-01-28 MED ORDER — MORPHINE SULFATE (PF) 4 MG/ML IV SOLN: 4.0000 mg | Freq: Once | INTRAVENOUS | Status: DC

## 2024-01-28 MED ORDER — ONDANSETRON 4 MG PO TBDP
4.0000 mg | ORAL_TABLET | Freq: Once | ORAL | Status: AC
  Administered 2024-01-28: 4 mg via ORAL
  Filled 2024-01-28: qty 1

## 2024-01-28 NOTE — ED Triage Notes (Signed)
 Sudden left sided flank pain that manifested today. History of renal colic and was told there were some on the left side.

## 2024-01-28 NOTE — ED Provider Notes (Signed)
 [place] EMERGENCY DEPARTMENT AT [facility] Provider Note   CSN: [id number] Arrival date & time: 01/28/24  8091     Patient presents with: Flank Pain   Jacob Nixon. is a 33 y.o. male.    Flank Pain  Patient is a 33 year old male was in the ED today for concerns for bilateral flank pain, worse on left than right that has been started acutely today and accompanied with hematuria.  Also noting accompanying nausea and headache.  States that he had been told that he had a kidney stone on the left side and believes that this is what is the cause the pain today.  History of kidney stones requiring lithotripsy, cellulitis, abscesses.  Denies fever, vision changes, dysphagia, cough, congestion, chest pain, shortness of breath, vomiting, testicular pain, diarrhea, lower leg swelling.    Prior to Admission medications   Medication Sig Start Date End Date Taking? Authorizing Provider  ondansetron  (ZOFRAN ) 4 MG tablet Take 1 tablet (4 mg total) by mouth every 8 (eight) hours as needed for nausea or vomiting. 01/28/24  Yes Khamya Topp S, PA-C  oxyCODONE -acetaminophen  (PERCOCET /ROXICET) 5-325 MG tablet Take 1 tablet by mouth every 6 (six) hours as needed for severe pain (pain score 7-10). 01/28/24  Yes Beola Terrall RAMAN, PA-C  fexofenadine (ALLEGRA) 180 MG tablet Take 180 mg by mouth daily as needed for allergies or rhinitis.    [provider]  ibuprofen  (ADVIL ) 800 MG tablet Take 1 tablet (800 mg total) by mouth every 6 (six) hours as needed for moderate pain (pain score 4-6). 08/13/23   Mesner, Selinda, MD  ketorolac  (TORADOL ) 10 MG tablet Take 1 tablet (10 mg total) by mouth every 6 (six) hours as needed. 12/16/23   Prosperi, Christian H, PA-C  ondansetron  (ZOFRAN -ODT) 4 MG disintegrating tablet Take 1 tablet (4 mg total) by mouth every 6 (six) hours as needed for nausea or vomiting. 12/16/23   Prosperi, Christian H, PA-C  oxyCODONE -acetaminophen  (PERCOCET ) 5-325 MG tablet  Take 1-2 tablets by mouth every 8 (eight) hours as needed for severe pain (pain score 7-10). 08/13/23   Mesner, Selinda, MD  tamsulosin  (FLOMAX ) 0.4 MG CAPS capsule Take 1 capsule (0.4 mg total) by mouth daily. Until stone passes 01/29/24   Dariyon Urquilla S, PA-C    Allergies: Fentanyl , Coconut flavoring agent (non-screening), Penicillins, and Latex    Review of Systems  Gastrointestinal:  Positive for nausea.  Genitourinary:  Positive for flank pain.  All other systems reviewed and are negative.   Updated Vital Signs BP 104/63   Pulse 78   Temp 98.2 F (36.8 C) (Oral)   Resp 18   SpO2 98%   Physical Exam Vitals and nursing note reviewed.  Constitutional:      General: He is not in acute distress.    Appearance: Normal appearance. He is not ill-appearing or diaphoretic.     Comments: Noticeably in discomfort and pain.  HENT:     Head: Normocephalic and atraumatic.  Eyes:     General: No scleral icterus.       Right eye: No discharge.        Left eye: No discharge.     Extraocular Movements: Extraocular movements intact.     Conjunctiva/sclera: Conjunctivae normal.  Cardiovascular:     Rate and Rhythm: Normal rate and regular rhythm.     Pulses: Normal pulses.     Heart sounds: Normal heart sounds. No murmur heard.    No friction rub. No  gallop.  Pulmonary:     Effort: Pulmonary effort is normal. No respiratory distress.     Breath sounds: No stridor. No wheezing, rhonchi or rales.  Chest:     Chest wall: No tenderness.  Abdominal:     General: Abdomen is flat. There is no distension.     Palpations: Abdomen is soft.     Tenderness: There is no abdominal tenderness. There is right CVA tenderness and left CVA tenderness. There is no guarding or rebound.  Musculoskeletal:        General: No swelling, deformity or signs of injury.     Cervical back: Normal range of motion. No rigidity.     Right lower leg: No edema.     Left lower leg: No edema.  Skin:    General: Skin is  warm and dry.     Findings: No bruising, erythema or lesion.  Neurological:     General: No focal deficit present.     Mental Status: He is alert and oriented to person, place, and time. Mental status is at baseline.     Sensory: No sensory deficit.     Motor: No weakness.  Psychiatric:        Mood and Affect: Mood normal.     (all labs ordered are listed, but only abnormal results are displayed) Labs Reviewed  URINALYSIS, ROUTINE W REFLEX MICROSCOPIC - Abnormal; Notable for the following components:      Result Value   APPearance HAZY (*)    Hgb urine dipstick LARGE (*)    Leukocytes,Ua SMALL (*)    All other components within normal limits  CBC WITH DIFFERENTIAL/PLATELET - Abnormal; Notable for the following components:   WBC 11.8 (*)    All other components within normal limits  BASIC METABOLIC PANEL WITH GFR - Abnormal; Notable for the following components:   Glucose, Bld 100 (*)    All other components within normal limits    EKG: None  Radiology: CT Renal Stone Study Result Date: 01/28/2024 CLINICAL DATA:  Left-sided flank pain EXAM: CT ABDOMEN AND PELVIS WITHOUT CONTRAST TECHNIQUE: Multidetector CT imaging of the abdomen and pelvis was performed following the standard protocol without IV contrast. RADIATION DOSE REDUCTION: This exam was performed according to the departmental dose-optimization program which includes automated exposure control, adjustment of the mA and/or kV according to patient size and/or use of iterative reconstruction technique. COMPARISON:  CT 12/16/2023, 08/12/2023 FINDINGS: Lower chest: No acute abnormality. Hepatobiliary: Hepatic steatosis. No calcified gallstone or biliary dilatation Pancreas: Unremarkable. No pancreatic ductal dilatation or surrounding inflammatory changes. Spleen: Normal in size without focal abnormality. Adrenals/Urinary Tract: Adrenal glands are normal. No hydronephrosis. Small lower pole stone on the left. Punctate stone lower  pole right kidney. The bladder is unremarkable Stomach/Bowel: Stomach is within normal limits. Appendix appears normal. No evidence of bowel wall thickening, distention, or inflammatory changes. Vascular/Lymphatic: No significant vascular findings are present. No enlarged abdominal or pelvic lymph nodes. Reproductive: Prostate is unremarkable. Other: Negative for pelvic effusion or free air. Small fat containing left inguinal hernia Musculoskeletal: No acute osseous abnormality. IMPRESSION: 1. No CT evidence for acute intra-abdominal or pelvic abnormality. 2. Small bilateral nonobstructing kidney stones. 3. Hepatic steatosis. Electronically Signed   By: Luke Bun M.D.   On: 01/28/2024 21:03    Procedures   Medications Ordered in the ED  ketorolac  (TORADOL ) 15 MG/ML injection 15 mg (15 mg Intramuscular Given 01/28/24 2134)  oxyCODONE -acetaminophen  (PERCOCET /ROXICET) 5-325 MG per tablet 1 tablet (1  tablet Oral Given 01/28/24 2135)  ondansetron  (ZOFRAN -ODT) disintegrating tablet 4 mg (4 mg Oral Given 01/28/24 2135)  oxyCODONE -acetaminophen  (PERCOCET /ROXICET) 5-325 MG per tablet 1 tablet (1 tablet Oral Given 01/28/24 2228)  morphine  (PF) 4 MG/ML injection 4 mg (4 mg Intramuscular Given 01/28/24 2340)  morphine  (PF) 4 MG/ML injection 4 mg (4 mg Intravenous Given 01/29/24 0012)                                  Medical Decision Making Amount and/or Complexity of Data Reviewed Labs: ordered. Radiology: ordered.  Risk Prescription drug management.   This patient is a 32 year old male who presents to the ED for concern of nephrolithiasis.  Noted to have large hemoglobin in the urine and already told that he has a kidney stone.  Came in today for pain control.  On physical exam, patient is in no acute distress, afebrile, alert and orient x 4, speaking in full sentences, nontachypneic, nontachycardic.  Notably in pain, with bilateral CVA tenderness.  Worse on left than right.  Exam is otherwise  unremarkable.  UA does not large hemoglobin today and mildly other white count Levan 0.8 with small leukocytes and no bacteria.  CT renal study does show bilateral punctate stones but no other acute findings.  Provided some pain medications as well as some nausea meds. Patient states that pain is slightly improved but is requesting IV medications at this time.  Provided 1 more dose of 5 medications and said that after this he will need to follow-up with urology and PCP for further evaluation.  Bided outpatient meds.  Patient vital signs have remained stable throughout the course of patient's time in the ED. Low suspicion for any other emergent pathology at this time. I believe this patient is safe to be discharged. Provided strict return to ER precautions. Patient expressed agreement and understanding of plan. All questions were answered. Differential diagnoses prior to evaluation: The emergent differential diagnosis includes, but is not limited to,  Fracture (acute/chronic), muscle strain, cauda equina, spinal stenosis, DDD, metastatic cancer, vertebral osteomyelitis, kidney stone, pyelonephritis, AAA, pancreatitis, bowel obstruction, meningitis.  . This is not an exhaustive differential.   Past Medical History / Co-morbidities / Social History: Cellulitis, nephrolithiasis  Additional history: Chart reviewed. Pertinent results include:   Last seen on 12/07/2023 for flank pain provided Toradol , Zofran  noting no stones that time on CT.  Likely musculoskeletal pain.  Lab Tests/Imaging studies: I personally interpreted labs/imaging and the pertinent results include:    CBC notes a mildly elevated white count at 11.8 BMP unremarkable UA notes large hemoglobin and small leukocytes but otherwise unremarkable CT renal study shows Small bilateral nonobstructing kidney stones  I agree with the radiologist interpretation.    Medications: I ordered medication including oxycodone , Zofran , morphine ,  Toradol .  I have reviewed the patients home medicines and have made adjustments as needed.  Critical Interventions: None  Social Determinants of Health: None  Disposition: After consideration of the diagnostic results and the patients response to treatment, I feel that the patient would benefit from discharge and treatment as above.   emergency department workup does not suggest an emergent condition requiring admission or immediate intervention beyond what has been performed at this time. The plan is: Follow-up with PCP, follow-up with urology, symptomatic management at home. The patient is safe for discharge and has been instructed to return immediately for worsening symptoms, change in symptoms or any  other concerns.   Final diagnoses:  Nephrolithiasis    ED Discharge Orders          Ordered    tamsulosin  (FLOMAX ) 0.4 MG CAPS capsule  Daily        01/29/24 0009    ondansetron  (ZOFRAN ) 4 MG tablet  Every 8 hours PRN        01/28/24 2346    oxyCODONE -acetaminophen  (PERCOCET /ROXICET) 5-325 MG tablet  Every 6 hours PRN        01/28/24 2346               Beola Terrall RAMAN, PA-C 01/29/24 0020    Randol Simmonds, MD 01/30/24 [phone]

## 2024-01-28 NOTE — Discharge Instructions (Signed)
 You are seen to have kidney stones in both kidneys, small.  I have sent home some pain medication for you as well as some nausea medication and also sending you home with some medication to help facilitate the exit of the stones.  Please return to follow-up with urologist if you have persistent symptoms.  Return to the ED for any uncontrolled pain, uncontrolled vomiting.  [facility] the main [facility] group here in [place], however provided a group open reasonable for you to also reach out to for control if you are unable to get in touch with [facility].

## 2024-01-29 MED ORDER — TAMSULOSIN HCL 0.4 MG PO CAPS: 0.4000 mg | ORAL_CAPSULE | Freq: Every day | ORAL | 0 refills | Status: DC

## 2024-01-29 MED ORDER — MORPHINE SULFATE (PF) 4 MG/ML IV SOLN
4.0000 mg | Freq: Once | INTRAVENOUS | Status: AC
  Administered 2024-01-29: 4 mg via INTRAVENOUS
  Filled 2024-01-29: qty 1

## 2024-03-25 ENCOUNTER — Emergency Department (HOSPITAL_BASED_OUTPATIENT_CLINIC_OR_DEPARTMENT_OTHER)

## 2024-03-25 ENCOUNTER — Other Ambulatory Visit: Payer: Self-pay

## 2024-03-25 ENCOUNTER — Emergency Department (HOSPITAL_BASED_OUTPATIENT_CLINIC_OR_DEPARTMENT_OTHER)
Admission: EM | Admit: 2024-03-25 | Discharge: 2024-03-25 | Disposition: A | Discharge status: Home / Self Care | Attending: Emergency Medicine | Admitting: Emergency Medicine

## 2024-03-25 DIAGNOSIS — R10A2 Flank pain, left side: Secondary | ICD-10-CM | POA: Insufficient documentation

## 2024-03-25 DIAGNOSIS — R319 Hematuria, unspecified: Secondary | ICD-10-CM | POA: Insufficient documentation

## 2024-03-25 DIAGNOSIS — Z91040 Latex allergy status: Secondary | ICD-10-CM | POA: Diagnosis not present

## 2024-03-25 DIAGNOSIS — R110 Nausea: Secondary | ICD-10-CM | POA: Diagnosis not present

## 2024-03-25 LAB — URINALYSIS, ROUTINE W REFLEX MICROSCOPIC
Bilirubin Urine: NEGATIVE
Glucose, UA: NEGATIVE mg/dL
Ketones, ur: NEGATIVE mg/dL
Nitrite: NEGATIVE
Specific Gravity, Urine: 1.03 (ref 1.005–1.030)
WBC, UA: >50 WBC/hpf (ref 0–5)
pH: 6 (ref 5.0–8.0)

## 2024-03-25 LAB — CBC WITH DIFFERENTIAL/PLATELET
Abs Immature Granulocytes: 0.03 K/uL (ref 0.00–0.07)
Basophils Absolute: 0.1 K/uL (ref 0.0–0.1)
Basophils Relative: 1 %
Eosinophils Absolute: 0.3 K/uL (ref 0.0–0.5)
Eosinophils Relative: 3 %
HCT: 48.6 % (ref 39.0–52.0)
Hemoglobin: 16.5 g/dL (ref 13.0–17.0)
Immature Granulocytes: 0 %
Lymphocytes Relative: 34 %
Lymphs Abs: 4 K/uL (ref 0.7–4.0)
MCH: 29.5 pg (ref 26.0–34.0)
MCHC: 34 g/dL (ref 30.0–36.0)
MCV: 86.8 fL (ref 80.0–100.0)
Monocytes Absolute: 1 K/uL (ref 0.1–1.0)
Monocytes Relative: 9 %
Neutro Abs: 6.3 K/uL (ref 1.7–7.7)
Neutrophils Relative %: 53 %
Platelets: 343 K/uL (ref 150–400)
RBC: 5.6 MIL/uL (ref 4.22–5.81)
RDW: 14 % (ref 11.5–15.5)
WBC: 11.8 K/uL — ABNORMAL HIGH (ref 4.0–10.5)
nRBC: 0 % (ref 0.0–0.2)

## 2024-03-25 LAB — COMPREHENSIVE METABOLIC PANEL WITH GFR
ALT: 31 U/L (ref 0–44)
AST: 23 U/L (ref 15–41)
Albumin: 4.5 g/dL (ref 3.5–5.0)
Alkaline Phosphatase: 110 U/L (ref 38–126)
Anion gap: 11 (ref 5–15)
BUN: 14 mg/dL (ref 6–20)
CO2: 26 mmol/L (ref 22–32)
Calcium: 9.8 mg/dL (ref 8.9–10.3)
Chloride: 100 mmol/L (ref 98–111)
Creatinine, Ser: 1 mg/dL (ref 0.61–1.24)
GFR, Estimated: >60 mL/min (ref >60)
Glucose, Bld: 96 mg/dL (ref 70–99)
Potassium: 3.9 mmol/L (ref 3.5–5.1)
Sodium: 136 mmol/L (ref 135–145)
Total Bilirubin: 0.8 mg/dL (ref 0.0–1.2)
Total Protein: 8.6 g/dL — ABNORMAL HIGH (ref 6.5–8.1)

## 2024-03-25 MED ORDER — MORPHINE SULFATE (PF) 4 MG/ML IV SOLN: 4.0000 mg | Freq: Once | INTRAVENOUS | Status: DC

## 2024-03-25 MED ORDER — OXYCODONE HCL 5 MG PO TABS: 5.0000 mg | ORAL_TABLET | Freq: Three times a day (TID) | ORAL | 0 refills | Status: DC | PRN

## 2024-03-25 MED ORDER — SODIUM CHLORIDE 0.9 % IV BOLUS
1000.0000 mL | Freq: Once | INTRAVENOUS | Status: AC
  Administered 2024-03-25: 1000 mL via INTRAVENOUS

## 2024-03-25 MED ORDER — ONDANSETRON 4 MG PO TBDP: 4.0000 mg | ORAL_TABLET | Freq: Three times a day (TID) | ORAL | 0 refills | Status: DC | PRN

## 2024-03-25 MED ORDER — SODIUM CHLORIDE 0.9 % IV SOLN
1.0000 g | Freq: Once | INTRAVENOUS | Status: AC
  Administered 2024-03-25: 1 g via INTRAVENOUS
  Filled 2024-03-25: qty 10

## 2024-03-25 MED ORDER — CEPHALEXIN 500 MG PO CAPS
500.0000 mg | ORAL_CAPSULE | Freq: Four times a day (QID) | ORAL | 0 refills | Status: DC
Start: 2024-03-26 — End: 2024-04-02

## 2024-03-25 MED ORDER — MORPHINE SULFATE (PF) 4 MG/ML IV SOLN
4.0000 mg | Freq: Once | INTRAVENOUS | Status: AC
  Administered 2024-03-25: 4 mg via INTRAVENOUS
  Filled 2024-03-25: qty 1

## 2024-03-25 MED ORDER — OXYCODONE HCL 5 MG PO TABS
5.0000 mg | ORAL_TABLET | Freq: Once | ORAL | Status: AC | PRN
  Administered 2024-03-25: 5 mg via ORAL
  Filled 2024-03-25: qty 1

## 2024-03-25 MED ORDER — KETOROLAC TROMETHAMINE 30 MG/ML IJ SOLN
30.0000 mg | Freq: Once | INTRAMUSCULAR | Status: AC
  Administered 2024-03-25: 30 mg via INTRAVENOUS
  Filled 2024-03-25: qty 1

## 2024-03-25 MED ORDER — OXYCODONE-ACETAMINOPHEN 5-325 MG PO TABS
1.0000 | ORAL_TABLET | ORAL | Status: AC | PRN
  Administered 2024-03-25 (×2): 1 via ORAL
  Filled 2024-03-25 (×2): qty 1

## 2024-03-25 NOTE — ED Provider Notes (Signed)
 [place] EMERGENCY DEPARTMENT AT [facility] Provider Note   CSN: [id number] Arrival date & time: 03/25/24  1741     Patient presents with: Flank Pain   Jacob Nixon. is a 33 y.o. male presenting emerged from with complaint of flank pain and abdominal pain.  Patient reports that this has been chronic and ongoing for several months.  He has seen a urologist for a proximal ureteral stone in the left pole of the kidney, and told that they were going to watch this conservatively.  He continues to have frequent left-sided colicky pains in the daytime.  It flared up much worse yesterday where he was having gross hematuria.  He says the pain is too much to tolerate.  He is also nauseated and feels dehydrated from this.   HPI     Prior to Admission medications   Medication Sig Start Date End Date Taking? Authorizing Provider  cephALEXin  (KEFLEX ) 500 MG capsule Take 1 capsule (500 mg total) by mouth 4 (four) times daily for 7 days. 03/26/24 04/02/24 Yes Sally-Anne Wamble, Donnice PARAS, MD  ondansetron  (ZOFRAN -ODT) 4 MG disintegrating tablet Take 1 tablet (4 mg total) by mouth every 8 (eight) hours as needed for up to 12 doses for nausea or vomiting. 03/25/24  Yes Isaak Delmundo, Donnice PARAS, MD  oxyCODONE  (ROXICODONE ) 5 MG immediate release tablet Take 1 tablet (5 mg total) by mouth every 8 (eight) hours as needed for up to 12 doses for severe pain (pain score 7-10). 03/25/24  Yes Cottie Donnice PARAS, MD  fexofenadine (ALLEGRA) 180 MG tablet Take 180 mg by mouth daily as needed for allergies or rhinitis.    [provider]  ibuprofen  (ADVIL ) 800 MG tablet Take 1 tablet (800 mg total) by mouth every 6 (six) hours as needed for moderate pain (pain score 4-6). 08/13/23   Mesner, Selinda, MD  ketorolac  (TORADOL ) 10 MG tablet Take 1 tablet (10 mg total) by mouth every 6 (six) hours as needed. 12/16/23   Prosperi, Christian H, PA-C  ondansetron  (ZOFRAN ) 4 MG tablet Take 1 tablet (4 mg total) by mouth every 8  (eight) hours as needed for nausea or vomiting. 01/28/24   Bauer, Collin S, PA-C  ondansetron  (ZOFRAN -ODT) 4 MG disintegrating tablet Take 1 tablet (4 mg total) by mouth every 6 (six) hours as needed for nausea or vomiting. 12/16/23   Prosperi, Christian H, PA-C  oxyCODONE -acetaminophen  (PERCOCET ) 5-325 MG tablet Take 1-2 tablets by mouth every 8 (eight) hours as needed for severe pain (pain score 7-10). 08/13/23   Mesner, Selinda, MD  oxyCODONE -acetaminophen  (PERCOCET /ROXICET) 5-325 MG tablet Take 1 tablet by mouth every 6 (six) hours as needed for severe pain (pain score 7-10). 01/28/24   Beola Terrall RAMAN, PA-C  tamsulosin  (FLOMAX ) 0.4 MG CAPS capsule Take 1 capsule (0.4 mg total) by mouth daily. Until stone passes 01/29/24   Beola Terrall RAMAN, PA-C    Allergies: Fentanyl , Coconut flavoring agent (non-screening), Penicillins, and Latex    Review of Systems  Updated Vital Signs BP (!) 100/56   Pulse 73   Temp 97.9 F (36.6 C) (Oral)   Resp 16   Ht 5' 11 (1.803 m)   Wt 115.7 kg   SpO2 98%   BMI 35.57 kg/m   Physical Exam Constitutional:      General: He is not in acute distress.    Appearance: He is obese.  HENT:     Head: Normocephalic and atraumatic.  Eyes:     Conjunctiva/sclera: Conjunctivae  normal.     Pupils: Pupils are equal, round, and reactive to light.  Cardiovascular:     Rate and Rhythm: Normal rate and regular rhythm.  Pulmonary:     Effort: Pulmonary effort is normal. No respiratory distress.  Abdominal:     General: There is no distension.     Tenderness: There is no abdominal tenderness. There is left CVA tenderness. There is no right CVA tenderness.  Skin:    General: Skin is warm and dry.  Neurological:     General: No focal deficit present.     Mental Status: He is alert. Mental status is at baseline.  Psychiatric:        Mood and Affect: Mood normal.        Behavior: Behavior normal.     (all labs ordered are listed, but only abnormal results are  displayed) Labs Reviewed  URINALYSIS, ROUTINE W REFLEX MICROSCOPIC - Abnormal; Notable for the following components:      Result Value   Hgb urine dipstick MODERATE (*)    Protein, ur TRACE (*)    Leukocytes,Ua MODERATE (*)    Bacteria, UA RARE (*)    Crystals PRESENT (*)    All other components within normal limits  CBC WITH DIFFERENTIAL/PLATELET - Abnormal; Notable for the following components:   WBC 11.8 (*)    All other components within normal limits  COMPREHENSIVE METABOLIC PANEL WITH GFR - Abnormal; Notable for the following components:   Total Protein 8.6 (*)    All other components within normal limits  URINE CULTURE    EKG: None  Radiology: CT Renal Stone Study Result Date: 03/25/2024 CLINICAL DATA:  Left flank pain, hematuria, history of urinary tract calculus EXAM: CT ABDOMEN AND PELVIS WITHOUT CONTRAST TECHNIQUE: Multidetector CT imaging of the abdomen and pelvis was performed following the standard protocol without IV contrast. RADIATION DOSE REDUCTION: This exam was performed according to the departmental dose-optimization program which includes automated exposure control, adjustment of the mA and/or kV according to patient size and/or use of iterative reconstruction technique. COMPARISON:  01/28/2024 FINDINGS: Lower chest: No acute pleural or parenchymal lung disease. Hepatobiliary: Unremarkable unenhanced appearance of the liver and gallbladder. No biliary duct dilation. Pancreas: Unremarkable unenhanced appearance. Spleen: Unremarkable unenhanced appearance. Adrenals/Urinary Tract: Stable 6 mm nonobstructing calculus lower pole left kidney. There are 2 punctate less than 2 mm nonobstructing right renal calculi, also unchanged since prior exam. No obstructive uropathy within either kidney. The adrenals and bladder appear unremarkable. Stomach/Bowel: No bowel obstruction or ileus. Normal appendix right lower quadrant. No bowel wall thickening or inflammatory change.  Vascular/Lymphatic: No significant vascular findings are present. No enlarged abdominal or pelvic lymph nodes. Reproductive: Prostate is unremarkable. Other: No free fluid or free intraperitoneal gas. No abdominal wall hernia. Musculoskeletal: No acute or destructive bony abnormalities. Reconstructed images demonstrate no additional findings. IMPRESSION: 1. Stable bilateral nonobstructing renal calculi. 2. Otherwise unremarkable unenhanced exam.  Normal appendix. Electronically Signed   By: Ozell Daring M.D.   On: 03/25/2024 20:04     Procedures   Medications Ordered in the ED  cefTRIAXone  (ROCEPHIN ) 1 g in sodium chloride  0.9 % 100 mL IVPB (1 g Intravenous New Bag/Given 03/25/24 2303)  oxyCODONE  (Oxy IR/ROXICODONE ) immediate release tablet 5 mg (has no administration in time range)  oxyCODONE -acetaminophen  (PERCOCET /ROXICET) 5-325 MG per tablet 1 tablet (1 tablet Oral Given 03/25/24 2205)  morphine  (PF) 4 MG/ML injection 4 mg (4 mg Intravenous Given 03/25/24 2045)  sodium chloride  0.9 %  bolus 1,000 mL (0 mLs Intravenous Stopped 03/25/24 2206)  ketorolac  (TORADOL ) 30 MG/ML injection 30 mg (30 mg Intravenous Given 03/25/24 2045)                                    Medical Decision Making Amount and/or Complexity of Data Reviewed Labs: ordered.  Risk Prescription drug management.   This patient presents to the ED with concern for left-sided flank pain, hematuria. This involves an extensive number of treatment options, and is a complaint that carries with it a high risk of complications and morbidity.  The differential diagnosis includes UTI versus ureteral stone versus other I   I ordered and personally interpreted labs.  The pertinent results include: Creatinine within normal limits. ;  UA with leukocytes and blood and crystals noted  I ordered imaging studies including CT renal stone study I independently visualized and interpreted imaging which showed 6 mm left renal pole stone, small 2  mm right-sided stones I agree with the radiologist interpretation   I ordered medication including pain medication and fluids; IV Rocephin  for potential UTI  I have reviewed the patients home medicines and have made adjustments as needed  Test Considered: Low suspicion for testicular pathology, doubt mesenteric ischemia, no indication for angiogram or contrasted CT scan of the abdomen.  After the interventions noted above, I reevaluated the patient and found that they have: improved - pain improved  Overall it is unclear to me what the exact source of his gross hematuria would be, given that the ureteral stone does not appear to be actively passing.  He also was noted of crystals in the urine.  I do think it is reasonable to treat for potential infection, given these findings and painful urination, so we will do IV antibiotics here and prescribe antibiotics at home.  But he will need to follow-up with urology again.  Dispostion:  After consideration of the diagnostic results and the patients response to treatment, I feel that the patent would benefit from outpatient urology follow-up      Final diagnoses:  Left flank pain  Hematuria, unspecified type    ED Discharge Orders          Ordered    cephALEXin  (KEFLEX ) 500 MG capsule  4 times daily        03/25/24 2257    oxyCODONE  (ROXICODONE ) 5 MG immediate release tablet  Every 8 hours PRN        03/25/24 2257    ondansetron  (ZOFRAN -ODT) 4 MG disintegrating tablet  Every 8 hours PRN        03/25/24 2257               Cottie Donnice PARAS, MD 03/25/24 2324

## 2024-03-25 NOTE — ED Triage Notes (Addendum)
 Pt POV reporting L flank pain and hematuria since last night, known kidney stone x1 month, followed by urology.

## 2024-03-25 NOTE — Discharge Instructions (Addendum)
 You are being treated for a possible urinary infection.  Your urine culture will take another 3 to 4 days to result to confirm whether this is a true infection.  But in the meantime, we gave you IV antibiotics in the ER.  I prescribed you oral antibiotics to begin taking at home tomorrow.  I also prescribed you a short course of oxycodone  which is a narcotic for severe pain.  He can continue taking ibuprofen  otherwise at home.

## 2024-03-27 LAB — URINE CULTURE: Culture: <10000 — AB

## 2024-03-30 ENCOUNTER — Other Ambulatory Visit: Payer: Self-pay | Admitting: Urology

## 2024-03-31 ENCOUNTER — Encounter (HOSPITAL_COMMUNITY): Payer: Self-pay | Admitting: Urology

## 2024-03-31 NOTE — Progress Notes (Signed)
 LITHO PREOP PHONE CALL   ALLERGIES REVIEWED: YES  MEDICATION REVIEW DONE: YES MEDICATIONS THAT PT SHOULD HOLD (LIST): NSAIDS hold 48hr, Tordol hold 24hr  CAN PT WALK UP STAIRS WITHOUT SHORTNESS OF BREATH: YES HOME O2: NO CPAP: NO  IF YES, INFORMED PT TO BRING CPAP WITH TUBING AND MASK:YES/NO   INFORMED DRIVER NEEDED FOR PROCEDURE: YES/NO   PT WAS GIVEN BLUE FOLDER AT UROLOGY APPT: YES PT INFORMED TO BRING BLUE FOLDER WITH ALL CONTENTS: YES  REVIEWED ARRIVAL TIME AND LOCATION: YES  OTHER PERTINENT INFORMATION: Patient stated last Litho he had done was very painful, he wasn't really asleep. I told him they dont put you under with anesthesia its more some relaxing medications but may not go to sleep but should be comfortable. He stated he felt every zap last time. I told him to discuss with MD that day but we would try to get him more comfortable with this one.

## 2024-04-02 ENCOUNTER — Encounter (HOSPITAL_COMMUNITY): Payer: Self-pay | Admitting: Urology

## 2024-04-02 ENCOUNTER — Encounter (HOSPITAL_COMMUNITY)
Admission: RE | Disposition: A | Discharge status: Home / Self Care | Payer: Self-pay | Source: Home / Self Care | Attending: Urology

## 2024-04-02 ENCOUNTER — Ambulatory Visit (HOSPITAL_COMMUNITY): Admitting: Anesthesiology

## 2024-04-02 ENCOUNTER — Ambulatory Visit (HOSPITAL_COMMUNITY)

## 2024-04-02 ENCOUNTER — Ambulatory Visit (HOSPITAL_COMMUNITY)
Admission: RE | Admit: 2024-04-02 | Discharge: 2024-04-02 | Disposition: A | Discharge status: Home / Self Care | Attending: Urology | Admitting: Urology

## 2024-04-02 ENCOUNTER — Encounter: Admission: RE | Disposition: A | Payer: Self-pay | Attending: Urology

## 2024-04-02 DIAGNOSIS — N200 Calculus of kidney: Secondary | ICD-10-CM

## 2024-04-02 DIAGNOSIS — N201 Calculus of ureter: Secondary | ICD-10-CM | POA: Insufficient documentation

## 2024-04-02 DIAGNOSIS — Z87891 Personal history of nicotine dependence: Secondary | ICD-10-CM | POA: Diagnosis not present

## 2024-04-02 HISTORY — PX: CYSTOSCOPY/URETEROSCOPY/HOLMIUM LASER/STENT PLACEMENT: SHX6546

## 2024-04-02 SURGERY — LITHOTRIPSY, ESWL
Anesthesia: IV Sedation (MBSC Only) | Laterality: Left

## 2024-04-02 SURGERY — CYSTOSCOPY/URETEROSCOPY/HOLMIUM LASER/STENT PLACEMENT
Anesthesia: General | Laterality: Left

## 2024-04-02 MED ORDER — PROPOFOL 10 MG/ML IV BOLUS
INTRAVENOUS | Status: AC
  Filled 2024-04-02: qty 20

## 2024-04-02 MED ORDER — FENTANYL CITRATE (PF) 100 MCG/2ML IJ SOLN
INTRAMUSCULAR | Status: DC | PRN
  Administered 2024-04-02: 100 ug via INTRAVENOUS

## 2024-04-02 MED ORDER — FENTANYL CITRATE (PF) 50 MCG/ML IJ SOSY
PREFILLED_SYRINGE | INTRAMUSCULAR | Status: AC
  Filled 2024-04-02: qty 1

## 2024-04-02 MED ORDER — BISACODYL 5 MG PO TBEC
5.0000 mg | DELAYED_RELEASE_TABLET | Freq: Every day | ORAL | Status: DC | PRN
Start: 2024-04-02 — End: 2024-04-02

## 2024-04-02 MED ORDER — OXYCODONE HCL 5 MG PO TABS: 5.0000 mg | ORAL_TABLET | Freq: Three times a day (TID) | ORAL | 0 refills | Status: AC | PRN

## 2024-04-02 MED ORDER — KETOROLAC TROMETHAMINE 15 MG/ML IJ SOLN
INTRAMUSCULAR | Status: AC
  Filled 2024-04-02: qty 1

## 2024-04-02 MED ORDER — ACETAMINOPHEN 10 MG/ML IV SOLN
1000.0000 mg | Freq: Once | INTRAVENOUS | Status: DC | PRN
  Administered 2024-04-02: 1000 mg via INTRAVENOUS

## 2024-04-02 MED ORDER — CIPROFLOXACIN HCL 500 MG PO TABS
500.0000 mg | ORAL_TABLET | ORAL | Status: AC
  Administered 2024-04-02: 500 mg via ORAL
  Filled 2024-04-02: qty 1

## 2024-04-02 MED ORDER — FENTANYL CITRATE (PF) 50 MCG/ML IJ SOSY
25.0000 ug | PREFILLED_SYRINGE | INTRAMUSCULAR | Status: DC | PRN
  Administered 2024-04-02 (×2): 50 ug via INTRAVENOUS
  Administered 2024-04-02 (×2): 25 ug via INTRAVENOUS

## 2024-04-02 MED ORDER — ONDANSETRON HCL 4 MG/2ML IJ SOLN
INTRAMUSCULAR | Status: AC
  Filled 2024-04-02: qty 2

## 2024-04-02 MED ORDER — OXYCODONE HCL 5 MG PO TABS
5.0000 mg | ORAL_TABLET | Freq: Once | ORAL | Status: AC | PRN
  Administered 2024-04-02: 5 mg via ORAL

## 2024-04-02 MED ORDER — GLYCOPYRROLATE 0.2 MG/ML IJ SOLN
INTRAMUSCULAR | Status: DC | PRN
  Administered 2024-04-02: .2 mg via INTRAVENOUS

## 2024-04-02 MED ORDER — ACETAMINOPHEN 10 MG/ML IV SOLN
INTRAVENOUS | Status: AC
  Filled 2024-04-02: qty 100

## 2024-04-02 MED ORDER — OXYCODONE HCL 5 MG/5ML PO SOLN: 5.0000 mg | Freq: Once | ORAL | Status: AC | PRN

## 2024-04-02 MED ORDER — SODIUM CHLORIDE 0.9 % IV SOLN: INTRAVENOUS | Status: DC

## 2024-04-02 MED ORDER — MIDAZOLAM HCL 5 MG/5ML IJ SOLN
INTRAMUSCULAR | Status: DC | PRN
Start: 2024-04-02 — End: 2024-04-02
  Administered 2024-04-02: 2 mg via INTRAVENOUS

## 2024-04-02 MED ORDER — IOHEXOL 300 MG/ML  SOLN
INTRAMUSCULAR | Status: DC | PRN
  Administered 2024-04-02: 20 mL

## 2024-04-02 MED ORDER — FENTANYL CITRATE (PF) 100 MCG/2ML IJ SOLN
INTRAMUSCULAR | Status: AC
  Filled 2024-04-02: qty 2

## 2024-04-02 MED ORDER — DEXAMETHASONE SOD PHOSPHATE PF 10 MG/ML IJ SOLN
INTRAMUSCULAR | Status: DC | PRN
  Administered 2024-04-02: 10 mg via INTRAVENOUS

## 2024-04-02 MED ORDER — ONDANSETRON HCL 4 MG/2ML IJ SOLN
INTRAMUSCULAR | Status: DC | PRN
  Administered 2024-04-02: 4 mg via INTRAVENOUS

## 2024-04-02 MED ORDER — MIDAZOLAM HCL 2 MG/2ML IJ SOLN
INTRAMUSCULAR | Status: AC
  Filled 2024-04-02: qty 2

## 2024-04-02 MED ORDER — FENTANYL CITRATE (PF) 50 MCG/ML IJ SOSY
PREFILLED_SYRINGE | INTRAMUSCULAR | Status: AC
  Filled 2024-04-02: qty 2

## 2024-04-02 MED ORDER — DIAZEPAM 5 MG PO TABS
10.0000 mg | ORAL_TABLET | ORAL | Status: AC
  Administered 2024-04-02: 10 mg via ORAL
  Filled 2024-04-02: qty 2

## 2024-04-02 MED ORDER — DIAZEPAM 2 MG PO TABS
10.0000 mg | ORAL_TABLET | Freq: Once | ORAL | Status: AC | PRN
  Administered 2024-04-02: 10 mg via ORAL

## 2024-04-02 MED ORDER — LIDOCAINE HCL (PF) 2 % IJ SOLN
INTRAMUSCULAR | Status: AC
  Filled 2024-04-02: qty 5

## 2024-04-02 MED ORDER — LIDOCAINE HCL (PF) 2 % IJ SOLN
INTRAMUSCULAR | Status: DC | PRN
  Administered 2024-04-02: 100 mg via INTRADERMAL

## 2024-04-02 MED ORDER — TAMSULOSIN HCL 0.4 MG PO CAPS: 0.4000 mg | ORAL_CAPSULE | Freq: Every day | ORAL | 0 refills | Status: AC

## 2024-04-02 MED ORDER — DIAZEPAM 2 MG PO TABS
ORAL_TABLET | ORAL | Status: AC
  Filled 2024-04-02: qty 5

## 2024-04-02 MED ORDER — DROPERIDOL 2.5 MG/ML IJ SOLN: 0.6250 mg | Freq: Once | INTRAMUSCULAR | Status: DC | PRN

## 2024-04-02 MED ORDER — KETOROLAC TROMETHAMINE 15 MG/ML IJ SOLN
15.0000 mg | Freq: Once | INTRAMUSCULAR | Status: AC | PRN
  Administered 2024-04-02: 15 mg via INTRAVENOUS

## 2024-04-02 MED ORDER — DIPHENHYDRAMINE HCL 25 MG PO CAPS
25.0000 mg | ORAL_CAPSULE | ORAL | Status: AC
  Administered 2024-04-02: 25 mg via ORAL
  Filled 2024-04-02: qty 1

## 2024-04-02 MED ORDER — SODIUM CHLORIDE 0.9 % IR SOLN
Status: DC | PRN
  Administered 2024-04-02: 3000 mL

## 2024-04-02 MED ORDER — EPHEDRINE SULFATE (PRESSORS) 25 MG/5ML IV SOSY
PREFILLED_SYRINGE | INTRAVENOUS | Status: DC | PRN
  Administered 2024-04-02: 10 mg via INTRAVENOUS

## 2024-04-02 MED ORDER — LIDOCAINE HCL URETHRAL/MUCOSAL 2 % EX GEL
CUTANEOUS | Status: AC
  Filled 2024-04-02: qty 30

## 2024-04-02 MED ORDER — OXYCODONE HCL 5 MG PO TABS
ORAL_TABLET | ORAL | Status: AC
  Filled 2024-04-02: qty 1

## 2024-04-02 MED ORDER — PROPOFOL 10 MG/ML IV BOLUS
INTRAVENOUS | Status: DC | PRN
  Administered 2024-04-02: 150 mg via INTRAVENOUS

## 2024-04-02 SURGICAL SUPPLY — 21 items
BAG URO CATCHER STRL LF (MISCELLANEOUS) ×2 IMPLANT
BASKET ZERO TIP NITINOL 2.4FR (BASKET) IMPLANT
BENZOIN TINCTURE PRP APPL 2/3 (GAUZE/BANDAGES/DRESSINGS) IMPLANT
CATH URETERAL DUAL LUMEN 10F (MISCELLANEOUS) IMPLANT
CATH URETL OPEN 5X70 (CATHETERS) ×2 IMPLANT
CLOTH BEACON ORANGE TIMEOUT ST (SAFETY) ×2 IMPLANT
DRSG TEGADERM 2-3/8X2-3/4 SM (GAUZE/BANDAGES/DRESSINGS) IMPLANT
EXTRACTOR STONE NITINOL NGAGE (UROLOGICAL SUPPLIES) IMPLANT
GLOVE SURG LX STRL 7.5 STRW (GLOVE) ×2 IMPLANT
GLOVE SURG SS PI 7.5 STRL IVOR (GLOVE) IMPLANT
GOWN STRL REUS W/ TWL XL LVL3 (GOWN DISPOSABLE) ×2 IMPLANT
GUIDEWIRE ANG ZIPWIRE 038X150 (WIRE) IMPLANT
GUIDEWIRE STR DUAL SENSOR (WIRE) ×2 IMPLANT
KIT TURNOVER KIT A (KITS) ×2 IMPLANT
MANIFOLD NEPTUNE II (INSTRUMENTS) ×2 IMPLANT
PACK CYSTO (CUSTOM PROCEDURE TRAY) ×2 IMPLANT
SHEATH NAVIGATOR HD 11/13X28 (SHEATH) IMPLANT
SHEATH NAVIGATOR HD 11/13X36 (SHEATH) IMPLANT
TRACTIP FLEXIVA PULS ID 200XHI (Laser) IMPLANT
TUBING CONNECTING 10 (TUBING) ×2 IMPLANT
TUBING UROLOGY SET (TUBING) ×2 IMPLANT

## 2024-04-02 NOTE — Anesthesia Postprocedure Evaluation (Signed)
 Anesthesia Post Note  Patient: Jacob Nixon.  Procedure(s) Performed: CYSTOSCOPY/URETEROSCOPY/HOLMIUM LASER/STENT PLACEMENT (Left)     Patient location during evaluation: PACU Anesthesia Type: General Level of consciousness: awake and alert Pain management: pain level controlled Vital Signs Assessment: post-procedure vital signs reviewed and stable Respiratory status: spontaneous breathing, nonlabored ventilation, respiratory function stable and patient connected to nasal cannula oxygen Cardiovascular status: blood pressure returned to baseline and stable Postop Assessment: no apparent nausea or vomiting Anesthetic complications: no   No notable events documented.  Last Vitals:  Vitals:   04/02/24 1415 04/02/24 1430  BP: (!) 117/51 112/71  Pulse: 94 89  Resp: 17 13  Temp:    SpO2: 95% 93%    Last Pain:  Vitals:   04/02/24 1430  TempSrc:   PainSc: Asleep                 Thom JONELLE Peoples

## 2024-04-02 NOTE — OR Nursing (Signed)
 PATIENT TOOK HIS OWN STONES HOME

## 2024-04-02 NOTE — Op Note (Signed)
 Preoperative diagnosis:  Left non-obstructing stone   Postoperative diagnosis:  same   Procedure: Left ureteroscopy, stone basket removal Left retrograde pyelogram,left ureteral stent  Surgeon: Morene MICAEL Salines, MD  Anesthesia: General  Complications: None  Intraoperative findings:  #1  Left retrograde pyelogram with 10 cc of Omnipaque  contrast was performed demonstrating a normal caliber ureter with no filling defects or abnormalities.  He had narrow calyces there were otherwise sharp with no filling defects. #2  The patient's stone was located in the lower pole posterior calyx.  I was able to grasp it with the stone basket and remove it without laser fragmentation. 3.:  A 26 cm x 6 French double-J ureteral stent was placed in the upper pole of the kidney  EBL: Minimal  Specimens: None  Indication: Jacob Nixon. is a 33 y.o. patient with left flank pain and left nonobstructing stone.  Earlier today we are unable to locate the stone with x-ray and thus unable to perform shockwave lithotripsy.  We discussed alternative options and he opted to proceed with ureteroscopy.  After reviewing the management options for treatment, he elected to proceed with the above surgical procedure(s). We have discussed the potential benefits and risks of the procedure, side effects of the proposed treatment, the likelihood of the patient achieving the goals of the procedure, and any potential problems that might occur during the procedure or recuperation. Informed consent has been obtained.  Description of procedure:  Consent was obtained in the preoperative holding area.  Was brought back to the operating room placed on table in supine position.  General esthesia was then induced endotracheal tube was inserted.  He was placed in dorsolithotomy position and prepped and draped in the routine sterile fashion.  Timeout subsequently performed.  21 French 3 degree cystoscope was gently passed through the  patient's urethra and into the bladder under visual guidance.  An open-ended catheter was advanced up into the patient's left distal ureter and a retrograde pyelogram was performed with 10 cc of Omnipaque  contrast with the above findings.  Subsequently advanced a wire up through the open-ended catheter and remove the catheter over the wire.  I then advanced the dual-lumen catheter over the safety wire and into the proximal ureter advancing a second wire up into the renal pelvis.  I then removed the catheter over the wire and exchanged it for a flexible ureteroscope.  I was able to easily advance this up into the proximal ureter.  I removed the wire and then under visual guidance entered into the patient's kidney.  He did have a very narrow UPJ, but I was able to get through it with some gentle pressure.  I was unable to locate the patient's stone in the lower pole, requiring maximal flux of the ureteroscope.  Fortunately, I was able to push a basket through and grabbed the stone.  I removed it into the renal pelvis and then regrasp it in a more firm position and was able to pull it through the ureter without laser fragmentation.  I then removed the ureteroscope and exchanged it for the dual-lumen catheter again and performed another retrograde pyelogram to help assist with stent placement.  I then backloaded the cystoscope over the wire and slowly advance it into the patient's urethra under visual guidance.  I then advanced the 26 cm time 6 French double-J stent over the wire using the Seldinger technique and positioned in the patient's upper pole under fluoroscopic guidance.  Once it was noted  to be well within the renal pelvis I advanced the stent to the bladder neck before removing the wire.  I then emptied the patient's bladder and pulled the stent through the patient's urethra.  I secured it to the dorsum of his penis.  The patient was subsequently extubated and returned to the PACU in stable  condition.  Disposition: The patient be discharged home, he is being instructed to remove the stent on Monday, November 17.  He will follow-up with us  in 6 weeks with a renal u/s prior.

## 2024-04-02 NOTE — Discharge Instructions (Signed)
 DISCHARGE INSTRUCTIONS FOR KIDNEY STONE/URETERAL STENT   MEDICATIONS:  1.  Resume all your other meds from home - except do not take any extra narcotic pain meds that you may have at home.  2. Oxycodone  5mg  every 6 hours as needed.  Okay to take Ibuprofen  or Tylenol  over the counter as needed for mild to moderate pain.   ACTIVITY:  1. No strenuous activity x 1week  2. No driving while on narcotic pain medications  3. Drink plenty of water  4. Continue to walk at home - you can still get blood clots when you are at home, so keep active, but don't over do it.  5. May return to work/school tomorrow or when you feel ready   BATHING:  1. You can shower and we recommend daily showers  2. You have a string coming from your urethra: The stent string is attached to your ureteral stent. Do not pull on this.   SIGNS/SYMPTOMS TO CALL:  Please call us  if you have a fever greater than 101.5, uncontrolled nausea/vomiting, uncontrolled pain, dizziness, unable to urinate, bloody urine, chest pain, shortness of breath, leg swelling, leg pain, redness around wound, drainage from wound, or any other concerns or questions.   You can reach us  at [phone].   FOLLOW-UP:  1. You have an appointment in 6 weeks with a ultrasound of your kidneys prior.   2. You have a string attached to your stent, you may remove it on Monday, November 7th.  To do this, pull the strings until the stents are completely removed. You may feel an odd sensation in your back.

## 2024-04-02 NOTE — Transfer of Care (Signed)
 Immediate Anesthesia Transfer of Care Note  Patient: Jacob Nixon.  Procedure(s) Performed: CYSTOSCOPY/URETEROSCOPY/HOLMIUM LASER/STENT PLACEMENT (Left)  Patient Location: PACU  Anesthesia Type:General  Level of Consciousness: sedated  Airway & Oxygen Therapy: Patient Spontanous Breathing and Patient connected to face mask oxygen  Post-op Assessment: Report given to RN and Post -op Vital signs reviewed and stable  Post vital signs: Reviewed and stable  Last Vitals:  Vitals Value Taken Time  BP 179/163 04/02/24 12:28  Temp 36.4 C 04/02/24 12:29  Pulse 77 04/02/24 12:30  Resp 18 04/02/24 12:30  SpO2 100 % 04/02/24 12:30  Vitals shown include unfiled device data.  Last Pain:  Vitals:   04/02/24 0746  TempSrc: Oral  PainSc: 4       Patients Stated Pain Goal: 0 (04/02/24 0746)  Complications: No notable events documented.

## 2024-04-02 NOTE — Anesthesia Preprocedure Evaluation (Signed)
 Anesthesia Evaluation  Patient identified by MRN, date of birth, ID band Patient awake    Reviewed: Allergy & Precautions, H&P , NPO status , Patient's Chart, lab work & pertinent test results  History of Anesthesia Complications Negative for: history of anesthetic complications  Airway Mallampati: II  TM Distance: >3 FB Neck ROM: Full    Dental no notable dental hx.    Pulmonary neg pulmonary ROS, neg COPD, former smoker   Pulmonary exam normal breath sounds clear to auscultation       Cardiovascular negative cardio ROS Normal cardiovascular exam Rhythm:Regular Rate:Normal     Neuro/Psych neg Seizures negative neurological ROS  negative psych ROS   GI/Hepatic negative GI ROS, Neg liver ROS,,,  Endo/Other  negative endocrine ROS    Renal/GU Renal diseaseLEFT URETERAL STONE  negative genitourinary   Musculoskeletal negative musculoskeletal ROS (+)    Abdominal   Peds negative pediatric ROS (+)  Hematology negative hematology ROS (+)   Anesthesia Other Findings   Reproductive/Obstetrics negative OB ROS                              Anesthesia Physical Anesthesia Plan  ASA: 2  Anesthesia Plan: General   Post-op Pain Management:    Induction: Intravenous  PONV Risk Score and Plan: 2 and Ondansetron , Dexamethasone  and Treatment may vary due to age or medical condition  Airway Management Planned: LMA  Additional Equipment: None  Intra-op Plan:   Post-operative Plan: Extubation in OR  Informed Consent: I have reviewed the patients History and Physical, chart, labs and discussed the procedure including the risks, benefits and alternatives for the proposed anesthesia with the patient or authorized representative who has indicated his/her understanding and acceptance.     Dental advisory given  Plan Discussed with: CRNA  Anesthesia Plan Comments:         Anesthesia  Quick Evaluation

## 2024-04-02 NOTE — Interval H&P Note (Signed)
 History and Physical Interval Note:  04/02/2024 9:57 AM  Jacob Nixon.  has presented today for surgery, with the diagnosis of LEFT URETERAL STONE.  The various methods of treatment have been discussed with the patient and family. After consideration of risks, benefits and other options for treatment, the patient has consented to  Procedure(s): CYSTOSCOPY/URETEROSCOPY/HOLMIUM LASER/STENT PLACEMENT (Left) as a surgical intervention.  The patient's history has been reviewed, patient examined, no change in status, stable for surgery.  I have reviewed the patient's chart and labs.  Questions were answered to the patient's satisfaction.     Jacob Nixon

## 2024-04-02 NOTE — Anesthesia Procedure Notes (Signed)
 Procedure Name: LMA Insertion Date/Time: 04/02/2024 11:32 AM  Performed by: Carleton Garnette SAUNDERS, CRNAPre-anesthesia Checklist: Patient identified, Emergency Drugs available, Suction available, Patient being monitored and Timeout performed Patient Re-evaluated:Patient Re-evaluated prior to induction Oxygen Delivery Method: Circle system utilized Preoxygenation: Pre-oxygenation with 100% oxygen Induction Type: IV induction LMA: LMA inserted LMA Size: 5.0 Tube type: Oral Number of attempts: 1 Placement Confirmation: positive ETCO2 and breath sounds checked- equal and bilateral Tube secured with: Tape Dental Injury: Teeth and Oropharynx as per pre-operative assessment

## 2024-04-03 ENCOUNTER — Encounter (HOSPITAL_COMMUNITY): Payer: Self-pay | Admitting: Urology

## 2024-04-05 ENCOUNTER — Other Ambulatory Visit: Payer: Self-pay

## 2024-04-05 ENCOUNTER — Emergency Department (HOSPITAL_BASED_OUTPATIENT_CLINIC_OR_DEPARTMENT_OTHER)

## 2024-04-05 ENCOUNTER — Inpatient Hospital Stay (HOSPITAL_BASED_OUTPATIENT_CLINIC_OR_DEPARTMENT_OTHER)
Admission: EM | Admit: 2024-04-05 | Discharge: 2024-04-07 | DRG: 699 | Disposition: A | Discharge status: Home / Self Care | Source: Ambulatory Visit | Attending: Emergency Medicine | Admitting: Emergency Medicine

## 2024-04-05 DIAGNOSIS — Z880 Allergy status to penicillin: Secondary | ICD-10-CM

## 2024-04-05 DIAGNOSIS — Z885 Allergy status to narcotic agent status: Secondary | ICD-10-CM

## 2024-04-05 DIAGNOSIS — E66812 Obesity, class 2: Secondary | ICD-10-CM | POA: Diagnosis present

## 2024-04-05 DIAGNOSIS — N390 Urinary tract infection, site not specified: Principal | ICD-10-CM | POA: Diagnosis present

## 2024-04-05 DIAGNOSIS — N136 Pyonephrosis: Principal | ICD-10-CM | POA: Diagnosis present

## 2024-04-05 DIAGNOSIS — Z6837 Body mass index (BMI) 37.0-37.9, adult: Secondary | ICD-10-CM

## 2024-04-05 DIAGNOSIS — T83593A Infection and inflammatory reaction due to other urinary stents, initial encounter: Principal | ICD-10-CM | POA: Diagnosis present

## 2024-04-05 DIAGNOSIS — F419 Anxiety disorder, unspecified: Secondary | ICD-10-CM | POA: Diagnosis present

## 2024-04-05 DIAGNOSIS — R52 Pain, unspecified: Secondary | ICD-10-CM | POA: Diagnosis not present

## 2024-04-05 DIAGNOSIS — Z87891 Personal history of nicotine dependence: Secondary | ICD-10-CM

## 2024-04-05 DIAGNOSIS — Z91018 Allergy to other foods: Secondary | ICD-10-CM

## 2024-04-05 DIAGNOSIS — Z91040 Latex allergy status: Secondary | ICD-10-CM

## 2024-04-05 DIAGNOSIS — E860 Dehydration: Secondary | ICD-10-CM | POA: Diagnosis present

## 2024-04-05 DIAGNOSIS — Z8249 Family history of ischemic heart disease and other diseases of the circulatory system: Secondary | ICD-10-CM

## 2024-04-05 DIAGNOSIS — Z79899 Other long term (current) drug therapy: Secondary | ICD-10-CM

## 2024-04-05 DIAGNOSIS — Z833 Family history of diabetes mellitus: Secondary | ICD-10-CM

## 2024-04-05 LAB — COMPREHENSIVE METABOLIC PANEL WITH GFR
ALT: 34 U/L (ref 0–44)
AST: 24 U/L (ref 15–41)
Albumin: 3.8 g/dL (ref 3.5–5.0)
Alkaline Phosphatase: 103 U/L (ref 38–126)
Anion gap: 9 (ref 5–15)
BUN: 11 mg/dL (ref 6–20)
CO2: 27 mmol/L (ref 22–32)
Calcium: 9.7 mg/dL (ref 8.9–10.3)
Chloride: 102 mmol/L (ref 98–111)
Creatinine, Ser: 0.93 mg/dL (ref 0.61–1.24)
GFR, Estimated: >60 mL/min (ref >60)
Glucose, Bld: 106 mg/dL — ABNORMAL HIGH (ref 70–99)
Potassium: 3.8 mmol/L (ref 3.5–5.1)
Sodium: 138 mmol/L (ref 135–145)
Total Bilirubin: 0.5 mg/dL (ref 0.0–1.2)
Total Protein: 7.6 g/dL (ref 6.5–8.1)

## 2024-04-05 LAB — CBC WITH DIFFERENTIAL/PLATELET
Abs Immature Granulocytes: 0.02 K/uL (ref 0.00–0.07)
Basophils Absolute: 0 K/uL (ref 0.0–0.1)
Basophils Relative: 0 %
Eosinophils Absolute: 0.2 K/uL (ref 0.0–0.5)
Eosinophils Relative: 2 %
HCT: 46.9 % (ref 39.0–52.0)
Hemoglobin: 15.9 g/dL (ref 13.0–17.0)
Immature Granulocytes: 0 %
Lymphocytes Relative: 32 %
Lymphs Abs: 3.3 K/uL (ref 0.7–4.0)
MCH: 29.4 pg (ref 26.0–34.0)
MCHC: 33.9 g/dL (ref 30.0–36.0)
MCV: 86.7 fL (ref 80.0–100.0)
Monocytes Absolute: 0.8 K/uL (ref 0.1–1.0)
Monocytes Relative: 8 %
Neutro Abs: 6 K/uL (ref 1.7–7.7)
Neutrophils Relative %: 58 %
Platelets: 305 K/uL (ref 150–400)
RBC: 5.41 MIL/uL (ref 4.22–5.81)
RDW: 13.7 % (ref 11.5–15.5)
WBC: 10.4 K/uL (ref 4.0–10.5)
nRBC: 0 % (ref 0.0–0.2)

## 2024-04-05 LAB — LACTIC ACID, PLASMA: Lactic Acid, Venous: 1.2 mmol/L (ref 0.5–1.9)

## 2024-04-05 LAB — URINALYSIS, ROUTINE W REFLEX MICROSCOPIC
Bilirubin Urine: NEGATIVE
Glucose, UA: NEGATIVE mg/dL
Ketones, ur: NEGATIVE mg/dL
Nitrite: NEGATIVE
Protein, ur: 30 mg/dL — AB
RBC / HPF: >50 RBC/hpf (ref 0–5)
Specific Gravity, Urine: >1.046 — ABNORMAL HIGH (ref 1.005–1.030)
WBC, UA: >50 WBC/hpf (ref 0–5)
pH: 6.5 (ref 5.0–8.0)

## 2024-04-05 LAB — PROTIME-INR
INR: 1 (ref 0.8–1.2)
Prothrombin Time: 14 s (ref 11.4–15.2)

## 2024-04-05 MED ORDER — KETOROLAC TROMETHAMINE 15 MG/ML IJ SOLN
15.0000 mg | Freq: Once | INTRAMUSCULAR | Status: AC
  Administered 2024-04-05: 15 mg via INTRAVENOUS
  Filled 2024-04-05: qty 1

## 2024-04-05 MED ORDER — HYDROMORPHONE HCL 1 MG/ML IJ SOLN
1.0000 mg | Freq: Once | INTRAMUSCULAR | Status: AC
  Administered 2024-04-05: 1 mg via INTRAVENOUS
  Filled 2024-04-05: qty 1

## 2024-04-05 MED ORDER — MORPHINE SULFATE (PF) 4 MG/ML IV SOLN
4.0000 mg | Freq: Once | INTRAVENOUS | Status: AC
  Administered 2024-04-05: 4 mg via INTRAVENOUS
  Filled 2024-04-05: qty 1

## 2024-04-05 MED ORDER — ONDANSETRON HCL 4 MG/2ML IJ SOLN
4.0000 mg | Freq: Once | INTRAMUSCULAR | Status: AC
  Administered 2024-04-05: 4 mg via INTRAVENOUS
  Filled 2024-04-05: qty 2

## 2024-04-05 MED ORDER — HYDROMORPHONE HCL 1 MG/ML IJ SOLN: 1.0000 mg | Freq: Once | INTRAMUSCULAR | Status: DC

## 2024-04-05 MED ORDER — IOHEXOL 350 MG/ML SOLN
100.0000 mL | Freq: Once | INTRAVENOUS | Status: AC | PRN
  Administered 2024-04-05: 100 mL via INTRAVENOUS

## 2024-04-05 MED ORDER — HYDROMORPHONE HCL 1 MG/ML IJ SOLN
1.0000 mg | INTRAMUSCULAR | Status: DC | PRN
  Administered 2024-04-05: 1 mg via INTRAVENOUS
  Filled 2024-04-05: qty 1

## 2024-04-05 MED ORDER — SODIUM CHLORIDE 0.9 % IV SOLN
1.0000 g | Freq: Once | INTRAVENOUS | Status: AC
  Administered 2024-04-05: 1 g via INTRAVENOUS
  Filled 2024-04-05: qty 10

## 2024-04-05 NOTE — Progress Notes (Signed)
 Plan of Care Note for accepted transfer   Patient: Jacob Nixon. MRN: [id number]   DOA: 04/05/2024  Facility requesting transfer: [facility]  Requesting Provider: Marry Kidney, PA   Reason for transfer: Intractable left flank pain  Facility course: 33 yr old man with GAD and nephrolithiasis s/p stone removal and left ureteral stent placement on 04/02/24 who presents with increased left flank pain and hematuria.   He is afebrile with stable creatinine and normal WBC.   CT reveals stent in the left ureter with mild hydronephrosis and periureteral stranding.   Pain-control has been difficult. He has been given Toradol , morphine  x2, and Dilaudid .   Urology was consulted by the ED PA and agreed with admission for pain-control.   Plan of care: The patient is accepted for admission to Med-surg  unit, at [facility].   Author: Evalene GORMAN Sprinkles, MD 04/05/2024  Check [email or web address] for on-call coverage.  Nursing staff, Please call [facility] Admits & Consults System-Wide number on Amion as soon as patient's arrival, so appropriate admitting provider can evaluate the pt.

## 2024-04-05 NOTE — ED Provider Notes (Signed)
 [place]GENCY DEPARTMENT AT [facility] Provider Note   CSN: [id number] Arrival date & time: 04/05/24  1516     Patient presents with: Post-op Problem (Urinary stent)   Jacob Lona. is a 33 y.o. male with history of carpal tunnel and kidney stones, who presents the emergency department for evaluation of worsening pain and bleeding after stent placement on Friday.  Patient underwent cystoscopy/ureteroscopy with stent placement on 11/14 and was informed he would likely have bleeding for the first 1-2 days after the procedure.  Patient reported hematuria stopped on Saturday, but then resumed yesterday.  He also reports significant left flank pain and tenderness that started at 2 AM and has worsened since.  He has been taking the Percocet  he was prescribed for pain management, but states it is not working.  Patient was supposed to pull his stent this morning, but per patient states he did not remove it.  He does report passing multiple large clots in his urine prior to arrival.  Patient did speak with urology this morning and was advised to come to the emergency department.  HPI     Prior to Admission medications   Medication Sig Start Date End Date Taking? Authorizing Provider  fexofenadine (ALLEGRA) 180 MG tablet Take 180 mg by mouth daily as needed for allergies or rhinitis.    [provider]  ketorolac  (TORADOL ) 10 MG tablet Take 1 tablet (10 mg total) by mouth every 6 (six) hours as needed. 12/16/23   Prosperi, Christian H, PA-C  ondansetron  (ZOFRAN ) 4 MG tablet Take 1 tablet (4 mg total) by mouth every 8 (eight) hours as needed for nausea or vomiting. 01/28/24   Bauer, Collin S, PA-C  oxyCODONE  (ROXICODONE ) 5 MG immediate release tablet Take 1 tablet (5 mg total) by mouth every 8 (eight) hours as needed for up to 5 doses for severe pain (pain score 7-10). 04/02/24   Cam Morene ORN, MD  tamsulosin  (FLOMAX ) 0.4 MG CAPS capsule Take 1 capsule (0.4 mg total)  by mouth daily. Until stone passes 04/02/24   Cam Morene ORN, MD    Allergies: Fentanyl , Coconut flavoring agent (non-screening), Penicillins, and Latex    Review of Systems  Genitourinary:  Positive for flank pain and hematuria.    Updated Vital Signs BP 126/75   Pulse 94   Temp 98.1 F (36.7 C) (Oral)   Resp 16   SpO2 98%   Physical Exam Vitals and nursing note reviewed. Exam conducted with a chaperone present.  Constitutional:      Appearance: Normal appearance.  HENT:     Head: Normocephalic and atraumatic.     Mouth/Throat:     Mouth: Mucous membranes are moist.  Eyes:     General: No scleral icterus.       Right eye: No discharge.        Left eye: No discharge.     Conjunctiva/sclera: Conjunctivae normal.  Cardiovascular:     Rate and Rhythm: Normal rate and regular rhythm.     Pulses: Normal pulses.  Pulmonary:     Effort: Pulmonary effort is normal.     Breath sounds: Normal breath sounds.  Abdominal:     General: There is no distension.     Tenderness: There is no abdominal tenderness. There is left CVA tenderness.     Comments: Significant left CVA tenderness to palpation and with movement.  Genitourinary:    Penis: Normal.      Comments: No obivous swelling  at urethral head. I am unable to visualize stent placement on physical exam. Musculoskeletal:        General: No deformity.     Cervical back: Normal range of motion.  Skin:    General: Skin is warm and dry.     Capillary Refill: Capillary refill takes less than 2 seconds.  Neurological:     Mental Status: He is alert.     Motor: No weakness.  Psychiatric:        Mood and Affect: Mood normal.     (all labs ordered are listed, but only abnormal results are displayed) Labs Reviewed  COMPREHENSIVE METABOLIC PANEL WITH GFR - Abnormal; Notable for the following components:      Result Value   Glucose, Bld 106 (*)    All other components within normal limits  URINALYSIS, ROUTINE W REFLEX  MICROSCOPIC - Abnormal; Notable for the following components:   Color, Urine ORANGE (*)    APPearance HAZY (*)    Specific Gravity, Urine >1.046 (*)    Hgb urine dipstick LARGE (*)    Protein, ur 30 (*)    Leukocytes,Ua MODERATE (*)    Bacteria, UA MANY (*)    All other components within normal limits  PROTIME-INR  CBC WITH DIFFERENTIAL/PLATELET  LACTIC ACID, PLASMA    EKG: None  Radiology: CT ABDOMEN PELVIS W CONTRAST Result Date: 04/05/2024 CLINICAL DATA:  Recent renal stone removal and stent placement presenting with subsequent gross hematuria. EXAM: CT ABDOMEN AND PELVIS WITH CONTRAST TECHNIQUE: Multidetector CT imaging of the abdomen and pelvis was performed using the standard protocol following bolus administration of intravenous contrast. RADIATION DOSE REDUCTION: This exam was performed according to the departmental dose-optimization program which includes automated exposure control, adjustment of the mA and/or kV according to patient size and/or use of iterative reconstruction technique. CONTRAST:  OMNIPAQUE  IOHEXOL  350 MG/ML SOLN COMPARISON:  March 25, 2024 FINDINGS: Lower chest: No acute abnormality. Hepatobiliary: No focal liver abnormality is seen. No gallstones, gallbladder wall thickening, or biliary dilatation. Pancreas: Unremarkable. No pancreatic ductal dilatation or surrounding inflammatory changes. Spleen: Normal in size without focal abnormality. Adrenals/Urinary Tract: Adrenal glands are unremarkable. Kidneys are normal in size. Small bilateral subcentimeter simple renal cysts are seen. A left-sided endo ureteral stent is in place with mild left-sided hydronephrosis and mild periureteral inflammatory fat stranding. Small amount of air is seen within the upper pole calyx of the left kidney. Bladder is unremarkable. Stomach/Bowel: Stomach is within normal limits. Appendix appears normal. No evidence of bowel wall thickening, distention, or inflammatory changes.  Vascular/Lymphatic: No significant vascular findings are present. No enlarged abdominal or pelvic lymph nodes. Reproductive: Prostate is unremarkable. Other: There is a small, stable fat containing left inguinal hernia. No abdominopelvic ascites. Musculoskeletal: No acute or significant osseous findings. IMPRESSION: 1. Left-sided endo ureteral stent in place with mild left-sided hydronephrosis and mild periureteral inflammatory fat stranding. Sequelae associated with acute pyelonephritis cannot be excluded. 2. Small amount of air within the upper pole calyx of the left kidney, likely postprocedural. Electronically Signed   By: Suzen Dials M.D.   On: 04/05/2024 18:14     Procedures   Medications Ordered in the ED  morphine  (PF) 4 MG/ML injection 4 mg (4 mg Intravenous Given 04/05/24 1621)  ondansetron  (ZOFRAN ) injection 4 mg (4 mg Intravenous Given 04/05/24 1629)  iohexol  (OMNIPAQUE ) 350 MG/ML injection 100 mL (100 mLs Intravenous Contrast Given 04/05/24 1705)  morphine  (PF) 4 MG/ML injection 4 mg (4 mg Intravenous  Given 04/05/24 1739)  cefTRIAXone  (ROCEPHIN ) 1 g in sodium chloride  0.9 % 100 mL IVPB (0 g Intravenous Stopped 04/05/24 2004)  ketorolac  (TORADOL ) 15 MG/ML injection 15 mg (15 mg Intravenous Given 04/05/24 1926)  HYDROmorphone  (DILAUDID ) injection 1 mg (1 mg Intravenous Given 04/05/24 2101)     Medical Decision Making Amount and/or Complexity of Data Reviewed Labs: ordered. Radiology: ordered.  Risk Prescription drug management. Decision regarding hospitalization.   This patient presents to the ED for concern of hematuria and flank pain after ureteroscopy, this involves an extensive number of treatment options, and is a complaint that carries with it a high risk of complications and morbidity.  Differential diagnosis includes: Postprocedural hematuria, ureteral edema, transient obstruction, residual stone fragment, ureteral perforation, UTI, urosepsis  Co morbidities:   none   Additional history: Per chart review, patient underwent left ureteroscopy and left retrograde pyelogram and stent placement with Dr. Cam on 11/14.  Per op note, stone was successfully removed and stent was placed without difficulty.  Patient was given discharge instructions to remove the stent on 11/17.  Lab Tests:  I Ordered, and personally interpreted labs.  The pertinent results include: Urine consistent with infection, moderate leukocytes  Imaging Studies:  I ordered imaging studies including CT abdomen pelvis I independently visualized and interpreted imaging which showed   1. Left-sided endo ureteral stent in place with mild left-sided  hydronephrosis and mild periureteral inflammatory fat stranding.  Sequelae associated with acute pyelonephritis cannot be excluded.  2. Small amount of air within the upper pole calyx of the left  kidney, likely postprocedural.   I agree with the radiologist interpretation  Cardiac Monitoring/ECG:  The patient was maintained on a cardiac monitor.  I personally viewed and interpreted the cardiac monitored which showed an underlying rhythm of: Sinus tachycardia  Medicines ordered and prescription drug management:  I ordered medication including  Medications  morphine  (PF) 4 MG/ML injection 4 mg (4 mg Intravenous Given 04/05/24 1621)  ondansetron  (ZOFRAN ) injection 4 mg (4 mg Intravenous Given 04/05/24 1629)  iohexol  (OMNIPAQUE ) 350 MG/ML injection 100 mL (100 mLs Intravenous Contrast Given 04/05/24 1705)  morphine  (PF) 4 MG/ML injection 4 mg (4 mg Intravenous Given 04/05/24 1739)  cefTRIAXone  (ROCEPHIN ) 1 g in sodium chloride  0.9 % 100 mL IVPB (0 g Intravenous Stopped 04/05/24 2004)  ketorolac  (TORADOL ) 15 MG/ML injection 15 mg (15 mg Intravenous Given 04/05/24 1926)  HYDROmorphone  (DILAUDID ) injection 1 mg (1 mg Intravenous Given 04/05/24 2101)   for pain management Reevaluation of the patient after these medicines showed that  the patient improved I have reviewed the patients home medicines and have made adjustments as needed  Test Considered:   none  Critical Interventions:   none  Consultations Obtained: -Urology  Problem List / ED Course:     ICD-10-CM   1. Urinary tract infection with hematuria, site unspecified  N39.0    R31.9       MDM: 32 year old male who presents emergency department for evaluation of hematuria and left flank pain after ureteroscopy on 11/14.  Patient had multiple stones removed and then had a stent placement during the procedure.  Per chart review, stent was secured to the dorsum of patient's penis.  Patient's discharge instructions included stent removal today, 11/17 and surgical follow-up in 6 weeks.  However, patient was awoken from his sleep at 2 AM with significant flank pain that is unrelieved by pain medication.  Patient also reports multiple episodes of hematuria, this morning with the passage  of multiple clots.  He is in significant pain and distress at this time.  Patient's left flank is exquisitely tender.  CT abdomen pelvis shows mild hydro nephritis and mild periureteral inflammation with fat stranding that is associated with acute pyelonephritis..  Labs are unremarkable.  No leukocytosis.  UA shows moderate leukocytes, consistent with UTI.  IV Rocephin  started.  Consult to urology has been placed.  I spoke with Dr. Norva, the on-call resident who felt this was more of a pain control issue and less a urology issue.  They did agree to consult for this patient.  Plan to admit this patient to medicine.  At this time, handoff has been given to Francis Fleeting PA-C who will follow-up on patient's care until she speaks with the hospitalist.  Additionally, I have given the patient multiple rounds of IV morphine , Toradol , IV Dilaudid  and he is still in significant amount of pain.   Dispostion:  After consideration of the diagnostic results and the patients response to treatment, I  feel that the patient would benefit from hospital admission for pain control.   Final diagnoses:  Urinary tract infection with hematuria, site unspecified    ED Discharge Orders     None          Torrence Marry GORMAN DEVONNA 04/05/24 2238    Bernard Drivers, MD 04/05/24 [id number]

## 2024-04-05 NOTE — ED Provider Notes (Signed)
 Consulted hospitalist, Dr. Charlton who is agreeable to admission for pain control and IV antibiotics.   Francis Ileana SAILOR, PA-C 04/05/24 2234    Bernard Drivers, MD 04/05/24 2300

## 2024-04-05 NOTE — ED Notes (Signed)
 Patient requesting additional pain medication after transferring to and from CT table. Will ask provider.

## 2024-04-05 NOTE — ED Triage Notes (Signed)
 Friday renal stones removed with stent placement. Blood cleared up Saturday. Today pain has emerged with blood and large blood clots. Unsure if voiding completely.

## 2024-04-05 NOTE — ED Notes (Signed)
 ED Provider at bedside.

## 2024-04-06 ENCOUNTER — Encounter (HOSPITAL_COMMUNITY): Payer: Self-pay | Admitting: Family Medicine

## 2024-04-06 DIAGNOSIS — N136 Pyonephrosis: Secondary | ICD-10-CM | POA: Diagnosis present

## 2024-04-06 DIAGNOSIS — N390 Urinary tract infection, site not specified: Secondary | ICD-10-CM | POA: Diagnosis not present

## 2024-04-06 DIAGNOSIS — Z91018 Allergy to other foods: Secondary | ICD-10-CM | POA: Diagnosis not present

## 2024-04-06 DIAGNOSIS — R52 Pain, unspecified: Secondary | ICD-10-CM | POA: Diagnosis present

## 2024-04-06 DIAGNOSIS — Z833 Family history of diabetes mellitus: Secondary | ICD-10-CM | POA: Diagnosis not present

## 2024-04-06 DIAGNOSIS — Z79899 Other long term (current) drug therapy: Secondary | ICD-10-CM | POA: Diagnosis not present

## 2024-04-06 DIAGNOSIS — E66812 Obesity, class 2: Secondary | ICD-10-CM | POA: Diagnosis present

## 2024-04-06 DIAGNOSIS — Z91040 Latex allergy status: Secondary | ICD-10-CM | POA: Diagnosis not present

## 2024-04-06 DIAGNOSIS — Z885 Allergy status to narcotic agent status: Secondary | ICD-10-CM | POA: Diagnosis not present

## 2024-04-06 DIAGNOSIS — Z880 Allergy status to penicillin: Secondary | ICD-10-CM | POA: Diagnosis not present

## 2024-04-06 DIAGNOSIS — Z8249 Family history of ischemic heart disease and other diseases of the circulatory system: Secondary | ICD-10-CM | POA: Diagnosis not present

## 2024-04-06 DIAGNOSIS — Z87891 Personal history of nicotine dependence: Secondary | ICD-10-CM | POA: Diagnosis not present

## 2024-04-06 DIAGNOSIS — T83593A Infection and inflammatory reaction due to other urinary stents, initial encounter: Secondary | ICD-10-CM | POA: Diagnosis present

## 2024-04-06 DIAGNOSIS — F419 Anxiety disorder, unspecified: Secondary | ICD-10-CM | POA: Diagnosis present

## 2024-04-06 DIAGNOSIS — Z6837 Body mass index (BMI) 37.0-37.9, adult: Secondary | ICD-10-CM | POA: Diagnosis not present

## 2024-04-06 DIAGNOSIS — E860 Dehydration: Secondary | ICD-10-CM | POA: Diagnosis present

## 2024-04-06 LAB — BASIC METABOLIC PANEL WITH GFR
Anion gap: 9 (ref 5–15)
BUN: 11 mg/dL (ref 6–20)
CO2: 26 mmol/L (ref 22–32)
Calcium: 8.9 mg/dL (ref 8.9–10.3)
Chloride: 102 mmol/L (ref 98–111)
Creatinine, Ser: 0.95 mg/dL (ref 0.61–1.24)
GFR, Estimated: >60 mL/min (ref >60)
Glucose, Bld: 90 mg/dL (ref 70–99)
Potassium: 3.8 mmol/L (ref 3.5–5.1)
Sodium: 137 mmol/L (ref 135–145)

## 2024-04-06 LAB — CBC
HCT: 46.5 % (ref 39.0–52.0)
Hemoglobin: 15.5 g/dL (ref 13.0–17.0)
MCH: 29.4 pg (ref 26.0–34.0)
MCHC: 33.3 g/dL (ref 30.0–36.0)
MCV: 88.1 fL (ref 80.0–100.0)
Platelets: 291 K/uL (ref 150–400)
RBC: 5.28 MIL/uL (ref 4.22–5.81)
RDW: 14 % (ref 11.5–15.5)
WBC: 8.8 K/uL (ref 4.0–10.5)
nRBC: 0 % (ref 0.0–0.2)

## 2024-04-06 LAB — MAGNESIUM: Magnesium: 2.2 mg/dL (ref 1.7–2.4)

## 2024-04-06 LAB — GLUCOSE, CAPILLARY: Glucose-Capillary: 103 mg/dL — ABNORMAL HIGH (ref 70–99)

## 2024-04-06 MED ORDER — HYDROMORPHONE HCL 1 MG/ML IJ SOLN
0.5000 mg | INTRAMUSCULAR | Status: DC | PRN
  Administered 2024-04-06 (×4): 0.5 mg via INTRAVENOUS
  Filled 2024-04-06 (×4): qty 0.5

## 2024-04-06 MED ORDER — ACETAMINOPHEN 325 MG PO TABS
650.0000 mg | ORAL_TABLET | Freq: Four times a day (QID) | ORAL | Status: DC | PRN
  Administered 2024-04-07: 650 mg via ORAL
  Filled 2024-04-06: qty 2

## 2024-04-06 MED ORDER — HYDROMORPHONE HCL 1 MG/ML IJ SOLN
0.5000 mg | INTRAMUSCULAR | Status: DC | PRN
  Administered 2024-04-06 – 2024-04-07 (×4): 0.5 mg via INTRAVENOUS
  Filled 2024-04-06 (×4): qty 0.5

## 2024-04-06 MED ORDER — OXYCODONE HCL 5 MG PO TABS
5.0000 mg | ORAL_TABLET | ORAL | Status: DC | PRN
  Administered 2024-04-06 – 2024-04-07 (×4): 10 mg via ORAL
  Filled 2024-04-06 (×4): qty 2

## 2024-04-06 MED ORDER — LACTATED RINGERS IV BOLUS
1000.0000 mL | Freq: Once | INTRAVENOUS | Status: AC
  Administered 2024-04-06: 1000 mL via INTRAVENOUS

## 2024-04-06 MED ORDER — HYDROMORPHONE HCL 1 MG/ML IJ SOLN
0.2500 mg | INTRAMUSCULAR | Status: DC | PRN
  Administered 2024-04-06: 0.25 mg via INTRAVENOUS
  Filled 2024-04-06: qty 0.5

## 2024-04-06 MED ORDER — HYDROMORPHONE HCL 1 MG/ML IJ SOLN
0.5000 mg | Freq: Once | INTRAMUSCULAR | Status: AC
  Administered 2024-04-06: 0.5 mg via INTRAVENOUS
  Filled 2024-04-06: qty 0.5

## 2024-04-06 MED ORDER — SODIUM CHLORIDE 0.9 % IV SOLN
2.0000 g | INTRAVENOUS | Status: DC
  Administered 2024-04-06 – 2024-04-07 (×2): 2 g via INTRAVENOUS
  Filled 2024-04-06 (×2): qty 20

## 2024-04-06 MED ORDER — SODIUM CHLORIDE 0.9 % IV SOLN: 1.0000 g | INTRAVENOUS | Status: DC

## 2024-04-06 MED ORDER — OXYCODONE HCL 5 MG PO TABS
5.0000 mg | ORAL_TABLET | ORAL | Status: DC | PRN
  Administered 2024-04-06 (×2): 5 mg via ORAL
  Filled 2024-04-06 (×2): qty 1

## 2024-04-06 MED ORDER — KETOROLAC TROMETHAMINE 15 MG/ML IJ SOLN
15.0000 mg | Freq: Four times a day (QID) | INTRAMUSCULAR | Status: DC | PRN
  Administered 2024-04-06: 15 mg via INTRAVENOUS
  Filled 2024-04-06: qty 1

## 2024-04-06 MED ORDER — LACTATED RINGERS IV SOLN: INTRAVENOUS | Status: DC

## 2024-04-06 MED ORDER — SENNOSIDES-DOCUSATE SODIUM 8.6-50 MG PO TABS: 1.0000 | ORAL_TABLET | Freq: Every evening | ORAL | Status: DC | PRN

## 2024-04-06 MED ORDER — SODIUM CHLORIDE 0.9% FLUSH
3.0000 mL | Freq: Two times a day (BID) | INTRAVENOUS | Status: DC
  Administered 2024-04-06 – 2024-04-07 (×4): 3 mL via INTRAVENOUS

## 2024-04-06 MED ORDER — ACETAMINOPHEN 650 MG RE SUPP: 650.0000 mg | Freq: Four times a day (QID) | RECTAL | Status: DC | PRN

## 2024-04-06 MED ORDER — HYDROMORPHONE HCL 2 MG PO TABS
2.0000 mg | ORAL_TABLET | ORAL | Status: DC | PRN
  Administered 2024-04-06 (×2): 2 mg via ORAL
  Filled 2024-04-06 (×2): qty 1

## 2024-04-06 NOTE — Progress Notes (Signed)
   04/06/24 0929  TOC Brief Assessment  Insurance and Status Reviewed  Patient has primary care physician Yes  Home environment has been reviewed Apartment  Prior level of function: Independent  Prior/Current Home Services No current home services  Social Drivers of Health Review SDOH reviewed no interventions necessary  Readmission risk has been reviewed Yes  Transition of care needs no transition of care needs at this time    Signed: Heather Saltness, MSW, LCSW Clinical Social Worker Inpatient Care Management 04/06/2024 9:30 AM

## 2024-04-06 NOTE — Plan of Care (Signed)
  Problem: Education: Goal: Knowledge of General Education information will improve Description: Including pain rating scale, medication(s)/side effects and non-pharmacologic comfort measures Outcome: Progressing   Problem: Health Behavior/Discharge Planning: Goal: Ability to manage health-related needs will improve Outcome: Progressing   Problem: Clinical Measurements: Goal: Ability to maintain clinical measurements within normal limits will improve Outcome: Progressing Goal: Will remain free from infection Outcome: Progressing Goal: Respiratory complications will improve Outcome: Progressing   Problem: Clinical Measurements: Goal: Diagnostic test results will improve Outcome: Not Progressing

## 2024-04-06 NOTE — Consult Note (Signed)
 Urology Consult   Physician requesting consult: Lonni Dalton, MD  Reason for consult: flank pain, possible UTI after ureteroscopy  History of Present Illness: Jacob Nixon. is a 33 y.o. with PMH nephrolithiasis who underwent left ureteroscopic stone extraction with Dr. Cam on 11/14 who presented to the ED 10/17 pm with worsening left flank pain and hematuria.   Pt reports initially feeling sore after surgery but his pain acutely worsened over the weekend and he started passing small clots. He denies fevers or chills. Denies burning with urination; he describes a stabbing sensation when he voids that is felt in his bladder and penis and which also radiates to his flank. He was supposed to remove his stent at home yesterday but did not do so because of the pain.  AVFSS. Cr is baseline 0.95. WBC 10.4 on arrival. UA with moderate leukocytes, nitrite negative, >50 RBC, >50 WBC, many bacteria. CT with left stent in appropriate position with mild hydronephrosis and periureteral stranding.    Past Medical History:  Diagnosis Date   Abscess of upper arm    Bronchitis    Cellulitis of forearm, right 07/30/2015   Cellulitis of hand 08/04/2016   Cellulitis of right hand    Cellulitis of right hand - RECURRENT    Complication of anesthesia    woke up during sx one time   Kidney stones     Past Surgical History:  Procedure Laterality Date   CARPAL TUNNEL RELEASE Left 04/09/2016   Procedure: CARPAL TUNNEL RELEASE;  Surgeon: Glendia Cordella Hutchinson, MD;  Location: [facility];  Service: Orthopedics;  Laterality: Left;   CARPAL TUNNEL RELEASE Right 12/18/2022   Procedure: REVISION CARPAL TUNNEL RELEASE;  Surgeon: Romona Harari, MD;  Location: [facility];  Service: Orthopedics;  Laterality: Right;   CYSTOSCOPY WITH RETROGRADE PYELOGRAM, URETEROSCOPY AND STENT PLACEMENT Bilateral 07/02/2020   Procedure: CYSTOSCOPY WITH RETROGRADE PYELOGRAM, URETEROSCOPY AND STENT PLACEMENT  POSSIBLY BILATERAL;  Surgeon: Selma Donnice SAUNDERS, MD;  Location: [facility];  Service: Urology;  Laterality: Bilateral;   CYSTOSCOPY/URETEROSCOPY/HOLMIUM LASER/STENT PLACEMENT Bilateral 07/06/2020   Procedure: CYSTOSCOPY/URETEROSCOPY/HOLMIUM LASER/LEFT STENT EXCHANGE, RIGHT STENT PLACEMENT;  Surgeon: Penne Knee, MD;  Location: [facility];  Service: Urology;  Laterality: Bilateral;   CYSTOSCOPY/URETEROSCOPY/HOLMIUM LASER/STENT PLACEMENT Left 04/02/2024   Procedure: CYSTOSCOPY/URETEROSCOPY/HOLMIUM LASER/STENT PLACEMENT;  Surgeon: Cam Morene ORN, MD;  Location: [facility];  Service: Urology;  Laterality: Left;   EXTRACORPOREAL SHOCK WAVE LITHOTRIPSY Left 06/22/2020   Procedure: EXTRACORPOREAL SHOCK WAVE LITHOTRIPSY (ESWL);  Surgeon: Penne Knee, MD;  Location: [facility];  Service: Urology;  Laterality: Left;   HAND SURGERY     HYPOTHENAR FAT PAD TRANSFER Right 12/18/2022   Procedure: HYPOTHENAR FAT PAD FLAP;  Surgeon: Romona Harari, MD;  Location: [facility];  Service: Orthopedics;  Laterality: Right;   KIDNEY STONE SURGERY     lithortripsy    Current Hospital Medications:  Home Meds:  No current facility-administered medications on file prior to encounter.   Current Outpatient Medications on File Prior to Encounter  Medication Sig Dispense Refill   fexofenadine (ALLEGRA) 180 MG tablet Take 180 mg by mouth daily as needed for allergies or rhinitis.     ketorolac  (TORADOL ) 10 MG tablet Take 1 tablet (10 mg total) by mouth every 6 (six) hours as needed. 20 tablet 0   ondansetron  (ZOFRAN ) 4 MG tablet Take 1 tablet (4 mg total) by mouth every 8 (eight) hours as needed for nausea or vomiting.     oxyCODONE  (ROXICODONE )  5 MG immediate release tablet Take 1 tablet (5 mg total) by mouth every 8 (eight) hours as needed for up to 5 doses for severe pain (pain score 7-10). 5 tablet 0   tamsulosin  (FLOMAX ) 0.4 MG CAPS capsule Take 1 capsule (0.4 mg total) by mouth daily. Until stone passes  5 capsule 0     Scheduled Meds:  sodium chloride  flush  3 mL Intravenous Q12H   Continuous Infusions:  cefTRIAXone  (ROCEPHIN )  IV 2 g (04/06/24 0922)   lactated ringers  100 mL/hr at 04/06/24 0918   PRN Meds:.acetaminophen  **OR** acetaminophen , HYDROmorphone  (DILAUDID ) injection, HYDROmorphone , senna-docusate  Allergies:  Allergies  Allergen Reactions   Fentanyl  Other (See Comments)    Patient prefers to NOT take this   Coconut Flavoring Agent (Non-Screening) Other (See Comments)    Scratchy throat   Penicillins Swelling and Other (See Comments)    Patient had atypical chest pain with some shortness of breath after a dose of Bicillin  CR in January 2020. ED physician noted no signs of allergic reaction at the time. Since that time tolerated Zosyn  X 1 dose in the ED 08/22/21     Latex Rash    Family History  Problem Relation Age of Onset   Hypertension Other    Diabetes Father    Healthy Mother     Social History:  reports that he has quit smoking. His smoking use included cigarettes. He has never used smokeless tobacco. He reports current alcohol use. He reports that he does not use drugs.  ROS: A complete review of systems was performed.  All systems are negative except for pertinent findings as noted.  Physical Exam:  Vital signs in last 24 hours: Temp:  [97.3 F (36.3 C)-98.5 F (36.9 C)] 97.4 F (36.3 C) (11/18 0737) Pulse Rate:  [75-103] 75 (11/18 0737) Resp:  [16-24] 20 (11/18 0737) BP: (106-143)/(63-103) 117/88 (11/18 0737) SpO2:  [93 %-100 %] 99 % (11/18 0737) Weight:  [121.6 kg] 121.6 kg (11/17 2349) Constitutional:  Alert and oriented, No acute distress Cardiovascular: Regular rate and rhythm, No JVD Respiratory: Normal respiratory effort, Lungs clear bilaterally GI: Abdomen is soft, nontender, nondistended, no abdominal masses GU: No CVA tenderness Lymphatic: No lymphadenopathy Neurologic: Grossly intact, no focal deficits Psychiatric: Normal mood and  affect  Laboratory Data:  Recent Labs    04/05/24 1620 04/06/24 0709  WBC 10.4 8.8  HGB 15.9 15.5  HCT 46.9 46.5  PLT 305 291    Recent Labs    04/05/24 1620 04/06/24 0709  NA 138 137  K 3.8 3.8  CL 102 102  GLUCOSE 106* 90  BUN 11 11  CALCIUM  9.7 8.9  CREATININE 0.93 0.95     Results for orders placed or performed during the hospital encounter of 04/05/24 (from the past 24 hours)  Protime-INR     Status: None   Collection Time: 04/05/24  4:20 PM  Result Value Ref Range   Prothrombin Time 14.0 11.4 - 15.2 seconds   INR 1.0 0.8 - 1.2  CBC with Differential     Status: None   Collection Time: 04/05/24  4:20 PM  Result Value Ref Range   WBC 10.4 4.0 - 10.5 K/uL   RBC 5.41 4.22 - 5.81 MIL/uL   Hemoglobin 15.9 13.0 - 17.0 g/dL   HCT 53.0 60.9 - 47.9 %   MCV 86.7 80.0 - 100.0 fL   MCH 29.4 26.0 - 34.0 pg   MCHC 33.9 30.0 - 36.0 g/dL  RDW 13.7 11.5 - 15.5 %   Platelets 305 150 - 400 K/uL   nRBC 0.0 0.0 - 0.2 %   Neutrophils Relative % 58 %   Neutro Abs 6.0 1.7 - 7.7 K/uL   Lymphocytes Relative 32 %   Lymphs Abs 3.3 0.7 - 4.0 K/uL   Monocytes Relative 8 %   Monocytes Absolute 0.8 0.1 - 1.0 K/uL   Eosinophils Relative 2 %   Eosinophils Absolute 0.2 0.0 - 0.5 K/uL   Basophils Relative 0 %   Basophils Absolute 0.0 0.0 - 0.1 K/uL   Immature Granulocytes 0 %   Abs Immature Granulocytes 0.02 0.00 - 0.07 K/uL  Comprehensive metabolic panel     Status: Abnormal   Collection Time: 04/05/24  4:20 PM  Result Value Ref Range   Sodium 138 135 - 145 mmol/L   Potassium 3.8 3.5 - 5.1 mmol/L   Chloride 102 98 - 111 mmol/L   CO2 27 22 - 32 mmol/L   Glucose, Bld 106 (H) 70 - 99 mg/dL   BUN 11 6 - 20 mg/dL   Creatinine, Ser 9.06 0.61 - 1.24 mg/dL   Calcium  9.7 8.9 - 10.3 mg/dL   Total Protein 7.6 6.5 - 8.1 g/dL   Albumin 3.8 3.5 - 5.0 g/dL   AST 24 15 - 41 U/L   ALT 34 0 - 44 U/L   Alkaline Phosphatase 103 38 - 126 U/L   Total Bilirubin 0.5 0.0 - 1.2 mg/dL   GFR,  Estimated >39 >39 mL/min   Anion gap 9 5 - 15  Lactic acid, plasma     Status: None   Collection Time: 04/05/24  4:20 PM  Result Value Ref Range   Lactic Acid, Venous 1.2 0.5 - 1.9 mmol/L  Urinalysis, Routine w reflex microscopic -Urine, Clean Catch     Status: Abnormal   Collection Time: 04/05/24  6:51 PM  Result Value Ref Range   Color, Urine ORANGE (A) YELLOW   APPearance HAZY (A) CLEAR   Specific Gravity, Urine >1.046 (H) 1.005 - 1.030   pH 6.5 5.0 - 8.0   Glucose, UA NEGATIVE NEGATIVE mg/dL   Hgb urine dipstick LARGE (A) NEGATIVE   Bilirubin Urine NEGATIVE NEGATIVE   Ketones, ur NEGATIVE NEGATIVE mg/dL   Protein, ur 30 (A) NEGATIVE mg/dL   Nitrite NEGATIVE NEGATIVE   Leukocytes,Ua MODERATE (A) NEGATIVE   RBC / HPF >50 0 - 5 RBC/hpf   WBC, UA >50 0 - 5 WBC/hpf   Bacteria, UA MANY (A) NONE SEEN   Squamous Epithelial / HPF 0-5 0 - 5 /HPF   Mucus PRESENT   Magnesium     Status: None   Collection Time: 04/06/24  7:09 AM  Result Value Ref Range   Magnesium 2.2 1.7 - 2.4 mg/dL  Basic metabolic panel     Status: None   Collection Time: 04/06/24  7:09 AM  Result Value Ref Range   Sodium 137 135 - 145 mmol/L   Potassium 3.8 3.5 - 5.1 mmol/L   Chloride 102 98 - 111 mmol/L   CO2 26 22 - 32 mmol/L   Glucose, Bld 90 70 - 99 mg/dL   BUN 11 6 - 20 mg/dL   Creatinine, Ser 9.04 0.61 - 1.24 mg/dL   Calcium  8.9 8.9 - 10.3 mg/dL   GFR, Estimated >39 >39 mL/min   Anion gap 9 5 - 15  CBC     Status: None   Collection Time: 04/06/24  7:09  AM  Result Value Ref Range   WBC 8.8 4.0 - 10.5 K/uL   RBC 5.28 4.22 - 5.81 MIL/uL   Hemoglobin 15.5 13.0 - 17.0 g/dL   HCT 53.4 60.9 - 47.9 %   MCV 88.1 80.0 - 100.0 fL   MCH 29.4 26.0 - 34.0 pg   MCHC 33.3 30.0 - 36.0 g/dL   RDW 85.9 88.4 - 84.4 %   Platelets 291 150 - 400 K/uL   nRBC 0.0 0.0 - 0.2 %  Glucose, capillary     Status: Abnormal   Collection Time: 04/06/24  7:40 AM  Result Value Ref Range   Glucose-Capillary 103 (H) 70 - 99 mg/dL    No results found for this or any previous visit (from the past 240 hours).  Renal Function: Recent Labs    04/05/24 1620 04/06/24 0709  CREATININE 0.93 0.95   Estimated Creatinine Clearance: 145.4 mL/min (by C-G formula based on SCr of 0.95 mg/dL).  Radiologic Imaging: CT ABDOMEN PELVIS W CONTRAST Result Date: 04/05/2024 CLINICAL DATA:  Recent renal stone removal and stent placement presenting with subsequent gross hematuria. EXAM: CT ABDOMEN AND PELVIS WITH CONTRAST TECHNIQUE: Multidetector CT imaging of the abdomen and pelvis was performed using the standard protocol following bolus administration of intravenous contrast. RADIATION DOSE REDUCTION: This exam was performed according to the departmental dose-optimization program which includes automated exposure control, adjustment of the mA and/or kV according to patient size and/or use of iterative reconstruction technique. CONTRAST:  OMNIPAQUE  IOHEXOL  350 MG/ML SOLN COMPARISON:  March 25, 2024 FINDINGS: Lower chest: No acute abnormality. Hepatobiliary: No focal liver abnormality is seen. No gallstones, gallbladder wall thickening, or biliary dilatation. Pancreas: Unremarkable. No pancreatic ductal dilatation or surrounding inflammatory changes. Spleen: Normal in size without focal abnormality. Adrenals/Urinary Tract: Adrenal glands are unremarkable. Kidneys are normal in size. Small bilateral subcentimeter simple renal cysts are seen. A left-sided endo ureteral stent is in place with mild left-sided hydronephrosis and mild periureteral inflammatory fat stranding. Small amount of air is seen within the upper pole calyx of the left kidney. Bladder is unremarkable. Stomach/Bowel: Stomach is within normal limits. Appendix appears normal. No evidence of bowel wall thickening, distention, or inflammatory changes. Vascular/Lymphatic: No significant vascular findings are present. No enlarged abdominal or pelvic lymph nodes. Reproductive: Prostate  is unremarkable. Other: There is a small, stable fat containing left inguinal hernia. No abdominopelvic ascites. Musculoskeletal: No acute or significant osseous findings. IMPRESSION: 1. Left-sided endo ureteral stent in place with mild left-sided hydronephrosis and mild periureteral inflammatory fat stranding. Sequelae associated with acute pyelonephritis cannot be excluded. 2. Small amount of air within the upper pole calyx of the left kidney, likely postprocedural. Electronically Signed   By: Suzen Dials M.D.   On: 04/05/2024 18:14    I independently reviewed the above imaging studies.  Impression/Recommendation Pt is a 33 yo male with PMH nephrolithiasis s/p left ureteroscopic stone extraction 04/02/24 who is currently admitted with worsened left flank pain and possible UTI.   AVFSS. Cr is baseline 0.95. WBC 10.4 on arrival. UA with moderate leukocytes, nitrite negative, >50 RBC, >50 WBC, many bacteria. CT with left stent in appropriate position with mild hydronephrosis and periureteral stranding.   Patient's increased pain may be 2/2 ureteral spasm vs ascending UTI. Reassuringly, he has had no systemic symptoms or fevers. His urine is currently thin, amber, and his Hgb is stable at 15.5. Given endoscopic surgery and patient stability, no suspicion for pseudoaneurysm at this time. Likewise, imaging  and labs reassuring against expanding hematoma. Recommend continuing empiric UTI treatment and supportive measures at this time.  - Agree with broad spectrum antibiotics, narrow as culture results - Ok to remove stent (patient may pull stent-on-string) - Multi-modal pain control per primary team  - Start Flomax  0.4 mg daily  Maurilio Agar 04/06/2024, 9:51 AM

## 2024-04-06 NOTE — Progress Notes (Signed)
 PHARMACY NOTE:  ANTIMICROBIAL RENAL DOSAGE ADJUSTMENT  Current antimicrobial regimen includes a mismatch between antimicrobial dosage and estimated renal function.  As per policy approved by the Pharmacy & Therapeutics and Medical Executive Committees, the antimicrobial dosage will be adjusted accordingly.  Current antimicrobial dosage:  Ceftriaxone  1g IV q24h  Indication: UTI, concerning for pyelonephritis  Renal Function:  Estimated Creatinine Clearance: 148.5 mL/min (by C-G formula based on SCr of 0.93 mg/dL).    Antimicrobial dosage has been changed to:  Ceftriaxone  2g IV q24h   Thank you for allowing pharmacy to be a part of this patient's care.  Wanda Hasting PharmD, BCPS [facility] main pharmacy [phone] 04/06/2024 7:49 AM

## 2024-04-06 NOTE — H&P (Signed)
 History and Physical    Jacob Nixon Jacob Nixon. FMW:[id number] DOB: 1991/01/12 DOA: 04/05/2024  DOS: the patient was seen and examined on 04/05/2024  PCP: [facility]   Patient coming from: Home  I have personally briefly reviewed patient's old medical records in [facility] and [facility]where  HPI:   Jacob Nixon. is a 33 y.o. year old male with medical history of recurrent nephrolithiasis, ureteral stone status post stone removal and stent placement on 04/02/2024 presenting to the ED with left flank pain and hematuria.   Pt states he was doing okay after stent placement on 11/14 with some pain and hematuria that responded to the oxycodone  and the hematuria resolved on Saturday but then he developed worsening flank pain and resumption of blood in his urine with him passing clots.  He stated he was supposed to remove his stent but did not feel comfortable doing this and called his urologist office who recommended he come to the clinic or drive to the ED.  Given he did not have any transportation at that moment he came to the ED.  He reports the pain is severe and states it starts in the left mid quadrant and radiates down into his groin.  It is worse with urination.   On arrival to the ED patient was noted to be HDS stable.  Lab work and imaging obtained.  CBC without any acute findings but WBC at upper limit of normal which appears to be baseline for patient, CMP overall unremarkable.  UA with signs of infection.  CT abdomen pelvis performed that showed left-sided stent in place and showed mild left-sided hydronephrosis with mild periurethral fat stranding which could possibly be secondary to acute pyelonephritis.  This was discussed with urology resident who recommended medicine admission for the pain control and they will follow in consult.  Given this, [facility] contacted for admission.  Review of Systems: As mentioned in the history of present illness. All other systems  reviewed and are negative.   Past Medical History:  Diagnosis Date   Abscess of upper arm    Bronchitis    Cellulitis of forearm, right 07/30/2015   Cellulitis of hand 08/04/2016   Cellulitis of right hand    Cellulitis of right hand - RECURRENT    Complication of anesthesia    woke up during sx one time   Kidney stones     Past Surgical History:  Procedure Laterality Date   CARPAL TUNNEL RELEASE Left 04/09/2016   Procedure: CARPAL TUNNEL RELEASE;  Surgeon: Glendia Cordella Hutchinson, MD;  Location: [facility] OR;  Service: Orthopedics;  Laterality: Left;   CARPAL TUNNEL RELEASE Right 12/18/2022   Procedure: REVISION CARPAL TUNNEL RELEASE;  Surgeon: Romona Harari, MD;  Location: [facility];  Service: Orthopedics;  Laterality: Right;   CYSTOSCOPY WITH RETROGRADE PYELOGRAM, URETEROSCOPY AND STENT PLACEMENT Bilateral 07/02/2020   Procedure: CYSTOSCOPY WITH RETROGRADE PYELOGRAM, URETEROSCOPY AND STENT PLACEMENT POSSIBLY BILATERAL;  Surgeon: Selma Donnice SAUNDERS, MD;  Location: [facility];  Service: Urology;  Laterality: Bilateral;   CYSTOSCOPY/URETEROSCOPY/HOLMIUM LASER/STENT PLACEMENT Bilateral 07/06/2020   Procedure: CYSTOSCOPY/URETEROSCOPY/HOLMIUM LASER/LEFT STENT EXCHANGE, RIGHT STENT PLACEMENT;  Surgeon: Penne Knee, MD;  Location: [facility];  Service: Urology;  Laterality: Bilateral;   CYSTOSCOPY/URETEROSCOPY/HOLMIUM LASER/STENT PLACEMENT Left 04/02/2024   Procedure: CYSTOSCOPY/URETEROSCOPY/HOLMIUM LASER/STENT PLACEMENT;  Surgeon: Cam Morene ORN, MD;  Location: [facility];  Service: Urology;  Laterality: Left;   EXTRACORPOREAL SHOCK WAVE LITHOTRIPSY Left 06/22/2020   Procedure: EXTRACORPOREAL SHOCK WAVE LITHOTRIPSY (  ESWL);  Surgeon: Penne Knee, MD;  Location: [facility];  Service: Urology;  Laterality: Left;   HAND SURGERY     HYPOTHENAR FAT PAD TRANSFER Right 12/18/2022   Procedure: HYPOTHENAR FAT PAD FLAP;  Surgeon: Romona Harari, MD;  Location: [facility];   Service: Orthopedics;  Laterality: Right;   KIDNEY STONE SURGERY     lithortripsy     Allergies  Allergen Reactions   Fentanyl  Other (See Comments)    Patient prefers to NOT take this   Coconut Flavoring Agent (Non-Screening) Other (See Comments)    Scratchy throat   Penicillins Swelling and Other (See Comments)    Patient had atypical chest pain with some shortness of breath after a dose of Bicillin  CR in January 2020. ED physician noted no signs of allergic reaction at the time. Since that time tolerated Zosyn  X 1 dose in the ED 08/22/21     Latex Rash    Family History  Problem Relation Age of Onset   Hypertension Other    Diabetes Father    Healthy Mother     Prior to Admission medications   Medication Sig Start Date End Date Taking? Authorizing Provider  fexofenadine (ALLEGRA) 180 MG tablet Take 180 mg by mouth daily as needed for allergies or rhinitis.    [provider]  ketorolac  (TORADOL ) 10 MG tablet Take 1 tablet (10 mg total) by mouth every 6 (six) hours as needed. 12/16/23   Prosperi, Christian H, PA-C  ondansetron  (ZOFRAN ) 4 MG tablet Take 1 tablet (4 mg total) by mouth every 8 (eight) hours as needed for nausea or vomiting. 01/28/24   Bauer, Collin S, PA-C  oxyCODONE  (ROXICODONE ) 5 MG immediate release tablet Take 1 tablet (5 mg total) by mouth every 8 (eight) hours as needed for up to 5 doses for severe pain (pain score 7-10). 04/02/24   Cam Morene ORN, MD  tamsulosin  (FLOMAX ) 0.4 MG CAPS capsule Take 1 capsule (0.4 mg total) by mouth daily. Until stone passes 04/02/24   Cam Morene ORN, MD    Social History:  reports that he has quit smoking. His smoking use included cigarettes. He has never used smokeless tobacco. He reports current alcohol use. He reports that he does not use drugs. Lives with wife Tobacco- Denies use. EtOH-occasional use Illicit drug use- denies use.  IADLs/ADLs- can perform independently at baseline    Physical  Exam: Vitals:   04/05/24 2230 04/05/24 2255 04/05/24 2349 04/06/24 0000  BP: 126/75   122/76  Pulse: 94   90  Resp: 16   18  Temp:  98.5 F (36.9 C)  98.1 F (36.7 C)  TempSrc:  Oral  Oral  SpO2: 98%   93%  Weight:   121.6 kg   Height:   5' 11 (1.803 m)      Gen: NAD HENT: NCAT CV: Regular rate rhythm Resp: CTAB Abd: TTP of left mid quadrant, soft abdomen, bowel sounds present MSK: No symmetry Skin: No lesions noted on skin Neuro: Alert and oriented x 4 Psych: Pleasant mood   Labs on Admission: I have personally reviewed following labs and imaging studies  CBC: Recent Labs  Lab 04/05/24 1620  WBC 10.4  NEUTROABS 6.0  HGB 15.9  HCT 46.9  MCV 86.7  PLT 305   Basic Metabolic Panel: Recent Labs  Lab 04/05/24 1620  NA 138  K 3.8  CL 102  CO2 27  GLUCOSE 106*  BUN 11  CREATININE 0.93  CALCIUM  9.7   GFR: Estimated Creatinine Clearance: 148.5 mL/min (by C-G formula based on SCr of 0.93 mg/dL). Liver Function Tests: Recent Labs  Lab 04/05/24 1620  AST 24  ALT 34  ALKPHOS 103  BILITOT 0.5  PROT 7.6  ALBUMIN 3.8   No results for input(s): LIPASE, AMYLASE in the last 168 hours. No results for input(s): AMMONIA in the last 168 hours. Coagulation Profile: Recent Labs  Lab 04/05/24 1620  INR 1.0   Cardiac Enzymes: No results for input(s): CKTOTAL, CKMB, CKMBINDEX, TROPONINI, TROPONINIHS in the last 168 hours. BNP (last 3 results) No results for input(s): BNP in the last 8760 hours. HbA1C: No results for input(s): HGBA1C in the last 72 hours. CBG: No results for input(s): GLUCAP in the last 168 hours. Lipid Profile: No results for input(s): CHOL, HDL, LDLCALC, TRIG, CHOLHDL, LDLDIRECT in the last 72 hours. Thyroid Function Tests: No results for input(s): TSH, T4TOTAL, FREET4, T3FREE, THYROIDAB in the last 72 hours. Anemia Panel: No results for input(s): VITAMINB12, FOLATE, FERRITIN, TIBC,  IRON, RETICCTPCT in the last 72 hours. Urine analysis:    Component Value Date/Time   COLORURINE ORANGE (A) 04/05/2024 1851   APPEARANCEUR HAZY (A) 04/05/2024 1851   APPEARANCEUR Cloudy (A) 06/15/2020 1411   LABSPEC >1.046 (H) 04/05/2024 1851   PHURINE 6.5 04/05/2024 1851   GLUCOSEU NEGATIVE 04/05/2024 1851   HGBUR LARGE (A) 04/05/2024 1851   BILIRUBINUR NEGATIVE 04/05/2024 1851   BILIRUBINUR Negative 06/15/2020 1411   KETONESUR NEGATIVE 04/05/2024 1851   PROTEINUR 30 (A) 04/05/2024 1851   UROBILINOGEN 1.0 11/28/2014 1026   NITRITE NEGATIVE 04/05/2024 1851   LEUKOCYTESUR MODERATE (A) 04/05/2024 1851    Radiological Exams on Admission: I have personally reviewed images CT ABDOMEN PELVIS W CONTRAST Result Date: 04/05/2024 CLINICAL DATA:  Recent renal stone removal and stent placement presenting with subsequent gross hematuria. EXAM: CT ABDOMEN AND PELVIS WITH CONTRAST TECHNIQUE: Multidetector CT imaging of the abdomen and pelvis was performed using the standard protocol following bolus administration of intravenous contrast. RADIATION DOSE REDUCTION: This exam was performed according to the departmental dose-optimization program which includes automated exposure control, adjustment of the mA and/or kV according to patient size and/or use of iterative reconstruction technique. CONTRAST:  OMNIPAQUE  IOHEXOL  350 MG/ML SOLN COMPARISON:  March 25, 2024 FINDINGS: Lower chest: No acute abnormality. Hepatobiliary: No focal liver abnormality is seen. No gallstones, gallbladder wall thickening, or biliary dilatation. Pancreas: Unremarkable. No pancreatic ductal dilatation or surrounding inflammatory changes. Spleen: Normal in size without focal abnormality. Adrenals/Urinary Tract: Adrenal glands are unremarkable. Kidneys are normal in size. Small bilateral subcentimeter simple renal cysts are seen. A left-sided endo ureteral stent is in place with mild left-sided hydronephrosis and mild  periureteral inflammatory fat stranding. Small amount of air is seen within the upper pole calyx of the left kidney. Bladder is unremarkable. Stomach/Bowel: Stomach is within normal limits. Appendix appears normal. No evidence of bowel wall thickening, distention, or inflammatory changes. Vascular/Lymphatic: No significant vascular findings are present. No enlarged abdominal or pelvic lymph nodes. Reproductive: Prostate is unremarkable. Other: There is a small, stable fat containing left inguinal hernia. No abdominopelvic ascites. Musculoskeletal: No acute or significant osseous findings. IMPRESSION: 1. Left-sided endo ureteral stent in place with mild left-sided hydronephrosis and mild periureteral inflammatory fat stranding. Sequelae associated with acute pyelonephritis cannot be excluded. 2. Small amount of air within the upper pole calyx of the left kidney, likely postprocedural. Electronically Signed   By: Suzen Dwane HERO.D.  On: 04/05/2024 18:14     Assessment/Plan Principal Problem:   Complicated urinary tract infection Active Problems:   Intractable pain   Patient with symptoms of dysuria, hematuria and image findings concerning for pyelonephritis with recent stent placement admitted for IV antibiotics and pain control.  He is getting IV Rocephin .  Urine cultures pending.  Pain control with oral and IV Dilaudid .  Pain appears well-controlled currently.  Urology consulted by EDP and appreciate their recommendations.  VTE prophylaxis:  SCDs  Diet: Heart healthy Code Status:  Full Code Telemetry:  Admission status: Observation, Med-Surg Patient is from: Home Anticipated d/c is to: Home Anticipated d/c is in: 1-2 days   Family Communication: Updated at bedside  Consults called: None   Severity of Illness: The appropriate patient status for this patient is OBSERVATION. Observation status is judged to be reasonable and necessary in order to provide the required intensity of service  to ensure the patient's safety. The patient's presenting symptoms, physical exam findings, and initial radiographic and laboratory data in the context of their medical condition is felt to place them at decreased risk for further clinical deterioration. Furthermore, it is anticipated that the patient will be medically stable for discharge from the hospital within 2 midnights of admission.    Morene Bathe, MD Jolynn DEL. [facility]

## 2024-04-06 NOTE — Plan of Care (Signed)
  Problem: Education: Goal: Knowledge of General Education information will improve Description: Including pain rating scale, medication(s)/side effects and non-pharmacologic comfort measures Outcome: Progressing   Problem: Clinical Measurements: Goal: Respiratory complications will improve Outcome: Progressing Goal: Cardiovascular complication will be avoided Outcome: Progressing   Problem: Health Behavior/Discharge Planning: Goal: Ability to manage health-related needs will improve Outcome: Not Progressing   Problem: Clinical Measurements: Goal: Ability to maintain clinical measurements within normal limits will improve Outcome: Not Progressing Goal: Will remain free from infection Outcome: Not Progressing Goal: Diagnostic test results will improve Outcome: Not Progressing

## 2024-04-06 NOTE — Progress Notes (Signed)
    Patient: Jacob Nixon. FMW:[id number] DOB: 1991/03/22      Brief hospital course: 33 y.o. M with obesity class 2, anxiety, recent kidney stone and stent by Dr. Cam presented with left flank pain, chills.    This is a no charge note, for further details, please see the H&P by my partner, Dr. Fernand from earlier today.   Principal Problem:   Possible urinary tract infection Active Problems:   Intractable pain   Kidney stone   Class 2 obesity   Anxiety   Discussed with Urology, they will remove the stent today.  There may be infection, so they recommend 24 more hours empiric antibiotics and follow up urine culture.  Possibly home tomorrow.   - Continue Flomax  - Continue home Lexapro and Buspar - Oxycodone  or Toradol  for pain   Physical Exam: BP 132/81 (BP Location: Left Arm)   Pulse 78   Temp (!) 97.4 F (36.3 C) (Oral)   Resp 20   Ht 5' 11 (1.803 m)   Wt 121.6 kg   SpO2 97%   BMI 37.38 kg/m   Patient seen and examined.             Author: Lonni SHAUNNA Dalton, MD 04/06/2024 2:05 PM

## 2024-04-07 ENCOUNTER — Other Ambulatory Visit: Payer: Self-pay

## 2024-04-07 ENCOUNTER — Other Ambulatory Visit (HOSPITAL_COMMUNITY): Payer: Self-pay

## 2024-04-07 DIAGNOSIS — N390 Urinary tract infection, site not specified: Secondary | ICD-10-CM | POA: Diagnosis not present

## 2024-04-07 LAB — COMPREHENSIVE METABOLIC PANEL WITH GFR
ALT: 32 U/L (ref 0–44)
AST: 22 U/L (ref 15–41)
Albumin: 3.6 g/dL (ref 3.5–5.0)
Alkaline Phosphatase: 89 U/L (ref 38–126)
Anion gap: 9 (ref 5–15)
BUN: 10 mg/dL (ref 6–20)
CO2: 25 mmol/L (ref 22–32)
Calcium: 8.9 mg/dL (ref 8.9–10.3)
Chloride: 103 mmol/L (ref 98–111)
Creatinine, Ser: 0.98 mg/dL (ref 0.61–1.24)
GFR, Estimated: >60 mL/min (ref >60)
Glucose, Bld: 100 mg/dL — ABNORMAL HIGH (ref 70–99)
Potassium: 4 mmol/L (ref 3.5–5.1)
Sodium: 138 mmol/L (ref 135–145)
Total Bilirubin: 0.4 mg/dL (ref 0.0–1.2)
Total Protein: 6.8 g/dL (ref 6.5–8.1)

## 2024-04-07 LAB — CBC
HCT: 45.7 % (ref 39.0–52.0)
Hemoglobin: 15.2 g/dL (ref 13.0–17.0)
MCH: 29.2 pg (ref 26.0–34.0)
MCHC: 33.3 g/dL (ref 30.0–36.0)
MCV: 87.7 fL (ref 80.0–100.0)
Platelets: 272 K/uL (ref 150–400)
RBC: 5.21 MIL/uL (ref 4.22–5.81)
RDW: 13.6 % (ref 11.5–15.5)
WBC: 8.6 K/uL (ref 4.0–10.5)
nRBC: 0 % (ref 0.0–0.2)

## 2024-04-07 LAB — GLUCOSE, CAPILLARY: Glucose-Capillary: 106 mg/dL — ABNORMAL HIGH (ref 70–99)

## 2024-04-07 MED ORDER — GLUCAGON HCL RDNA (DIAGNOSTIC) 1 MG IJ SOLR: 1.0000 mg | INTRAMUSCULAR | Status: DC | PRN

## 2024-04-07 MED ORDER — METOPROLOL TARTRATE 5 MG/5ML IV SOLN: 5.0000 mg | INTRAVENOUS | Status: DC | PRN

## 2024-04-07 MED ORDER — HYDRALAZINE HCL 20 MG/ML IJ SOLN: 10.0000 mg | INTRAMUSCULAR | Status: DC | PRN

## 2024-04-07 MED ORDER — CEPHALEXIN 250 MG PO CAPS
750.0000 mg | ORAL_CAPSULE | Freq: Three times a day (TID) | ORAL | 0 refills | Status: AC
  Filled 2024-04-07: qty 63, 7d supply, fill #0

## 2024-04-07 MED ORDER — IPRATROPIUM-ALBUTEROL 0.5-2.5 (3) MG/3ML IN SOLN: 3.0000 mL | RESPIRATORY_TRACT | Status: DC | PRN

## 2024-04-07 NOTE — Discharge Summary (Signed)
 Physician Discharge Summary  Jacob Nixon Dasie Mickey. FMW:[id number] DOB: 07-02-1990 DOA: 04/05/2024  PCP: [facility]  Admit date: 04/05/2024 Discharge date: 04/07/2024  Admitted From: Home Disposition:  Home  Recommendations for Outpatient Follow-up:  Follow up with PCP in 1-2 weeks Please obtain BMP/CBC in one week your next doctors visit.  PO Keflex  for 7 days   Discharge Condition: Stable CODE STATUS: Full Diet recommendation: Regular  Brief/Interim Summary: Brief Narrative:   33 y.o. year old male with medical history of recurrent nephrolithiasis, ureteral stone status post stone removal and stent placement on 04/02/2024 presenting to the ED with left flank pain and hematuria.  CT showed left stent in appropriate position with mild hydronephrosis and perinephric stranding.  Urology team was consulted and it was recommended to pull the stent and start patient on Flomax .  Cultures are still pending, discussed with urology.  Plans to discharge him on p.o. Keflex   Assessment & Plan:  Complicated urinary tract infection Recurrent nephrolithiasis with renal stones requiring stent placement - Patient had recent stone removal and stent placement 11/14 now presenting with left-sided flank pain and hematuria.  Urology removed stent.  Recommending transitioning IV Rocephin  to p.o. Keflex  and discharging the patient   DVT prophylaxis: SCDs Start: 04/06/24 0056    Code Status: Full Code Family Communication:   Status is: Inpatient Remains inpatient appropriate because: Home  PT Follow up Recs:   Subjective:  Doing ok.    Examination:  General exam: Appears calm and comfortable  Respiratory system: Clear to auscultation. Respiratory effort normal. Cardiovascular system: S1 & S2 heard, RRR. No JVD, murmurs, rubs, gallops or clicks. No pedal edema. Gastrointestinal system: Abdomen is nondistended, soft and nontender. No organomegaly or masses felt. Normal bowel sounds  heard. Central nervous system: Alert and oriented. No focal neurological deficits. Extremities: Symmetric 5 x 5 power. Skin: No rashes, lesions or ulcers Psychiatry: Judgement and insight appear normal. Mood & affect appropriate.    Discharge Diagnoses:  Principal Problem:   Complicated urinary tract infection Active Problems:   Intractable pain      Discharge Exam: Vitals:   04/06/24 1949 04/07/24 0609  BP: (!) 133/97 127/82  Pulse: 87 73  Resp:  16  Temp: 99.5 F (37.5 C) 98.4 F (36.9 C)  SpO2: 98% 97%   Vitals:   04/06/24 0737 04/06/24 1209 04/06/24 1949 04/07/24 0609  BP: 117/88 132/81 (!) 133/97 127/82  Pulse: 75 78 87 73  Resp: 20 20  16   Temp: (!) 97.4 F (36.3 C)  99.5 F (37.5 C) 98.4 F (36.9 C)  TempSrc: Oral  Oral Oral  SpO2: 99% 97% 98% 97%  Weight:      Height:          Discharge Instructions   Allergies as of 04/07/2024       Reactions   Fentanyl  Other (See Comments)   Patient prefers to NOT take this   Coconut Flavoring Agent (non-screening) Other (See Comments)   Scratchy throat   Penicillins Swelling, Other (See Comments)   Patient had atypical chest pain with some shortness of breath after a dose of Bicillin  CR in January 2020. ED physician noted no signs of allergic reaction at the time. Since that time tolerated Zosyn  X 1 dose in the ED 08/22/21    Latex Rash        Medication List     TAKE these medications    busPIRone 10 MG tablet Commonly known as: BUSPAR Take  10 mg by mouth 2 (two) times daily.   cephALEXin  250 MG capsule Commonly known as: KEFLEX  Take 3 capsules (750 mg total) by mouth 3 (three) times daily for 7 days.   escitalopram 10 MG tablet Commonly known as: LEXAPRO Take 10 mg by mouth daily.   fexofenadine 180 MG tablet Commonly known as: ALLEGRA Take 180 mg by mouth daily as needed for allergies or rhinitis.   ketorolac  10 MG tablet Commonly known as: TORADOL  Take 1 tablet (10 mg total) by mouth  every 6 (six) hours as needed.   ondansetron  4 MG tablet Commonly known as: ZOFRAN  Take 1 tablet (4 mg total) by mouth every 8 (eight) hours as needed for nausea or vomiting.   oxyCODONE  5 MG immediate release tablet Commonly known as: Roxicodone  Take 1 tablet (5 mg total) by mouth every 8 (eight) hours as needed for up to 5 doses for severe pain (pain score 7-10).   tamsulosin  0.4 MG Caps capsule Commonly known as: Flomax  Take 1 capsule (0.4 mg total) by mouth daily. Until stone passes        Follow-up Information     [facility] Follow up in 1 week(s).   Contact information: [address] [facility] [place] [address] [phone]                Allergies  Allergen Reactions   Fentanyl  Other (See Comments)    Patient prefers to NOT take this   Coconut Flavoring Agent (Non-Screening) Other (See Comments)    Scratchy throat   Penicillins Swelling and Other (See Comments)    Patient had atypical chest pain with some shortness of breath after a dose of Bicillin  CR in January 2020. ED physician noted no signs of allergic reaction at the time. Since that time tolerated Zosyn  X 1 dose in the ED 08/22/21     Latex Rash    You were cared for by a hospitalist during your hospital stay. If you have any questions about your discharge medications or the care you received while you were in the hospital after you are discharged, you can call the unit and asked to speak with the hospitalist on call if the hospitalist that took care of you is not available. Once you are discharged, your primary care physician will handle any further medical issues. Please note that no refills for any discharge medications will be authorized once you are discharged, as it is imperative that you return to your primary care physician (or establish a relationship with a primary care physician if you do not have one) for your aftercare needs so that they can reassess your need for medications  and monitor your lab values.  You were cared for by a hospitalist during your hospital stay. If you have any questions about your discharge medications or the care you received while you were in the hospital after you are discharged, you can call the unit and asked to speak with the hospitalist on call if the hospitalist that took care of you is not available. Once you are discharged, your primary care physician will handle any further medical issues. Please note that NO REFILLS for any discharge medications will be authorized once you are discharged, as it is imperative that you return to your primary care physician (or establish a relationship with a primary care physician if you do not have one) for your aftercare needs so that they can reassess your need for medications and monitor your lab values.  Please request your Prim.MD to go over all Hospital Tests and Procedure/Radiological results at the follow up, please get all Hospital records sent to your Prim MD by signing hospital release before you go home.  Get CBC, CMP, 2 view Chest X ray checked  by Primary MD during your next visit or SNF MD in 5-7 days ( we routinely change or add medications that can affect your baseline labs and fluid status, therefore we recommend that you get the mentioned basic workup next visit with your PCP, your PCP may decide not to get them or add new tests based on their clinical decision)  On your next visit with your primary care physician please Get Medicines reviewed and adjusted.  If you experience worsening of your admission symptoms, develop shortness of breath, life threatening emergency, suicidal or homicidal thoughts you must seek medical attention immediately by calling 911 or calling your MD immediately  if symptoms less severe.  You Must read complete instructions/literature along with all the possible adverse reactions/side effects for all the Medicines you take and that have been prescribed to you. Take  any new Medicines after you have completely understood and accpet all the possible adverse reactions/side effects.   Do not drive, operate heavy machinery, perform activities at heights, swimming or participation in water activities or provide baby sitting services if your were admitted for syncope or siezures until you have seen by Primary MD or a Neurologist and advised to do so again.  Do not drive when taking Pain medications.   Procedures/Studies: CT ABDOMEN PELVIS W CONTRAST Result Date: 04/05/2024 CLINICAL DATA:  Recent renal stone removal and stent placement presenting with subsequent gross hematuria. EXAM: CT ABDOMEN AND PELVIS WITH CONTRAST TECHNIQUE: Multidetector CT imaging of the abdomen and pelvis was performed using the standard protocol following bolus administration of intravenous contrast. RADIATION DOSE REDUCTION: This exam was performed according to the departmental dose-optimization program which includes automated exposure control, adjustment of the mA and/or kV according to patient size and/or use of iterative reconstruction technique. CONTRAST:  OMNIPAQUE  IOHEXOL  350 MG/ML SOLN COMPARISON:  March 25, 2024 FINDINGS: Lower chest: No acute abnormality. Hepatobiliary: No focal liver abnormality is seen. No gallstones, gallbladder wall thickening, or biliary dilatation. Pancreas: Unremarkable. No pancreatic ductal dilatation or surrounding inflammatory changes. Spleen: Normal in size without focal abnormality. Adrenals/Urinary Tract: Adrenal glands are unremarkable. Kidneys are normal in size. Small bilateral subcentimeter simple renal cysts are seen. A left-sided endo ureteral stent is in place with mild left-sided hydronephrosis and mild periureteral inflammatory fat stranding. Small amount of air is seen within the upper pole calyx of the left kidney. Bladder is unremarkable. Stomach/Bowel: Stomach is within normal limits. Appendix appears normal. No evidence of bowel wall  thickening, distention, or inflammatory changes. Vascular/Lymphatic: No significant vascular findings are present. No enlarged abdominal or pelvic lymph nodes. Reproductive: Prostate is unremarkable. Other: There is a small, stable fat containing left inguinal hernia. No abdominopelvic ascites. Musculoskeletal: No acute or significant osseous findings. IMPRESSION: 1. Left-sided endo ureteral stent in place with mild left-sided hydronephrosis and mild periureteral inflammatory fat stranding. Sequelae associated with acute pyelonephritis cannot be excluded. 2. Small amount of air within the upper pole calyx of the left kidney, likely postprocedural. Electronically Signed   By: Suzen Dials M.D.   On: 04/05/2024 18:14   DG C-Arm 1-60 Min-No Report Result Date: 04/02/2024 Fluoroscopy was utilized by the requesting physician.  No radiographic interpretation.   DG Abd 1 View Result Date:  04/02/2024 EXAM: 1 VIEW XRAY OF THE ABDOMEN 04/02/2024 07:07:00 AM COMPARISON: 03/30/2024 CLINICAL HISTORY: Left renal stone. FINDINGS: BOWEL: Nonobstructive bowel gas pattern. Mostly gaseous but otherwise unremarkable bowel. SOFT TISSUES: There is a faint suggestion of a left kidney lower pole nonobstructive calculus measuring 3 mm in diameter. No other urinary tract stone candidates identified. BONES: No acute osseous abnormality. JOINTS: Mild degenerative arthropathy of both hips. IMPRESSION: 1. Faint suggestion of a 3 mm nonobstructive calculus in the left kidney lower pole. No other urinary tract stone candidates identified. 2. Mild degenerative arthropathy of both hips. Electronically signed by: Ryan Salvage MD 04/02/2024 11:32 AM EST RP Workstation: [id number]   CT Renal Stone Study Result Date: 03/25/2024 CLINICAL DATA:  Left flank pain, hematuria, history of urinary tract calculus EXAM: CT ABDOMEN AND PELVIS WITHOUT CONTRAST TECHNIQUE: Multidetector CT imaging of the abdomen and pelvis was performed following  the standard protocol without IV contrast. RADIATION DOSE REDUCTION: This exam was performed according to the departmental dose-optimization program which includes automated exposure control, adjustment of the mA and/or kV according to patient size and/or use of iterative reconstruction technique. COMPARISON:  01/28/2024 FINDINGS: Lower chest: No acute pleural or parenchymal lung disease. Hepatobiliary: Unremarkable unenhanced appearance of the liver and gallbladder. No biliary duct dilation. Pancreas: Unremarkable unenhanced appearance. Spleen: Unremarkable unenhanced appearance. Adrenals/Urinary Tract: Stable 6 mm nonobstructing calculus lower pole left kidney. There are 2 punctate less than 2 mm nonobstructing right renal calculi, also unchanged since prior exam. No obstructive uropathy within either kidney. The adrenals and bladder appear unremarkable. Stomach/Bowel: No bowel obstruction or ileus. Normal appendix right lower quadrant. No bowel wall thickening or inflammatory change. Vascular/Lymphatic: No significant vascular findings are present. No enlarged abdominal or pelvic lymph nodes. Reproductive: Prostate is unremarkable. Other: No free fluid or free intraperitoneal gas. No abdominal wall hernia. Musculoskeletal: No acute or destructive bony abnormalities. Reconstructed images demonstrate no additional findings. IMPRESSION: 1. Stable bilateral nonobstructing renal calculi. 2. Otherwise unremarkable unenhanced exam.  Normal appendix. Electronically Signed   By: Ozell Daring M.D.   On: 03/25/2024 20:04     The results of significant diagnostics from this hospitalization (including imaging, microbiology, ancillary and laboratory) are listed below for reference.     Microbiology: No results found for this or any previous visit (from the past 240 hours).   Labs: BNP (last 3 results) No results for input(s): BNP in the last 8760 hours. Basic Metabolic Panel: Recent Labs  Lab 04/05/24 1620  04/06/24 0709 04/07/24 0607  NA 138 137 138  K 3.8 3.8 4.0  CL 102 102 103  CO2 27 26 25   GLUCOSE 106* 90 100*  BUN 11 11 10   CREATININE 0.93 0.95 0.98  CALCIUM  9.7 8.9 8.9  MG  --  2.2  --    Liver Function Tests: Recent Labs  Lab 04/05/24 1620 04/07/24 0607  AST 24 22  ALT 34 32  ALKPHOS 103 89  BILITOT 0.5 0.4  PROT 7.6 6.8  ALBUMIN 3.8 3.6   No results for input(s): LIPASE, AMYLASE in the last 168 hours. No results for input(s): AMMONIA in the last 168 hours. CBC: Recent Labs  Lab 04/05/24 1620 04/06/24 0709 04/07/24 0607  WBC 10.4 8.8 8.6  NEUTROABS 6.0  --   --   HGB 15.9 15.5 15.2  HCT 46.9 46.5 45.7  MCV 86.7 88.1 87.7  PLT 305 291 272   Cardiac Enzymes: No results for input(s): CKTOTAL, CKMB, CKMBINDEX, TROPONINI in the last 168  hours. BNP: Invalid input(s): POCBNP CBG: Recent Labs  Lab 04/06/24 0740 04/07/24 0802  GLUCAP 103* 106*   D-Dimer No results for input(s): DDIMER in the last 72 hours. Hgb A1c No results for input(s): HGBA1C in the last 72 hours. Lipid Profile No results for input(s): CHOL, HDL, LDLCALC, TRIG, CHOLHDL, LDLDIRECT in the last 72 hours. Thyroid function studies No results for input(s): TSH, T4TOTAL, T3FREE, THYROIDAB in the last 72 hours.  Invalid input(s): FREET3 Anemia work up No results for input(s): VITAMINB12, FOLATE, FERRITIN, TIBC, IRON, RETICCTPCT in the last 72 hours. Urinalysis    Component Value Date/Time   COLORURINE ORANGE (A) 04/05/2024 1851   APPEARANCEUR HAZY (A) 04/05/2024 1851   APPEARANCEUR Cloudy (A) 06/15/2020 1411   LABSPEC >1.046 (H) 04/05/2024 1851   PHURINE 6.5 04/05/2024 1851   GLUCOSEU NEGATIVE 04/05/2024 1851   HGBUR LARGE (A) 04/05/2024 1851   BILIRUBINUR NEGATIVE 04/05/2024 1851   BILIRUBINUR Negative 06/15/2020 1411   KETONESUR NEGATIVE 04/05/2024 1851   PROTEINUR 30 (A) 04/05/2024 1851   UROBILINOGEN 1.0 11/28/2014 1026    NITRITE NEGATIVE 04/05/2024 1851   LEUKOCYTESUR MODERATE (A) 04/05/2024 1851   Sepsis Labs Recent Labs  Lab 04/05/24 1620 04/06/24 0709 04/07/24 0607  WBC 10.4 8.8 8.6   Microbiology No results found for this or any previous visit (from the past 240 hours).   Time coordinating discharge:  I have spent 35 minutes face to face with the patient and on the ward discussing the patients care, assessment, plan and disposition with other care givers. >50% of the time was devoted counseling the patient about the risks and benefits of treatment/Discharge disposition and coordinating care.   SIGNED:   Burgess JAYSON Dare, MD  [facility] 04/07/2024, 1:13 PM   If 7PM-7AM, please contact night-coverage

## 2024-04-07 NOTE — Progress Notes (Signed)
 Discharge medication delivered to patient at the bedside in a secure bag

## 2024-04-07 NOTE — Progress Notes (Signed)
 Subjective: Patient sitting up in bed on the phone on my arrival.  He was very angry with me for reasons which are not completely clear regarding stent removal yesterday, on arrival cursed at me and insisted I leave the room.  His only clarifying statement was that I ripped his stent out and left him lying naked .  Following stent removal I explained what to expect and the plan.  As he is a completely physically competent adult, I did not dress him afterwards.   Objective: Vital signs in last 24 hours: Temp:  [98.4 F (36.9 C)-99.5 F (37.5 C)] 98.4 F (36.9 C) (11/19 0609) Pulse Rate:  [73-87] 73 (11/19 0609) Resp:  [16-20] 16 (11/19 0609) BP: (127-133)/(81-97) 127/82 (11/19 0609) SpO2:  [97 %-98 %] 97 % (11/19 9390)  Assessment/Plan: # Stent colic # UTI?  Removed stent the afternoon 04/06/2024.  Stent was withdrawn freely in its entirety.  Urinalysis citing many bacteria, suggestive of UTI.  Specific gravity highly concentrated, consistent with dehydration.  Culture on 11/6 clear.  Culture from 11/18 still processing.  Patient remains on prophylactic Rocephin - x2 doses.  Has been afebrile throughout this admission.  Reasonable to transition to comparable oral coverage for 5-7 days total treatment.  Cleared to discharge from urologic perspective   Intake/Output from previous day: 11/18 0701 - 11/19 0700 In: 839.7 [P.O.:240; I.V.:499.7; IV Piggyback:100] Out: 3300 [Urine:3300]  Intake/Output this shift: No intake/output data recorded.  Physical Exam:  declined  Lab Results: Recent Labs    04/05/24 1620 04/06/24 0709 04/07/24 0607  HGB 15.9 15.5 15.2  HCT 46.9 46.5 45.7   BMET Recent Labs    04/06/24 0709 04/07/24 0607  NA 137 138  K 3.8 4.0  CL 102 103  CO2 26 25  GLUCOSE 90 100*  BUN 11 10  CREATININE 0.95 0.98  CALCIUM  8.9 8.9  HGB 15.5 15.2  WBC 8.8 8.6     Studies/Results: CT ABDOMEN PELVIS W CONTRAST Result Date: 04/05/2024 CLINICAL  DATA:  Recent renal stone removal and stent placement presenting with subsequent gross hematuria. EXAM: CT ABDOMEN AND PELVIS WITH CONTRAST TECHNIQUE: Multidetector CT imaging of the abdomen and pelvis was performed using the standard protocol following bolus administration of intravenous contrast. RADIATION DOSE REDUCTION: This exam was performed according to the departmental dose-optimization program which includes automated exposure control, adjustment of the mA and/or kV according to patient size and/or use of iterative reconstruction technique. CONTRAST:  OMNIPAQUE  IOHEXOL  350 MG/ML SOLN COMPARISON:  March 25, 2024 FINDINGS: Lower chest: No acute abnormality. Hepatobiliary: No focal liver abnormality is seen. No gallstones, gallbladder wall thickening, or biliary dilatation. Pancreas: Unremarkable. No pancreatic ductal dilatation or surrounding inflammatory changes. Spleen: Normal in size without focal abnormality. Adrenals/Urinary Tract: Adrenal glands are unremarkable. Kidneys are normal in size. Small bilateral subcentimeter simple renal cysts are seen. A left-sided endo ureteral stent is in place with mild left-sided hydronephrosis and mild periureteral inflammatory fat stranding. Small amount of air is seen within the upper pole calyx of the left kidney. Bladder is unremarkable. Stomach/Bowel: Stomach is within normal limits. Appendix appears normal. No evidence of bowel wall thickening, distention, or inflammatory changes. Vascular/Lymphatic: No significant vascular findings are present. No enlarged abdominal or pelvic lymph nodes. Reproductive: Prostate is unremarkable. Other: There is a small, stable fat containing left inguinal hernia. No abdominopelvic ascites. Musculoskeletal: No acute or significant osseous findings. IMPRESSION: 1. Left-sided endo ureteral stent in place with mild left-sided hydronephrosis and  mild periureteral inflammatory fat stranding. Sequelae associated with acute  pyelonephritis cannot be excluded. 2. Small amount of air within the upper pole calyx of the left kidney, likely postprocedural. Electronically Signed   By: Suzen Dials M.D.   On: 04/05/2024 18:14      LOS: 1 day   Ole Bourdon, NP [facility] Pager: [phone]  04/07/2024, 8:50 AM

## 2024-04-07 NOTE — Hospital Course (Addendum)
 Brief Narrative:   33 y.o. year old male with medical history of recurrent nephrolithiasis, ureteral stone status post stone removal and stent placement on 04/02/2024 presenting to the ED with left flank pain and hematuria.  CT showed left stent in appropriate position with mild hydronephrosis and perinephric stranding.  Urology team was consulted and it was recommended to pull the stent and start patient on Flomax .  Cultures are still pending, discussed with urology.  Plans to discharge him on p.o. Keflex   Assessment & Plan:  Complicated urinary tract infection Recurrent nephrolithiasis with renal stones requiring stent placement - Patient had recent stone removal and stent placement 11/14 now presenting with left-sided flank pain and hematuria.  Urology removed stent.  Recommending transitioning IV Rocephin  to p.o. Keflex  and discharging the patient   DVT prophylaxis: SCDs Start: 04/06/24 0056    Code Status: Full Code Family Communication:   Status is: Inpatient Remains inpatient appropriate because: Home  PT Follow up Recs:   Subjective:  Doing ok.    Examination:  General exam: Appears calm and comfortable  Respiratory system: Clear to auscultation. Respiratory effort normal. Cardiovascular system: S1 & S2 heard, RRR. No JVD, murmurs, rubs, gallops or clicks. No pedal edema. Gastrointestinal system: Abdomen is nondistended, soft and nontender. No organomegaly or masses felt. Normal bowel sounds heard. Central nervous system: Alert and oriented. No focal neurological deficits. Extremities: Symmetric 5 x 5 power. Skin: No rashes, lesions or ulcers Psychiatry: Judgement and insight appear normal. Mood & affect appropriate.

## 2024-04-08 LAB — URINE CULTURE: Culture: NO GROWTH

## 2024-04-08 LAB — MISC LABCORP TEST (SEND OUT): Labcorp test code: 83935

## 2024-05-30 ENCOUNTER — Emergency Department (HOSPITAL_BASED_OUTPATIENT_CLINIC_OR_DEPARTMENT_OTHER)
Admission: EM | Admit: 2024-05-30 | Discharge: 2024-05-31 | Disposition: A | Attending: Emergency Medicine | Admitting: Emergency Medicine

## 2024-05-30 ENCOUNTER — Encounter (HOSPITAL_BASED_OUTPATIENT_CLINIC_OR_DEPARTMENT_OTHER): Payer: Self-pay | Admitting: *Deleted

## 2024-05-30 ENCOUNTER — Emergency Department (HOSPITAL_BASED_OUTPATIENT_CLINIC_OR_DEPARTMENT_OTHER)

## 2024-05-30 ENCOUNTER — Other Ambulatory Visit: Payer: Self-pay

## 2024-05-30 DIAGNOSIS — R0789 Other chest pain: Secondary | ICD-10-CM | POA: Diagnosis present

## 2024-05-30 DIAGNOSIS — Z91040 Latex allergy status: Secondary | ICD-10-CM | POA: Diagnosis not present

## 2024-05-30 DIAGNOSIS — R079 Chest pain, unspecified: Secondary | ICD-10-CM

## 2024-05-30 LAB — BASIC METABOLIC PANEL WITH GFR
Anion gap: 10 (ref 5–15)
BUN: 11 mg/dL (ref 6–20)
CO2: 25 mmol/L (ref 22–32)
Calcium: 10 mg/dL (ref 8.9–10.3)
Chloride: 102 mmol/L (ref 98–111)
Creatinine, Ser: 1.03 mg/dL (ref 0.61–1.24)
GFR, Estimated: >60 mL/min
Glucose, Bld: 100 mg/dL — ABNORMAL HIGH (ref 70–99)
Potassium: 4 mmol/L (ref 3.5–5.1)
Sodium: 137 mmol/L (ref 135–145)

## 2024-05-30 LAB — CBC
HCT: 45.5 % (ref 39.0–52.0)
Hemoglobin: 15.8 g/dL (ref 13.0–17.0)
MCH: 29.6 pg (ref 26.0–34.0)
MCHC: 34.7 g/dL (ref 30.0–36.0)
MCV: 85.4 fL (ref 80.0–100.0)
Platelets: 315 K/uL (ref 150–400)
RBC: 5.33 MIL/uL (ref 4.22–5.81)
RDW: 13.7 % (ref 11.5–15.5)
WBC: 10.2 K/uL (ref 4.0–10.5)
nRBC: 0 % (ref 0.0–0.2)

## 2024-05-30 LAB — TROPONIN T, HIGH SENSITIVITY: Troponin T High Sensitivity: <15 ng/L (ref 0–19)

## 2024-05-30 MED ORDER — ALBUTEROL SULFATE (2.5 MG/3ML) 0.083% IN NEBU
5.0000 mg | INHALATION_SOLUTION | Freq: Once | RESPIRATORY_TRACT | Status: AC
  Administered 2024-05-30: 5 mg via RESPIRATORY_TRACT
  Filled 2024-05-30: qty 6

## 2024-05-30 MED ORDER — IOHEXOL 350 MG/ML SOLN
75.0000 mL | Freq: Once | INTRAVENOUS | Status: AC | PRN
  Administered 2024-05-30: 75 mL via INTRAVENOUS

## 2024-05-30 MED ORDER — DIAZEPAM 5 MG/ML IJ SOLN
2.5000 mg | Freq: Once | INTRAMUSCULAR | Status: AC
  Administered 2024-05-30: 2.5 mg via INTRAVENOUS
  Filled 2024-05-30: qty 2

## 2024-05-30 MED ORDER — LORAZEPAM 2 MG/ML IJ SOLN: 1.0000 mg | Freq: Once | INTRAMUSCULAR | Status: DC

## 2024-05-30 MED ORDER — KETOROLAC TROMETHAMINE 30 MG/ML IJ SOLN
15.0000 mg | Freq: Once | INTRAMUSCULAR | Status: AC
  Administered 2024-05-30: 15 mg via INTRAVENOUS
  Filled 2024-05-30: qty 1

## 2024-05-30 NOTE — ED Notes (Signed)
 ED Provider at bedside.

## 2024-05-30 NOTE — ED Triage Notes (Signed)
 Pt reports off and on that started around noon (after eating lunch). Pain in the upper left chest and the pain shoots down the left arm. Pain in the left shoulder. Shortness of breath.

## 2024-05-30 NOTE — ED Provider Notes (Addendum)
 " [place] EMERGENCY DEPARTMENT AT [address] [facility] Provider Note   CSN: [id number] Arrival date & time: 05/30/24  2153     Patient presents with: No chief complaint on file.   Jacob Nixon. is a 34 y.o. male.   34 year old male presents with constant chest pressure since this afternoon.  Symptoms started after he ate lunch.  Feels that there is a pressure on his chest.  Occasionally has sharp pain that shoots down his left arm which waxes and wanes.  States he has been slightly more dyspneic.  Denies any leg pain or swelling.  No prior history of DVT or PE or ACS.  No recent cough or congestion but patient does vape.  No recent fever or chills.  No treatment used for this prior to arrival       Prior to Admission medications  Medication Sig Start Date End Date Taking? Authorizing Provider  busPIRone (BUSPAR) 10 MG tablet Take 10 mg by mouth 2 (two) times daily. Patient not taking: Reported on 04/06/2024 04/01/24   [provider]  escitalopram (LEXAPRO) 10 MG tablet Take 10 mg by mouth daily. Patient not taking: Reported on 04/06/2024 04/01/24   [provider]  fexofenadine (ALLEGRA) 180 MG tablet Take 180 mg by mouth daily as needed for allergies or rhinitis. Patient not taking: Reported on 04/06/2024    [provider]  ketorolac  (TORADOL ) 10 MG tablet Take 1 tablet (10 mg total) by mouth every 6 (six) hours as needed. Patient not taking: Reported on 04/06/2024 12/16/23   Prosperi, Christian H, PA-C  ondansetron  (ZOFRAN ) 4 MG tablet Take 1 tablet (4 mg total) by mouth every 8 (eight) hours as needed for nausea or vomiting. 01/28/24   Bauer, Collin S, PA-C  oxyCODONE  (ROXICODONE ) 5 MG immediate release tablet Take 1 tablet (5 mg total) by mouth every 8 (eight) hours as needed for up to 5 doses for severe pain (pain score 7-10). 04/02/24   Cam Morene ORN, MD  tamsulosin  (FLOMAX ) 0.4 MG CAPS capsule Take 1 capsule (0.4 mg total) by mouth daily.  Until stone passes 04/02/24   Cam Morene ORN, MD    Allergies: Fentanyl , Coconut flavoring agent (non-screening), Penicillins, and Latex    Review of Systems  All other systems reviewed and are negative.   Updated Vital Signs BP 115/83 (BP Location: Right Arm)   Pulse 78   Temp 98.8 F (37.1 C) (Oral)   Resp 18   Ht 1.803 m (5' 11)   Wt 124.7 kg   SpO2 99%   BMI 38.35 kg/m   Physical Exam Vitals and nursing note reviewed.  Constitutional:      General: He is not in acute distress.    Appearance: Normal appearance. He is well-developed. He is not toxic-appearing.  HENT:     Head: Normocephalic and atraumatic.  Eyes:     General: Lids are normal.     Conjunctiva/sclera: Conjunctivae normal.     Pupils: Pupils are equal, round, and reactive to light.  Neck:     Thyroid: No thyroid mass.     Trachea: No tracheal deviation.  Cardiovascular:     Rate and Rhythm: Normal rate and regular rhythm.     Heart sounds: Normal heart sounds. No murmur heard.    No gallop.  Pulmonary:     Effort: Pulmonary effort is normal. No respiratory distress.     Breath sounds: Normal breath sounds. No stridor. No decreased breath sounds, wheezing, rhonchi  or rales.  Abdominal:     General: There is no distension.     Palpations: Abdomen is soft.     Tenderness: There is no abdominal tenderness. There is no rebound.  Musculoskeletal:        General: No tenderness. Normal range of motion.     Cervical back: Normal range of motion and neck supple.  Skin:    General: Skin is warm and dry.     Findings: No abrasion or rash.  Neurological:     Mental Status: He is alert and oriented to person, place, and time. Mental status is at baseline.     GCS: GCS eye subscore is 4. GCS verbal subscore is 5. GCS motor subscore is 6.     Cranial Nerves: No cranial nerve deficit.     Sensory: No sensory deficit.     Motor: Motor function is intact.  Psychiatric:        Attention and Perception:  Attention normal.        Speech: Speech normal.        Behavior: Behavior normal.     (all labs ordered are listed, but only abnormal results are displayed) Labs Reviewed  BASIC METABOLIC PANEL WITH GFR  CBC  TROPONIN T, HIGH SENSITIVITY    EKG: EKG Interpretation Date/Time:  Sunday May 30 2024 21:59:42 EST Ventricular Rate:  81 PR Interval:  173 QRS Duration:  95 QT Interval:  332 QTC Calculation: 386 R Axis:   5  Text Interpretation: Sinus rhythm Low voltage, precordial leads No significant change since last tracing Confirmed by Dasie Faden ([id number]) on 05/30/2024 10:01:09 PM  Radiology: No results found.   Procedures   Medications Ordered in the ED  albuterol  (PROVENTIL ) (2.5 MG/3ML) 0.083% nebulizer solution 5 mg (has no administration in time range)                                    Medical Decision Making Amount and/or Complexity of Data Reviewed Labs: ordered. Radiology: ordered.  Risk Prescription drug management.  Patient's EKG shows sinus rhythm.  No ischemic findings noted Chest x-ray without acute findings here.  CT angio chest negative as well.  Pro troponin negative.  Patient admitted to vaping.  Given albuterol  so that would help.  He does appear somewhat anxious.  Will give dose of IV Valium  as well as Toradol .  Will check second troponin and patient signed out to Dr. geroldine     Final diagnoses:  None    ED Discharge Orders     None          Dasie Faden, MD 05/30/24 2320    Dasie Faden, MD 05/30/24 2320  "

## 2024-05-31 LAB — TROPONIN T, HIGH SENSITIVITY: Troponin T High Sensitivity: <15 ng/L (ref 0–19)

## 2024-05-31 NOTE — ED Provider Notes (Signed)
" °  Physical Exam  BP 97/65   Pulse 73   Temp 98.8 F (37.1 C) (Oral)   Resp 15   Ht 5' 11 (1.803 m)   Wt 124.7 kg   SpO2 94%   BMI 38.35 kg/m   Physical Exam Vitals and nursing note reviewed.  Constitutional:      Appearance: He is well-developed.  HENT:     Head: Normocephalic.  Neurological:     Mental Status: He is alert and oriented to person, place, and time.     Procedures  Procedures  ED Course / MDM    Medical Decision Making Amount and/or Complexity of Data Reviewed Labs: ordered. Radiology: ordered.  Risk Prescription drug management.   Care assumed from Dr. Dasie at shift change.  Patient awaiting results of a repeat troponin which has returned and is normal.  While being discharged, patient expressed some concern about going home.  I returned to the room to discuss the situation with him.  He and another individual who was on speaker phone expressed concerns about him going home while still experiencing symptoms.  I explained that an emergent cause of his discomfort had been ruled out and that it is reasonable to go home with anti-inflammatory medications, rest, and time.  I offered admission, however patient declined.  He tells me he will follow-up with his primary doctor in the morning and arrange a follow-up appointment.  I also offered to refer him to cardiology for consideration of a stress test, however he stated that he would go through his primary doctor to make these arrangements.       Geroldine Berg, MD 05/31/24 0132  "

## 2024-05-31 NOTE — Discharge Instructions (Addendum)
Take ibuprofen 600 mg every 6 hours as needed for pain.  Follow-up with primary doctor if symptoms persist, and return to the ER if symptoms significantly worsen or change.
